# Patient Record
Sex: Female | Born: 1940 | Race: White | Hispanic: No | Smoking: Former smoker
Health system: Southern US, Community
[De-identification: ages and names within clinical notes are randomized; demographics above are authoritative.]

## PROBLEM LIST (undated history)

## (undated) DIAGNOSIS — G4700 Insomnia, unspecified: Secondary | ICD-10-CM

## (undated) DIAGNOSIS — K5792 Diverticulitis of intestine, part unspecified, without perforation or abscess without bleeding: Secondary | ICD-10-CM

## (undated) DIAGNOSIS — K219 Gastro-esophageal reflux disease without esophagitis: Secondary | ICD-10-CM

## (undated) DIAGNOSIS — I214 Non-ST elevation (NSTEMI) myocardial infarction: Secondary | ICD-10-CM

## (undated) DIAGNOSIS — E119 Type 2 diabetes mellitus without complications: Secondary | ICD-10-CM

## (undated) DIAGNOSIS — I878 Other specified disorders of veins: Secondary | ICD-10-CM

## (undated) DIAGNOSIS — K589 Irritable bowel syndrome without diarrhea: Secondary | ICD-10-CM

## (undated) DIAGNOSIS — I2510 Atherosclerotic heart disease of native coronary artery without angina pectoris: Secondary | ICD-10-CM

## (undated) DIAGNOSIS — Z789 Other specified health status: Secondary | ICD-10-CM

## (undated) DIAGNOSIS — Z720 Tobacco use: Secondary | ICD-10-CM

## (undated) DIAGNOSIS — R112 Nausea with vomiting, unspecified: Secondary | ICD-10-CM

## (undated) DIAGNOSIS — F32A Depression, unspecified: Secondary | ICD-10-CM

## (undated) DIAGNOSIS — I4891 Unspecified atrial fibrillation: Secondary | ICD-10-CM

## (undated) DIAGNOSIS — I10 Essential (primary) hypertension: Secondary | ICD-10-CM

## (undated) DIAGNOSIS — R197 Diarrhea, unspecified: Secondary | ICD-10-CM

## (undated) DIAGNOSIS — G4730 Sleep apnea, unspecified: Secondary | ICD-10-CM

## (undated) DIAGNOSIS — E039 Hypothyroidism, unspecified: Secondary | ICD-10-CM

## (undated) DIAGNOSIS — M25562 Pain in left knee: Secondary | ICD-10-CM

## (undated) DIAGNOSIS — R600 Localized edema: Secondary | ICD-10-CM

## (undated) DIAGNOSIS — Z87442 Personal history of urinary calculi: Secondary | ICD-10-CM

## (undated) DIAGNOSIS — R609 Edema, unspecified: Secondary | ICD-10-CM

## (undated) DIAGNOSIS — I2699 Other pulmonary embolism without acute cor pulmonale: Secondary | ICD-10-CM

## (undated) DIAGNOSIS — M1990 Unspecified osteoarthritis, unspecified site: Secondary | ICD-10-CM

## (undated) DIAGNOSIS — Z7901 Long term (current) use of anticoagulants: Secondary | ICD-10-CM

## (undated) DIAGNOSIS — T7840XA Allergy, unspecified, initial encounter: Secondary | ICD-10-CM

## (undated) DIAGNOSIS — Z9889 Other specified postprocedural states: Secondary | ICD-10-CM

## (undated) DIAGNOSIS — E079 Disorder of thyroid, unspecified: Secondary | ICD-10-CM

## (undated) DIAGNOSIS — E785 Hyperlipidemia, unspecified: Secondary | ICD-10-CM

## (undated) DIAGNOSIS — R110 Nausea: Secondary | ICD-10-CM

## (undated) DIAGNOSIS — Z7952 Long term (current) use of systemic steroids: Secondary | ICD-10-CM

## (undated) DIAGNOSIS — K625 Hemorrhage of anus and rectum: Secondary | ICD-10-CM

## (undated) DIAGNOSIS — I219 Acute myocardial infarction, unspecified: Secondary | ICD-10-CM

## (undated) DIAGNOSIS — I480 Paroxysmal atrial fibrillation: Secondary | ICD-10-CM

## (undated) DIAGNOSIS — Z8371 Family history of colonic polyps: Secondary | ICD-10-CM

## (undated) DIAGNOSIS — F329 Major depressive disorder, single episode, unspecified: Secondary | ICD-10-CM

## (undated) DIAGNOSIS — G8929 Other chronic pain: Secondary | ICD-10-CM

## (undated) DIAGNOSIS — Z83719 Family history of colon polyps, unspecified: Secondary | ICD-10-CM

## (undated) HISTORY — DX: Long term (current) use of anticoagulants: Z79.01

## (undated) HISTORY — PX: CHOLECYSTECTOMY: SHX55

## (undated) HISTORY — DX: Localized edema: R60.0

## (undated) HISTORY — DX: Other pulmonary embolism without acute cor pulmonale: I26.99

## (undated) HISTORY — DX: Allergy, unspecified, initial encounter: T78.40XA

## (undated) HISTORY — PX: BREAST SURGERY: SHX581

## (undated) HISTORY — DX: Paroxysmal atrial fibrillation: I48.0

## (undated) HISTORY — PX: ABDOMINAL HYSTERECTOMY: SHX81

## (undated) HISTORY — DX: Family history of colonic polyps: Z83.71

## (undated) HISTORY — DX: Other specified health status: Z78.9

## (undated) HISTORY — PX: BREAST REDUCTION SURGERY: SHX8

## (undated) HISTORY — DX: Hypothyroidism, unspecified: E03.9

## (undated) HISTORY — PX: KNEE SURGERY: SHX244

## (undated) HISTORY — DX: Essential (primary) hypertension: I10

## (undated) HISTORY — DX: Unspecified atrial fibrillation: I48.91

## (undated) HISTORY — PX: APPENDECTOMY: SHX54

## (undated) HISTORY — DX: Diarrhea, unspecified: R19.7

## (undated) HISTORY — DX: Non-ST elevation (NSTEMI) myocardial infarction: I21.4

## (undated) HISTORY — DX: Gastro-esophageal reflux disease without esophagitis: K21.9

## (undated) HISTORY — DX: Irritable bowel syndrome, unspecified: K58.9

## (undated) HISTORY — DX: Other specified disorders of veins: I87.8

## (undated) HISTORY — DX: Tobacco use: Z72.0

## (undated) HISTORY — PX: OOPHORECTOMY: SHX86

## (undated) HISTORY — DX: Edema, unspecified: R60.9

## (undated) HISTORY — DX: Family history of colon polyps, unspecified: Z83.719

## (undated) HISTORY — DX: Hemorrhage of anus and rectum: K62.5

## (undated) HISTORY — DX: Disorder of thyroid, unspecified: E07.9

## (undated) HISTORY — DX: Hyperlipidemia, unspecified: E78.5

## (undated) HISTORY — DX: Atherosclerotic heart disease of native coronary artery without angina pectoris: I25.10

## (undated) HISTORY — DX: Insomnia, unspecified: G47.00

## (undated) HISTORY — DX: Type 2 diabetes mellitus without complications: E11.9

## (undated) HISTORY — PX: TONSILLECTOMY: SUR1361

## (undated) HISTORY — PX: HEEL SPUR SURGERY: SHX665

## (undated) HISTORY — PX: CLEFT PALATE REPAIR: SUR1165

---

## 1998-03-24 ENCOUNTER — Ambulatory Visit: Admission: RE | Admit: 1998-03-24 | Discharge: 1998-03-24 | Payer: Self-pay | Admitting: Family Medicine

## 1998-07-17 ENCOUNTER — Ambulatory Visit: Admission: RE | Admit: 1998-07-17 | Discharge: 1998-07-17 | Payer: Self-pay | Admitting: Family Medicine

## 1998-08-21 ENCOUNTER — Ambulatory Visit (HOSPITAL_BASED_OUTPATIENT_CLINIC_OR_DEPARTMENT_OTHER): Admission: RE | Admit: 1998-08-21 | Discharge: 1998-08-21 | Payer: Self-pay | Admitting: Specialist

## 2000-03-20 ENCOUNTER — Other Ambulatory Visit: Admission: RE | Admit: 2000-03-20 | Discharge: 2000-03-20 | Payer: Self-pay

## 2000-12-25 ENCOUNTER — Encounter: Payer: Self-pay | Admitting: *Deleted

## 2000-12-25 ENCOUNTER — Emergency Department (HOSPITAL_COMMUNITY): Admission: EM | Admit: 2000-12-25 | Discharge: 2000-12-25 | Payer: Self-pay | Admitting: *Deleted

## 2000-12-26 ENCOUNTER — Inpatient Hospital Stay (HOSPITAL_COMMUNITY): Admission: AD | Admit: 2000-12-26 | Discharge: 2000-12-30 | Payer: Self-pay | Admitting: Family Medicine

## 2001-07-08 ENCOUNTER — Encounter: Payer: Self-pay | Admitting: Family Medicine

## 2001-07-08 ENCOUNTER — Ambulatory Visit (HOSPITAL_COMMUNITY): Admission: RE | Admit: 2001-07-08 | Discharge: 2001-07-08 | Payer: Self-pay | Admitting: Family Medicine

## 2001-09-11 ENCOUNTER — Ambulatory Visit (HOSPITAL_COMMUNITY): Admission: RE | Admit: 2001-09-11 | Discharge: 2001-09-11 | Payer: Self-pay | Admitting: Orthopedic Surgery

## 2001-11-02 ENCOUNTER — Encounter (HOSPITAL_COMMUNITY): Admission: RE | Admit: 2001-11-02 | Discharge: 2001-12-02 | Payer: Self-pay | Admitting: Orthopedic Surgery

## 2002-02-26 ENCOUNTER — Encounter: Admission: RE | Admit: 2002-02-26 | Discharge: 2002-02-26 | Payer: Self-pay

## 2002-07-02 ENCOUNTER — Encounter (HOSPITAL_COMMUNITY): Admission: RE | Admit: 2002-07-02 | Discharge: 2002-08-01 | Payer: Self-pay | Admitting: Family Medicine

## 2002-07-05 ENCOUNTER — Encounter: Payer: Self-pay | Admitting: Family Medicine

## 2002-07-09 ENCOUNTER — Inpatient Hospital Stay (HOSPITAL_COMMUNITY): Admission: EM | Admit: 2002-07-09 | Discharge: 2002-07-17 | Payer: Self-pay | Admitting: Emergency Medicine

## 2002-07-09 ENCOUNTER — Encounter: Payer: Self-pay | Admitting: Emergency Medicine

## 2002-09-16 ENCOUNTER — Ambulatory Visit (HOSPITAL_COMMUNITY): Admission: RE | Admit: 2002-09-16 | Discharge: 2002-09-16 | Payer: Self-pay | Admitting: Internal Medicine

## 2002-10-26 ENCOUNTER — Encounter (HOSPITAL_COMMUNITY): Admission: RE | Admit: 2002-10-26 | Discharge: 2002-11-25 | Payer: Self-pay | Admitting: Cardiology

## 2002-11-22 ENCOUNTER — Emergency Department (HOSPITAL_COMMUNITY): Admission: EM | Admit: 2002-11-22 | Discharge: 2002-11-22 | Payer: Self-pay | Admitting: Emergency Medicine

## 2002-11-29 ENCOUNTER — Inpatient Hospital Stay (HOSPITAL_COMMUNITY): Admission: AD | Admit: 2002-11-29 | Discharge: 2002-12-03 | Payer: Self-pay | Admitting: Family Medicine

## 2002-11-29 ENCOUNTER — Encounter: Payer: Self-pay | Admitting: Family Medicine

## 2003-03-04 ENCOUNTER — Encounter: Admission: RE | Admit: 2003-03-04 | Discharge: 2003-03-04 | Payer: Self-pay

## 2003-09-05 ENCOUNTER — Inpatient Hospital Stay (HOSPITAL_COMMUNITY): Admission: EM | Admit: 2003-09-05 | Discharge: 2003-09-09 | Payer: Self-pay | Admitting: Emergency Medicine

## 2003-09-09 ENCOUNTER — Encounter: Payer: Self-pay | Admitting: *Deleted

## 2003-10-23 ENCOUNTER — Inpatient Hospital Stay (HOSPITAL_COMMUNITY): Admission: EM | Admit: 2003-10-23 | Discharge: 2003-10-24 | Payer: Self-pay

## 2003-10-26 ENCOUNTER — Ambulatory Visit (HOSPITAL_COMMUNITY): Admission: RE | Admit: 2003-10-26 | Discharge: 2003-10-26 | Payer: Self-pay | Admitting: *Deleted

## 2003-11-12 ENCOUNTER — Emergency Department (HOSPITAL_COMMUNITY): Admission: EM | Admit: 2003-11-12 | Discharge: 2003-11-12 | Payer: Self-pay | Admitting: Emergency Medicine

## 2003-11-21 ENCOUNTER — Encounter (HOSPITAL_COMMUNITY): Admission: RE | Admit: 2003-11-21 | Discharge: 2003-12-21 | Payer: Self-pay | Admitting: *Deleted

## 2003-12-23 ENCOUNTER — Encounter (HOSPITAL_COMMUNITY): Admission: RE | Admit: 2003-12-23 | Discharge: 2004-01-22 | Payer: Self-pay | Admitting: *Deleted

## 2004-01-23 ENCOUNTER — Encounter (HOSPITAL_COMMUNITY): Admission: RE | Admit: 2004-01-23 | Discharge: 2004-02-22 | Payer: Self-pay | Admitting: *Deleted

## 2004-03-09 ENCOUNTER — Ambulatory Visit (HOSPITAL_COMMUNITY): Admission: RE | Admit: 2004-03-09 | Discharge: 2004-03-09 | Payer: Self-pay | Admitting: Internal Medicine

## 2004-05-03 ENCOUNTER — Emergency Department (HOSPITAL_COMMUNITY): Admission: EM | Admit: 2004-05-03 | Discharge: 2004-05-03 | Payer: Self-pay | Admitting: Emergency Medicine

## 2004-05-07 ENCOUNTER — Ambulatory Visit: Payer: Self-pay | Admitting: *Deleted

## 2004-05-15 ENCOUNTER — Ambulatory Visit: Payer: Self-pay | Admitting: *Deleted

## 2004-05-21 ENCOUNTER — Ambulatory Visit: Payer: Self-pay | Admitting: Internal Medicine

## 2004-05-21 ENCOUNTER — Ambulatory Visit (HOSPITAL_COMMUNITY): Admission: RE | Admit: 2004-05-21 | Discharge: 2004-05-21 | Payer: Self-pay | Admitting: Internal Medicine

## 2004-05-31 ENCOUNTER — Ambulatory Visit: Payer: Self-pay | Admitting: *Deleted

## 2004-06-05 ENCOUNTER — Ambulatory Visit: Payer: Self-pay | Admitting: Cardiology

## 2004-06-08 ENCOUNTER — Ambulatory Visit: Payer: Self-pay | Admitting: Cardiology

## 2004-06-14 ENCOUNTER — Ambulatory Visit: Payer: Self-pay | Admitting: *Deleted

## 2004-06-15 ENCOUNTER — Ambulatory Visit (HOSPITAL_COMMUNITY): Admission: RE | Admit: 2004-06-15 | Discharge: 2004-06-15 | Payer: Self-pay | Admitting: Family Medicine

## 2004-06-28 ENCOUNTER — Ambulatory Visit: Payer: Self-pay | Admitting: *Deleted

## 2004-07-09 ENCOUNTER — Ambulatory Visit: Payer: Self-pay | Admitting: Internal Medicine

## 2004-07-23 ENCOUNTER — Ambulatory Visit: Payer: Self-pay | Admitting: *Deleted

## 2004-08-27 ENCOUNTER — Ambulatory Visit: Payer: Self-pay | Admitting: *Deleted

## 2004-10-01 ENCOUNTER — Ambulatory Visit: Payer: Self-pay | Admitting: *Deleted

## 2004-10-01 ENCOUNTER — Ambulatory Visit (HOSPITAL_COMMUNITY): Admission: RE | Admit: 2004-10-01 | Discharge: 2004-10-01 | Payer: Self-pay | Admitting: Family Medicine

## 2004-10-10 ENCOUNTER — Ambulatory Visit (HOSPITAL_COMMUNITY): Admission: RE | Admit: 2004-10-10 | Discharge: 2004-10-10 | Payer: Self-pay | Admitting: Family Medicine

## 2004-10-15 ENCOUNTER — Ambulatory Visit: Payer: Self-pay | Admitting: Cardiology

## 2004-10-23 ENCOUNTER — Ambulatory Visit: Payer: Self-pay | Admitting: *Deleted

## 2004-10-23 ENCOUNTER — Ambulatory Visit (HOSPITAL_COMMUNITY): Admission: RE | Admit: 2004-10-23 | Discharge: 2004-10-23 | Payer: Self-pay | Admitting: Family Medicine

## 2004-10-29 ENCOUNTER — Ambulatory Visit: Payer: Self-pay | Admitting: *Deleted

## 2004-11-09 ENCOUNTER — Ambulatory Visit: Payer: Self-pay | Admitting: *Deleted

## 2004-11-23 ENCOUNTER — Ambulatory Visit: Payer: Self-pay | Admitting: *Deleted

## 2004-12-21 ENCOUNTER — Ambulatory Visit: Payer: Self-pay | Admitting: Cardiovascular Disease

## 2004-12-27 ENCOUNTER — Ambulatory Visit: Payer: Self-pay | Admitting: *Deleted

## 2005-01-03 ENCOUNTER — Ambulatory Visit: Payer: Self-pay | Admitting: *Deleted

## 2005-01-21 ENCOUNTER — Ambulatory Visit: Payer: Self-pay | Admitting: Cardiology

## 2005-02-12 ENCOUNTER — Ambulatory Visit: Payer: Self-pay | Admitting: Cardiology

## 2005-03-04 ENCOUNTER — Ambulatory Visit: Payer: Self-pay | Admitting: *Deleted

## 2005-03-27 ENCOUNTER — Ambulatory Visit: Payer: Self-pay | Admitting: Cardiology

## 2005-04-24 ENCOUNTER — Ambulatory Visit: Payer: Self-pay | Admitting: *Deleted

## 2005-05-29 ENCOUNTER — Ambulatory Visit: Payer: Self-pay | Admitting: Cardiovascular Disease

## 2005-07-01 ENCOUNTER — Ambulatory Visit: Payer: Self-pay | Admitting: *Deleted

## 2005-07-16 ENCOUNTER — Ambulatory Visit: Payer: Self-pay | Admitting: *Deleted

## 2005-08-05 ENCOUNTER — Ambulatory Visit: Payer: Self-pay | Admitting: *Deleted

## 2005-09-02 ENCOUNTER — Ambulatory Visit: Payer: Self-pay | Admitting: Cardiology

## 2005-09-17 ENCOUNTER — Ambulatory Visit: Payer: Self-pay | Admitting: *Deleted

## 2005-10-21 ENCOUNTER — Ambulatory Visit: Payer: Self-pay | Admitting: Cardiology

## 2005-11-12 ENCOUNTER — Encounter: Admission: RE | Admit: 2005-11-12 | Discharge: 2005-11-12 | Payer: Self-pay | Admitting: Family Medicine

## 2005-11-22 ENCOUNTER — Ambulatory Visit: Payer: Self-pay | Admitting: *Deleted

## 2005-12-23 ENCOUNTER — Ambulatory Visit: Payer: Self-pay | Admitting: *Deleted

## 2006-01-09 ENCOUNTER — Ambulatory Visit: Payer: Self-pay | Admitting: Internal Medicine

## 2006-01-23 ENCOUNTER — Ambulatory Visit: Payer: Self-pay | Admitting: *Deleted

## 2006-02-03 ENCOUNTER — Ambulatory Visit: Payer: Self-pay | Admitting: Internal Medicine

## 2006-02-03 ENCOUNTER — Ambulatory Visit (HOSPITAL_COMMUNITY): Admission: RE | Admit: 2006-02-03 | Discharge: 2006-02-03 | Payer: Self-pay | Admitting: Internal Medicine

## 2006-02-27 ENCOUNTER — Ambulatory Visit: Payer: Self-pay | Admitting: Cardiology

## 2006-03-24 ENCOUNTER — Ambulatory Visit: Payer: Self-pay | Admitting: Cardiology

## 2006-10-21 ENCOUNTER — Emergency Department (HOSPITAL_COMMUNITY): Admission: EM | Admit: 2006-10-21 | Discharge: 2006-10-21 | Payer: Self-pay | Admitting: Emergency Medicine

## 2007-01-16 ENCOUNTER — Emergency Department (HOSPITAL_COMMUNITY): Admission: EM | Admit: 2007-01-16 | Discharge: 2007-01-16 | Payer: Self-pay | Admitting: Emergency Medicine

## 2007-01-23 ENCOUNTER — Ambulatory Visit (HOSPITAL_COMMUNITY): Admission: RE | Admit: 2007-01-23 | Discharge: 2007-01-23 | Payer: Self-pay | Admitting: Family Medicine

## 2007-10-07 ENCOUNTER — Emergency Department (HOSPITAL_COMMUNITY): Admission: EM | Admit: 2007-10-07 | Discharge: 2007-10-07 | Payer: Self-pay | Admitting: Emergency Medicine

## 2007-10-12 ENCOUNTER — Ambulatory Visit: Payer: Self-pay | Admitting: Cardiology

## 2007-10-13 ENCOUNTER — Ambulatory Visit: Admission: RE | Admit: 2007-10-13 | Discharge: 2007-10-13 | Payer: Self-pay | Admitting: Family Medicine

## 2008-01-04 ENCOUNTER — Inpatient Hospital Stay (HOSPITAL_COMMUNITY): Admission: AD | Admit: 2008-01-04 | Discharge: 2008-01-09 | Payer: Self-pay | Admitting: Family Medicine

## 2008-03-12 ENCOUNTER — Inpatient Hospital Stay (HOSPITAL_COMMUNITY): Admission: RE | Admit: 2008-03-12 | Discharge: 2008-03-15 | Payer: Self-pay | Admitting: Family Medicine

## 2008-03-16 ENCOUNTER — Inpatient Hospital Stay (HOSPITAL_COMMUNITY): Admission: EM | Admit: 2008-03-16 | Discharge: 2008-03-17 | Payer: Self-pay | Admitting: Emergency Medicine

## 2008-03-16 ENCOUNTER — Ambulatory Visit: Payer: Self-pay | Admitting: Cardiology

## 2008-03-16 ENCOUNTER — Encounter: Payer: Self-pay | Admitting: Family Medicine

## 2008-03-29 ENCOUNTER — Ambulatory Visit: Payer: Self-pay | Admitting: Cardiology

## 2008-04-25 ENCOUNTER — Ambulatory Visit: Payer: Self-pay | Admitting: Cardiology

## 2008-06-13 ENCOUNTER — Ambulatory Visit: Payer: Self-pay | Admitting: Cardiology

## 2008-06-20 ENCOUNTER — Ambulatory Visit: Payer: Self-pay | Admitting: Cardiology

## 2008-06-21 ENCOUNTER — Ambulatory Visit (HOSPITAL_COMMUNITY): Admission: RE | Admit: 2008-06-21 | Discharge: 2008-06-21 | Payer: Self-pay | Admitting: Cardiology

## 2008-07-18 ENCOUNTER — Ambulatory Visit: Payer: Self-pay | Admitting: Cardiology

## 2008-09-09 ENCOUNTER — Ambulatory Visit (HOSPITAL_COMMUNITY): Admission: RE | Admit: 2008-09-09 | Discharge: 2008-09-09 | Payer: Self-pay | Admitting: Rheumatology

## 2008-09-14 ENCOUNTER — Inpatient Hospital Stay (HOSPITAL_COMMUNITY): Admission: EM | Admit: 2008-09-14 | Discharge: 2008-09-16 | Payer: Self-pay | Admitting: Emergency Medicine

## 2008-09-14 ENCOUNTER — Ambulatory Visit: Payer: Self-pay | Admitting: Cardiology

## 2008-10-03 ENCOUNTER — Ambulatory Visit (HOSPITAL_COMMUNITY): Admission: RE | Admit: 2008-10-03 | Discharge: 2008-10-03 | Payer: Self-pay | Admitting: Rheumatology

## 2008-10-10 ENCOUNTER — Ambulatory Visit: Payer: Self-pay | Admitting: Cardiology

## 2008-10-12 ENCOUNTER — Ambulatory Visit (HOSPITAL_BASED_OUTPATIENT_CLINIC_OR_DEPARTMENT_OTHER): Admission: RE | Admit: 2008-10-12 | Discharge: 2008-10-12 | Payer: Self-pay | Admitting: General Surgery

## 2008-10-12 ENCOUNTER — Encounter (INDEPENDENT_AMBULATORY_CARE_PROVIDER_SITE_OTHER): Payer: Self-pay | Admitting: General Surgery

## 2008-10-17 ENCOUNTER — Inpatient Hospital Stay (HOSPITAL_COMMUNITY): Admission: AD | Admit: 2008-10-17 | Discharge: 2008-10-23 | Payer: Self-pay | Admitting: Family Medicine

## 2008-10-17 ENCOUNTER — Ambulatory Visit: Payer: Self-pay | Admitting: Cardiology

## 2008-10-18 ENCOUNTER — Encounter: Payer: Self-pay | Admitting: Cardiology

## 2008-10-19 ENCOUNTER — Ambulatory Visit: Payer: Self-pay | Admitting: Gastroenterology

## 2008-10-20 ENCOUNTER — Ambulatory Visit: Payer: Self-pay | Admitting: Internal Medicine

## 2008-10-22 ENCOUNTER — Ambulatory Visit: Payer: Self-pay | Admitting: Gastroenterology

## 2008-11-22 ENCOUNTER — Encounter: Payer: Self-pay | Admitting: Gastroenterology

## 2008-12-02 ENCOUNTER — Encounter: Payer: Self-pay | Admitting: Physician Assistant

## 2008-12-02 ENCOUNTER — Ambulatory Visit: Payer: Self-pay | Admitting: Cardiology

## 2008-12-02 DIAGNOSIS — I482 Chronic atrial fibrillation, unspecified: Secondary | ICD-10-CM | POA: Insufficient documentation

## 2008-12-02 DIAGNOSIS — E119 Type 2 diabetes mellitus without complications: Secondary | ICD-10-CM | POA: Insufficient documentation

## 2008-12-02 DIAGNOSIS — I2510 Atherosclerotic heart disease of native coronary artery without angina pectoris: Secondary | ICD-10-CM | POA: Insufficient documentation

## 2008-12-02 DIAGNOSIS — I10 Essential (primary) hypertension: Secondary | ICD-10-CM | POA: Insufficient documentation

## 2008-12-02 DIAGNOSIS — T7840XA Allergy, unspecified, initial encounter: Secondary | ICD-10-CM | POA: Insufficient documentation

## 2008-12-02 DIAGNOSIS — E785 Hyperlipidemia, unspecified: Secondary | ICD-10-CM | POA: Insufficient documentation

## 2008-12-02 DIAGNOSIS — K219 Gastro-esophageal reflux disease without esophagitis: Secondary | ICD-10-CM | POA: Insufficient documentation

## 2008-12-02 DIAGNOSIS — E039 Hypothyroidism, unspecified: Secondary | ICD-10-CM | POA: Insufficient documentation

## 2008-12-02 DIAGNOSIS — Z87891 Personal history of nicotine dependence: Secondary | ICD-10-CM | POA: Insufficient documentation

## 2008-12-02 DIAGNOSIS — J45909 Unspecified asthma, uncomplicated: Secondary | ICD-10-CM | POA: Insufficient documentation

## 2008-12-05 ENCOUNTER — Encounter: Payer: Self-pay | Admitting: Gastroenterology

## 2008-12-05 ENCOUNTER — Ambulatory Visit: Payer: Self-pay | Admitting: Gastroenterology

## 2008-12-05 DIAGNOSIS — R197 Diarrhea, unspecified: Secondary | ICD-10-CM | POA: Insufficient documentation

## 2008-12-05 DIAGNOSIS — D126 Benign neoplasm of colon, unspecified: Secondary | ICD-10-CM | POA: Insufficient documentation

## 2008-12-05 DIAGNOSIS — R109 Unspecified abdominal pain: Secondary | ICD-10-CM | POA: Insufficient documentation

## 2008-12-05 DIAGNOSIS — K222 Esophageal obstruction: Secondary | ICD-10-CM | POA: Insufficient documentation

## 2008-12-05 DIAGNOSIS — K625 Hemorrhage of anus and rectum: Secondary | ICD-10-CM | POA: Insufficient documentation

## 2008-12-07 ENCOUNTER — Ambulatory Visit: Payer: Self-pay | Admitting: Cardiology

## 2008-12-08 ENCOUNTER — Encounter: Payer: Self-pay | Admitting: Gastroenterology

## 2008-12-09 LAB — CONVERTED CEMR LAB
Hemoglobin: 11.6 g/dL — ABNORMAL LOW (ref 12.0–15.0)
Lymphocytes Relative: 16 % (ref 12–46)
Lymphs Abs: 2.1 10*3/uL (ref 0.7–4.0)
Monocytes Absolute: 0.9 10*3/uL (ref 0.1–1.0)
Monocytes Relative: 7 % (ref 3–12)
Neutro Abs: 10.2 10*3/uL — ABNORMAL HIGH (ref 1.7–7.7)
RBC: 4.14 M/uL (ref 3.87–5.11)

## 2008-12-16 ENCOUNTER — Encounter: Payer: Self-pay | Admitting: Gastroenterology

## 2009-03-15 ENCOUNTER — Encounter (INDEPENDENT_AMBULATORY_CARE_PROVIDER_SITE_OTHER): Payer: Self-pay | Admitting: *Deleted

## 2009-03-15 LAB — CONVERTED CEMR LAB
HCT: 39 %
Hemoglobin: 11.4 g/dL
MCV: 92 fL
TSH: 0.866 microintl units/mL
WBC: 13.5 10*3/uL

## 2009-04-07 ENCOUNTER — Ambulatory Visit: Payer: Self-pay | Admitting: Cardiology

## 2009-04-07 ENCOUNTER — Encounter (INDEPENDENT_AMBULATORY_CARE_PROVIDER_SITE_OTHER): Payer: Self-pay | Admitting: *Deleted

## 2009-04-07 DIAGNOSIS — I2699 Other pulmonary embolism without acute cor pulmonale: Secondary | ICD-10-CM | POA: Insufficient documentation

## 2009-04-11 ENCOUNTER — Ambulatory Visit (HOSPITAL_COMMUNITY): Admission: RE | Admit: 2009-04-11 | Discharge: 2009-04-11 | Payer: Self-pay | Admitting: Cardiology

## 2009-04-14 ENCOUNTER — Encounter (INDEPENDENT_AMBULATORY_CARE_PROVIDER_SITE_OTHER): Payer: Self-pay | Admitting: *Deleted

## 2009-04-24 ENCOUNTER — Telehealth: Payer: Self-pay | Admitting: Cardiology

## 2009-04-26 ENCOUNTER — Encounter (HOSPITAL_COMMUNITY): Admission: RE | Admit: 2009-04-26 | Discharge: 2009-05-26 | Payer: Self-pay | Admitting: Oncology

## 2009-04-26 ENCOUNTER — Ambulatory Visit (HOSPITAL_COMMUNITY): Payer: Self-pay | Admitting: Oncology

## 2009-05-03 ENCOUNTER — Encounter: Admission: RE | Admit: 2009-05-03 | Discharge: 2009-05-03 | Payer: Self-pay | Admitting: Oncology

## 2009-06-24 HISTORY — PX: COLONOSCOPY: SHX174

## 2009-06-27 ENCOUNTER — Ambulatory Visit (HOSPITAL_COMMUNITY): Admission: RE | Admit: 2009-06-27 | Discharge: 2009-06-27 | Payer: Self-pay | Admitting: Family Medicine

## 2009-07-05 ENCOUNTER — Encounter (HOSPITAL_COMMUNITY): Admission: RE | Admit: 2009-07-05 | Discharge: 2009-08-04 | Payer: Self-pay | Admitting: Oncology

## 2009-07-06 ENCOUNTER — Ambulatory Visit (HOSPITAL_COMMUNITY): Payer: Self-pay | Admitting: Oncology

## 2009-10-03 ENCOUNTER — Emergency Department (HOSPITAL_COMMUNITY): Admission: EM | Admit: 2009-10-03 | Discharge: 2009-10-03 | Payer: Self-pay | Admitting: Emergency Medicine

## 2009-11-13 ENCOUNTER — Encounter (INDEPENDENT_AMBULATORY_CARE_PROVIDER_SITE_OTHER): Payer: Self-pay | Admitting: *Deleted

## 2009-11-13 LAB — CONVERTED CEMR LAB
AST: 16 units/L
BUN: 18 mg/dL
Calcium: 9.5 mg/dL
Chloride: 103 meq/L
Creatinine, Ser: 0.77 mg/dL
HDL: 61 mg/dL
MCV: 89.1 fL
Platelets: 332 10*3/uL
Triglycerides: 112 mg/dL
WBC: 13.3 10*3/uL

## 2009-12-04 ENCOUNTER — Encounter (INDEPENDENT_AMBULATORY_CARE_PROVIDER_SITE_OTHER): Payer: Self-pay | Admitting: *Deleted

## 2009-12-13 ENCOUNTER — Encounter (INDEPENDENT_AMBULATORY_CARE_PROVIDER_SITE_OTHER): Payer: Self-pay | Admitting: *Deleted

## 2009-12-13 ENCOUNTER — Ambulatory Visit: Payer: Self-pay | Admitting: Cardiology

## 2009-12-14 ENCOUNTER — Encounter: Payer: Self-pay | Admitting: Cardiology

## 2010-01-15 ENCOUNTER — Telehealth (INDEPENDENT_AMBULATORY_CARE_PROVIDER_SITE_OTHER): Payer: Self-pay | Admitting: *Deleted

## 2010-02-13 ENCOUNTER — Encounter (INDEPENDENT_AMBULATORY_CARE_PROVIDER_SITE_OTHER): Payer: Self-pay | Admitting: *Deleted

## 2010-02-13 LAB — CONVERTED CEMR LAB
Eosinophils Relative: 2 %
LDL Cholesterol: 88 mg/dL
Lymphocytes Relative: 22 %
Lymphs Abs: 2.6 10*3/uL
Monocytes Absolute: 0.8 10*3/uL
RBC: 4.55 M/uL
WBC: 11.8 10*3/uL

## 2010-02-14 ENCOUNTER — Encounter (INDEPENDENT_AMBULATORY_CARE_PROVIDER_SITE_OTHER): Payer: Self-pay | Admitting: *Deleted

## 2010-02-23 LAB — CONVERTED CEMR LAB
Basophils Absolute: 0 K/uL
Basophils Relative: 0 %
Cholesterol: 159 mg/dL
Eosinophils Absolute: 0.2 K/uL
Eosinophils Relative: 2 %
HCT: 38.1 %
HDL: 40 mg/dL
Hemoglobin: 11.3 g/dL — ABNORMAL LOW
LDL Cholesterol: 88 mg/dL
Lymphocytes Relative: 22 %
Lymphs Abs: 2.6 K/uL
MCHC: 29.7 g/dL — ABNORMAL LOW
MCV: 83.7 fL
Monocytes Absolute: 0.8 K/uL
Monocytes Relative: 6 %
Neutro Abs: 8.3 K/uL — ABNORMAL HIGH
Neutrophils Relative %: 70 %
Platelets: 470 K/uL — ABNORMAL HIGH
RBC: 4.55 M/uL
RDW: 17.4 % — ABNORMAL HIGH
Total CHOL/HDL Ratio: 4
Triglycerides: 155 mg/dL — ABNORMAL HIGH
VLDL: 31 mg/dL
WBC: 11.8 10*3/microliter — ABNORMAL HIGH

## 2010-06-26 ENCOUNTER — Ambulatory Visit
Admission: RE | Admit: 2010-06-26 | Discharge: 2010-06-26 | Payer: Self-pay | Source: Home / Self Care | Attending: Internal Medicine | Admitting: Internal Medicine

## 2010-07-14 ENCOUNTER — Encounter: Payer: Self-pay | Admitting: *Deleted

## 2010-07-15 ENCOUNTER — Encounter: Payer: Self-pay | Admitting: Family Medicine

## 2010-07-15 ENCOUNTER — Encounter: Payer: Self-pay | Admitting: *Deleted

## 2010-07-24 NOTE — Letter (Signed)
Summary: [facility], general practice at [place]. [address], [place] [address]   Phone: [phone]  Fax: [phone]     Eeva 22, 2013 MRN: [id number]   [facility] [place],   [id number]      YOUR LAB WORK IS DUE   February 12, 2010  Please go to [facility], located across the street from [facility] on the second floor.  Hours are Monday - Friday 7am until 7:30pm         Saturday 8am until 12noon    _X_  DO NOT EAT OR DRINK AFTER MIDNIGHT EVENING PRIOR TO LABWORK  __ YOUR LABWORK IS NOT FASTING --YOU MAY EAT PRIOR TO LABWORK

## 2010-07-24 NOTE — Miscellaneous (Signed)
Summary: labs cbcd,lipids,02/14/2010  Clinical Lists Changes  Observations: Added new observation of LDL: 88 mg/dL (02/13/2010 11:50) Added new observation of HDL: 4.0 mg/dL (02/13/2010 11:50) Added new observation of TRIGLYC TOT: 40 mg/dL (02/13/2010 11:50) Added new observation of CHOLESTEROL: 159 mg/dL (02/13/2010 11:50) Added new observation of ABSOLUTE BAS: 0.0 K/uL (02/13/2010 11:50) Added new observation of BASOPHIL %: 0 % (02/13/2010 11:50) Added new observation of EOS ABSLT: 0.2 K/uL (02/13/2010 11:50) Added new observation of % EOS AUTO: 2 % (02/13/2010 11:50) Added new observation of ABSOLUTE MON: 0.8 K/uL (02/13/2010 11:50) Added new observation of MONOCYTE %: 6 % (02/13/2010 11:50) Added new observation of ABS LYMPHOCY: 2.6 K/uL (02/13/2010 11:50) Added new observation of LYMPHS %: 22 % (02/13/2010 11:50) Added new observation of PLATELETK/UL: 470 K/uL (02/13/2010 11:50) Added new observation of RDW: 17.4 % (02/13/2010 11:50) Added new observation of MCHC RBC: 24.8 g/dL (02/13/2010 11:50) Added new observation of MCV: 83.7 fL (02/13/2010 11:50) Added new observation of HCT: 38.1 % (02/13/2010 11:50) Added new observation of HGB: 11.3 g/dL (02/13/2010 11:50) Added new observation of RBC M/UL: 4.55 M/uL (02/13/2010 11:50) Added new observation of WBC COUNT: 11.8 10*3/microliter (02/13/2010 11:50)

## 2010-07-24 NOTE — Progress Notes (Signed)
Summary: Slow Heart Rate   Phone Note Call from Patient   Caller: Patient Reason for Call: Talk to Nurse Summary of Call: pt states that last night her HR was 55 / pt has been feeling "bad" and very tired wants to sleep all the time / pls return phone call/tg Initial call taken by: [facility],  January 15, 2010 3:50 PM  Follow-up for Phone Call        S: pt hr has been 55,57,59 B: pt has polymyalgia, last seen 12/13/09, stopped vytorin and started pravastatin 40mg  daily A: pt has been feeling listless,tired and wants to sleep all the time     Appended Document: Slow Heart Rate Heart rate of 55 beats per minute is normal and should not cause any symptoms Patient's other symptoms and not clearly related to cardiology issues and would best be managed by her primary care physician.  Jacqulyn Ducking, M.D.  Appended Document: Slow Heart Rate I discussed Dr. Izell Hugo recommendations with pt, verbalized understanding

## 2010-07-24 NOTE — Miscellaneous (Signed)
Summary: carotid 04/11/2009  Clinical Lists Changes  Observations: Added new observation of DOPPLER LEG:  Clinical Data: History of left leg pain.  History of previous deep   venous thrombosis.    LEFT LOWER EXTREMITY VENOUS DUPLEX ULTRASOUND    Technique:  Gray-scale sonography with graded compression, as well   as color Doppler and duplex ultrasound were performed to evaluate   the deep venous system of the lower extremity from the level of the   common femoral vein through the popliteal and proximal calf veins.   Spectral Doppler was utilized to evaluate flow at rest and with   distal augmentation maneuvers.    Comparison:  10/19/2008.    Findings:  Normal compressibility of the common femoral,   superficial femoral, and popliteal veins is demonstrated, as well   as the visualized proximal calf veins.  No filling defects to   suggest DVT on grayscale or color Doppler imaging.  Doppler   waveforms show normal direction of venous flow, normal respiratory   phasicity and response to augmentation. No popliteal Baker's cyst   could be seen posterior to the knee.    IMPRESSION:   No evidence of left lower extremity deep vein thrombosis.  (10/03/2009 8:59) Added new observation of US CAROTID:   Clinical Data: Carotid bruit.  Diabetes, hypertension, coronary   stents.    BILATERAL CAROTID DUPLEX ULTRASOUND    Technique: Pearline Cables scale imaging, color Doppler and duplex ultrasound   was performed of bilateral carotid and vertebral arteries in the   neck.    Comparison: None    Criteria:  Quantification of carotid stenosis is based on velocity   parameters that correlate the residual internal carotid diameter   with NASCET-based stenosis levels, using the diameter of the distal   internal carotid lumen as the denominator for stenosis measurement.    The following velocity measurements were obtained:                     PEAK SYSTOLIC/END DIASTOLIC   RIGHT   ICA:                         68/21cm/sec   CCA:                        [id number]   SYSTOLIC ICA/CCA RATIO:     [id number]   DIASTOLIC ICA/CCA RATIO:    1.39   ECA:                        50/5cm/sec    LEFT   ICA:                        83/25cm/sec   CCA:                        [id number]   SYSTOLIC ICA/CCA RATIO:     [id number]   DIASTOLIC ICA/CCA RATIO:    1.65   ECA:                        56/10cm/sec    Findings:    RIGHT CAROTID ARTERY: There is partially calcified mild plaque at   the origin of the ICA without high-grade stenosis.  Normal wave   forms with no focal aliasing on color Doppler interrogation.  RIGHT VERTEBRAL ARTERY:  Normal flow direction and wave form.    LEFT CAROTID ARTERY: Minimal plaque in the carotid bulb without   stenosis.  Normal wave forms with no focal aliasing on color   Doppler interrogation.    LEFT VERTEBRAL ARTERY:  Normal flow direction and wave form.    IMPRESSION:    1.  Mild bilateral carotid bifurcation plaque resulting in less   than 50% diameter stenosis. The exam does not exclude plaque   ulceration or embolization.  Continued surveillance recommended.  (04/11/2009 8:58)      Carotid Doppler  Procedure date:  04/11/2009  Findings:        Clinical Data: Carotid bruit.  Diabetes, hypertension, coronary   stents.    BILATERAL CAROTID DUPLEX ULTRASOUND    Technique: Pearline Cables scale imaging, color Doppler and duplex ultrasound   was performed of bilateral carotid and vertebral arteries in the   neck.    Comparison: None    Criteria:  Quantification of carotid stenosis is based on velocity   parameters that correlate the residual internal carotid diameter   with NASCET-based stenosis levels, using the diameter of the distal   internal carotid lumen as the denominator for stenosis measurement.    The following velocity measurements were obtained:                     PEAK SYSTOLIC/END DIASTOLIC   RIGHT   ICA:                        68/21cm/sec   CCA:                         [id number]   SYSTOLIC ICA/CCA RATIO:     [id number]   DIASTOLIC ICA/CCA RATIO:    1.39   ECA:                        50/5cm/sec    LEFT   ICA:                        83/25cm/sec   CCA:                        [id number]   SYSTOLIC ICA/CCA RATIO:     [id number]   DIASTOLIC ICA/CCA RATIO:    1.65   ECA:                        56/10cm/sec    Findings:    RIGHT CAROTID ARTERY: There is partially calcified mild plaque at   the origin of the ICA without high-grade stenosis.  Normal wave   forms with no focal aliasing on color Doppler interrogation.    RIGHT VERTEBRAL ARTERY:  Normal flow direction and wave form.    LEFT CAROTID ARTERY: Minimal plaque in the carotid bulb without   stenosis.  Normal wave forms with no focal aliasing on color   Doppler interrogation.    LEFT VERTEBRAL ARTERY:  Normal flow direction and wave form.    IMPRESSION:    1.  Mild bilateral carotid bifurcation plaque resulting in less   than 50% diameter stenosis. The exam does not exclude plaque   ulceration or embolization.  Continued surveillance recommended.   Venous Doppler  Procedure date:  10/03/2009  Findings:  Clinical Data: History of left leg pain.  History of previous deep   venous thrombosis.    LEFT LOWER EXTREMITY VENOUS DUPLEX ULTRASOUND    Technique:  Gray-scale sonography with graded compression, as well   as color Doppler and duplex ultrasound were performed to evaluate   the deep venous system of the lower extremity from the level of the   common femoral vein through the popliteal and proximal calf veins.   Spectral Doppler was utilized to evaluate flow at rest and with   distal augmentation maneuvers.    Comparison:  10/19/2008.    Findings:  Normal compressibility of the common femoral,   superficial femoral, and popliteal veins is demonstrated, as well   as the visualized proximal calf veins.  No filling defects to   suggest DVT on grayscale or color Doppler  imaging.  Doppler   waveforms show normal direction of venous flow, normal respiratory   phasicity and response to augmentation. No popliteal Baker's cyst   could be seen posterior to the knee.    IMPRESSION:   No evidence of left lower extremity deep vein thrombosis.

## 2010-07-24 NOTE — Assessment & Plan Note (Signed)
Summary: 8 mth f/u per checkout on 04/07/09/tg  Medications Added FUROSEMIDE 40 MG TABS (FUROSEMIDE) take 1 tab daily PREDNISONE 10 MG TABS (PREDNISONE) take 1 tab daily PRAVASTATIN SODIUM 40 MG TABS (PRAVASTATIN SODIUM) Take one tablet by mouth daily at bedtime      Allergies Added: NKDA  Visit Type:  Follow-up Primary Provider:  Mickie Hillier   History of Present Illness: [name] returns to the office as scheduled for continued assessment and treatment of paroxysmal atrial fibrillation, hypertension and hyperlipidemia and known coronary artery disease, having required percutaneous intervention for single vessel disease following myocardial infarction some years ago.  She has done well symptomatically, experiencing no chest discomfort, no dyspnea on exertion, no orthopnea and no PND.  She has occasional minor palpitations, particularly at night after retiring to bed.  She complains of chronic fatigue and sleep disturbance.  EKG  Procedure date:  12/13/2009  Findings:      Rhythm Strip      Probable sinus rhythm with sinus arrhythmia and perhaps PACs.  EKG: normal sinus rhythm; right ventricular conduction delay; delayed R wave progression-cannot exclude previous septal myocardial infarction; otherwise unremarkable.  Comparison with prior tracing of 12/02/08, no significant interval change.  Colonoscopy  Procedure date:  2011  Findings:      Patient reports no pathologic findings.  She underwent upper endoscopy at the same time and required esophageal dilatation.   Current Medications (verified): 1)  Levothroid 50 Mcg Tabs (Levothyroxine Sodium) .... Once Daily 2)  Singulair 10 Mg Tabs (Montelukast Sodium) .... Once Daily 3)  Diltiazem Hcl Er Beads 300 Mg Xr24h-Cap (Diltiazem Hcl Er Beads) .Marland Kitchen.. 1 Tab Daily 4)  Digoxin 0.25 Mg Tabs (Digoxin) .Marland Kitchen.. 1 Tab Daily 5)  Furosemide 40 Mg Tabs (Furosemide) .... Take 1 Tab Daily 6)  Coumadin 5 Mg Tabs (Warfarin Sodium) .... 5mg   Sat/mon, 2.5mg  Other Days 7)  Diovan 320 Mg Tabs (Valsartan) .Marland Kitchen.. 1 Daily 8)  Vitamin D 400 Unit Tabs (Cholecalciferol) .... Weekly 9)  Prednisone 10 Mg Tabs (Prednisone) .... Take 1 Tab Daily 10)  Flovent Hfa 44 Mcg/act Aero (Fluticasone Propionate  Hfa) .... 2 Puffs Two Times A Day 11)  Potassium Chloride 20 Meq/68ml (10%) Liqd (Potassium Chloride) .Marland Kitchen.. 1 Tab Daily 12)  Pravastatin Sodium 40 Mg Tabs (Pravastatin Sodium) .... Take One Tablet By Mouth Daily At Bedtime  Allergies (verified): No Known Drug Allergies  Past History:  PMH, FH, and Social History reviewed and updated.  Past Medical History: Paroxysmal FIBRILLATION, ATRIAL (ICD-427.31): Normal LV function; episodes occurred in 2005 and 09/2007 ASCVD: Stent to the mid and proximal left anterior descending in 06/2002; Drug-eluting stent placed         in the second diagonal in 08/2003 after a non-ST elevation myocardial infarction TOBACCO USE, QUIT (ICD-V15.82) HYPERLIPIDEMIA (ICD-272.4) Pulmonary embolism-2000 and 09/2008 Gastroesophageal reflux disease; h/o Schatzki's ring and dysphagia; + esophageal dilatation,most recently in '11 AODM-no insulin HYPERTENSION (ICD-401.9) Polymyalgia rheumatica-2010 HYPOTHYROIDISM (ICD-244.9) ALLERGY (ICD-995.3) ASTHMA (ICD-493.90) COLONIC POLYPS, ADENOMATOUS (ICD-211.3) COUMADIN THERAPY (ICD-V58.61) DIARRHEA, ACUTE (ICD-787.91) ABDOMINAL PAIN, LOWER (ICD-789.09) RECTAL BLEEDING (ICD-569.3) Pedal edema-minor  Review of Systems       See HPI.  Vital Signs:  Patient profile:   70 year old female Weight:      253 pounds BMI:     44.98 Pulse rate:   70 / minute BP sitting:   129 / 65  (right arm)  Vitals Entered By: Doretha Sou, CNA (Rayleen 22, 2011 1:07 PM)  Physical Exam  General:   General-Well developed; no acute distress; obese   Neck-No JVD; no carotid bruits: Lungs-No tachypnea, no rales; no rhonchi; no wheezes; decreased breath sounds at the bases Cardiovascular-normal  PMI; distant S1 and S2; minimal systolic ejection murmur Abdomen-BS normal; soft and non-tender without masses or organomegaly:  Neurologic-Normal cranial nerves; symmetric strength and tone:  Skin-Warm, no significant lesions: Extremities-1-2+ edema restricted to his feet and  ankles; normal distal pulses     Impression & Recommendations:  Problem # 1:  CAROTID BRUIT-BILATERAL (ICD-785.9) Carotid ultrasound shows mild plaque without significant focal disease.  Management of vascular risk factors will be pursued.  Continuing full anticoagulation may reduce the likelihood of a TIA or CVA.  Problem # 2:  HYPERTENSION (ICD-401.9) Blood pressure control is excellent; current medications will be continued.  Problem # 3:  FIBRILLATION, ATRIAL (ICD-427.31) No definite clinical evidence for recurrent atrial fibrillation.  Rate control medication and anticoagulation will be continued.  CBC and stool for Hemoccult testing will be obtained to monitor chronic anticoagulation.  Other Orders: EKG w/ Interpretation (93000) Hemoccult Cards (Take Home) (Hemoccult Cards) Future Orders: T-CBC w/Diff [id number]) ... 02/12/2010 T-Lipid Profile [id number]) ... 02/12/2010  Patient Instructions: 1)  Your physician recommends that you schedule a follow-up appointment in: [place] 2)  Your physician recommends that you return for lab work in: 2 MONTHS 3)  Your physician has recommended you make the following change in your medication: STOP VYTORIN, START PRAVASTATIN 40MG  DAILY 4)  Your physician has asked that you test your stool for blood. It is necessary to test 3 different stool specimens for accuracy. You will be given 3 hemoccult cards for specimen collection. For each stool specimen, place a small portion of stool sample (from 2 different areas of the stool) into the 2 squares on the card. Close card. Repeat with 2 more stool specimens. Bring the cards back to the office for  testing. Prescriptions: PRAVASTATIN SODIUM 40 MG TABS (PRAVASTATIN SODIUM) Take one tablet by mouth daily at bedtime  #30 x 3   Entered by:   Tye Savoy RN   Authorized by:   Yehuda Savannah, MD, [facility]   Signed by:   Tye Savoy RN on 12/13/2009   Method used:   Electronically to        [facility]* (retail)       [address]       [place], [place]  [address]       Ph: [phone]       Fax: [phone]   RxID:   [id number]

## 2010-07-24 NOTE — Miscellaneous (Signed)
Summary: LABS CBCD,BMP,LIPIDS,LIVER,A1C,11/13/2009  Clinical Lists Changes  Observations: Added new observation of CALCIUM: 9.5 mg/dL (11/13/2009 11:51) Added new observation of ALBUMIN: 3.7 g/dL (11/13/2009 11:51) Added new observation of PROTEIN, TOT: 6.4 g/dL (11/13/2009 11:51) Added new observation of SGPT (ALT): 22 units/L (11/13/2009 11:51) Added new observation of SGOT (AST): 16 units/L (11/13/2009 11:51) Added new observation of ALK PHOS: 61 units/L (11/13/2009 11:51) Added new observation of BILI DIRECT: 0.1 mg/dL (11/13/2009 11:51) Added new observation of CREATININE: 0.77 mg/dL (11/13/2009 11:51) Added new observation of BUN: 18 mg/dL (11/13/2009 11:51) Added new observation of BG RANDOM: 106 mg/dL (11/13/2009 11:51) Added new observation of CO2 PLSM/SER: 27 meq/L (11/13/2009 11:51) Added new observation of CL SERUM: 103 meq/L (11/13/2009 11:51) Added new observation of K SERUM: 4.0 meq/L (11/13/2009 11:51) Added new observation of NA: 143 meq/L (11/13/2009 11:51) Added new observation of LDL: 62 mg/dL (11/13/2009 11:51) Added new observation of HDL: 61 mg/dL (11/13/2009 11:51) Added new observation of TRIGLYC TOT: 112 mg/dL (11/13/2009 11:51) Added new observation of CHOLESTEROL: 145 mg/dL (11/13/2009 11:51) Added new observation of PLATELETK/UL: 332 K/uL (11/13/2009 11:51) Added new observation of MCV: 89.1 fL (11/13/2009 11:51) Added new observation of HCT: 39.4 % (11/13/2009 11:51) Added new observation of HGB: 11.5 g/dL (11/13/2009 11:51) Added new observation of WBC COUNT: 13.3 10*3/microliter (11/13/2009 11:51) Added new observation of HGBA1C: 7.6 % (11/13/2009 11:51)

## 2010-08-20 ENCOUNTER — Ambulatory Visit (INDEPENDENT_AMBULATORY_CARE_PROVIDER_SITE_OTHER): Payer: Medicare Other | Admitting: Internal Medicine

## 2010-08-20 DIAGNOSIS — K219 Gastro-esophageal reflux disease without esophagitis: Secondary | ICD-10-CM

## 2010-08-20 DIAGNOSIS — K589 Irritable bowel syndrome without diarrhea: Secondary | ICD-10-CM

## 2010-09-08 LAB — DIFFERENTIAL
Basophils Absolute: 0 10*3/uL (ref 0.0–0.1)
Basophils Relative: 0 % (ref 0–1)
Lymphocytes Relative: 16 % (ref 12–46)
Monocytes Relative: 7 % (ref 3–12)
Neutro Abs: 10.9 10*3/uL — ABNORMAL HIGH (ref 1.7–7.7)
Neutrophils Relative %: 76 % (ref 43–77)

## 2010-09-08 LAB — CBC
HCT: 38 % (ref 36.0–46.0)
Hemoglobin: 11.9 g/dL — ABNORMAL LOW (ref 12.0–15.0)
Platelets: 341 10*3/uL (ref 150–400)
WBC: 14.3 10*3/uL — ABNORMAL HIGH (ref 4.0–10.5)

## 2010-09-12 LAB — CBC
HCT: 34.7 % — ABNORMAL LOW (ref 36.0–46.0)
Hemoglobin: 11.4 g/dL — ABNORMAL LOW (ref 12.0–15.0)
MCV: 85.6 fL (ref 78.0–100.0)
Platelets: 294 10*3/uL (ref 150–400)
WBC: 12.4 10*3/uL — ABNORMAL HIGH (ref 4.0–10.5)

## 2010-09-12 LAB — PROTIME-INR
INR: 2.29 — ABNORMAL HIGH (ref 0.00–1.49)
Prothrombin Time: 25 seconds — ABNORMAL HIGH (ref 11.6–15.2)

## 2010-09-12 LAB — DIFFERENTIAL
Basophils Relative: 0 % (ref 0–1)
Eosinophils Relative: 0 % (ref 0–5)
Lymphocytes Relative: 9 % — ABNORMAL LOW (ref 12–46)
Lymphs Abs: 1.1 10*3/uL (ref 0.7–4.0)
Monocytes Relative: 3 % (ref 3–12)
Neutro Abs: 10.9 10*3/uL — ABNORMAL HIGH (ref 1.7–7.7)

## 2010-09-12 LAB — BASIC METABOLIC PANEL
BUN: 14 mg/dL (ref 6–23)
Chloride: 104 mEq/L (ref 96–112)
Potassium: 3.6 mEq/L (ref 3.5–5.1)
Sodium: 142 mEq/L (ref 135–145)

## 2010-09-26 LAB — DIFFERENTIAL
Basophils Absolute: 0.1 10*3/uL (ref 0.0–0.1)
Eosinophils Relative: 1 % (ref 0–5)
Lymphocytes Relative: 16 % (ref 12–46)
Lymphs Abs: 2.1 10*3/uL (ref 0.7–4.0)
Monocytes Absolute: 0.5 10*3/uL (ref 0.1–1.0)
Neutro Abs: 10.4 10*3/uL — ABNORMAL HIGH (ref 1.7–7.7)

## 2010-09-26 LAB — CBC
HCT: 36.1 % (ref 36.0–46.0)
Hemoglobin: 11.7 g/dL — ABNORMAL LOW (ref 12.0–15.0)
RBC: 4.27 MIL/uL (ref 3.87–5.11)
WBC: 13.1 10*3/uL — ABNORMAL HIGH (ref 4.0–10.5)

## 2010-10-02 ENCOUNTER — Encounter: Payer: Self-pay | Admitting: Cardiology

## 2010-10-02 ENCOUNTER — Other Ambulatory Visit: Payer: Self-pay | Admitting: *Deleted

## 2010-10-02 ENCOUNTER — Ambulatory Visit (INDEPENDENT_AMBULATORY_CARE_PROVIDER_SITE_OTHER): Payer: Medicare Other | Admitting: Cardiology

## 2010-10-02 DIAGNOSIS — R259 Unspecified abnormal involuntary movements: Secondary | ICD-10-CM

## 2010-10-02 DIAGNOSIS — G609 Hereditary and idiopathic neuropathy, unspecified: Secondary | ICD-10-CM

## 2010-10-02 DIAGNOSIS — E785 Hyperlipidemia, unspecified: Secondary | ICD-10-CM

## 2010-10-02 DIAGNOSIS — I2699 Other pulmonary embolism without acute cor pulmonale: Secondary | ICD-10-CM

## 2010-10-02 DIAGNOSIS — G629 Polyneuropathy, unspecified: Secondary | ICD-10-CM

## 2010-10-02 DIAGNOSIS — I2510 Atherosclerotic heart disease of native coronary artery without angina pectoris: Secondary | ICD-10-CM

## 2010-10-02 DIAGNOSIS — R251 Tremor, unspecified: Secondary | ICD-10-CM

## 2010-10-02 DIAGNOSIS — Z7901 Long term (current) use of anticoagulants: Secondary | ICD-10-CM

## 2010-10-02 DIAGNOSIS — I4891 Unspecified atrial fibrillation: Secondary | ICD-10-CM

## 2010-10-02 DIAGNOSIS — I10 Essential (primary) hypertension: Secondary | ICD-10-CM

## 2010-10-02 LAB — BLOOD GAS, ARTERIAL
Acid-Base Excess: 6.5 mmol/L — ABNORMAL HIGH (ref 0.0–2.0)
FIO2: 0.21 %
O2 Saturation: 95.1 %
Patient temperature: 37
pCO2 arterial: 50.7 mmHg — ABNORMAL HIGH (ref 35.0–45.0)
pO2, Arterial: 79.1 mmHg — ABNORMAL LOW (ref 80.0–100.0)

## 2010-10-02 LAB — GLUCOSE, CAPILLARY
Glucose-Capillary: 113 mg/dL — ABNORMAL HIGH (ref 70–99)
Glucose-Capillary: 115 mg/dL — ABNORMAL HIGH (ref 70–99)

## 2010-10-02 LAB — PROTIME-INR: Prothrombin Time: 36.7 seconds — ABNORMAL HIGH (ref 11.6–15.2)

## 2010-10-02 MED ORDER — WARFARIN SODIUM 5 MG PO TABS: ORAL_TABLET | ORAL | Status: DC

## 2010-10-02 NOTE — Progress Notes (Signed)
HPI :  [name] returns to the office as scheduled for continued assessment and treatment of rare episodes of atrial fibrillation, coronary artery disease, cardiovascular risk factors and a history of pulmonary embolism requiring anticoagulation.  Since her last visit, she is doing extremely well from a cardiac standpoint.  Exercise tolerance is good with no orthopnea, PND, pedal edema, syncope, chest discomfort or dyspnea.  She has experienced increased myalgias prompting initiation of treatment with prednisone.  Current dosage is 20 mg with continuing tapering planned.  Patient has noted a gradual increase in tremor during the past 2 years.  She also has developed numbness in the left lower extremity and a sensation that she cannot keep her balance, although she has not fallen.  Current Outpatient Prescriptions on File Prior to Visit  Medication Sig Dispense Refill  . Cholecalciferol (VITAMIN D) 400 UNITS capsule Take 400 Units by mouth once a week.        . diltiazem (CARDIZEM CD) 300 MG 24 hr capsule Take 300 mg by mouth daily.        . fluticasone (FLOVENT HFA) 44 MCG/ACT inhaler Inhale 1 puff into the lungs 2 (two) times daily.        . Levothyroxine Sodium 50 MCG CAPS Take 1 capsule by mouth daily.        . montelukast (SINGULAIR) 10 MG tablet Take 10 mg by mouth at bedtime.        . potassium chloride SA (K-DUR,KLOR-CON) 20 MEQ tablet Take 20 mEq by mouth daily.       . valsartan (DIOVAN) 320 MG tablet Take 320 mg by mouth daily.        Marland Kitchen DISCONTD: digoxin (LANOXIN) 0.25 MG tablet Take 250 mcg by mouth daily.        Marland Kitchen DISCONTD: furosemide (LASIX) 40 MG tablet Take 80 mg by mouth daily.       Marland Kitchen DISCONTD: pravastatin (PRAVACHOL) 40 MG tablet Take 40 mg by mouth at bedtime.        Marland Kitchen DISCONTD: predniSONE (DELTASONE) 10 MG tablet Take 20 mg by mouth daily.        Marland Kitchen DISCONTD: warfarin (COUMADIN) 5 MG tablet Take 5 mg by mouth daily. As directed per coumadin clinic          No Known Allergies     Past medical history, social history, and family history reviewed and updated.  ROS: General: no anorexia, weight gain or weight loss Cardiac: no chest pain, dyspnea, orthopnea, PND,  or syncope Respiratory: no cough, sputum production or hemoptysis GI: no nausea, abdominal pain, emesis, diarrhea or constipation Integument: no significant lesions Neurologic: No muscle weakness or paralysis; no speech disturbance; no headache   PHYSICAL EXAM: BP 138/55  Pulse 74  Ht 5\' 3"  (1.6 m)  Wt 246 lb (111.585 kg)  BMI 43.58 kg/m2  SpO2 93% Weight decreased 7 pounds since previous visit General-Well developed; no acute distress Body habitus-obese Neck-No JVD; no carotid bruits Lungs-clear lung fields; resonant to percussion Cardiovascular-normal PMI; normal S1 and S2; modest systolic murmur Abdomen-normal bowel sounds; soft and non-tender without masses or organomegaly Musculoskeletal-No deformities, no cyanosis or clubbing Neurologic-Normal cranial nerves; symmetric strength and tone; sensation to pin and light touch intact in both lower extremities, but vibration sense is markedly decreased on the left Skin-Warm, no significant lesions Extremities-distal pulses intact; trace edema  ASSESSMENT AND PLAN:

## 2010-10-02 NOTE — Assessment & Plan Note (Signed)
Patient has had no symptoms to suggest myocardial ischemia for the past 6 years.

## 2010-10-02 NOTE — Assessment & Plan Note (Signed)
Episodes of atrial fibrillation have been widely separated and well tolerated.  Patient experiences no symptoms suggesting arrhythmia in between clearly documented spells.  Digoxin will be discontinued, as there is likely little benefit and medical regime is complex.  Full anticoagulation is required for her history of pulmonary embolism, minimizing risk of thromboembolism from a cardiac source.

## 2010-10-02 NOTE — Assessment & Plan Note (Signed)
Patient is concerned about both her tremor and the sensory defect in her left foot.  Situation discussed with Dr. Wolfgang Phoenix.  We agree that neurologic consultation would be helpful.

## 2010-10-02 NOTE — Assessment & Plan Note (Addendum)
Control of hypertension is excellent with current medications, which will be continued.

## 2010-10-02 NOTE — Assessment & Plan Note (Addendum)
With known coronary disease, efforts to control hyperlipidemia should be vigorous. Lipid profile from 8 months ago showed very good control.

## 2010-10-02 NOTE — Assessment & Plan Note (Signed)
With a history of unprovoked pulmonary embolism, full anticoagulation will be continued unless risk should exceed benefit in the future.

## 2010-10-02 NOTE — Patient Instructions (Addendum)
Your physician recommends that you schedule a follow-up appointment in:10 months Your physician has recommended you make the following change in your medication: discontinue digoxin, change warfarin to 2.5mg  on Tuesdays and Fridays and 1 tablet all other days Your physician recommends that you return for lab work in: today along with stool cards instructions inside of stool card packet

## 2010-10-03 LAB — DIFFERENTIAL
Basophils Absolute: 0 10*3/uL (ref 0.0–0.1)
Basophils Relative: 0 % (ref 0–1)
Eosinophils Absolute: 0 10*3/uL (ref 0.0–0.7)
Eosinophils Absolute: 0.1 10*3/uL (ref 0.0–0.7)
Eosinophils Relative: 0 % (ref 0–5)
Lymphocytes Relative: 26 % (ref 12–46)
Lymphs Abs: 3 10*3/uL (ref 0.7–4.0)
Lymphs Abs: 3.5 10*3/uL (ref 0.7–4.0)
Monocytes Relative: 5 % (ref 3–12)
Neutro Abs: 8.4 10*3/uL — ABNORMAL HIGH (ref 1.7–7.7)
Neutrophils Relative %: 69 % (ref 43–77)
Neutrophils Relative %: 69 % (ref 43–77)

## 2010-10-03 LAB — CARDIAC PANEL(CRET KIN+CKTOT+MB+TROPI)
CK, MB: 2.3 ng/mL (ref 0.3–4.0)
Relative Index: INVALID (ref 0.0–2.5)
Relative Index: INVALID (ref 0.0–2.5)
Total CK: 35 U/L (ref 7–177)
Troponin I: 0.03 ng/mL (ref 0.00–0.06)

## 2010-10-03 LAB — HEPATIC FUNCTION PANEL
ALT: 30 U/L (ref 0–35)
Albumin: 3.1 g/dL — ABNORMAL LOW (ref 3.5–5.2)
Indirect Bilirubin: 0.5 mg/dL (ref 0.3–0.9)
Total Protein: 5.7 g/dL — ABNORMAL LOW (ref 6.0–8.3)

## 2010-10-03 LAB — CBC
HCT: 38.2 % (ref 36.0–46.0)
MCV: 84.4 fL (ref 78.0–100.0)
Platelets: 170 10*3/uL (ref 150–400)
Platelets: 227 10*3/uL (ref 150–400)
RBC: 4.13 MIL/uL (ref 3.87–5.11)
RDW: 19.1 % — ABNORMAL HIGH (ref 11.5–15.5)
WBC: 12.1 10*3/uL — ABNORMAL HIGH (ref 4.0–10.5)
WBC: 13.3 10*3/uL — ABNORMAL HIGH (ref 4.0–10.5)

## 2010-10-03 LAB — BASIC METABOLIC PANEL
BUN: 27 mg/dL — ABNORMAL HIGH (ref 6–23)
CO2: 31 mEq/L (ref 19–32)
Calcium: 9.4 mg/dL (ref 8.4–10.5)
Chloride: 106 mEq/L (ref 96–112)
Creatinine, Ser: 0.9 mg/dL (ref 0.4–1.2)
Creatinine, Ser: 1.06 mg/dL (ref 0.4–1.2)
GFR calc Af Amer: >60 mL/min (ref >60)
GFR calc non Af Amer: 51 mL/min — ABNORMAL LOW (ref >60)
Glucose, Bld: 134 mg/dL — ABNORMAL HIGH (ref 70–99)
Potassium: 3.3 mEq/L — ABNORMAL LOW (ref 3.5–5.1)
Sodium: 143 mEq/L (ref 135–145)

## 2010-10-03 LAB — GLUCOSE, CAPILLARY
Glucose-Capillary: 165 mg/dL — ABNORMAL HIGH (ref 70–99)
Glucose-Capillary: 168 mg/dL — ABNORMAL HIGH (ref 70–99)
Glucose-Capillary: 37 mg/dL — CL (ref 70–99)

## 2010-10-03 LAB — PROTIME-INR
INR: 1.1 (ref 0.00–1.49)
INR: 1.8 — ABNORMAL HIGH (ref 0.00–1.49)
Prothrombin Time: 14.3 seconds (ref 11.6–15.2)
Prothrombin Time: 22.3 seconds — ABNORMAL HIGH (ref 11.6–15.2)

## 2010-10-03 LAB — D-DIMER, QUANTITATIVE: D-Dimer, Quant: 3.56 ug/mL-FEU — ABNORMAL HIGH (ref 0.00–0.48)

## 2010-10-03 LAB — APTT: aPTT: 35 seconds (ref 24–37)

## 2010-10-03 LAB — DIGOXIN LEVEL: Digoxin Level: 1.3 ng/mL (ref 0.8–2.0)

## 2010-10-03 LAB — BRAIN NATRIURETIC PEPTIDE: Pro B Natriuretic peptide (BNP): 50.6 pg/mL (ref 0.0–100.0)

## 2010-10-04 LAB — DIGOXIN LEVEL: Digoxin Level: 0.4 ng/mL — ABNORMAL LOW (ref 0.8–2.0)

## 2010-10-04 LAB — DIFFERENTIAL
Basophils Absolute: 0 10*3/uL (ref 0.0–0.1)
Lymphocytes Relative: 10 % — ABNORMAL LOW (ref 12–46)
Lymphs Abs: 1.4 10*3/uL (ref 0.7–4.0)
Neutro Abs: 12.3 10*3/uL — ABNORMAL HIGH (ref 1.7–7.7)

## 2010-10-04 LAB — PROTIME-INR
INR: 1 (ref 0.00–1.49)
Prothrombin Time: 13.7 seconds (ref 11.6–15.2)

## 2010-10-04 LAB — BASIC METABOLIC PANEL
BUN: 20 mg/dL (ref 6–23)
Calcium: 8.8 mg/dL (ref 8.4–10.5)
GFR calc non Af Amer: >60 mL/min (ref >60)
Glucose, Bld: 126 mg/dL — ABNORMAL HIGH (ref 70–99)
Potassium: 4 mEq/L (ref 3.5–5.1)
Sodium: 142 mEq/L (ref 135–145)

## 2010-10-04 LAB — GLUCOSE, CAPILLARY
Glucose-Capillary: 111 mg/dL — ABNORMAL HIGH (ref 70–99)
Glucose-Capillary: 145 mg/dL — ABNORMAL HIGH (ref 70–99)
Glucose-Capillary: <10 mg/dL — CL (ref 70–99)

## 2010-10-04 LAB — LIPID PANEL
HDL: 58 mg/dL (ref >39)
LDL Cholesterol: 61 mg/dL (ref 0–99)
Triglycerides: 132 mg/dL (ref <150)
VLDL: 26 mg/dL (ref 0–40)

## 2010-10-04 LAB — POCT CARDIAC MARKERS: Myoglobin, poc: 66.4 ng/mL (ref 12–200)

## 2010-10-04 LAB — CARDIAC PANEL(CRET KIN+CKTOT+MB+TROPI)
CK, MB: 2 ng/mL (ref 0.3–4.0)
CK, MB: 2.2 ng/mL (ref 0.3–4.0)
Total CK: 26 U/L (ref 7–177)
Troponin I: 0.01 ng/mL (ref 0.00–0.06)
Troponin I: 0.01 ng/mL (ref 0.00–0.06)

## 2010-10-04 LAB — CBC
Platelets: 309 10*3/uL (ref 150–400)
RDW: 18.6 % — ABNORMAL HIGH (ref 11.5–15.5)
WBC: 14.1 10*3/uL — ABNORMAL HIGH (ref 4.0–10.5)

## 2010-10-04 LAB — TSH: TSH: 0.747 u[IU]/mL (ref 0.350–4.500)

## 2010-10-04 LAB — T4, FREE: Free T4: 1.07 ng/dL (ref 0.89–1.80)

## 2010-10-04 LAB — BRAIN NATRIURETIC PEPTIDE: Pro B Natriuretic peptide (BNP): 110 pg/mL — ABNORMAL HIGH (ref 0.0–100.0)

## 2010-10-06 LAB — CBC
HCT: 36.8 % (ref 36.0–46.0)
Hemoglobin: 10.9 g/dL — ABNORMAL LOW (ref 12.0–15.0)
MCH: 24.7 pg — ABNORMAL LOW (ref 26.0–34.0)
MCHC: 29.6 g/dL — ABNORMAL LOW (ref 30.0–36.0)
MCV: 83.4 fL (ref 78.0–100.0)
RDW: 19.5 % — ABNORMAL HIGH (ref 11.5–15.5)

## 2010-10-06 LAB — BASIC METABOLIC PANEL
CO2: 30 mEq/L (ref 19–32)
Calcium: 9.1 mg/dL (ref 8.4–10.5)
Chloride: 104 mEq/L (ref 96–112)
Glucose, Bld: 92 mg/dL (ref 70–99)
Sodium: 145 mEq/L (ref 135–145)

## 2010-10-08 ENCOUNTER — Other Ambulatory Visit: Payer: Self-pay | Admitting: Cardiology

## 2010-10-17 ENCOUNTER — Telehealth: Payer: Self-pay | Admitting: Cardiology

## 2010-10-17 NOTE — Telephone Encounter (Signed)
Patient made aware of appointment with Dr.Love on 11/12/10 / tg

## 2010-10-23 ENCOUNTER — Other Ambulatory Visit: Payer: Self-pay | Admitting: Family Medicine

## 2010-10-23 DIAGNOSIS — Z139 Encounter for screening, unspecified: Secondary | ICD-10-CM

## 2010-10-23 DIAGNOSIS — IMO0002 Reserved for concepts with insufficient information to code with codable children: Secondary | ICD-10-CM

## 2010-10-30 ENCOUNTER — Ambulatory Visit (HOSPITAL_COMMUNITY)
Admission: RE | Admit: 2010-10-30 | Discharge: 2010-10-30 | Disposition: A | Discharge status: Home / Self Care | Payer: Medicare Other | Source: Ambulatory Visit | Attending: Physical Therapy | Admitting: Physical Therapy

## 2010-10-30 DIAGNOSIS — IMO0001 Reserved for inherently not codable concepts without codable children: Secondary | ICD-10-CM | POA: Insufficient documentation

## 2010-10-30 DIAGNOSIS — M25569 Pain in unspecified knee: Secondary | ICD-10-CM | POA: Insufficient documentation

## 2010-10-30 DIAGNOSIS — R262 Difficulty in walking, not elsewhere classified: Secondary | ICD-10-CM | POA: Insufficient documentation

## 2010-10-30 DIAGNOSIS — M25669 Stiffness of unspecified knee, not elsewhere classified: Secondary | ICD-10-CM | POA: Insufficient documentation

## 2010-10-30 DIAGNOSIS — I10 Essential (primary) hypertension: Secondary | ICD-10-CM | POA: Insufficient documentation

## 2010-10-30 DIAGNOSIS — M6281 Muscle weakness (generalized): Secondary | ICD-10-CM | POA: Insufficient documentation

## 2010-10-30 DIAGNOSIS — E119 Type 2 diabetes mellitus without complications: Secondary | ICD-10-CM | POA: Insufficient documentation

## 2010-11-02 ENCOUNTER — Ambulatory Visit (HOSPITAL_COMMUNITY)
Admission: RE | Admit: 2010-11-02 | Discharge: 2010-11-02 | Disposition: A | Discharge status: Home / Self Care | Payer: Medicare Other | Source: Ambulatory Visit | Attending: Family Medicine | Admitting: Family Medicine

## 2010-11-02 DIAGNOSIS — Z139 Encounter for screening, unspecified: Secondary | ICD-10-CM

## 2010-11-06 NOTE — Group Therapy Note (Signed)
NAME:  [name], [name]                ACCOUNT NO.:  [id number]   MEDICAL RECORD NO.:  [id number]          PATIENT TYPE:  INP   LOCATION:  A334                          FACILITY:  [facility]   PHYSICIAN:  Scott A. Wolfgang Phoenix, MD    DATE OF BIRTH:  [birth year]   DATE OF PROCEDURE:  10/18/2008  DATE OF DISCHARGE:                                 PROGRESS NOTE   Patient was seen this evening actually feeling okay.  She gets at little  short of breath when she moves around.  Her CAT scan did show probable  multiple pulmonary emboli.  The patient denies any swelling in her legs,  but she is tender behind her calves.  In addition to this, she also has  history of atrial fib.  Echo was done, but we are awaiting the results  on that.  The patient has had arterial Dopplers of the legs but no  recent venous imaging of the legs.  So, therefore, we will do venous  ultrasound of legs.  It really does not change the management, but it  might help understand where these clots came from.  In addition to this,  patient complains today of epigastric pain.  She is already on Protonix,  but we will consult GI to see if they feel there is any contraindication  to ongoing Coumadin.  I am not really sure if they would want to do an  EGD while she is in the hospital or not.  The patient had requested Dr.  Melony Overly, but I informed the patient that as an inpatient, she needs to go  ahead with GI here, and she would like to see Dr. Caro Hight.      Scott A. Wolfgang Phoenix, MD  Electronically Signed     SAL/MEDQ  D:  10/18/2008  T:  10/18/2008  Job:  [id number]

## 2010-11-06 NOTE — H&P (Signed)
[name], [name]                ACCOUNT NO.:  [id number]   MEDICAL RECORD NO.:  [id number]          PATIENT TYPE:  INP   LOCATION:  A313                          FACILITY:  [facility]   PHYSICIAN:  Margaretmary Eddy, M.D.DATE OF BIRTH:  April 20, 1941   DATE OF ADMISSION:  09/14/2008  DATE OF DISCHARGE:  LH                              HISTORY & PHYSICAL   CHIEF COMPLAINT:  Shortness of breath, rapid heart rate.   SUBJECTIVE:  This patient is a 70 year old white female with history of  type 2 diabetes, hypertension, coronary artery disease, hyperlipidemia,  atrial fibrillation who presents to the emergency room on day of  admission with acute complaints.  The patient had noted in the prior day  she developed the sense of very rapid heart rate, this was accompanied  by weakness.  The patient had no chest pain.  She stated she woke up  early in the morning that she felt the onset was relatively rapid.  Currently, she is on prednisone for polymyalgia rheumatica.  The patient  feels that this may be related as in the past when she has chronic  atrial fibrillation.  She has often been on prednisone when it is  occurred in the past.  The patient has no true chest pain.  No abdominal  pain, muscle weakness is improving slowly on polymyalgia rheumatica.  She states her sugars overall are in decent control most running in the  low 100s.   PAST SURGERIES:  Many including remote palate operation, bilateral  oophorectomy with hysterectomy, remote appendectomy, remote  cholecystectomy, remote breast reduction.   FAMILY HISTORY:  Positive for breast cancer, colon cancer, hypertension,  type 2 diabetes, coronary artery disease, hyperlipidemia.   ALLERGIES:  Many sensitivities including AMOXICILLIN, ZOLOFT, PAXIL,  METAXALONE, HYDROCODONE, ZOCOR.   CURRENT MEDICATIONS:  The patient claims compliance with her current  medications which include,  1. Long-acting diltiazem 180 mg daily.  2. Levothroid  50 mcg daily.  3. Lasix 80 mg p.o. q.a.m.  4. Potassium chloride 20 mEq p.o. q.a.m.  5. Singulair 10 mg daily.  6. Vytorin 10/20 one daily.  7. Aspirin 81 mg daily.  8. Lanoxin 0.25 alternating with 0.125.  9. Tylenol p.r.n.  10.Diovan 320 mg daily.   SOCIAL HISTORY:  The patient is widowed.  No tobacco or alcohol use.  No  children.   REVIEW OF SYSTEMS:  Positive for recent diagnosis of polymyalgia  rheumatica.  Mental status, the patient states she has been feeling bit  more down of late.  We had recently proposed that she start Wellbutrin,  but she did not get around to that.   PHYSICAL EXAMINATION:  VITAL SIGNS:  Blood pressure 143/73, afebrile,  pulse initially 125, respiratory rate 20 breaths per minute, O2 sat 95%.  GENERAL:  Alert, some anxiety noted.  HEENT:  Normal.  NECK:  Supple.  LUNGS:  Clear rate and rhythm.  ABDOMEN:  Soft.  HEART:  Heart rate was initially up.  On auscultation later in the day  heart rate had normalized.  EXTREMITIES: Of note, ankles trace edema.  Some chronic gentle changes  noted in the hand.   EKG atrial fibrillation initially rapid response improved on repeat  Lanoxin level low at 0.4.  CBC, white blood count up a bit 14,000  hemoglobin good.  INR 1.0.  Initial cardiac enzymes negative.  Portable  chest x-ray, no acute disease.   IMPRESSION:  1. Acute onset of atrial fibrillation with rapid heart rate and      weakness.  At one point, the heart rate was over 130.  The patient      was quite symptomatic with this.  Currently stable, but due to      underlying history of coronary artery disease, feel admission is      warranted.  2. Coronary artery disease.  3. Type 2 diabetes.  4. Reflux.  5. Polymyalgia rheumatica.  6. Chronic depression.  7. Chronic anxiety.   PLAN:  Admitted for IV Cardizem drip.  Serial cardiac enzymes.  Cardiology consult.  Adjust Lanoxin dosage.      Margaretmary Eddy, M.D.  Electronically  Signed     WSL/MEDQ  D:  09/15/2008  T:  09/16/2008  Job:  [id number]

## 2010-11-06 NOTE — Assessment & Plan Note (Signed)
[facility] OFFICE NOTE   NAME:[name] [name] BAUSERMAN                       MRN:          [id number]  DATE:06/13/2008                            DOB:          1940/12/28    CARDIOLOGIST:  [name] [name]. [name] Haw, MD, [facility]   PRIMARY CARE PHYSICIAN:  [name] Eddy, MD   REASON FOR VISIT:  One-month followup.   HISTORY OF PRESENT ILLNESS:  [name] [name] is a 70 year old female patient  with a history of coronary artery disease status post prior stenting to  the proximal LAD with a drug-eluting stent and mid LAD with a bare metal  stent in 2004, and subsequent drug-eluting stent placement to the second  diagonal in the setting of a non-ST-elevation myocardial infarction who  presents to the office today for followup.  She had previously seen Dr.  Lattie [name] for lower extremity edema.  She had some medication changes  made and followup blood work.  Of note, she had an echocardiogram done  in September 2009, that demonstrated LV function to be at the lower  limits of normal.  Her Lasix was increased when she last saw Dr.  Lattie [name] and followup blood work demonstrated a creatinine of 0.67 and  potassium of 4.2.  In the office today, the patient notes that her  swelling has improved somewhat.  She did have 1 day where she was on her  feet for several hours and she had worsening edema, but when she lied  down, the edema resolved.  She has had some knee pain and swelling as  well.  She has had what sounds like arthroscopy in the past secondary to  meniscal problems.  She does have some stiffness.  She has had a  significant cough since starting on lisinopril.  She denies any chest  pain or significant change in her shortness of breath.  She sleeps on an  incline.  This is chronic without change.  She denies paroxysmal  nocturnal dyspnea.  She also notes some leg pain from time to time with  exertion.  She is concerned about  poor circulation.   CURRENT MEDICATIONS:  Singulair 10 mg daily, Levothroid 50 mcg daily,  Vytorin 10/20 mg daily, Diltiazem 180 mg daily, Potassium 40 mEq daily,  Aspirin 81 mg daily, Lasix 80 mg in the morning, 40 mg in the evening,  B12,  Digoxin 0.25 mg daily alternated with 2 tablets daily, Flovent b.i.d.,  Lisinopril 20 mg daily.   PHYSICAL EXAMINATION:  GENERAL:  She is a well-nourished, well-developed  obese female in no acute distress.  VITAL SIGNS:  Blood pressure is 161/74, pulse 77, weight 264 pounds,  which is down from 269 pounds at her last visit.  HEENT:  Normal.  NECK:  Without JVD at 90 degrees.  CARDIAC:  S1 and S2.  Regular rate and rhythm.  LUNGS:  Clear to auscultation bilaterally.  No wheezing, rhonchi, and no  rales.  ABDOMEN:  Soft, nontender.  EXTREMITIES:  With 1+ ankle edema bilaterally.  Calves are soft,  nontender.  SKIN:  Warm and dry.  NEUROLOGIC:  She is alert and oriented x3.  Cranial nerves II through  XII grossly intact.  VASCULAR:  Dorsalis pedis and posterior tibial pulses are 1 to 2+  bilaterally.   ASSESSMENT AND PLAN:  1. Peripheral edema.  The patient has had some improvement since she      was last seen.  She has evidence of varicosities on exam and I      suspect some of her edema is related to venous insufficiency.  She      states that she has been maintaining a low-salt diet.  She will      remain on her current dose of Lasix.  I have asked that she start      on lower extremity compression stockings at the lower strength (10-      20 mmHg).  I considered switching her from diltiazem to metoprolol.      However, the patient has a significant history of asthma and has      been on prednisone several times in the past and has also been      admitted to the hospital in the past for asthma exacerbations.      Therefore, I am quite hesitant about placing her on a beta-blocker.      We will try to leave her on her calcium channel blocker  at this      point in time.  I suspect that this is contributing somewhat to her      swelling.  She questioned whether or not it could be related to the      digoxin, but I explained to her that this is probably not the case.  2. Lower extremity pain with exertion.  I cannot rule out claudication      at this time.  The patient is certainly at risk for peripheral      arterial disease given her coronary artery disease.  She will be      set up for lower extremity ankle-brachial indices and arterial      Dopplers to further evaluate.  3. Hypertension.  This is uncontrolled.  She has had a significant      cough with the lisinopril.  I have discontinued the lisinopril and      placed her on Cozaar 50 mg a day.  We will see her back in 1 week      for BMET and followup blood pressure.  If her pressure is still      uncontrolled, we will increase her to 100 mg a day.  4. Coronary artery disease status post prior stenting in 2004 and 2005      as outlined above.  She has had low to normal left ventricular      function in the past as well.  She does not have any symptoms of      angina at this time.  We will continue her current medications as      outlined above.  5. Paroxysmal atrial ablation.  She is in a regular rhythm today and      seems to be maintaining normal sinus rhythm.  She is on aspirin      therapy only at this point in time.  She has risk factors of      hypertension and diabetes mellitus.  I will leave any changes in      her anticoagulation up to Dr. Lattie [name], her primary cardiologist.  6. Dyslipidemia.  She continues on Vytorin.  7. Asthma.  She will continue follow up with Dr. Wolfgang [name].   DISPOSITION:  I will bring the patient back in followup with Dr.  Lattie [name] in the next 4-6 weeks to review the above testing and make  further recommendations, if any.      [name] Dopp, PA-C  Electronically Signed      [name] [name]. [name] Blalock, MD, [facility]  Electronically Signed    SW/MedQ  DD: 06/13/2008  DT: 06/14/2008  Job #: [id number]   cc:   Viona [name]. [name] [name], JodyD.

## 2010-11-06 NOTE — Consult Note (Signed)
NAMEQUINTASHA, [name]                ACCOUNT NO.:  [id number]   MEDICAL RECORD NO.:  [id number]          PATIENT TYPE:  INP   LOCATION:  A334                          FACILITY:  [facility]   PHYSICIAN:  Cristopher Estimable. Lattie Haw, MD, FACCDATE OF BIRTH:  [birth year]   DATE OF CONSULTATION:  10/17/2008  DATE OF DISCHARGE:                                 CONSULTATION   REFERRING PHYSICIAN:  Dr. Rosemary Holms   REASON FOR CONSULTATION:  Shortness of breath.   HISTORY OF PRESENT ILLNESS:  [name] is a 70 year old female patient  with a history of coronary disease, status post drug-eluting stent to  the proximal LAD in 2004 followed by bare-metal stenting to the mid-LAD  in 2004 and subsequent drug-eluting stent placement to the second  diagonal in the setting of non-ST-elevation myocardial infarction in  2005 who was admitted from her primary care physician's office secondary  to progressively worsening dyspnea on exertion and exertional chest pain  for the last 2 weeks.  She was concerned it was her asthma but denies  any recent wheezing or cough.  She describes chronic NYHA Class IIB to  III symptoms.  She recently has had an increase in her shortness of  breath and describes NYHA Class III to IIIB symptoms.  She notes her  symptoms are similar to her previous complaints prior to her initial PCI  in 2004.  She denies any associated nausea, diaphoresis or radiating  symptoms.  She also notes some symptoms of chest pain with changes in  positioning in bed for the past several years that she attributes to her  gastroesophageal reflux disease.  Her current symptoms are very  different, however.  We have been asked to further evaluate.   PAST MEDICAL HISTORY:  1. Coronary artery disease.      a.     Status post drug-eluting stent to the proximal LAD in 2004.      b.     Status post bare-metal stent to the mid-LAD 2004.      c.     Status post drug-eluting stent to the second diagonal  secondary to NSTEMI in 2005.      d.     Residual disease at cardiac catheterization March 2005:  LAD       stent okay, mid stent 25%; small D1 95% - medical therapy;       circumflex okay; RCA okay.  2. Echocardiogram September 2009 with the ejection fraction being at      the lower limits of normal.  3. Paroxysmal atrial fibrillation with infrequent episodes - aspirin      therapy only.  4. Hypertension.  5. Hyperlipidemia.  6. Diabetes mellitus.  7. Hypothyroidism.  8. Asthma.  9. Polymyalgia rheumatica.  10.GERD.  11.History of esophageal stricture with dilatation.  12.Status post appendectomy.  13.Status post total abdominal hysterectomy with bilateral salpingo-      oophorectomy.  14.Status post cholecystectomy.  15.Status post breast reduction surgery.  16.Status post knee surgery.  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  18.Sleep apnea.   MEDICATIONS AT HOME:  1.  Diltiazem ER 240 mg daily.  2. Diovan 320 mg daily.  3. Digoxin 0.25 mg daily.  4. Furosemide 80 mg daily.  5. Potassium 20 mEq daily.  6. Levothyroxine 0.05 mg daily.  7. Vytorin 10/20 mg daily.  8. Singulair 10 mg daily.  9. Aspirin 81 mg daily.  10.Folic acid 1 mg 2 tablets daily.  11.Vitamin D 50,000 units every week.  12.Prednisone 35 mg daily.  13.Methotrexate 2.5 mg 4 tablets every week.  14.Omeprazole 20 mg daily.  15.Flovent 2 puffs b.i.d.  16.Tylenol p.r.n.   ALLERGIES:  ZYFLO, AMOXICILLIN, AND SERZONE.   SOCIAL HISTORY:  The patient lives in [place] with her husband.  She  quit smoking many years ago after remote tobacco use.  She denies  alcohol abuse.   FAMILY HISTORY:  Significant for CAD.  Her mother died in her 58s of a  myocardial infarction.  Her father died in his 15s of a myocardial  infarction.   REVIEW OF SYSTEMS:  Please see HPI.  She denies fevers, chills,  headache, sore throat, rash, syncope, near-syncope, cough, wheezing,  dysuria, hematuria, bright red blood per  rectum or melena, dysphagia.  She has had some questionable discomfort with swallowing.  She sleeps on  2 pillows chronically without significant change.  She has awoken  recently with symptoms of paroxysmal nocturnal dyspnea.  She denies any  lower extremity edema.  All other systems reviewed and negative.   PHYSICAL EXAMINATION:  GENERAL:  She is a well-nourished, well-developed  female in no acute stress.  VITAL SIGNS:  Blood pressure is 108/50, pulse 69, respirations 14,  temperature 96.7, oxygen saturation 90% on room air.  HEENT:  Normal.  NECK:  Without JVD.  LYMPH:  Without lymphadenopathy.  ENDOCRINE:  Without thyromegaly.  CARDIAC:  Normal S1-S2, regular rate and rhythm without murmur.  LUNGS:  Clear to auscultation bilaterally without wheezing, rhonchi or  rales.  SKIN:  Without rash.  ABDOMEN:  Soft, nontender with normoactive bowel sounds.  No  organomegaly.  EXTREMITIES:  Without clubbing, cyanosis or edema.  MUSCULOSKELETAL:  Without joint deformity.  NEUROLOGIC:  She is alert and oriented x3.  Cranial nerves II-XII are  grossly intact.   Chest x-ray is pending.  EKG:  Sinus rhythm with a heart rate of 75,  normal axis, low voltage, nonspecific ST-T wave abnormalities, PVCs new  since normal EKG in September 2009.  Labs are pending.   ASSESSMENT:  1. Dyspnea with exertion with associated chest discomfort in a 87-year-      old female with a history of coronary artery disease, status post      prior percutaneous coronary intervention to the left anterior      descending x2 and diagonal.  Her ejection fraction has been low      normal in the past.  2. Paroxysmal atrial fibrillation treated with aspirin secondary to      infrequent episodes.  3. Hypertension.  4. Hyperlipidemia.  5. Diabetes mellitus.  6. Hypothyroidism.  7. Asthma.  8. Polymyalgia rheumatica.   RECOMMENDATIONS:  The patient was also interviewed and examined by Dr.  Lattie Haw.  Prior records  have been reviewed.  She has had a recent  increase in exertional dyspnea.  She has had no recent or apparent  exacerbation of her chronic asthma.  Basic evaluation is currently  pending including a chest x-ray, D-dimer, CBC, BMET, TSH, cardiac  markers.  An echocardiogram will be obtained.  Her Lovenox was initially  at 1 mg/kg subcutaneously q.12 h.  This will be reduced to deep venous  thrombosis dose Lovenox.  Further recommendations to follow after the  results of the above testing are obtained.  She will likely require  stress nuclear study versus stress echocardiogram study at some point.  At this point, we doubt acute coronary syndrome.   Thank you very much for the consultation.  We will be glad to follow the  patient throughout the remainder of this admission.      Richardson Dopp, PA-C      Cristopher Estimable. Lattie Haw, MD, [facility]  Electronically Signed    SW/MEDQ  D:  10/17/2008  T:  10/17/2008  Job:  [id number]   cc:   Margaretmary Eddy, M.D.  Fax: [phone]

## 2010-11-06 NOTE — Consult Note (Signed)
[name], [name]                ACCOUNT NO.:  [id number]   MEDICAL RECORD NO.:  [id number]          PATIENT TYPE:  INP   LOCATION:  A313                          FACILITY:  [facility]   PHYSICIAN:  Cristopher Estimable. Lattie Haw, MD, FACCDATE OF BIRTH:  [birth year]   DATE OF CONSULTATION:  09/15/2008  DATE OF DISCHARGE:                                 CONSULTATION   REFERRING PHYSICIAN:  Dr. Mickie Hillier.   CARDIOLOGIST:  [name].   REASON FOR CONSULTATION:  Atrial fibrillation.   HISTORY OF PRESENT ILLNESS:  [name] is a 70 year old female patient  with a history of coronary artery disease and paroxysmal atrial  fibrillation who was recently diagnosed with polymyalgia rheumatica and  placed on prednisone.  She tells me that her prednisone dose was  increased about a week ago from 15 - 50 mg daily.  Yesterday while  washing dishes in the kitchen in the morning she developed rapid  palpitations.  She denies any associated chest pain, syncope or near  syncope.  She did feel slightly short of breath.  She decided to come to  the emergency room where she was noted be in atrial fibrillation with  rapid ventricular rate.  She was placed on diltiazem drip.  She  converted to sinus rhythm last evening.  She was able to tell when she  went back into normal sinus rhythm.  She denies aggravating or  alleviating factors.  She denies any recent history of exertional chest  pain.  She notes chronic dyspnea on exertion without change.  She has  chronic dependent lower extremity edema without change.   PAST MEDICAL HISTORY:  1. Coronary disease.      a.     Status post PCI with a drug-eluting stent the proximal LAD       in 2004.      b.     Status post PCI with a bare metal stent in the mid LAD in       2004.      c.     Status post PCI with a drug-eluting stent to the second       diagonal, secondary to NSTEMI in 2005.      d.     Cardiac catheterization March 2005 in the setting of NSTEMI:  LAD proximal stent okay, mid stent to 25%; D2 ostium 95% treated       with PCI, small D1 95% - medical therapy; circumflex and RCA okay.  2. Paroxysmal atrial ablation with infrequent episodes - aspirin      therapy only.  3. Echocardiogram September 2009:  EF lower limits of normal.  4. Hypertension.  5. Hyperlipidemia.  6. Diabetes mellitus.  7. Hypothyroidism.  8. Asthma.  9. Polymyalgia rheumatica.  10.GERD with a history of esophageal stricture and dilatation.  11.Status post appendectomy.  12.Status post TAH/BSO.  13.Status post cholecystectomy.  14.Status post breast reduction surgery.  15.Status post knee surgery.  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   HISTORY:  Sleep apnea.   MEDICATIONS AT HOME:  1. Prednisone 50 mg daily.  2.  Diovan 320 mg daily.  3. Diltiazem CD 180 mg daily.  4. Levothyroxine 0.05 mg daily.  5. Singular 10 mg daily.  6. Furosemide 80 mg daily.  7. K-Dur 20 mEq daily.  8. Vytorin 10/20 mg daily.  9. Aspirin mg daily.  10.Digoxin 0.25 mg alternating with 0.125 mg every other day.  11.Flovent 2 puffs b.i.d.  12.B12 daily.  13.Tylenol p.r.n.,  14.Vitamin D 50,000 units q. week.   ALLERGIES:  ZYFLO, AMOXICILLIN AND SERZONE.   SOCIAL HISTORY:  The patient lives in [place], she is an ex-smoker,  she denies alcohol abuse.   FAMILY HISTORY:  Significant for CAD.  Her mother died her 86s of a  myocardial infarction and her father died in his 35s of a myocardial  infarction.   REVIEW OF SYSTEMS:  She denies fevers, has had chills.  Denies headache,  rash, chest pain, shortness of breath, orthopnea, PND, syncope, near  syncope or cough.  She has had dyspnea on exertion as noted above in  HPI.  She denies dysuria, hematuria, bright red blood per rectum or  melena, nausea, vomiting, diarrhea.  She has had some abdominal pain  that is worse with movement.  She notes diffuse arthralgias in her lower  extremities.  All other systems reviewed and  negative.   PHYSICAL EXAM:  She is well-nourished, well-developed female in no  distress.  Blood pressure is 102/53, pulse 55, respirations 21,  temperature 98.1, saturation 93% on room air, weight 115.38 kg.  HEENT:  Normal.  NECK:  Without JVD.  LYMPH:  Without lymphadenopathy.  ENDOCRINE:  Without thyromegaly.  CARDIAC:  Normal S1 - S2, regular rate and rhythm with a 1-2 over 6  systolic ejection murmur heard best at the right external border.  Dorsalis pedis and posterior tibialis pulses are 2+ bilaterally.  Carotids without bruits bilaterally.  LUNGS:  Decreased breath sounds bilaterally.  No wheezing or rales.  SKIN:  Without rash, warm and dry.  ABDOMEN:  Soft, nontender with no active bowel sounds, no organomegaly.  EXTREMITIES:  Without edema bilaterally.  MUSCULOSKELETAL:  Without joint deformity.  NEUROLOGIC:  She is alert and oriented x3.  Cranial nerves II-XII are  grossly intact.   Chest x-ray:  No acute disease.  EKG:  Atrial fibrillation with a heart  rate of 107, normal axis, nonspecific ST-T wave changes.  Telemetry from  this morning demonstrates normal sinus rhythm, heart rate in the 50s.   LABS:  White count 14,001, hemoglobin 12.9, hematocrit 39.1, platelet  count 309,000, potassium 4, creatinine 0.9, glucose 126, TSH 0.747,  cardiac markers negative x3, INR 1, BNP 110, digoxin level 0.3.   ASSESSMENT:  1. Paroxysmal atrial fibrillation converted to normal sinus rhythm in      a 69 year old female with a history of hypertension and diabetes      mellitus.  She essentially has a CHADS2 score of two.  2. Coronary artery disease status post prior percutaneous coronary      intervention the left anterior descending x2 in 2004 and to the      second diagonal in 2005 in the setting of non-ST-elevation      myocardial infarction.  3. Low normal ejection fraction by echocardiogram 2009.  4. Hypothyroidism with recent TSH within normal limits.  5. Polymyalgia  rheumatica on prednisone with recent increase in      therapy.   RECOMMENDATIONS:  The patient was also seen and examined by Dr.  Lattie Haw.  Prior records have  been reviewed.  She presents with first  symptomatic recurrent atrial fibrillation episode since September of  2009.  As noted she has converted spontaneously in less than 12 hours.  Digoxin level is low but dig was supposed to be taken at 0.25 mg  alternated with 0.1875 mg every other day.  After further review of Dr.  Lattie Haw it is felt that the patient is stable for discharge to home on  0.25 mg of digoxin daily and an increased dose of diltiazem at 240 mg  daily.  At this point we are not inclined to start Coumadin.  She has  had rare brief episodes with some symptomatic episodes.  She will be set  up for an event recorder as an outpatient to assess for asymptomatic  spells.  Thank you very much for the consultation.      Richardson Dopp, PA-C      Cristopher Estimable. Lattie Haw, MD, [facility]  Electronically Signed    SW/MEDQ  D:  09/16/2008  T:  09/16/2008  Job:  [id number]

## 2010-11-06 NOTE — Discharge Summary (Signed)
NAMEMANILLA, LUERAS                ACCOUNT NO.:  [id number]   MEDICAL RECORD NO.:  [id number]          PATIENT TYPE:  INP   LOCATION:  A308                          FACILITY:  [facility]   PHYSICIAN:  Margaretmary Eddy, M.D.DATE OF BIRTH:  [birth year]   DATE OF ADMISSION:  03/12/2008  DATE OF DISCHARGE:  09/22/2009LH                               DISCHARGE SUMMARY   FINAL DIAGNOSES:  1. Exacerbation of asthma.  2. Hypothyroidism.  3. Hyperlipidemia.  4. Type 2 diabetes.   FINAL DISPOSITION:  Patient discharged to home.   DISCHARGE MEDICATIONS:  1. Prednisone taper as directed.  2. Cipro 500 mg b.i.d. for 5 days.  3. Dukes Magic mouthwash as directed.  4. Ventolin nebulizer treatments every 4 hours.   Follow-up in the office in 1 week.   INITIAL HISTORY AND PHYSICAL:  Please see H&P as dictated.   HOSPITAL COURSE:  This patient is a 70 year old white female with a  history of coronary artery disease, hypertension, hypothyroidism,  asthma, reflux, hyperlipidemia, type 2 diabetes, who arrived to the  office on the day of admission with inability to breathe.  She had been  for the entire night taking breathing treatments every 2 hours.  The  patient had very significant amounts of wheezing.  Chest x-ray showed no  true infiltrate.  The patient was noted to be quite tachypneic.  She was  hospitalized, placed on IV antibiotics, steroids, and frequent neb  treatments.  For the next several days, she gradually improved.  Sugars  were maintained with sliding scale insulin.  On day of discharge, the  patient was feeling better.  Her chest was clear.  She was discharged  home on diagnosis and disposition as noted above.      Margaretmary Eddy, M.D.  Electronically Signed     WSL/MEDQ  D:  04/17/2008  T:  04/17/2008  Job:  [id number]

## 2010-11-06 NOTE — H&P (Signed)
NAMESIENA, [name]                ACCOUNT NO.:  [id number]   MEDICAL RECORD NO.:  [id number]          PATIENT TYPE:  INP   LOCATION:  A316                          FACILITY:  [facility]   PHYSICIAN:  Scott A. Wolfgang Phoenix, MD    DATE OF BIRTH:  1941/04/05   DATE OF ADMISSION:  03/16/2008  DATE OF DISCHARGE:  LH                              HISTORY & PHYSICAL   CHIEF COMPLAINT:  Tachycardia.   HISTORY OF PRESENT ILLNESS:  This older female was recently released  from the hospital due to significant reactive airway disease.  She was  actually doing better.  Her lungs were found clear.  She states she went  home and felt pretty good, but then in the evening of the 22, she  noticed that her heart rate just started feeling like it was going fast  and then later that night it started going very fast and it caused her  to feel little bit short of breath.  She denied any chest pressure,  tightness, or pain with it.  No radiation up into the jaw or into the  arms.  No sweats with it.  She states that she had progressive symptoms  to the point where she felt scared and she called EMS and they brought  her to the hospital.   PAST MEDICAL HISTORY:  1. HTN.  2. COPD.  3. Asthma.   MEDICINES:  See med list.   FAMILY HISTORY:  Noncontributory.   REVIEW OF SYSTEMS:  See per above.   PHYSICAL EXAMINATION:  GENERAL:  Currently, she looks pretty good.  HEENT:  Benign.  TMs NLT-NL.  CHEST:  CTA.  No crackles.  HEART:  Regular.  ABDOMEN:  Soft.  EXTREMITIES:  No edema.  SKIN:  Warm and dry.   ASSESSMENT AND PLAN:  1. She is currently on a Cardizem drip.  She was given a Cardizem      bolus in the ED and that slowed the heart rate down and her current      EKG shows that she is back into normal sinus rhythm, but she is on      the Cardizem drip currently.  2. Reactive airway - stable.  Does not appear to be causing any      problems currently.  3. Atrial fibrillation with rapid ventricular response  - this seems to      be resolving.  We will order an echo as well as go ahead and have      Cardiology consult on her and then go from there.  I expect the      patient to be in the hospital if she stays in normal sinus rhythm      in the following day, hopefully be able to go home on Thursday.       Scott A. Wolfgang Phoenix, MD  Electronically Signed     SAL/MEDQ  D:  03/16/2008  T:  03/17/2008  Job:  [id number]

## 2010-11-06 NOTE — Consult Note (Signed)
[name], BENSTON                ACCOUNT NO.:  [id number]   MEDICAL RECORD NO.:  [id number]          PATIENT TYPE:  INP   LOCATION:  A316                          FACILITY:  [facility]   PHYSICIAN:  Cristopher Estimable. Lattie Haw, MD, FACCDATE OF BIRTH:  09-20-1940   DATE OF CONSULTATION:  DATE OF DISCHARGE:                                 CONSULTATION   REFERRING PHYSICIAN:  Margaretmary Eddy, MD   PRIMARY CARDIOLOGIST:  Cristopher Estimable. Lattie Haw, MD, [facility]   HISTORY OF PRESENT ILLNESS:  A 70 year old woman with previously rare  episodes of paroxysmal atrial fibrillation once again admitted with her  arrhythmia.  [name] was last seen in April 2009 after presenting to  the emergency department with palpitations.  Atrial fibrillation with a  rapid ventricular response was noted.  She was treated with intravenous  diltiazem and rapidly converted to sinus rhythm.  She did not require  admission to the hospital.  She subsequently did well, but was recently  hospitalized for an exacerbation of asthma.  On the day of discharge,  she felt fine, returned home, but then noted tachy palpitations soon  after retiring for bed.  She had no lightheadedness, syncope, increased  dyspnea nor chest discomfort.  After 45 minutes, she sought treatment in  the emergency department and was admitted.  She converted to sinus  rhythm a few hours later and has felt fine since.   She does have a history of coronary artery disease, having required  percutaneous intervention in 2004 for LAD disease and then in 2005 for  diagonal disease after presenting with a non-ST-elevation MI.  Cardiovascular risk factors include diet-controlled diabetes,  hypertension and hyperlipidemia.   PAST MEDICAL HISTORY:  Otherwise notable for severe asthma, sleep apnea,  hypothyroidism and GERD with prior dilatation for esophageal stricture.   SURGICAL PROCEDURES:  Breast reduction surgery, cholecystectomy, knee  surgery, appendectomy, cleft  palate repair and TAH/BSO.   RECENT MEDICATIONS:  1. Diltiazem 360 mg daily.  2. Flovent.  3. Vytorin 10/20 mg daily.  4. Ventolin.  5. KCl 20 mEq b.i.d.  6. Levothyroxine 0.05 mg daily.  7. Furosemide 80 mg daily.  8. Singulair 10 mg daily.  9. Omeprazole 20 mg b.i.d.  10.Prednisone.  11.Levaquin.   ALLERGIES OR ADVERSE REACTIONS:  Have occurred to ZYFLO, AMOXICILLIN,  and SERZONE.   SOCIAL HISTORY:  Widowed; lives locally; two adult children.  Remote use  of cigarettes; no excessive alcohol.   FAMILY HISTORY:  Positive for myocardial infarction in both her mother  and father.   REVIEW OF SYSTEMS:  Positive for class 2-3 exertional dyspnea, arthritic  discomfort - principally in her knees, a diabetic diet at home.  She  requires corrective lenses for near and far vision.  She has a history  of glaucoma.  All other systems reviewed and are negative.   PHYSICAL EXAMINATION:  GENERAL:  Overweight pleasant woman in no acute  distress.  VITAL SIGNS:  The temperature is 97.2, heart rate 70 and regular,  respiration 16, blood pressure 130/75, weight 123.5 kg, O2 saturation  97% on 1  liter.  HEENT:  Anicteric sclerae; EOMs full; normal oral mucosa.  NECK:  No jugular venous distention; no carotid bruits.  ENDOCRINE:  No thyromegaly.  HEMATOPOIETIC:  No adenopathy.  SKIN:  No significant lesions.  LUNGS:  Clear with prolonged expiratory phase and minimal expiratory  rhonchi.  CARDIAC:  Normal first and second heart sounds; fourth heart sound  present.  ABDOMEN:  Soft and nontender; no masses; no organomegaly.  EXTREMITIES:  Normal distal pulses; no edema.  NEUROLOGIC:  Symmetric strength and tone; normal cranial nerves.   EKG:  Normal sinus rhythm; within normal limits except for low voltage.  Previous EKG showed atrial fibrillation with rapid ventricular response  and minor nonspecific ST - T-wave abnormalities.   LABORATORY DATA:  Otherwise notable for negative cardiac  markers, a  normal BNP level, a normal chemistry profile except for glucose of 177  and a normal CBC except for white count of 13,500.   Chest x-ray shows cardiomegaly; minimal vascular redistribution;  calcifications of the aortic arch; mild emphysematous and bronchitic  changes and a small left pleural effusion.   IMPRESSION:  [name] presents with recurrent atrial fibrillation  immediately after a recent hospital admission for an asthma  exacerbation.  The arrhythmia may be related to her physiologic stress  or changes in her medication, or its occurrence may simply be  coincidental.  She continues to have relatively infrequent episodes of  atrial fibrillation with rapid ventricular response, fairly modest  symptoms and rapid spontaneous conversion to sinus rhythm.  In the  setting, I do not think antiarrhythmic agents are yet warranted.  We  will add digoxin for better control of her heart rate.  Although, the  admission paperwork suggests that she is taking diltiazem 240 mg daily,  she was taking 360 mg when I last saw her and believes that she still  is.  She can be discharged from the hospital from a cardiologic  standpoint.  She already has an appointment to see me in approximately 2  weeks.      Cristopher Estimable. Lattie Haw, MD, [facility]  Electronically Signed     RMR/MEDQ  D:  03/16/2008  T:  03/17/2008  Job:  [id number]

## 2010-11-06 NOTE — Op Note (Signed)
NAMEJERMICA, [name]                ACCOUNT NO.:  [id number]   MEDICAL RECORD NO.:  [id number]          PATIENT TYPE:  AMB   LOCATION:  [place]                          FACILITY:  [place]   PHYSICIAN:  Orson Ape. Weatherly, M.D.DATE OF BIRTH:  May 16, 1941   DATE OF PROCEDURE:  10/12/2008  DATE OF DISCHARGE:                               OPERATIVE REPORT   PREOPERATIVE DIAGNOSES:  Right-sided headache, rule out temporal  arteritis.   OPERATION:  Right temporal artery biopsy.   ANESTHESIA:  Local.   SURGEON:  Orson Ape. Rise Patience, MD   HISTORY:  [name] is a 70 year old Caucasian female who was brought  to me by Dr. Sallee Lange, to rule out temporal arteritis.  She was  supposed to see me about 3-4 weeks ago when she was noted to have right-  sided headaches and mild elevated sed rate.  She has polymyalgia  rheumatica and then she developed an atrial fibrillation and this was  under management and they did not pursue the temporal artery biopsy.  Dr. Wolfgang Phoenix put her on steroids and I first saw her yesterday and she  desired to proceed on with the temporal artery biopsy.  She says that  she has not had headaches before.  She said her sed rate of course has  been elevated with a polymyalgia rheumatica.  I recommended that we go  ahead and proceed promptly and she is here today per the plan.   PROCEDURE:  We could feel the artery and the preauricular area on the  right and I prepped the area with Betadine solution after shaving a  small area of hair in the temporal area.  We then used 1% plain  Xylocaine to infiltrate the area over the pulse and made a small  incision, we could see her blood vessel and where one was the vein and  the other one was a artery.  I could not be sure and I elected to go  ahead and take a segment of both the artery.  One looked more like an  artery.  It definitely looked normal.  It did not have any little  nodules etc., like giant cell temporal arteritis.  I  sutured the  proximal and distal end with 4-0 Vicryl and the center segment.  Each of  the segments were about 1 inch in length.  The patient tolerated the  procedure nicely.  I closed the little incision with 5-0 nylon and the  patient tolerated the procedure nicely.  She was given Vicodin and will  see me for suture removal in approximately 7 or 8 days.  We will give  her a call when we receive the biopsy report.  I expect this is going to  be normal from looking at the artery.      Orson Ape. Rise Patience, M.D.  Electronically Signed     WJW/MEDQ  D:  10/12/2008  T:  10/13/2008  Job:  [id number]   cc:   Nicki Reaper A. Wolfgang Phoenix, MD

## 2010-11-06 NOTE — Assessment & Plan Note (Signed)
[facility] OFFICE NOTE   NAME:[name], [name]                       MRN:          [id number]  DATE:12/07/2008                            DOB:          [birth year]    CARDIOLOGIST:  [name]. [name] Haw, MD, [facility]   PRIMARY CARE PHYSICIAN:  [name] Eddy, MD   REASON FOR VISIT:  A 1-week followup.   HISTORY OF PRESENT ILLNESS:  [name] is a 70 year old female patient  who was just seen on Oletha 11, 2010.  At that time, she was complaining  of some palpitations and chest pain.  Her chest pain was more related to  her GERD and Schatzki ring, as she was having significant amounts of  dysphagia and odynophagia.  She has since seen Gastroenterology and they  plan to see her back in October to hopefully perform EGD and  colonoscopy.  The patient was asked to increase her diltiazem from 240  to 300 mg a day.  This has lessened her palpitations quite  significantly.  She was responding to increased doses of Lasix, but she  has skipped that last couple of days.  Her lower extremity edema has  increased.  She denies any salt indiscretion.  She denies any chest  discomfort reminiscence for previous angina.  Her dyspnea with exertion  is unchanged.  She sleeps on 2-3 pillows.  She denies any paroxysmal  nocturnal dyspnea.   MEDICATIONS:  Unchanged from her last visit except her diltiazem is now  300 mg a day.   PHYSICAL EXAMINATION:  GENERAL:  She is a well-nourished, well-developed  female.  VITAL SIGNS:  Blood pressure is 149/81, pulse 76, weight 263 pounds.  HEENT:  Normal.  NECK:  Without JVD 90 degrees.  HEART:  S1 and S2.  Regular rate and rhythm.  LUNGS:  Clear to auscultation bilaterally.  ABDOMEN:  Soft, nontender.  EXTREMITIES:  2+ ankle edema bilaterally.  SKIN:  Warm and dry.  NEUROLOGIC:  She is alert and oriented x3.  Cranial nerves II through  XII grossly intact.   ASSESSMENT AND PLAN:  1.  Palpitations.  As noted, the patient has had paroxysmal atrial      fibrillation in the past.  Her palpitations are much better on      higher doses of diltiazem.  Her blood pressure is still somewhat      elevated.  Please see the discussion below.  2. Lower extremity edema.  This continues to be problematic.  However,      she skipped her Lasix for last couple of days.  She has been asked      to go back on this.  I have suggested possibly splitting her Lasix      to 40 mg twice a day to see if this helps.  She has had lots of      problems with frequent urination impacting her day when she takes      Lasix.  3. Hypertension.  Some of this may be attributed to skipping Lasix for      the last few  days.  She has been asked to get back on this.  She      has also been asked to get a blood pressure cuff.  She sees Dr.      Wolfgang [name] a couple of weeks.  Further medication adjustments can be      made after frequent monitoring of her blood pressure.  4. Pulmonary emboli.  She continues on Coumadin therapy.  She has her      Coumadin followed by Dr. Wolfgang [name].   DISPOSITION:  She can follow up with Dr. Lattie [name] in 4 months or sooner  p.r.n.      [name] Dopp, PA-C  Electronically Signed      [name]. [name] Haw, MD, [facility]  Electronically Signed   SW/MedQ  DD: 12/07/2008  DT: 12/08/2008  Job #: [id number]   cc:   [name], M.D.

## 2010-11-06 NOTE — Assessment & Plan Note (Signed)
[facility] OFFICE NOTE   NAME:[name], [name]                       MRN:          [id number]  DATE:12/02/2008                            DOB:          [birth year]    CARDIOLOGIST:  Dr. Jacqulyn Ducking.   PRIMARY CARE PHYSICIAN:  Dr. Rosemary Holms.   REASON FOR VISIT:  Palpitations, chest pain.   HISTORY OF PRESENT ILLNESS:  [name] is a 70 year old female patient  with a history of coronary disease and paroxysmal atrial fibrillation  who was last seen at [facility] in April 2010 when she  presented with worsening dyspnea.  She was found to have extensive  bilateral pulmonary emboli.  She was placed on Coumadin.  She was  discharged in early May 2010.  She called the office this morning with  complaints of palpitations and chest discomfort over the last week.  She  was added onto my schedule.  She notes that her rhythm is irregular and  somewhat fast.  She only notes it at rest.  She denies any pain with  exertion.  She continues to have chest pain with lying down as well as  with eating certain types of meals.  She describes odynophagia.  She is  due for follow-up with Gastroenterology on Monday of next week.  She saw  them when she was in the hospital with her pulmonary emboli.  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  She denies any exertional chest discomfort.  She denies any  anginal type symptoms.  She denies any syncope or near-syncope.  She  sometimes feels lightheaded when she stands abruptly.  She continues to  note significant dyspnea on exertion.  This has been ongoing since she  was diagnosed with pulmonary emboli.  There has been no worsening in  this symptom.  She describes NYHA Class 2B symptoms.  She sleeps on two  to three pillows.  This is chronic.  She denies  paroxysmal nocturnal  dyspnea.  She has noted some increasing pedal edema.  Her right foot is  more swollen than normal.  She actually increased her Lasix on her own  this morning.   CURRENT MEDICATIONS:  Diltiazem 240 mg daily, Digoxin 0.25 mg daily,  Diovan 320 mg a day, Potassium unknown dosage daily,  Furosemide 80 mg daily, Folic acid 1 mg daily, Singulair 10 mg daily,  Levothyroxine 50 mcg daily, Vytorin 10/20 mg daily,  Vitamin D 50,000 units q. week, Warfarin 2.5 mg daily except for 5 mg on  Saturday and Monday, Prednisone 10 mg daily,  Flovent two puffs b.i.d., Multivitamin daily, Omeprazole 20 mg daily.   ALLERGIES:  NO KNOWN DRUG ALLERGIES.   SOCIAL HISTORY:  She is a nonsmoker.   REVIEW OF SYSTEMS:  Please see HPI.  She notes significant nausea,  especially today.  She has been placed on methotrexate for her  polymyalgia rheumatica.  She states that this often causes her  significant nausea  and she has Phenergan home to take.  She denies any  hematemesis, hemoptysis, melena, hematochezia.   PHYSICAL EXAMINATION:  GENERAL:  She is well-nourished, well-developed  female in no acute distress.  VITAL SIGNS:  Blood pressure is 147/85, pulse 92, weight 261 pounds.  This is up from 258 pounds when she was discharged from hospital.  Oxygen is 93% on room air.  HEENT:  Normal.  NECK:  Without JVD at 45 degrees.  CARDIAC:  Normal S1, S2.  Regular rate and rhythm.  LUNGS:  Clear to auscultation bilaterally.  No wheezing or rales.  ABDOMEN:  Soft, nontender.  No organomegaly.  EXTREMITIES:  With 1-2+ ankle edema bilaterally.  NEUROLOGIC:  She is alert and oriented x3.  Cranial nerves II XII  grossly intact.  SKIN:  Warm and dry.   DATABASE:  Recent blood work drawn by Dr. Wolfgang Phoenix demonstrates hemoglobin  of 11.7, creatinine 0.9, we contacted Dr. Lance Sell office and her recent  INRs were as follows:  May 6, 2.9; May 25, 1.7; [place] 8, 3.2.   STUDIES:  Electrocardiogram  demonstrates sinus rhythm with a heart rate  86, leftward axis inferior Q-waves, poor R-wave progression, no ischemic  changes.  No significant change when compared to previous tracings.   ASSESSMENT/PLAN:  1. Palpitations.  The patient does have a prior history of paroxysmal      atrial fibrillation.  She notes her symptoms are somewhat similar      to her prior episodes of atrial fibrillation.  Her episodes seem to      only occur at rest.  I have discussed her case further today with      [name].  He recommends continued reassurance regarding her      symptoms.  I have decided to go ahead and increase her diltiazem      from 240 to 300 mg a day.  Her blood pressure could certainly use      better control.  2. Lower extremity edema.  She has had problems in the past with lower      extremity edema.  Her last echocardiogram in 2009 demonstrated an      ejection fraction at the lower limits of normal.  She can continue      on her higher dose of Lasix and we will recheck a B-met in a week      to follow up on her renal function and potassium.  3. Chest pain.  She does have coronary artery disease as outlined in      prior notes.  Her last cardiac catheterization was in 2005 when she      presented with non-ST-elevation myocardial infarction.  At that      time, she had a drug-eluting stent placed in the second diagonal.      She denies any symptoms reminiscent of her previous angina.  She      seems to be describing symptoms that are more consistent with her      esophageal problems.  Luckily, she has a follow-up with      Gastroenterology in several days.  In the meanwhile, I have asked      her to increase her omeprazole from 20-40 mg a day to see if this      helps.  4. Nausea, vomiting.  She seems to have this symptom associated with      methotrexate.  Her rheumatologist has given her Phenergan to take.  I offered switching her to Zofran today, but she prefers to stick       with the Phenergan.   DISPOSITION:  The patient will be brought back in follow-up with Dr.  Herbie Baltimore in the next 2 weeks or sooner p.r.n.      Richardson Dopp, PA-C  Electronically Signed      Cristopher Estimable. Lattie Haw, MD, [facility]  Electronically Signed   SW/MedQ  DD: 12/02/2008  DT: 12/02/2008  Job #: [id number]   cc:   Margaretmary Eddy, M.D.

## 2010-11-06 NOTE — Letter (Signed)
March 29, 2008    W. Rosemary Holms, MD  [address]. [place], [place] [address]   RE:  [name], [name]  MRN:  [id number]  /  DOB:  26-Apr-1941   Dear Richardson Landry,   [name] returns to the office for continued assessment and treatment  of paroxysmal atrial fibrillation.  Since recent hospital admission, she  has done well with respect to arrhythmia.  She notes some brief  palpitations, but these last a matter of seconds.  She has had no  lightheadedness and certainly no syncope.  She has been troubled by  increased fatigue and wonders whether this is due to her medication.  She has sleep apnea, but derived great benefit from positive pressure  ventilation at night.  Although she has daytime somnolence, she does not  feel that her current fatigue is related to sleep apnea.  A recent TSH  level was normal.   Current medications include Singulair 10 mg daily, levothyroxine 0.05 mg  daily, Vytorin 10/20 mg daily, diltiazem 360 mg daily, KCl 20 mEq daily,  aspirin 81 mg daily, furosemide 80 mg daily, vitamin B12 and digoxin  0.125 mg daily alternating with 0.25 mg daily.  A recent digoxin level  was 0.7.   PHYSICAL EXAMINATION:  GENERAL:  On exam, overweight pleasant woman in  no distress.  VITAL SIGNS:  The weight is 266, 4 pounds more than in April.  Blood  pressure 140/80, heart rate 75 and regular, respirations 14.  NECK:  No jugular venous distention; normal carotid upstrokes without  bruits.  LUNGS:  No wheezing; no prolongation of the expiratory phase.  CARDIAC:  Normal first and second heart sounds; fourth heart sound  present.  ABDOMEN:  Soft and nontender; no organomegaly.  EXTREMITIES:  Pitting pretibial edema 1+.   IMPRESSION:  [name] is doing rather well overall.  I am hoping her  fatigue is nonspecific and transient.  Due to a borderline low potassium  level in hospital, her potassium will be increased to 20 mEq b.i.d.  We  will check a chemistry profile in  3 weeks.  I will see her again in 1  month.    Sincerely,      Cristopher Estimable. Lattie Haw, MD, [facility]  Electronically Signed    RMR/MedQ  DD: 03/29/2008  DT: 03/30/2008  Job #: [id number]

## 2010-11-06 NOTE — Consult Note (Signed)
NAME:  [name], [name]                ACCOUNT NO.:  [id number]   MEDICAL RECORD NO.:  [id number]          PATIENT TYPE:  INP   LOCATION:  A334                          FACILITY:  [facility]   PHYSICIAN:  Caro Hight, M.D.      DATE OF BIRTH:  1941/04/11   DATE OF CONSULTATION:  10/19/2008  DATE OF DISCHARGE:                                 CONSULTATION   REASON FOR CONSULTATION:  Epigastric pain, need for chronic Coumadin  therapy.   REQUESTING PHYSICIAN:  Margaretmary Eddy, M.D.   HISTORY OF PRESENT ILLNESS:  The patient is a pleasant 70 year old  Caucasian female with complicated past medical history including  coronary artery disease status post multiple stenting, paroxysmal atrial  fibrillation (with recent admission in March of 2010), recent diagnosis  of polymyalgia rheumatica, diabetes mellitus, morbid obesity who  presented to the hospital with a 2 week history of progressive shortness  of breath.  She was diagnosed with multiple extensive bilateral  pulmonary emboli.  Lower extremity Dopplers are pending.  She has been  placed on Lovenox and Coumadin.  She will require chronic Coumadin  therapy.  We were consulted regarding chronic epigastric pain and now  the need for chronic anticoagulation.  The patient states she has had  epigastric pain off and on for about a year.  She describes the pain as  just a hurt.  It is intermittent in nature.  Sometimes the pain is worse  with fasting.  Sometimes the pain is worse with meals.  She notes when  she lies down at night it seems to be worse but she is not sure if that  is just because she is fixated on it.  Whenever she turns in the bed she  has more pain but then it subsides.  She does have some intermittent  breakthrough heartburn symptoms on omeprazole 20 mg daily.  She  complains of recurrent progressive dysphagia.  She has required multiple  esophageal dilations in the past which has improved her dysphagia  symptoms but there has  never been evidence of a stricture.  Her last  dilation was in 2007.  She does have a history of gastric ulcer in 2005  with documented healing.  Helicobacter pylori serologies were negative  in 2005 and 2007.  She has chronic postprandial stools 4-5 times a day.  Most of the time the stool is solid but many times will progress to  loose to watery.  Denies any blood in the stool or melena.  She states  that she has been on prednisone off and on all of her life for her  asthma.  She feels like she has been on it more than not.  The last 5  weeks she has been on prednisone for a new diagnosis of polymyalgia  rheumatica.  She is currently on 30 mg daily.  A couple weeks ago she  was started on methotrexate.   MEDICATIONS AT HOME:  1. Diltiazem ER 240 mg daily.  2. Diovan 320 mg daily.  3. Digoxin 250 mg daily.  4. Lasix 80 mg daily.  5. Potassium chloride 20 mEq daily.  6. Synthroid 50 mcg daily.  7. Vytorin 10/20 mg daily.  8. Singulair 10 mg daily.  9. Aspirin 81 mg daily.  10.Omeprazole 20 mg daily.  11.Prednisone 30 mg daily.  12.Methotrexate 10 mg q.week.  13.Vitamin D dose unknown.   ALLERGIES:  ZYFLO, AMOXICILLIN, SERZONE all with intolerances.   PAST MEDICAL HISTORY:  CAD, with history of MI and multiple stentings,  history of paroxysmal atrial fibrillation on Coumadin previously but  most recently on aspirin.  Recent admission in March of 2010.  Hypertension, hypercholesterolemia, diabetes mellitus, hypothyroidism,  asthma, polymyalgia rheumatica, history of esophageal dilatations in the  past and last one in 2007 by Dr. Laural Golden with [address] Isabell Jarvis dilator.  She had antral gastritis at that time.  She has a small hiatal hernia.  History of gastric ulcer in 2005 with documented healing.  Helicobacter  pylori serologies have been negative.  She has had appendectomy, total  abdominal hysterectomy with bilateral salpingo-oophorectomy,  cholecystectomy, breast reduction,  knee surgery, cleft palate repair,  benign nodule removed from vocal cord, recent right temporal artery  biopsy, tubular adenoma removed from colon in 2005, history of ischemic  colitis in 1999.   FAMILY HISTORY:  Mother and father both succumbed to MI in 68s and 55s.  Sister died with metastatic lung cancer.  Mother died with lymphoma.  Paternal aunt, maternal uncle both with colon cancer.  Paternal aunt  with leukemia and breast cancer.   SOCIAL HISTORY:  She is widowed, no children, remote tobacco use.  No  alcohol use or drug use.  Retired.   PHYSICAL EXAMINATION:  VITAL SIGNS:  Temperature 97.9, pulse 79,  respirations 20, blood pressure 116/63, O2 sats 97% on 2 liters.  She  had 8 BMs yesterday.  Height 63 inches.  Weight 117.2 kilograms.  SKIN:  Warm and dry.  No jaundice.  HEENT:  Sclerae nonicteric.  Oropharyngeal mucosa moist and pink.  Dentition in poor repair.  CHEST:  Distant breath sounds.  No wheezing.  CARDIAC:  Regular rate and rhythm.  No murmurs appreciated.  ABDOMEN:  Obese.  Mild epigastric tenderness to deep palpation.  No  rebound or guarding.  Did not appreciate hepatosplenomegaly or masses  due to body habitus.  No abdominal hernias or bruits.  LOWER EXTREMITIES:  No edema.   LABS:  White count 12,100, hemoglobin 11.7, platelets 170,000, sodium  143, potassium 3.3, BUN 19, creatinine 0.9, glucose 100, D-dimer 2.56,  cardiac enzymes negative x3, LFTs normal except albumin of 3.1, TSH  1.365.  CTA of the chest extensive pulmonary emboli throughout.   IMPRESSION:  The patient is a 70 year old female with acute pulmonary  emboli who has complaints of chronic epigastric pain and progressive  dysphasia.  She will need chronic Coumadin therapy.  She has a history  of remote gastric ulcer in 2005 with documented healing, prior  Helicobacter pylori serologies negative.  She has a history of requiring  multiple esophageal dilations but no documented strictures  previously.  Dilation has seemed to help.  She has chronic gastroesophageal reflux  disease with some breakthrough symptoms on omeprazole 20 mg daily.  Differential diagnosis included possible peptic ulcer disease,  refractory gastroesophageal reflux disease, pill induced esophagitis,  dyspepsia or secondary to steroids.  Given her acute pulmonary emboli I  suspect the plan will be for barium studies only.  She also has history  of tubular adenoma in 2005.  Second degree relatives with colon  cancer.   RECOMMENDATIONS:  1. Continue proton pump inhibitor b.i.d. for now and at home.  2. Will discuss further with Dr. Stann Mainland regarding barium study such as      barium pill esophagogram and upper GI series versus EGD.  Would      recommend a colonoscopy within the next year given personal history      and family history.   ADDENDUM 91478:  RPV on 61410 w/ LL. Need to discuss the risks of coming  off Coumadin sooner. Soft mechanical diet for 6 months: meats need to be  ground or chopped. Schedule OPV w/ [facility] in Nov 2010. Plan TCS/EGD-  dilation November 2010-off anticoagulation. Will need to hold Coumadin  for 5 days. Consider Lovenox sq as an outpt if anticoagulation planned  for > 6 mos.      Neil Crouch, P.A.      Caro Hight, M.D.  Electronically Signed    LL/MEDQ  D:  10/19/2008  T:  10/19/2008  Job:  [id number]   cc:   Nicki Reaper A. Wolfgang Phoenix, MD  Fax: [phone]

## 2010-11-06 NOTE — H&P (Signed)
[name], [name]                ACCOUNT NO.:  [id number]   MEDICAL RECORD NO.:  [id number]          PATIENT TYPE:  INP   LOCATION:  A334                          FACILITY:  [facility]   PHYSICIAN:  Margaretmary Eddy, M.D.DATE OF BIRTH:  07-Apr-1941   DATE OF ADMISSION:  10/17/2008  DATE OF DISCHARGE:  LH                              HISTORY & PHYSICAL   CHIEF COMPLAINT:  Dyspnea.   SUBJECTIVE:  This patient is a 70 year old Caucasian female with a  history of asthma, hypothyroidism, hypertension, coronary artery  disease, hyperlipidemia, type 2, diet, atrial fibrillation, venous  stasis who presents to the office the day of admission with complaints  of shortness of breath.  The patient notes with simple exertion she  becomes exquisitely short of breath.  She has no accompanying nausea or  diaphoresis.  No true chest pain.  She notes that she had similar  shortness of breath with minimal exertion the last time she developed a  flare of her coronary artery disease.  The patient's compliant with her  many chronic medications which include Singulair 10 q.h.s. levothyroxine  50 mcg daily, __________, Diltiazem 240 mg q.a.m., Lasix 80 mg daily,  __________ 10/21 daily, K-Tab 20 mEq daily, Ventolin 2.5 mg nebulizer  q.4 p.r.n., Flovent 2 sprays 110 mcg b.i.d., Diovan 320 mg q.a.m.,  prednisone currently high-dose 30 mg due to polymyalgia rheumatica,  Xanax 0.5 mg p.r.n. anxiety, Lanoxin 250 mcg p.o. daily, aspirin 81 mg  daily.   PAST SURGERIES:  Remote hysterectomy and bilateral oophorectomy,  appendectomy and bilateral tubes, focal cord polyps, remote  cholecystectomy and breast reduction.   FAMILY HISTORY:  Family history significant for breast cancer, colon  cancer, hypertension, type 2 diabetes, coronary artery disease, lung  cancer.   ALLERGIES:  SENSITIVE TO A TREMENDOUS NUMBER OF MEDICATIONS.   SOCIAL HISTORY:  The patient is widowed.  No children.  No tobacco or  alcohol  use.   REVIEW OF SYSTEMS:  Otherwise negative.   PHYSICAL EXAMINATION:  VITAL SIGNS:  Blood pressure repeat 152/92.  GENERAL:  Alert in no acute distress.  HEENT: Normal.  NECK:  Supple.  No JVD.  LUNGS:  No wheezes.  No crackles.  No tachypnea.  HEART:  Rhythm currently regular.  No significant murmurs.  ABDOMEN: Nontender.  No rebound, __________.  EXTREMITIES: Trace edema at most.  NEURO:  Focal neurological deficits.   LABORATORY DATA:  EKG normal sinus rhythm with borderline low voltage in  the limb leads. Blood work pending.   IMPRESSION:  1. Dyspnea very similar to the patient's coronary artery disease      presentation in the past.  2. Coronary artery disease.  3. Hypertension.  4. Type 2 diabetes.  5. History of A fib currently in sinus rhythm.  6. Hypothyroidism.  7. Hyperlipidemia.  8. Insomnia.   PLAN:  Cardiology consult, rule out protocol. Nasal cannula O2. KI p.o.  CC&T.  Further orders as noted in chart thank you      W. Rosemary Holms, M.D.  Electronically Signed     WSL/MEDQ  D:  10/18/2008  T:  10/18/2008  Job:  [id number]

## 2010-11-06 NOTE — H&P (Signed)
NAMEAVIV, SALZ                ACCOUNT NO.:  [id number]   MEDICAL RECORD NO.:  [id number]          PATIENT TYPE:  INP   LOCATION:  A308                          FACILITY:  [facility]   PHYSICIAN:  Margaretmary Eddy, M.D.DATE OF BIRTH:  [birth year]   DATE OF ADMISSION:  03/12/2008  DATE OF DISCHARGE:  LH                              HISTORY & PHYSICAL   CHIEF COMPLAINT:  Cannot breathe.   SUBJECTIVE:  This patient is a 69 year old white female with history of  quite a few complicated chronic medical problems including coronary  artery disease, hypertension, hypothyroidism, severe asthma, reflux,  hyperlipidemia, type 2 diabetes, atrial fibrillation in the past, and  intermittent hypokalemia.  The patient derived to the office the day of  admission with complaints of very severe difficulties in breathing.  She  states she was up all night taking her breathing treatments every 2-3  hours.  The patient had a cough that was productive of phlegm at times  over the last couple of days.  The patient noted no fever.  The patient  states she got very low sleep the night before.  No GI symptoms.  No  headache.  No myalgias   FAMILY HISTORY:  Positive for breast cancer, colon cancer, hypertension,  coronary artery disease, type 2 diabetes, and lung cancer.   PAST SURGERIES:  1. Remote cleft lip and palate surgery.  2. Remote hysterectomy and bilateral oophorectomy.  3. Remote appendectomy.  4. Tubes in the ears.  5. Remote cholecystectomy.  6. Remote breast reduction.  7. The patient had stents placed in 2004.   SOCIAL HISTORY:  The patient is widowed.  No children.  No alcohol or  tobacco use.   MEDICATIONS:  The patient claims compliance with her current  medications, which include;  1. Singulair 10 mg nightly.  2. Levothyroxine 50 mcg daily.  3. Diltiazem 360 mg daily.  4. Lasix 80 mg q.a.m.  5. Vytorin 10/20 1 nightly.  6. Ventolin 2.5 per nebulizer p.r.n.  7. Nexium 40 mg each  day.  8. K-Tab 20 mEq q.a.m.  9. Flovent 110 mics per spray, 2 sprays b.i.d.   REVIEW OF SYSTEMS:  Otherwise, negative.   PHYSICAL EXAMINATION:  GENERAL:  Alert, acute tachypnea, and air hunger  noted.  VITAL SIGNS:  BP 104/88, afebrile and weight 269.  HEENT:  Mild nasal congestion.  Changes of old lip surgery noted.  NECK:  Supple.  No JVD.  LUNGS:  Bilateral impressive wheezes.  Significant tachypnea.  No  improvement after breathing treatment in the office.  HEART:  Mild tachycardia.  Positive flow murmur.  ABDOMEN:  Enlarged.  No distinct tenderness.  NEURO:  Intact.  SKIN:  Intact.   SIGNIFICANT LABS:  Chest x-ray; no true infiltrate, some bronchitic  changes.   IMPRESSION:  1. Exacerbation of asthma with failure on outpatient therapy.  2. Coronary artery disease.  3. Type 2 diabetes.  4. Hypertension.  5. Hyperlipidemia.  6. Hypothyroidism   PLAN:  Admit for IV steroids, insulin sliding scale to cover her sugars,  oral Levaquin, frequent neb  treatments with Atrovent and Xopenex.  Further orders as noted in the chart.      Margaretmary Eddy, M.D.  Electronically Signed     WSL/MEDQ  D:  03/13/2008  T:  03/14/2008  Job:  [id number]

## 2010-11-06 NOTE — Discharge Summary (Signed)
NAMENANDA, CZAP                ACCOUNT NO.:  [id number]   MEDICAL RECORD NO.:  [id number]          PATIENT TYPE:  INP   LOCATION:  A313                          FACILITY:  [facility]   PHYSICIAN:  Scott A. Wolfgang Phoenix, MD    DATE OF BIRTH:  31-Jan-1941   DATE OF ADMISSION:  09/14/2008  DATE OF DISCHARGE:  03/26/2010LH                               DISCHARGE SUMMARY   DISCHARGE DIAGNOSES:  1. Atrial fibrillation with rapid ventricular response.  2. Prednisone tendency secondary to polymyalgia rheumatica.  3. History of hyperthyroidism.  4. Hyperlipidemia.  5. Hypertension.  6. Asthma.   HOSPITAL COURSE:  The patient was admitted with shortness of breath and  rapid heart rate irregular, was treated in the ER with Cardizem and they  are able to get a heart rate slowed down.  Cardiac enzymes were  negative.  BNP was minimally elevated, was not felt to be in CHF.  The  patient has spontaneously converted back to sinus rhythm.  Cardiology  saw her, did not feel that the patient need to be on Coumadin and have  been felt that the patient would be best served on diltiazem 240 mg  daily and also increase the digoxin to 0.25 daily and they want to  follow her up with somewhere in the next few weeks and do an event  monitor as well, then they will also follow her dig levels and follow up  with Dr. Annie Main in 7-14 days.   DISCHARGE MEDICATIONS:  1. Prednisone 50 mg daily.  2. Diovan 320 mg daily.  3. Diltiazem 240 mg ER 1 daily.  4. Levothyroxine 50 mcg daily.  5. Singulair 10 mg daily.  6. Furosemide 80 mg daily.  7. K-Dur 20 mEq daily.  8. Vytorin 10/20 daily.  9. Aspirin 81 mg daily.  10.Digoxin 0.25 daily.  11.Flovent 2 puffs twice a day.  12.B12 sublingual daily.  13.Tylenol p.r.n.  14.Vitamin D 50,000 units weekly.   Outpatient procedures to be set up by [place] includes AMFIT monitoring  and following up with Cardiology after that.  Also follow up with Dr.  Rosemary Holms somewhere  in the next 7-14 days.  Also [facility]  to follow up dig levels as well.  The patient is stable for discharge  and instructed to report to the ER.  If emergency problems, call 911, if  not emergency notify [place] or notify us.      Scott A. Wolfgang Phoenix, MD  Electronically Signed     SAL/MEDQ  D:  09/16/2008  T:  09/16/2008  Job:  [id number]

## 2010-11-06 NOTE — Letter (Signed)
April 25, 2008    W. Rosemary Holms, MD  [address], [place], [place]   RE:  [name], [name]  MRN:  [id number]  /  DOB:  [birth year]   Dear [name],   [name] returns to the office at her request due to the fact that  she has developed substantial pedal edema.  She has apparently had this  problem before, which has been controlled with furosemide.  There have  been no changes in her medication.  She reports some increase in  activity - she has been walking more.  She does not follow a  particularly low-salt diet, but has not changed her diet.   CURRENT MEDICATIONS:  1. Singulair 10 mg daily.  2. Levothyroxine 0.05 mg daily.  3. Vytorin 10/20 mg daily.  4. Diltiazem 360 mg daily.  5. KCl 40 mEq daily.  6. Aspirin 81 mg daily.  7. Furosemide 80 mg daily.  8. Digoxin 0.125 mg alternating with 0.25 mg daily.  9. Flovent 2 puffs b.i.d.   PHYSICAL EXAMINATION:  GENERAL:  A pleasant overweight woman, in no  acute distress.  VITAL SIGNS:  The weight is 269, 3 pounds more than in October.  Blood  pressure 150/85, heart rate 85 and regular, and respirations 14.  NECK:  No jugular venous distention.  LUNGS:  Clear with decreased breath sounds at the bases.  CARDIAC:  Regular rhythm; normal first and second heart sounds;  questionable fourth heart sound.  ABDOMEN:  Soft and nontender; no organomegaly.  EXTREMITIES:  A 2+ pitting ankle and pretibial edema with some  tenderness.   IMPRESSION:  [name] has pedal edema without other evidence for  congestive heart failure.  A BNP level was 180 in May, which is not  particularly worrisome.  A recent digoxin level was good at 0.7.  A  recent lipid profile was excellent.   I have asked [name] to be more diligent about following a low-salt  diet.  She will try some leg elevation.  We will decrease diltiazem to  180 mg daily, add lisinopril 20 mg daily for better control of blood  pressure and  because this sometimes has a salutary effect on pedal  edema, I had increased furosemide to 80 mg q.a.m. and 40 mg q.p.m.  A  chemistry profile will be obtained in 2 weeks with a return office visit  in 1 month.    Sincerely,      Cristopher Estimable. Lattie Haw, MD, [facility]  Electronically Signed    RMR/MedQ  DD: 04/25/2008  DT: 04/26/2008  Job #: [id number]

## 2010-11-06 NOTE — Discharge Summary (Signed)
[name], [name]                ACCOUNT NO.:  [id number]   MEDICAL RECORD NO.:  [id number]          PATIENT TYPE:  INP   LOCATION:  A336                          FACILITY:  [facility]   PHYSICIAN:  Bonnielee Haff, MD     DATE OF BIRTH:  1940-12-02   DATE OF ADMISSION:  01/04/2008  DATE OF DISCHARGE:  07/18/2009LH                               DISCHARGE SUMMARY   The patient's PMD is Dr. Mickie Hillier.   DISCHARGE DIAGNOSES:  1. Acute asthmatic exacerbation, improved.  2. History of hypertension, dyslipidemia, type 2 diabetes. All stable.   BRIEF HOSPITAL COURSE:  Briefly, this is a 70 year old Caucasian female  who is morbidly obese who has a history of asthma who presented with  worsening shortness of breath. The patient was admitted to the hospital  by Dr. Wolfgang Phoenix, started on nebulizer treatments, antibiotics, steroids.  The patient improved slowly. Over the last couple of days, she has been  feeling much better. Yesterday, she was still wheezing quite a bit  according to Dr. Wolfgang Phoenix, and so he kept her 1 more day. This morning,  the patient is feeling much better. She states she is still wheezing but  not as bad as she was when she came in. She has been able to ambulate  with difficulty. Lungs do show some wheezing but very scattered. Good  air entry is present bilaterally. Cardiovascular exam was unremarkable.  So, overall, I think the patient is improved. I asked her that if she  feels better to go home, and she says that she is, so I will be going  ahead and discharging her at this time. She lives alone, but her sister  lives across the street from her and provides good social support  system.   Her blood sugars were a little bit elevated because of the steroids for  which she required Lantus in the hospital. I do not believe she takes  any insulin at home.   DISCHARGE MEDICATIONS:  1. Prednisone taper 40 mg daily for 3 days, followed by 30 mg daily      for 3 days, followed by  20 mg daily for 2 days.  2. Levaquin 500 mg p.o. daily for 5 days.  3. Cardizem CD 360 mg daily.  4. Synthroid 50 mcg daily.  5. Singulair 10 mg daily.  6. Lasix 80 mg b.i.d.  7. Klor-Con 20 mEq daily.  8. Vytorin 10 x21 tablets daily.  9. Aspirin 81 mg daily.  10.Albuterol nebulizers 4 times a day.   FOLLOW UP:  I told her to call Dr. Lance Sell office on Monday to set a  scheduled appointment this coming week.   DIET AND PHYSICAL ACTIVITY:  As before.   TOTAL TIME ON DISCHARGE:  35 minutes.      Bonnielee Haff, MD  Electronically Signed     GK/MEDQ  D:  01/09/2008  T:  01/09/2008  Job:  [id number]   cc:   Margaretmary Eddy, M.D.  Fax: [phone]

## 2010-11-06 NOTE — Letter (Signed)
October 12, 2007    W. Rosemary Holms, M.D.  [address] [place], [place] [address]   RE:  JAKISHA, HAMS  MRN:  [id number]  /  DOB:  1940/09/25   Dear Richardson Landry:   Ms. Fairbairn is seen in the office following a recent visit to the  emergency department with recurrent atrial fibrillation.  She has not  had a documented episode since 2005.  She was at rest and comfortable  when she noted the onset of palpitations associated with dyspnea.  She  tolerated the symptoms for 2 hours but then summoned EMS who transported  her to the emergency department where she was found to have atrial  fibrillation with a rapid ventricular response despite faithfully taking  diltiazem 240 mg daily.  She was given in an additional intravenous dose  of 20 mg, and reported weekly with improvement of heart rate.  Her  physician note indicates that she was sent home in atrial fibrillation,  but the patient reports that her symptoms resolved while she was in the  emergency department.  She has done well since without subsequent  difficulties.  She has noted a decrease in chronic edema.  Where she had  previously taking up to 80 mg twice a day of Lasix, she has not been  taking any in recent weeks.  She was told that prednisone could cause  atrial fibrillation and has discontinued that medication.  Albuterol by  nebulizer was switched to Xopenex.   Her other medications include:  1. Singulair 10 mg daily.  2. Levothyroxine 0.05 mg daily.  3. Vytorin 10/20 mg daily.  4. KCl 20 mEq daily.  5. Aspirin 81 mg daily.   On exam, very pleasant overweight woman in no acute distress.  The  weight is 262, 30 pounds more than in October 2007.  Blood pressure  125/65, heart rate 70 and regular.  Respirations 13.  NECK:  No jugular venous distention; no carotid bruits.  LUNGS:  Clear with some prolongation of the expiratory phase.  CARDIAC:  Normal first and second heart sounds.  Minimal basilar  systolic ejection  murmur.  ABDOMEN:  Soft and nontender; no organomegaly.  EXTREMITIES:  1/2+ ankle edema.   RHYTHM STRIP:  Normal sinus rhythm; normal intervals.   IMPRESSION:  Ms. Alexis has had recurrent atrial fibrillation that was  fairly symptomatic.  She improved with better control of heart rate.  She believes that she has similar episodes lasting just a few minutes  every few weeks.  In the past, we have been unable to document that with  an event recorder.  In the absence of significant cardioembolic risk  factors, I do not believe that she needs to resume chronic  anticoagulation with warfarin.  We will increase diltiazem to 360 mg  daily.  She is not a candidate for beta blocker therapy due to severe  asthma.  We could consider an ACE inhibitor, which decreases the risk of  recurrent atrial fibrillation; however, in light of her very rare  episodes, it would be difficult to tell if such a medications were  effective.  She will continue to use furosemide on a p.r.n. basis for  peripheral edema and return to see me in 6 months.  A lipid profile was  obtained in January and was excellent.    Sincerely,      Cristopher Estimable. Lattie Haw, MD, [facility]  Electronically Signed    RMR/MedQ  DD: 10/12/2007  DT: 10/12/2007  Job #: [id number]

## 2010-11-06 NOTE — Discharge Summary (Signed)
[name], [name]                ACCOUNT NO.:  [id number]   MEDICAL RECORD NO.:  [id number]          PATIENT TYPE:  INP   LOCATION:  A334                          FACILITY:  [facility]   PHYSICIAN:  Margaretmary Eddy, M.D.DATE OF BIRTH:  [birth year]   DATE OF ADMISSION:  10/17/2008  DATE OF DISCHARGE:  05/02/2010LH                               DISCHARGE SUMMARY   FINAL DIAGNOSES:  1. Bilateral pulmonary embolism.  2. Solid food dysphagia.  3. Type 2 diabetes.  4. Asthma.  5. Polymyalgia rheumatica.  6. Reflux.  7. Hypothyroidism.  8. Hyperlipidemia.  9. History of atrial fibrillation.  10.Coronary artery disease.   FINAL DISPOSITION:  1. The patient discharged home.  2. The patient to take one-half Coumadin dose today at 2.5 mg, then 5      mg a day starting tomorrow.  3. The patient to follow up with [facility] for anticoagulation test      on Tuesday.  4. The patient to follow up in our office on Thursday.  5. Low salt, low cholesterol diabetic diet along with special diet for      those on Coumadin.  6. The patient will follow up with Dr. Stann Mainland in 1 month.  7. The patient will be following up with myself, Dr. Mickie Hillier, for      long-term management of anticoagulation through INR checks through      the office.   DISCHARGE MEDICATIONS:  Maintain all meds except for the following  decrease;  1. Lasix to 40 mg daily.  2. Coumadin, maintain at 5 mg daily.  3. Maintain other chronic meds as already on upon presentation except      for aspirin.  The patient advised to stop aspirin.   HISTORY AND PHYSICAL:  Initial H&P, please see H&P as dictated.   HOSPITAL COURSE:  This patient is a complicated very nice 70 year old  female with history of multiple medical problems as noted above, who  presented to the office the day of admission with progressive shortness  of breath.  She noted significant shortness of breath with exertion.  This remind her the similar symptoms she  developed when she had her  first presentation with coronary artery disease.  Based on this, we  admitted her to the hospital.  Significant testing was done.  This  revealed pulmonary embolus which was extensive.  Cardiologist felt this  was the likely source of her troubles.  The patient was maintained on  Lovenox throughout the hospitalization at therapeutic dosages due to GI  symptoms.  Dr. Stann Mainland consulted the patient.  She felt that in a month  from now will need further evaluation due to her current symptomatology.  She did not recommend an endoscopy.  She did perform an upper barium  enema, which revealed a symptomatic Schatzki ring.  The patient was  advised to chop up all her meats carefully.  The patient slowly improved  in terms of her dyspnea.  We did a blood gas that showed pO2 79 on room  air.  We followed her glucoses, generally they remained  in the well  100s.  Today, the day of discharge, the patient is feeling better.  She  is clinically stable.  She was discharged home with diagnosis and  disposition as noted above.      Margaretmary Eddy, M.D.  Electronically Signed     WSL/MEDQ  D:  10/23/2008  T:  10/24/2008  Job:  [id number]

## 2010-11-06 NOTE — H&P (Signed)
[name], [name]                ACCOUNT NO.:  [id number]   MEDICAL RECORD NO.:  [id number]          PATIENT TYPE:  INP   LOCATION:  A336                          FACILITY:  [facility]   PHYSICIAN:  Margaretmary Eddy, M.D.DATE OF BIRTH:  18-Nov-1940   DATE OF ADMISSION:  01/04/2008  DATE OF DISCHARGE:  LH                              HISTORY & PHYSICAL   CHIEF COMPLAINT:  Cannot breathe.   SUBJECTIVE:  This patient is a 70 year old white female with a history  of asthma, hypothyroidism, hypertension, coronary artery disease,  reflux, type 2 diabetes mellitus who arrives to the office on the day of  admission with progressive shortness of breath.  The patient was seen a  week prior with congestion and drainage.  At that time, appeared to have  cough and wheeziness and was placed on some antibiotics along with a  prednisone taper.  Overall, she has had a difficult week, continues to  have significant wheezing, was up all last night wheezing and coughing,  notes trouble breathing and shortness of breath and air hunger.  The  slightest activity leaves her with shortness of breath.  The patient has  had no chest pain.  She has been using nebulizer treatments  approximately every 3 hours, Xopenex 1.25 mg.  It should be noted she  states she is compliant all her current meds, which include Singulair 10  mg p.o. q.h.s., Levothroid 50 mcg p.o. daily, Diltiazem 360 mg p.o.  daily, Lasix 80 mg p.o. q.a.m., Vytorin 10/21 p.o. q.h.s., Nexium 40 mg  1 p.o. q.a.m., K tablet 20 mEq 1 daily, Xopenex 1.25 mg p.r.n. for  wheezing.   FAMILY HISTORY:  Positive for breast cancer and colon cancer,  hypertension, type 2 diabetes, heart disease, hyperlipidemia.   ALLERGIES:  Multiple medicine sensitivities.   PAST SURGERIES:  1. Remote hysterectomy with bilateral oophorectomy.  2. Remote appendectomy.  3. Tubes in the ears.  4. Remote cholecystectomy.  5. Breast reduction bilateral and 0-0.   SOCIAL:   The patient is widowed.  No tobacco or alcohol use.  No  children.   REVIEW OF SYSTEMS:  Otherwise negative.   PHYSICAL EXAM:  Blood pressure 120/80, afebrile.  This patient is alert, some malaise.  HEENT:  Slightly dry mucous membranes, mild nasal congestion.  TMs  normal.  Fundi disks sharp.  NECK:  Supple.  LUNGS:  Impressive bilateral wheezes.  No crackles, moderate tachypnea  noted.  HEART:  Mild tachycardia.  Mild flow murmur, 2/6 at the left sternal  border.  ABDOMEN:  Large, no distinct tenderness.  ANKLES:  Trace edema.  No focal neurological deficits.   BLOOD WORK:  Pending.   CHEST X-RAY:  No true infiltrate bronchitis type changes.   IMPRESSION:  1. Exacerbation of asthma with at least bronchitis.  Some of the      symptoms last week may well have been her own particular take on      the flu.  2. Type 2 diabetes.  3. Coronary artery disease.  4. Hypertension.  5. Reflux.  6. History of atrial fibrillation  and chronic anticoagulation,      currently stable.   PLAN:  As per orders.  Further orders noted on the chart.      Margaretmary Eddy, M.D.  Electronically Signed     WSL/MEDQ  D:  01/05/2008  T:  01/05/2008  Job:  [id number]

## 2010-11-06 NOTE — Letter (Signed)
July 18, 2008    W. Rosemary Holms, MD  [address] [place], [place] [address]   RE:  [name], [name]  MRN:  [id number]  /  DOB:  1941/02/03   Dear Richardson Landry,   [name] returns to the office for continued assessment and treatment  of coronary disease, paroxysmal atrial fibrillation, and multiple  cardiovascular risk factors.  Since her last visit, she has been  diagnosed with polymyalgia rheumatica and started on prednisone,  currently at a dose of 15 mg daily.  This has resulted in a vast  improvement in her sense of well being as well as discomfort in her  lower extremities.  She has no orthopnea nor PND.  She denies exertional  dyspnea or chest pain.   Medications are unchanged from her last visit except for the  substitution of losartan 50 mg daily for lisinopril.  This has resulted  in resolution of her cough; however, her insurance company refuses to  pay for this drug and does not indicate what ARB they will cover.   Most recent chemistry profile was in November and was normal.   PHYSICAL EXAMINATION:  GENERAL:  Overweight, pleasant woman in no acute  distress.  VITAL SIGNS:  Her weight is 255, 2 pounds less than at her last visit,  blood pressure 145/80, heart rate 88 and regular, respirations 14.  NECK:  No jugular venous distention; no carotid bruits.  LUNGS:  Clear.  CARDIAC:  Normal first and second heart sounds; fourth heart sound  present.  ABDOMEN:  Soft and nontender; normal bowel sounds.  EXTREMITIES:  1/2+ ankle edema with compression stockings in place;  preserved distal pulses.   IMPRESSION:  [name] is doing extremely well with current therapy.  Blood pressure control is slightly suboptimal.  We are attempting to  contact her insurance company to find mother antihypertensive that will  workup well for her.  Edema is well controlled.  We will follow  electrolytes and renal function.  Her insurer also seeks to deny  coverage for Vytorin.  She  was previously intolerant to a higher dose of  simvastatin as well as other statins.  This drug is controlling her  lipids and should be continued.   Otherwise, we will make no change in her medical regime.  I will plan to  see this nice woman again in 5 months.    Sincerely,      Cristopher Estimable. Lattie Haw, MD, [facility]  Electronically Signed    RMR/MedQ  DD: 07/18/2008  DT: 07/19/2008  Job #: [id number]

## 2010-11-06 NOTE — Discharge Summary (Signed)
[name], BREITENSTEIN                ACCOUNT NO.:  [id number]   MEDICAL RECORD NO.:  [id number]          PATIENT TYPE:  INP   LOCATION:  A316                          FACILITY:  [facility]   PHYSICIAN:  Margaretmary Eddy, M.D.DATE OF BIRTH:  12-15-1940   DATE OF ADMISSION:  03/16/2008  DATE OF DISCHARGE:  09/24/2009LH                               DISCHARGE SUMMARY   FINAL DIAGNOSES:  1. Atrial fibrillation with rapid ventricular response.  2. Recent exacerbation of asthma.  3. Type 2 diabetes.  4. Hypertension.  5. Hyperlipidemia.   FINAL DISPOSITION:  1. Patient discharged to home.  2. Patient will follow up in the office in 1 week.  3. The patient will maintain usual meds plus baby aspirin 81 mg daily      and Lanoxin 0.125 mg daily.  4. Patient to follow up with cardiologist in 2 weeks.   INITIAL HISTORY AND PHYSICAL:  Please see H&P as dictated.   HOSPITAL COURSE:  This patient is a 70 year old female with complicated  medical history who was readmitted to the hospital just 1 day after  discharge for exacerbation of asthma.  She had a very rapid heart rate.  She was started on IV calcium channel blockers.  She converted to normal  sinus rhythm.  [name] saw her, he recommended adding Lanoxin, this  was done.  The patient was discharged home with diagnosis and  disposition as noted above.      Margaretmary Eddy, M.D.  Electronically Signed     WSL/MEDQ  D:  04/17/2008  T:  04/17/2008  Job:  [id number]

## 2010-11-07 ENCOUNTER — Ambulatory Visit (HOSPITAL_COMMUNITY)
Admission: RE | Admit: 2010-11-07 | Discharge: 2010-11-07 | Disposition: A | Discharge status: Home / Self Care | Payer: Medicare Other | Source: Ambulatory Visit | Attending: Family Medicine | Admitting: Family Medicine

## 2010-11-08 ENCOUNTER — Ambulatory Visit (HOSPITAL_COMMUNITY)
Admission: RE | Admit: 2010-11-08 | Discharge: 2010-11-08 | Disposition: A | Discharge status: Home / Self Care | Payer: Medicare Other | Source: Ambulatory Visit | Attending: Family Medicine | Admitting: Family Medicine

## 2010-11-09 NOTE — Procedures (Signed)
   NAME:  [name], [name]                            ACCOUNT NO.:  [id number]   MEDICAL RECORD NO.:  [id number]                  PATIENT TYPE:   LOCATION:                                       FACILITY:   PHYSICIAN:  Margaretmary Eddy, M.D.             DATE OF BIRTH:   DATE OF PROCEDURE:  07/02/2002  DATE OF DISCHARGE:                                    STRESS TEST   PROCEDURE:  Dobutamine Cardiolite stress test.   INDICATIONS FOR TEST:  This patient is a 70 year old white female with  multiple risk factors and chest pain at times with exertion.   STRESS TEST:  Stress test was performed with a dobutamine Cardiolite  approach due to the patient's history of both asthma and musculoskeletal  considerations which preclude exercise on the treadmill.  The patient's  resting EKG revealed normal sinus rhythm with no significant ST-T changes.  Dobutamine was utilized.  Unfortunately, the patient did have some side  effects with the dobutamine.  Her maximum heart rate achieved was 127 with  her submax predicated heart rate at 133.  The EKG at this point revealed  nonspecific and nondiagnostic ST-T changes.  Subsequent EKG showed  occasional PVC's with no significant ST-T changes.  The patient was sent on  to nuclear medicine for further delineation.   IMPRESSION:  Dobutamine Cardiolite stress test with no significant  electrocardiographic changes.   PLAN:  Await nuclear medicine portion of the test.                                               W. Rosemary Holms, M.D.    WSL/MEDQ  D:  08/31/2002  T:  08/31/2002  Job:  [id number]

## 2010-11-09 NOTE — Cardiovascular Report (Signed)
[name], [name]                          ACCOUNT NO.:  [id number]   MEDICAL RECORD NO.:  [id number]                   PATIENT TYPE:  INP   LOCATION:  2016                                 FACILITY:  [facility]   PHYSICIAN:  Junious Silk, M.D. [facility]         DATE OF BIRTH:  August 18, 1940   DATE OF PROCEDURE:  09/07/2003  DATE OF DISCHARGE:                              CARDIAC CATHETERIZATION   PROCEDURE PERFORMED:  1. Left heart catheterization with coronary angiography and left     ventriculography.  2. Percutaneous transluminal coronary angioplasty with cutting balloon     angioplasty followed by placement of a drug-eluting stent in the ostium     of the second diagonal branch.   INDICATION:  [name] is a 70 year old woman with history of previous  stents to the left anterior descending artery performed last year.  She had  a drug-eluting stent in the proximal LAD and a bare metal stent in the mid  LAD.  The bare metal stent was placed across the origin of a large second  diagonal branch.  She presented to [facility] two days ago with  symptoms of chest pain and ruled in for a non-Q-wave myocardial infarction.  She was therefore transferred to [facility] for cardiac  catheterization.   CATHETERIZATION PROCEDURAL NOTE:  A 6 French sheath was placed in the right  femoral artery.  Coronary angiography was performed with 6 [place] JL-4 and  JR-4 catheters.  Left ventriculography was performed with an angled pigtail  catheter.  Contrast was Omnipaque.  There were no complications.   CATHETERIZATION RESULTS:   HEMODYNAMICS:  1. Left ventricular pressure 140/22.  2. Aortic pressure 156/86.  3. There is no aortic valve gradient.   LEFT VENTRICULOGRAM:  Wall motion is normal.  Ejection fraction calculated  at 64%.  There is 1+ mild mitral regurgitation.   CORONARY ARTERIOGRAPHY (RIGHT DOMINANT):  Left main is normal.   Left anterior descending artery has a  stent in the proximal vessel which was  a Cypher drug-eluting stent.  This stent is widely patent with 0% stenosis  within the stent.  There is a stent in the mid vessel which is a bare metal  stent.  There is a diffuse 25% stenosis within this stent.  This stent  extends across the origin of a large second diagonal branch.  The LAD gives  rise to a very small first diagonal branch which is jailed by the first  stent.  There is a 95% stenosis in the ostium of the small first diagonal  branch.  There is a large second diagonal branch which is jailed by the  stent in the mid LAD.  There is a 95% stenosis at the ostium of the second  diagonal branch.   Left circumflex gives rise to a large first obtuse marginal and small second  obtuse marginal branch.  The left circumflex is  normal.   Right coronary artery gives rise to a normal size posterior descending  artery and normal size posterior lateral branch and normal size first  posterior lateral branch and a small second posterior lateral branch.  The  right coronary artery is normal.   IMPRESSION:  1. Normal left ventricular systolic function.  2. One-vessel coronary artery disease characterized by 95% stenosis in the     ostium of the second diagonal branch.  There are patent stents in the     left anterior descending.  However, the stent in the mid left anterior     descending is placed across the ostium of the second diagonal branch thus     jailing this diagonal branch.   PLAN:  Percutaneous intervention of the second diagonal.  See below.   PERCUTANEOUS TRANSLUMINAL CORONARY ANGIOPLASTY PROCEDURAL NOTE:  Following  completion of the diagnostic catheterization, we proceeded with percutaneous  coronary intervention.  We utilized the pre-existing 6 French sheath in the  right femoral artery.  Heparin and integrilin were administered per  protocol.  We used a 6 [place] JL-3.5 guiding catheter.  We advanced a PT2  light support wire  through the side struts of the stent in the mid LAD into  the second diagonal branch.  We then advanced a Hi-Torque Floppy into the  distal LAD.  We attempted to cross through the side struts of the stent in  the diagonal branch with a 2.5 x 15-mm Maverick balloon.  However, this  would not cross.  We then went in with a 1.5 x 15-mm Maverick balloon and  with some difficulty were able to cross into the second diagonal branch.  Percutaneous transluminal coronary angioplasty balloon was performed to 14  atmospheres.  We then went in with a 2.5 x 15-mm Maverick balloon and  crossed the ostium of the diagonal branch and performed two inflations to 6  and then 8 atmospheres.  Following this, we went in with a 2.5 x 6-mm  cutting balloon in the ostium of the second diagonal branch and performed  three inflations to 6, 8 and 8 atmospheres respectively.  We then performed  inflations in the stent in the mid LAD with this cutting balloon for three  inflations to 8 atmospheres and then one inflation in the distal stent to 6  atmospheres.  Following this, we performed kissing balloon angioplasty with  a 2.5 x 15-mm Maverick balloon and extended through the side struts of the  stent into the second diagonal branch and a 2.5 x 12-mm Quantum balloon in  the left anterior descending artery.  The Maverick balloon in the diagonal  was inflated to approximately 6 atmospheres and the Quantum and the LAD was  inflated to 6 atmospheres.  Angiographic images at that point revealed  significant improvement.  However, in the LAO cranial view there was  evidence of a residual dissection in the ostium of the diagonal with  significant residual stenosis.  We therefore advanced a 2.5 x 8-mm Taxus  drug-eluting stent into the second diagonal branch.  We carefully positioned  this stent across the ostium of the second diagonal branch and deployed the stent at 10 atmospheres.  Because of the angulation of the branch off  the  LAD, we were unable to cover the entire circumference of the diagonal branch  with the stent.  However, we did cover the more inferior aspect of the  ostium of the diagonal up to the carina of the vessel  at its bifurcation of  the LAD.  We then pulled out stent delivery balloon back slightly proximal  and inflated this to 10 atmospheres.  We also at that same time had a 2.5 x  12-mm Quantum balloon in the LAD across the origin of the diagonal branch  and inflated this to 10 atmospheres. Finally, we went in with a 2.5 x 8-mm  Quantum balloon in the stent in the diagonal branch and with our 2.5 x 12-mm  Quantum balloon in the stent in the LAD performed kissing balloon  angioplasty.  We initially performed percutaneous transluminal coronary  angioplasty of the stent in the diagonal branch to 15 atmospheres.  We then  did kissing balloon angioplasty with the balloon in the diagonal branch  inflated to 10 atmospheres in the balloon and LAD inflated to 12  atmospheres.  Final angiographic images were obtained revealing patency of  the diagonal branch and the LAD with less than 10% residual stenosis in the  second diagonal branch and TIMI-3 flow.  Patency of the LAD stent was  maintained.   At the conclusion of the procedure, an Angio-Seal vascular closure device  was placed in the right femoral artery with good hemostasis.   COMPLICATIONS:  None.   RESULTS:  Complex, but successful cutting balloon angioplasty followed by  placement of a drug-eluting stent in the ostium of the second diagonal  branch.  Kissing balloon angioplasty technique was utilized to preserve  patency of the left anterior descending.  The diagonal branch was reduced  from 95% stenosis to less than 10% residual with TIMI-3 flow.   PLAN:  Integrilin will be continued for 18 hours.  It is recommended that  the patient be treated with Plavix for 9 to 12 months.                                               Junious Silk, M.D. [facility]    MWP/MEDQ  D:  09/07/2003  T:  09/09/2003  Job:  [id number]   cc:   Margaretmary Eddy, M.D.  [address] [place] [address]  Fax: [phone]   Scarlett Presto, M.D.  Fax: [phone]

## 2010-11-09 NOTE — Procedures (Signed)
[name], [name]                ACCOUNT NO.:  [id number]   MEDICAL RECORD NO.:  [id number]          PATIENT TYPE:  OUT   LOCATION:  RAD                           FACILITY:  [facility]   PHYSICIAN:  Scarlett Presto, M.D.   DATE OF BIRTH:  12-01-1940   DATE OF PROCEDURE:  DATE OF DISCHARGE:                                  ECHOCARDIOGRAM   TAPE NUMBER:  [id number].   TAPE COUNT:  Is [id number] through [id number].   This is a woman with hypertension, diabetes, coronary artery disease, and  lower extremity edema.  Echo from last year shows normal LV systolic  function, mild RVH, no significant valvular heart disease.   Today's study is technically difficult.   M-MODE TRACING:  1.  The aorta is 29-mm.  2.  The left atrium is 43-mm.  3.  The septum is 14-mm.  4.  The posterior wall is 12-mm.  5.  Left ventricular diastolic dimension is 123XX123.  6.  Left ventricular systolic dimension [id number].   IMAGING TWO-DIMENSIONAL AND DOPPLER:  1.  The left ventricle is normal size with mild concentric left ventricular      hypertrophy.  The estimated ejection fraction is 60-65%.  There were no      obvious wall motion abnormalities.  2.  The right ventricle is normal size with normal systolic function.  There      appears to be free wall thickening consistent with RVH.  3.  Both atria appear to be enlarged.  4.  The aortic valve is mildly sclerotic with no stenosis or regurgitation.  5.  The mitral valve is mildly thickened with no stenosis or regurgiation.  6.  The Tricuspid valve not well seen.  7.  Pulmonic valve not well seen.  8.  No pericardial effusion.  9.  Inferior vena cava not well seen.  10. Ascending aorta not well seen.      JH/MEDQ  D:  10/23/2004  T:  10/23/2004  Job:  [id number]

## 2010-11-09 NOTE — Procedures (Signed)
NAME:  [name], [name]                ACCOUNT NO.:  [id number]   MEDICAL RECORD NO.:  [id number]          PATIENT TYPE:  OUT   LOCATION:  SLEE                          FACILITY:  [facility]   PHYSICIAN:  Kofi A. Merlene Laughter, M.D. DATE OF BIRTH:  [birth year]   DATE OF PROCEDURE:  DATE OF DISCHARGE:  10/13/2007                             SLEEP DISORDER REPORT   POLYSOMNOGRAPHY REPORT   REFERRING PHYSICIAN:  Scott A. Luking, MD   INDICATION:  This is a 70 year old lady who presents with daytime  sleepiness and snoring and has been evaluated for obstructive sleep  apnea syndrome.  BMI 41.  Epworth sleepiness scale 1.   MEDICATIONS:  Diltiazem, levothyroxine, furosemide, potassium,  Singulair, Vytorin, omeprazole, aspirin, temazepam.   SLEEP STAGE SUMMARY:  This was a split night study.  The total recording  time in the diagnostic portion is 201 minutes and the titration 171.  Sleep efficiency during the initial part is 71 and is 51 in the  titration portion.  Sleep latency is 29 minutes in the diagnostic and 80  in the titration study.  REM latency is 141 minutes in the diagnostic  portion.   RESPIRATORY SUMMARY:  This was a split study with the initial portion  being diagnostic and its second half was a titration study.  Baseline  oxygen saturation 96%.  Lowest saturation 81%.  During the initial part  of the evaluation, the patient has an AHI of 23 with all events being  hypopneas.  The patient was subsequently titrated between pressures of 5  and 10.  She is well on both 9 and 10, but appears to respond better to  pressure of 9 with the AHI being 49 and saturation 88.   LIMB MOVEMENT SUMMARY:  There was no significant limb movement observed  with the PLM index of 0.   ELECTROCARDIOGRAM SUMMARY:  Average heart rate is 56 with no  dysrhythmias observed.   IMPRESSION:  Moderate obstructive sleep apnea syndrome that responded  well to a CPAP pressure of 9.  Thanks for this  referral.      Kofi A. Merlene Laughter, M.D.  Electronically Signed     KAD/MEDQ  D:  10/24/2007  T:  10/25/2007  Job:  [id number]   cc:   Nicki Reaper A. Wolfgang Phoenix, MD  Fax: [phone]

## 2010-11-13 ENCOUNTER — Ambulatory Visit (HOSPITAL_COMMUNITY): Payer: Medicare Other | Admitting: *Deleted

## 2010-11-14 ENCOUNTER — Ambulatory Visit (HOSPITAL_COMMUNITY)
Admission: RE | Admit: 2010-11-14 | Discharge: 2010-11-14 | Disposition: A | Discharge status: Home / Self Care | Payer: Medicare Other | Source: Ambulatory Visit | Attending: Family Medicine | Admitting: Family Medicine

## 2010-11-14 DIAGNOSIS — IMO0002 Reserved for concepts with insufficient information to code with codable children: Secondary | ICD-10-CM

## 2010-11-14 DIAGNOSIS — N641 Fat necrosis of breast: Secondary | ICD-10-CM | POA: Insufficient documentation

## 2010-11-14 DIAGNOSIS — Z9889 Other specified postprocedural states: Secondary | ICD-10-CM | POA: Insufficient documentation

## 2010-11-15 ENCOUNTER — Ambulatory Visit (HOSPITAL_COMMUNITY)
Admission: RE | Admit: 2010-11-15 | Discharge: 2010-11-15 | Disposition: A | Discharge status: Home / Self Care | Payer: Medicare Other | Source: Ambulatory Visit | Attending: Family Medicine | Admitting: Family Medicine

## 2010-11-16 ENCOUNTER — Telehealth: Payer: Self-pay | Admitting: *Deleted

## 2010-11-16 NOTE — Telephone Encounter (Signed)
Left phone message for [name], clinical assistant for Dr.Love on 11/16/2010, at 2:55 pm. [phone]. [name] is okay to proceed with MRI concerning gait disturbance.  No contraindications with regards to her coronary stents.  Jory Sims NP

## 2010-11-16 NOTE — Telephone Encounter (Signed)
Referral to Dr. Erling Cruz by [name] for tremor and peripheral neuropathy Dr. Erling Cruz called yesterday and wanted to speak to you or [name] re: [name]'s stents He wishes to do a MRI for a gait disorder and [name] stated that she is not supposed to have a MRI because of her stents and he wants a return call from a provider on his cell.

## 2010-11-20 ENCOUNTER — Ambulatory Visit (HOSPITAL_COMMUNITY): Payer: Medicare Other | Admitting: *Deleted

## 2010-11-22 ENCOUNTER — Ambulatory Visit (HOSPITAL_COMMUNITY)
Admission: RE | Admit: 2010-11-22 | Discharge: 2010-11-22 | Disposition: A | Discharge status: Home / Self Care | Payer: Medicare Other | Source: Ambulatory Visit | Attending: Family Medicine | Admitting: Family Medicine

## 2010-11-26 ENCOUNTER — Other Ambulatory Visit: Payer: Self-pay | Admitting: Cardiology

## 2010-11-27 ENCOUNTER — Ambulatory Visit (HOSPITAL_COMMUNITY): Payer: Medicare Other | Admitting: Physical Therapy

## 2010-11-29 ENCOUNTER — Ambulatory Visit (HOSPITAL_COMMUNITY): Payer: Medicare Other | Admitting: Physical Therapy

## 2010-11-30 ENCOUNTER — Encounter: Payer: Self-pay | Admitting: Cardiology

## 2010-12-04 ENCOUNTER — Ambulatory Visit (HOSPITAL_COMMUNITY): Payer: Medicare Other | Admitting: *Deleted

## 2010-12-06 ENCOUNTER — Ambulatory Visit (HOSPITAL_COMMUNITY): Payer: Medicare Other | Admitting: *Deleted

## 2011-01-15 ENCOUNTER — Other Ambulatory Visit: Payer: Self-pay | Admitting: Cardiology

## 2011-01-18 ENCOUNTER — Other Ambulatory Visit: Payer: Self-pay | Admitting: Cardiology

## 2011-01-21 ENCOUNTER — Other Ambulatory Visit: Payer: Self-pay | Admitting: Cardiology

## 2011-01-28 ENCOUNTER — Encounter (INDEPENDENT_AMBULATORY_CARE_PROVIDER_SITE_OTHER): Payer: Self-pay | Admitting: *Deleted

## 2011-03-04 ENCOUNTER — Emergency Department (HOSPITAL_COMMUNITY): Payer: Medicare Other

## 2011-03-04 ENCOUNTER — Inpatient Hospital Stay (HOSPITAL_COMMUNITY)
Admission: EM | Admit: 2011-03-04 | Discharge: 2011-03-07 | DRG: 313 | Disposition: A | Discharge status: Home / Self Care | Payer: Medicare Other | Attending: Internal Medicine | Admitting: Internal Medicine

## 2011-03-04 ENCOUNTER — Other Ambulatory Visit: Payer: Self-pay

## 2011-03-04 ENCOUNTER — Encounter (HOSPITAL_COMMUNITY): Payer: Self-pay | Admitting: *Deleted

## 2011-03-04 DIAGNOSIS — E119 Type 2 diabetes mellitus without complications: Secondary | ICD-10-CM | POA: Diagnosis present

## 2011-03-04 DIAGNOSIS — I2510 Atherosclerotic heart disease of native coronary artery without angina pectoris: Secondary | ICD-10-CM | POA: Diagnosis present

## 2011-03-04 DIAGNOSIS — I2699 Other pulmonary embolism without acute cor pulmonale: Secondary | ICD-10-CM | POA: Diagnosis present

## 2011-03-04 DIAGNOSIS — R079 Chest pain, unspecified: Secondary | ICD-10-CM

## 2011-03-04 DIAGNOSIS — J45909 Unspecified asthma, uncomplicated: Secondary | ICD-10-CM

## 2011-03-04 DIAGNOSIS — K219 Gastro-esophageal reflux disease without esophagitis: Secondary | ICD-10-CM | POA: Diagnosis present

## 2011-03-04 DIAGNOSIS — R0602 Shortness of breath: Secondary | ICD-10-CM

## 2011-03-04 DIAGNOSIS — Z87891 Personal history of nicotine dependence: Secondary | ICD-10-CM

## 2011-03-04 DIAGNOSIS — G629 Polyneuropathy, unspecified: Secondary | ICD-10-CM

## 2011-03-04 DIAGNOSIS — D126 Benign neoplasm of colon, unspecified: Secondary | ICD-10-CM

## 2011-03-04 DIAGNOSIS — M353 Polymyalgia rheumatica: Secondary | ICD-10-CM | POA: Diagnosis present

## 2011-03-04 DIAGNOSIS — I10 Essential (primary) hypertension: Secondary | ICD-10-CM | POA: Diagnosis present

## 2011-03-04 DIAGNOSIS — I482 Chronic atrial fibrillation, unspecified: Secondary | ICD-10-CM | POA: Diagnosis present

## 2011-03-04 DIAGNOSIS — I4891 Unspecified atrial fibrillation: Secondary | ICD-10-CM

## 2011-03-04 DIAGNOSIS — R0789 Other chest pain: Principal | ICD-10-CM | POA: Diagnosis present

## 2011-03-04 DIAGNOSIS — Z6841 Body Mass Index (BMI) 40.0 and over, adult: Secondary | ICD-10-CM

## 2011-03-04 DIAGNOSIS — E039 Hypothyroidism, unspecified: Secondary | ICD-10-CM | POA: Diagnosis present

## 2011-03-04 DIAGNOSIS — Z86718 Personal history of other venous thrombosis and embolism: Secondary | ICD-10-CM

## 2011-03-04 DIAGNOSIS — Z7901 Long term (current) use of anticoagulants: Secondary | ICD-10-CM

## 2011-03-04 DIAGNOSIS — R0600 Dyspnea, unspecified: Secondary | ICD-10-CM

## 2011-03-04 DIAGNOSIS — R0609 Other forms of dyspnea: Secondary | ICD-10-CM | POA: Diagnosis present

## 2011-03-04 DIAGNOSIS — E785 Hyperlipidemia, unspecified: Secondary | ICD-10-CM | POA: Diagnosis present

## 2011-03-04 DIAGNOSIS — D649 Anemia, unspecified: Secondary | ICD-10-CM

## 2011-03-04 LAB — BASIC METABOLIC PANEL
BUN: 18 mg/dL (ref 6–23)
CO2: 27 mEq/L (ref 19–32)
Calcium: 9.9 mg/dL (ref 8.4–10.5)
Chloride: 101 mEq/L (ref 96–112)
Creatinine, Ser: 0.86 mg/dL (ref 0.50–1.10)

## 2011-03-04 LAB — CBC
HCT: 35.1 % — ABNORMAL LOW (ref 36.0–46.0)
MCH: 24.6 pg — ABNORMAL LOW (ref 26.0–34.0)
MCV: 80 fL (ref 78.0–100.0)
Platelets: 326 10*3/uL (ref 150–400)
RBC: 4.39 MIL/uL (ref 3.87–5.11)
WBC: 13.7 10*3/uL — ABNORMAL HIGH (ref 4.0–10.5)

## 2011-03-04 LAB — POCT I-STAT TROPONIN I: Troponin i, poc: 0 ng/mL (ref 0.00–0.08)

## 2011-03-04 LAB — D-DIMER, QUANTITATIVE: D-Dimer, Quant: 0.22 ug/mL-FEU (ref 0.00–0.48)

## 2011-03-04 MED ORDER — IPRATROPIUM BROMIDE 0.02 % IN SOLN
0.5000 mg | Freq: Once | RESPIRATORY_TRACT | Status: AC
  Administered 2011-03-04: 0.5 mg via RESPIRATORY_TRACT
  Filled 2011-03-04: qty 2.5

## 2011-03-04 MED ORDER — ALBUTEROL SULFATE (5 MG/ML) 0.5% IN NEBU
5.0000 mg | INHALATION_SOLUTION | Freq: Once | RESPIRATORY_TRACT | Status: AC
  Administered 2011-03-04: 5 mg via RESPIRATORY_TRACT
  Filled 2011-03-04: qty 1

## 2011-03-04 MED ORDER — NITROGLYCERIN 2 % TD OINT
1.0000 [in_us] | TOPICAL_OINTMENT | Freq: Once | TRANSDERMAL | Status: AC
  Administered 2011-03-04: 1 [in_us] via TOPICAL
  Filled 2011-03-04: qty 1

## 2011-03-04 NOTE — ED Provider Notes (Cosign Needed)
Scribed for Janice Norrie, MD, the patient was seen in room APA19/APA19 . This chart was scribed by Glory Buff. This patient's care was started at 6:02 PM.   CSN: [id number] Arrival date & time: 03/04/2011  4:12 PM  Chief Complaint  Patient presents with  . Chest Pain   HPI [name] is a 70 y.o. female who presents to the Emergency Department complaining of chest pain. Pt reports a tightness with heaviness in her central chest starting 3 days ago. Chest pain does not radiate to neck or shoulders. Pt complains on associated SOB,DOE, wheezing, coughing and generalized weakness. Pt reports she slept all day and night Sunday starting 3 days ago. Denies black or bloody stool, vomiting, diaphoresis fever, runny nose, sore throat. Pt also c/o diarrhea (chronic from IBS) and pedal edema which is also chronic and she takes fluid pills for that. She also has some nausea.  Both are sx she experiences at baseline. Pt is taking fluid pills for leg swelling with improvement. Pt also states "I feel bad all the time"  Pt reports a history of similar sx for chest pain.     Has history of 2 blockages. MI, Multiple lung clots 2009 Multiple stents (3) put in 2004, 2005, 2009 Takes Coumadin 5 mg a day INR checked last week. Reports 2.1 INR. Cardiologist Dr. Rothbart. PCP Dr. Luking. Past Medical History  Diagnosis Date  . Paroxysmal atrial fibrillation     normal LV function; episodes occurred in 205 and 09/2007  . ASCVD (arteriosclerotic cardiovascular disease)     stent to mid and proximal left anterior descending in 06/2002;drug eluting stent placed in the second diagnol in 08/2003 after  A  non-st elevation myocardial infarction   . Tobacco user     stopped   . Hyperlipidemia     pulmonary embolism 2000 and 09/2008  . Hypertension   . Pulmonary embolism     20 00/09/2008  . Thyroid disease     hypothyroidism  . Allergy history unknown   . GERD (gastroesophageal reflux disease)   . Diabetes mellitus      no insulin  . Asthma   . FH: colonic polyps     adenomataous  . Anticoagulated   . Diarrhea     acute  . Rectal bleeding   . Pedal edema   mild anemia. No hist of transfusion.  Polymyalgia rheumatica Irritable bowel syndrome   Past Surgical History  Procedure Date  . Cleft palate repair     4 surgeries  . Abdominal hysterectomy   . Appendectomy   . Cholecystectomy   . Heel spur surgery   . Knee surgery   . Breast reduction surgery   . Colonoscopy 2011    negative  Cardia stent x 3   MEDICATIONS:  Previous Medications   ALBUTEROL (PROAIR HFA) 108 (90 BASE) MCG/ACT INHALER    Inhale 2 puffs into the lungs every 4 (four) hours as needed. For shortness of breath    CHOLECALCIFEROL (VITAMIN D) 400 UNITS CAPSULE    Take 400 Units by mouth once a week.     CHOLECALCIFEROL (VITAMIN D3) 2000 UNITS TABS    Take 1 tablet by mouth daily.     CITALOPRAM (CELEXA) 20 MG TABLET    Take 40 mg by mouth at bedtime.     CITALOPRAM (CELEXA) 40 MG TABLET    Take 40 mg by mouth daily.     DICYCLOMINE (BENTYL) 10 MG CAPSULE  Take 10 mg by mouth 2 (two) times daily. Take one capsules 30 minutes prior to breakfast, then take two capsules 30 minutes prior to lunch.   FLUTICASONE (FLOVENT HFA) 44 MCG/ACT INHALER    Inhale 1 puff into the lungs at bedtime.    FUROSEMIDE (LASIX) 80 MG TABLET    Take 80 mg by mouth daily.    LEVOTHYROXINE SODIUM 50 MCG CAPS    Take 1 capsule by mouth at bedtime.    METFORMIN (GLUCOPHAGE) 500 MG TABLET    Take 500 mg by mouth 2 (two) times daily with a meal.     MONTELUKAST (SINGULAIR) 10 MG TABLET    Take 10 mg by mouth at bedtime.     OMEPRAZOLE (PRILOSEC) 20 MG CAPSULE    Take 20 mg by mouth 2 (two) times daily.     POTASSIUM CHLORIDE SA (K-DUR,KLOR-CON) 20 MEQ TABLET    Take 20 mEq by mouth daily.    PRAVASTATIN (PRAVACHOL) 40 MG TABLET    Take 40 mg by mouth at bedtime.    PREDNISONE (DELTASONE) 20 MG TABLET    Take 20 mg by mouth every morning. 40 mg daily     WARFARIN (COUMADIN) 5 MG TABLET    Take 5 mg by mouth every evening.       ALLERGIES:  Allergies as of 03/04/2011 - Review Complete 03/04/2011  Allergen Reaction Noted  . Demerol Nausea And Vomiting 03/04/2011     No family history on file.  History  Substance Use Topics  . Smoking status: Former Research scientist (life sciences)  . Smokeless tobacco: Never Used  . Alcohol Use: No  Quit smoking 35 years ago. Lives alone.   Review of Systems  Constitutional: Negative for fever and diaphoresis.  HENT: Negative for sore throat and rhinorrhea.   Respiratory: Positive for cough, chest tightness, shortness of breath and wheezing.   Cardiovascular: Positive for chest pain and leg swelling.  Gastrointestinal: Positive for diarrhea. Negative for nausea, vomiting and blood in stool.  Neurological: Positive for weakness.  All other systems reviewed and are negative.    Physical Exam  BP 147/107  Pulse 80  Temp(Src) 98.2 F (36.8 C) (Oral)  Resp 13  Ht 5\' 3"  (1.6 m)  Wt 260 lb (117.935 kg)  BMI 46.06 kg/m2  SpO2 97%  Physical Exam  Vitals reviewed. Constitutional: She is oriented to person, place, and time. She appears well-developed and well-nourished.  HENT:  Head: Normocephalic and atraumatic.       Pt has a lisp when she talks  Eyes: Conjunctivae and EOM are normal. Pupils are equal, round, and reactive to light.  Neck: Normal range of motion. Neck supple.  Cardiovascular: Normal rate, regular rhythm and normal heart sounds.   Pulmonary/Chest: Effort normal.       Pt has some decreased air movement and has audible wheeze on expir without wheezing in her chest  Abdominal: Soft. Bowel sounds are normal.  Musculoskeletal: Normal range of motion. She exhibits no edema and no tenderness.  Neurological: She is alert and oriented to person, place, and time. She has normal reflexes.  Skin: Skin is warm and dry.  Psychiatric: She has a normal mood and affect. Her behavior is normal. Thought content  normal.   Procedures  OTHER DATA REVIEWED: Nursing notes, vital signs, and past medical records reviewed.   DIAGNOSTIC STUDIES: Oxygen Saturation is 97% on room air, nomal by my interpretation.     Date: 03/04/2011  Rate: 77  Rhythm:  normal sinus rhythm and sinus arrhythmia  QRS Axis: normal  Intervals: normal  ST/T Wave abnormalities: normal  Conduction Disutrbances:none  Narrative Interpretation:   Old EKG Reviewed: none available   LABS / RADIOLOGY: Results for orders placed during the hospital encounter of 03/04/11  CBC      Component Value Range   WBC 13.7 (*) 4.0 - 10.5 (K/uL)   RBC 4.39  3.87 - 5.11 (MIL/uL)   Hemoglobin 10.8 (*) 12.0 - 15.0 (g/dL)   HCT 35.1 (*) 36.0 - 46.0 (%)   MCV 80.0  78.0 - 100.0 (fL)   MCH 24.6 (*) 26.0 - 34.0 (pg)   MCHC 30.8  30.0 - 36.0 (g/dL)   RDW 17.7 (*) 11.5 - 15.5 (%)   Platelets 326  150 - 400 (K/uL)  BASIC METABOLIC PANEL      Component Value Range   Sodium 142  135 - 145 (mEq/L)   Potassium 3.8  3.5 - 5.1 (mEq/L)   Chloride 101  96 - 112 (mEq/L)   CO2 27  19 - 32 (mEq/L)   Glucose, Bld 179 (*) 70 - 99 (mg/dL)   BUN 18  6 - 23 (mg/dL)   Creatinine, Ser 0.86  0.50 - 1.10 (mg/dL)   Calcium 9.9  8.4 - 10.5 (mg/dL)   GFR calc non Af Amer >60  >60 (mL/min)   GFR calc Af Amer >60  >60 (mL/min)  PROTIME-INR      Component Value Range   Prothrombin Time 28.9 (*) 11.6 - 15.2 (seconds)   INR 2.67 (*) 0.00 - 1.49   APTT      Component Value Range   aPTT 46 (*) 24 - 37 (seconds)  D-DIMER, QUANTITATIVE      Component Value Range   D-Dimer, Quant 0.22  0.00 - 0.48 (ug/mL-FEU)  POCT I-STAT TROPONIN I      Component Value Range   Troponin i, poc 0.00  0.00 - 0.08 (ng/mL)   Comment 3            Leukocytosis, mild anemia, therapeutic INR on coumadin otherwise normal   Dg Chest Portable 1 View  03/04/2011  *RADIOLOGY REPORT*  Clinical Data: Chest pain  PORTABLE CHEST - 1 VIEW  Comparison: Portable exam 1635 hours compared to  10/18/2010  Findings: Enlargement of cardiac silhouette. Mediastinal contours and pulmonary vascularity normal. Atherosclerotic calcification aortic arch. Lordotic positioning. No gross infiltrate or effusion. No pneumothorax.  IMPRESSION: Enlargement of cardiac silhouette. No acute abnormalities.  Original Report Authenticated By: Burnetta Sabin, M.D.   ED COURSE / [place]: 18:09 EDP at PT bedside. Reviewed past hist and performed PE. Discussed labs and radiology orders. Ordered breathing treatment.  23:32- EDP recheck. Chest pain Improved after breathing treatment and Nitroglycerin. Pt reports no current pain. EDP re-examined PT.  Discussed admission. 12:25  Dr Megan Salon will admit to observation/telemetry MDM:  PT has hx of CAD and 3 stents, her pain is better but needs more cardiology evalution. Risks of PE is low with normal D dimer and therapeutic INR.  MEDICATIONS GIVEN IN THE E.D.  Medications  nitroGLYCERIN (NITROGLYN) 2 % ointment 1 inch (not administered)  albuterol (PROVENTIL) (5 MG/ML) 0.5% nebulizer solution 5 mg (not administered)  ipratropium (ATROVENT) 0.02 % nebulizer solution 0.5 mg (not administered)    I personally performed the services described in this documentation, which was scribed in my presence. The recorded information has been reviewed and considered. Rolland Porter, MD, Abram Sander  Janice Norrie, MD 03/05/11 [phone]

## 2011-03-04 NOTE — ED Notes (Signed)
Increased chest pain x 2 days with increased sob and weakness.  Nausea yesterday, none today.

## 2011-03-04 NOTE — ED Notes (Signed)
Pt medicated. RT notified of breathing treatment order by Ellouise Newer. I-Stat Troponin being ran by [employer]. NAD at this time. Comfort measures provided to pt.

## 2011-03-05 ENCOUNTER — Encounter (HOSPITAL_COMMUNITY): Payer: Self-pay | Admitting: *Deleted

## 2011-03-05 DIAGNOSIS — R079 Chest pain, unspecified: Secondary | ICD-10-CM

## 2011-03-05 DIAGNOSIS — R0600 Dyspnea, unspecified: Secondary | ICD-10-CM | POA: Diagnosis present

## 2011-03-05 DIAGNOSIS — R0789 Other chest pain: Secondary | ICD-10-CM | POA: Diagnosis present

## 2011-03-05 DIAGNOSIS — I517 Cardiomegaly: Secondary | ICD-10-CM

## 2011-03-05 DIAGNOSIS — I2510 Atherosclerotic heart disease of native coronary artery without angina pectoris: Secondary | ICD-10-CM

## 2011-03-05 DIAGNOSIS — M353 Polymyalgia rheumatica: Secondary | ICD-10-CM | POA: Diagnosis present

## 2011-03-05 DIAGNOSIS — D649 Anemia, unspecified: Secondary | ICD-10-CM

## 2011-03-05 LAB — IRON AND TIBC
Saturation Ratios: 8 % — ABNORMAL LOW (ref 20–55)
UIBC: 289 ug/dL (ref 125–400)

## 2011-03-05 LAB — CARDIAC PANEL(CRET KIN+CKTOT+MB+TROPI)
CK, MB: 2.4 ng/mL (ref 0.3–4.0)
Relative Index: INVALID (ref 0.0–2.5)
Total CK: 53 U/L (ref 7–177)
Total CK: 65 U/L (ref 7–177)
Troponin I: <0.3 ng/mL (ref <0.30)

## 2011-03-05 LAB — BASIC METABOLIC PANEL
Chloride: 102 mEq/L (ref 96–112)
Creatinine, Ser: 0.8 mg/dL (ref 0.50–1.10)
GFR calc Af Amer: >60 mL/min (ref >60)
GFR calc non Af Amer: >60 mL/min (ref >60)

## 2011-03-05 LAB — RETICULOCYTES
RBC.: 4.04 MIL/uL (ref 3.87–5.11)
Retic Count, Absolute: 72.7 10*3/uL (ref 19.0–186.0)
Retic Ct Pct: 1.8 % (ref 0.4–3.1)

## 2011-03-05 LAB — GLUCOSE, CAPILLARY
Glucose-Capillary: 118 mg/dL — ABNORMAL HIGH (ref 70–99)
Glucose-Capillary: 88 mg/dL (ref 70–99)

## 2011-03-05 LAB — CBC
HCT: 32.6 % — ABNORMAL LOW (ref 36.0–46.0)
MCHC: 30.7 g/dL (ref 30.0–36.0)
Platelets: 318 10*3/uL (ref 150–400)
RDW: 17.8 % — ABNORMAL HIGH (ref 11.5–15.5)
WBC: 12.6 10*3/uL — ABNORMAL HIGH (ref 4.0–10.5)

## 2011-03-05 LAB — T4, FREE: Free T4: 1.23 ng/dL (ref 0.80–1.80)

## 2011-03-05 LAB — TSH: TSH: 0.926 u[IU]/mL (ref 0.350–4.500)

## 2011-03-05 LAB — FERRITIN: Ferritin: 15 ng/mL (ref 10–291)

## 2011-03-05 LAB — FOLATE: Folate: 12.1 ng/mL

## 2011-03-05 LAB — MRSA PCR SCREENING: MRSA by PCR: NEGATIVE

## 2011-03-05 LAB — HEMOGLOBIN A1C
Hgb A1c MFr Bld: 7 % — ABNORMAL HIGH (ref <5.7)
Mean Plasma Glucose: 154 mg/dL — ABNORMAL HIGH (ref <117)

## 2011-03-05 MED ORDER — PANTOPRAZOLE SODIUM 40 MG PO TBEC
40.0000 mg | DELAYED_RELEASE_TABLET | Freq: Two times a day (BID) | ORAL | Status: DC
  Administered 2011-03-05 – 2011-03-07 (×7): 40 mg via ORAL
  Filled 2011-03-05 (×7): qty 1

## 2011-03-05 MED ORDER — CITALOPRAM HYDROBROMIDE 20 MG PO TABS
40.0000 mg | ORAL_TABLET | Freq: Every day | ORAL | Status: DC
  Administered 2011-03-05 – 2011-03-06 (×2): 40 mg via ORAL
  Filled 2011-03-05: qty 2
  Filled 2011-03-05: qty 1

## 2011-03-05 MED ORDER — ALBUTEROL SULFATE HFA 108 (90 BASE) MCG/ACT IN AERS
2.0000 | INHALATION_SPRAY | RESPIRATORY_TRACT | Status: DC | PRN
  Filled 2011-03-05: qty 6.7

## 2011-03-05 MED ORDER — INSULIN ASPART 100 UNIT/ML ~~LOC~~ SOLN
0.0000 [IU] | Freq: Every day | SUBCUTANEOUS | Status: DC
  Administered 2011-03-05: 0 [IU] via SUBCUTANEOUS
  Filled 2011-03-05: qty 3

## 2011-03-05 MED ORDER — FUROSEMIDE 80 MG PO TABS
80.0000 mg | ORAL_TABLET | Freq: Every day | ORAL | Status: DC
  Administered 2011-03-05 – 2011-03-07 (×3): 80 mg via ORAL
  Filled 2011-03-05 (×2): qty 1

## 2011-03-05 MED ORDER — SIMVASTATIN 20 MG PO TABS: 20.0000 mg | ORAL_TABLET | Freq: Every day | ORAL | Status: DC

## 2011-03-05 MED ORDER — DICYCLOMINE HCL 10 MG PO CAPS
10.0000 mg | ORAL_CAPSULE | Freq: Two times a day (BID) | ORAL | Status: DC
  Administered 2011-03-05 – 2011-03-07 (×5): 10 mg via ORAL
  Filled 2011-03-05 (×12): qty 1

## 2011-03-05 MED ORDER — POTASSIUM CHLORIDE CRYS ER 20 MEQ PO TBCR
20.0000 meq | EXTENDED_RELEASE_TABLET | Freq: Every day | ORAL | Status: DC
  Administered 2011-03-05 – 2011-03-07 (×3): 20 meq via ORAL
  Filled 2011-03-05 (×3): qty 1

## 2011-03-05 MED ORDER — MONTELUKAST SODIUM 10 MG PO TABS
10.0000 mg | ORAL_TABLET | Freq: Every day | ORAL | Status: DC
  Administered 2011-03-05 – 2011-03-06 (×2): 10 mg via ORAL
  Filled 2011-03-05 (×2): qty 1

## 2011-03-05 MED ORDER — LEVOTHYROXINE SODIUM 50 MCG PO TABS
50.0000 ug | ORAL_TABLET | Freq: Every day | ORAL | Status: DC
  Administered 2011-03-05 – 2011-03-06 (×2): 50 ug via ORAL
  Filled 2011-03-05: qty 1
  Filled 2011-03-05: qty 2
  Filled 2011-03-05 (×3): qty 1

## 2011-03-05 MED ORDER — ONDANSETRON HCL 4 MG PO TABS: 4.0000 mg | ORAL_TABLET | Freq: Four times a day (QID) | ORAL | Status: DC | PRN

## 2011-03-05 MED ORDER — NITROGLYCERIN 0.4 MG SL SUBL: 0.4000 mg | SUBLINGUAL_TABLET | SUBLINGUAL | Status: DC | PRN

## 2011-03-05 MED ORDER — ACETAMINOPHEN 325 MG PO TABS
650.0000 mg | ORAL_TABLET | Freq: Four times a day (QID) | ORAL | Status: DC | PRN
  Administered 2011-03-05 – 2011-03-07 (×2): 650 mg via ORAL
  Filled 2011-03-05 (×2): qty 2

## 2011-03-05 MED ORDER — BISACODYL 10 MG RE SUPP: 10.0000 mg | RECTAL | Status: DC | PRN

## 2011-03-05 MED ORDER — OLMESARTAN MEDOXOMIL 20 MG PO TABS
40.0000 mg | ORAL_TABLET | Freq: Every day | ORAL | Status: DC
  Administered 2011-03-05 – 2011-03-07 (×3): 40 mg via ORAL
  Filled 2011-03-05 (×3): qty 2

## 2011-03-05 MED ORDER — FLUTICASONE PROPIONATE HFA 44 MCG/ACT IN AERO
1.0000 | INHALATION_SPRAY | Freq: Every day | RESPIRATORY_TRACT | Status: DC
  Administered 2011-03-05 – 2011-03-06 (×2): 1 via RESPIRATORY_TRACT
  Filled 2011-03-05: qty 10.6

## 2011-03-05 MED ORDER — PRAVASTATIN SODIUM 40 MG PO TABS
40.0000 mg | ORAL_TABLET | Freq: Every day | ORAL | Status: DC
  Administered 2011-03-06 – 2011-03-07 (×2): 40 mg via ORAL
  Filled 2011-03-05: qty 1
  Filled 2011-03-05: qty 2
  Filled 2011-03-05: qty 1

## 2011-03-05 MED ORDER — INSULIN ASPART 100 UNIT/ML ~~LOC~~ SOLN
0.0000 [IU] | Freq: Three times a day (TID) | SUBCUTANEOUS | Status: DC
Start: 2011-03-05 — End: 2011-03-07
  Administered 2011-03-05: 2 [IU] via SUBCUTANEOUS
  Administered 2011-03-07: 5 [IU] via SUBCUTANEOUS

## 2011-03-05 MED ORDER — SODIUM CHLORIDE 0.9 % IJ SOLN: 3.0000 mL | INTRAMUSCULAR | Status: DC | PRN

## 2011-03-05 MED ORDER — SODIUM CHLORIDE 0.9 % IV SOLN
INTRAVENOUS | Status: DC
  Administered 2011-03-05: 05:00:00 via INTRAVENOUS

## 2011-03-05 MED ORDER — ONDANSETRON HCL 4 MG/2ML IJ SOLN
4.0000 mg | Freq: Four times a day (QID) | INTRAMUSCULAR | Status: DC | PRN
  Administered 2011-03-05: 4 mg via INTRAVENOUS
  Filled 2011-03-05: qty 2

## 2011-03-05 MED ORDER — PREDNISONE 20 MG PO TABS
20.0000 mg | ORAL_TABLET | ORAL | Status: DC
  Administered 2011-03-05 – 2011-03-07 (×3): 20 mg via ORAL
  Filled 2011-03-05 (×3): qty 1

## 2011-03-05 MED ORDER — WARFARIN SODIUM 5 MG PO TABS
5.0000 mg | ORAL_TABLET | Freq: Every evening | ORAL | Status: DC
  Administered 2011-03-05 – 2011-03-07 (×3): 5 mg via ORAL
  Filled 2011-03-05 (×3): qty 1

## 2011-03-05 MED ORDER — ACETAMINOPHEN 650 MG RE SUPP: 650.0000 mg | Freq: Four times a day (QID) | RECTAL | Status: DC | PRN

## 2011-03-05 MED ORDER — METFORMIN HCL 500 MG PO TABS
500.0000 mg | ORAL_TABLET | Freq: Two times a day (BID) | ORAL | Status: DC
  Administered 2011-03-05 – 2011-03-07 (×4): 500 mg via ORAL
  Filled 2011-03-05 (×4): qty 1

## 2011-03-05 MED ORDER — DILTIAZEM HCL ER COATED BEADS 180 MG PO CP24
300.0000 mg | ORAL_CAPSULE | Freq: Every day | ORAL | Status: DC
  Administered 2011-03-05 – 2011-03-07 (×3): 300 mg via ORAL
  Filled 2011-03-05 (×3): qty 1

## 2011-03-05 MED ORDER — FLEET ENEMA 7-19 GM/118ML RE ENEM: 1.0000 | ENEMA | RECTAL | Status: DC | PRN

## 2011-03-05 MED ORDER — TRAZODONE HCL 50 MG PO TABS: 25.0000 mg | ORAL_TABLET | Freq: Every evening | ORAL | Status: DC | PRN

## 2011-03-05 MED ORDER — POLYETHYLENE GLYCOL 3350 17 G PO PACK: 17.0000 g | PACK | Freq: Every day | ORAL | Status: DC | PRN

## 2011-03-05 NOTE — ED Notes (Signed)
Up to BR with assistance, alert, no distress.  Report called to [facility]

## 2011-03-05 NOTE — ED Notes (Signed)
Sinus rhythm, alert, resting quietly.

## 2011-03-05 NOTE — Progress Notes (Signed)
UR Chart Review Completed  

## 2011-03-05 NOTE — H&P (Signed)
PCP:   Rubbie Battiest, MD, MD   Cardiologist: Jacqulyn Ducking, MD  Rheumatologist: Dr. Amil Amen in [facility]   Chief Complaint:  Chest pressure x 3 days  HPI: 70 year old morbidly obese Caucasian lady, history of multiple medical problems, including coronary artery disease status post stenting, pulmonary embolism and atrial fibrillation on chronic Coumadin therapy, chronic steroid therapy and for polymyalgia rheumatica, presents with a chest pressure and dyspnea on exertion for 3 days, she says is very similar to her previous experiences when she was found to have chronic obstruction. She denies any radiation of the pain denies dizziness or diaphoresis denies lower extremity edema. Next fever cough or cold nausea vomiting or diarrhea; he does have epigastric pains especially with swallowing pills.  Review of Systems:  The patient denies anorexia, fever, weight loss,, vision loss, decreased hearing, hoarseness, chest pain, syncope,, peripheral edema, balance deficits, hemoptysis, abdominal pain, melena, hematochezia, severe indigestion/heartburn, hematuria, incontinence, genital sores, muscle weakness, suspicious skin lesions, transient blindness, difficulty walking, depression, unusual weight change, abnormal bleeding, enlarged lymph nodes, angioedema, and breast masses.  Past Medical History: Past Medical History  Diagnosis Date  . Paroxysmal atrial fibrillation     normal LV function; episodes occurred in 205 and 09/2007  . ASCVD (arteriosclerotic cardiovascular disease)     stent to mid and proximal left anterior descending in 06/2002;drug eluting stent placed in the second diagnol in 08/2003 after  A  non-st elevation myocardial infarction   . Tobacco user     stopped   . Hyperlipidemia     pulmonary embolism 2000 and 09/2008  . Hypertension   . Pulmonary embolism     2000/09/2008  . Thyroid disease     hypothyroidism  . Allergy history unknown   . GERD (gastroesophageal reflux disease)   .  Diabetes mellitus     no insulin  . Asthma   . FH: colonic polyps     adenomataous  . Anticoagulated   . Diarrhea     acute  . Rectal bleeding   . Pedal edema    Past Surgical History  Procedure Date  . Cleft palate repair     4 surgeries  . Abdominal hysterectomy   . Appendectomy   . Cholecystectomy   . Heel spur surgery   . Knee surgery   . Breast reduction surgery   . Colonoscopy 2011    negative    Medications: Prior to Admission medications   Medication Sig Start Date End Date Taking? Authorizing Provider  albuterol (PROAIR HFA) 108 (90 BASE) MCG/ACT inhaler Inhale 2 puffs into the lungs every 4 (four) hours as needed. For shortness of breath    Yes Historical Provider, MD  Cholecalciferol (VITAMIN D3) 2000 UNITS TABS Take 1 tablet by mouth daily.     Yes Historical Provider, MD  citalopram (CELEXA) 20 MG tablet Take 40 mg by mouth at bedtime.     Yes Historical Provider, MD  dicyclomine (BENTYL) 10 MG capsule Take 10 mg by mouth 2 (two) times daily. Take one capsules 30 minutes prior to breakfast, then take two capsules 30 minutes prior to lunch.   Yes Historical Provider, MD  diltiazem (CARDIZEM CD) 300 MG 24 hr capsule   10/08/10  Yes Cristopher Estimable. Rothbart, MD  fluticasone (FLOVENT HFA) 44 MCG/ACT inhaler Inhale 1 puff into the lungs at bedtime.    Yes Historical Provider, MD  furosemide (LASIX) 80 MG tablet Take 80 mg by mouth daily.    Yes Historical Provider, MD  Levothyroxine Sodium 50 MCG CAPS Take 1 capsule by mouth at bedtime.    Yes Historical Provider, MD  metFORMIN (GLUCOPHAGE) 500 MG tablet Take 500 mg by mouth 2 (two) times daily with a meal.     Yes Historical Provider, MD  montelukast (SINGULAIR) 10 MG tablet Take 10 mg by mouth at bedtime.     Yes Historical Provider, MD  omeprazole (PRILOSEC) 20 MG capsule Take 20 mg by mouth 2 (two) times daily.     Yes Historical Provider, MD  potassium chloride SA (K-DUR,KLOR-CON) 20 MEQ tablet Take 20 mEq by mouth daily.     Yes Historical Provider, MD  pravastatin (PRAVACHOL) 40 MG tablet Take 40 mg by mouth at bedtime.    Yes Historical Provider, MD  predniSONE (DELTASONE) 20 MG tablet Take 20 mg by mouth every morning. 40 mg daily   Yes Historical Provider, MD  valsartan (DIOVAN) 320 MG tablet   11/26/10  Yes Cristopher Estimable. Rothbart, MD  warfarin (COUMADIN) 5 MG tablet Take 5 mg by mouth every evening.     Yes Historical Provider, MD  Cholecalciferol (VITAMIN D) 400 UNITS capsule Take 400 Units by mouth once a week.      Historical Provider, MD  citalopram (CELEXA) 40 MG tablet Take 40 mg by mouth daily.      Historical Provider, MD  warfarin (COUMADIN) 5 MG tablet   01/21/11   Cristopher Estimable. Lattie Haw, MD    Allergies:   Allergies  Allergen Reactions  . Demerol Nausea And Vomiting    Social History:  reports that she has quit smoking. She has never used smokeless tobacco. She reports that she does not drink alcohol or use illicit drugs.  Family History: No family history on file.  Physical Exam: Filed Vitals:   03/04/11 2215 03/04/11 2230 03/04/11 2339 03/05/11 0049  BP: 117/48  127/62 114/57  Pulse: 66 63 76 79  Temp:      TempSrc:      Resp: 15 16 20 22   Height:      Weight:      SpO2: 96% 96% 98% 96%   Morbidly obese, alert and oriented, comfortable, does not appear to be in any distress, eating a snack. Pupils round equal and reactive mucous membranes pink anicteric Neck no obvious thyromegaly no carotid bruit Chest his massive; no tenderness; clear to auscultation bilaterally CVS: Distant heart sounds regular rhythm no murmurs Abdomen: Massively obese soft nontender unable to appreciate masses Extremities: No edema Skin: No rash no ulcers Pulses: 2+ and symmetrical CNS: Cranial nerves 2-12 grossly intact no focal neurologic deficit    Labs on Admission:   [facility] 03/04/11 1627  NA 142  K 3.8  CL 101  CO2 27  GLUCOSE 179*  BUN 18  CREATININE 0.86  CALCIUM 9.9  MG --  PHOS --     Basename 03/04/11 1627  WBC 13.7*  NEUTROABS --  HGB 10.8*  HCT 35.1*  MCV 80.0  PLT 326   No results found for this basename: CKTOTAL:3,CKMB:3,CKMBINDEX:3,TROPONINI:3 in the last 72 hours No results found for this basename: TSH,T4TOTAL,FREET3,T3FREE,THYROIDAB in the last 72 hours No results found for this basename: VITAMINB12:2,FOLATE:2,FERRITIN:2,TIBC:2,IRON:2,RETICCTPCT:2 in the last 72 hours  Radiological Exams on Admission: Dg Chest Portable 1 View  03/04/2011  *RADIOLOGY REPORT*  Clinical Data: Chest pain  PORTABLE CHEST - 1 VIEW  Comparison: Portable exam 1635 hours compared to 10/18/2010  Findings: Enlargement of cardiac silhouette. Mediastinal contours and pulmonary vascularity normal. Atherosclerotic calcification  aortic arch. Lordotic positioning. No gross infiltrate or effusion. No pneumothorax.  IMPRESSION: Enlargement of cardiac silhouette. No acute abnormalities.  Original Report Authenticated By: Burnetta Sabin, M.D.    Assessment/Plan Present on Admission:  .PULMONARY EMBOLISM .FIBRILLATION, ATRIAL .CORONARY ARTERY DISEASE .HYPERTENSION .HYPERLIPIDEMIA .DM .HYPOTHYROIDISM .Chest pressure .DOE (dyspnea on exertion) .Polymyalgia rheumatica .GERD  Will bring this lady in on observation to rule out acute cardiac event, and will consider consulting cardiology depending on the outcomes over the next few hours. She does seem very close to her baseline at present.  Will continue her home meds.  Other plans as per orders  Amna Welker 03/05/2011, 2:48 AM

## 2011-03-05 NOTE — Progress Notes (Signed)
*  PRELIMINARY RESULTS* Echocardiogram 2D Echocardiogram has been performed.  [name] 03/05/2011, 1:49 PM

## 2011-03-05 NOTE — ED Notes (Signed)
Resting quietly , awaiting bed

## 2011-03-05 NOTE — Consult Note (Signed)
CARDIOLOGY CONSULT NOTE  Patient ID: [name] MRN: [id number] DOB/AGE: 72-Jan-1942 72 y.o.  Admit date: 03/04/2011 Referring Physician: Anastasio [name] [facility]) Primary Physician: [name] Primary Cardiologist: [name] Reason for Consultation: Chest Pain with known CAD  HPI: 70 y/o patient of Dr. Lattie [name] who has been lost to follow-up for 2 years.  She has a history of CAD with stents to the LAD (proximal and mid) in 2005 after NSTEMI, Pulmonary emboli, Hypertension, GERD s/p esophageal stricture dilatation, and Diabetes. She was in her usual state of health until 3 days ago when she began to have epigastric pain and pressure that was constant, worse with minimal exertion i.e.walking to the bathroom. She had some dyspnea with this (hard to take deep breaths).  She denies radiation or diaphoresis. She states this reminded her of her previous chest pain prior to stent placements and /or when she had PE.  She states she has also had generalized fatigue for about 3 months.   Review of systems complete and found to be negative unless listed above  Past Medical History  Diagnosis Date  . Paroxysmal atrial fibrillation     normal LV function; episodes occurred in 205 and 09/2007  . ASCVD (arteriosclerotic cardiovascular disease)     stent to mid and proximal left anterior descending in 06/2002;drug eluting stent placed in the second diagnol in 08/2003 after  A  non-st elevation myocardial infarction   . Tobacco user     stopped   . Hyperlipidemia     pulmonary embolism 2000 and 09/2008  . Hypertension   . Pulmonary embolism     2000/09/2008  . Thyroid disease     hypothyroidism  . Allergy history unknown   . GERD (gastroesophageal reflux disease)   . Diabetes mellitus     no insulin  . Asthma   . FH: colonic polyps     adenomataous  . Anticoagulated   . Diarrhea     acute  . Rectal bleeding   . Pedal edema     Family History  Problem Relation Age of Onset  . Heart attack Mother   . Heart  attack Father     History   Social History  . Marital Status: Widowed    Spouse Name: N/A    Number of Children: N/A  . Years of Education: N/A   Occupational History  . retired    Social History Main Topics  . Smoking status: Former Research scientist (life sciences)  . Smokeless tobacco: Never Used  . Alcohol Use: No  . Drug Use: No  . Sexually Active:    Other Topics Concern  . Not on file   Social History Narrative  . No narrative on file    Past Surgical History  Procedure Date  . Cleft palate repair     4 surgeries  . Abdominal hysterectomy   . Appendectomy   . Cholecystectomy   . Heel spur surgery   . Knee surgery   . Breast reduction surgery   . Colonoscopy 2011    negative     Prescriptions prior to admission  Medication Sig Dispense Refill  . albuterol (PROAIR HFA) 108 (90 BASE) MCG/ACT inhaler Inhale 2 puffs into the lungs every 4 (four) hours as needed. For shortness of breath       . Cholecalciferol (VITAMIN D3) 2000 UNITS TABS Take 1 tablet by mouth daily.        . citalopram (CELEXA) 20 MG tablet Take 40 mg by mouth at bedtime.        Marland Kitchen  dicyclomine (BENTYL) 10 MG capsule Take 10 mg by mouth 2 (two) times daily. Take one capsules 30 minutes prior to breakfast, then take two capsules 30 minutes prior to lunch.      . diltiazem (CARDIZEM CD) 300 MG 24 hr capsule        . fluticasone (FLOVENT HFA) 44 MCG/ACT inhaler Inhale 1 puff into the lungs at bedtime.       . furosemide (LASIX) 80 MG tablet Take 80 mg by mouth daily.       . Levothyroxine Sodium 50 MCG CAPS Take 1 capsule by mouth at bedtime.       . metFORMIN (GLUCOPHAGE) 500 MG tablet Take 500 mg by mouth 2 (two) times daily with a meal.        . montelukast (SINGULAIR) 10 MG tablet Take 10 mg by mouth at bedtime.        Marland Kitchen omeprazole (PRILOSEC) 20 MG capsule Take 20 mg by mouth 2 (two) times daily.        . potassium chloride SA (K-DUR,KLOR-CON) 20 MEQ tablet Take 20 mEq by mouth daily.       . pravastatin (PRAVACHOL) 40  MG tablet Take 40 mg by mouth at bedtime.       . predniSONE (DELTASONE) 20 MG tablet Take 20 mg by mouth every morning. 40 mg daily      . valsartan (DIOVAN) 320 MG tablet        . warfarin (COUMADIN) 5 MG tablet Take 5 mg by mouth every evening.        . Cholecalciferol (VITAMIN D) 400 UNITS capsule Take 400 Units by mouth once a week.        . citalopram (CELEXA) 40 MG tablet Take 40 mg by mouth daily.        Marland Kitchen warfarin (COUMADIN) 5 MG tablet          Physical Exam: Blood pressure 125/94, pulse 56, temperature 98.6 F (37 C), temperature source Oral, resp. rate 20, height 5\' 3"  (1.6 m), weight 264 lb 8.8 oz (120 kg), SpO2 95.00%.  General: Well developed, well nourished, in no acute distress Head: Eyes PERRLA, No xanthomas.   Normal cephalic and atramatic  Lungs: Clear bilaterally to auscultation and percussion. Heart: HRRR S1 S2, without MRG.  Pulses are 2+ & equal.            No carotid bruit.Neck obese, unable to see this.  No abdominal bruits. No femoral bruits. Abdomen: Bowel sounds are positive, abdomen soft and non-tender without masses or                  Hernia's noted.Obese Msk:  Back normal, normal gait. Normal strength and tone for age. Extremities: No clubbing, cyanosis or edema.  DP +1 Neuro: Alert and oriented X 3. Psych:  Good affect, responds appropriately Labs:   Lab Results  Component Value Date   WBC 12.6* 03/05/2011   HGB 10.0* 03/05/2011   HCT 32.6* 03/05/2011   MCV 80.7 03/05/2011   PLT 318 03/05/2011     Lab 03/05/11 0430  NA 143  K 3.8  CL 102  CO2 34*  BUN 22  CREATININE 0.80  CALCIUM 9.6  PROT --  BILITOT --  ALKPHOS --  ALT --  AST --  GLUCOSE 119*   Lab Results  Component Value Date   CKTOTAL 53 03/05/2011   CKMB 2.4 03/05/2011   TROPONINI <0.30 03/05/2011    Lab Results  Component  Value Date   CHOL 159 02/13/2010   CHOL 159 02/13/2010   CHOL 145 11/13/2009   Lab Results  Component Value Date   HDL 40 02/13/2010   HDL 4.0 02/13/2010     HDL 61 11/13/2009   Lab Results  Component Value Date   LDLCALC 88 02/13/2010   LDLCALC 88 02/13/2010   LDLCALC 62 11/13/2009   Lab Results  Component Value Date   TRIG 155* 02/13/2010   TRIG 40 02/13/2010   TRIG 112 11/13/2009   Lab Results  Component Value Date   CHOLHDL 4.0 Ratio 02/13/2010   CHOLHDL 2.5 09/15/2008   Radiology:CXR 03/04/11 IMPRESSION: Enlargement of cardiac silhouette. No acute abnormalities.  Cardiac Cath (06/2002)) CONCLUSION:  1. Preserved left ventricular function.  2. Tandem stenosis of the left anterior descending artery involving the     origins of the first and second diagonal takeoffs as well as the first     septal takeoff.  3. Mild irregularity of the proximal circumflex. Cardiac Cath Intervention:(06/2002)  PROCEDURE:  1. Percutaneous stenting of the proximal left anterior descending artery.  2. Percutaneous stenting of the mid left anterior descending artery.  ECHO: (10/2003)  FINDINGS:  1. A technically-difficult and somewhat suboptimal echocardiographic study.  2. Normal left and right atrial size; normal right ventricular size and     function; RVH present.  3. Normal mitral; tricuspid and pulmonic valves.  The aortic valve is not     well seen, but probably normal.  4. Normal proximal pulmonary artery.  5. Normal internal dimension of the left ventricle; borderline LVH; normal     regional and global LV systolic function.  6. Normal IVC.  7. Prominent epicardial fat.  EKG: NSR, borderline low voltage, delayed R wave progression, no acute abnormality.  No prior tracing for comparison.  ASSESSMENT AND PLAN:   1. Chest Pain: She has known history of CAD with stents to the LAD (Prox and Mid) in 2005. She has had recurrent pain and fatigue with associated shortness of breath for 3 days. Pain is typical and atypical, constant pressure and tightness which reminds her of pain with CAD and PE.  Cardiac enzymes are negative so far. D-dimer is  negative.  Review EKG, and continue serial enzymes. I will have echo completed for LV fx and consider 2 -day stress test (secondary to obesity) for re-evaluation of CAD and probable progression of this with continued risk factors.    2. Dyspnea: Consider Pickwickian syndrome secondary to morbid obesity. Is a sleep study warranted for evaluation of hypoventlation?   3. GERD: History of esophageal stricture with dilatation in the past by Dr. Elta Guadeloupe. May need to consider EGD if cardiac etiology is ruled out. She is continued on pantoprazole 40 mg BID AC.  4. Hypothyroidism: Will check TSH level.  5. Anemia: Iron studies consistent with iron deficiency.  Signed: Phill Myron. Lawrence NP [facility] 03/05/2011, 10:00 AM  Cardiology Attending  [name] presents with lower chest/epigastric pressure with associated dyspnea, weakness and malaise. She has anemia with initial studies that suggest iron deficiency as the etiology. Patient denies melena or hematochezia. Symptoms have improved without any specific therapy. We will proceed with a pharmacologic stress nuclear study, but GI investigations might be more productive.   D-dimer is pending, but I would not proceed with CT scanning unless there is more than a trivial elevation.   Jacqulyn Ducking, MD

## 2011-03-05 NOTE — Progress Notes (Signed)
ANTICOAGULATION CONSULT NOTE - Initial Consult  Pharmacy Consult for Warfarin Indication: Hx of PE--continuation of home dose.  Allergies  Allergen Reactions  . Demerol Nausea And Vomiting    Patient Measurements: Height: 5\' 3"  (160 cm) Weight: 264 lb 8.8 oz (120 kg) IBW/kg (Calculated) : 52.4  Adjusted Body Weight:  Vital Signs: Temp: 98.6 F (37 C) (09/11 0800) Temp src: Oral (09/11 0800) BP: 125/94 mmHg (09/11 0800) Pulse Rate: 56  (09/11 0427)  Labs:  Basename 03/05/11 0749 03/05/11 0445 03/05/11 0430 03/04/11 1627  HGB -- -- 10.0* 10.8*  HCT -- -- 32.6* 35.1*  PLT -- -- 318 326  APTT -- -- -- 46*  LABPROT 27.4* -- -- 28.9*  INR 2.50* -- -- 2.67*  HEPARINUNFRC -- -- -- --  CREATININE -- -- 0.80 0.86  CRCLEARANCE -- -- -- --  CKTOTAL -- 53 -- --  CKMB -- 2.4 -- --  TROPONINI -- <0.30 -- --    Medical History: Past Medical History  Diagnosis Date  . Paroxysmal atrial fibrillation     normal LV function; episodes occurred in 205 and 09/2007  . ASCVD (arteriosclerotic cardiovascular disease)     stent to mid and proximal left anterior descending in 06/2002;drug eluting stent placed in the second diagnol in 08/2003 after  A  non-st elevation myocardial infarction   . Tobacco user     stopped   . Hyperlipidemia     pulmonary embolism 2000 and 09/2008  . Hypertension   . Pulmonary embolism     2000/09/2008  . Thyroid disease     hypothyroidism  . Allergy history unknown   . GERD (gastroesophageal reflux disease)   . Diabetes mellitus     no insulin  . Asthma   . FH: colonic polyps     adenomataous  . Anticoagulated   . Diarrhea     acute  . Rectal bleeding   . Pedal edema     Medications:  Home dose is Warfarin 5mg  daily  Assessment:  INR therapeutic  Goal of Therapy:  INR 2-3   Plan:  Continue Warfarin 5mg  daily.   Briscoe Burns, [place] J 03/05/2011,9:27 AM

## 2011-03-05 NOTE — Progress Notes (Signed)
This lady was admitted with a 3 day history and of chest pressure and dyspnea. On one occasion at least it made her vomit. She says that this chest pressure and dyspnea feels similar to the times when she has had ischemic cardiac events and/or pulmonary embolism. However, an echocardiogram does not show any ST-T wave changes and d-dimer is in the normal range. She also describes difficulty with swallowing and admits that she has had to have esophageal dilation several times.  Plan: 1. Cardiology consultation. 2. Consider gastroenterology consultation with Dr. Laural Golden, who knows this patient.

## 2011-03-06 ENCOUNTER — Encounter (HOSPITAL_COMMUNITY): Payer: Self-pay | Admitting: Cardiology

## 2011-03-06 ENCOUNTER — Inpatient Hospital Stay (HOSPITAL_COMMUNITY): Payer: Medicare Other

## 2011-03-06 LAB — PROTIME-INR: INR: 2.15 — ABNORMAL HIGH (ref 0.00–1.49)

## 2011-03-06 LAB — GLUCOSE, CAPILLARY
Glucose-Capillary: 89 mg/dL (ref 70–99)
Glucose-Capillary: 108 mg/dL — ABNORMAL HIGH (ref 70–99)

## 2011-03-06 MED ORDER — REGADENOSON 0.4 MG/5ML IV SOLN
INTRAVENOUS | Status: AC
  Administered 2011-03-06: 0.4 mg via INTRAVENOUS
  Filled 2011-03-06: qty 5

## 2011-03-06 MED ORDER — TECHNETIUM TC 99M TETROFOSMIN IV KIT
30.0000 | PACK | Freq: Once | INTRAVENOUS | Status: AC | PRN
  Administered 2011-03-06: 30 via INTRAVENOUS

## 2011-03-06 MED ORDER — SODIUM CHLORIDE 0.9 % IJ SOLN
INTRAMUSCULAR | Status: AC
  Administered 2011-03-06: 10 mL via INTRAVENOUS
  Filled 2011-03-06: qty 10

## 2011-03-06 NOTE — Progress Notes (Signed)
Subjective: Patient has done well overnight with no chest discomfort nor dyspnea. She continues to note some malaise, but feels close to being back to baseline. We will proceed with a pharmacologic stress nuclear study today.  Objective: Vital signs in last 24 hours: Temp:  [97.9 F (36.6 C)-98.5 F (36.9 C)] 98.5 F (36.9 C) (09/12 1428) Pulse Rate:  [54-79] 69  (09/12 1428) Resp:  [11-25] 18  (09/12 1428) BP: (109-143)/(54-73) 114/68 mmHg (09/12 1428) SpO2:  [90 %-98 %] 93 % (09/12 1428) Weight:  [118 kg (260 lb 2.3 oz)-118.026 kg (260 lb 3.2 oz)] 260 lb 3.2 oz (118.026 kg) (09/12 1228) Weight change: 0.065 kg (2.3 oz) Last BM Date: 03/05/11  Current Facility-Administered Medications  Medication Dose Route Frequency Provider Last Rate Last Dose  . acetaminophen (TYLENOL) tablet 650 mg  650 mg Oral Q6H PRN Karlyn Agee   650 mg at 03/05/11 0759   Or  . acetaminophen (TYLENOL) suppository 650 mg  650 mg Rectal Q6H PRN Karlyn Agee      . albuterol (PROVENTIL HFA;VENTOLIN HFA) inhaler 2 puff  2 puff Inhalation Q4H PRN Karlyn Agee      . bisacodyl (DULCOLAX) suppository 10 mg  10 mg Rectal Q48H PRN Karlyn Agee      . citalopram (CELEXA) tablet 40 mg  40 mg Oral QHS Leopold Campbell   40 mg at 03/05/11 2145  . dicyclomine (BENTYL) capsule 10 mg  10 mg Oral BID Karlyn Agee   10 mg at 03/06/11 1314  . diltiazem (CARDIZEM CD) 24 hr capsule 300 mg  300 mg Oral Daily Leopold Campbell   300 mg at 03/06/11 1300  . fluticasone (FLOVENT HFA) 44 MCG/ACT inhaler 1 puff  1 puff Inhalation QHS Karlyn Agee   1 puff at 03/05/11 1950  . furosemide (LASIX) tablet 80 mg  80 mg Oral Daily Leopold Campbell   80 mg at 03/06/11 1000  . insulin aspart (novoLOG) injection 0-15 Units  0-15 Units Subcutaneous TID WC Karlyn Agee   2 Units at 03/05/11 1700  . insulin aspart (novoLOG) injection 0-5 Units  0-5 Units Subcutaneous QHS Karlyn Agee   0 Units at 03/05/11 2146  .  levothyroxine (SYNTHROID, LEVOTHROID) tablet 50 mcg  50 mcg Oral QHS Leopold Campbell   50 mcg at 03/05/11 2145  . metFORMIN (GLUCOPHAGE) tablet 500 mg  500 mg Oral BID WC Nimish C Gosrani   500 mg at 03/05/11 1934  . montelukast (SINGULAIR) tablet 10 mg  10 mg Oral QHS Karlyn Agee   10 mg at 03/05/11 2144  . nitroGLYCERIN (NITROSTAT) SL tablet 0.4 mg  0.4 mg Sublingual Q5 min PRN Karlyn Agee      . olmesartan (BENICAR) tablet 40 mg  40 mg Oral Daily Leopold Campbell   40 mg at 03/06/11 1300  . ondansetron (ZOFRAN) tablet 4 mg  4 mg Oral Q6H PRN Karlyn Agee       Or  . ondansetron [facility]) injection 4 mg  4 mg Intravenous Q6H PRN Karlyn Agee   4 mg at 03/05/11 1311  . pantoprazole (PROTONIX) EC tablet 40 mg  40 mg Oral BID AC Leopold Campbell   40 mg at 03/06/11 1717  . polyethylene glycol (MIRALAX / GLYCOLAX) packet 17 g  17 g Oral Daily PRN Karlyn Agee      . potassium chloride SA (K-DUR,KLOR-CON) CR tablet 20 mEq  20 mEq Oral Daily Karlyn Agee   20 mEq at 03/06/11 1301  . pravastatin (PRAVACHOL)  tablet 40 mg  40 mg Oral Daily Francisco J [place], PHARMD   40 mg at 03/06/11 1000  . predniSONE (DELTASONE) tablet 20 mg  20 mg Oral QAM Leopold Campbell   20 mg at 03/06/11 [id number]  . regadenoson (LEXISCAN) 0.4 MG/5ML injection SOLN        0.4 mg at 03/06/11 1104  . sodium chloride 0.9 % injection 3 mL  3 mL Intravenous PRN Karlyn Agee      . sodium chloride 0.9 % injection        10 mL at 03/06/11 1106  . sodium phosphate (FLEET) 7-19 GM/118ML enema 1 enema  1 enema Rectal Q48H PRN Karlyn Agee      . technetium tetrofosmin (TC-MYOVIEW) injection 30 milli Curie  30 milli Curie Intravenous Once PRN Medication Radiologist   [place] at 03/06/11 1100  . traZODone (DESYREL) tablet 25 mg  25 mg Oral QHS PRN Karlyn Agee      . warfarin (COUMADIN) tablet 5 mg  5 mg Oral QPM Karlyn Agee   5 mg at 03/06/11 1717  . DISCONTD: 0.9 %  sodium chloride infusion    Intravenous Continuous Janice Norrie, MD 50 mL/hr at 03/05/11 1800       General-Well developed; no acute distress Body habitus-obese Neck-No JVD, no carotid bruits Lungs: clear lung fields; normal I:E ratio; decreased breath sounds at the bases Cardiovascular-normal PMI; normal S1 and S2; modest systolic ejection murmur Abdomen-normal bowel sounds; soft and non-tender without masses or organomegaly Skin-Warm, no significant lesions Extremities-Nl distal pulses; no edema  Patient Active Problem List  Diagnoses  . COLONIC POLYPS, ADENOMATOUS  . HYPOTHYROIDISM  . DM  . HYPERLIPIDEMIA  . HYPERTENSION  . CORONARY ARTERY DISEASE  . PULMONARY EMBOLISM  . FIBRILLATION, ATRIAL  . ASTHMA  . GERD  . TOBACCO USE, QUIT  . Peripheral neuropathy  . Chest pressure  . DOE (dyspnea on exertion)  . Polymyalgia rheumatica  . Anemia     Assessment/Plan:  Hypertension-Blood pressure is well-controlled with current therapy, which will be continued. H/O pulmonary embolism-d-dimer is normal essentially excluding a recurrent thromboembolic event H/O atrial fibrillation-  No recurrence this admission Chest pain-Scheduled for today stress test; if the stress portion of the study has low risk findings, she can be discharged and the second day imaging omitted or performed as an outpatient.  LOS: 2 days   Jacqulyn Ducking 03/06/2011, 6:13 PM

## 2011-03-06 NOTE — Progress Notes (Signed)
Stress Lab Nurses Notes - [name]  [name] 03/06/2011  Reason for doing test: CAD and Chest Pain  Type of test: [name]  Nurse performing test: Gerrit Halls, RN  Nuclear Medicine Tech: Melburn Hake  Echo Tech: Not Applicable  MD performing test: R. Lattie Haw  Family MD: Mickie Hillier  Test explained and consent signed: yes  IV started: 20g jelco, Saline lock flushed, No redness or edema and Saline lock from floor  Symptoms: Nausea and headache  Treatment/Intervention: None  Reason test stopped: protocol completed  After recovery IV was: No redness or edema and Saline Lock flushed  Patient to return to [place]. Med at :11:45  Patient discharged: Transported back to room 340 via W/C  Patient's Condition upon discharge was: stable  Comments: BP during test 118/70 HR 106, during recovery BP 110/60 & HR 89.  Symptoms resolved in recovery.  Geanie Cooley T

## 2011-03-06 NOTE — Progress Notes (Signed)
Subjective: Still feels short of breath with movement. While she is lying in bed feels fine. Is going for nuclear stress test this morning.  Objective: Vital signs in last 24 hours: Temp:  [97.9 F (36.6 C)-98.5 F (36.9 C)] 98 F (36.7 C) (09/12 0800) Pulse Rate:  [54-73] 55  (09/12 0800) Resp:  [11-25] 16  (09/12 0838) BP: (109-143)/(54-73) 109/62 mmHg (09/12 0800) SpO2:  [90 %-98 %] 95 % (09/12 0800) Weight:  [118 kg (260 lb 2.3 oz)] 260 lb 2.3 oz (118 kg) (09/12 0400) Weight change: 0.065 kg (2.3 oz) Last BM Date: 03/05/11  Intake/Output from previous day: 09/11 0701 - 09/12 0700 In: 1142 [P.O.:490; I.V.:650; IV Piggyback:2] Out: 2800 [Urine:2800]     Physical Exam: General: Alert, awake, oriented x3, in no acute distress, obese. HEENT: No bruits, no goiter. Heart: Regular rate and rhythm, without murmurs, rubs, gallops. Lungs: Clear to auscultation bilaterally. Abdomen: Soft, nontender, nondistended, positive bowel sounds. Extremities: No clubbing cyanosis or edema with positive pedal pulses. Neuro: Grossly intact, nonfocal.    Lab Results:  Basename 03/05/11 0430 03/04/11 1627  WBC 12.6* 13.7*  HGB 10.0* 10.8*  HCT 32.6* 35.1*  PLT 318 326    Basename 03/05/11 0430 03/04/11 1627  NA 143 142  K 3.8 3.8  CL 102 101  CO2 34* 27  GLUCOSE 119* 179*  BUN 22 18  CREATININE 0.80 0.86  CALCIUM 9.6 9.9   Recent Results (from the past 240 hour(s))  MRSA PCR SCREENING     Status: Normal   Collection Time   03/05/11  6:24 PM      Component Value Range Status Comment   MRSA by PCR NEGATIVE  NEGATIVE  Final      Studies/Results: Dg Chest Portable 1 View  03/04/2011  *RADIOLOGY REPORT*  Clinical Data: Chest pain  PORTABLE CHEST - 1 VIEW  Comparison: Portable exam 1635 hours compared to 10/18/2010  Findings: Enlargement of cardiac silhouette. Mediastinal contours and pulmonary vascularity normal. Atherosclerotic calcification aortic arch. Lordotic positioning. No  gross infiltrate or effusion. No pneumothorax.  IMPRESSION: Enlargement of cardiac silhouette. No acute abnormalities.  Original Report Authenticated By: Burnetta Sabin, M.D.    Medications: Scheduled Meds:   . citalopram  40 mg Oral QHS  . dicyclomine  10 mg Oral BID  . diltiazem  300 mg Oral Daily  . fluticasone  1 puff Inhalation QHS  . furosemide  80 mg Oral Daily  . insulin aspart  0-15 Units Subcutaneous TID WC  . insulin aspart  0-5 Units Subcutaneous QHS  . levothyroxine  50 mcg Oral QHS  . metFORMIN  500 mg Oral BID WC  . montelukast  10 mg Oral QHS  . olmesartan  40 mg Oral Daily  . pantoprazole  40 mg Oral BID AC  . potassium chloride SA  20 mEq Oral Daily  . pravastatin  40 mg Oral Daily  . predniSONE  20 mg Oral QAM  . warfarin  5 mg Oral QPM   Continuous Infusions:   . DISCONTD: sodium chloride 50 mL/hr at 03/05/11 1800   PRN Meds:.acetaminophen, acetaminophen, albuterol, bisacodyl, nitroGLYCERIN, ondansetron (ZOFRAN) IV, ondansetron, polyethylene glycol, sodium chloride, sodium phosphate, traZODone  Assessment/Plan:  Principal Problem:  *Chest pressure Active Problems:  HYPOTHYROIDISM  DM  HYPERLIPIDEMIA  HYPERTENSION  CORONARY ARTERY DISEASE  PULMONARY EMBOLISM  FIBRILLATION, ATRIAL  GERD  DOE (dyspnea on exertion)  Polymyalgia rheumatica  Anemia  #1 chest pain: Is going for nuclear stress test  today given history of coronary artery disease and somewhat typical symptoms. 2-D echocardiogram shows normal ejection fraction with no wall motion abnormalities. D-dimer is surprisingly within normal limits. She is already chronically anticoagulated given history of pulmonary embolism and atrial fibrillation. INR is therapeutic.  #2 hypothyroidism: TSH is within normal limits. Continue current Synthroid dose.  #3 diabetes: Well controlled.  #4 hypertension: Well controlled.   LOS: 2 days   HERNANDEZ [name],[name] 03/06/2011, 9:39 AM

## 2011-03-06 NOTE — Progress Notes (Signed)
[place] for Warfarin Indication: Hx of PE--continuation of home dose.  Allergies  Allergen Reactions  . Demerol Nausea And Vomiting    Patient Measurements: Height: 5\' 3"  (160 cm) Weight: 260 lb 2.3 oz (118 kg) IBW/kg (Calculated) : 52.4    Vital Signs: Temp: 98 F (36.7 C) (09/12 0800) Temp src: Oral (09/12 0800) BP: 109/62 mmHg (09/12 0800) Pulse Rate: 55  (09/12 0800)  Labs:  Flo Shanks 03/06/11 0714 03/05/11 1557 03/05/11 0749 03/05/11 0445 03/05/11 0430 03/04/11 1627  HGB -- -- -- -- 10.0* 10.8*  HCT -- -- -- -- 32.6* 35.1*  PLT -- -- -- -- 318 326  APTT -- -- -- -- -- 46*  LABPROT 24.4* -- 27.4* -- -- 28.9*  INR 2.15* -- 2.50* -- -- 2.67*  HEPARINUNFRC -- -- -- -- -- --  CREATININE -- -- -- -- 0.80 0.86  CRCLEARANCE -- -- -- -- -- --  CKTOTAL -- 65 -- 53 -- --  CKMB -- 2.5 -- 2.4 -- --  TROPONINI -- <0.30 -- <0.30 -- --    Medical History: Past Medical History  Diagnosis Date  . Paroxysmal atrial fibrillation     normal LV function; episodes occurred in 205 and 09/2007  . ASCVD (arteriosclerotic cardiovascular disease)     stent to mid and proximal left anterior descending in 06/2002;drug eluting stent placed in the second diagnol in 08/2003 after  A  non-st elevation myocardial infarction   . Tobacco user     stopped   . Hyperlipidemia     pulmonary embolism 2000 and 09/2008  . Hypertension   . Pulmonary embolism     2000/09/2008  . Thyroid disease     hypothyroidism  . Allergy history unknown   . GERD (gastroesophageal reflux disease)   . Diabetes mellitus     no insulin  . Asthma   . FH: colonic polyps     adenomataous  . Anticoagulated   . Diarrhea     acute  . Rectal bleeding   . Pedal edema     Medications:  Home dose is Warfarin 5mg  daily  Assessment: INR therapeutic  Goal of Therapy:  INR 2-3   Plan:  Continue Warfarin 5mg  daily.  Pricilla Larsson 03/06/2011,8:26 AM

## 2011-03-06 NOTE — Progress Notes (Signed)
Pt to be transferred to room 340. Report called to RN.

## 2011-03-07 ENCOUNTER — Encounter (HOSPITAL_COMMUNITY): Payer: Medicare Other | Attending: Cardiology

## 2011-03-07 ENCOUNTER — Encounter (HOSPITAL_COMMUNITY): Payer: Medicare Other

## 2011-03-07 LAB — IRON AND TIBC
Saturation Ratios: 7 % — ABNORMAL LOW (ref 20–55)
TIBC: 319 ug/dL (ref 250–470)

## 2011-03-07 LAB — GLUCOSE, CAPILLARY

## 2011-03-07 LAB — BASIC METABOLIC PANEL
BUN: 20 mg/dL (ref 6–23)
CO2: 35 mEq/L — ABNORMAL HIGH (ref 19–32)
Calcium: 9.4 mg/dL (ref 8.4–10.5)
Creatinine, Ser: 0.86 mg/dL (ref 0.50–1.10)
GFR calc non Af Amer: >60 mL/min (ref >60)
Glucose, Bld: 104 mg/dL — ABNORMAL HIGH (ref 70–99)

## 2011-03-07 LAB — RETICULOCYTES: Retic Ct Pct: 2.1 % (ref 0.4–3.1)

## 2011-03-07 LAB — PROTIME-INR: Prothrombin Time: 23.3 seconds — ABNORMAL HIGH (ref 11.6–15.2)

## 2011-03-07 LAB — CBC
HCT: 34.7 % — ABNORMAL LOW (ref 36.0–46.0)
Hemoglobin: 10.6 g/dL — ABNORMAL LOW (ref 12.0–15.0)
MCH: 24.7 pg — ABNORMAL LOW (ref 26.0–34.0)
MCHC: 30.5 g/dL (ref 30.0–36.0)
MCV: 80.9 fL (ref 78.0–100.0)
RBC: 4.29 MIL/uL (ref 3.87–5.11)

## 2011-03-07 LAB — FERRITIN: Ferritin: 21 ng/mL (ref 10–291)

## 2011-03-07 MED ORDER — NITROGLYCERIN 0.4 MG SL SUBL: 0.4000 mg | SUBLINGUAL_TABLET | SUBLINGUAL | Status: DC | PRN

## 2011-03-07 MED ORDER — TECHNETIUM TC 99M TETROFOSMIN IV KIT
25.0000 | PACK | Freq: Once | INTRAVENOUS | Status: AC | PRN
  Administered 2011-03-07: 25 via INTRAVENOUS

## 2011-03-07 NOTE — Discharge Summary (Signed)
Physician Discharge Summary  Patient ID: [name] MRN: [id number] DOB/AGE: 1940-12-21 70 y.o.  Admit date: 03/04/2011 Discharge date: 03/07/2011  Primary Care Physician:  [name] Battiest, MD, MD   Discharge Diagnoses:     .PULMONARY EMBOLISM .FIBRILLATION, ATRIAL .CORONARY ARTERY DISEASE .HYPERTENSION .HYPERLIPIDEMIA .DM .HYPOTHYROIDISM .Chest pressure .DOE (dyspnea on exertion) .Polymyalgia rheumatica .GERD  Current Discharge Medication List    START taking these medications   Details  nitroGLYCERIN (NITROSTAT) 0.4 MG SL tablet Place 1 tablet (0.4 mg total) under the tongue every 5 (five) minutes as needed for chest pain. Qty: 90 tablet, Refills: 0      CONTINUE these medications which have NOT CHANGED   Details  albuterol (PROAIR HFA) 108 (90 BASE) MCG/ACT inhaler Inhale 2 puffs into the lungs every 4 (four) hours as needed. For shortness of breath     Cholecalciferol (VITAMIN D3) 2000 UNITS TABS Take 1 tablet by mouth daily.      citalopram (CELEXA) 20 MG tablet Take 40 mg by mouth at bedtime.      diltiazem (CARDIZEM CD) 300 MG 24 hr capsule     fluticasone (FLOVENT HFA) 44 MCG/ACT inhaler Inhale 1 puff into the lungs at bedtime.     furosemide (LASIX) 80 MG tablet Take 80 mg by mouth daily.     Levothyroxine Sodium 50 MCG CAPS Take 1 capsule by mouth at bedtime.     metFORMIN (GLUCOPHAGE) 500 MG tablet Take 500 mg by mouth 2 (two) times daily with a meal.      montelukast (SINGULAIR) 10 MG tablet Take 10 mg by mouth at bedtime.      omeprazole (PRILOSEC) 20 MG capsule Take 20 mg by mouth 2 (two) times daily.      potassium chloride SA (K-DUR,KLOR-CON) 20 MEQ tablet Take 20 mEq by mouth daily.     pravastatin (PRAVACHOL) 40 MG tablet Take 40 mg by mouth at bedtime.     predniSONE (DELTASONE) 20 MG tablet Take 20 mg by mouth every morning. 40 mg daily    valsartan (DIOVAN) 320 MG tablet     warfarin (COUMADIN) 5 MG tablet Take 5 mg by mouth every  evening.        STOP taking these medications     dicyclomine (BENTYL) 10 MG capsule      dicyclomine (BENTYL) 10 MG capsule      Cholecalciferol (VITAMIN D) 400 UNITS capsule          Disposition and Follow-up: Patient will be discharged home today in stable and improved condition. Dr. Izell [place] office will call her with an outpatient followup appointment.  Consults:  cardiology [name].   Significant Diagnostic Studies:  Dg Chest Portable 1 View  03/04/2011  *RADIOLOGY REPORT*  Clinical Data: Chest pain  PORTABLE CHEST - 1 VIEW  Comparison: Portable exam 1635 hours compared to 10/18/2010  Findings: Enlargement of cardiac silhouette. Mediastinal contours and pulmonary vascularity normal. Atherosclerotic calcification aortic arch. Lordotic positioning. No gross infiltrate or effusion. No pneumothorax.  IMPRESSION: Enlargement of cardiac silhouette. No acute abnormalities.  Original Report Authenticated By: Burnetta Sabin, M.D.    Brief H and P: For complete details please refer to admission H and P, but in brief  [name] is a 70 year old obese white woman who was initially admitted to the hospital on March 04 2011 with complaints of chest pain. She has a history of coronary artery disease and had been lost to followup with the cardiologist for 2 years. She  also has a history of pulmonary embolism and atrial fibrillation and had been maintained on chronic anticoagulation with Coumadin. Because of this we were asked to admit her for further evaluation and management.    Hospital Course:  Principal Problem:  *Chest pressure Active Problems:  HYPOTHYROIDISM  DM  HYPERLIPIDEMIA  HYPERTENSION  CORONARY ARTERY DISEASE  PULMONARY EMBOLISM  FIBRILLATION, ATRIAL  GERD  DOE (dyspnea on exertion)  Polymyalgia rheumatica  Anemia   #1 chest pain: D-dimer was negative. Her INR has been therapeutic. Because of her history of coronary artery disease and her lack of followup  with cardiology, an inpatient nuclear medicine stress test was performed. I received a call by [name] to inform me that her study is low risk and that it is okay to discharge her home today and that they will arrange outpatient followup. She is no longer complaining of chest pain and has ambulated down the hallways today with physical therapy without complaints of shortness of breath or chest pain. We will discharge her home today.  Rest of chronic conditions are stable. Home medications have not been altered.  Time spent on Discharge:  greater than 30 minutes.  SignedLelon Frohlich 03/07/2011, 5:41 PM

## 2011-03-07 NOTE — Progress Notes (Signed)
[place] for Warfarin Indication: Hx of PE--continuation of home dose.  Allergies  Allergen Reactions  . Demerol Nausea And Vomiting   Patient Measurements: Height: 5\' 3"  (160 cm) Weight: 261 lb 4.8 oz (118.525 kg) IBW/kg (Calculated) : 52.4   Vital Signs: Temp: 97.5 F (36.4 C) (09/13 0400) Temp src: Oral (09/13 0400) BP: 149/72 mmHg (09/13 0400) Pulse Rate: 63  (09/13 0400)  Labs:  Basename 03/07/11 0526 03/06/11 0714 03/05/11 1557 03/05/11 0749 03/05/11 0445 03/05/11 0430 03/04/11 1627  HGB 10.6* -- -- -- -- 10.0* --  HCT 34.7* -- -- -- -- 32.6* 35.1*  PLT 316 -- -- -- -- 318 326  APTT -- -- -- -- -- -- 46*  LABPROT 23.3* 24.4* -- 27.4* -- -- --  INR 2.03* 2.15* -- 2.50* -- -- --  HEPARINUNFRC -- -- -- -- -- -- --  CREATININE 0.86 -- -- -- -- 0.80 0.86  CRCLEARANCE -- -- -- -- -- -- --  CKTOTAL -- -- 65 -- 53 -- --  CKMB -- -- 2.5 -- 2.4 -- --  TROPONINI -- -- <0.30 -- <0.30 -- --   Medical History: Past Medical History  Diagnosis Date  . Paroxysmal atrial fibrillation     normal LV function; episodes occurred in 205 and 09/2007  . ASCVD (arteriosclerotic cardiovascular disease)     stent to mid and proximal left anterior descending in 06/2002;drug eluting stent placed in the second diagnol in 08/2003 after  A  non-st elevation myocardial infarction   . Tobacco user     stopped   . Hyperlipidemia     pulmonary embolism 2000 and 09/2008  . Hypertension   . Pulmonary embolism     2000/09/2008  . Thyroid disease     hypothyroidism  . Allergy history unknown   . GERD (gastroesophageal reflux disease)   . Diabetes mellitus     no insulin  . Asthma   . FH: colonic polyps     adenomataous  . Anticoagulated   . Diarrhea     acute  . Rectal bleeding   . Pedal edema    Medications:  Home dose is Warfarin 5mg  daily  Assessment: INR therapeutic  Goal of Therapy:  INR 2-3   Plan:  Continue Warfarin 5mg  daily.  Hart Robinsons A 03/07/2011,8:06 AM

## 2011-03-07 NOTE — Progress Notes (Signed)
Physical Therapy Evaluation Patient Name: [name] [id number] Date: 03/07/2011 Problem List:  Patient Active Problem List  Diagnoses  . COLONIC POLYPS, ADENOMATOUS  . HYPOTHYROIDISM  . DM  . HYPERLIPIDEMIA  . HYPERTENSION  . CORONARY ARTERY DISEASE  . PULMONARY EMBOLISM  . FIBRILLATION, ATRIAL  . ASTHMA  . GERD  . TOBACCO USE, QUIT  . Peripheral neuropathy  . Chest pressure  . DOE (dyspnea on exertion)  . Polymyalgia rheumatica  . Anemia   Past Medical History:  Past Medical History  Diagnosis Date  . Paroxysmal atrial fibrillation     normal LV function; episodes occurred in 205 and 09/2007  . ASCVD (arteriosclerotic cardiovascular disease)     stent to mid and proximal left anterior descending in 06/2002;drug eluting stent placed in the second diagnol in 08/2003 after  A  non-st elevation myocardial infarction   . Tobacco user     stopped   . Hyperlipidemia     pulmonary embolism 2000 and 09/2008  . Hypertension   . Pulmonary embolism     2000/09/2008  . Thyroid disease     hypothyroidism  . Allergy history unknown   . GERD (gastroesophageal reflux disease)   . Diabetes mellitus     no insulin  . Asthma   . FH: colonic polyps     adenomataous  . Anticoagulated   . Diarrhea     acute  . Rectal bleeding   . Pedal edema    Past Surgical History:  Past Surgical History  Procedure Date  . Cleft palate repair     4 surgeries  . Abdominal hysterectomy   . Appendectomy   . Cholecystectomy   . Heel spur surgery   . Knee surgery   . Breast reduction surgery   . Colonoscopy 2011    negative    Precautions/Restrictions  Restrictions Weight Bearing Restrictions: No Prior Functioning  Home Living Type of Home: Apartment Lives With: Alone Receives Help From: Friend(s) Home Layout: One level Home Access: Level entry Bathroom Shower/Tub: Tub/shower unit Home Adaptive Equipment: Straight cane;Walker - rolling Prior Function Level of Independence:  Independent with basic ADLs Driving: Yes Vocation: Retired Artist: Awake/alert Overall Cognitive Status: Appears within functional limits for tasks assessed Sensation/Coordination Coordination Gross Motor Movements are Fluid and Coordinated: Yes Fine Motor Movements are Fluid and Coordinated: Not tested Extremity Assessment   Mobility (including Balance) Bed Mobility Bed Mobility: Yes Supine to Sit: 7: Independent Transfers Transfers: Yes Stand Pivot Transfers: 7: Independent Ambulation/Gait Ambulation/Gait: Yes Ambulation/Gait Assistance: 7: Independent Ambulation Distance (Feet): 300 Feet Assistive device: None Gait Pattern: Within Functional Limits Stairs: No    Exercise     End of Session PT - End of Session Equipment Utilized During Treatment: Gait belt Activity Tolerance: Patient tolerated treatment well Patient left: in chair;with call bell in reach General Behavior During Session: [facility] for tasks performed Cognition: [facility] for tasks performed PT Assessment/Plan/Recommendation PT Assessment Clinical Impression Statement: Pt slightly SOB after ambulating spoke to patient about a walking program. PT Recommendation/Assessment: Patent does not need any further PT services No Skilled PT: Patient is modified independent with all activity/mobility PT Goals    [name],CINDY 03/07/2011, 3:08 PM

## 2011-03-07 NOTE — Consult Note (Signed)
SUBJECTIVE:C/O generalized fatigue and muscle aches.  No chest pain unless "I get up and do something."   Filed Vitals:   03/06/11 1428 03/06/11 2017 03/06/11 2223 03/07/11 0400  BP: 114/68  134/90 149/72  Pulse: 69  62 63  Temp: 98.5 F (36.9 C)  98.1 F (36.7 C) 97.5 F (36.4 C)  TempSrc:   Oral Oral  Resp: 18  19 19   Height:      Weight:    261 lb 4.8 oz (118.525 kg)  SpO2: 93% 95% 91% 96%    Intake/Output Summary (Last 24 hours) at 03/07/11 1102 Last data filed at 03/06/11 2224  Gross per 24 hour  Intake    460 ml  Output      0 ml  Net    460 ml    LABS: Basic Metabolic Panel:  Basename 03/07/11 0526 03/05/11 0430  NA 141 143  K 4.2 3.8  CL 102 102  CO2 35* 34*  GLUCOSE 104* 119*  BUN 20 22  CREATININE 0.86 0.80  CALCIUM 9.4 9.6  MG -- 1.9  PHOS -- --    CBC:  Basename 03/07/11 0526 03/05/11 0430  WBC 12.5* 12.6*  NEUTROABS -- --  HGB 10.6* 10.0*  HCT 34.7* 32.6*  MCV 80.9 80.7  PLT 316 318   Cardiac Enzymes:  Basename 03/05/11 1557 03/05/11 0445  CKTOTAL 65 53  CKMB 2.5 2.4  CKMBINDEX -- --  TROPONINI <0.30 <0.30   D-Dimer:  Basename 03/05/11 0749 03/04/11 1627  DDIMER 0.22 0.22   Hemoglobin A1C:  Basename 03/05/11 0430  HGBA1C 7.0*   Thyroid Function Tests:  Basename 03/05/11 0430  TSH 0.926  T4TOTAL --  T3FREE --  THYROIDAB --   Anemia Panel:  Basename 03/07/11 0842 03/05/11 0430  VITAMINB12 -- 436  FOLATE -- 12.1  FERRITIN -- 15  TIBC -- 314  IRON -- 25*  RETICCTPCT 2.1 --    RADIOLOGY: Dg Chest Portable 1 View  03/04/2011  *RADIOLOGY REPORT*  Clinical Data: Chest pain  PORTABLE CHEST - 1 VIEW  Comparison: Portable exam 1635 hours compared to 10/18/2010  Findings: Enlargement of cardiac silhouette. Mediastinal contours and pulmonary vascularity normal. Atherosclerotic calcification aortic arch. Lordotic positioning. No gross infiltrate or effusion. No pneumothorax.  IMPRESSION: Enlargement of cardiac silhouette. No  acute abnormalities.  Original Report Authenticated By: Burnetta Sabin, M.D.    PHYSICAL EXAM General: Well developed, well nourished,obese in no acute distress Head: Eyes PERRLA, No xanthomas.   Normal cephalic and atramatic  Lungs: Clear bilaterally to auscultation and percussion. Heart: HRRR S1 S2. Pulses are 2+ & equal.            No carotid bruit. No JVD.  No abdominal bruits. No femoral bruits. Abdomen: Bowel sounds are positive, abdomen soft and non-tender without masses or   Hernia's noted. Msk:  Back normal, normal gait. Normal strength and tone for age. Extremities: No clubbing, cyanosis or edema.  DP +1 Neuro: Alert and oriented X 3. Psych:  Good affect, responds appropriately  TELEMETRY: Reviewed telemetry pt in NSR  ASSESSMENT AND PLAN:  Principal Problem:  *Chest pressure Active Problems:  HYPOTHYROIDISM  DM  HYPERLIPIDEMIA  HYPERTENSION  CORONARY ARTERY DISEASE  PULMONARY EMBOLISM  FIBRILLATION, ATRIAL  GERD  DOE (dyspnea on exertion)  Polymyalgia rheumatica  Anemia   Hypertension-Blood pressure is well-controlled with current therapy, No changes to medications at this time. Chest pain- Only with minimal exertion. She has had both days of nuclear  testing completed. If negative, she can be discharged.  Will have follow-up appointment with [place] in a few weeks. 3. Deconditioning: She states she has polymyalgia rheumatica with makes ambulation very difficult. PT is now on board for assistance.  Phill Myron. [place] NP [facility]  Cardiology Attending  Patient is doing well with minimal symptoms.  Stress nuclear study shows a predominantly fixed inferior defect that likely represents breast attenuation artifact, but could reflect myocardial scar with minimal superimposed ischemia.  In either case, this is a low-risk study, and patient can be discharged with continuing medical management.  We will arrange followup in the office.  Jacqulyn Ducking, MD

## 2011-03-07 NOTE — Progress Notes (Signed)
Subjective: Feels well today. No chest pain or shortness of breath, although she feels very weak especially with ambulation.  Objective: Vital signs in last 24 hours: Temp:  [97.5 F (36.4 C)-98.5 F (36.9 C)] 97.5 F (36.4 C) (09/13 0400) Pulse Rate:  [62-79] 63  (09/13 0400) Resp:  [18-19] 19  (09/13 0400) BP: (114-149)/(68-90) 149/72 mmHg (09/13 0400) SpO2:  [91 %-96 %] 96 % (09/13 0400) Weight:  [118.026 kg (260 lb 3.2 oz)-118.525 kg (261 lb 4.8 oz)] 261 lb 4.8 oz (118.525 kg) (09/13 0400) Weight change: 0.026 kg (0.9 oz) Last BM Date: 03/05/11  Intake/Output from previous day: 09/12 0701 - 09/13 0700 In: 460 [P.O.:460] Out: -      Physical Exam: General: Alert, awake, oriented x3, in no acute distress. HEENT: No bruits, no goiter. Heart: Regular rate and rhythm, without murmurs, rubs, gallops. Lungs: Clear to auscultation bilaterally. Abdomen: Soft, nontender, nondistended, positive bowel sounds. Extremities: No clubbing cyanosis or edema with positive pedal pulses. Neuro: Grossly intact, nonfocal.    Lab Results:  Basename 03/07/11 0526 03/05/11 0430  WBC 12.5* 12.6*  HGB 10.6* 10.0*  HCT 34.7* 32.6*  PLT 316 318    Basename 03/07/11 0526 03/05/11 0430  NA 141 143  K 4.2 3.8  CL 102 102  CO2 35* 34*  GLUCOSE 104* 119*  BUN 20 22  CREATININE 0.86 0.80  CALCIUM 9.4 9.6   Recent Results (from the past 240 hour(s))  MRSA PCR SCREENING     Status: Normal   Collection Time   03/05/11  6:24 PM      Component Value Range Status Comment   MRSA by PCR NEGATIVE  NEGATIVE  Final      Studies/Results: No results found.  Medications: Scheduled Meds:   . citalopram  40 mg Oral QHS  . dicyclomine  10 mg Oral BID  . diltiazem  300 mg Oral Daily  . fluticasone  1 puff Inhalation QHS  . furosemide  80 mg Oral Daily  . insulin aspart  0-15 Units Subcutaneous TID WC  . insulin aspart  0-5 Units Subcutaneous QHS  . levothyroxine  50 mcg Oral QHS  . metFORMIN   500 mg Oral BID WC  . montelukast  10 mg Oral QHS  . olmesartan  40 mg Oral Daily  . pantoprazole  40 mg Oral BID AC  . potassium chloride SA  20 mEq Oral Daily  . pravastatin  40 mg Oral Daily  . predniSONE  20 mg Oral QAM  . regadenoson      . sodium chloride      . warfarin  5 mg Oral QPM   Continuous Infusions:  PRN Meds:.acetaminophen, acetaminophen, albuterol, bisacodyl, nitroGLYCERIN, ondansetron (ZOFRAN) IV, ondansetron, polyethylene glycol, sodium chloride, sodium phosphate, technetium tetrofosmin, traZODone  Assessment/Plan:  Principal Problem:  *Chest pressure Active Problems:  HYPOTHYROIDISM  DM  HYPERLIPIDEMIA  HYPERTENSION  CORONARY ARTERY DISEASE  PULMONARY EMBOLISM  FIBRILLATION, ATRIAL  GERD  DOE (dyspnea on exertion)  Polymyalgia rheumatica  Anemia  #1 chest pain in patient with known history of coronary artery disease: Nuclear stress test performed yesterday. I do not see results in the system yet. As per Dr. Izell [place] plan we will proceed with discharge home if stress test is negative. D-dimer is surprisingly within normal limits, this rules out an acute thromboembolic event. She is already anticoagulated on Coumadin for her history of A. fib and prior PE.  #2 hypothyroidism: TSH is within normal limits. Continue current  dose of Synthroid.  #3 diabetes: CBGs have been well controlled.  #4 hypertension: Well controlled. Continue current medications.  #5 atrial fibrillation: She has been in normal sinus rhythm throughout this hospitalization. She is anticoagulated on Coumadin. INR is therapeutic.  #6 disposition: Plan for discharge home this afternoon if stress test is negative.   LOS: 3 days   HERNANDEZ [name],[name] 03/07/2011, 8:40 AM

## 2011-03-12 ENCOUNTER — Encounter (INDEPENDENT_AMBULATORY_CARE_PROVIDER_SITE_OTHER): Payer: Self-pay | Admitting: Internal Medicine

## 2011-03-12 ENCOUNTER — Ambulatory Visit (INDEPENDENT_AMBULATORY_CARE_PROVIDER_SITE_OTHER): Payer: Medicare Other | Admitting: Internal Medicine

## 2011-03-12 VITALS — BP 128/76 | HR 74 | Temp 98.3°F | Resp 20 | Ht 63.0 in | Wt 255.0 lb

## 2011-03-12 DIAGNOSIS — R197 Diarrhea, unspecified: Secondary | ICD-10-CM

## 2011-03-12 DIAGNOSIS — K589 Irritable bowel syndrome without diarrhea: Secondary | ICD-10-CM

## 2011-03-12 MED ORDER — DIPHENOXYLATE-ATROPINE 2.5-0.025 MG PO TABS: 1.0000 | ORAL_TABLET | Freq: Two times a day (BID) | ORAL | Status: DC

## 2011-03-12 NOTE — Patient Instructions (Addendum)
Continue dicyclomine at current dose. Lomotil 1 tablet by mouth before breakfast and lunch daily. Fiber supplement 2 to 4 gm daily. Align 1 capsule daily by mouth. Call us with progress report in 2 weeks.

## 2011-03-12 NOTE — Progress Notes (Signed)
Presenting complaint; diarrhea has returned. Subjective; patient is a 70 year old Caucasian female patient of Dr. Richardson Landry looking his who was last seen in January and February this year for diarrhea felt to be due to irritable bowel syndrome. When I last visit she indicated she was having anywhere from one to 3 formed stools per day. Now she presents  with diarrhea. It started a few weeks ago. Lately she's been having a bowel movement every half an hour while she is awake. Usually it is small volume and liquid. She's had multiple accidents. She denies nocturnal bowel movements even though she gets up twice to empty her bladder. He denies melena or rectal bleeding or recent antibiotic use or travel outside the country. She denies fever but she occasionally does experience sweating and chills. She also complains of dysphagia but with pills only. She has PMR and she cannot function unless she takes prednisone 20 mg daily. She has gained 11 pounds since her last visit. Patient states she has taken Lomotil in the past and it has worked; has not experienced any side effects. Current medications; Current Outpatient Prescriptions on File Prior to Visit  Medication Sig Dispense Refill  . albuterol (PROAIR HFA) 108 (90 BASE) MCG/ACT inhaler Inhale 2 puffs into the lungs every 4 (four) hours as needed. For shortness of breath       . Cholecalciferol (VITAMIN D3) 2000 UNITS TABS Take 1 tablet by mouth daily.        . citalopram (CELEXA) 20 MG tablet Take 40 mg by mouth at bedtime.        Marland Kitchen diltiazem (CARDIZEM CD) 300 MG 24 hr capsule       . fluticasone (FLOVENT HFA) 44 MCG/ACT inhaler Inhale 1 puff into the lungs at bedtime.       . furosemide (LASIX) 80 MG tablet Take 80 mg by mouth daily.       . Levothyroxine Sodium 50 MCG CAPS Take 1 capsule by mouth at bedtime.       . metFORMIN (GLUCOPHAGE) 500 MG tablet Take 500 mg by mouth 2 (two) times daily with a meal.        . montelukast (SINGULAIR) 10 MG tablet  Take 10 mg by mouth at bedtime.        . nitroGLYCERIN (NITROSTAT) 0.4 MG SL tablet Place 1 tablet (0.4 mg total) under the tongue every 5 (five) minutes as needed for chest pain.  90 tablet  0  . omeprazole (PRILOSEC) 20 MG capsule Take 20 mg by mouth 2 (two) times daily.        . potassium chloride SA (K-DUR,KLOR-CON) 20 MEQ tablet Take 20 mEq by mouth daily.       . pravastatin (PRAVACHOL) 40 MG tablet Take 40 mg by mouth at bedtime.       . predniSONE (DELTASONE) 20 MG tablet Take 20 mg by mouth daily. Daily      . valsartan (DIOVAN) 320 MG tablet 320 mg daily.       Marland Kitchen warfarin (COUMADIN) 5 MG tablet Take 5 mg by mouth every evening.         past medical history Chronic GERD with multiple esophageal dilations in the past most recently in December 2010. history of IBS. She was treated for ischemic colitis back in 1999 A small tubular adenoma removed from her colon in September 2005  Patient's last colonoscopy was in December 2010. He had incidental finding of sigmoid diverticulitis and was treated with antibiotic therapy. She  has coronary artery disease with stenting in the past. Hypothyroidism Hypertension. Of diabetes mellitus. Hyperlipidemia. PMR Bronchial asthma and allergies. 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 she also has had polyp removal from vocal cord. Allergies  Allergen Reactions  . Demerol Nausea And Vomiting   objective; Conjunctiva is pink. Sclerae nonicteric Oropharyngeal mucosa is normal the scarring to her palate. No neck masses or thyromegaly noted.  Abdomen is obese. Bowel sounds are hyperactive. On palpation soft abdomen without tenderness again a megaly or masses. She has 1+ LE edema.. Assessment; Suspect patient's diarrhea secondary to irritable bowel syndrome. She is having multiple bowel movements she does not appear to be dehydrated and she had she has gained 11 pounds since her last  visit. I wonder if metformin is also  contributing to her diarrhea. If she does not respond to the following will aske Dr. Richardson Landry looking  if she could get off metformin. Plan; Continue dicyclomine at current dose. Lomotil  1 tablet before breakfast and 1 before lunch. Fiber supplement 2-4 g daily. Align 1 capsule daily;  samples given. Progress report in 2 weeks

## 2011-03-19 LAB — DIFFERENTIAL
Basophils Absolute: 0.1
Basophils Relative: 1
Eosinophils Relative: 0
Lymphocytes Relative: 24
Monocytes Absolute: 1.2 — ABNORMAL HIGH

## 2011-03-19 LAB — BASIC METABOLIC PANEL
CO2: 32
Chloride: 105
Glucose, Bld: 79
Potassium: 4
Sodium: 145

## 2011-03-19 LAB — CBC
HCT: 39.3
Hemoglobin: 13.1
MCHC: 33.2
MCV: 80.8
RDW: 18 — ABNORMAL HIGH

## 2011-03-19 LAB — POCT CARDIAC MARKERS: CKMB, poc: 1.8

## 2011-03-22 LAB — CBC
HCT: 37.2
Hemoglobin: 11.9 — ABNORMAL LOW
MCHC: 32
MCV: 83.1
Platelets: 337
RBC: 4.48
RDW: 18.4 — ABNORMAL HIGH
WBC: 17.7 — ABNORMAL HIGH

## 2011-03-22 LAB — BASIC METABOLIC PANEL
GFR calc non Af Amer: 54 — ABNORMAL LOW
Glucose, Bld: 173 — ABNORMAL HIGH
Potassium: 4.2
Sodium: 143

## 2011-03-22 LAB — DIFFERENTIAL
Basophils Absolute: 0
Eosinophils Relative: 0
Lymphocytes Relative: 6 — ABNORMAL LOW
Lymphs Abs: 1
Monocytes Absolute: 0.4
Monocytes Relative: 3

## 2011-03-25 LAB — BASIC METABOLIC PANEL
CO2: 29
Calcium: 9.6
Glucose, Bld: 177 — ABNORMAL HIGH
Sodium: 145

## 2011-03-25 LAB — DIFFERENTIAL
Basophils Absolute: 0
Basophils Relative: 0
Eosinophils Absolute: 0
Eosinophils Relative: 0
Monocytes Absolute: 0.1
Neutro Abs: 12.1 — ABNORMAL HIGH

## 2011-03-25 LAB — CARDIAC PANEL(CRET KIN+CKTOT+MB+TROPI)
CK, MB: 1.9
CK, MB: 2.7
Relative Index: INVALID
Total CK: 33
Troponin I: 0.01

## 2011-03-25 LAB — CBC
HCT: 40.2
Hemoglobin: 13.2
MCHC: 32.9
MCV: 83
RDW: 18.1 — ABNORMAL HIGH

## 2011-03-25 LAB — GLUCOSE, CAPILLARY
Glucose-Capillary: 128 — ABNORMAL HIGH
Glucose-Capillary: 133 — ABNORMAL HIGH
Glucose-Capillary: 187 — ABNORMAL HIGH
Glucose-Capillary: 224 — ABNORMAL HIGH
Glucose-Capillary: 225 — ABNORMAL HIGH
Glucose-Capillary: 240 — ABNORMAL HIGH
Glucose-Capillary: 250 — ABNORMAL HIGH
Glucose-Capillary: 273 — ABNORMAL HIGH

## 2011-04-08 ENCOUNTER — Ambulatory Visit (INDEPENDENT_AMBULATORY_CARE_PROVIDER_SITE_OTHER): Payer: Medicare Other | Admitting: Internal Medicine

## 2011-04-15 ENCOUNTER — Other Ambulatory Visit: Payer: Self-pay | Admitting: Cardiology

## 2011-05-07 ENCOUNTER — Ambulatory Visit (HOSPITAL_COMMUNITY)
Admission: RE | Admit: 2011-05-07 | Discharge: 2011-05-07 | Disposition: A | Discharge status: Home / Self Care | Payer: Medicare Other | Source: Ambulatory Visit | Attending: Family Medicine | Admitting: Family Medicine

## 2011-05-07 ENCOUNTER — Other Ambulatory Visit: Payer: Self-pay | Admitting: Family Medicine

## 2011-05-07 DIAGNOSIS — R079 Chest pain, unspecified: Secondary | ICD-10-CM | POA: Insufficient documentation

## 2011-05-21 ENCOUNTER — Other Ambulatory Visit: Payer: Self-pay | Admitting: Family Medicine

## 2011-05-21 DIAGNOSIS — Z79899 Other long term (current) drug therapy: Secondary | ICD-10-CM

## 2011-05-24 ENCOUNTER — Other Ambulatory Visit (HOSPITAL_COMMUNITY): Payer: Medicare Other

## 2011-05-28 ENCOUNTER — Ambulatory Visit (HOSPITAL_COMMUNITY)
Admission: RE | Admit: 2011-05-28 | Discharge: 2011-05-28 | Disposition: A | Discharge status: Home / Self Care | Payer: Medicare Other | Source: Ambulatory Visit | Attending: Family Medicine | Admitting: Family Medicine

## 2011-05-28 DIAGNOSIS — M899 Disorder of bone, unspecified: Secondary | ICD-10-CM | POA: Insufficient documentation

## 2011-05-28 DIAGNOSIS — Z79899 Other long term (current) drug therapy: Secondary | ICD-10-CM

## 2011-05-28 DIAGNOSIS — Z780 Asymptomatic menopausal state: Secondary | ICD-10-CM | POA: Insufficient documentation

## 2011-05-31 ENCOUNTER — Emergency Department (HOSPITAL_COMMUNITY)
Admission: EM | Admit: 2011-05-31 | Discharge: 2011-05-31 | Disposition: A | Discharge status: Home / Self Care | Payer: Medicare Other | Attending: Emergency Medicine | Admitting: Emergency Medicine

## 2011-05-31 ENCOUNTER — Emergency Department (HOSPITAL_COMMUNITY): Payer: Medicare Other

## 2011-05-31 ENCOUNTER — Encounter (HOSPITAL_COMMUNITY): Payer: Self-pay | Admitting: *Deleted

## 2011-05-31 DIAGNOSIS — K219 Gastro-esophageal reflux disease without esophagitis: Secondary | ICD-10-CM | POA: Insufficient documentation

## 2011-05-31 DIAGNOSIS — I4891 Unspecified atrial fibrillation: Secondary | ICD-10-CM | POA: Insufficient documentation

## 2011-05-31 DIAGNOSIS — M545 Low back pain, unspecified: Secondary | ICD-10-CM | POA: Insufficient documentation

## 2011-05-31 DIAGNOSIS — I2510 Atherosclerotic heart disease of native coronary artery without angina pectoris: Secondary | ICD-10-CM | POA: Insufficient documentation

## 2011-05-31 DIAGNOSIS — Z79899 Other long term (current) drug therapy: Secondary | ICD-10-CM | POA: Insufficient documentation

## 2011-05-31 DIAGNOSIS — I10 Essential (primary) hypertension: Secondary | ICD-10-CM | POA: Insufficient documentation

## 2011-05-31 DIAGNOSIS — E039 Hypothyroidism, unspecified: Secondary | ICD-10-CM | POA: Insufficient documentation

## 2011-05-31 DIAGNOSIS — E785 Hyperlipidemia, unspecified: Secondary | ICD-10-CM | POA: Insufficient documentation

## 2011-05-31 DIAGNOSIS — Z7901 Long term (current) use of anticoagulants: Secondary | ICD-10-CM | POA: Insufficient documentation

## 2011-05-31 DIAGNOSIS — E669 Obesity, unspecified: Secondary | ICD-10-CM | POA: Insufficient documentation

## 2011-05-31 DIAGNOSIS — K589 Irritable bowel syndrome without diarrhea: Secondary | ICD-10-CM | POA: Insufficient documentation

## 2011-05-31 DIAGNOSIS — Z860100 Personal history of colon polyps, unspecified: Secondary | ICD-10-CM | POA: Insufficient documentation

## 2011-05-31 DIAGNOSIS — Z86718 Personal history of other venous thrombosis and embolism: Secondary | ICD-10-CM | POA: Insufficient documentation

## 2011-05-31 DIAGNOSIS — J45909 Unspecified asthma, uncomplicated: Secondary | ICD-10-CM | POA: Insufficient documentation

## 2011-05-31 DIAGNOSIS — Z87891 Personal history of nicotine dependence: Secondary | ICD-10-CM | POA: Insufficient documentation

## 2011-05-31 MED ORDER — IBUPROFEN 800 MG PO TABS
800.0000 mg | ORAL_TABLET | Freq: Once | ORAL | Status: AC
  Administered 2011-05-31: 800 mg via ORAL
  Filled 2011-05-31: qty 1

## 2011-05-31 MED ORDER — CYCLOBENZAPRINE HCL 10 MG PO TABS: 10.0000 mg | ORAL_TABLET | Freq: Two times a day (BID) | ORAL | Status: AC | PRN

## 2011-05-31 MED ORDER — NAPROXEN 500 MG PO TABS: 500.0000 mg | ORAL_TABLET | Freq: Two times a day (BID) | ORAL | Status: DC

## 2011-05-31 MED ORDER — ACETAMINOPHEN 500 MG PO TABS
1000.0000 mg | ORAL_TABLET | Freq: Once | ORAL | Status: AC
  Administered 2011-05-31: 1000 mg via ORAL
  Filled 2011-05-31: qty 1

## 2011-05-31 NOTE — ED Provider Notes (Signed)
History    Scribed for Nat Christen, MD, the patient was seen in room APA08/APA08. This chart was scribed by Lyndee Hensen.   CSN: [id number] Arrival date & time: 05/31/2011 11:13 AM   First MD Initiated Contact with Patient 05/31/11 1208      Chief Complaint  Patient presents with  . Back Pain     (Consider location/radiation/quality/duration/timing/severity/associated sxs/prior treatment) Patient is a 70 y.o. female presenting with back pain. The history is provided by the patient. No language interpreter was used.  Back Pain  This is a chronic problem. Episode onset: about 3 months ago. The problem has been gradually worsening. The pain is associated with no known injury. The pain is present in the lumbar spine. The pain does not radiate. The pain is moderate. She has tried muscle relaxants (oxycodine 5/325, Parafon Forte) for the symptoms. The treatment provided mild relief. Risk factors include obesity.   Patient lives alone.  Arrives via EMS.  Patient denies injury or fall.  Patient with history of fibromyalgia.    PCP Rubbie Battiest, MD, MD   Past Medical History  Diagnosis Date  . Paroxysmal atrial fibrillation     normal LV function; episodes occurred in 205 and 09/2007  . ASCVD (arteriosclerotic cardiovascular disease)     stent to mid and proximal left anterior descending in 06/2002;drug eluting stent placed in the second diagnol in 08/2003 after  A  non-st elevation myocardial infarction   . Tobacco user     stopped   . Hyperlipidemia     pulmonary embolism 2000 and 09/2008  . Hypertension   . Pulmonary embolism     2000/09/2008  . Thyroid disease     hypothyroidism  . Allergy history unknown   . GERD (gastroesophageal reflux disease)   . Diabetes mellitus     no insulin  . Asthma   . FH: colonic polyps     adenomataous  . Anticoagulated   . Diarrhea     acute  . Rectal bleeding   . Pedal edema   . Irritable bowel syndrome     Past Surgical History  Procedure  Date  . Cleft palate repair     4 surgeries  . Abdominal hysterectomy   . Appendectomy   . Cholecystectomy   . Heel spur surgery   . Knee surgery   . Breast reduction surgery   . Colonoscopy 2011    negative    Family History  Problem Relation Age of Onset  . Heart attack Mother   . Heart attack Father     History  Substance Use Topics  . Smoking status: Former Research scientist (life sciences)  . Smokeless tobacco: Never Used  . Alcohol Use: No    OB History    Grav Para Term Preterm Abortions TAB SAB Ect Mult Living                  Review of Systems  Musculoskeletal: Positive for back pain.  All other systems reviewed and are negative.    Allergies  Demerol  Home Medications   Current Outpatient Rx  Name Route Sig Dispense Refill  . ALBUTEROL SULFATE HFA 108 (90 BASE) MCG/ACT IN AERS Inhalation Inhale 2 puffs into the lungs every 4 (four) hours as needed. For shortness of breath     . CHLORZOXAZONE 500 MG PO TABS Oral Take 500 mg by mouth 3 (three) times daily as needed. Muscle spasms     . VITAMIN D3 2000 UNITS PO TABS  Oral Take 1 tablet by mouth daily.      Marland Kitchen CITALOPRAM HYDROBROMIDE 20 MG PO TABS Oral Take 40 mg by mouth at bedtime.      Marland Kitchen DILTIAZEM HCL ER COATED BEADS 300 MG PO CP24      . FLUTICASONE PROPIONATE  HFA 44 MCG/ACT IN AERO Inhalation Inhale 2 puffs into the lungs at bedtime.     . FUROSEMIDE 40 MG PO TABS Oral Take 40 mg by mouth daily. Sometimes patient takes 2 tabs a day     . GLYBURIDE 5 MG PO TABS Oral Take 5 mg by mouth daily with breakfast.      . K-DUR 20 MEQ PO TBCR  TAKE (1) TABLET TWICE DAILY. 60 each 12  . LEVOTHYROXINE SODIUM 50 MCG PO CAPS Oral Take 1 capsule by mouth at bedtime.     Marland Kitchen MONTELUKAST SODIUM 10 MG PO TABS Oral Take 10 mg by mouth at bedtime.      . OMEPRAZOLE 20 MG PO CPDR Oral Take 20 mg by mouth 2 (two) times daily.      . OXYCODONE-ACETAMINOPHEN 5-325 MG PO TABS Oral Take 0.5-1 tablets by mouth every 6 (six) hours as needed. pain     .  PRAVASTATIN SODIUM 80 MG PO TABS Oral Take 80 mg by mouth daily.      Marland Kitchen PREDNISONE 5 MG PO TABS Oral Take 15 mg by mouth daily.      Marland Kitchen VALSARTAN 320 MG PO TABS  320 mg every morning.     . WARFARIN SODIUM 5 MG PO TABS Oral Take 5 mg by mouth every evening. Only takes 0.5 tablet on Tuesday to equal dose of 2.5mg , all other days dose is 5mg     . CYCLOBENZAPRINE HCL 10 MG PO TABS Oral Take 1 tablet (10 mg total) by mouth 2 (two) times daily as needed for muscle spasms. 20 tablet 0  . NAPROXEN 500 MG PO TABS Oral Take 1 tablet (500 mg total) by mouth 2 (two) times daily. 30 tablet 0  . NITROGLYCERIN 0.4 MG SL SUBL Sublingual Place 1 tablet (0.4 mg total) under the tongue every 5 (five) minutes as needed for chest pain. 90 tablet 0    BP 136/50  Pulse 53  Temp(Src) 98.2 F (36.8 C) (Oral)  Resp 22  Ht 5\' 3"  (1.6 m)  Wt 260 lb (117.935 kg)  BMI 46.06 kg/m2  SpO2 98%  Physical Exam  Constitutional: She is oriented to person, place, and time. She appears well-developed and well-nourished.  Non-toxic appearance. She does not have a sickly appearance.       obese  HENT:  Head: Normocephalic and atraumatic.  Eyes: Conjunctivae, EOM and lids are normal. Pupils are equal, round, and reactive to light. No scleral icterus.  Neck: Trachea normal and normal range of motion. Neck supple.  Cardiovascular: Regular rhythm and normal heart sounds.   Pulmonary/Chest: Effort normal and breath sounds normal.  Abdominal: Soft. Normal appearance. There is no tenderness. There is no rebound, no guarding and no CVA tenderness.  Musculoskeletal: Normal range of motion.       Left Lower lumbar spine tenderness   Neurological: She is alert and oriented to person, place, and time. She has normal strength.       Negative straight leg test   Skin: Skin is warm, dry and intact. No rash noted.    ED Course  Procedures (including critical care time)   DIAGNOSTIC STUDIES: Oxygen Saturation is 98%  on room air,  normal by my interpretation.     COORDINATION OF CARE:  12:37 PM  Physical exam complete.  Will xray lumbar spine.  2:42 PM  Plan to discharge patient.  Patient agrees with plan.      MEDICATIONS GIVEN IN ED:  Scheduled Meds:    . acetaminophen  1,000 mg Oral Once  . ibuprofen  800 mg Oral Once   Continuous Infusions:    LABS / RADIOLOGY:   Labs Reviewed - No data to display Dg Lumbar Spine Complete  05/31/2011  *RADIOLOGY REPORT*  Clinical Data: Worsening of lumbar back pain.  LUMBAR SPINE - COMPLETE 4+ VIEW  Comparison: None.  Findings: No evidence of acute fracture, spondylolysis, or spondylolisthesis.  Generalized osteopenia noted.  Severe degenerative disc disease is seen at L5-S1.  Moderate to severe facet DJD is seen bilaterally at L4-5 and L5-S1.  IMPRESSION:  1.  No acute findings. 2.  Severe lower lumbar degenerative spondylosis. 3.  Osteopenia.  Original Report Authenticated By: Marlaine Hind, M.D.        IMPRESSION: 1. Lumbar pain       MDM  Low back pain with no radicular component. X-ray normal. Discharge home with Naprosyn and Flexeril   I personally performed the services described in this documentation, which was scribed in my presence. The recorded information has been reviewed and considered.       Nat Christen, MD 05/31/11 [phone]

## 2011-05-31 NOTE — ED Notes (Signed)
Pain for 3 months, states her pain increased today, history of fibromyalgia

## 2011-05-31 NOTE — ED Notes (Signed)
C/o generalized pain over body, states it started 3 months ago and today is primarily in her back

## 2011-05-31 NOTE — ED Notes (Signed)
Patient was served lunch.  Patient sitting up comfortably in bed eating.

## 2011-07-02 DIAGNOSIS — I2699 Other pulmonary embolism without acute cor pulmonale: Secondary | ICD-10-CM | POA: Diagnosis not present

## 2011-07-02 DIAGNOSIS — T81718A Complication of other artery following a procedure, not elsewhere classified, initial encounter: Secondary | ICD-10-CM | POA: Diagnosis not present

## 2011-07-17 DIAGNOSIS — I2699 Other pulmonary embolism without acute cor pulmonale: Secondary | ICD-10-CM | POA: Diagnosis not present

## 2011-07-26 DIAGNOSIS — J988 Other specified respiratory disorders: Secondary | ICD-10-CM | POA: Diagnosis not present

## 2011-07-26 DIAGNOSIS — J40 Bronchitis, not specified as acute or chronic: Secondary | ICD-10-CM | POA: Diagnosis not present

## 2011-07-30 ENCOUNTER — Other Ambulatory Visit: Payer: Self-pay | Admitting: Cardiology

## 2011-08-05 DIAGNOSIS — J45901 Unspecified asthma with (acute) exacerbation: Secondary | ICD-10-CM | POA: Diagnosis not present

## 2011-08-05 DIAGNOSIS — J209 Acute bronchitis, unspecified: Secondary | ICD-10-CM | POA: Diagnosis not present

## 2011-08-09 ENCOUNTER — Encounter (HOSPITAL_COMMUNITY): Payer: Self-pay

## 2011-08-09 ENCOUNTER — Other Ambulatory Visit: Payer: Self-pay

## 2011-08-09 ENCOUNTER — Inpatient Hospital Stay (HOSPITAL_COMMUNITY)
Admission: EM | Admit: 2011-08-09 | Discharge: 2011-08-12 | DRG: 202 | Disposition: A | Discharge status: Home / Self Care | Payer: Medicare Other | Attending: Internal Medicine | Admitting: Internal Medicine

## 2011-08-09 ENCOUNTER — Emergency Department (HOSPITAL_COMMUNITY): Payer: Medicare Other

## 2011-08-09 DIAGNOSIS — K589 Irritable bowel syndrome without diarrhea: Secondary | ICD-10-CM | POA: Diagnosis present

## 2011-08-09 DIAGNOSIS — T380X5A Adverse effect of glucocorticoids and synthetic analogues, initial encounter: Secondary | ICD-10-CM | POA: Diagnosis present

## 2011-08-09 DIAGNOSIS — J45909 Unspecified asthma, uncomplicated: Secondary | ICD-10-CM | POA: Diagnosis present

## 2011-08-09 DIAGNOSIS — E039 Hypothyroidism, unspecified: Secondary | ICD-10-CM | POA: Diagnosis present

## 2011-08-09 DIAGNOSIS — M353 Polymyalgia rheumatica: Secondary | ICD-10-CM | POA: Diagnosis present

## 2011-08-09 DIAGNOSIS — IMO0002 Reserved for concepts with insufficient information to code with codable children: Secondary | ICD-10-CM | POA: Diagnosis not present

## 2011-08-09 DIAGNOSIS — N179 Acute kidney failure, unspecified: Secondary | ICD-10-CM | POA: Diagnosis present

## 2011-08-09 DIAGNOSIS — K219 Gastro-esophageal reflux disease without esophagitis: Secondary | ICD-10-CM | POA: Diagnosis present

## 2011-08-09 DIAGNOSIS — R059 Cough, unspecified: Secondary | ICD-10-CM | POA: Diagnosis not present

## 2011-08-09 DIAGNOSIS — Z79899 Other long term (current) drug therapy: Secondary | ICD-10-CM | POA: Diagnosis not present

## 2011-08-09 DIAGNOSIS — I2510 Atherosclerotic heart disease of native coronary artery without angina pectoris: Secondary | ICD-10-CM | POA: Diagnosis present

## 2011-08-09 DIAGNOSIS — Z860100 Personal history of colon polyps, unspecified: Secondary | ICD-10-CM

## 2011-08-09 DIAGNOSIS — D72829 Elevated white blood cell count, unspecified: Secondary | ICD-10-CM | POA: Diagnosis present

## 2011-08-09 DIAGNOSIS — F3289 Other specified depressive episodes: Secondary | ICD-10-CM | POA: Diagnosis present

## 2011-08-09 DIAGNOSIS — F329 Major depressive disorder, single episode, unspecified: Secondary | ICD-10-CM | POA: Diagnosis present

## 2011-08-09 DIAGNOSIS — I10 Essential (primary) hypertension: Secondary | ICD-10-CM | POA: Diagnosis not present

## 2011-08-09 DIAGNOSIS — E119 Type 2 diabetes mellitus without complications: Secondary | ICD-10-CM | POA: Diagnosis not present

## 2011-08-09 DIAGNOSIS — J209 Acute bronchitis, unspecified: Secondary | ICD-10-CM | POA: Diagnosis present

## 2011-08-09 DIAGNOSIS — Z86711 Personal history of pulmonary embolism: Secondary | ICD-10-CM

## 2011-08-09 DIAGNOSIS — I482 Chronic atrial fibrillation, unspecified: Secondary | ICD-10-CM | POA: Diagnosis present

## 2011-08-09 DIAGNOSIS — Z888 Allergy status to other drugs, medicaments and biological substances status: Secondary | ICD-10-CM

## 2011-08-09 DIAGNOSIS — Z9861 Coronary angioplasty status: Secondary | ICD-10-CM | POA: Diagnosis not present

## 2011-08-09 DIAGNOSIS — E785 Hyperlipidemia, unspecified: Secondary | ICD-10-CM | POA: Diagnosis present

## 2011-08-09 DIAGNOSIS — Z7901 Long term (current) use of anticoagulants: Secondary | ICD-10-CM

## 2011-08-09 DIAGNOSIS — R05 Cough: Secondary | ICD-10-CM | POA: Diagnosis not present

## 2011-08-09 DIAGNOSIS — T81718A Complication of other artery following a procedure, not elsewhere classified, initial encounter: Secondary | ICD-10-CM | POA: Diagnosis not present

## 2011-08-09 DIAGNOSIS — Z87891 Personal history of nicotine dependence: Secondary | ICD-10-CM

## 2011-08-09 DIAGNOSIS — R0602 Shortness of breath: Secondary | ICD-10-CM | POA: Diagnosis not present

## 2011-08-09 DIAGNOSIS — R079 Chest pain, unspecified: Secondary | ICD-10-CM | POA: Diagnosis not present

## 2011-08-09 DIAGNOSIS — J45901 Unspecified asthma with (acute) exacerbation: Principal | ICD-10-CM | POA: Diagnosis present

## 2011-08-09 DIAGNOSIS — J9600 Acute respiratory failure, unspecified whether with hypoxia or hypercapnia: Secondary | ICD-10-CM | POA: Diagnosis present

## 2011-08-09 DIAGNOSIS — I4891 Unspecified atrial fibrillation: Secondary | ICD-10-CM | POA: Diagnosis present

## 2011-08-09 DIAGNOSIS — D509 Iron deficiency anemia, unspecified: Secondary | ICD-10-CM | POA: Diagnosis present

## 2011-08-09 DIAGNOSIS — I2699 Other pulmonary embolism without acute cor pulmonale: Secondary | ICD-10-CM | POA: Diagnosis present

## 2011-08-09 DIAGNOSIS — R7989 Other specified abnormal findings of blood chemistry: Secondary | ICD-10-CM | POA: Diagnosis present

## 2011-08-09 DIAGNOSIS — Z86718 Personal history of other venous thrombosis and embolism: Secondary | ICD-10-CM | POA: Diagnosis not present

## 2011-08-09 DIAGNOSIS — D649 Anemia, unspecified: Secondary | ICD-10-CM | POA: Diagnosis not present

## 2011-08-09 HISTORY — DX: Atherosclerotic heart disease of native coronary artery without angina pectoris: I25.10

## 2011-08-09 LAB — DIFFERENTIAL
Basophils Absolute: 0 10*3/uL (ref 0.0–0.1)
Basophils Relative: 0 % (ref 0–1)
Lymphocytes Relative: 17 % (ref 12–46)
Neutro Abs: 11.4 10*3/uL — ABNORMAL HIGH (ref 1.7–7.7)
Neutrophils Relative %: 75 % (ref 43–77)

## 2011-08-09 LAB — COMPREHENSIVE METABOLIC PANEL
ALT: 21 U/L (ref 0–35)
AST: 15 U/L (ref 0–37)
Albumin: 3 g/dL — ABNORMAL LOW (ref 3.5–5.2)
Alkaline Phosphatase: 66 U/L (ref 39–117)
CO2: 31 mEq/L (ref 19–32)
Chloride: 103 mEq/L (ref 96–112)
Potassium: 3.6 mEq/L (ref 3.5–5.1)
Sodium: 143 mEq/L (ref 135–145)
Total Bilirubin: 0.2 mg/dL — ABNORMAL LOW (ref 0.3–1.2)

## 2011-08-09 LAB — CBC
Hemoglobin: 10.3 g/dL — ABNORMAL LOW (ref 12.0–15.0)
MCHC: 30.7 g/dL (ref 30.0–36.0)
RDW: 19 % — ABNORMAL HIGH (ref 11.5–15.5)
WBC: 15.4 10*3/uL — ABNORMAL HIGH (ref 4.0–10.5)

## 2011-08-09 LAB — APTT: aPTT: 46 seconds — ABNORMAL HIGH (ref 24–37)

## 2011-08-09 LAB — POCT I-STAT TROPONIN I: Troponin i, poc: 0.01 ng/mL (ref 0.00–0.08)

## 2011-08-09 MED ORDER — ACETAMINOPHEN 500 MG PO TABS: 500.0000 mg | ORAL_TABLET | ORAL | Status: DC | PRN

## 2011-08-09 MED ORDER — METHYLPREDNISOLONE SODIUM SUCC 125 MG IJ SOLR
80.0000 mg | Freq: Four times a day (QID) | INTRAMUSCULAR | Status: DC
  Administered 2011-08-09 – 2011-08-11 (×7): 80 mg via INTRAVENOUS
  Filled 2011-08-09 (×7): qty 2

## 2011-08-09 MED ORDER — MOXIFLOXACIN HCL IN NACL 400 MG/250ML IV SOLN
400.0000 mg | INTRAVENOUS | Status: DC
  Administered 2011-08-09 – 2011-08-12 (×3): 400 mg via INTRAVENOUS
  Filled 2011-08-09 (×4): qty 250

## 2011-08-09 MED ORDER — OXYCODONE HCL 5 MG PO TABS
5.0000 mg | ORAL_TABLET | ORAL | Status: DC | PRN
  Administered 2011-08-10 (×2): 5 mg via ORAL
  Filled 2011-08-09 (×2): qty 1

## 2011-08-09 MED ORDER — ALBUTEROL SULFATE (5 MG/ML) 0.5% IN NEBU
5.0000 mg | INHALATION_SOLUTION | Freq: Once | RESPIRATORY_TRACT | Status: AC
  Administered 2011-08-09: 5 mg via RESPIRATORY_TRACT
  Filled 2011-08-09: qty 1

## 2011-08-09 MED ORDER — OLMESARTAN MEDOXOMIL 20 MG PO TABS
40.0000 mg | ORAL_TABLET | Freq: Every day | ORAL | Status: DC
  Administered 2011-08-10 – 2011-08-12 (×3): 40 mg via ORAL
  Filled 2011-08-09 (×3): qty 2

## 2011-08-09 MED ORDER — GLYBURIDE 5 MG PO TABS
2.5000 mg | ORAL_TABLET | Freq: Every day | ORAL | Status: DC
  Administered 2011-08-10 – 2011-08-12 (×3): 2.5 mg via ORAL
  Filled 2011-08-09 (×3): qty 1

## 2011-08-09 MED ORDER — IPRATROPIUM BROMIDE 0.02 % IN SOLN
0.5000 mg | Freq: Once | RESPIRATORY_TRACT | Status: AC
  Administered 2011-08-09: 0.5 mg via RESPIRATORY_TRACT
  Filled 2011-08-09: qty 2.5

## 2011-08-09 MED ORDER — ONDANSETRON HCL 4 MG PO TABS: 4.0000 mg | ORAL_TABLET | Freq: Four times a day (QID) | ORAL | Status: DC | PRN

## 2011-08-09 MED ORDER — SODIUM CHLORIDE 0.9 % IV SOLN: INTRAVENOUS | Status: DC

## 2011-08-09 MED ORDER — DOCUSATE SODIUM 100 MG PO CAPS
100.0000 mg | ORAL_CAPSULE | Freq: Two times a day (BID) | ORAL | Status: DC
  Filled 2011-08-09 (×5): qty 1

## 2011-08-09 MED ORDER — LEVOTHYROXINE SODIUM 50 MCG PO TABS
50.0000 ug | ORAL_TABLET | Freq: Every evening | ORAL | Status: DC
  Administered 2011-08-10 – 2011-08-11 (×2): 50 ug via ORAL
  Filled 2011-08-09 (×2): qty 1

## 2011-08-09 MED ORDER — PANTOPRAZOLE SODIUM 40 MG PO TBEC
40.0000 mg | DELAYED_RELEASE_TABLET | Freq: Every day | ORAL | Status: DC
  Administered 2011-08-10 – 2011-08-12 (×3): 40 mg via ORAL
  Filled 2011-08-09 (×3): qty 1

## 2011-08-09 MED ORDER — CHLORZOXAZONE 500 MG PO TABS
500.0000 mg | ORAL_TABLET | Freq: Three times a day (TID) | ORAL | Status: DC | PRN
  Filled 2011-08-09: qty 1

## 2011-08-09 MED ORDER — METHYLPREDNISOLONE SODIUM SUCC 125 MG IJ SOLR: 80.0000 mg | Freq: Four times a day (QID) | INTRAMUSCULAR | Status: DC

## 2011-08-09 MED ORDER — LEVALBUTEROL HCL 0.63 MG/3ML IN NEBU
0.6300 mg | INHALATION_SOLUTION | RESPIRATORY_TRACT | Status: DC | PRN
  Filled 2011-08-09 (×3): qty 3

## 2011-08-09 MED ORDER — FLUTICASONE PROPIONATE HFA 44 MCG/ACT IN AERO
2.0000 | INHALATION_SPRAY | Freq: Every day | RESPIRATORY_TRACT | Status: DC
Start: 2011-08-09 — End: 2011-08-10
  Administered 2011-08-10: 2 via RESPIRATORY_TRACT
  Filled 2011-08-09: qty 10.6

## 2011-08-09 MED ORDER — INSULIN ASPART 100 UNIT/ML ~~LOC~~ SOLN
0.0000 [IU] | Freq: Every day | SUBCUTANEOUS | Status: DC
  Filled 2011-08-09: qty 3

## 2011-08-09 MED ORDER — SODIUM CHLORIDE 0.9 % IV SOLN
INTRAVENOUS | Status: DC
  Administered 2011-08-09: 17:00:00 via INTRAVENOUS

## 2011-08-09 MED ORDER — SIMVASTATIN 20 MG PO TABS: 40.0000 mg | ORAL_TABLET | Freq: Every day | ORAL | Status: DC

## 2011-08-09 MED ORDER — SODIUM CHLORIDE 0.9 % IJ SOLN
3.0000 mL | Freq: Two times a day (BID) | INTRAMUSCULAR | Status: DC
  Administered 2011-08-09 – 2011-08-12 (×7): 3 mL via INTRAVENOUS
  Filled 2011-08-09 (×7): qty 3

## 2011-08-09 MED ORDER — INSULIN ASPART 100 UNIT/ML ~~LOC~~ SOLN
0.0000 [IU] | Freq: Three times a day (TID) | SUBCUTANEOUS | Status: DC
  Administered 2011-08-10 (×2): 4 [IU] via SUBCUTANEOUS
  Administered 2011-08-11 (×2): 3 [IU] via SUBCUTANEOUS
  Administered 2011-08-12: 4 [IU] via SUBCUTANEOUS
  Administered 2011-08-12: 3 [IU] via SUBCUTANEOUS

## 2011-08-09 MED ORDER — SODIUM CHLORIDE 0.45 % IV SOLN
INTRAVENOUS | Status: DC
  Administered 2011-08-09: 23:00:00 via INTRAVENOUS

## 2011-08-09 MED ORDER — LEVALBUTEROL HCL 0.63 MG/3ML IN NEBU
0.6300 mg | INHALATION_SOLUTION | RESPIRATORY_TRACT | Status: DC
  Administered 2011-08-10: 0.63 mg via RESPIRATORY_TRACT
  Filled 2011-08-09: qty 3

## 2011-08-09 MED ORDER — ONDANSETRON HCL 4 MG/2ML IJ SOLN: 4.0000 mg | Freq: Four times a day (QID) | INTRAMUSCULAR | Status: DC | PRN

## 2011-08-09 MED ORDER — IPRATROPIUM BROMIDE 0.02 % IN SOLN
0.5000 mg | RESPIRATORY_TRACT | Status: DC
  Administered 2011-08-10 – 2011-08-12 (×13): 0.5 mg via RESPIRATORY_TRACT
  Filled 2011-08-09 (×14): qty 2.5

## 2011-08-09 MED ORDER — MOXIFLOXACIN HCL IN NACL 400 MG/250ML IV SOLN
INTRAVENOUS | Status: AC
  Filled 2011-08-09: qty 250

## 2011-08-09 MED ORDER — CITALOPRAM HYDROBROMIDE 20 MG PO TABS
40.0000 mg | ORAL_TABLET | Freq: Every evening | ORAL | Status: DC
  Administered 2011-08-10 – 2011-08-11 (×2): 40 mg via ORAL
  Filled 2011-08-09 (×2): qty 2

## 2011-08-09 MED ORDER — DILTIAZEM HCL ER COATED BEADS 180 MG PO CP24
300.0000 mg | ORAL_CAPSULE | Freq: Every day | ORAL | Status: DC
  Administered 2011-08-10 – 2011-08-12 (×3): 300 mg via ORAL
  Filled 2011-08-09 (×4): qty 1

## 2011-08-09 MED ORDER — ALBUTEROL SULFATE (5 MG/ML) 0.5% IN NEBU: 5.0000 mg | INHALATION_SOLUTION | RESPIRATORY_TRACT | Status: DC

## 2011-08-09 NOTE — ED Notes (Signed)
Pt presents with SOB and wheezing x 2 1/2 weeks. Pt also c/o chest pain.

## 2011-08-09 NOTE — ED Notes (Signed)
Patient to bathroom via wheelchair.  Patient was removed from oxygen; patient O2 sats decreased to 88% while not being on oxygen.  Patient placed on O2 @ 2L via [place].

## 2011-08-09 NOTE — ED Notes (Signed)
Report given to Judson Roch, RN on 300.  Patient to be admitted to room 337; med-surg bed per Dr. [place] Pink.

## 2011-08-09 NOTE — ED Notes (Signed)
Paged respiratory 

## 2011-08-09 NOTE — ED Provider Notes (Signed)
History     CSN: [id number]  Arrival date & time 08/09/11  1428   First MD Initiated Contact with Patient 08/09/11 1443      Chief Complaint  Patient presents with  . Shortness of Breath  . Asthma    (Consider location/radiation/quality/duration/timing/severity/associated sxs/prior treatment) HPI  Patient relates she's had bronchitis for the past 2-1/2 weeks. She's been seen by her Dr. 3-4 times in the last time was 4 days ago. She states she's been on a Z-Pak, Biaxin, and is currently on Levaquin and has had 4 doses. She also relates she's on 15 mg of prednisone a day. She denies fever but states she's having a cough with some mild sputum production that is frothy and white. She has a central chest pain that hurts when she deep breathes or coughs. She states it is achy in nature. She denies any swelling of her extremities. She relates she has a lot of wheezing that is made worse with exercise as is her shortness of breath. She states when she uses her nebulizer she feels better for short period of time and when she starts walking.  She states this pain is different from when she had her cardiac disease or her pulmonary embolus.  ECP Dr. Wolfgang Phoenix  Past Medical History  Diagnosis Date  . Paroxysmal atrial fibrillation     normal LV function; episodes occurred in 205 and 09/2007  . ASCVD (arteriosclerotic cardiovascular disease)     stent to mid and proximal left anterior descending in 06/2002;drug eluting stent placed in the second diagnol in 08/2003 after  A  non-st elevation myocardial infarction   . Tobacco user     stopped   . Hyperlipidemia     pulmonary embolism 2000 and 09/2008  . Hypertension   . Pulmonary embolism     2000/09/2008  . Thyroid disease     hypothyroidism  . Allergy history unknown   . GERD (gastroesophageal reflux disease)   . Diabetes mellitus     no insulin  . Asthma   . FH: colonic polyps     adenomataous  . Anticoagulated   . Diarrhea     acute  .  Rectal bleeding   . Pedal edema   . Irritable bowel syndrome     Past Surgical History  Procedure Date  . Cleft palate repair     4 surgeries  . Abdominal hysterectomy   . Appendectomy   . Cholecystectomy   . Heel spur surgery   . Knee surgery   . Breast reduction surgery   . Colonoscopy 2011    negative    Family History  Problem Relation Age of Onset  . Heart attack Mother   . Heart attack Father     History  Substance Use Topics  . Smoking status: Former Research scientist (life sciences)  . Smokeless tobacco: Never Used  . Alcohol Use: No   lives alone But smoking over 30 years ago  OB History    Grav Para Term Preterm Abortions TAB SAB Ect Mult Living                  Review of Systems  All other systems reviewed and are negative.    Allergies  Demerol  Home Medications   Current Outpatient Rx  Name Route Sig Dispense Refill  . ALBUTEROL SULFATE HFA 108 (90 BASE) MCG/ACT IN AERS Inhalation Inhale 2 puffs into the lungs every 4 (four) hours as needed. For shortness of breath     .  CARDIZEM CD 300 MG PO CP24  TAKE 1 CAPSULE BY MOUTH ONCE A DAY. 30 each 6  . CHLORZOXAZONE 500 MG PO TABS Oral Take 500 mg by mouth 3 (three) times daily as needed. Muscle spasms     . VITAMIN D3 2000 UNITS PO TABS Oral Take 1 tablet by mouth daily.      Marland Kitchen CITALOPRAM HYDROBROMIDE 20 MG PO TABS Oral Take 40 mg by mouth at bedtime.      Marland Kitchen DILTIAZEM HCL ER COATED BEADS 300 MG PO CP24      . FLUTICASONE PROPIONATE  HFA 44 MCG/ACT IN AERO Inhalation Inhale 2 puffs into the lungs at bedtime.     . FUROSEMIDE 40 MG PO TABS Oral Take 40 mg by mouth daily. Sometimes patient takes 2 tabs a day     . GLYBURIDE 5 MG PO TABS Oral Take 5 mg by mouth daily with breakfast.      . K-DUR 20 MEQ PO TBCR  TAKE (1) TABLET TWICE DAILY. 60 each 12  . LEVOTHYROXINE SODIUM 50 MCG PO CAPS Oral Take 1 capsule by mouth at bedtime.     Marland Kitchen MONTELUKAST SODIUM 10 MG PO TABS Oral Take 10 mg by mouth at bedtime.      Marland Kitchen NAPROXEN 500 MG  PO TABS Oral Take 1 tablet (500 mg total) by mouth 2 (two) times daily. 30 tablet 0  . NITROGLYCERIN 0.4 MG SL SUBL Sublingual Place 1 tablet (0.4 mg total) under the tongue every 5 (five) minutes as needed for chest pain. 90 tablet 0  . OMEPRAZOLE 20 MG PO CPDR Oral Take 20 mg by mouth 2 (two) times daily.      . OXYCODONE-ACETAMINOPHEN 5-325 MG PO TABS Oral Take 0.5-1 tablets by mouth every 6 (six) hours as needed. pain     . PRAVASTATIN SODIUM 80 MG PO TABS Oral Take 80 mg by mouth daily.      Marland Kitchen PREDNISONE 5 MG PO TABS Oral Take 15 mg by mouth daily.      Marland Kitchen VALSARTAN 320 MG PO TABS  320 mg every morning.     . WARFARIN SODIUM 5 MG PO TABS Oral Take 5 mg by mouth every evening. Only takes 0.5 tablet on Tuesday to equal dose of 2.5mg , all other days dose is 5mg       BP 153/80  Pulse 93  Resp 30  Ht 5\' 3"  (1.6 m)  Wt 260 lb (117.935 kg)  BMI 46.06 kg/m2  SpO2 94%  Vital signs normal except tachypnea and mild hypertension   Physical Exam  Nursing note and vitals reviewed. Constitutional: She is oriented to person, place, and time. She appears well-developed and well-nourished.  Non-toxic appearance. She does not appear ill. She appears distressed.       Obese  HENT:  Head: Normocephalic and atraumatic.  Right Ear: External ear normal.  Left Ear: External ear normal.  Nose: Nose normal. No mucosal edema or rhinorrhea.  Mouth/Throat: Oropharynx is clear and moist and mucous membranes are normal. No dental abscesses or uvula swelling.  Eyes: Conjunctivae and EOM are normal. Pupils are equal, round, and reactive to light.  Neck: Normal range of motion and full passive range of motion without pain. Neck supple.  Cardiovascular: Normal rate, regular rhythm and normal heart sounds.  Exam reveals no gallop and no friction rub.   No murmur heard. Pulmonary/Chest: She is in respiratory distress. She has wheezes. She has no rhonchi. She has no rales.  She exhibits no tenderness and no crepitus.        Patient has diffusely diminished breath sounds she has an expiratory wheezing, she appears to get neck when she talks. She has mild retractions.  Abdominal: Soft. Normal appearance and bowel sounds are normal. She exhibits no distension. There is no tenderness. There is no rebound and no guarding.  Musculoskeletal: Normal range of motion. She exhibits no edema and no tenderness.       Moves all extremities well.   Neurological: She is alert and oriented to person, place, and time. She has normal strength. No cranial nerve deficit.  Skin: Skin is warm, dry and intact. No rash noted. No erythema. No pallor.  Psychiatric: She has a normal mood and affect. Her speech is normal and behavior is normal. Her mood appears not anxious.    ED Course  Procedures (including critical care time)  Patient received albuterol/Atrovent nebulizer. She was rechecked at 1630 and she still has some expiratory wheezing however she does have mild improved air movement. I'm going to repeat her nebulizer. Patient is waiting to go to x-ray for her chest x-ray.  17:24 Dr Conley Canal admit to med-surg   Results for orders placed during the hospital encounter of 08/09/11  CBC      Component Value Range   WBC 15.4 (*) 4.0 - 10.5 (K/uL)   RBC 4.45  3.87 - 5.11 (MIL/uL)   Hemoglobin 10.3 (*) 12.0 - 15.0 (g/dL)   HCT 33.6 (*) 36.0 - 46.0 (%)   MCV 75.5 (*) 78.0 - 100.0 (fL)   MCH 23.1 (*) 26.0 - 34.0 (pg)   MCHC 30.7  30.0 - 36.0 (g/dL)   RDW 19.0 (*) 11.5 - 15.5 (%)   Platelets 350  150 - 400 (K/uL)  DIFFERENTIAL      Component Value Range   Neutrophils Relative 75  43 - 77 (%)   Neutro Abs 11.4 (*) 1.7 - 7.7 (K/uL)   Lymphocytes Relative 17  12 - 46 (%)   Lymphs Abs 2.6  0.7 - 4.0 (K/uL)   Monocytes Relative 8  3 - 12 (%)   Monocytes Absolute 1.2 (*) 0.1 - 1.0 (K/uL)   Eosinophils Relative 0  0 - 5 (%)   Eosinophils Absolute 0.1  0.0 - 0.7 (K/uL)   Basophils Relative 0  0 - 1 (%)   Basophils Absolute 0.0   0.0 - 0.1 (K/uL)  COMPREHENSIVE METABOLIC PANEL      Component Value Range   Sodium 143  135 - 145 (mEq/L)   Potassium 3.6  3.5 - 5.1 (mEq/L)   Chloride 103  96 - 112 (mEq/L)   CO2 31  19 - 32 (mEq/L)   Glucose, Bld 132 (*) 70 - 99 (mg/dL)   BUN 21  6 - 23 (mg/dL)   Creatinine, Ser 1.34 (*) 0.50 - 1.10 (mg/dL)   Calcium 10.0  8.4 - 10.5 (mg/dL)   Total Protein 6.4  6.0 - 8.3 (g/dL)   Albumin 3.0 (*) 3.5 - 5.2 (g/dL)   AST 15  0 - 37 (U/L)   ALT 21  0 - 35 (U/L)   Alkaline Phosphatase 66  39 - 117 (U/L)   Total Bilirubin 0.2 (*) 0.3 - 1.2 (mg/dL)   GFR calc non Af Amer 39 (*) >90 (mL/min)   GFR calc Af Amer 45 (*) >90 (mL/min)  PRO B NATRIURETIC PEPTIDE      Component Value Range   Pro B Natriuretic peptide (  BNP) 325.2 (*) 0 - 125 (pg/mL)  APTT      Component Value Range   aPTT 46 (*) 24 - 37 (seconds)  PROTIME-INR      Component Value Range   Prothrombin Time 31.6 (*) 11.6 - 15.2 (seconds)   INR 3.00 (*) 0.00 - 1.49   POCT I-STAT TROPONIN I      Component Value Range   Troponin i, poc 0.01  0.00 - 0.08 (ng/mL)   Comment 3            Laboratory interpretation all normal except elevated INR that is over therapeutic, leukocytosis consistent with being on steroids, anemia, elevated creatinine, minimally elevated BNP  Dg Chest 2 View  08/09/2011  *RADIOLOGY REPORT*  Clinical Data: Shortness of breath, cough, history of asthma  CHEST - 2 VIEW  Comparison: Chest x-ray of 05/07/2011  Findings: No active infiltrate or effusion is seen.  Cardiomegaly is stable.  The bones are osteopenic and has been slightly more compression of a mid thoracic vertebral body.  IMPRESSION: None 1.  Stable cardiomegaly.  No active lung disease. 2.  Slightly more compressed mid thoracic vertebral body.  Original Report Authenticated By: Joretta Bachelor, M.D.     Date: 08/09/2011  Rate: 92  Rhythm: normal sinus rhythm  QRS Axis: normal  Intervals: normal  ST/T Wave abnormalities: normal  Conduction  Disutrbances:none  Narrative Interpretation:   Old EKG Reviewed: unchanged from 03/04/2011   Diagnoses that have been ruled out:  None  Diagnoses that are still under consideration:  None  Final diagnoses:  Bronchitis with bronchospasm   Plan admission   Rolland Porter, MD, FACEP    MDM          Janice Norrie, MD 08/09/11 1728

## 2011-08-09 NOTE — H&P (Signed)
[name] is an 71 y.o. female.    PCP: Rubbie Battiest, MD, MD   She is followed by a rheumatologist, Dr. Amil Amen for her polymyalgia rheumatica.  She is followed by gastroenterologist Dr. Laural Golden for irritable bowel syndrome.  She's followed by cardiologist [name] for her paroxysmal A. fib and her coronary artery disease.    Chief Complaint: Difficulty breathing. For 2 weeks  HPI: This is a 71 year old, Caucasian female, who is obese, who has a history of asthma, polymyalgia rheumatica, irritable bowel syndrome, hypertension, coronary artery disease with stent placement, hypothyroidism, diabetes, history of pulmonary embolism on anticoagulation, who was in her usual state of health after about 2 weeks ago, and she started having difficulty breathing with wheezing. She went to see her primary care physician and was prescribed a Z-Pak and was asked to increase the dose of her prednisone. She did not feel any better and then went back to her doctor, who prescribed Biaxin and, again, asked her to increase the dose of her prednisone. Then last Friday she again went to her Dr. and was given an injection of an antibiotic and was told to take her increasing dose of prednisone. She did not feel any better and, so, she was advised come in to the hospital. She's been taking albuterol nebulizers every 4 hours with no relief. She denies any fever or chills. Has been having wheezing and has experienced some chest discomfort because of the cough and the wheezing. Her cough is some mostly dry. She is mostly short of breath with exertion, and as long as she is lying still she does not feel any difficulty breathing. Denies any nausea, vomiting. Denies any leg swelling.   Home Medications: Prior to Admission medications   Medication Sig Start Date End Date Taking? Authorizing Provider  acetaminophen (TYLENOL) 500 MG tablet Take 500 mg by mouth every 4 (four) hours as needed. For pain. **take with  Oxycodone 5mg **   Yes Historical Provider, MD  albuterol (PROAIR HFA) 108 (90 BASE) MCG/ACT inhaler Inhale 2 puffs into the lungs every 4 (four) hours as needed. For shortness of breath    Yes Historical Provider, MD  albuterol (PROVENTIL) (2.5 MG/3ML) 0.083% nebulizer solution Take 2.5 mg by nebulization every 4 (four) hours as needed. For shortness of breath   Yes Historical Provider, MD  CARDIZEM CD 300 MG 24 hr capsule TAKE 1 CAPSULE BY MOUTH ONCE A DAY. 07/30/11  Yes Cristopher Estimable. Rothbart, MD  chlorzoxazone (PARAFON) 500 MG tablet Take 500 mg by mouth 3 (three) times daily as needed. Muscle spasms    Yes Historical Provider, MD  Cholecalciferol (VITAMIN D3) 2000 UNITS TABS Take 2 tablets by mouth daily.    Yes Historical Provider, MD  citalopram (CELEXA) 20 MG tablet Take 40 mg by mouth every evening.    Yes Historical Provider, MD  fluticasone (FLOVENT HFA) 44 MCG/ACT inhaler Inhale 2 puffs into the lungs at bedtime.    Yes Historical Provider, MD  glyBURIDE (DIABETA) 5 MG tablet Take 2.5 mg by mouth daily with breakfast.    Yes Historical Provider, MD  ipratropium (ATROVENT) 0.02 % nebulizer solution Take 500 mcg by nebulization every 4 (four) hours as needed. For shortness of breath   Yes Historical Provider, MD  Levothyroxine Sodium 50 MCG CAPS Take 1 capsule by mouth every evening.    Yes Historical Provider, MD  montelukast (SINGULAIR) 10 MG tablet Take 10 mg by mouth every evening.    Yes Historical  Provider, MD  omeprazole (PRILOSEC) 20 MG capsule Take 20 mg by mouth every morning.    Yes Historical Provider, MD  oxycodone (OXY-IR) 5 MG capsule Take 5 mg by mouth every 4 (four) hours as needed. For pain. **To take Tylenol OTC with dose**   Yes Historical Provider, MD  pravastatin (PRAVACHOL) 80 MG tablet Take 80 mg by mouth every evening.    Yes Historical Provider, MD  predniSONE (DELTASONE) 5 MG tablet Take 15 mg by mouth every morning.    Yes Historical Provider, MD  valsartan (DIOVAN) 320  MG tablet 320 mg every morning.  11/26/10  Yes Cristopher Estimable. Rothbart, MD  warfarin (COUMADIN) 5 MG tablet Take 5 mg by mouth every evening. Only takes 0.5 tablet on Tuesday to equal dose of 2.5mg , all other days dose is 5mg . **Takes at 6pm every evening**   Yes Historical Provider, MD  nitroGLYCERIN (NITROSTAT) 0.4 MG SL tablet Place 1 tablet (0.4 mg total) under the tongue every 5 (five) minutes as needed for chest pain. 03/07/11 03/06/12  Lelon Frohlich, MD    Allergies:  Allergies  Allergen Reactions  . Demerol Nausea And Vomiting    Past Medical History: Past Medical History  Diagnosis Date  . ASCVD (arteriosclerotic cardiovascular disease)     stent to mid and proximal left anterior descending in 06/2002;drug eluting stent placed in the second diagnol in 08/2003 after  A  non-st elevation myocardial infarction   . Tobacco user     stopped   . Hyperlipidemia     pulmonary embolism 2000 and 09/2008  . Hypertension   . Pulmonary embolism     2000/09/2008  . Thyroid disease     hypothyroidism  . Allergy history unknown   . GERD (gastroesophageal reflux disease)   . Diabetes mellitus     no insulin  . Asthma   . FH: colonic polyps     adenomataous  . Anticoagulated   . Diarrhea     acute  . Rectal bleeding   . Pedal edema   . Irritable bowel syndrome   . Coronary artery disease   . Paroxysmal atrial fibrillation     normal LV function; episodes occurred in 205 and 09/2007    Past Surgical History  Procedure Date  . Cleft palate repair     4 surgeries  . Abdominal hysterectomy   . Appendectomy   . Cholecystectomy   . Heel spur surgery   . Knee surgery   . Breast reduction surgery   . Colonoscopy 2011    negative    Social History:  reports that she has quit smoking. She has never used smokeless tobacco. She reports that she does not drink alcohol or use illicit drugs.  Family History:  Family History  Problem Relation Age of Onset  . Heart attack Mother   .  Heart attack Father   . Lung cancer Sister   . Lymphoma Brother     Review of Systems - History obtained from the patient General ROS: weakness Psychological ROS: negative Ophthalmic ROS: negative ENT ROS: negative Allergy and Immunology ROS: negative Hematological and Lymphatic ROS: negative Endocrine ROS: negative Respiratory ROS: as in hpi Cardiovascular ROS: as in hpi Gastrointestinal ROS: negative Genito-Urinary ROS: negative Musculoskeletal ROS: negative Neurological ROS: negative Dermatological ROS: negative  Physical Examination Blood pressure 133/41, pulse 80, temperature 97.9 F (36.6 C), temperature source Oral, resp. rate 20, height 5\' 3"  (1.6 m), weight 117.935 kg (260 lb), SpO2 97.00%.  General appearance: alert, cooperative, appears stated age, no distress and morbidly obese Head: Normocephalic, without obvious abnormality, atraumatic Eyes: conjunctivae/corneas clear. PERRL, EOM's intact.  Throat: lips, mucosa, and tongue normal; teeth and gums normal Neck: no adenopathy, no carotid bruit, no JVD, supple, symmetrical, trachea midline and thyroid not enlarged, symmetric, no tenderness/mass/nodules Back: symmetric, no curvature. ROM normal. No CVA tenderness. Resp: Bilateral end expiratory wheezing. No crackles appreciated. Cardio: S1-S2 is normal. Regular. No S3, S4. No rubs, or bruit. Systolic murmur appreciated over the second intercostal space on the right side. No pedal edema is present GI: soft, non-tender; bowel sounds normal; no masses,  no organomegaly Extremities: extremities normal, atraumatic, no cyanosis or edema Pulses: 2+ and symmetric Skin: Skin color, texture, turgor normal. No rashes or lesions Lymph nodes: Cervical, supraclavicular, and axillary nodes normal. Neurologic: Grossly normal  Laboratory Data: Results for orders placed during the hospital encounter of 08/09/11 (from the past 48 hour(s))  CBC     Status: Abnormal   Collection Time    08/09/11  3:00 PM      Component Value Range Comment   WBC 15.4 (*) 4.0 - 10.5 (K/uL)    RBC 4.45  3.87 - 5.11 (MIL/uL)    Hemoglobin 10.3 (*) 12.0 - 15.0 (g/dL)    HCT 33.6 (*) 36.0 - 46.0 (%)    MCV 75.5 (*) 78.0 - 100.0 (fL)    MCH 23.1 (*) 26.0 - 34.0 (pg)    MCHC 30.7  30.0 - 36.0 (g/dL)    RDW 19.0 (*) 11.5 - 15.5 (%)    Platelets 350  150 - 400 (K/uL)   DIFFERENTIAL     Status: Abnormal   Collection Time   08/09/11  3:00 PM      Component Value Range Comment   Neutrophils Relative 75  43 - 77 (%)    Neutro Abs 11.4 (*) 1.7 - 7.7 (K/uL)    Lymphocytes Relative 17  12 - 46 (%)    Lymphs Abs 2.6  0.7 - 4.0 (K/uL)    Monocytes Relative 8  3 - 12 (%)    Monocytes Absolute 1.2 (*) 0.1 - 1.0 (K/uL)    Eosinophils Relative 0  0 - 5 (%)    Eosinophils Absolute 0.1  0.0 - 0.7 (K/uL)    Basophils Relative 0  0 - 1 (%)    Basophils Absolute 0.0  0.0 - 0.1 (K/uL)   COMPREHENSIVE METABOLIC PANEL     Status: Abnormal   Collection Time   08/09/11  3:00 PM      Component Value Range Comment   Sodium 143  135 - 145 (mEq/L)    Potassium 3.6  3.5 - 5.1 (mEq/L)    Chloride 103  96 - 112 (mEq/L)    CO2 31  19 - 32 (mEq/L)    Glucose, Bld 132 (*) 70 - 99 (mg/dL)    BUN 21  6 - 23 (mg/dL)    Creatinine, Ser 1.34 (*) 0.50 - 1.10 (mg/dL)    Calcium 10.0  8.4 - 10.5 (mg/dL)    Total Protein 6.4  6.0 - 8.3 (g/dL)    Albumin 3.0 (*) 3.5 - 5.2 (g/dL)    AST 15  0 - 37 (U/L)    ALT 21  0 - 35 (U/L)    Alkaline Phosphatase 66  39 - 117 (U/L)    Total Bilirubin 0.2 (*) 0.3 - 1.2 (mg/dL)    GFR calc non Af Amer 39 (*) >  90 (mL/min)    GFR calc Af Amer 45 (*) >90 (mL/min)   APTT     Status: Abnormal   Collection Time   08/09/11  3:00 PM      Component Value Range Comment   aPTT 46 (*) 24 - 37 (seconds)   PROTIME-INR     Status: Abnormal   Collection Time   08/09/11  3:00 PM      Component Value Range Comment   Prothrombin Time 31.6 (*) 11.6 - 15.2 (seconds)    INR 3.00 (*) 0.00 - 1.49    PRO B  NATRIURETIC PEPTIDE     Status: Abnormal   Collection Time   08/09/11  3:01 PM      Component Value Range Comment   Pro B Natriuretic peptide (BNP) 325.2 (*) 0 - 125 (pg/mL)   POCT I-STAT TROPONIN I     Status: Normal   Collection Time   08/09/11  3:07 PM      Component Value Range Comment   Troponin i, poc 0.01  0.00 - 0.08 (ng/mL)    Comment 3              Radiology Reports: Dg Chest 2 View  08/09/2011  *RADIOLOGY REPORT*  Clinical Data: Shortness of breath, cough, history of asthma  CHEST - 2 VIEW  Comparison: Chest x-ray of 05/07/2011  Findings: No active infiltrate or effusion is seen.  Cardiomegaly is stable.  The bones are osteopenic and has been slightly more compression of a mid thoracic vertebral body.  IMPRESSION: None 1.  Stable cardiomegaly.  No active lung disease. 2.  Slightly more compressed mid thoracic vertebral body.  Original Report Authenticated By: Joretta Bachelor, M.D.    Electrocardiogram: Shows sinus rhythm at 92 beats per minute. Some evidence for left axis deviation. No definite Q waves. No concerning ST or T-wave changes. There is poor R wave progression. No older EKG available for comparison.  Assessment/Plan  Principal Problem:  *Acute bronchitis Active Problems:  HYPOTHYROIDISM  DM  HYPERTENSION  CORONARY ARTERY DISEASE  PULMONARY EMBOLISM  FIBRILLATION, ATRIAL  ASTHMA  GERD  Polymyalgia rheumatica  Acute respiratory failure  Elevated serum creatinine   #1 acute bronchitis with acute respiratory failure: She has failed outpatient treatment. She'll be given high dose intravenous steroids. She will be given nebulizer treatments, antibiotics. Peak flows will be checked. She is on chronic steroids at home. She is somewhat better with nebs in the ED and will be safe for a regular floor.  #2 elevated creatinine: Will give her gentle IV hydration and recheck her labs tomorrow morning.  #3 history of hypertension. Continue with current anti hypertensive  agents.  #4 history of paroxysmal A. fib: She is currently in sinus rhythm. Continue with Cardizem.  #5 history of Pulmonary embolism in 2010. Continue with warfarin for now.  #6 she has leukocytosis, presumably from steroid use. She has mild anemia, which we'll monitor closely  Rest of her medical issues appear to be stable.  DVT, prophylaxis: She is already on warfarin.  She is a full code.  Further management decisions will depend on results of further testing and patient's response to treatment  [facility] Pager [phone]  08/09/2011, 9:09 PM

## 2011-08-09 NOTE — ED Notes (Signed)
Paged RT.

## 2011-08-09 NOTE — Progress Notes (Signed)
ANTICOAGULATION CONSULT NOTE - Initial Consult  Pharmacy Consult for Warfarin Indication: pulmonary embolus History and Afib  Allergies  Allergen Reactions  . Demerol Nausea And Vomiting    Patient Measurements: Height: 5\' 3"  (160 cm) Weight: 260 lb (117.935 kg) IBW/kg (Calculated) : 52.4   Vital Signs: Temp: 97.1 F (36.2 C) (02/15 2129) Temp src: Oral (02/15 2129) BP: 168/78 mmHg (02/15 2129) Pulse Rate: 86  (02/15 2129)  Labs:  Basename 08/09/11 1500  HGB 10.3*  HCT 33.6*  PLT 350  APTT 46*  LABPROT 31.6*  INR 3.00*  HEPARINUNFRC --  CREATININE 1.34*  CKTOTAL --  CKMB --  TROPONINI --   Estimated Creatinine Clearance: 47.1 ml/min (by C-G formula based on Cr of 1.34).  Medical History: Past Medical History  Diagnosis Date  . ASCVD (arteriosclerotic cardiovascular disease)     stent to mid and proximal left anterior descending in 06/2002;drug eluting stent placed in the second diagnol in 08/2003 after  A  non-st elevation myocardial infarction   . Tobacco user     stopped   . Hyperlipidemia     pulmonary embolism 2000 and 09/2008  . Hypertension   . Pulmonary embolism     2000/09/2008  . Thyroid disease     hypothyroidism  . Allergy history unknown   . GERD (gastroesophageal reflux disease)   . Diabetes mellitus     no insulin  . Asthma   . FH: colonic polyps     adenomataous  . Anticoagulated   . Diarrhea     acute  . Rectal bleeding   . Pedal edema   . Irritable bowel syndrome   . Coronary artery disease   . Paroxysmal atrial fibrillation     normal LV function; episodes occurred in 205 and 09/2007    Medications:  Scheduled:    . albuterol  5 mg Nebulization Once  . albuterol  5 mg Nebulization Once  . citalopram  40 mg Oral QPM  . diltiazem  300 mg Oral Daily  . docusate sodium  100 mg Oral BID  . fluticasone  2 puff Inhalation QHS  . glyBURIDE  2.5 mg Oral Q breakfast  . insulin aspart  0-20 Units Subcutaneous TID WC  . insulin aspart   0-5 Units Subcutaneous QHS  . ipratropium  0.5 mg Nebulization Once  . ipratropium  0.5 mg Nebulization Once  . ipratropium  0.5 mg Nebulization Q4H  . levalbuterol  0.63 mg Nebulization Q4H  . levothyroxine  50 mcg Oral QPM  . methylPREDNISolone (SOLU-MEDROL) injection  80 mg Intravenous Q6H  . moxifloxacin  400 mg Intravenous Q24H  . olmesartan  40 mg Oral Daily  . pantoprazole  40 mg Oral Q1200  . simvastatin  40 mg Oral q1800  . sodium chloride  3 mL Intravenous Q12H  . DISCONTD: albuterol  5 mg Nebulization Q4H  . DISCONTD: methylPREDNISolone (SOLU-MEDROL) injection  80 mg Intravenous Q6H    Assessment: Okay for Protocol  Goal of Therapy:  INR 2-3   Plan:  INR 3.0 on admission. No further Warfarin this evening. Daily PT/INR.  Biagio Quint R 08/09/2011,10:19 PM

## 2011-08-10 DIAGNOSIS — E119 Type 2 diabetes mellitus without complications: Secondary | ICD-10-CM | POA: Diagnosis not present

## 2011-08-10 DIAGNOSIS — D649 Anemia, unspecified: Secondary | ICD-10-CM | POA: Diagnosis not present

## 2011-08-10 DIAGNOSIS — Z86718 Personal history of other venous thrombosis and embolism: Secondary | ICD-10-CM | POA: Diagnosis not present

## 2011-08-10 DIAGNOSIS — N179 Acute kidney failure, unspecified: Secondary | ICD-10-CM | POA: Diagnosis present

## 2011-08-10 DIAGNOSIS — D509 Iron deficiency anemia, unspecified: Secondary | ICD-10-CM | POA: Diagnosis present

## 2011-08-10 DIAGNOSIS — J209 Acute bronchitis, unspecified: Secondary | ICD-10-CM | POA: Diagnosis not present

## 2011-08-10 LAB — PROTIME-INR
INR: 2.62 — ABNORMAL HIGH (ref 0.00–1.49)
Prothrombin Time: 28.4 seconds — ABNORMAL HIGH (ref 11.6–15.2)

## 2011-08-10 LAB — GLUCOSE, CAPILLARY
Glucose-Capillary: 176 mg/dL — ABNORMAL HIGH (ref 70–99)
Glucose-Capillary: 116 mg/dL — ABNORMAL HIGH (ref 70–99)
Glucose-Capillary: 135 mg/dL — ABNORMAL HIGH (ref 70–99)

## 2011-08-10 LAB — IRON AND TIBC
Iron: 32 ug/dL — ABNORMAL LOW (ref 42–135)
Saturation Ratios: 10 % — ABNORMAL LOW (ref 20–55)
TIBC: 329 ug/dL (ref 250–470)

## 2011-08-10 LAB — RETICULOCYTES: Retic Ct Pct: 1.3 % (ref 0.4–3.1)

## 2011-08-10 LAB — CBC
HCT: 33.4 % — ABNORMAL LOW (ref 36.0–46.0)
MCH: 22.4 pg — ABNORMAL LOW (ref 26.0–34.0)
MCV: 74.1 fL — ABNORMAL LOW (ref 78.0–100.0)
RBC: 4.51 MIL/uL (ref 3.87–5.11)
WBC: 13.5 10*3/uL — ABNORMAL HIGH (ref 4.0–10.5)

## 2011-08-10 LAB — COMPREHENSIVE METABOLIC PANEL
BUN: 20 mg/dL (ref 6–23)
CO2: 27 mEq/L (ref 19–32)
Calcium: 9.6 mg/dL (ref 8.4–10.5)
Chloride: 100 mEq/L (ref 96–112)
Creatinine, Ser: 0.9 mg/dL (ref 0.50–1.10)
GFR calc Af Amer: 72 mL/min — ABNORMAL LOW (ref >90)
GFR calc non Af Amer: 62 mL/min — ABNORMAL LOW (ref >90)
Total Bilirubin: 0.2 mg/dL — ABNORMAL LOW (ref 0.3–1.2)

## 2011-08-10 LAB — FERRITIN: Ferritin: 20 ng/mL (ref 10–291)

## 2011-08-10 LAB — HEMOGLOBIN A1C: Hgb A1c MFr Bld: 6.6 % — ABNORMAL HIGH (ref <5.7)

## 2011-08-10 LAB — VITAMIN B12: Vitamin B-12: 544 pg/mL (ref 211–911)

## 2011-08-10 LAB — TSH: TSH: 1.211 u[IU]/mL (ref 0.350–4.500)

## 2011-08-10 MED ORDER — WARFARIN SODIUM 5 MG PO TABS
5.0000 mg | ORAL_TABLET | Freq: Once | ORAL | Status: AC
  Administered 2011-08-10: 5 mg via ORAL
  Filled 2011-08-10: qty 1

## 2011-08-10 MED ORDER — LEVALBUTEROL HCL 0.63 MG/3ML IN NEBU
0.6300 mg | INHALATION_SOLUTION | RESPIRATORY_TRACT | Status: DC
  Administered 2011-08-10 – 2011-08-12 (×12): 0.63 mg via RESPIRATORY_TRACT
  Filled 2011-08-10 (×10): qty 3

## 2011-08-10 MED ORDER — ROSUVASTATIN CALCIUM 20 MG PO TABS
10.0000 mg | ORAL_TABLET | Freq: Every day | ORAL | Status: DC
  Administered 2011-08-10: 18:00:00 via ORAL
  Administered 2011-08-11: 10 mg via ORAL
  Filled 2011-08-10 (×2): qty 1

## 2011-08-10 MED ORDER — FLUTICASONE PROPIONATE HFA 44 MCG/ACT IN AERO
2.0000 | INHALATION_SPRAY | Freq: Two times a day (BID) | RESPIRATORY_TRACT | Status: DC
  Administered 2011-08-10 – 2011-08-12 (×5): 2 via RESPIRATORY_TRACT

## 2011-08-10 MED ORDER — INSULIN GLARGINE 100 UNIT/ML ~~LOC~~ SOLN
15.0000 [IU] | Freq: Every day | SUBCUTANEOUS | Status: DC
  Administered 2011-08-10 – 2011-08-11 (×2): 15 [IU] via SUBCUTANEOUS
  Filled 2011-08-10: qty 3

## 2011-08-10 NOTE — Progress Notes (Signed)
Patient home medication of Levofloxacin 500mg  obtained and sent to pharmacy

## 2011-08-10 NOTE — Progress Notes (Signed)
Subjective:  The patient continues to have chest congestion and wheezing. She may feel a little better this morning.  Objective: Vital signs in last 24 hours: Filed Vitals:   08/10/11 0207 08/10/11 0544 08/10/11 0802 08/10/11 1134  BP:  144/73    Pulse:  74    Temp:  97.7 F (36.5 C)    TempSrc:  Oral    Resp:  20    Height:      Weight:      SpO2: 96% 95% 94% 93%    Intake/Output Summary (Last 24 hours) at 08/10/11 1235 Last data filed at 08/10/11 0500  Gross per 24 hour  Intake    612 ml  Output      0 ml  Net    612 ml    Weight change:   Physical exam: Lungs: Bilateral wheezes and and associated few crackles. Breathing is nonlabored. Heart: S1, S2, with no murmurs rubs or gallops. Abdomen: Obese, positive bowel sounds, nontender, nondistended. Extremities: No pedal edema. Neurologic: She is alert and oriented x3. Cranial nerves II through XII are intact.  Lab Results: Basic Metabolic Panel:  Basename 08/10/11 0654 08/09/11 1500  NA 138 143  K 4.2 3.6  CL 100 103  CO2 27 31  GLUCOSE 184* 132*  BUN 20 21  CREATININE 0.90 1.34*  CALCIUM 9.6 10.0  MG -- --  PHOS -- --   Liver Function Tests:  Basename 08/10/11 0654 08/09/11 1500  AST 14 15  ALT 21 21  ALKPHOS 64 66  BILITOT 0.2* 0.2*  PROT 6.6 6.4  ALBUMIN 3.1* 3.0*   No results found for this basename: LIPASE:2,AMYLASE:2 in the last 72 hours No results found for this basename: AMMONIA:2 in the last 72 hours CBC:  Basename 08/10/11 0654 08/09/11 1500  WBC 13.5* 15.4*  NEUTROABS -- 11.4*  HGB 10.1* 10.3*  HCT 33.4* 33.6*  MCV 74.1* 75.5*  PLT 360 350   Cardiac Enzymes: No results found for this basename: CKTOTAL:3,CKMB:3,CKMBINDEX:3,TROPONINI:3 in the last 72 hours BNP:  Basename 08/09/11 1501  PROBNP 325.2*   D-Dimer: No results found for this basename: DDIMER:2 in the last 72 hours CBG:  Basename 08/10/11 1129 08/10/11 0718 08/09/11 2241  GLUCAP 176* 176* 123*   Hemoglobin  A1C: No results found for this basename: HGBA1C in the last 72 hours Fasting Lipid Panel: No results found for this basename: CHOL,HDL,LDLCALC,TRIG,CHOLHDL,LDLDIRECT in the last 72 hours Thyroid Function Tests: No results found for this basename: TSH,T4TOTAL,FREET4,T3FREE,THYROIDAB in the last 72 hours Anemia Panel:  Basename 08/10/11 0654  VITAMINB12 --  FOLATE --  FERRITIN --  TIBC --  IRON --  RETICCTPCT 1.3   Coagulation:  Basename 08/10/11 0654 08/09/11 1500  LABPROT 28.4* 31.6*  INR 2.62* 3.00*   Urine Drug Screen: Drugs of Abuse  No results found for this basename: labopia,  cocainscrnur,  labbenz,  amphetmu,  thcu,  labbarb    Alcohol Level: No results found for this basename: ETH:2 in the last 72 hours Urinalysis: No results found for this basename: COLORURINE:2,APPERANCEUR:2,LABSPEC:2,PHURINE:2,GLUCOSEU:2,HGBUR:2,BILIRUBINUR:2,KETONESUR:2,PROTEINUR:2,UROBILINOGEN:2,NITRITE:2,LEUKOCYTESUR:2 in the last 72 hours Misc. Labs:   Micro: No results found for this or any previous visit (from the past 240 hour(s)).  Studies/Results: Dg Chest 2 View  08/09/2011  *RADIOLOGY REPORT*  Clinical Data: Shortness of breath, cough, history of asthma  CHEST - 2 VIEW  Comparison: Chest x-ray of 05/07/2011  Findings: No active infiltrate or effusion is seen.  Cardiomegaly is stable.  The bones are osteopenic and  has been slightly more compression of a mid thoracic vertebral body.  IMPRESSION: None 1.  Stable cardiomegaly.  No active lung disease. 2.  Slightly more compressed mid thoracic vertebral body.  Original Report Authenticated By: Joretta Bachelor, M.D.    Medications: I have reviewed the patient's current medications.  Assessment: Principal Problem:  *Acute bronchitis Active Problems:  HYPOTHYROIDISM  DM  HYPERTENSION  CORONARY ARTERY DISEASE  PULMONARY EMBOLISM  FIBRILLATION, ATRIAL  ASTHMA  GERD  Polymyalgia rheumatica  Acute respiratory failure  Elevated serum  creatinine  Acute renal failure  Microcytic anemia   1. Acute bronchitis. She is being treated with as bronchodilators, prednisone, and Avelox.  Acute renal insufficiency. Resolved with gentle IV fluids.  History of PE. She is adequately anticoagulated.  Paroxysmal a fibrillation. Her rate is controlled on diltiazem. Her INR is therapeutic. Hypertension. Currently stable on multiple medications.  Type 2 diabetes mellitus, currently mildly uncontrolled secondary to steroids. She is being treated with glyburide and sliding scale NovoLog. Will add Lantus.  Microcytic anemia. She has a history of colon polyps. She may need another followup evaluation. We'll continue proton pump inhibitor therapy.  Plan:  1. Now that she is hydrated, discontinue IV fluids. Continue other therapies as above. Add Lantus.   LOS: 1 day   Kaye Luoma 08/10/2011, 12:35 PM

## 2011-08-10 NOTE — Progress Notes (Signed)
ANTICOAGULATION CONSULT NOTE Pharmacy Consult for Warfarin Indication: pulmonary embolus History and Afib  Allergies  Allergen Reactions  . Demerol Nausea And Vomiting    Patient Measurements: Height: 5\' 3"  (160 cm) Weight: 260 lb (117.935 kg) IBW/kg (Calculated) : 52.4   Vital Signs: Temp: 97.7 F (36.5 C) (02/16 0544) Temp src: Oral (02/16 0544) BP: 144/73 mmHg (02/16 0544) Pulse Rate: 74  (02/16 0544)  Labs:  Basename 08/10/11 0654 08/09/11 1500  HGB 10.1* 10.3*  HCT 33.4* 33.6*  PLT 360 350  APTT -- 46*  LABPROT 28.4* 31.6*  INR 2.62* 3.00*  HEPARINUNFRC -- --  CREATININE 0.90 1.34*  CKTOTAL -- --  CKMB -- --  TROPONINI -- --   Estimated Creatinine Clearance: 70.1 ml/min (by C-G formula based on Cr of 0.9).  Medical History: Past Medical History  Diagnosis Date  . ASCVD (arteriosclerotic cardiovascular disease)     stent to mid and proximal left anterior descending in 06/2002;drug eluting stent placed in the second diagnol in 08/2003 after  A  non-st elevation myocardial infarction   . Tobacco user     stopped   . Hyperlipidemia     pulmonary embolism 2000 and 09/2008  . Hypertension   . Pulmonary embolism     2000/09/2008  . Thyroid disease     hypothyroidism  . Allergy history unknown   . GERD (gastroesophageal reflux disease)   . Diabetes mellitus     no insulin  . Asthma   . FH: colonic polyps     adenomataous  . Anticoagulated   . Diarrhea     acute  . Rectal bleeding   . Pedal edema   . Irritable bowel syndrome   . Coronary artery disease   . Paroxysmal atrial fibrillation     normal LV function; episodes occurred in 205 and 09/2007    Medications:  Scheduled:     . albuterol  5 mg Nebulization Once  . albuterol  5 mg Nebulization Once  . citalopram  40 mg Oral QPM  . diltiazem  300 mg Oral Daily  . docusate sodium  100 mg Oral BID  . fluticasone  2 puff Inhalation BID  . glyBURIDE  2.5 mg Oral Q breakfast  . insulin aspart  0-20  Units Subcutaneous TID WC  . insulin aspart  0-5 Units Subcutaneous QHS  . ipratropium  0.5 mg Nebulization Once  . ipratropium  0.5 mg Nebulization Once  . ipratropium  0.5 mg Nebulization Q4H  . levalbuterol  0.63 mg Nebulization Q4H  . levothyroxine  50 mcg Oral QPM  . methylPREDNISolone (SOLU-MEDROL) injection  80 mg Intravenous Q6H  . moxifloxacin  400 mg Intravenous Q24H  . olmesartan  40 mg Oral Daily  . pantoprazole  40 mg Oral Q1200  . simvastatin  40 mg Oral q1800  . sodium chloride  3 mL Intravenous Q12H  . DISCONTD: albuterol  5 mg Nebulization Q4H  . DISCONTD: fluticasone  2 puff Inhalation QHS  . DISCONTD: levalbuterol  0.63 mg Nebulization Q4H  . DISCONTD: methylPREDNISolone (SOLU-MEDROL) injection  80 mg Intravenous Q6H    Assessment: Okay for Protocol Home dose = 5mg  daily except 2.5mg  on Tuesdays. Goal of Therapy:  INR 2-3   Plan:  Warfarin 5mg  x 1. Daily PT/INR.  Biagio Quint R 08/10/2011,10:10 AM

## 2011-08-11 DIAGNOSIS — D649 Anemia, unspecified: Secondary | ICD-10-CM | POA: Diagnosis not present

## 2011-08-11 DIAGNOSIS — E119 Type 2 diabetes mellitus without complications: Secondary | ICD-10-CM | POA: Diagnosis not present

## 2011-08-11 DIAGNOSIS — J209 Acute bronchitis, unspecified: Secondary | ICD-10-CM | POA: Diagnosis not present

## 2011-08-11 DIAGNOSIS — Z86718 Personal history of other venous thrombosis and embolism: Secondary | ICD-10-CM | POA: Diagnosis not present

## 2011-08-11 LAB — PROTIME-INR: INR: 2.21 — ABNORMAL HIGH (ref 0.00–1.49)

## 2011-08-11 LAB — CBC
MCHC: 30.3 g/dL (ref 30.0–36.0)
RDW: 19.3 % — ABNORMAL HIGH (ref 11.5–15.5)

## 2011-08-11 LAB — BASIC METABOLIC PANEL
GFR calc Af Amer: 76 mL/min — ABNORMAL LOW (ref >90)
GFR calc non Af Amer: 66 mL/min — ABNORMAL LOW (ref >90)
Potassium: 4.1 mEq/L (ref 3.5–5.1)
Sodium: 137 mEq/L (ref 135–145)

## 2011-08-11 LAB — HEMOGLOBIN A1C: Mean Plasma Glucose: 146 mg/dL — ABNORMAL HIGH (ref <117)

## 2011-08-11 MED ORDER — FERROUS SULFATE 325 (65 FE) MG PO TABS
325.0000 mg | ORAL_TABLET | Freq: Every day | ORAL | Status: DC
  Administered 2011-08-12: 325 mg via ORAL
  Filled 2011-08-11: qty 1

## 2011-08-11 MED ORDER — METHYLPREDNISOLONE SODIUM SUCC 125 MG IJ SOLR
60.0000 mg | Freq: Three times a day (TID) | INTRAMUSCULAR | Status: DC
  Administered 2011-08-11 – 2011-08-12 (×3): 60 mg via INTRAVENOUS
  Filled 2011-08-11 (×3): qty 2

## 2011-08-11 MED ORDER — WARFARIN SODIUM 7.5 MG PO TABS
7.5000 mg | ORAL_TABLET | Freq: Once | ORAL | Status: AC
  Administered 2011-08-11: 7.5 mg via ORAL
  Filled 2011-08-11: qty 1

## 2011-08-11 NOTE — Progress Notes (Signed)
Subjective: The patient says that she is breathing a little bit better today.  Objective: Vital signs in last 24 hours: Filed Vitals:   08/11/11 0542 08/11/11 0748 08/11/11 0750 08/11/11 1111  BP: 167/78     Pulse: 70     Temp: 97.7 F (36.5 C)     TempSrc: Oral     Resp: 20     Height:      Weight:      SpO2:  92% 92% 92%    Intake/Output Summary (Last 24 hours) at 08/11/11 1422 Last data filed at 08/10/11 2336  Gross per 24 hour  Intake    250 ml  Output      0 ml  Net    250 ml    Weight change:   Physical exam: Lungs: Bilateral wheezes and and associated few crackles, less than yesterday.. Breathing is nonlabored. Heart: S1, S2, with no murmurs rubs or gallops. Abdomen: Obese, positive bowel sounds, nontender, nondistended. Extremities: No pedal edema. Neurologic: She is alert and oriented x3. Cranial nerves II through XII are intact.  Lab Results: Basic Metabolic Panel:  Basename 08/11/11 0416 08/10/11 0654  NA 137 138  K 4.1 4.2  CL 102 100  CO2 26 27  GLUCOSE 178* 184*  BUN 27* 20  CREATININE 0.86 0.90  CALCIUM 9.9 9.6  MG -- --  PHOS -- --   Liver Function Tests:  Basename 08/10/11 0654 08/09/11 1500  AST 14 15  ALT 21 21  ALKPHOS 64 66  BILITOT 0.2* 0.2*  PROT 6.6 6.4  ALBUMIN 3.1* 3.0*   No results found for this basename: LIPASE:2,AMYLASE:2 in the last 72 hours No results found for this basename: AMMONIA:2 in the last 72 hours CBC:  Basename 08/11/11 0416 08/10/11 0654 08/09/11 1500  WBC 16.1* 13.5* --  NEUTROABS -- -- 11.4*  HGB 10.0* 10.1* --  HCT 33.0* 33.4* --  MCV 74.2* 74.1* --  PLT 345 360 --   Cardiac Enzymes: No results found for this basename: CKTOTAL:3,CKMB:3,CKMBINDEX:3,TROPONINI:3 in the last 72 hours BNP:  Basename 08/09/11 1501  PROBNP 325.2*   D-Dimer: No results found for this basename: DDIMER:2 in the last 72 hours CBG:  Basename 08/11/11 1058 08/11/11 0736 08/10/11 2058 08/10/11 1608 08/10/11 1129  08/10/11 0718  GLUCAP 139* 135* 135* 116* 176* 176*   Hemoglobin A1C:  Basename 08/10/11 0654  HGBA1C 6.6*   Fasting Lipid Panel: No results found for this basename: CHOL,HDL,LDLCALC,TRIG,CHOLHDL,LDLDIRECT in the last 72 hours Thyroid Function Tests:  Basename 08/10/11 0654  TSH 1.211  T4TOTAL --  FREET4 --  T3FREE --  THYROIDAB --   Anemia Panel:  Basename 08/10/11 0654  VITAMINB12 544  FOLATE 7.4  FERRITIN 20  TIBC 329  IRON 32*  RETICCTPCT 1.3   Coagulation:  Basename 08/11/11 0416 08/10/11 0654  LABPROT 24.9* 28.4*  INR 2.21* 2.62*   Urine Drug Screen: Drugs of Abuse  No results found for this basename: labopia,  cocainscrnur,  labbenz,  amphetmu,  thcu,  labbarb    Alcohol Level: No results found for this basename: ETH:2 in the last 72 hours Urinalysis: No results found for this basename: COLORURINE:2,APPERANCEUR:2,LABSPEC:2,PHURINE:2,GLUCOSEU:2,HGBUR:2,BILIRUBINUR:2,KETONESUR:2,PROTEINUR:2,UROBILINOGEN:2,NITRITE:2,LEUKOCYTESUR:2 in the last 72 hours Misc. Labs:   Micro: No results found for this or any previous visit (from the past 240 hour(s)).  Studies/Results: Dg Chest 2 View  08/09/2011  *RADIOLOGY REPORT*  Clinical Data: Shortness of breath, cough, history of asthma  CHEST - 2 VIEW  Comparison: Chest x-ray of  05/07/2011  Findings: No active infiltrate or effusion is seen.  Cardiomegaly is stable.  The bones are osteopenic and has been slightly more compression of a mid thoracic vertebral body.  IMPRESSION: None 1.  Stable cardiomegaly.  No active lung disease. 2.  Slightly more compressed mid thoracic vertebral body.  Original Report Authenticated By: Joretta Bachelor, M.D.    Medications: I have reviewed the patient's current medications.  Assessment: Principal Problem:  *Acute bronchitis Active Problems:  HYPOTHYROIDISM  DM  HYPERTENSION  CORONARY ARTERY DISEASE  PULMONARY EMBOLISM  FIBRILLATION, ATRIAL  ASTHMA  GERD  Polymyalgia  rheumatica  Acute respiratory failure  Elevated serum creatinine  Acute renal failure  Microcytic anemia   1. Acute bronchitis. She is being treated with as bronchodilators, prednisone, and Avelox.  Acute renal insufficiency. Resolved with gentle IV fluids.  History of PE. She is adequately anticoagulated.  Paroxysmal a fibrillation. Her rate is controlled on diltiazem. Her INR is therapeutic. Hypertension. Currently stable on multiple medications.  Type 2 diabetes mellitus, currently mildly uncontrolled secondary to steroids. She is being treated with glyburide and sliding scale NovoLog.  Lantus added. Hemoglobin A1c is 6.6.  Microcytic anemia. She has iron deficiency anemia with a total iron of 10 and a ferritin of 20, per iron studies. She has a history of colon polyps. She may need another followup evaluation. We'll continue proton pump inhibitor therapy and add ferrous sulfate.  Leukocytosis, in part steroid-induced.  Plan:  We'll add ferrous sulfate daily. Decrease the dosing of steroids. Increase activity. PT consult in the morning.    LOS: 2 days   Oseias Horsey 08/11/2011, 2:22 PM

## 2011-08-11 NOTE — Progress Notes (Signed)
Pt diminished before treatment, after treatment pt has upper airway wheezes, throat area, some lower. Most wheezes in the past 3 days are in upper airway. Diminished below.

## 2011-08-11 NOTE — Progress Notes (Signed)
ANTICOAGULATION CONSULT NOTE Pharmacy Consult for Warfarin Indication: pulmonary embolus History and Afib  Allergies  Allergen Reactions  . Demerol Nausea And Vomiting    Patient Measurements: Height: 5\' 3"  (160 cm) Weight: 260 lb (117.935 kg) IBW/kg (Calculated) : 52.4   Vital Signs: Temp: 97.7 F (36.5 C) (02/17 0542) Temp src: Oral (02/17 0542) BP: 167/78 mmHg (02/17 0542) Pulse Rate: 70  (02/17 0542)  Labs:  Basename 08/11/11 0416 08/10/11 0654 08/09/11 1500  HGB 10.0* 10.1* --  HCT 33.0* 33.4* 33.6*  PLT 345 360 350  APTT -- -- 46*  LABPROT 24.9* 28.4* 31.6*  INR 2.21* 2.62* 3.00*  HEPARINUNFRC -- -- --  CREATININE 0.86 0.90 1.34*  CKTOTAL -- -- --  CKMB -- -- --  TROPONINI -- -- --   Estimated Creatinine Clearance: 73.4 ml/min (by C-G formula based on Cr of 0.86).  Medical History: Past Medical History  Diagnosis Date  . ASCVD (arteriosclerotic cardiovascular disease)     stent to mid and proximal left anterior descending in 06/2002;drug eluting stent placed in the second diagnol in 08/2003 after  A  non-st elevation myocardial infarction   . Tobacco user     stopped   . Hyperlipidemia     pulmonary embolism 2000 and 09/2008  . Hypertension   . Pulmonary embolism     2000/09/2008  . Thyroid disease     hypothyroidism  . Allergy history unknown   . GERD (gastroesophageal reflux disease)   . Diabetes mellitus     no insulin  . Asthma   . FH: colonic polyps     adenomataous  . Anticoagulated   . Diarrhea     acute  . Rectal bleeding   . Pedal edema   . Irritable bowel syndrome   . Coronary artery disease   . Paroxysmal atrial fibrillation     normal LV function; episodes occurred in 205 and 09/2007    Medications:  Scheduled:     . citalopram  40 mg Oral QPM  . diltiazem  300 mg Oral Daily  . docusate sodium  100 mg Oral BID  . fluticasone  2 puff Inhalation BID  . glyBURIDE  2.5 mg Oral Q breakfast  . insulin aspart  0-20 Units  Subcutaneous TID WC  . insulin aspart  0-5 Units Subcutaneous QHS  . insulin glargine  15 Units Subcutaneous QHS  . ipratropium  0.5 mg Nebulization Q4H  . levalbuterol  0.63 mg Nebulization Q4H  . levothyroxine  50 mcg Oral QPM  . methylPREDNISolone (SOLU-MEDROL) injection  80 mg Intravenous Q6H  . moxifloxacin  400 mg Intravenous Q24H  . olmesartan  40 mg Oral Daily  . pantoprazole  40 mg Oral Q1200  . rosuvastatin  10 mg Oral q1800  . sodium chloride  3 mL Intravenous Q12H  . warfarin  5 mg Oral ONCE-1800  . DISCONTD: simvastatin  40 mg Oral q1800    Assessment: Okay for Protocol Home dose = 5mg  daily except 2.5mg  on Tuesdays. INR trending down. Dose from 2/16 not charted yet.  Spoke with RN, she states dose was given and will correct charting.  Goal of Therapy:  INR 2-3   Plan:  Warfarin 7.5mg  x 1. Daily PT/INR.  Pricilla Larsson 08/11/2011,8:09 AM

## 2011-08-12 ENCOUNTER — Encounter (HOSPITAL_COMMUNITY): Payer: Self-pay | Admitting: Internal Medicine

## 2011-08-12 DIAGNOSIS — D649 Anemia, unspecified: Secondary | ICD-10-CM | POA: Diagnosis not present

## 2011-08-12 DIAGNOSIS — J209 Acute bronchitis, unspecified: Secondary | ICD-10-CM | POA: Diagnosis not present

## 2011-08-12 DIAGNOSIS — E119 Type 2 diabetes mellitus without complications: Secondary | ICD-10-CM | POA: Diagnosis not present

## 2011-08-12 DIAGNOSIS — Z86718 Personal history of other venous thrombosis and embolism: Secondary | ICD-10-CM | POA: Diagnosis not present

## 2011-08-12 LAB — GLUCOSE, CAPILLARY: Glucose-Capillary: 136 mg/dL — ABNORMAL HIGH (ref 70–99)

## 2011-08-12 MED ORDER — WARFARIN SODIUM 5 MG PO TABS: 5.0000 mg | ORAL_TABLET | Freq: Every day | ORAL | Status: DC

## 2011-08-12 MED ORDER — PREDNISONE 10 MG PO TABS: ORAL_TABLET | ORAL | Status: DC

## 2011-08-12 MED ORDER — FERROUS SULFATE 325 (65 FE) MG PO TABS: 325.0000 mg | ORAL_TABLET | Freq: Every day | ORAL | Status: DC

## 2011-08-12 MED ORDER — MOXIFLOXACIN HCL 400 MG PO TABS: 400.0000 mg | ORAL_TABLET | Freq: Every day | ORAL | Status: AC

## 2011-08-12 MED ORDER — PREDNISONE 5 MG PO TABS: 15.0000 mg | ORAL_TABLET | ORAL | Status: DC

## 2011-08-12 NOTE — Progress Notes (Signed)
ANTICOAGULATION CONSULT NOTE Pharmacy Consult for Warfarin Indication: pulmonary embolus History and Afib  Allergies  Allergen Reactions  . Demerol Nausea And Vomiting    Patient Measurements: Height: 5\' 3"  (160 cm) Weight: 260 lb (117.935 kg) IBW/kg (Calculated) : 52.4   Vital Signs: Temp: 97.9 F (36.6 C) (02/18 0545) Temp src: Oral (02/18 0545) BP: 173/77 mmHg (02/18 0545) Pulse Rate: 63  (02/18 0545)  Labs:  Basename 08/12/11 0454 08/11/11 0416 08/10/11 0654 08/09/11 1500  HGB -- 10.0* 10.1* --  HCT -- 33.0* 33.4* 33.6*  PLT -- 345 360 350  APTT -- -- -- 46*  LABPROT 27.6* 24.9* 28.4* --  INR 2.52* 2.21* 2.62* --  HEPARINUNFRC -- -- -- --  CREATININE -- 0.86 0.90 1.34*  CKTOTAL -- -- -- --  CKMB -- -- -- --  TROPONINI -- -- -- --   Estimated Creatinine Clearance: 73.4 ml/min (by C-G formula based on Cr of 0.86).  Medical History: Past Medical History  Diagnosis Date  . ASCVD (arteriosclerotic cardiovascular disease)     stent to mid and proximal left anterior descending in 06/2002;drug eluting stent placed in the second diagnol in 08/2003 after  A  non-st elevation myocardial infarction   . Tobacco user     stopped   . Hyperlipidemia     pulmonary embolism 2000 and 09/2008  . Hypertension   . Pulmonary embolism     2000/09/2008  . Thyroid disease     hypothyroidism  . Allergy history unknown   . GERD (gastroesophageal reflux disease)   . Diabetes mellitus     no insulin  . Asthma   . FH: colonic polyps     adenomataous  . Anticoagulated   . Diarrhea     acute  . Rectal bleeding   . Pedal edema   . Irritable bowel syndrome   . Coronary artery disease   . Paroxysmal atrial fibrillation     normal LV function; episodes occurred in 205 and 09/2007    Medications:  Scheduled:     . citalopram  40 mg Oral QPM  . diltiazem  300 mg Oral Daily  . docusate sodium  100 mg Oral BID  . ferrous sulfate  325 mg Oral Q breakfast  . fluticasone  2 puff  Inhalation BID  . glyBURIDE  2.5 mg Oral Q breakfast  . insulin aspart  0-20 Units Subcutaneous TID WC  . insulin aspart  0-5 Units Subcutaneous QHS  . insulin glargine  15 Units Subcutaneous QHS  . ipratropium  0.5 mg Nebulization Q4H  . levalbuterol  0.63 mg Nebulization Q4H  . levothyroxine  50 mcg Oral QPM  . methylPREDNISolone (SOLU-MEDROL) injection  60 mg Intravenous Q8H  . moxifloxacin  400 mg Intravenous Q24H  . olmesartan  40 mg Oral Daily  . pantoprazole  40 mg Oral Q1200  . rosuvastatin  10 mg Oral q1800  . sodium chloride  3 mL Intravenous Q12H  . warfarin  7.5 mg Oral ONCE-1800  . DISCONTD: methylPREDNISolone (SOLU-MEDROL) injection  80 mg Intravenous Q6H    Assessment: Okay for Protocol Home dose = 5mg  daily except 2.5mg  on Tuesdays. INR therapeutic  Goal of Therapy:  INR 2-3   Plan: Coumadin 5mg  daily for now Daily PT/INR.  [place] Crane, Micai Apolinar A 08/12/2011,8:18 AM

## 2011-08-12 NOTE — Discharge Summary (Signed)
dermatofibroma      Physician Discharge Summary  [name] MRN: [id number] DOB/AGE: 03-07-1941 71 y.o.  PCP: Rubbie Battiest, MD, MD   Admit date: 08/09/2011 Discharge date: 08/12/2011  Discharge Diagnoses:  1. Acute asthmatic bronchitis. 2. Microcytic anemia with iron deficiency. The patient's total iron was 32, TIBC 329, percent saturation 10, ferritin 20, folate 7.4, and vitamin B12 544. The patient may benefit from a followup GI evaluation for further investigation. 3. History of colon polyps. (Adenomatous). 4. Type 2 diabetes mellitus. Hemoglobin A1c was 6.6. 5. History of PE. Anticoagulated on Coumadin. Her INR was 2.52 at the time of discharge. 6. Paroxysmal atrial fibrillation. Stable. 7. Hypertension. Not optimal, defer further management to her primary care physician. 8. History of coronary artery disease. Stable. 9. Acute renal failure. Resolved with gentle hydration. 10. Hypothyroidism. Stable. 11. Steroid dependent polymyalgia rheumatica.    Medication List  As of 08/12/2011 12:25 PM   TAKE these medications         acetaminophen 500 MG tablet   Commonly known as: TYLENOL   Take 500 mg by mouth every 4 (four) hours as needed. For pain. **take with Oxycodone 5mg **      CARDIZEM CD 300 MG 24 hr capsule   Generic drug: diltiazem   TAKE 1 CAPSULE BY MOUTH ONCE A DAY.      chlorzoxazone 500 MG tablet   Commonly known as: PARAFON   Take 500 mg by mouth 3 (three) times daily as needed. Muscle spasms      citalopram 20 MG tablet   Commonly known as: CELEXA   Take 40 mg by mouth every evening.      DIOVAN 320 MG tablet   Generic drug: valsartan   320 mg every morning.      ferrous sulfate 325 (65 FE) MG tablet   Take 1 tablet (325 mg total) by mouth daily with breakfast. IRON SUPPLEMENT.      fluticasone 44 MCG/ACT inhaler   Commonly known as: FLOVENT HFA   Inhale 2 puffs into the lungs at bedtime.      glyBURIDE 5 MG tablet   Commonly known as: DIABETA   Take 2.5 mg by mouth daily with breakfast.      ipratropium 0.02 % nebulizer solution   Commonly known as: ATROVENT   Take 500 mcg by nebulization every 4 (four) hours as needed. For shortness of breath      Levothyroxine Sodium 50 MCG Caps   Take 1 capsule by mouth every evening.      montelukast 10 MG tablet   Commonly known as: SINGULAIR   Take 10 mg by mouth every evening.      moxifloxacin 400 MG tablet   Commonly known as: AVELOX   Take 1 tablet (400 mg total) by mouth daily.      nitroGLYCERIN 0.4 MG SL tablet   Commonly known as: NITROSTAT   Place 1 tablet (0.4 mg total) under the tongue every 5 (five) minutes as needed for chest pain.      omeprazole 20 MG capsule   Commonly known as: PRILOSEC   Take 20 mg by mouth every morning.      oxycodone 5 MG capsule   Commonly known as: OXY-IR   Take 5 mg by mouth every 4 (four) hours as needed. For pain. **To take Tylenol OTC with dose**      pravastatin 80 MG tablet   Commonly known as: PRAVACHOL   Take 80 mg by  mouth every evening.      predniSONE 10 MG tablet   Commonly known as: DELTASONE   STARTING TOMORROW, TAKE 6 TABS FOR ONE DAY; THEN 5 TABS THE NEXT DAY; THEN 4 TABS THE NEXT DAY; THEN 3 TABS THE NEXT DAY; THEN 2 TABS THE NEXT DAY; THEN RESTART YOUR USUAL PREDNISONE DOSING THEREAFTER.      predniSONE 5 MG tablet   Commonly known as: DELTASONE   Take 3 tablets (15 mg total) by mouth every morning. RESTART THIS  WHEN THE OTHER PREDNISONE TAPER IS COMPLETED.      PROAIR HFA 108 (90 BASE) MCG/ACT inhaler   Generic drug: albuterol   Inhale 2 puffs into the lungs every 4 (four) hours as needed. For shortness of breath      albuterol (2.5 MG/3ML) 0.083% nebulizer solution   Commonly known as: PROVENTIL   Take 2.5 mg by nebulization every 4 (four) hours as needed. For shortness of breath      Vitamin D3 2000 UNITS Tabs   Take 2 tablets by mouth daily.      warfarin 5 MG tablet   Commonly known as: COUMADIN    Take 5 mg by mouth every evening. Only takes 0.5 tablet on Tuesday to equal dose of 2.5mg , all other days dose is 5mg . **Takes at 6pm every evening**            Discharge Condition: Stable and improved.  Disposition: Home or Self Care   Consults: None.   Significant Diagnostic Studies: Dg Chest 2 View  08/09/2011  *RADIOLOGY REPORT*  Clinical Data: Shortness of breath, cough, history of asthma  CHEST - 2 VIEW  Comparison: Chest x-ray of 05/07/2011  Findings: No active infiltrate or effusion is seen.  Cardiomegaly is stable.  The bones are osteopenic and has been slightly more compression of a mid thoracic vertebral body.  IMPRESSION: None 1.  Stable cardiomegaly.  No active lung disease. 2.  Slightly more compressed mid thoracic vertebral body.  Original Report Authenticated By: Joretta Bachelor, M.D.     Microbiology: No results found for this or any previous visit (from the past 240 hour(s)).   Labs: Results for orders placed during the hospital encounter of 08/09/11 (from the past 48 hour(s))  GLUCOSE, CAPILLARY     Status: Abnormal   Collection Time   08/10/11  4:08 PM      Component Value Range Comment   Glucose-Capillary 116 (*) 70 - 99 (mg/dL)    Comment 1 Documented in Chart      Comment 2 Notify RN     GLUCOSE, CAPILLARY     Status: Abnormal   Collection Time   08/10/11  8:58 PM      Component Value Range Comment   Glucose-Capillary 135 (*) 70 - 99 (mg/dL)   PROTIME-INR     Status: Abnormal   Collection Time   08/11/11  4:16 AM      Component Value Range Comment   Prothrombin Time 24.9 (*) 11.6 - 15.2 (seconds)    INR 2.21 (*) 0.00 - 1.49    CBC     Status: Abnormal   Collection Time   08/11/11  4:16 AM      Component Value Range Comment   WBC 16.1 (*) 4.0 - 10.5 (K/uL)    RBC 4.45  3.87 - 5.11 (MIL/uL)    Hemoglobin 10.0 (*) 12.0 - 15.0 (g/dL)    HCT 33.0 (*) 36.0 - 46.0 (%)  MCV 74.2 (*) 78.0 - 100.0 (fL)    MCH 22.5 (*) 26.0 - 34.0 (pg)    MCHC 30.3  30.0  - 36.0 (g/dL)    RDW 19.3 (*) 11.5 - 15.5 (%)    Platelets 345  150 - 400 (K/uL)   BASIC METABOLIC PANEL     Status: Abnormal   Collection Time   08/11/11  4:16 AM      Component Value Range Comment   Sodium 137  135 - 145 (mEq/L)    Potassium 4.1  3.5 - 5.1 (mEq/L)    Chloride 102  96 - 112 (mEq/L)    CO2 26  19 - 32 (mEq/L)    Glucose, Bld 178 (*) 70 - 99 (mg/dL)    BUN 27 (*) 6 - 23 (mg/dL)    Creatinine, Ser 0.86  0.50 - 1.10 (mg/dL)    Calcium 9.9  8.4 - 10.5 (mg/dL)    GFR calc non Af Amer 66 (*) >90 (mL/min)    GFR calc Af Amer 76 (*) >90 (mL/min)   HEMOGLOBIN A1C     Status: Abnormal   Collection Time   08/11/11  4:16 AM      Component Value Range Comment   Hemoglobin A1C 6.7 (*) <5.7 (%)    Mean Plasma Glucose 146 (*) <117 (mg/dL)   GLUCOSE, CAPILLARY     Status: Abnormal   Collection Time   08/11/11  7:36 AM      Component Value Range Comment   Glucose-Capillary 135 (*) 70 - 99 (mg/dL)   GLUCOSE, CAPILLARY     Status: Abnormal   Collection Time   08/11/11 10:58 AM      Component Value Range Comment   Glucose-Capillary 139 (*) 70 - 99 (mg/dL)   GLUCOSE, CAPILLARY     Status: Normal   Collection Time   08/11/11  4:06 PM      Component Value Range Comment   Glucose-Capillary 98  70 - 99 (mg/dL)   GLUCOSE, CAPILLARY     Status: Abnormal   Collection Time   08/11/11  8:45 PM      Component Value Range Comment   Glucose-Capillary 197 (*) 70 - 99 (mg/dL)   PROTIME-INR     Status: Abnormal   Collection Time   08/12/11  4:54 AM      Component Value Range Comment   Prothrombin Time 27.6 (*) 11.6 - 15.2 (seconds)    INR 2.52 (*) 0.00 - 1.49    GLUCOSE, CAPILLARY     Status: Abnormal   Collection Time   08/12/11  7:46 AM      Component Value Range Comment   Glucose-Capillary 136 (*) 70 - 99 (mg/dL)    Comment 1 Notify RN     GLUCOSE, CAPILLARY     Status: Abnormal   Collection Time   08/12/11 11:49 AM      Component Value Range Comment   Glucose-Capillary 165 (*) 70 -  99 (mg/dL)      HPI : The patient is a 71 year old woman with a past medical history significant for asthma, polymyalgia rheumatica, hypertension, coronary artery disease, and pulmonary embolism. She presented to the emergency department on 08/09/2011 with a chief complaint of shortness of breath for 2 weeks. She was treated with a Z-Pak and then Biaxin with a prednisone taper by her primary care physician prior to this hospitalization. In the emergency department, she was noted to be afebrile and hemodynamically stable. Her lab data  remarkable for a WBC of 15.4, MCV of 75.5, hemoglobin of 10.3, glucose of 132, creatinine of 1.34, INR 3.0, pro BNP of 325, normal troponin I of 0.01, and a chest x-ray that revealed no acute active lung disease. She was admitted for further evaluation and management.  HOSPITAL COURSE: The patient was started on treatment with high-dose intravenous steroids, IV Avelox, oxygen, and bronchodilators. Gentle IV fluids were started to treat mild dehydration/hypovolemia. She was maintained on all of her chronic medications for hypertension, paroxysmal a fibrillation, and diabetes mellitus. Lantus and sliding scale NovoLog were added for worsening hyperglycemia in the setting of steroid therapy. Her INR was monitored daily.  The patient was noted to have microcytic anemia. She has a history of colon polyps. She was continued on proton pump inhibitor therapy. An anemia panel was ordered. The results were dictated above. Based on the findings, ferrous sulfate was started. She would benefit from a followup GI evaluation, but this will be deferred to her primary care physician. She was also noted to have leukocytosis on admission. This was thought to be secondary to steroid therapy as well as the superimposed infectious acute bronchitis. Her white blood cell count did increase over the course of the hospitalization which was thought to be secondary to steroid therapy solely. She remained  afebrile throughout the entire hospitalization.  The patient's INR remained therapeutic. Her creatinine improved with gentle hydration.  The patient takes Celexa chronically for treatment of depression. The pharmacist informed the dictating physician that the FDA recommends that for patients over 73 years of age, the maximum dose of Celexa should be 20 mg. I discussed this with the patient. She was hesitant about decreasing the dose as it has been very effective for her. I did inform her of the FDA's recommendations. I advised her to talk to her primary care physician about this issue. She appeared to have no sequelae associated with the higher dose of Celexa.  The patient became less symptomatic. Her wheezing almost completely resolved. She remained hemodynamically stable although her blood pressure did increase to the [id number] to [id number] systolically. She was maintained on all of her hypertensive medications. Increase in her blood pressure was likely the consequence of steroid therapy and gentle IV fluids. The IV Solu-Medrol was tapered off in favor of prednisone at the time of discharge. She was discharged to home on her usual bronchodilator therapy, several more days of oral Avelox, and a prednisone taper. Of note, the patient had been on prednisone for treatment of polymyalgia rheumatica. She was advised to hold the prednisone dosing for polymyalgia rheumatica until she completed the prednisone taper for treatment of acute bronchitis... and then restart it.    Discharge Exam: Blood pressure 173/77, pulse 63, temperature 97.9 F (36.6 C), temperature source Oral, resp. rate 18, height 5\' 3"  (1.6 m), weight 117.935 kg (260 lb), SpO2 94.00%. Lungs: Rare/occasional wheeze. Heart: S1, S2, with a soft systolic murmur. Abdomen: Obese, positive bowel sounds, soft, nontender, nondistended. Extremities: No pedal edema.    Discharge Orders    Future Orders Please Complete By Expires   Diet - low sodium  heart healthy      Diet Carb Modified      Increase activity slowly      Discharge instructions      Comments:   TAKE THE PREDNISONE TAPER AS PRESCRIBED, BUT DO NOT RESTART YOUR OTHER PREDNISONE UNTIL AFTER THE TAPER IS COMPLETED.      Follow-up Information    Follow  up with Rubbie Battiest, MD .        Total discharge time: 40 minutes.   Signed: Paulmichael Schreck 08/12/2011, 12:25 PM

## 2011-08-12 NOTE — Progress Notes (Signed)
Pt discharged with instructions, prescriptions and care notes.  Pt verbalized understanding.  Pt left the floor via w/c with staff in stable condition.  All questions were answered.

## 2011-08-12 NOTE — Progress Notes (Signed)
Attempt to do a PT eval was made.  Pt states that she is going home today, has been up walking in the room and does not feel that she has any mobility problems.  She appears to have adequate DME at home.  Because she declines PT, we will d/c service.

## 2011-09-06 DIAGNOSIS — I2699 Other pulmonary embolism without acute cor pulmonale: Secondary | ICD-10-CM | POA: Diagnosis not present

## 2011-09-13 ENCOUNTER — Emergency Department (HOSPITAL_COMMUNITY): Payer: Medicare Other

## 2011-09-13 ENCOUNTER — Other Ambulatory Visit: Payer: Self-pay

## 2011-09-13 ENCOUNTER — Encounter (HOSPITAL_COMMUNITY): Payer: Self-pay | Admitting: Emergency Medicine

## 2011-09-13 ENCOUNTER — Emergency Department (HOSPITAL_COMMUNITY)
Admission: EM | Admit: 2011-09-13 | Discharge: 2011-09-13 | Disposition: A | Discharge status: Home / Self Care | Payer: Medicare Other | Attending: Emergency Medicine | Admitting: Emergency Medicine

## 2011-09-13 DIAGNOSIS — Z79899 Other long term (current) drug therapy: Secondary | ICD-10-CM | POA: Diagnosis not present

## 2011-09-13 DIAGNOSIS — R002 Palpitations: Secondary | ICD-10-CM

## 2011-09-13 DIAGNOSIS — I2510 Atherosclerotic heart disease of native coronary artery without angina pectoris: Secondary | ICD-10-CM | POA: Insufficient documentation

## 2011-09-13 DIAGNOSIS — I4892 Unspecified atrial flutter: Secondary | ICD-10-CM | POA: Diagnosis not present

## 2011-09-13 DIAGNOSIS — R0602 Shortness of breath: Secondary | ICD-10-CM | POA: Diagnosis not present

## 2011-09-13 DIAGNOSIS — I517 Cardiomegaly: Secondary | ICD-10-CM | POA: Diagnosis not present

## 2011-09-13 DIAGNOSIS — R091 Pleurisy: Secondary | ICD-10-CM | POA: Diagnosis not present

## 2011-09-13 DIAGNOSIS — E119 Type 2 diabetes mellitus without complications: Secondary | ICD-10-CM | POA: Insufficient documentation

## 2011-09-13 DIAGNOSIS — E039 Hypothyroidism, unspecified: Secondary | ICD-10-CM | POA: Diagnosis not present

## 2011-09-13 DIAGNOSIS — I10 Essential (primary) hypertension: Secondary | ICD-10-CM | POA: Insufficient documentation

## 2011-09-13 DIAGNOSIS — J45909 Unspecified asthma, uncomplicated: Secondary | ICD-10-CM | POA: Insufficient documentation

## 2011-09-13 LAB — CBC
HCT: 34.9 % — ABNORMAL LOW (ref 36.0–46.0)
Hemoglobin: 10.4 g/dL — ABNORMAL LOW (ref 12.0–15.0)
MCH: 23 pg — ABNORMAL LOW (ref 26.0–34.0)
MCV: 77 fL — ABNORMAL LOW (ref 78.0–100.0)
Platelets: 389 10*3/uL (ref 150–400)
RBC: 4.53 MIL/uL (ref 3.87–5.11)
WBC: 11.4 10*3/uL — ABNORMAL HIGH (ref 4.0–10.5)

## 2011-09-13 LAB — DIFFERENTIAL
Eosinophils Relative: 1 % (ref 0–5)
Lymphocytes Relative: 18 % (ref 12–46)
Lymphs Abs: 2.1 10*3/uL (ref 0.7–4.0)
Monocytes Absolute: 0.9 10*3/uL (ref 0.1–1.0)
Monocytes Relative: 8 % (ref 3–12)

## 2011-09-13 LAB — BASIC METABOLIC PANEL
BUN: 19 mg/dL (ref 6–23)
CO2: 34 mEq/L — ABNORMAL HIGH (ref 19–32)
Calcium: 9.6 mg/dL (ref 8.4–10.5)
Glucose, Bld: 110 mg/dL — ABNORMAL HIGH (ref 70–99)
Sodium: 142 mEq/L (ref 135–145)

## 2011-09-13 NOTE — ED Notes (Signed)
Pt states fluttering lasted 1 hour, denied any chest pain with it. No resp distress/sob noticed. Alert/oriented.

## 2011-09-13 NOTE — ED Provider Notes (Signed)
This chart was scribed for No att. providers found by Zella Ball. The patient was seen in room [facility]01/[facility] at 1:52 PM.  CSN: [id number]  Arrival date & time 09/13/11  1033   First MD Initiated Contact with Patient 09/13/11 1351      Chief Complaint  Patient presents with  . Atrial Flutter  . Shortness of Breath    (Consider location/radiation/quality/duration/timing/severity/associated sxs/prior treatment) Patient is a 71 y.o. female presenting with shortness of breath.  Shortness of Breath  The current episode started today. The problem occurs frequently. The problem has been resolved. The problem is mild. The symptoms are relieved by nothing. The symptoms are aggravated by nothing. Associated symptoms include shortness of breath. There was no intake of a foreign body. She has been behaving normally. Urine output has been normal. There were no sick contacts. Recently, medical care has been given by a specialist.   Nelli P Neill is a 71 y.o. female who presents to the Emergency Department complaining of rapid heartbeat and shortness of breath since this morning. Pt felt weak this morning and her heart was racing for about 30 minutes. Hx of afibb. Sob when walking. Coumadin levels checked last week. Denies any chest pain, cough, sinus infection, nausea, vomiting, or fever. Past Medical History  Diagnosis Date  . ASCVD (arteriosclerotic cardiovascular disease)     stent to mid and proximal left anterior descending in 06/2002;drug eluting stent placed in the second diagnol in 08/2003 after  A  non-st elevation myocardial infarction   . Tobacco user     stopped   . Hyperlipidemia     pulmonary embolism 2000 and 09/2008  . Hypertension   . Pulmonary embolism     2000/09/2008  . Thyroid disease     hypothyroidism  . Allergy history unknown   . GERD (gastroesophageal reflux disease)   . Diabetes mellitus     no insulin  . Asthma   . FH: colonic polyps     adenomataous  .  Anticoagulated   . Diarrhea     acute  . Rectal bleeding   . Pedal edema   . Irritable bowel syndrome   . Coronary artery disease   . Paroxysmal atrial fibrillation     normal LV function; episodes occurred in 205 and 09/2007    Past Surgical History  Procedure Date  . Cleft palate repair     4 surgeries  . Abdominal hysterectomy   . Appendectomy   . Cholecystectomy   . Heel spur surgery   . Knee surgery   . Breast reduction surgery   . Colonoscopy 2011    negative    Family History  Problem Relation Age of Onset  . Heart attack Mother   . Heart attack Father   . Lung cancer Sister   . Lymphoma Brother     History  Substance Use Topics  . Smoking status: Former Research scientist (life sciences)  . Smokeless tobacco: Never Used  . Alcohol Use: No    OB History    Grav Para Term Preterm Abortions TAB SAB Ect Mult Living                  Review of Systems  Respiratory: Positive for shortness of breath.   10 Systems reviewed and are negative for acute change except as noted in the HPI.   Allergies  Demerol  Home Medications   Current Outpatient Rx  Name Route Sig Dispense Refill  . ALBUTEROL SULFATE HFA 108 (90  BASE) MCG/ACT IN AERS Inhalation Inhale 2 puffs into the lungs every 4 (four) hours as needed. For shortness of breath     . CARDIZEM CD 300 MG PO CP24  TAKE 1 CAPSULE BY MOUTH ONCE A DAY. 30 each 6  . VITAMIN D3 2000 UNITS PO TABS Oral Take 2 tablets by mouth daily.     Marland Kitchen CITALOPRAM HYDROBROMIDE 20 MG PO TABS Oral Take 40 mg by mouth every evening.     Marland Kitchen FLUTICASONE PROPIONATE  HFA 44 MCG/ACT IN AERO Inhalation Inhale 2 puffs into the lungs at bedtime.     . FUROSEMIDE 80 MG PO TABS Oral Take 80 mg by mouth daily.    Marland Kitchen LEVOTHYROXINE SODIUM 50 MCG PO TABS Oral Take 50 mcg by mouth daily.    Marland Kitchen MONTELUKAST SODIUM 10 MG PO TABS Oral Take 10 mg by mouth every evening.     Marland Kitchen OMEPRAZOLE 20 MG PO CPDR Oral Take 20 mg by mouth every morning.     Marland Kitchen POTASSIUM CHLORIDE CRYS ER 20 MEQ  PO TBCR Oral Take 20 mEq by mouth daily.    Marland Kitchen PRAVASTATIN SODIUM 40 MG PO TABS Oral Take 40 mg by mouth daily.    Marland Kitchen VALSARTAN 320 MG PO TABS  320 mg every morning.     . WARFARIN SODIUM 5 MG PO TABS Oral Take 5 mg by mouth every evening. Only takes 0.5 tablet on Tuesday to equal dose of 2.5mg , all other days dose is 5mg . **Takes at 6pm every evening**    . NITROGLYCERIN 0.4 MG SL SUBL Sublingual Place 1 tablet (0.4 mg total) under the tongue every 5 (five) minutes as needed for chest pain. 90 tablet 0    BP 142/82  Pulse 75  Temp(Src) 98.2 F (36.8 C) (Oral)  Resp 18  Ht 5\' 3"  (1.6 m)  Wt 250 lb (113.399 kg)  BMI 44.29 kg/m2  SpO2 98%  Physical Exam  Nursing note and vitals reviewed. Constitutional: She is oriented to person, place, and time. She appears well-developed and well-nourished.  HENT:  Head: Normocephalic and atraumatic.  Eyes: Conjunctivae and EOM are normal. Pupils are equal, round, and reactive to light.  Neck: Normal range of motion and phonation normal. Neck supple.  Cardiovascular: Normal rate, regular rhythm and intact distal pulses.   Pulmonary/Chest: Effort normal and breath sounds normal. She exhibits no tenderness.  Abdominal: Soft. She exhibits no distension. There is tenderness (mild diffuse tenderness in abdomen). There is no guarding.  Musculoskeletal: Normal range of motion. She exhibits tenderness (diffuse mild tenderness of lower extremities).  Neurological: She is alert and oriented to person, place, and time. She has normal strength. She exhibits normal muscle tone.  Skin: Skin is warm and dry.  Psychiatric: She has a normal mood and affect. Her behavior is normal. Judgment and thought content normal.    ED Course  Procedures (including critical care time) DIAGNOSTIC STUDIES: Oxygen Saturation is 98% on room air, normal by my interpretation.    COORDINATION OF CARE:  Medications  levothyroxine (SYNTHROID, LEVOTHROID) 50 MCG tablet (not  administered)  pravastatin (PRAVACHOL) 40 MG tablet (not administered)  furosemide (LASIX) 80 MG tablet (not administered)  potassium chloride SA (K-DUR,KLOR-CON) 20 MEQ tablet (not administered)    Date: 09/15/2011  Rate: 80  Rhythm: normal sinus rhythm  QRS Axis: normal  Intervals: normal  ST/T Wave abnormalities: normal  Conduction Disutrbances:none  Narrative Interpretation: poor R wave progression  Old EKG Reviewed: unchanged  Labs Reviewed  CBC - Abnormal; Notable for the following:    WBC 11.4 (*)    Hemoglobin 10.4 (*)    HCT 34.9 (*)    MCV 77.0 (*)    MCH 23.0 (*)    MCHC 29.8 (*)    RDW 21.2 (*)    All other components within normal limits  DIFFERENTIAL - Abnormal; Notable for the following:    Neutro Abs 8.4 (*)    All other components within normal limits  BASIC METABOLIC PANEL - Abnormal; Notable for the following:    CO2 34 (*)    Glucose, Bld 110 (*)    GFR calc non Af Amer 62 (*)    GFR calc Af Amer 71 (*)    All other components within normal limits  TROPONIN I  LAB REPORT - SCANNED   No results found.   1. Palpitations       MDM  Transient palpitation, short-lived without associated symptoms. Screening labs negative      I personally performed the services described in this documentation, which was scribed in my presence. The recorded information has been reviewed and considered.    Plan: Home Medications- usual ; Home Treatments- symptomatic; Recommended follow up- PCP prn        Richarda Blade, MD 09/15/11 1136

## 2011-09-13 NOTE — Discharge Instructions (Signed)
See your Dr. for a checkup for recurrent palpitations or new problems.  Palpitations  A palpitation is the feeling that your heartbeat is irregular or is faster than normal. Although this is frightening, it usually is not serious. Palpitations may be caused by excesses of smoking, caffeine, or alcohol. They are also brought on by stress and anxiety. Sometimes, they are caused by heart disease. Unless otherwise noted, your caregiver did not find any signs of serious illness at this time. HOME CARE INSTRUCTIONS  To help prevent palpitations:  Drink decaffeinated coffee, tea, and soda pop. Avoid chocolate.   If you smoke or drink alcohol, quit or cut down as much as possible.   Reduce your stress or anxiety level. Biofeedback, yoga, or meditation will help you relax. Physical activity such as swimming, jogging, or walking also may be helpful.  SEEK MEDICAL CARE IF:   You continue to have a fast heartbeat.   Your palpitations occur more often.  SEEK IMMEDIATE MEDICAL CARE IF: You develop chest pain, shortness of breath, severe headache, dizziness, or fainting. Document Released: 06/07/2000 Document Revised: 05/30/2011 Document Reviewed: 08/07/2007 [facility] Patient Information 2012 [place].

## 2011-09-13 NOTE — ED Notes (Signed)
Patient is resting comfortably. 

## 2011-09-13 NOTE — ED Notes (Signed)
Discharge instructions reviewed with pt, questions answered. Pt verbalized understanding.  

## 2011-09-13 NOTE — ED Notes (Signed)
Pt states she experienced a "flutter" feeling this morning.  She also has shortness of breath on exertion. Pt denies shortness of breath right now.  Pt states she "always has chest pain" in the mid sternal region.

## 2011-09-13 NOTE — ED Notes (Signed)
Family at bedside. Patient is waiting on discharge

## 2011-09-13 NOTE — Progress Notes (Signed)
Date: 09/13/2011  1046  Rate: 80  Rhythm: normal sinus rhythm  QRS Axis: normal  Intervals: normal  ST/T Wave abnormalities: normal  Conduction Disutrbances:none  Narrative Interpretation: possible anterior infarct cited on or before 03/04/11  Old EKG Reviewed: unchanged c/w 03/04/11

## 2011-09-17 DIAGNOSIS — I10 Essential (primary) hypertension: Secondary | ICD-10-CM | POA: Diagnosis not present

## 2011-09-17 DIAGNOSIS — E119 Type 2 diabetes mellitus without complications: Secondary | ICD-10-CM | POA: Diagnosis not present

## 2011-09-17 DIAGNOSIS — R5383 Other fatigue: Secondary | ICD-10-CM | POA: Diagnosis not present

## 2011-09-17 DIAGNOSIS — R5381 Other malaise: Secondary | ICD-10-CM | POA: Diagnosis not present

## 2011-09-17 DIAGNOSIS — I259 Chronic ischemic heart disease, unspecified: Secondary | ICD-10-CM | POA: Diagnosis not present

## 2011-10-01 DIAGNOSIS — E782 Mixed hyperlipidemia: Secondary | ICD-10-CM | POA: Diagnosis not present

## 2011-10-01 DIAGNOSIS — Z79899 Other long term (current) drug therapy: Secondary | ICD-10-CM | POA: Diagnosis not present

## 2011-10-01 DIAGNOSIS — R5381 Other malaise: Secondary | ICD-10-CM | POA: Diagnosis not present

## 2011-10-02 DIAGNOSIS — E785 Hyperlipidemia, unspecified: Secondary | ICD-10-CM | POA: Diagnosis not present

## 2011-10-02 DIAGNOSIS — I259 Chronic ischemic heart disease, unspecified: Secondary | ICD-10-CM | POA: Diagnosis not present

## 2011-10-02 DIAGNOSIS — N200 Calculus of kidney: Secondary | ICD-10-CM | POA: Diagnosis not present

## 2011-10-02 DIAGNOSIS — Q612 Polycystic kidney, adult type: Secondary | ICD-10-CM | POA: Diagnosis not present

## 2011-10-08 DIAGNOSIS — I4891 Unspecified atrial fibrillation: Secondary | ICD-10-CM | POA: Diagnosis not present

## 2011-10-08 DIAGNOSIS — D689 Coagulation defect, unspecified: Secondary | ICD-10-CM | POA: Diagnosis not present

## 2011-10-15 ENCOUNTER — Other Ambulatory Visit: Payer: Self-pay | Admitting: Cardiology

## 2011-10-15 DIAGNOSIS — H35379 Puckering of macula, unspecified eye: Secondary | ICD-10-CM | POA: Diagnosis not present

## 2011-10-15 DIAGNOSIS — H35039 Hypertensive retinopathy, unspecified eye: Secondary | ICD-10-CM | POA: Diagnosis not present

## 2011-10-15 DIAGNOSIS — H04129 Dry eye syndrome of unspecified lacrimal gland: Secondary | ICD-10-CM | POA: Diagnosis not present

## 2011-10-15 DIAGNOSIS — H43819 Vitreous degeneration, unspecified eye: Secondary | ICD-10-CM | POA: Diagnosis not present

## 2011-10-15 DIAGNOSIS — H35059 Retinal neovascularization, unspecified, unspecified eye: Secondary | ICD-10-CM | POA: Diagnosis not present

## 2011-10-15 DIAGNOSIS — E119 Type 2 diabetes mellitus without complications: Secondary | ICD-10-CM | POA: Diagnosis not present

## 2011-10-15 DIAGNOSIS — H40019 Open angle with borderline findings, low risk, unspecified eye: Secondary | ICD-10-CM | POA: Diagnosis not present

## 2011-11-04 ENCOUNTER — Other Ambulatory Visit: Payer: Self-pay | Admitting: Family Medicine

## 2011-11-04 DIAGNOSIS — Z139 Encounter for screening, unspecified: Secondary | ICD-10-CM

## 2011-11-08 DIAGNOSIS — I4891 Unspecified atrial fibrillation: Secondary | ICD-10-CM | POA: Diagnosis not present

## 2011-11-12 DIAGNOSIS — H2510 Age-related nuclear cataract, unspecified eye: Secondary | ICD-10-CM | POA: Diagnosis not present

## 2011-11-12 DIAGNOSIS — H269 Unspecified cataract: Secondary | ICD-10-CM | POA: Diagnosis not present

## 2011-11-12 DIAGNOSIS — H538 Other visual disturbances: Secondary | ICD-10-CM | POA: Diagnosis not present

## 2011-11-14 ENCOUNTER — Other Ambulatory Visit (INDEPENDENT_AMBULATORY_CARE_PROVIDER_SITE_OTHER): Payer: Self-pay | Admitting: Internal Medicine

## 2011-11-21 ENCOUNTER — Ambulatory Visit (HOSPITAL_COMMUNITY)
Admission: RE | Admit: 2011-11-21 | Discharge: 2011-11-21 | Disposition: A | Discharge status: Home / Self Care | Payer: Medicare Other | Source: Ambulatory Visit | Attending: Family Medicine | Admitting: Family Medicine

## 2011-11-21 DIAGNOSIS — Z1231 Encounter for screening mammogram for malignant neoplasm of breast: Secondary | ICD-10-CM | POA: Diagnosis not present

## 2011-11-21 DIAGNOSIS — Z139 Encounter for screening, unspecified: Secondary | ICD-10-CM

## 2011-12-12 DIAGNOSIS — H2510 Age-related nuclear cataract, unspecified eye: Secondary | ICD-10-CM | POA: Diagnosis not present

## 2011-12-17 DIAGNOSIS — M545 Low back pain, unspecified: Secondary | ICD-10-CM | POA: Diagnosis not present

## 2011-12-17 DIAGNOSIS — J45909 Unspecified asthma, uncomplicated: Secondary | ICD-10-CM | POA: Diagnosis not present

## 2011-12-17 DIAGNOSIS — Z7901 Long term (current) use of anticoagulants: Secondary | ICD-10-CM | POA: Diagnosis not present

## 2011-12-17 DIAGNOSIS — E119 Type 2 diabetes mellitus without complications: Secondary | ICD-10-CM | POA: Diagnosis not present

## 2011-12-24 DIAGNOSIS — H269 Unspecified cataract: Secondary | ICD-10-CM | POA: Diagnosis not present

## 2011-12-24 DIAGNOSIS — H2510 Age-related nuclear cataract, unspecified eye: Secondary | ICD-10-CM | POA: Diagnosis not present

## 2012-01-17 DIAGNOSIS — I4891 Unspecified atrial fibrillation: Secondary | ICD-10-CM | POA: Diagnosis not present

## 2012-02-06 ENCOUNTER — Other Ambulatory Visit: Payer: Self-pay | Admitting: Cardiology

## 2012-02-06 ENCOUNTER — Telehealth: Payer: Self-pay | Admitting: Cardiology

## 2012-02-06 NOTE — Telephone Encounter (Signed)
Called pharmacy and request that they send refill request to primary care doctor because we didn't follow her coumadin.

## 2012-03-12 ENCOUNTER — Other Ambulatory Visit: Payer: Self-pay | Admitting: *Deleted

## 2012-03-12 MED ORDER — DILTIAZEM HCL ER COATED BEADS 300 MG PO CP24: 300.0000 mg | ORAL_CAPSULE | Freq: Every day | ORAL | Status: DC

## 2012-03-19 DIAGNOSIS — I4891 Unspecified atrial fibrillation: Secondary | ICD-10-CM | POA: Diagnosis not present

## 2012-03-25 ENCOUNTER — Other Ambulatory Visit: Payer: Self-pay | Admitting: Cardiology

## 2012-04-21 ENCOUNTER — Other Ambulatory Visit: Payer: Self-pay

## 2012-04-21 DIAGNOSIS — M129 Arthropathy, unspecified: Secondary | ICD-10-CM | POA: Diagnosis not present

## 2012-04-21 DIAGNOSIS — E119 Type 2 diabetes mellitus without complications: Secondary | ICD-10-CM | POA: Diagnosis not present

## 2012-04-21 DIAGNOSIS — R0600 Dyspnea, unspecified: Secondary | ICD-10-CM

## 2012-04-21 DIAGNOSIS — M353 Polymyalgia rheumatica: Secondary | ICD-10-CM | POA: Diagnosis not present

## 2012-04-21 DIAGNOSIS — I259 Chronic ischemic heart disease, unspecified: Secondary | ICD-10-CM | POA: Diagnosis not present

## 2012-04-21 DIAGNOSIS — Z23 Encounter for immunization: Secondary | ICD-10-CM | POA: Diagnosis not present

## 2012-04-23 DIAGNOSIS — Z79899 Other long term (current) drug therapy: Secondary | ICD-10-CM | POA: Diagnosis not present

## 2012-04-23 DIAGNOSIS — IMO0001 Reserved for inherently not codable concepts without codable children: Secondary | ICD-10-CM | POA: Diagnosis not present

## 2012-04-23 DIAGNOSIS — E782 Mixed hyperlipidemia: Secondary | ICD-10-CM | POA: Diagnosis not present

## 2012-04-23 DIAGNOSIS — R5381 Other malaise: Secondary | ICD-10-CM | POA: Diagnosis not present

## 2012-04-30 ENCOUNTER — Other Ambulatory Visit: Payer: Self-pay | Admitting: Family Medicine

## 2012-04-30 ENCOUNTER — Ambulatory Visit (HOSPITAL_COMMUNITY)
Admission: RE | Admit: 2012-04-30 | Discharge: 2012-04-30 | Disposition: A | Discharge status: Home / Self Care | Payer: Medicare Other | Source: Ambulatory Visit | Attending: Family Medicine | Admitting: Family Medicine

## 2012-04-30 DIAGNOSIS — R0602 Shortness of breath: Secondary | ICD-10-CM | POA: Diagnosis not present

## 2012-04-30 DIAGNOSIS — I10 Essential (primary) hypertension: Secondary | ICD-10-CM | POA: Insufficient documentation

## 2012-04-30 DIAGNOSIS — M8448XA Pathological fracture, other site, initial encounter for fracture: Secondary | ICD-10-CM | POA: Diagnosis not present

## 2012-04-30 DIAGNOSIS — M47814 Spondylosis without myelopathy or radiculopathy, thoracic region: Secondary | ICD-10-CM | POA: Insufficient documentation

## 2012-04-30 DIAGNOSIS — J45909 Unspecified asthma, uncomplicated: Secondary | ICD-10-CM | POA: Diagnosis not present

## 2012-04-30 DIAGNOSIS — R0600 Dyspnea, unspecified: Secondary | ICD-10-CM

## 2012-04-30 MED ORDER — ALBUTEROL SULFATE (5 MG/ML) 0.5% IN NEBU
2.5000 mg | INHALATION_SOLUTION | Freq: Once | RESPIRATORY_TRACT | Status: AC
  Administered 2012-04-30: 2.5 mg via RESPIRATORY_TRACT

## 2012-04-30 NOTE — Procedures (Signed)
NAMEFLORABELL, VIOLETT                ACCOUNT NO.:  [id number]  MEDICAL RECORD NO.:  [id number]  LOCATION:  RESP                          FACILITY:  [facility]  PHYSICIAN:  Aldrin Engelhard L. Luan Pulling, M.D.DATE OF BIRTH:  07-Mar-1941  DATE OF PROCEDURE: DATE OF DISCHARGE:                           PULMONARY FUNCTION TEST   Reason for pulmonary function testing is shortness of breath. 1. Spirometry shows a moderate ventilatory defect without definite     airflow obstruction.  There is some change at the level of the     smaller airways. 2. There is significant improvement with inhaled bronchodilator. 3. He may need more extensive pulmonary function testing to rule out     restrictive pulmonary disease.     Markeya Mincy L. Luan Pulling, M.D.     ELH/MEDQ  D:  04/30/2012  T:  04/30/2012  Job:  [id number]

## 2012-05-04 ENCOUNTER — Ambulatory Visit (INDEPENDENT_AMBULATORY_CARE_PROVIDER_SITE_OTHER): Payer: Medicare Other | Admitting: Internal Medicine

## 2012-05-04 ENCOUNTER — Encounter (INDEPENDENT_AMBULATORY_CARE_PROVIDER_SITE_OTHER): Payer: Self-pay | Admitting: Internal Medicine

## 2012-05-04 VITALS — BP 130/90 | HR 78 | Temp 98.4°F | Resp 20 | Ht 63.0 in | Wt 249.0 lb

## 2012-05-04 DIAGNOSIS — R197 Diarrhea, unspecified: Secondary | ICD-10-CM | POA: Diagnosis not present

## 2012-05-04 DIAGNOSIS — R1310 Dysphagia, unspecified: Secondary | ICD-10-CM

## 2012-05-04 MED ORDER — DIPHENOXYLATE-ATROPINE 2.5-0.025 MG PO TABS: 1.0000 | ORAL_TABLET | Freq: Two times a day (BID) | ORAL | Status: DC | PRN

## 2012-05-04 NOTE — Progress Notes (Signed)
Presenting complaint;  Followup for diarrhea. Patient also complains of dysphagia.  Subjective:  Patient is 71 year old Caucasian female who presents for yearly visit. She has intermittent diarrhea. At times she is constipated. On most days she takes 1-2 Lomotil tablets per day. She states her appetite is terrible. She eats 1 meal a day. She is still having difficulty losing weight. Her weight is down by 6 pounds since her last visit of September 2012. She also complains of dysphagia both to solids as well as liquids. She points to her suprasternal area at site of bolus obstruction. She has to symptom partially every day. Her heartburn is well controlled with therapy. She does have intermittent cough and hoarseness. Her last EGD an EGD was in December 2010 and she believes it helped her for several months.  Current Medications: Current Outpatient Prescriptions  Medication Sig Dispense Refill  . albuterol (PROAIR HFA) 108 (90 BASE) MCG/ACT inhaler Inhale 2 puffs into the lungs every 4 (four) hours as needed. For shortness of breath       . Cholecalciferol (VITAMIN D3) 2000 UNITS TABS Take 2 tablets by mouth daily.       . citalopram (CELEXA) 20 MG tablet Take 40 mg by mouth every evening.       . diltiazem (CARDIZEM CD) 300 MG 24 hr capsule Take 1 capsule (300 mg total) by mouth daily.  30 capsule  11  . DIOVAN 320 MG tablet TAKE (1) TABLET BY MOUTH ONCE DAILY.  30 tablet  6  . diphenoxylate-atropine (LOMOTIL) 2.5-0.025 MG per tablet Take 1 tablet by mouth. Patient states that she takes this medication 1-2 by mouth as needed for diarrhea      . fluticasone (FLOVENT HFA) 44 MCG/ACT inhaler Inhale 2 puffs into the lungs at bedtime.       . furosemide (LASIX) 80 MG tablet Take 80 mg by mouth daily.      Marland Kitchen levothyroxine (SYNTHROID, LEVOTHROID) 50 MCG tablet Take 50 mcg by mouth daily.      . montelukast (SINGULAIR) 10 MG tablet Take 10 mg by mouth every evening.       Marland Kitchen omeprazole (PRILOSEC) 20 MG  capsule TAKE 1 CAPSULE BY MOUTH TWICE DAILY.  60 capsule  11  . oxycodone (OXY-IR) 5 MG capsule Take 5 mg by mouth every 4 (four) hours as needed.      . potassium chloride SA (K-DUR,KLOR-CON) 20 MEQ tablet Take 20 mEq by mouth daily.      . pravastatin (PRAVACHOL) 40 MG tablet Take 40 mg by mouth daily.      . predniSONE (DELTASONE) 10 MG tablet Take 10 mg by mouth daily.      Marland Kitchen warfarin (COUMADIN) 5 MG tablet Take 5 mg by mouth every evening. Only takes 0.5 tablet on Tuesday to equal dose of 2.5mg , all other days dose is 5mg . **Takes at 6pm every evening**      . nitroGLYCERIN (NITROSTAT) 0.4 MG SL tablet Place 1 tablet (0.4 mg total) under the tongue every 5 (five) minutes as needed for chest pain.  90 tablet  0  . [DISCONTINUED] ferrous sulfate 325 (65 FE) MG tablet Take 1 tablet (325 mg total) by mouth daily with breakfast. IRON SUPPLEMENT.      . [DISCONTINUED] glyBURIDE (DIABETA) 5 MG tablet Take 2.5 mg by mouth daily with breakfast.       . [DISCONTINUED] ipratropium (ATROVENT) 0.02 % nebulizer solution Take 500 mcg by nebulization every 4 (four) hours as needed.  For shortness of breath         Objective: Blood pressure 130/90, pulse 78, temperature 98.4 F (36.9 C), temperature source Oral, resp. rate 20, height 5\' 3"  (1.6 m), weight 249 lb (112.946 kg). Patient is alert and in no acute distress. Conjunctiva is pink. Sclera is nonicteric Oropharyngeal mucosa is normal. No neck masses or thyromegaly noted. Abdomen is full. It is soft and nontender without organomegaly or masses No LE edema or clubbing noted.   Assessment:.  #1. Dysphagia. Suspect she has esophageal motility disorder. She did respond to esophageal dilation in December 2010. Will reevaluate her with BPE. #2. Diarrhea secondary to irritable bowel syndrome. Last colonoscopy was in December 2010   Plan:  Patient advised to take one diphenoxylate tablet every morning. Barium pill esophagogram to be  scheduled.

## 2012-05-04 NOTE — Patient Instructions (Signed)
Take Lomotil or diphenoxylate one tablet by mouth every morning for few weeks and see if you do better compared to when necessary use. Barium pill study to be scheduled.

## 2012-05-05 LAB — PULMONARY FUNCTION TEST

## 2012-05-07 DIAGNOSIS — M353 Polymyalgia rheumatica: Secondary | ICD-10-CM | POA: Diagnosis not present

## 2012-05-08 ENCOUNTER — Ambulatory Visit (HOSPITAL_COMMUNITY)
Admission: RE | Admit: 2012-05-08 | Discharge: 2012-05-08 | Disposition: A | Discharge status: Home / Self Care | Payer: Medicare Other | Source: Ambulatory Visit | Attending: Internal Medicine | Admitting: Internal Medicine

## 2012-05-08 DIAGNOSIS — K219 Gastro-esophageal reflux disease without esophagitis: Secondary | ICD-10-CM | POA: Insufficient documentation

## 2012-05-08 DIAGNOSIS — K449 Diaphragmatic hernia without obstruction or gangrene: Secondary | ICD-10-CM | POA: Insufficient documentation

## 2012-05-08 DIAGNOSIS — K224 Dyskinesia of esophagus: Secondary | ICD-10-CM | POA: Diagnosis not present

## 2012-05-08 DIAGNOSIS — R1310 Dysphagia, unspecified: Secondary | ICD-10-CM

## 2012-06-23 DIAGNOSIS — I4891 Unspecified atrial fibrillation: Secondary | ICD-10-CM | POA: Diagnosis not present

## 2012-08-14 DIAGNOSIS — I259 Chronic ischemic heart disease, unspecified: Secondary | ICD-10-CM | POA: Diagnosis not present

## 2012-08-14 DIAGNOSIS — E119 Type 2 diabetes mellitus without complications: Secondary | ICD-10-CM | POA: Diagnosis not present

## 2012-08-14 DIAGNOSIS — I4891 Unspecified atrial fibrillation: Secondary | ICD-10-CM | POA: Diagnosis not present

## 2012-09-11 ENCOUNTER — Telehealth: Payer: Self-pay | Admitting: Family Medicine

## 2012-09-11 NOTE — Telephone Encounter (Signed)
I do not have her char. Please pull chart and write and i will sign. thanks

## 2012-09-11 NOTE — Telephone Encounter (Signed)
Patient needs a refill of her oxycodone 5 mg

## 2012-09-24 DIAGNOSIS — IMO0002 Reserved for concepts with insufficient information to code with codable children: Secondary | ICD-10-CM | POA: Diagnosis not present

## 2012-09-24 DIAGNOSIS — M1710 Unilateral primary osteoarthritis, unspecified knee: Secondary | ICD-10-CM | POA: Diagnosis not present

## 2012-09-26 ENCOUNTER — Other Ambulatory Visit: Payer: Self-pay

## 2012-09-26 DIAGNOSIS — Z7901 Long term (current) use of anticoagulants: Secondary | ICD-10-CM

## 2012-09-30 ENCOUNTER — Other Ambulatory Visit (INDEPENDENT_AMBULATORY_CARE_PROVIDER_SITE_OTHER): Payer: Self-pay | Admitting: Internal Medicine

## 2012-09-30 DIAGNOSIS — R197 Diarrhea, unspecified: Secondary | ICD-10-CM

## 2012-09-30 MED ORDER — DIPHENOXYLATE-ATROPINE 2.5-0.025 MG PO TABS: 1.0000 | ORAL_TABLET | Freq: Two times a day (BID) | ORAL | Status: DC | PRN

## 2012-10-14 ENCOUNTER — Ambulatory Visit (HOSPITAL_COMMUNITY): Payer: Medicare Other | Admitting: Physical Therapy

## 2012-10-16 ENCOUNTER — Telehealth: Payer: Self-pay | Admitting: Family Medicine

## 2012-10-16 NOTE — Telephone Encounter (Signed)
Pt is out of her Oxycodone, an really needs to pick it up today if possible.  She is sorry she didn't call sooner, but her brother passed away this week and she didn't realize she was out till today.

## 2012-10-16 NOTE — Telephone Encounter (Signed)
wis 

## 2012-10-19 ENCOUNTER — Telehealth: Payer: Self-pay | Admitting: *Deleted

## 2012-10-19 ENCOUNTER — Ambulatory Visit (HOSPITAL_COMMUNITY)
Admission: RE | Admit: 2012-10-19 | Discharge: 2012-10-19 | Disposition: A | Discharge status: Home / Self Care | Payer: Medicare Other | Source: Ambulatory Visit | Attending: Orthopedic Surgery | Admitting: Orthopedic Surgery

## 2012-10-19 ENCOUNTER — Other Ambulatory Visit: Payer: Self-pay | Admitting: Family Medicine

## 2012-10-19 DIAGNOSIS — M6281 Muscle weakness (generalized): Secondary | ICD-10-CM | POA: Insufficient documentation

## 2012-10-19 DIAGNOSIS — IMO0001 Reserved for inherently not codable concepts without codable children: Secondary | ICD-10-CM | POA: Diagnosis not present

## 2012-10-19 DIAGNOSIS — R262 Difficulty in walking, not elsewhere classified: Secondary | ICD-10-CM | POA: Diagnosis not present

## 2012-10-19 DIAGNOSIS — M25569 Pain in unspecified knee: Secondary | ICD-10-CM | POA: Diagnosis not present

## 2012-10-19 DIAGNOSIS — R29898 Other symptoms and signs involving the musculoskeletal system: Secondary | ICD-10-CM | POA: Insufficient documentation

## 2012-10-19 DIAGNOSIS — E119 Type 2 diabetes mellitus without complications: Secondary | ICD-10-CM | POA: Diagnosis not present

## 2012-10-19 DIAGNOSIS — I10 Essential (primary) hypertension: Secondary | ICD-10-CM | POA: Diagnosis not present

## 2012-10-19 NOTE — Telephone Encounter (Signed)
Left message to pick up RX at front desk

## 2012-10-19 NOTE — Telephone Encounter (Signed)
Script ready for pickup pt notified.

## 2012-10-19 NOTE — Evaluation (Signed)
Physical Therapy Evaluation  Patient Details  Name: [name] MRN: [id number] Date of Birth: June 13, 1941 Charge: evaluation Today's Date: 10/19/2012 Time: [id number] PT Time Calculation (min): 39 min              Visit#: 1 of 8  Re-eval: 11/18/12 Assessment Diagnosis: L knee pain, weakness, difficulty walking Next MD Visit: 10/29/2012 Prior Therapy: none  Authorization: Medicare    Authorization Time Period:    Authorization Visit#: 1 of 8   Past Medical History:  Past Medical History  Diagnosis Date  . ASCVD (arteriosclerotic cardiovascular disease)     stent to mid and proximal left anterior descending in 06/2002;drug eluting stent placed in the second diagnol in 08/2003 after  A  non-st elevation myocardial infarction   . Tobacco user     stopped   . Hyperlipidemia     pulmonary embolism 2000 and 09/2008  . Hypertension   . Pulmonary embolism     2000/09/2008  . Thyroid disease     hypothyroidism  . Allergy history unknown   . GERD (gastroesophageal reflux disease)   . Diabetes mellitus     no insulin  . Asthma   . FH: colonic polyps     adenomataous  . Anticoagulated   . Diarrhea     acute  . Rectal bleeding   . Pedal edema   . Irritable bowel syndrome   . Coronary artery disease   . Paroxysmal atrial fibrillation     normal LV function; episodes occurred in 205 and 09/2007  . Hypothyroidism   . Peripheral edema   . Allergy   . Atrial fibrillation   . Chronic anticoagulation   . Venous stasis   . Insomnia    Past Surgical History:  Past Surgical History  Procedure Laterality Date  . Cleft palate repair      4 surgeries  . Abdominal hysterectomy    . Appendectomy    . Cholecystectomy    . Heel spur surgery    . Knee surgery    . Breast reduction surgery    . Colonoscopy  2011    negative  . Oophorectomy    . Breast surgery      Subjective Symptoms/Limitations Symptoms: [name] states that she has been having  left knee pain for three years  but in the past few most it has really gotten worse.  She states her knee wants to give way on her on a daily basis and she has fallen several times..  She states that she is always using her walker or cane. She has been referred to therapy to attempt to decrease her risk of falling, decrease her pain and improve her functional abilityl. How long can you sit comfortably?: Able to sit for 90 minutes, ( after sitting to see a movie it is very hard to start walking again because she is so stiff) How long can you stand comfortably?: She has pain in the back of her legs after standing 5-10 minutes. How long can you walk comfortably?: She always uses an assistive device and walks at the most 5-10 minutes. Special Tests: Pt has difficulty coming sit to stand; at church her brother has to pull her up. Pain Assessment Currently in Pain?: Yes Pain Score:   9 Pain Location: Knee (Pt states she hurts all over states she has fibromyalgia.) Pain Orientation: Left Pain Type: Chronic pain Pain Radiating Towards: mid calf Pain Onset: More than a month ago Pain Frequency: Constant  Pain Relieving Factors: medication Effect of Pain on Daily Activities: increases  Precautions/Restrictions     Balance Screening Balance Screen Has the patient fallen in the past 6 months: Yes How many times?:  (3) Has the patient had a decrease in activity level because of a fear of falling? : Yes Is the patient reluctant to leave their home because of a fear of falling? : Yes  Prior Functioning  Prior Function Driving: Yes Leisure: Hobbies-yes (Comment) Comments: puzzles, craft  Cognition/Observation    Sensation/Coordination/Flexibility/Functional Tests    Assessment LLE AROM (degrees) Left Knee Extension: 5 Left Knee Flexion: 125 LLE Strength Left Hip Flexion: 2/5 Left Hip Extension: 2/5 Left Hip ABduction: 3+/5 Left Knee Flexion: 3/5 Left Knee Extension: 3/5 Left Ankle Dorsiflexion:  3/5  Exercise/Treatments   Supine Quad Sets: 10 reps Bridges: 10 reps Straight Leg Raises: 10 reps;Limitations Straight Leg Raises Limitations: bent knee Sidelying Hip ABduction: 10 reps       Physical Therapy Assessment and Plan PT Assessment and Plan Clinical Impression Statement: Pt with chronic OA and acute exacerbation of pain who's knee is giving way causing her to fall.  Pt has decreased ROM, and decreased strength.  She will benefit from skilled therapy to improve her functional ability and increase the quality of  her live. Pt will benefit from skilled therapeutic intervention in order to improve on the following deficits: Decreased activity tolerance;Decreased strength;Difficulty walking;Pain;Obesity Rehab Potential: Good PT Frequency: Min 2X/week PT Duration: 8 weeks PT Treatment/Interventions: Therapeutic activities;Therapeutic exercise;Balance training;Functional mobility training;Modalities;Manual techniques PT Plan: Pt to begin nu-step; standing knee flextion, SLS, terminal extension both standing and supine, PRE with exercise and progress standing activities as toleratedl    Goals Home Exercise Program Pt will Perform Home Exercise Program: Independently PT Short Term Goals Time to Complete Short Term Goals: 2 weeks PT Short Term Goal 1: Pt to be able to walk for 15 minues PT Short Term Goal 2: Pt pain to be no greater than a 5/10 PT Long Term Goals Time to Complete Long Term Goals: 4 weeks PT Long Term Goal 1: pt I in advance HEP PT Long Term Goal 2: pt strength to be at least a 4/5 to allow sitting and sit to stand to be easier and to be able come sit to stand on the first attempt. Long Term Goal 3: Pain no greater than a 3/10 80% of the day Long Term Goal 4: LE not to give away for 2 weeks. PT Long Term Goal 5: LEFS improved by 10   Problem List Patient Active Problem List   Diagnosis Date Noted  . Difficulty in walking 10/19/2012  . Weakness of left leg  10/19/2012  . Long term (current) use of anticoagulants 09/26/2012  . Dysphagia 05/04/2012  . Acute renal failure 08/10/2011  . Microcytic anemia 08/10/2011  . Acute bronchitis 08/09/2011  . Acute respiratory failure 08/09/2011  . IBS (irritable bowel syndrome) 08/09/2011  . Elevated serum creatinine 08/09/2011  . Chest pressure 03/05/2011  . DOE (dyspnea on exertion) 03/05/2011  . Polymyalgia rheumatica 03/05/2011  . Anemia 03/05/2011  . Peripheral neuropathy 10/02/2010  . 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 HYPOTHYROIDISM 12/02/2008  . DM 12/02/2008  . HYPERLIPIDEMIA 12/02/2008  . HYPERTENSION 12/02/2008  . CORONARY ARTERY DISEASE 12/02/2008  . FIBRILLATION, ATRIAL 12/02/2008  . ASTHMA 12/02/2008  . GERD 12/02/2008  . TOBACCO USE, QUIT 12/02/2008    PT - End of Session Activity Tolerance: Patient tolerated  treatment well General Behavior During Therapy: WFL for tasks assessed/performed Cognition: WFL for tasks performed PT Plan of Care PT Home Exercise Plan: given Consulted and Agree with Plan of Care: Patient  GP Functional Assessment Tool Used: LEFS Functional Limitation: Mobility: Walking and moving around Mobility: Walking and Moving Around Current Status [id number]): At least 60 percent but less than 80 percent impaired, limited or restricted Mobility: Walking and Moving Around Goal Status [phone]): At least 20 percent but less than 40 percent impaired, limited or restricted  Quaneshia Wareing,CINDY 10/19/2012, 4:17 PM  Physician Documentation Your signature is required to indicate approval of the treatment plan as stated above.  Please sign and either send electronically or make a copy of this report for your files and return this physician signed original.   Please mark one 1.__approve of plan  2. ___approve of plan with the following conditions.   ______________________________                                                           _____________________ Physician Signature                                                                                                             Date

## 2012-10-21 ENCOUNTER — Ambulatory Visit (HOSPITAL_COMMUNITY)
Admission: RE | Admit: 2012-10-21 | Discharge: 2012-10-21 | Disposition: A | Discharge status: Home / Self Care | Payer: Medicare Other | Source: Ambulatory Visit | Attending: Orthopedic Surgery | Admitting: Orthopedic Surgery

## 2012-10-21 DIAGNOSIS — R29898 Other symptoms and signs involving the musculoskeletal system: Secondary | ICD-10-CM

## 2012-10-21 DIAGNOSIS — I10 Essential (primary) hypertension: Secondary | ICD-10-CM | POA: Diagnosis not present

## 2012-10-21 DIAGNOSIS — E119 Type 2 diabetes mellitus without complications: Secondary | ICD-10-CM | POA: Diagnosis not present

## 2012-10-21 DIAGNOSIS — IMO0001 Reserved for inherently not codable concepts without codable children: Secondary | ICD-10-CM | POA: Diagnosis not present

## 2012-10-21 DIAGNOSIS — M6281 Muscle weakness (generalized): Secondary | ICD-10-CM | POA: Diagnosis not present

## 2012-10-21 DIAGNOSIS — R262 Difficulty in walking, not elsewhere classified: Secondary | ICD-10-CM

## 2012-10-21 DIAGNOSIS — M25569 Pain in unspecified knee: Secondary | ICD-10-CM | POA: Diagnosis not present

## 2012-10-21 NOTE — Progress Notes (Signed)
Physical Therapy Treatment Patient Details  Name: [name] MRN: [id number] Date of Birth: 12-28-1940  Today's Date: 10/21/2012 Time:  1515- 1602    Visit#: 2 of 8  Re-eval: 11/18/12  there ex x 38'  Authorization: Medicare   Authorization Visit#: 2 of 8   Subjective: Symptoms/Limitations Symptoms: Pt states that she has been suffering with irritable bowel syndrome so she has not done her exercises like she should have. Pain Assessment Currently in Pain?: Yes Pain Score:   8 Pain Location: Knee Pain Orientation: Left Pain Type: Chronic pain   Exercise/Treatments   Aerobic Stationary Bike: nu-step x 8:00 Standing Heel Raises: 5 reps Knee Flexion: Strengthening;Left;10 reps Terminal Knee Extension: 10 reps Functional Squat: 10 reps Rocker Board: 2 minutes SLS: x5 max of 6 sseconds.  Other Standing Knee Exercises: hip abduction, extension 3# x 10 Seated Long Arc Quad: Left;15 reps;Weights Long Arc Quad Weight: 3 lbs.  Physical Therapy Assessment and Plan PT Assessment and Plan Clinical Impression Statement: Pt completed new exercises with verbal cuing and pt needed several rest breaks.  Pt had significant difficulty with heelraises needing to use B UE assist to complete exercise.  Pt will benefit from skilled therapeutic intervention in order to improve on the following deficits: Decreased activity tolerance;Decreased strength;Difficulty walking;Pain;Obesity PT Frequency: Min 2X/week PT Duration: 8 weeks PT Treatment/Interventions: Therapeutic activities;Therapeutic exercise;Balance training;Functional mobility training;Modalities;Manual techniques PT Plan: begin marching and minimal lunging exericises.progress to 2" step ups as able.    Goals PT Short Term Goals PT Short Term Goal 1: Pt to be able to walk for 15 minues  Problem List Patient Active Problem List   Diagnosis Date Noted  . Difficulty in walking 10/19/2012  . Weakness of left leg 10/19/2012  .  Long term (current) use of anticoagulants 09/26/2012  . Dysphagia 05/04/2012  . Acute renal failure 08/10/2011  . Microcytic anemia 08/10/2011  . Acute bronchitis 08/09/2011  . Acute respiratory failure 08/09/2011  . IBS (irritable bowel syndrome) 08/09/2011  . Elevated serum creatinine 08/09/2011  . Chest pressure 03/05/2011  . DOE (dyspnea on exertion) 03/05/2011  . Polymyalgia rheumatica 03/05/2011  . Anemia 03/05/2011  . Peripheral neuropathy 10/02/2010  . 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 HYPOTHYROIDISM 12/02/2008  . DM 12/02/2008  . HYPERLIPIDEMIA 12/02/2008  . HYPERTENSION 12/02/2008  . CORONARY ARTERY DISEASE 12/02/2008  . FIBRILLATION, ATRIAL 12/02/2008  . ASTHMA 12/02/2008  . GERD 12/02/2008  . TOBACCO USE, QUIT 12/02/2008    PT - End of Session Activity Tolerance: Patient tolerated treatment well General Behavior During Therapy: WFL for tasks assessed/performed Cognition: WFL for tasks performed  GP    RUSSELL,CINDY 10/21/2012, 3:59 PM

## 2012-10-23 ENCOUNTER — Ambulatory Visit (INDEPENDENT_AMBULATORY_CARE_PROVIDER_SITE_OTHER): Payer: Medicare Other | Admitting: Family Medicine

## 2012-10-23 ENCOUNTER — Encounter: Payer: Self-pay | Admitting: Family Medicine

## 2012-10-23 VITALS — BP 122/78 | HR 70 | Wt 239.0 lb

## 2012-10-23 DIAGNOSIS — M353 Polymyalgia rheumatica: Secondary | ICD-10-CM

## 2012-10-23 DIAGNOSIS — I4891 Unspecified atrial fibrillation: Secondary | ICD-10-CM

## 2012-10-23 DIAGNOSIS — E119 Type 2 diabetes mellitus without complications: Secondary | ICD-10-CM | POA: Diagnosis not present

## 2012-10-23 DIAGNOSIS — Z7901 Long term (current) use of anticoagulants: Secondary | ICD-10-CM | POA: Diagnosis not present

## 2012-10-23 DIAGNOSIS — Z79899 Other long term (current) drug therapy: Secondary | ICD-10-CM | POA: Diagnosis not present

## 2012-10-23 DIAGNOSIS — I10 Essential (primary) hypertension: Secondary | ICD-10-CM

## 2012-10-23 DIAGNOSIS — E782 Mixed hyperlipidemia: Secondary | ICD-10-CM

## 2012-10-23 DIAGNOSIS — E785 Hyperlipidemia, unspecified: Secondary | ICD-10-CM

## 2012-10-23 LAB — POCT INR: INR: 3.1

## 2012-10-23 NOTE — Progress Notes (Signed)
  Subjective:    Patient ID: [name], female    DOB: 1941/01/16, 72 y.o.   MRN: [id number]  HPI patient arrives office for followup of her many concerns. She continues to have challenges with her asthma, though recently things have been under good control in this regard. Rare challenge with wheezing. Ongoing difficulties with joint pain. She basically fired her prior rheumatologist analysis that we keep her on prednisone. I have been reluctant to do this but the patient starts crying every time to try to stop the medicine. She realizes that it has potential effects which could cause her great horn. Patient reports her sugars have been in good control lately. She is trying to watch her diet. She claims compliance with all of her medications. She is not exercising regularly. She has ongoing difficulties with chronic pain. Primarily in the back and knees.    Review of Systems Review systems no weight loss weight gain. Otherwise normal.    Objective:   Physical Exam   Results for orders placed in visit on 10/23/12  POCT INR      Result Value Range   INR 3.1    POCT INR      Result Value Range   INR 3.1     Alert distinct obesity noted. Lungs clear today heart regular in rhythm. Ankles trace edema. Decent pulses. Sensation today is intact.     Assessment & Plan:  Impression #1 type 2 diabetes recent decent control. #2 polymyalgia rheumatica. Patient reports ongoing need to take prednisone but agrees to decrease dose to 5 mg. #3 coronary artery disease clinically stable. #4 hyperlipidemia. #5 chronic pain secondary to polymyalgia rheumatica and arthritis. Plan pain medicines refilled. Decrease prednisone to 5 daily

## 2012-10-26 ENCOUNTER — Ambulatory Visit (HOSPITAL_COMMUNITY)
Admission: RE | Admit: 2012-10-26 | Discharge: 2012-10-26 | Disposition: A | Discharge status: Home / Self Care | Payer: Medicare Other | Source: Ambulatory Visit | Attending: Orthopedic Surgery | Admitting: Orthopedic Surgery

## 2012-10-26 DIAGNOSIS — E119 Type 2 diabetes mellitus without complications: Secondary | ICD-10-CM | POA: Insufficient documentation

## 2012-10-26 DIAGNOSIS — IMO0001 Reserved for inherently not codable concepts without codable children: Secondary | ICD-10-CM | POA: Insufficient documentation

## 2012-10-26 DIAGNOSIS — M6281 Muscle weakness (generalized): Secondary | ICD-10-CM | POA: Diagnosis not present

## 2012-10-26 DIAGNOSIS — M25569 Pain in unspecified knee: Secondary | ICD-10-CM | POA: Diagnosis not present

## 2012-10-26 DIAGNOSIS — R262 Difficulty in walking, not elsewhere classified: Secondary | ICD-10-CM | POA: Diagnosis not present

## 2012-10-26 DIAGNOSIS — I10 Essential (primary) hypertension: Secondary | ICD-10-CM | POA: Diagnosis not present

## 2012-10-26 NOTE — Progress Notes (Signed)
Physical Therapy Treatment Patient Details  Name: [name] MRN: [id number] Date of Birth: 04/27/1941  Today's Date: 10/26/2012 Time: 1520-1600 PT Time Calculation (min): 40 min  Visit#: 3 of 8  Re-eval: 11/18/12 Charges: Therex x 38'  Authorization: Medicare  Authorization Visit#: 3 of 8   Subjective: Symptoms/Limitations Symptoms: Pt states that she was sore after last session. She states she is feeling better now. Pain Assessment Currently in Pain?: Yes Pain Score:   3 Pain Location: Knee Pain Orientation: Left   Exercise/Treatments Aerobic Stationary Bike: NuStep L1 x 10' seat 8 Standing Heel Raises: 10 reps Knee Flexion: 10 reps Terminal Knee Extension: 10 reps SLS: LLE 3" max of 5 Seated Other Seated Knee Exercises: Sit to stand from 23" surface x 5 in place of functional squats.  Physical Therapy Assessment and Plan PT Assessment and Plan Clinical Impression Statement: Pt requires moderate vc's to encourage patient and improve confidence with standing tasks. Pt unable to complete functional squats properly. Exercises was modified and completed at 23" surface. Pt displays improved ankle strategy with heel raises. Pt will benefit from skilled therapeutic intervention in order to improve on the following deficits: Decreased activity tolerance;Decreased strength;Difficulty walking;Pain;Obesity PT Frequency: Min 2X/week PT Duration: 8 weeks PT Treatment/Interventions: Therapeutic activities;Therapeutic exercise;Balance training;Functional mobility training;Modalities;Manual techniques PT Plan: Begin marching and minimal lunging exericises. Progress to 2" step ups as able.     Problem List Patient Active Problem List   Diagnosis Date Noted  . Difficulty in walking 10/19/2012  . Weakness of left leg 10/19/2012  . Long term (current) use of anticoagulants 09/26/2012  . Dysphagia 05/04/2012  . Acute renal failure 08/10/2011  . Microcytic anemia 08/10/2011  .  Acute bronchitis 08/09/2011  . Acute respiratory failure 08/09/2011  . IBS (irritable bowel syndrome) 08/09/2011  . Elevated serum creatinine 08/09/2011  . Chest pressure 03/05/2011  . DOE (dyspnea on exertion) 03/05/2011  . Polymyalgia rheumatica 03/05/2011  . Anemia 03/05/2011  . Peripheral neuropathy 10/02/2010  . 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 HYPOTHYROIDISM 12/02/2008  . DM 12/02/2008  . HYPERLIPIDEMIA 12/02/2008  . HYPERTENSION 12/02/2008  . CORONARY ARTERY DISEASE 12/02/2008  . FIBRILLATION, ATRIAL 12/02/2008  . ASTHMA 12/02/2008  . GERD 12/02/2008  . TOBACCO USE, QUIT 12/02/2008    PT - End of Session Activity Tolerance: Patient tolerated treatment well General Behavior During Therapy: [facility] for tasks assessed/performed Cognition: WFL for tasks performed  Rachelle Hora, PTA 10/26/2012, 5:45 PM

## 2012-10-28 ENCOUNTER — Other Ambulatory Visit: Payer: Self-pay | Admitting: Family Medicine

## 2012-10-28 ENCOUNTER — Ambulatory Visit (HOSPITAL_COMMUNITY)
Admission: RE | Admit: 2012-10-28 | Discharge: 2012-10-28 | Disposition: A | Discharge status: Home / Self Care | Payer: Medicare Other | Source: Ambulatory Visit | Attending: Orthopedic Surgery | Admitting: Orthopedic Surgery

## 2012-10-28 DIAGNOSIS — IMO0001 Reserved for inherently not codable concepts without codable children: Secondary | ICD-10-CM | POA: Diagnosis not present

## 2012-10-28 DIAGNOSIS — Z139 Encounter for screening, unspecified: Secondary | ICD-10-CM

## 2012-10-28 DIAGNOSIS — M25569 Pain in unspecified knee: Secondary | ICD-10-CM | POA: Diagnosis not present

## 2012-10-28 DIAGNOSIS — R262 Difficulty in walking, not elsewhere classified: Secondary | ICD-10-CM | POA: Diagnosis not present

## 2012-10-28 DIAGNOSIS — E119 Type 2 diabetes mellitus without complications: Secondary | ICD-10-CM | POA: Diagnosis not present

## 2012-10-28 DIAGNOSIS — I10 Essential (primary) hypertension: Secondary | ICD-10-CM | POA: Diagnosis not present

## 2012-10-28 DIAGNOSIS — M6281 Muscle weakness (generalized): Secondary | ICD-10-CM | POA: Diagnosis not present

## 2012-10-28 NOTE — Progress Notes (Signed)
Physical Therapy Treatment Patient Details  Name: [name] MRN: [id number] Date of Birth: June 05, 1941  Today's Date: 10/28/2012 Time: 1518-1600 PT Time Calculation (min): 42 min  Visit#: 4 of 8  Re-eval: 11/18/12 Charges: Therex x 38'  Authorization: Medicare  Authorization Visit#: 4 of 8   Subjective: Symptoms/Limitations Symptoms: Pt states that her pain is elevated this session but she does not know why. Pt reports continued HEP compliance. Pain Assessment Currently in Pain?: Yes Pain Score:   8 Pain Location: Knee Pain Orientation: Left   Exercise/Treatments Aerobic Stationary Bike: NuStep L1 x 12' seat 8 Standing Heel Raises:  (Unable secondary to pain in R heel with activity) Knee Flexion: 10 reps Rocker Board: Limitations Rocker Board Limitations: Completed 10 reps before stopping secondary to pain Gait Training: From therpay dept to car. Working on confidence and activity tolerace on slope.  Seated Other Seated Knee Exercises: Sit to stand from standard surface x 5 without UE assistance   Manual Therapy Manual Therapy: Joint mobilization Joint Mobilization: Grade I-II A/P joint mobs completed to left knee to decrease pain  Physical Therapy Assessment and Plan PT Assessment and Plan Clinical Impression Statement: Tx is limited by pain. Pt has pain with standing activities. Completed Grade I-II joint mobs to left knee to decrease pain. Pt requires multimodal cueing to complete sit to stand with proper form. Pt reports decrease pain at end of session. Pt will benefit from skilled therapeutic intervention in order to improve on the following deficits: Decreased activity tolerance;Decreased strength;Difficulty walking;Pain;Obesity PT Frequency: Min 2X/week PT Duration: 8 weeks PT Treatment/Interventions: Therapeutic activities;Therapeutic exercise;Balance training;Functional mobility training;Modalities;Manual techniques PT Plan: Continue strengthening activities  as pain allows.     Problem List Patient Active Problem List   Diagnosis Date Noted  . Difficulty in walking 10/19/2012  . Weakness of left leg 10/19/2012  . Long term (current) use of anticoagulants 09/26/2012  . Dysphagia 05/04/2012  . Acute renal failure 08/10/2011  . Microcytic anemia 08/10/2011  . Acute bronchitis 08/09/2011  . Acute respiratory failure 08/09/2011  . IBS (irritable bowel syndrome) 08/09/2011  . Elevated serum creatinine 08/09/2011  . Chest pressure 03/05/2011  . DOE (dyspnea on exertion) 03/05/2011  . Polymyalgia rheumatica 03/05/2011  . Anemia 03/05/2011  . Peripheral neuropathy 10/02/2010  . 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 HYPOTHYROIDISM 12/02/2008  . DM 12/02/2008  . HYPERLIPIDEMIA 12/02/2008  . HYPERTENSION 12/02/2008  . CORONARY ARTERY DISEASE 12/02/2008  . FIBRILLATION, ATRIAL 12/02/2008  . ASTHMA 12/02/2008  . GERD 12/02/2008  . TOBACCO USE, QUIT 12/02/2008    PT - End of Session Activity Tolerance: Patient tolerated treatment well;Patient limited by pain General Behavior During Therapy: [facility] for tasks assessed/performed Cognition: [facility] for tasks performed  Rachelle Hora, PTA  10/28/2012, 4:57 PM

## 2012-10-29 DIAGNOSIS — M1710 Unilateral primary osteoarthritis, unspecified knee: Secondary | ICD-10-CM | POA: Diagnosis not present

## 2012-10-29 DIAGNOSIS — IMO0002 Reserved for concepts with insufficient information to code with codable children: Secondary | ICD-10-CM | POA: Diagnosis not present

## 2012-11-02 ENCOUNTER — Ambulatory Visit (HOSPITAL_COMMUNITY): Payer: Medicare Other | Admitting: Physical Therapy

## 2012-11-02 ENCOUNTER — Telehealth (HOSPITAL_COMMUNITY): Payer: Self-pay

## 2012-11-03 ENCOUNTER — Telehealth: Payer: Self-pay | Admitting: Family Medicine

## 2012-11-03 NOTE — Telephone Encounter (Signed)
Fibro pain is worse, not sleeping at night.  Patient would like to know if the Oxycodone can be increased from 5mg ?   Please call patient.

## 2012-11-03 NOTE — Telephone Encounter (Signed)
We do not increase chronic narcotics over the phone. Will need ov

## 2012-11-04 ENCOUNTER — Telehealth (HOSPITAL_COMMUNITY): Payer: Self-pay

## 2012-11-04 ENCOUNTER — Ambulatory Visit: Payer: Medicare Other | Admitting: Family Medicine

## 2012-11-04 ENCOUNTER — Inpatient Hospital Stay (HOSPITAL_COMMUNITY): Admission: RE | Admit: 2012-11-04 | Payer: Medicare Other | Source: Ambulatory Visit | Admitting: *Deleted

## 2012-11-04 ENCOUNTER — Encounter (INDEPENDENT_AMBULATORY_CARE_PROVIDER_SITE_OTHER): Payer: Self-pay | Admitting: *Deleted

## 2012-11-04 NOTE — Telephone Encounter (Signed)
Appointment made

## 2012-11-05 ENCOUNTER — Ambulatory Visit: Payer: Medicare Other | Admitting: Family Medicine

## 2012-11-05 ENCOUNTER — Ambulatory Visit (INDEPENDENT_AMBULATORY_CARE_PROVIDER_SITE_OTHER): Payer: Medicare Other | Admitting: Family Medicine

## 2012-11-05 ENCOUNTER — Encounter: Payer: Self-pay | Admitting: Family Medicine

## 2012-11-05 VITALS — BP 140/70 | Temp 97.9°F | Ht 63.0 in | Wt 238.2 lb

## 2012-11-05 DIAGNOSIS — M353 Polymyalgia rheumatica: Secondary | ICD-10-CM

## 2012-11-05 DIAGNOSIS — M1710 Unilateral primary osteoarthritis, unspecified knee: Secondary | ICD-10-CM | POA: Diagnosis not present

## 2012-11-05 DIAGNOSIS — IMO0002 Reserved for concepts with insufficient information to code with codable children: Secondary | ICD-10-CM | POA: Diagnosis not present

## 2012-11-05 NOTE — Progress Notes (Signed)
  Subjective:    Patient ID: [name], female    DOB: 01/23/1941, 72 y.o.   MRN: [id number]  HPI  Patient arrives office with progressive difficulties. More aching in the muscles. More aching in the joints. No fever no chills. Recalls no injury. Claims compliance with her oxycodone. Takes a 5 mg tablet twice a day. Often adds to regular strength Tylenol to this. In addition patient is on prednisone. She has backed down her dose. She wonders if this may be related to her pain.  Review of Systems    no chest pain no abdominal pain baseline wheeziness with exertion otherwise negative Objective:   Physical Exam  Alert no acute distress. Vitals reviewed. Lungs clear. Heart regular in rhythm. Ankles without edema. No hot inflamed joints.      Assessment & Plan:  Impression #1 polymyalgia rheumatica. Discussed. #2 fibromyalgia a challenge. #3 arthritis all 3 of these contribute to the pain plan increase oxycodone to 5 3 times a day rationale discussed. #90 written. May add plain Tylenol to this. Rheumatology consult. WSL

## 2012-11-09 ENCOUNTER — Ambulatory Visit (HOSPITAL_COMMUNITY)
Admission: RE | Admit: 2012-11-09 | Discharge: 2012-11-09 | Disposition: A | Discharge status: Home / Self Care | Payer: Medicare Other | Source: Ambulatory Visit | Attending: Orthopedic Surgery | Admitting: Orthopedic Surgery

## 2012-11-09 DIAGNOSIS — I10 Essential (primary) hypertension: Secondary | ICD-10-CM | POA: Diagnosis not present

## 2012-11-09 DIAGNOSIS — M25569 Pain in unspecified knee: Secondary | ICD-10-CM | POA: Diagnosis not present

## 2012-11-09 DIAGNOSIS — E119 Type 2 diabetes mellitus without complications: Secondary | ICD-10-CM | POA: Diagnosis not present

## 2012-11-09 DIAGNOSIS — R262 Difficulty in walking, not elsewhere classified: Secondary | ICD-10-CM | POA: Diagnosis not present

## 2012-11-09 DIAGNOSIS — IMO0001 Reserved for inherently not codable concepts without codable children: Secondary | ICD-10-CM | POA: Diagnosis not present

## 2012-11-09 DIAGNOSIS — M6281 Muscle weakness (generalized): Secondary | ICD-10-CM | POA: Diagnosis not present

## 2012-11-09 NOTE — Evaluation (Signed)
Physical Therapy Discharge Summary  Patient Details  Name: [name] MRN: [id number] Date of Birth: 1941-06-12  Today's Date: 11/09/2012 Time: 1432-1510 PT Time Calculation (min): 38 min              Visit#: 5 of 8  Re-eval: 11/18/12 Charges: MMT x 1 ROMM x 1 Self care x 20'  Authorization: Medicare    Authorization Visit#: 5 of 8   Past Medical History:  Past Medical History  Diagnosis Date  . ASCVD (arteriosclerotic cardiovascular disease)     stent to mid and proximal left anterior descending in 06/2002;drug eluting stent placed in the second diagnol in 08/2003 after  A  non-st elevation myocardial infarction   . Tobacco user     stopped   . Hyperlipidemia     pulmonary embolism 2000 and 09/2008  . Hypertension   . Pulmonary embolism     2000/09/2008  . Thyroid disease     hypothyroidism  . Allergy history unknown   . GERD (gastroesophageal reflux disease)   . Diabetes mellitus     no insulin  . Asthma   . FH: colonic polyps     adenomataous  . Anticoagulated   . Diarrhea     acute  . Rectal bleeding   . Pedal edema   . Irritable bowel syndrome   . Coronary artery disease   . Paroxysmal atrial fibrillation     normal LV function; episodes occurred in 205 and 09/2007  . Hypothyroidism   . Peripheral edema   . Allergy   . Atrial fibrillation   . Chronic anticoagulation   . Venous stasis   . Insomnia    Past Surgical History:  Past Surgical History  Procedure Laterality Date  . Cleft palate repair      4 surgeries  . Abdominal hysterectomy    . Appendectomy    . Cholecystectomy    . Heel spur surgery    . Knee surgery    . Breast reduction surgery    . Colonoscopy  2011    negative  . Oophorectomy    . Breast surgery      Subjective Symptoms/Limitations Symptoms: Pt states that she shot a shot in her knee and she is pain free. She feels that she may be ready for discharge. Pain Assessment Currently in Pain?:  No/denies   Sensation/Coordination/Flexibility/Functional Tests Functional Tests Functional Tests: LEFS 61/80  Assessment LLE AROM (degrees) Left Knee Extension: 0 Left Knee Flexion: 125 LLE Strength Left Hip Flexion: 4/5 (was 2/5) Left Hip Extension: 5/5 (was 2/5 ) Left Hip ABduction:  (4+/5 was 3+/5) Left Knee Flexion: 5/5 (was 3/5) Left Knee Extension: 5/5 (was 3/5) Left Ankle Dorsiflexion: 4/5 (was 3/5)  Exercise/Treatments Aerobic Stationary Bike: NuStep L1 x 10' seat 8  Physical Therapy Assessment and Plan PT Assessment and Plan Clinical Impression Statement: Pt states that she had a shot in her knee last week. She is now pain free. She states that she feels comfortable completing exercises at home and is ready for discharge. Strength, ROM , and functional independence have all significantly improved. Pt will benefit from skilled therapeutic intervention in order to improve on the following deficits: Decreased activity tolerance;Decreased strength;Difficulty walking;Pain;Obesity PT Frequency: Min 2X/week PT Duration: 8 weeks PT Treatment/Interventions: Therapeutic activities;Therapeutic exercise;Balance training;Functional mobility training;Modalities;Manual techniques PT Plan: Recommend D/C to HEP.    Goals PT Short Term Goals PT Short Term Goal 1: Pt to be able to walk for 15 minues PT  Short Term Goal 1 - Progress: Met PT Short Term Goal 2: Pt pain to be no greater than a 5/10 PT Short Term Goal 2 - Progress: Met PT Long Term Goals PT Long Term Goal 1: pt I in advance HEP PT Long Term Goal 1 - Progress: Met PT Long Term Goal 2: pt strength to be at least a 4/5 to allow sitting and sit to stand to be easier and to be able come sit to stand on the first attempt. Long Term Goal 3: Pain no greater than a 3/10 80% of the day Long Term Goal 3 Progress: Met Long Term Goal 4: LE not to give away for 2 weeks. (Has not gien way in 1 week) Long Term Goal 4 Progress:  Progressing toward goal PT Long Term Goal 5: LEFS improved by 10   Problem List Patient Active Problem List   Diagnosis Date Noted  . Difficulty in walking 10/19/2012  . Weakness of left leg 10/19/2012  . Long term (current) use of anticoagulants 09/26/2012  . Dysphagia 05/04/2012  . Acute renal failure 08/10/2011  . Microcytic anemia 08/10/2011  . Acute bronchitis 08/09/2011  . Acute respiratory failure 08/09/2011  . IBS (irritable bowel syndrome) 08/09/2011  . Elevated serum creatinine 08/09/2011  . Chest pressure 03/05/2011  . DOE (dyspnea on exertion) 03/05/2011  . Polymyalgia rheumatica 03/05/2011  . Anemia 03/05/2011  . Peripheral neuropathy 10/02/2010  . 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 HYPOTHYROIDISM 12/02/2008  . DM 12/02/2008  . HYPERLIPIDEMIA 12/02/2008  . HYPERTENSION 12/02/2008  . CORONARY ARTERY DISEASE 12/02/2008  . FIBRILLATION, ATRIAL 12/02/2008  . ASTHMA 12/02/2008  . GERD 12/02/2008  . TOBACCO USE, QUIT 12/02/2008    PT - End of Session Activity Tolerance: Patient tolerated treatment well;Patient limited by pain General Behavior During Therapy: WFL for tasks assessed/performed Cognition: WFL for tasks performed PT Plan of Care PT Patient Instructions: Educated pt on the importance of completing HEP even when she is pain free.  GP Functional Assessment Tool Used: LEFS Functional Limitation: Mobility: Walking and moving around Mobility: Walking and Moving Around Goal Status [phone]): At least 20 percent but less than 40 percent impaired, limited or restricted Mobility: Walking and Moving Around Discharge Status [phone]): At least 20 percent but less than 40 percent impaired, limited or restricted  Rachelle Hora, PTA  11/09/2012, 4:00 PM  Physician Documentation Your signature is required to indicate approval of the treatment plan as stated above.  Please sign and either send electronically or make a copy of  this report for your files and return this physician signed original.   Please mark one 1.__approve of plan  2. ___approve of plan with the following conditions.   ______________________________                                                          _____________________ Physician Signature  Date  

## 2012-11-12 DIAGNOSIS — M1710 Unilateral primary osteoarthritis, unspecified knee: Secondary | ICD-10-CM | POA: Diagnosis not present

## 2012-11-18 ENCOUNTER — Encounter (INDEPENDENT_AMBULATORY_CARE_PROVIDER_SITE_OTHER): Payer: Self-pay | Admitting: Internal Medicine

## 2012-11-18 ENCOUNTER — Ambulatory Visit (INDEPENDENT_AMBULATORY_CARE_PROVIDER_SITE_OTHER): Payer: Medicare Other | Admitting: Internal Medicine

## 2012-11-18 VITALS — BP 138/72 | HR 80 | Ht 61.0 in | Wt 237.5 lb

## 2012-11-18 DIAGNOSIS — K5900 Constipation, unspecified: Secondary | ICD-10-CM

## 2012-11-18 DIAGNOSIS — K219 Gastro-esophageal reflux disease without esophagitis: Secondary | ICD-10-CM

## 2012-11-18 DIAGNOSIS — R197 Diarrhea, unspecified: Secondary | ICD-10-CM | POA: Diagnosis not present

## 2012-11-18 MED ORDER — DICYCLOMINE HCL 10 MG PO CAPS: 10.0000 mg | ORAL_CAPSULE | Freq: Two times a day (BID) | ORAL | Status: DC

## 2012-11-18 MED ORDER — OMEPRAZOLE 20 MG PO CPDR: DELAYED_RELEASE_CAPSULE | ORAL | Status: DC

## 2012-11-18 NOTE — Progress Notes (Addendum)
Subjective:     Patient ID: [name], female   DOB: 1940-07-26, 72 y.o.   MRN: [id number]  HPI [name] is a 72 yr old female here today for a scheduled visit. She was las seen in December of 2012 for intermittent diarrhea.  She had c/o dysphagia. She underwent a Barium Swallow test which revealed: IMPRESSION:  Moderate diffuse impairment of esophageal motility.  No evidence of esophageal mass, stricture, or obstruction.  Significant gastroesophageal reflux.  Small hiatal hernia. She tells me sometimes when she swallows the food will come back.  She does have acid reflux. She know what foods cause acid reflux and she avoid them.  She doesn't eat late before she goes to bed.  No foods are lodging in her esophagus.  She has intermittent diarrhea. She will either have constipation or diarrhea.  She usually has diarrhea 2-3 times a weeks and takes Lomotil for this. She will take Lomotil (2) in the am.  She has constipation at least 1-2 times a week.  Her appetitie is not good. She says she is not every hungry.  She has lost 12 pounds since her last visit in November. Here last EGD was in December of 2010 for dysphagia.     Review of Systems see hpi Current Outpatient Prescriptions  Medication Sig Dispense Refill  . albuterol (PROAIR HFA) 108 (90 BASE) MCG/ACT inhaler Inhale 2 puffs into the lungs every 4 (four) hours as needed. For shortness of breath       . Cholecalciferol (VITAMIN D3) 2000 UNITS TABS Take 2 tablets by mouth daily.       . citalopram (CELEXA) 40 MG tablet TAKE 1 TABLET ONCE DAILY.  30 tablet  5  . dicyclomine (BENTYL) 10 MG capsule Take 10 mg by mouth 2 (two) times daily.      Marland Kitchen diltiazem (CARDIZEM CD) 300 MG 24 hr capsule Take 1 capsule (300 mg total) by mouth daily.  30 capsule  11  . DIOVAN 320 MG tablet TAKE (1) TABLET BY MOUTH ONCE DAILY.  30 tablet  6  . diphenoxylate-atropine (LOMOTIL) 2.5-0.025 MG per tablet Take 1 tablet by mouth 2 (two) times daily as needed for  diarrhea or loose stools. Patient states that she takes this medication 1-2 by mouth as needed for diarrhea  60 tablet  2  . fluticasone (FLOVENT HFA) 44 MCG/ACT inhaler Inhale 2 puffs into the lungs as needed.       . furosemide (LASIX) 80 MG tablet Take 80 mg by mouth daily.      Marland Kitchen levothyroxine (SYNTHROID, LEVOTHROID) 50 MCG tablet Take 50 mcg by mouth daily.      . montelukast (SINGULAIR) 10 MG tablet Take 10 mg by mouth every evening.       Marland Kitchen oxycodone (OXY-IR) 5 MG capsule Take 5 mg by mouth every 4 (four) hours as needed. Takes three times a day      . potassium chloride SA (K-DUR,KLOR-CON) 20 MEQ tablet Take 20 mEq by mouth as needed.       . pravastatin (PRAVACHOL) 40 MG tablet Take 40 mg by mouth daily.      . predniSONE (DELTASONE) 10 MG tablet Take 5 mg by mouth daily.       Marland Kitchen warfarin (COUMADIN) 5 MG tablet Take 5 mg by mouth every evening. 5 mg daily and 7.5 mg every Saturday      . omeprazole (PRILOSEC) 20 MG capsule TAKE 1 CAPSULE BY MOUTH TWICE DAILY.  60 capsule  11  . [DISCONTINUED] ferrous sulfate 325 (65 FE) MG tablet Take 1 tablet (325 mg total) by mouth daily with breakfast. IRON SUPPLEMENT.      . [DISCONTINUED] glyBURIDE (DIABETA) 5 MG tablet Take 2.5 mg by mouth daily with breakfast.       . [DISCONTINUED] ipratropium (ATROVENT) 0.02 % nebulizer solution Take 500 mcg by nebulization every 4 (four) hours as needed. For shortness of breath       No current facility-administered medications for this visit.   Past Medical History  Diagnosis Date  . ASCVD (arteriosclerotic cardiovascular disease)     stent to mid and proximal left anterior descending in 06/2002;drug eluting stent placed in the second diagnol in 08/2003 after  A  non-st elevation myocardial infarction   . Tobacco user     stopped   . Hyperlipidemia     pulmonary embolism 2000 and 09/2008  . Hypertension   . Pulmonary embolism     2000/09/2008  . Thyroid disease     hypothyroidism  . Allergy history  unknown   . GERD (gastroesophageal reflux disease)   . Diabetes mellitus     no insulin  . Asthma   . FH: colonic polyps     adenomataous  . Anticoagulated   . Diarrhea     acute  . Rectal bleeding   . Pedal edema   . Irritable bowel syndrome   . Coronary artery disease   . Paroxysmal atrial fibrillation     normal LV function; episodes occurred in 205 and 09/2007  . Hypothyroidism   . Peripheral edema   . Allergy   . Atrial fibrillation   . Chronic anticoagulation   . Venous stasis   . Insomnia    Past Surgical History  Procedure Laterality Date  . Cleft palate repair      4 surgeries  . Abdominal hysterectomy    . Appendectomy    . Cholecystectomy    . Heel spur surgery    . Knee surgery    . Breast reduction surgery    . Colonoscopy  2011    negative  . Oophorectomy    . Breast surgery     Allergies  Allergen Reactions  . Demerol Nausea And Vomiting        Objective:   Physical Exam  Filed Vitals:   11/18/12 1409  BP: 138/72  Pulse: 80  Height: 5\' 1"  (1.549 m)  Weight: 237 lb 8 oz (107.729 kg)   Alert and oriented. Skin warm and dry. Oral mucosa is moist.   . Sclera anicteric, conjunctivae is pink. Thyroid not enlarged. No cervical lymphadenopathy. Lungs clear. Heart regular rate and rhythm.  Abdomen is soft. Bowel sounds are positive. No hepatomegaly. No abdominal masses felt. No tenderness.  No edema to lower extremities.      Assessment:   GERD. For the most part is controlled by diet. No dysphagia. She is chewing her foods well. IBS diarrhea/ constipation: She takes Lomotil on a prn basis.      Plan:    Continue present medications. If any problems please call our office. Refills on Omeprazole and Dicyclomine

## 2012-11-18 NOTE — Patient Instructions (Addendum)
Continue present medications. She needs to start the Prilosec back. Separate ot from the Coumadin OV in 6 months

## 2012-11-19 ENCOUNTER — Other Ambulatory Visit: Payer: Self-pay | Admitting: Cardiology

## 2012-11-19 ENCOUNTER — Other Ambulatory Visit: Payer: Self-pay | Admitting: Family Medicine

## 2012-11-21 ENCOUNTER — Other Ambulatory Visit: Payer: Self-pay | Admitting: *Deleted

## 2012-11-21 DIAGNOSIS — Z7901 Long term (current) use of anticoagulants: Secondary | ICD-10-CM

## 2012-11-24 ENCOUNTER — Ambulatory Visit: Payer: Medicare Other

## 2012-11-24 NOTE — Telephone Encounter (Signed)
Refill request received for Diovan and this was denied.  This pt has not been seen in the office since 2012.  The pt should contact our office for an appt or request refills through PCP.

## 2012-11-26 ENCOUNTER — Encounter (HOSPITAL_COMMUNITY): Payer: Self-pay | Admitting: Emergency Medicine

## 2012-11-26 ENCOUNTER — Emergency Department (HOSPITAL_COMMUNITY): Payer: Medicare Other

## 2012-11-26 ENCOUNTER — Emergency Department (HOSPITAL_COMMUNITY)
Admission: EM | Admit: 2012-11-26 | Discharge: 2012-11-26 | Disposition: A | Discharge status: Home / Self Care | Payer: Medicare Other | Attending: Emergency Medicine | Admitting: Emergency Medicine

## 2012-11-26 ENCOUNTER — Ambulatory Visit (HOSPITAL_COMMUNITY): Payer: Medicare Other

## 2012-11-26 DIAGNOSIS — Z860100 Personal history of colon polyps, unspecified: Secondary | ICD-10-CM | POA: Insufficient documentation

## 2012-11-26 DIAGNOSIS — M25569 Pain in unspecified knee: Secondary | ICD-10-CM | POA: Insufficient documentation

## 2012-11-26 DIAGNOSIS — K219 Gastro-esophageal reflux disease without esophagitis: Secondary | ICD-10-CM | POA: Diagnosis not present

## 2012-11-26 DIAGNOSIS — Z8679 Personal history of other diseases of the circulatory system: Secondary | ICD-10-CM | POA: Diagnosis not present

## 2012-11-26 DIAGNOSIS — I4891 Unspecified atrial fibrillation: Secondary | ICD-10-CM | POA: Diagnosis not present

## 2012-11-26 DIAGNOSIS — Z79899 Other long term (current) drug therapy: Secondary | ICD-10-CM | POA: Diagnosis not present

## 2012-11-26 DIAGNOSIS — S30861A Insect bite (nonvenomous) of abdominal wall, initial encounter: Secondary | ICD-10-CM

## 2012-11-26 DIAGNOSIS — E119 Type 2 diabetes mellitus without complications: Secondary | ICD-10-CM | POA: Insufficient documentation

## 2012-11-26 DIAGNOSIS — IMO0002 Reserved for concepts with insufficient information to code with codable children: Secondary | ICD-10-CM | POA: Diagnosis not present

## 2012-11-26 DIAGNOSIS — Z87891 Personal history of nicotine dependence: Secondary | ICD-10-CM | POA: Diagnosis not present

## 2012-11-26 DIAGNOSIS — M25559 Pain in unspecified hip: Secondary | ICD-10-CM | POA: Diagnosis not present

## 2012-11-26 DIAGNOSIS — G4700 Insomnia, unspecified: Secondary | ICD-10-CM | POA: Diagnosis not present

## 2012-11-26 DIAGNOSIS — M1710 Unilateral primary osteoarthritis, unspecified knee: Secondary | ICD-10-CM | POA: Diagnosis not present

## 2012-11-26 DIAGNOSIS — M79609 Pain in unspecified limb: Secondary | ICD-10-CM | POA: Diagnosis not present

## 2012-11-26 DIAGNOSIS — Y9389 Activity, other specified: Secondary | ICD-10-CM | POA: Insufficient documentation

## 2012-11-26 DIAGNOSIS — R5381 Other malaise: Secondary | ICD-10-CM | POA: Diagnosis not present

## 2012-11-26 DIAGNOSIS — G8929 Other chronic pain: Secondary | ICD-10-CM | POA: Insufficient documentation

## 2012-11-26 DIAGNOSIS — E785 Hyperlipidemia, unspecified: Secondary | ICD-10-CM | POA: Insufficient documentation

## 2012-11-26 DIAGNOSIS — E039 Hypothyroidism, unspecified: Secondary | ICD-10-CM | POA: Insufficient documentation

## 2012-11-26 DIAGNOSIS — J45909 Unspecified asthma, uncomplicated: Secondary | ICD-10-CM | POA: Diagnosis not present

## 2012-11-26 DIAGNOSIS — Z8719 Personal history of other diseases of the digestive system: Secondary | ICD-10-CM | POA: Insufficient documentation

## 2012-11-26 DIAGNOSIS — I2510 Atherosclerotic heart disease of native coronary artery without angina pectoris: Secondary | ICD-10-CM | POA: Insufficient documentation

## 2012-11-26 DIAGNOSIS — I10 Essential (primary) hypertension: Secondary | ICD-10-CM | POA: Insufficient documentation

## 2012-11-26 DIAGNOSIS — R404 Transient alteration of awareness: Secondary | ICD-10-CM | POA: Diagnosis not present

## 2012-11-26 DIAGNOSIS — Z9089 Acquired absence of other organs: Secondary | ICD-10-CM | POA: Diagnosis not present

## 2012-11-26 DIAGNOSIS — Z7901 Long term (current) use of anticoagulants: Secondary | ICD-10-CM | POA: Insufficient documentation

## 2012-11-26 DIAGNOSIS — W57XXXA Bitten or stung by nonvenomous insect and other nonvenomous arthropods, initial encounter: Secondary | ICD-10-CM | POA: Insufficient documentation

## 2012-11-26 DIAGNOSIS — Z86711 Personal history of pulmonary embolism: Secondary | ICD-10-CM | POA: Diagnosis not present

## 2012-11-26 DIAGNOSIS — Z90710 Acquired absence of both cervix and uterus: Secondary | ICD-10-CM | POA: Insufficient documentation

## 2012-11-26 DIAGNOSIS — Y9289 Other specified places as the place of occurrence of the external cause: Secondary | ICD-10-CM | POA: Insufficient documentation

## 2012-11-26 DIAGNOSIS — S30860A Insect bite (nonvenomous) of lower back and pelvis, initial encounter: Secondary | ICD-10-CM | POA: Diagnosis not present

## 2012-11-26 DIAGNOSIS — R52 Pain, unspecified: Secondary | ICD-10-CM | POA: Diagnosis not present

## 2012-11-26 DIAGNOSIS — R5383 Other fatigue: Secondary | ICD-10-CM | POA: Diagnosis not present

## 2012-11-26 HISTORY — DX: Other chronic pain: G89.29

## 2012-11-26 HISTORY — DX: Pain in left knee: M25.562

## 2012-11-26 MED ORDER — MORPHINE SULFATE 2 MG/ML IJ SOLN
2.0000 mg | Freq: Once | INTRAMUSCULAR | Status: AC
  Administered 2012-11-26: 2 mg via INTRAMUSCULAR
  Filled 2012-11-26: qty 1

## 2012-11-26 MED ORDER — METHOCARBAMOL 500 MG PO TABS: 500.0000 mg | ORAL_TABLET | Freq: Two times a day (BID) | ORAL | Status: DC

## 2012-11-26 MED ORDER — DOXYCYCLINE HYCLATE 100 MG PO TABS: 100.0000 mg | ORAL_TABLET | Freq: Two times a day (BID) | ORAL | Status: DC

## 2012-11-26 NOTE — ED Notes (Signed)
Patient arrives via EMS from home with c/o left leg pain. Denies injury. States she is unable to bear weight on leg.

## 2012-11-26 NOTE — ED Provider Notes (Signed)
History     CSN: [id number]  Arrival date & time 11/26/12  1653   First MD Initiated Contact with Patient 11/26/12 1740      Chief Complaint  Patient presents with  . Leg Pain     HPI Pt was seen at 1755.  Per pt, c/o gradual onset and persistence of constant acute flair of her chronic left leg "pain" for the past 3 years, worse over the past several days. Describes the pain as per her usual chronic pain pattern. Pain worsens with palpation of the area and movement of her left hip and knee. Pt states she has been eval by her PMD and Ortho MD's for same. States she "used to get shots" in her left knee, but the last time she saw her Ortho MD he told her that "he wouldn't do another one."  Denies back pain, no abd pain, no new pain, no injury, no rash, no fevers, no focal motor weakness, no tingling/numbness in extremities.     Past Medical History  Diagnosis Date  . ASCVD (arteriosclerotic cardiovascular disease)     stent to mid and proximal left anterior descending in 06/2002;drug eluting stent placed in the second diagnol in 08/2003 after  A  non-st elevation myocardial infarction   . Tobacco user     stopped   . Hyperlipidemia     pulmonary embolism 2000 and 09/2008  . Hypertension   . Pulmonary embolism     2000/09/2008  . Thyroid disease     hypothyroidism  . Allergy history unknown   . GERD (gastroesophageal reflux disease)   . Diabetes mellitus     no insulin  . Asthma   . FH: colonic polyps     adenomataous  . Anticoagulated   . Diarrhea     acute  . Rectal bleeding   . Pedal edema   . Irritable bowel syndrome   . Coronary artery disease   . Paroxysmal atrial fibrillation     normal LV function; episodes occurred in 205 and 09/2007  . Hypothyroidism   . Peripheral edema   . Allergy   . Atrial fibrillation   . Chronic anticoagulation   . Venous stasis   . Insomnia   . Chronic pain of left knee     Past Surgical History  Procedure Laterality Date  . Cleft  palate repair      4 surgeries  . Abdominal hysterectomy    . Appendectomy    . Cholecystectomy    . Heel spur surgery    . Knee surgery    . Breast reduction surgery    . Colonoscopy  2011    negative  . Oophorectomy    . Breast surgery      Family History  Problem Relation Age of Onset  . Heart attack Mother   . Diabetes Mother   . Hyperlipidemia Mother   . Heart attack Father   . Lung cancer Sister   . Lymphoma Brother     History  Substance Use Topics  . Smoking status: Former Smoker    Start date: 05/05/1987  . Smokeless tobacco: Never Used  . Alcohol Use: No      Review of Systems ROS: Statement: All systems negative except as marked or noted in the HPI; Constitutional: Negative for fever and chills. ; ; Eyes: Negative for eye pain, redness and discharge. ; ; ENMT: Negative for ear pain, hoarseness, nasal congestion, sinus pressure and sore throat. ; ; Cardiovascular:  Negative for chest pain, palpitations, diaphoresis, dyspnea and peripheral edema. ; ; Respiratory: Negative for cough, wheezing and stridor. ; ; Gastrointestinal: Negative for nausea, vomiting, diarrhea, abdominal pain, blood in stool, hematemesis, jaundice and rectal bleeding. . ; ; Genitourinary: Negative for dysuria, flank pain and hematuria. ; ; Musculoskeletal: +left leg pain. Negative for back pain and neck pain. Negative for swelling and trauma.; ; Skin: Negative for pruritus, rash, abrasions, blisters, bruising and skin lesion.; ; Neuro: Negative for headache, lightheadedness and neck stiffness. Negative for weakness, altered level of consciousness , altered mental status, extremity weakness, paresthesias, involuntary movement, seizure and syncope.       Allergies  Demerol  Home Medications   Current Outpatient Rx  Name  Route  Sig  Dispense  Refill  . Cholecalciferol (VITAMIN D3) 2000 UNITS TABS   Oral   Take 2 tablets by mouth daily.          . citalopram (CELEXA) 40 MG tablet    Oral   Take 40 mg by mouth at bedtime.         . dicyclomine (BENTYL) 10 MG capsule   Oral   Take 1 capsule (10 mg total) by mouth 2 (two) times daily.   60 capsule   5     As needed   . diltiazem (CARDIZEM CD) 300 MG 24 hr capsule   Oral   Take 300 mg by mouth every morning.         . diphenoxylate-atropine (LOMOTIL) 2.5-0.025 MG per tablet   Oral   Take 1 tablet by mouth 2 (two) times daily as needed for diarrhea or loose stools. Patient states that she takes this medication 1-2 by mouth as needed for diarrhea   60 tablet   2   . levothyroxine (SYNTHROID, LEVOTHROID) 50 MCG tablet   Oral   Take 50 mcg by mouth every morning.          . montelukast (SINGULAIR) 10 MG tablet   Oral   Take 10 mg by mouth at bedtime.         Marland Kitchen omeprazole (PRILOSEC) 20 MG capsule   Oral   Take 20 mg by mouth daily as needed (for acid reflux).         Marland Kitchen oxycodone (OXY-IR) 5 MG capsule   Oral   Take 5 mg by mouth every 4 (four) hours as needed. Takes three times a day         . pravastatin (PRAVACHOL) 40 MG tablet   Oral   Take 40 mg by mouth at bedtime.          . predniSONE (DELTASONE) 5 MG tablet   Oral   Take 5 mg by mouth daily.         . valsartan (DIOVAN) 320 MG tablet   Oral   Take 320 mg by mouth every morning.         . warfarin (COUMADIN) 5 MG tablet   Oral   Take 5 mg by mouth daily at 6 PM. 5 mg daily and 7.5 mg every Saturday         . albuterol (PROAIR HFA) 108 (90 BASE) MCG/ACT inhaler   Inhalation   Inhale 2 puffs into the lungs every 4 (four) hours as needed. For shortness of breath          . fluticasone (FLOVENT HFA) 44 MCG/ACT inhaler   Inhalation   Inhale 2 puffs into the lungs daily as needed.  BP 107/80  Pulse 66  Temp(Src) 98.6 F (37 C) (Oral)  Resp 18  Ht 5\' 1"  (1.549 m)  Wt 237 lb (107.502 kg)  BMI 44.8 kg/m2  SpO2 94%  Physical Exam 1800: Physical examination:  Nursing notes reviewed; Vital signs and O2  SAT reviewed;  Constitutional: Well developed, Well nourished, Well hydrated, In no acute distress; Head:  Normocephalic, atraumatic; Eyes: EOMI, PERRL, No scleral icterus; ENMT: Mouth and pharynx normal, Mucous membranes moist; Neck: Supple, Full range of motion, No lymphadenopathy; Cardiovascular: Regular rate and rhythm, No gallop; Respiratory: Breath sounds clear & equal bilaterally, No rales, rhonchi, wheezes.  Speaking full sentences with ease, Normal respiratory effort/excursion; Chest: Nontender, Movement normal; Abdomen: Soft, Nontender, Nondistended, Normal bowel sounds; Genitourinary: No CVA tenderness; Spine:  No midline CS, TS, LS tenderness.;; Extremities: Pulses normal, +left hip, lateral thigh, and left knee tenderness to palp. +decreased ROM F/E left knee due to increasing pain, pt is able to lift extended LLE off stretcher, and extend left lower leg against resistance.  No ligamentous laxity.  No patellar or quad tendon step-offs.  NMS intact left foot, strong pedal pp. +plantarflexion of left foot w/calf squeeze.  No palpable gap left Achilles's tendon.  No proximal fibular head tenderness.  No edema, erythema, warmth, ecchymosis or deformity.  No specific area of point tenderness. No rash, no deformity. NT left calf/ankle/foot. No edema, No calf edema or asymmetry.; Neuro: AA&Ox3, Major CN grossly intact.  Speech clear. No gross focal motor or sensory deficits in extremities.; Skin: Color normal, Warm, Dry.   ED Course  Procedures     MDM  MDM Reviewed: previous chart, nursing note and vitals Reviewed previous: labs and ultrasound Interpretation: labs and x-ray   Results for orders placed during the hospital encounter of 11/26/12  PROTIME-INR      Result Value Range   Prothrombin Time 31.8 (*) 11.6 - 15.2 seconds   INR 3.31 (*) 0.00 - 1.49   Dg Hip Complete Left 11/26/2012   *RADIOLOGY REPORT*  Clinical Data: Left hip pain.  No known injury.  LEFT HIP - COMPLETE 2+ VIEW   Comparison: None.  Findings: Right pelvic surgical clips.  Normal appearing pelvic bones and hips.  IMPRESSION: Normal appearing left hip.   Original Report Authenticated By: Claudie Revering, M.D.   Dg Knee Complete 4 Views Left 11/26/2012   *RADIOLOGY REPORT*  Clinical Data: Left knee pain.  No known injury.  LEFT KNEE - COMPLETE 4+ VIEW  Comparison: None.  Findings: Medial and lateral spur formation, most pronounced medially.  Mild lateral joint space narrowing.  Small effusion. Mild atheromatous arterial calcification.  IMPRESSION:  1.  Mild to moderate degenerative changes. 2.  Small effusion.   Original Report Authenticated By: Claudie Revering, M.D.    [phone]:  ED RN states pt told her she "found a tick on her" right abd wall 4 days ago. States pt was unable to remove it by herself. Embedded tick removed in its entirety. Pt now states she has been feeling "feverish" and "having aches" since noticing the tick on her. No rash, no fever in the ED today. Will tx with doxycycline. Pt already on oxycodone, will add muscle relaxant. Pt states she has a hx of DVT and is "worried about it." INR therapeutic today; doubt same, but pt and family have concerns regarding same and will obtain Vasc US r/o DVT tomorrow. Pt agreeable with this plan. States pain is improved and she wants to go home now.  Dx and testing d/w pt and family.  Questions answered.  Verb understanding, agreeable to d/c home with outpt f/u.         Alfonzo Feller, DO 11/28/12 1315

## 2012-11-26 NOTE — ED Notes (Signed)
History of fibromyalgia

## 2012-11-26 NOTE — ED Notes (Signed)
Found tick attached to pt's right side, unknown of how long tick attached, pt assumed it was a mole, tick removed and EDP notified and assessed site

## 2012-11-27 ENCOUNTER — Ambulatory Visit (HOSPITAL_COMMUNITY): Payer: Medicare Other

## 2012-11-27 ENCOUNTER — Ambulatory Visit (HOSPITAL_COMMUNITY)
Admission: RE | Admit: 2012-11-27 | Discharge: 2012-11-27 | Disposition: A | Discharge status: Home / Self Care | Payer: Medicare Other | Source: Ambulatory Visit | Attending: Emergency Medicine | Admitting: Emergency Medicine

## 2012-11-27 DIAGNOSIS — M79609 Pain in unspecified limb: Secondary | ICD-10-CM | POA: Insufficient documentation

## 2012-11-30 ENCOUNTER — Ambulatory Visit (HOSPITAL_COMMUNITY): Payer: Medicare Other

## 2012-12-03 ENCOUNTER — Ambulatory Visit: Payer: Medicare Other | Admitting: Family Medicine

## 2012-12-07 ENCOUNTER — Encounter: Payer: Self-pay | Admitting: Family Medicine

## 2012-12-07 ENCOUNTER — Ambulatory Visit (INDEPENDENT_AMBULATORY_CARE_PROVIDER_SITE_OTHER): Payer: Medicare Other | Admitting: Family Medicine

## 2012-12-07 VITALS — BP 148/82 | Wt 242.0 lb

## 2012-12-07 DIAGNOSIS — M353 Polymyalgia rheumatica: Secondary | ICD-10-CM | POA: Diagnosis not present

## 2012-12-07 NOTE — Progress Notes (Signed)
  Subjective:    Patient ID: [name], female    DOB: April 05, 1941, 72 y.o.   MRN: [id number]  Leg Pain  The incident occurred more than 1 week ago. The incident occurred at the gym. There was no injury mechanism. The pain is present in the left leg. The quality of the pain is described as aching. The pain is at a severity of 5/10. The pain is moderate. The pain has been improving since onset. Pertinent negatives include no inability to bear weight. She reports no foreign bodies present. The symptoms are aggravated by palpation. She has tried nothing for the symptoms. The treatment provided moderate relief.    Due to see a rheumatologist shortly  Review of Systems No headache no chest pain ongoing fatigue.    Objective:   Physical Exam  Alert no acute distress. Lungs clear. Heart regular in rhythm. Left leg tender to deep palpation left anterior thigh. Knees some crepitations.      Assessment & Plan:  Impression worsening polymyalgia rheumatica. Plan increase oxycodone to 3 times a day rationale discussed. Followup with rheumatologist is requested. WSL

## 2012-12-09 DIAGNOSIS — M719 Bursopathy, unspecified: Secondary | ICD-10-CM | POA: Diagnosis not present

## 2012-12-09 DIAGNOSIS — M545 Low back pain, unspecified: Secondary | ICD-10-CM | POA: Diagnosis not present

## 2012-12-09 DIAGNOSIS — IMO0001 Reserved for inherently not codable concepts without codable children: Secondary | ICD-10-CM | POA: Diagnosis not present

## 2012-12-09 DIAGNOSIS — M353 Polymyalgia rheumatica: Secondary | ICD-10-CM | POA: Diagnosis not present

## 2012-12-09 DIAGNOSIS — M67919 Unspecified disorder of synovium and tendon, unspecified shoulder: Secondary | ICD-10-CM | POA: Diagnosis not present

## 2012-12-16 ENCOUNTER — Other Ambulatory Visit (HOSPITAL_COMMUNITY): Payer: Self-pay | Admitting: Rheumatology

## 2012-12-16 ENCOUNTER — Ambulatory Visit (HOSPITAL_COMMUNITY)
Admission: RE | Admit: 2012-12-16 | Discharge: 2012-12-16 | Disposition: A | Discharge status: Home / Self Care | Payer: Medicare Other | Source: Ambulatory Visit | Attending: Rheumatology | Admitting: Rheumatology

## 2012-12-16 DIAGNOSIS — IMO0001 Reserved for inherently not codable concepts without codable children: Secondary | ICD-10-CM | POA: Diagnosis not present

## 2012-12-16 DIAGNOSIS — M545 Low back pain, unspecified: Secondary | ICD-10-CM

## 2012-12-16 DIAGNOSIS — M1710 Unilateral primary osteoarthritis, unspecified knee: Secondary | ICD-10-CM | POA: Diagnosis not present

## 2012-12-16 DIAGNOSIS — M5137 Other intervertebral disc degeneration, lumbosacral region: Secondary | ICD-10-CM | POA: Diagnosis not present

## 2012-12-16 DIAGNOSIS — M51379 Other intervertebral disc degeneration, lumbosacral region without mention of lumbar back pain or lower extremity pain: Secondary | ICD-10-CM | POA: Insufficient documentation

## 2012-12-16 DIAGNOSIS — M25519 Pain in unspecified shoulder: Secondary | ICD-10-CM | POA: Diagnosis not present

## 2012-12-16 DIAGNOSIS — M25512 Pain in left shoulder: Secondary | ICD-10-CM

## 2012-12-16 DIAGNOSIS — IMO0002 Reserved for concepts with insufficient information to code with codable children: Secondary | ICD-10-CM | POA: Diagnosis not present

## 2012-12-16 DIAGNOSIS — M25569 Pain in unspecified knee: Secondary | ICD-10-CM | POA: Insufficient documentation

## 2012-12-16 DIAGNOSIS — M25562 Pain in left knee: Secondary | ICD-10-CM

## 2012-12-16 DIAGNOSIS — M47817 Spondylosis without myelopathy or radiculopathy, lumbosacral region: Secondary | ICD-10-CM | POA: Diagnosis not present

## 2012-12-17 ENCOUNTER — Other Ambulatory Visit: Payer: Self-pay | Admitting: Cardiology

## 2012-12-17 ENCOUNTER — Other Ambulatory Visit: Payer: Self-pay | Admitting: Family Medicine

## 2012-12-18 ENCOUNTER — Other Ambulatory Visit: Payer: Self-pay | Admitting: Rheumatology

## 2012-12-18 DIAGNOSIS — M79605 Pain in left leg: Secondary | ICD-10-CM

## 2012-12-18 DIAGNOSIS — M545 Low back pain, unspecified: Secondary | ICD-10-CM

## 2012-12-22 ENCOUNTER — Ambulatory Visit
Admission: RE | Admit: 2012-12-22 | Discharge: 2012-12-22 | Disposition: A | Discharge status: Home / Self Care | Payer: Medicare Other | Source: Ambulatory Visit | Attending: Rheumatology | Admitting: Rheumatology

## 2012-12-22 DIAGNOSIS — M545 Low back pain, unspecified: Secondary | ICD-10-CM

## 2012-12-22 DIAGNOSIS — M79605 Pain in left leg: Secondary | ICD-10-CM

## 2012-12-22 DIAGNOSIS — M5126 Other intervertebral disc displacement, lumbar region: Secondary | ICD-10-CM | POA: Diagnosis not present

## 2012-12-23 ENCOUNTER — Other Ambulatory Visit: Payer: Self-pay | Admitting: Rheumatology

## 2012-12-23 DIAGNOSIS — M549 Dorsalgia, unspecified: Secondary | ICD-10-CM

## 2012-12-29 ENCOUNTER — Other Ambulatory Visit: Payer: Self-pay | Admitting: *Deleted

## 2012-12-29 ENCOUNTER — Telehealth: Payer: Self-pay | Admitting: Family Medicine

## 2012-12-29 DIAGNOSIS — Z7901 Long term (current) use of anticoagulants: Secondary | ICD-10-CM

## 2012-12-29 NOTE — Telephone Encounter (Signed)
It is time for INR. Blood work papers printed and left up front for patient pickup.  Patient notified

## 2012-12-29 NOTE — Telephone Encounter (Signed)
Patient is calling to see if it is time for her to have her INR checked again. If so, she needs an order to take to [facility]. Please call when complete.

## 2013-01-04 DIAGNOSIS — Z7901 Long term (current) use of anticoagulants: Secondary | ICD-10-CM | POA: Diagnosis not present

## 2013-01-04 LAB — PROTIME-INR: INR: 1.85 — ABNORMAL HIGH (ref <1.50)

## 2013-01-07 DIAGNOSIS — M719 Bursopathy, unspecified: Secondary | ICD-10-CM | POA: Diagnosis not present

## 2013-01-07 DIAGNOSIS — M67919 Unspecified disorder of synovium and tendon, unspecified shoulder: Secondary | ICD-10-CM | POA: Diagnosis not present

## 2013-01-07 DIAGNOSIS — IMO0002 Reserved for concepts with insufficient information to code with codable children: Secondary | ICD-10-CM | POA: Diagnosis not present

## 2013-01-07 DIAGNOSIS — Z79899 Other long term (current) drug therapy: Secondary | ICD-10-CM | POA: Diagnosis not present

## 2013-01-07 DIAGNOSIS — M25549 Pain in joints of unspecified hand: Secondary | ICD-10-CM | POA: Diagnosis not present

## 2013-01-12 ENCOUNTER — Ambulatory Visit (INDEPENDENT_AMBULATORY_CARE_PROVIDER_SITE_OTHER): Payer: Medicare Other | Admitting: Family Medicine

## 2013-01-12 ENCOUNTER — Encounter: Payer: Self-pay | Admitting: Family Medicine

## 2013-01-12 VITALS — BP 134/88 | HR 80 | Wt 234.0 lb

## 2013-01-12 DIAGNOSIS — I4891 Unspecified atrial fibrillation: Secondary | ICD-10-CM

## 2013-01-12 DIAGNOSIS — Z7901 Long term (current) use of anticoagulants: Secondary | ICD-10-CM

## 2013-01-12 DIAGNOSIS — I2699 Other pulmonary embolism without acute cor pulmonale: Secondary | ICD-10-CM | POA: Diagnosis not present

## 2013-01-12 NOTE — Progress Notes (Signed)
  Subjective:    Patient ID: [name], female    DOB: 03-28-1941, 73 y.o.   MRN: [id number]  HPI  INR/Prothrombin Time recently done. Patient on Coumadin. History of A. fib. History of pulmonary emboli. The specialist want to do a therapeutic injection but they want her to stop the Coumadin for 5 days.   Review of Systems Ongoing back pain no chest pain no shortness of breath no active bleeding    Objective:   Physical Exam  Unchanged      Assessment & Plan:  Impression chronic anticoagulation with the anticipation of a intervention soon. Discussed at length. 15 minutes spent most in discussion. Plan after further discussion patient would like to use bridge therapy. With her history of pulmonary emboli in the past I feel this is appropriate. See patient instructions for exact regimen. WSL

## 2013-01-12 NOTE — Patient Instructions (Addendum)
Five days before your procedure, do not take coumadin that day.  Five days before your procedure, start the lovenox injections twice per day.  One day before your procedure, take a lovenox injection that morning and then stop the injections.  The day of your procedure, take nothing.  The day following your procedure, resume the lovenox injections AND the coumadin.  Three days after the procedure, do your INR test (We will have the machine working here by then)

## 2013-01-13 ENCOUNTER — Other Ambulatory Visit: Payer: Self-pay | Admitting: Family Medicine

## 2013-01-22 ENCOUNTER — Ambulatory Visit: Payer: Medicare Other | Admitting: Family Medicine

## 2013-01-29 ENCOUNTER — Telehealth: Payer: Self-pay | Admitting: Family Medicine

## 2013-01-29 NOTE — Telephone Encounter (Signed)
Calling to check on when she is supposed to have next INR

## 2013-01-29 NOTE — Telephone Encounter (Signed)
Discussed pt due 08/14 and she made appt for inr check

## 2013-02-04 ENCOUNTER — Ambulatory Visit (INDEPENDENT_AMBULATORY_CARE_PROVIDER_SITE_OTHER): Payer: Medicare Other

## 2013-02-04 DIAGNOSIS — Z7901 Long term (current) use of anticoagulants: Secondary | ICD-10-CM

## 2013-02-04 LAB — POCT INR: INR: 2.9

## 2013-02-04 NOTE — Patient Instructions (Signed)
Continue same treatment, return in one month

## 2013-02-11 ENCOUNTER — Ambulatory Visit
Admission: RE | Admit: 2013-02-11 | Discharge: 2013-02-11 | Disposition: A | Discharge status: Home / Self Care | Payer: Medicare Other | Source: Ambulatory Visit | Attending: Rheumatology | Admitting: Rheumatology

## 2013-02-11 DIAGNOSIS — M549 Dorsalgia, unspecified: Secondary | ICD-10-CM

## 2013-02-11 MED ORDER — METHYLPREDNISOLONE ACETATE 40 MG/ML INJ SUSP (RADIOLOG: 120.0000 mg | Freq: Once | INTRAMUSCULAR | Status: DC

## 2013-02-11 MED ORDER — IOHEXOL 180 MG/ML  SOLN: 1.0000 mL | Freq: Once | INTRAMUSCULAR | Status: AC | PRN

## 2013-02-12 ENCOUNTER — Other Ambulatory Visit: Payer: Self-pay | Admitting: Cardiology

## 2013-02-12 ENCOUNTER — Other Ambulatory Visit: Payer: Self-pay | Admitting: *Deleted

## 2013-02-12 ENCOUNTER — Telehealth: Payer: Self-pay | Admitting: Family Medicine

## 2013-02-12 MED ORDER — OXYCODONE HCL 5 MG PO CAPS: ORAL_CAPSULE | ORAL | Status: DC

## 2013-02-12 NOTE — Telephone Encounter (Signed)
Last office visit was 01/12/13. 12/07/12 visit note states plan increase oxycodone to 3 times a day.

## 2013-02-12 NOTE — Telephone Encounter (Signed)
Patient needs a refill of her oxycodone

## 2013-02-12 NOTE — Telephone Encounter (Signed)
[facility]

## 2013-02-12 NOTE — Telephone Encounter (Signed)
rx ready for pickup. Pt notified

## 2013-02-16 ENCOUNTER — Ambulatory Visit: Payer: Medicare Other

## 2013-02-17 ENCOUNTER — Ambulatory Visit (INDEPENDENT_AMBULATORY_CARE_PROVIDER_SITE_OTHER): Payer: Medicare Other

## 2013-02-17 DIAGNOSIS — Z7901 Long term (current) use of anticoagulants: Secondary | ICD-10-CM | POA: Diagnosis not present

## 2013-02-24 ENCOUNTER — Telehealth: Payer: Self-pay | Admitting: Family Medicine

## 2013-02-24 ENCOUNTER — Ambulatory Visit: Payer: Medicare Other | Admitting: Adult Health

## 2013-02-24 NOTE — Telephone Encounter (Signed)
Call pharm and refill same as before. Ck pt instructions--written there on recent note

## 2013-02-24 NOTE — Telephone Encounter (Signed)
Patient would like Rx for Lovenox-She states that she did not get the injection in her back.   [facility]-delivered

## 2013-02-24 NOTE — Telephone Encounter (Signed)
RX Lovenox called in to [facility]. Patient was notified.

## 2013-03-03 ENCOUNTER — Ambulatory Visit (INDEPENDENT_AMBULATORY_CARE_PROVIDER_SITE_OTHER): Payer: Medicare Other | Admitting: *Deleted

## 2013-03-03 DIAGNOSIS — Z7901 Long term (current) use of anticoagulants: Secondary | ICD-10-CM

## 2013-03-03 LAB — POCT INR: INR: 1.1

## 2013-03-04 ENCOUNTER — Ambulatory Visit
Admission: RE | Admit: 2013-03-04 | Discharge: 2013-03-04 | Disposition: A | Discharge status: Home / Self Care | Payer: Medicare Other | Source: Ambulatory Visit | Attending: Rheumatology | Admitting: Rheumatology

## 2013-03-04 DIAGNOSIS — M5126 Other intervertebral disc displacement, lumbar region: Secondary | ICD-10-CM | POA: Diagnosis not present

## 2013-03-04 DIAGNOSIS — M47817 Spondylosis without myelopathy or radiculopathy, lumbosacral region: Secondary | ICD-10-CM | POA: Diagnosis not present

## 2013-03-04 MED ORDER — METHYLPREDNISOLONE ACETATE 40 MG/ML INJ SUSP (RADIOLOG
120.0000 mg | Freq: Once | INTRAMUSCULAR | Status: AC
  Administered 2013-03-04: 120 mg via EPIDURAL

## 2013-03-04 MED ORDER — IOHEXOL 180 MG/ML  SOLN
1.0000 mL | Freq: Once | INTRAMUSCULAR | Status: AC | PRN
  Administered 2013-03-04: 1 mL via EPIDURAL

## 2013-03-05 ENCOUNTER — Ambulatory Visit (INDEPENDENT_AMBULATORY_CARE_PROVIDER_SITE_OTHER): Payer: Medicare Other | Admitting: Adult Health

## 2013-03-05 ENCOUNTER — Encounter: Payer: Self-pay | Admitting: Adult Health

## 2013-03-05 VITALS — BP 126/70 | HR 57 | Ht 63.0 in | Wt 235.0 lb

## 2013-03-05 DIAGNOSIS — I4891 Unspecified atrial fibrillation: Secondary | ICD-10-CM

## 2013-03-05 DIAGNOSIS — I10 Essential (primary) hypertension: Secondary | ICD-10-CM

## 2013-03-05 DIAGNOSIS — I2510 Atherosclerotic heart disease of native coronary artery without angina pectoris: Secondary | ICD-10-CM

## 2013-03-05 NOTE — Progress Notes (Signed)
HPI: [name] is a 72 year old former patient of [name] we are following for ongoing assessment and management of paroxysmal atrial fibrillation, CAD, history of pulmonary embolism requiring anticoagulation therapy, and hypertension. The patient was last seen in the office by [name] on 10/07/2010. Medications were continued, she remained on anticoagulation therapy.  She is without cardiac complaint today. She is more active as she has changed from using a cane to a rolling walker. She has received an injection in her back for help with sciatic pain, and is now on LMWH bridging with coumadin. She is followed by Dr. Wolfgang Phoenix for dosing.  Allergies  Allergen Reactions  . Demerol Nausea And Vomiting    Current Outpatient Prescriptions  Medication Sig Dispense Refill  . albuterol (PROAIR HFA) 108 (90 BASE) MCG/ACT inhaler Inhale 2 puffs into the lungs every 4 (four) hours as needed. For shortness of breath       . Cholecalciferol (VITAMIN D3) 2000 UNITS TABS Take 2 tablets by mouth daily.       . citalopram (CELEXA) 40 MG tablet Take 40 mg by mouth at bedtime.      . dicyclomine (BENTYL) 10 MG capsule Take 10 mg by mouth 4 (four) times daily -  before meals and at bedtime.      Marland Kitchen diltiazem (CARDIZEM CD) 300 MG 24 hr capsule Take 300 mg by mouth every morning.      Marland Kitchen DIOVAN 320 MG tablet TAKE (1) TABLET BY MOUTH ONCE DAILY.  30 tablet  1  . diphenoxylate-atropine (LOMOTIL) 2.5-0.025 MG per tablet Take 1 tablet by mouth 2 (two) times daily as needed for diarrhea or loose stools. Patient states that she takes this medication 1-2 by mouth as needed for diarrhea  60 tablet  2  . fluticasone (FLOVENT HFA) 44 MCG/ACT inhaler Inhale 2 puffs into the lungs daily as needed.       . furosemide (LASIX) 80 MG tablet Take 80 mg by mouth daily.      Marland Kitchen levothyroxine (SYNTHROID, LEVOTHROID) 50 MCG tablet TAKE (1) TABLET BY MOUTH ONCE DAILY.  30 tablet  5  . montelukast (SINGULAIR) 10 MG tablet Take  10 mg by mouth at bedtime.      Marland Kitchen oxycodone (OXY-IR) 5 MG capsule One TID prn pain  90 capsule  0  . potassium chloride SA (K-DUR,KLOR-CON) 20 MEQ tablet Take 20 mEq by mouth daily.      . pravastatin (PRAVACHOL) 40 MG tablet Take 40 mg by mouth at bedtime.       . predniSONE (DELTASONE) 10 MG tablet Take 5 mg by mouth daily.       Marland Kitchen warfarin (COUMADIN) 5 MG tablet TAKE 1/2 TABLET ON TUES AND FRIDAY AND 1 TABLET ALL OTHER DAYS.  30 tablet  5  . [DISCONTINUED] ferrous sulfate 325 (65 FE) MG tablet Take 1 tablet (325 mg total) by mouth daily with breakfast. IRON SUPPLEMENT.      . [DISCONTINUED] glyBURIDE (DIABETA) 5 MG tablet Take 2.5 mg by mouth daily with breakfast.       . [DISCONTINUED] ipratropium (ATROVENT) 0.02 % nebulizer solution Take 500 mcg by nebulization every 4 (four) hours as needed. For shortness of breath       No current facility-administered medications for this visit.    Past Medical History  Diagnosis Date  . ASCVD (arteriosclerotic cardiovascular disease)     stent to mid and proximal left anterior descending in 06/2002;drug eluting stent placed in the  second diagnol in 08/2003 after  A  non-st elevation myocardial infarction   . Tobacco user     stopped   . Hyperlipidemia     pulmonary embolism 2000 and 09/2008  . Hypertension   . Pulmonary embolism     2000/09/2008  . Thyroid disease     hypothyroidism  . Allergy history unknown   . GERD (gastroesophageal reflux disease)   . Diabetes mellitus     no insulin  . Asthma   . FH: colonic polyps     adenomataous  . Anticoagulated   . Diarrhea     acute  . Rectal bleeding   . Pedal edema   . Irritable bowel syndrome   . Coronary artery disease   . Paroxysmal atrial fibrillation     normal LV function; episodes occurred in 205 and 09/2007  . Hypothyroidism   . Peripheral edema   . Allergy   . Atrial fibrillation   . Chronic anticoagulation   . Venous stasis   . Insomnia   . Chronic pain of left knee      Past Surgical History  Procedure Laterality Date  . Cleft palate repair      4 surgeries  . Abdominal hysterectomy    . Appendectomy    . Cholecystectomy    . Heel spur surgery    . Knee surgery    . Breast reduction surgery    . Colonoscopy  2011    negative  . Oophorectomy    . Breast surgery      [id number] of systems complete and found to be negative unless listed above  PHYSICAL EXAM BP 126/70  Pulse 57  Ht 5\' 3"  (1.6 m)  Wt 235 lb (106.595 kg)  BMI 41.64 kg/m2  General: Well developed, well nourished, obese in no acute distress Head: Eyes PERRLA, No xanthomas.   Normal cephalic and atramatic  Lungs: Clear bilaterally to auscultation and percussion. Heart: HRRR S1 S2, without MRG.  Pulses are 2+ & equal.            No carotid bruit. No JVD.  No abdominal bruits. No femoral bruits. Abdomen: Bowel sounds are positive, abdomen soft and non-tender without masses or                  Hernia's noted. Msk:  Back normal, normal gait. Normal strength and tone for age. Extremities: No clubbing, cyanosis or edema.  DP +1 Neuro: Alert and oriented X 3. Psych:  Good affect, responds appropriately  EKG:NSR rate of  57 bpm.  ASSESSMENT AND PLAN

## 2013-03-05 NOTE — Patient Instructions (Addendum)
Your physician recommends that you schedule a follow-up appointment in: ONE YEAR  Your physician recommends that you continue on your current medications as directed. Please refer to the Current Medication list given to you today.  

## 2013-03-05 NOTE — Assessment & Plan Note (Signed)
Currently in NSR . She is on LMWH bridging currently with coumadin due to lumbar injection. She is followed by Dr. Wolfgang Phoenix for dosing.

## 2013-03-05 NOTE — Progress Notes (Deleted)
Name: [name]    DOB: 1940/09/04  Age: 72 y.o.  MR#: [id number]       PCP:  Rubbie Battiest, MD      Insurance: Payor: MEDICARE / Plan: MEDICARE PART A AND B / Product Type: *No Product type* /   CC:    Chief Complaint  Patient presents with  . Atrial Fibrillation  . Coronary Artery Disease    VS Filed Vitals:   03/05/13 1413  BP: 126/70  Pulse: 57  Height: 5\' 3"  (1.6 m)  Weight: 235 lb (106.595 kg)    Weights Current Weight  03/05/13 235 lb (106.595 kg)  01/12/13 234 lb (106.142 kg)  12/07/12 242 lb (109.77 kg)    Blood Pressure  BP Readings from Last 3 Encounters:  03/05/13 126/70  03/04/13 140/65  01/12/13 134/88     Admit date:  (Not on file) Last encounter with RMR:  Visit date not found   Allergy Demerol  Current Outpatient Prescriptions  Medication Sig Dispense Refill  . albuterol (PROAIR HFA) 108 (90 BASE) MCG/ACT inhaler Inhale 2 puffs into the lungs every 4 (four) hours as needed. For shortness of breath       . Cholecalciferol (VITAMIN D3) 2000 UNITS TABS Take 2 tablets by mouth daily.       . citalopram (CELEXA) 40 MG tablet Take 40 mg by mouth at bedtime.      . dicyclomine (BENTYL) 10 MG capsule Take 10 mg by mouth 4 (four) times daily -  before meals and at bedtime.      Marland Kitchen diltiazem (CARDIZEM CD) 300 MG 24 hr capsule Take 300 mg by mouth every morning.      Marland Kitchen DIOVAN 320 MG tablet TAKE (1) TABLET BY MOUTH ONCE DAILY.  30 tablet  1  . diphenoxylate-atropine (LOMOTIL) 2.5-0.025 MG per tablet Take 1 tablet by mouth 2 (two) times daily as needed for diarrhea or loose stools. Patient states that she takes this medication 1-2 by mouth as needed for diarrhea  60 tablet  2  . fluticasone (FLOVENT HFA) 44 MCG/ACT inhaler Inhale 2 puffs into the lungs daily as needed.       . furosemide (LASIX) 80 MG tablet Take 80 mg by mouth daily.      Marland Kitchen levothyroxine (SYNTHROID, LEVOTHROID) 50 MCG tablet TAKE (1) TABLET BY MOUTH ONCE DAILY.  30 tablet  5  . montelukast  (SINGULAIR) 10 MG tablet Take 10 mg by mouth at bedtime.      Marland Kitchen oxycodone (OXY-IR) 5 MG capsule One TID prn pain  90 capsule  0  . potassium chloride SA (K-DUR,KLOR-CON) 20 MEQ tablet Take 20 mEq by mouth daily.      . pravastatin (PRAVACHOL) 40 MG tablet Take 40 mg by mouth at bedtime.       . predniSONE (DELTASONE) 10 MG tablet Take 5 mg by mouth daily.       Marland Kitchen warfarin (COUMADIN) 5 MG tablet TAKE 1/2 TABLET ON TUES AND FRIDAY AND 1 TABLET ALL OTHER DAYS.  30 tablet  5  . [DISCONTINUED] ferrous sulfate 325 (65 FE) MG tablet Take 1 tablet (325 mg total) by mouth daily with breakfast. IRON SUPPLEMENT.      . [DISCONTINUED] glyBURIDE (DIABETA) 5 MG tablet Take 2.5 mg by mouth daily with breakfast.       . [DISCONTINUED] ipratropium (ATROVENT) 0.02 % nebulizer solution Take 500 mcg by nebulization every 4 (four) hours as needed. For shortness of breath  No current facility-administered medications for this visit.    Discontinued Meds:    Medications Discontinued During This Encounter  Medication Reason  . omeprazole (PRILOSEC) 20 MG capsule Error  . methocarbamol (ROBAXIN) 500 MG tablet Error  . TRAMADOL HCL PO Error  . dicyclomine (BENTYL) 10 MG capsule     Patient Active Problem List   Diagnosis Date Noted  . Difficulty in walking(719.7) 10/19/2012  . Weakness of left leg 10/19/2012  . Long term (current) use of anticoagulants 09/26/2012  . Dysphagia 05/04/2012  . Acute renal failure 08/10/2011  . Microcytic anemia 08/10/2011  . Acute bronchitis 08/09/2011  . Acute respiratory failure 08/09/2011  . IBS (irritable bowel syndrome) 08/09/2011  . Elevated serum creatinine 08/09/2011  . Chest pressure 03/05/2011  . DOE (dyspnea on exertion) 03/05/2011  . Polymyalgia rheumatica 03/05/2011  . Anemia 03/05/2011  . Peripheral neuropathy 10/02/2010  . 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 HYPOTHYROIDISM 12/02/2008  . DM 12/02/2008  .  HYPERLIPIDEMIA 12/02/2008  . HYPERTENSION 12/02/2008  . CORONARY ARTERY DISEASE 12/02/2008  . FIBRILLATION, ATRIAL 12/02/2008  . ASTHMA 12/02/2008  . GERD 12/02/2008  . TOBACCO USE, QUIT 12/02/2008    LABS    Component Value Date/Time   NA 142 09/13/2011 1113   NA 137 08/11/2011 0416   NA 138 08/10/2011 0654   K 3.8 09/13/2011 1113   K 4.1 08/11/2011 0416   K 4.2 08/10/2011 0654   CL 101 09/13/2011 1113   CL 102 08/11/2011 0416   CL 100 08/10/2011 0654   CO2 34* 09/13/2011 1113   CO2 26 08/11/2011 0416   CO2 27 08/10/2011 0654   GLUCOSE 110* 09/13/2011 1113   GLUCOSE 178* 08/11/2011 0416   GLUCOSE 184* 08/10/2011 0654   BUN 19 09/13/2011 1113   BUN 27* 08/11/2011 0416   BUN 20 08/10/2011 0654   CREATININE 0.91 09/13/2011 1113   CREATININE 0.86 08/11/2011 0416   CREATININE 0.90 08/10/2011 0654   CREATININE 0.82 10/05/2010 1605   CALCIUM 9.6 09/13/2011 1113   CALCIUM 9.9 08/11/2011 0416   CALCIUM 9.6 08/10/2011 0654   GFRNONAA 62* 09/13/2011 1113   GFRNONAA 66* 08/11/2011 0416   GFRNONAA 62* 08/10/2011 0654   GFRAA 71* 09/13/2011 1113   GFRAA 76* 08/11/2011 0416   GFRAA 72* 08/10/2011 0654   CMP     Component Value Date/Time   NA 142 09/13/2011 1113   K 3.8 09/13/2011 1113   CL 101 09/13/2011 1113   CO2 34* 09/13/2011 1113   GLUCOSE 110* 09/13/2011 1113   BUN 19 09/13/2011 1113   CREATININE 0.91 09/13/2011 1113   CREATININE 0.82 10/05/2010 1605   CALCIUM 9.6 09/13/2011 1113   PROT 6.6 08/10/2011 0654   ALBUMIN 3.1* 08/10/2011 0654   AST 14 08/10/2011 0654   ALT 21 08/10/2011 0654   ALKPHOS 64 08/10/2011 0654   BILITOT 0.2* 08/10/2011 0654   GFRNONAA 62* 09/13/2011 1113   GFRAA 71* 09/13/2011 1113       Component Value Date/Time   WBC 11.4* 09/13/2011 1113   WBC 16.1* 08/11/2011 0416   WBC 13.5* 08/10/2011 0654   HGB 10.4* 09/13/2011 1113   HGB 10.0* 08/11/2011 0416   HGB 10.1* 08/10/2011 0654   HCT 34.9* 09/13/2011 1113   HCT 33.0* 08/11/2011 0416   HCT 33.4* 08/10/2011 0654   MCV 77.0* 09/13/2011  1113   MCV 74.2* 08/11/2011 0416   MCV 74.1* 08/10/2011 0654    Lipid Panel  Component Value Date/Time   CHOL 159 02/13/2010 1821   TRIG 155* 02/13/2010 1821   HDL 40 02/13/2010 1821   CHOLHDL 4.0 Ratio 02/13/2010 1821   VLDL 31 02/13/2010 1821   LDLCALC 88 02/13/2010 1821    ABG    Component Value Date/Time   PHART 7.406* 10/22/2008 0400   PCO2ART 50.7* 10/22/2008 0400   PO2ART 79.1* 10/22/2008 0400   HCO3 31.2* 10/22/2008 0400   O2SAT 95.1 10/22/2008 0400     Lab Results  Component Value Date   TSH 1.211 08/10/2011   BNP (last 3 results) No results found for this basename: PROBNP,  in the last 8760 hours Cardiac Panel (last 3 results) No results found for this basename: CKTOTAL, CKMB, TROPONINI, RELINDX,  in the last 72 hours  Iron/TIBC/Ferritin    Component Value Date/Time   IRON 32* 08/10/2011 0654   TIBC 329 08/10/2011 0654   FERRITIN 20 08/10/2011 0654     EKG Orders placed in visit on 03/05/13  . EKG 12-LEAD     Prior Assessment and Plan Problem List as of 03/05/2013     Cardiovascular and Mediastinum   HYPERTENSION   Last Assessment & Plan   10/02/2010 Office Visit Edited 10/07/2010  8:19 AM by Yehuda Savannah, MD     Control of hypertension is excellent with current medications, which will be continued.    CORONARY ARTERY DISEASE   Last Assessment & Plan   10/02/2010 Office Visit Written 10/02/2010  6:42 PM by Yehuda Savannah, MD     Patient has had no symptoms to suggest myocardial ischemia for the past 6 years.    PULMONARY EMBOLISM   Last Assessment & Plan   10/02/2010 Office Visit Written 10/02/2010  6:46 PM by Yehuda Savannah, MD     With a history of unprovoked pulmonary embolism, full anticoagulation will be continued unless risk should exceed benefit in the future.    FIBRILLATION, ATRIAL   Last Assessment & Plan   10/02/2010 Office Visit Written 10/02/2010  6:44 PM by Yehuda Savannah, MD     Episodes of atrial fibrillation have been widely separated  and well tolerated.  Patient experiences no symptoms suggesting arrhythmia in between clearly documented spells.  Digoxin will be discontinued, as there is likely little benefit and medical regime is complex.  Full anticoagulation is required for her history of pulmonary embolism, minimizing risk of thromboembolism from a cardiac source.      Respiratory   ASTHMA   Acute bronchitis   Acute respiratory failure     Digestive   IBS (irritable bowel syndrome)   COLONIC POLYPS, ADENOMATOUS   GERD   Dysphagia     Endocrine   HYPOTHYROIDISM   DM     Nervous and Auditory   Peripheral neuropathy   Last Assessment & Plan   10/02/2010 Office Visit Written 10/02/2010  6:51 PM by Yehuda Savannah, MD     Patient is concerned about both her tremor and the sensory defect in her left foot.  Situation discussed with Dr. Wolfgang Phoenix.  We agree that neurologic consultation would be helpful.    Weakness of left leg     Genitourinary   Acute renal failure     Other   Polymyalgia rheumatica   HYPERLIPIDEMIA   Last Assessment & Plan   10/02/2010 Office Visit Edited 10/07/2010  8:19 AM by Yehuda Savannah, MD     With known coronary disease, efforts to control hyperlipidemia should  be vigorous. Lipid profile from 8 months ago showed very good control.    TOBACCO USE, QUIT   Chest pressure   DOE (dyspnea on exertion)   Anemia   Elevated serum creatinine   Microcytic anemia   Long term (current) use of anticoagulants   Difficulty in walking(719.7)       Imaging: Dg Epidurography  03/04/2013   *RADIOLOGY REPORT*  CLINICAL DATA:  Lumbosacral spondylosis without myelopathy. Displaced disc at L5-S1.  Left lower extremity radiculitis, mostly within the L5 distribution.  LUMBAR EPIDURAL INJECTION: An interlaminar approach was performed on the left at L5-S1.  The overlying skin was cleansed and anesthetized.  A 20 gauge spinal needle was advanced using loss-of-resistance technique.  Injection of 2cc of  Omnipaque 180 confirmed epidural placement.  There was no evidence for intravascular or intrathecal spread of contrast.  I then injected 120 mg of Depo-Medrol and 44ml of 1% lidocaine.  The patient tolerated the procedure without evidence for complication. The patient  was observed for 20 minutes prior to discharge in stable neurologic condition.  FLUORO TIME:  36 seconds  IMPRESSIONS:  Technically successful first interlaminar epidural steroid injection on the left at L5-S1.   Original Report Authenticated By: San Morelle, M.D.

## 2013-03-05 NOTE — Assessment & Plan Note (Signed)
Good BP control. Will continue current medication regimen. I have encouraged her to continue to be more active and to lose weight which will help with BP. For now no changes.

## 2013-03-08 ENCOUNTER — Ambulatory Visit (INDEPENDENT_AMBULATORY_CARE_PROVIDER_SITE_OTHER): Payer: Medicare Other | Admitting: *Deleted

## 2013-03-08 DIAGNOSIS — Z7901 Long term (current) use of anticoagulants: Secondary | ICD-10-CM | POA: Diagnosis not present

## 2013-03-08 LAB — POCT INR: INR: 2

## 2013-03-09 ENCOUNTER — Ambulatory Visit: Payer: Medicare Other

## 2013-03-17 ENCOUNTER — Ambulatory Visit: Payer: Medicare Other

## 2013-03-20 ENCOUNTER — Other Ambulatory Visit: Payer: Self-pay | Admitting: Cardiology

## 2013-03-20 ENCOUNTER — Other Ambulatory Visit: Payer: Self-pay | Admitting: Family Medicine

## 2013-03-22 ENCOUNTER — Telehealth: Payer: Self-pay | Admitting: Family Medicine

## 2013-03-22 MED ORDER — OXYCODONE HCL 5 MG PO CAPS: ORAL_CAPSULE | ORAL | Status: DC

## 2013-03-22 NOTE — Telephone Encounter (Signed)
wis 

## 2013-03-22 NOTE — Telephone Encounter (Signed)
Patient would like a refill of her pain medication

## 2013-03-22 NOTE — Telephone Encounter (Signed)
Last filled 02/12/13

## 2013-03-22 NOTE — Telephone Encounter (Signed)
Rx printed and left up front for patient pick up. Patient notified.

## 2013-04-08 ENCOUNTER — Ambulatory Visit: Payer: Medicare Other

## 2013-04-12 ENCOUNTER — Encounter: Payer: Self-pay | Admitting: Family Medicine

## 2013-04-12 ENCOUNTER — Ambulatory Visit (INDEPENDENT_AMBULATORY_CARE_PROVIDER_SITE_OTHER): Payer: Medicare Other | Admitting: Family Medicine

## 2013-04-12 VITALS — BP 128/82 | Ht 63.0 in | Wt 238.8 lb

## 2013-04-12 DIAGNOSIS — Z23 Encounter for immunization: Secondary | ICD-10-CM

## 2013-04-12 DIAGNOSIS — Z7901 Long term (current) use of anticoagulants: Secondary | ICD-10-CM | POA: Diagnosis not present

## 2013-04-12 MED ORDER — OXYCODONE HCL 5 MG PO CAPS: ORAL_CAPSULE | ORAL | Status: DC

## 2013-04-12 NOTE — Progress Notes (Signed)
  Subjective:    Patient ID: [name], female    DOB: [birth year], 72 y.o.   MRN: [id number]  HPI Patient arrives for follow up on chronic pain and for her monthly INR. Patient reports that she fell in Sept. and hurt her left knee.  Results for orders placed in visit on 04/12/13  POCT INR      Result Value Range   INR 2.4     Due to have blood work soon,  No flu shot yet  Struck knee at the edge of the steps, noted sig bruising, some pain with walking.  Claims compliance with medications.  Now seen a rheumatologist Dr. Zenda Alpers slow. He feels the patient does not have polymyalgia rheumatica. Also he is raise the possibly rheumatoid arthritis. He is encouraged her to get off of her prednisone.  Patient claims compliance with the Coumadin. No chest pain no shortness of breath.   Review of Systems No headache no chest pain fair appetite diminished energy ROS otherwise negative    Objective:   Physical Exam  Alert HEENT normal lungs rare wheeze no tachypnea heart regular rate and in rhythm currently ankles 1+ edema left anterior knee no bruising no deformity good range of motion      Assessment & Plan:  Impression 1 chronic pain patient absolutely needs her ongoing narcotics discussed. #2 hyperlipidemia status uncertain. #3 hypertension good control. #4 chronic joint pain ongoing severe see specialists #5 type 2 diabetes discussed #6 peripheral neuropathy discussed #7 asthma ongoing #8 anticoagulation stable at this time plan exercise encourage. Maintain same medications. Flu shot. Appropriate blood work. Further recommendations based results. Easily 40 minutes spent most in discussion of this extremely complex patient. [facility]

## 2013-04-13 ENCOUNTER — Telehealth: Payer: Self-pay | Admitting: Family Medicine

## 2013-04-13 NOTE — Telephone Encounter (Signed)
Patient would like to know if we can write her an Rx for prednisone until next Friday when she sees Dr Charlestine Night, because she states she can hardly walk today.   [place]

## 2013-04-13 NOTE — Telephone Encounter (Signed)
Notified patient no longer feel comfortable giving her pred, particulaly since her rheumatologist says she doesn't have polymyalgia rheumatic. Needs to take up with Dr. Charlestine Night. Patient verbalized understanding.

## 2013-04-13 NOTE — Telephone Encounter (Signed)
Note cont'd--needs to take up with [facility].

## 2013-04-13 NOTE — Telephone Encounter (Signed)
Just yesterday I told the pt I no longer feel comfortable giving her pred, particulaly since her rheumatologist says she doesn;t have polymyalgia rheumatic a. Needs to take up with

## 2013-04-19 ENCOUNTER — Telehealth (INDEPENDENT_AMBULATORY_CARE_PROVIDER_SITE_OTHER): Payer: Self-pay | Admitting: *Deleted

## 2013-04-19 ENCOUNTER — Telehealth: Payer: Self-pay | Admitting: Family Medicine

## 2013-04-19 MED ORDER — ONDANSETRON 4 MG PO TBDP: 4.0000 mg | ORAL_TABLET | Freq: Three times a day (TID) | ORAL | Status: DC | PRN

## 2013-04-19 NOTE — Telephone Encounter (Signed)
She has spoken with Dr. Karie Kirks. He called her in an antibiotic.

## 2013-04-19 NOTE — Telephone Encounter (Signed)
Rx faxed to [facility].  Patient notified.

## 2013-04-19 NOTE — Telephone Encounter (Signed)
[name] has had diarrhea for 7 days and a low grade fever with chills. Advised [name] if she had a fever with chills she needed to see her PCP. She asked that I send a message to [place]. If [name] could please return her call at [phone].

## 2013-04-19 NOTE — Telephone Encounter (Signed)
Patient has diarrhea and is nauseated. Dr Otelia Limes office is very busy today and patient was told to call primary doctor. No throwing up, but has a fever on and off. Please advise.  Patient is currently taking something for this that Dr Laural Golden gave her a long time ago, but is going to call back with the name of this.     [place]

## 2013-04-19 NOTE — Telephone Encounter (Signed)
zofr 4 0dt q 6 prn #28, cont dicyclomine

## 2013-04-19 NOTE — Telephone Encounter (Signed)
Patient says that the med she is currently taking is dicyclomine for this

## 2013-04-20 ENCOUNTER — Other Ambulatory Visit: Payer: Self-pay | Admitting: *Deleted

## 2013-04-20 ENCOUNTER — Encounter (HOSPITAL_COMMUNITY): Payer: Self-pay | Admitting: Emergency Medicine

## 2013-04-20 ENCOUNTER — Emergency Department (HOSPITAL_COMMUNITY)
Admission: EM | Admit: 2013-04-20 | Discharge: 2013-04-20 | Disposition: A | Discharge status: Home / Self Care | Payer: Medicare Other | Attending: Emergency Medicine | Admitting: Emergency Medicine

## 2013-04-20 DIAGNOSIS — Z860100 Personal history of colon polyps, unspecified: Secondary | ICD-10-CM | POA: Insufficient documentation

## 2013-04-20 DIAGNOSIS — E119 Type 2 diabetes mellitus without complications: Secondary | ICD-10-CM | POA: Insufficient documentation

## 2013-04-20 DIAGNOSIS — Z87891 Personal history of nicotine dependence: Secondary | ICD-10-CM | POA: Insufficient documentation

## 2013-04-20 DIAGNOSIS — I2510 Atherosclerotic heart disease of native coronary artery without angina pectoris: Secondary | ICD-10-CM | POA: Diagnosis not present

## 2013-04-20 DIAGNOSIS — R197 Diarrhea, unspecified: Secondary | ICD-10-CM | POA: Insufficient documentation

## 2013-04-20 DIAGNOSIS — Z90710 Acquired absence of both cervix and uterus: Secondary | ICD-10-CM | POA: Diagnosis not present

## 2013-04-20 DIAGNOSIS — Z86711 Personal history of pulmonary embolism: Secondary | ICD-10-CM | POA: Diagnosis not present

## 2013-04-20 DIAGNOSIS — I4891 Unspecified atrial fibrillation: Secondary | ICD-10-CM | POA: Insufficient documentation

## 2013-04-20 DIAGNOSIS — IMO0002 Reserved for concepts with insufficient information to code with codable children: Secondary | ICD-10-CM | POA: Insufficient documentation

## 2013-04-20 DIAGNOSIS — R5383 Other fatigue: Secondary | ICD-10-CM | POA: Insufficient documentation

## 2013-04-20 DIAGNOSIS — Z79899 Other long term (current) drug therapy: Secondary | ICD-10-CM | POA: Insufficient documentation

## 2013-04-20 DIAGNOSIS — Z9889 Other specified postprocedural states: Secondary | ICD-10-CM | POA: Insufficient documentation

## 2013-04-20 DIAGNOSIS — E039 Hypothyroidism, unspecified: Secondary | ICD-10-CM | POA: Diagnosis not present

## 2013-04-20 DIAGNOSIS — Z9089 Acquired absence of other organs: Secondary | ICD-10-CM | POA: Insufficient documentation

## 2013-04-20 DIAGNOSIS — G8929 Other chronic pain: Secondary | ICD-10-CM | POA: Insufficient documentation

## 2013-04-20 DIAGNOSIS — K589 Irritable bowel syndrome without diarrhea: Secondary | ICD-10-CM | POA: Diagnosis not present

## 2013-04-20 DIAGNOSIS — Z7901 Long term (current) use of anticoagulants: Secondary | ICD-10-CM | POA: Insufficient documentation

## 2013-04-20 DIAGNOSIS — E785 Hyperlipidemia, unspecified: Secondary | ICD-10-CM | POA: Diagnosis not present

## 2013-04-20 DIAGNOSIS — R5381 Other malaise: Secondary | ICD-10-CM | POA: Diagnosis not present

## 2013-04-20 DIAGNOSIS — Z8669 Personal history of other diseases of the nervous system and sense organs: Secondary | ICD-10-CM | POA: Diagnosis not present

## 2013-04-20 DIAGNOSIS — R112 Nausea with vomiting, unspecified: Secondary | ICD-10-CM | POA: Diagnosis not present

## 2013-04-20 DIAGNOSIS — Z862 Personal history of diseases of the blood and blood-forming organs and certain disorders involving the immune mechanism: Secondary | ICD-10-CM | POA: Insufficient documentation

## 2013-04-20 DIAGNOSIS — K529 Noninfective gastroenteritis and colitis, unspecified: Secondary | ICD-10-CM

## 2013-04-20 DIAGNOSIS — R509 Fever, unspecified: Secondary | ICD-10-CM | POA: Diagnosis not present

## 2013-04-20 LAB — CBC WITH DIFFERENTIAL/PLATELET
Eosinophils Absolute: 0.2 10*3/uL (ref 0.0–0.7)
HCT: 40.7 % (ref 36.0–46.0)
Hemoglobin: 12.8 g/dL (ref 12.0–15.0)
Lymphocytes Relative: 21 % (ref 12–46)
Lymphs Abs: 1.9 10*3/uL (ref 0.7–4.0)
MCH: 26.3 pg (ref 26.0–34.0)
Monocytes Relative: 7 % (ref 3–12)
Neutrophils Relative %: 70 % (ref 43–77)
Platelets: 356 10*3/uL (ref 150–400)
RBC: 4.86 MIL/uL (ref 3.87–5.11)

## 2013-04-20 LAB — BASIC METABOLIC PANEL
BUN: 8 mg/dL (ref 6–23)
Chloride: 102 mEq/L (ref 96–112)
GFR calc non Af Amer: 67 mL/min — ABNORMAL LOW (ref >90)
Glucose, Bld: 102 mg/dL — ABNORMAL HIGH (ref 70–99)
Potassium: 3.7 mEq/L (ref 3.5–5.1)

## 2013-04-20 LAB — OCCULT BLOOD, POC DEVICE: Fecal Occult Bld: NEGATIVE

## 2013-04-20 MED ORDER — DIPHENOXYLATE-ATROPINE 2.5-0.025 MG PO TABS
1.0000 | ORAL_TABLET | Freq: Once | ORAL | Status: AC
  Administered 2013-04-20: 1 via ORAL
  Filled 2013-04-20: qty 1

## 2013-04-20 MED ORDER — SODIUM CHLORIDE 0.9 % IV BOLUS (SEPSIS): 1000.0000 mL | Freq: Once | INTRAVENOUS | Status: DC

## 2013-04-20 MED ORDER — DIPHENOXYLATE-ATROPINE 2.5-0.025 MG PO TABS: 1.0000 | ORAL_TABLET | Freq: Two times a day (BID) | ORAL | Status: DC | PRN

## 2013-04-20 MED ORDER — DIPHENOXYLATE-ATROPINE 2.5-0.025 MG PO TABS: 1.0000 | ORAL_TABLET | Freq: Four times a day (QID) | ORAL | Status: DC | PRN

## 2013-04-20 MED ORDER — ONDANSETRON 8 MG PO TBDP
8.0000 mg | ORAL_TABLET | Freq: Once | ORAL | Status: AC
  Administered 2013-04-20: 8 mg via ORAL
  Filled 2013-04-20: qty 1

## 2013-04-20 NOTE — ED Notes (Addendum)
Diarrhea x 1 week. Averages about 8times a day per pt. Hx of abd problems. Alert/oriented. nad at this time. Denies pain. Mm moist. Denies vomiting but c/o nausea. Denies black or bloody stools.

## 2013-04-20 NOTE — ED Provider Notes (Signed)
Medical screening examination/treatment/procedure(s) were conducted as a shared visit with non-physician practitioner(s) and myself.  I personally evaluated the patient during the encounter.  EKG Interpretation   None       Results for orders placed during the hospital encounter of 04/20/13  GLUCOSE, CAPILLARY      Result Value Range   Glucose-Capillary 103 (*) 70 - 99 mg/dL  CBC WITH DIFFERENTIAL      Result Value Range   WBC 9.3  4.0 - 10.5 K/uL   RBC 4.86  3.87 - 5.11 MIL/uL   Hemoglobin 12.8  12.0 - 15.0 g/dL   HCT 40.7  36.0 - 46.0 %   MCV 83.7  78.0 - 100.0 fL   MCH 26.3  26.0 - 34.0 pg   MCHC 31.4  30.0 - 36.0 g/dL   RDW 16.3 (*) 11.5 - 15.5 %   Platelets 356  150 - 400 K/uL   Neutrophils Relative % 70  43 - 77 %   Neutro Abs 6.5  1.7 - 7.7 K/uL   Lymphocytes Relative 21  12 - 46 %   Lymphs Abs 1.9  0.7 - 4.0 K/uL   Monocytes Relative 7  3 - 12 %   Monocytes Absolute 0.6  0.1 - 1.0 K/uL   Eosinophils Relative 2  0 - 5 %   Eosinophils Absolute 0.2  0.0 - 0.7 K/uL   Basophils Relative 0  0 - 1 %   Basophils Absolute 0.0  0.0 - 0.1 K/uL  BASIC METABOLIC PANEL      Result Value Range   Sodium 141  135 - 145 mEq/L   Potassium 3.7  3.5 - 5.1 mEq/L   Chloride 102  96 - 112 mEq/L   CO2 29  19 - 32 mEq/L   Glucose, Bld 102 (*) 70 - 99 mg/dL   BUN 8  6 - 23 mg/dL   Creatinine, Ser 0.84  0.50 - 1.10 mg/dL   Calcium 9.9  8.4 - 10.5 mg/dL   GFR calc non Af Amer 67 (*) >90 mL/min   GFR calc Af Amer 77 (*) >90 mL/min  OCCULT BLOOD, POC DEVICE      Result Value Range   Fecal Occult Bld NEGATIVE  NEGATIVE     Patient seen by me. Patient's had diarrhea for a week. Going about 8 times a day. Patient no acute distress nontoxic.  Patient stable for discharge. Patient has followup with her record Dr. Evangeline Dakin labs without a significant leukocytosis no significant electrolyte abnormalities. Fecal cold blood was negative. Patient's abdomen is nonsurgical.  Mervin Kung,  MD 04/20/13 [phone]

## 2013-04-20 NOTE — ED Notes (Deleted)
Pt c/o nausea and diarrhea x1 week. Pt denies blood in stool, denies vomiting, abdominal pain, and chest pain. Pt states "everything I eat and drink goes right through me".

## 2013-04-20 NOTE — ED Notes (Signed)
Pt c/o nausea and diarrhea x1 week. Pt denies blood in stool, denies vomiting, abdominal pain, and chest pain. Pt states "everything I eat and drink goes right through me".

## 2013-04-26 NOTE — ED Provider Notes (Signed)
CSN: [id number]     Arrival date & time 04/20/13  0901 History   First MD Initiated Contact with Patient 04/20/13 [phone]     Chief Complaint  Patient presents with  . Diarrhea   (Consider location/radiation/quality/duration/timing/severity/associated sxs/prior Treatment) HPI Comments: [name] is a 72 y.o. Female presenting with a now 8 day history of acute on chronic diarrhea and abdominal cramping.  She has a history of IBS with intermittent episodes of worsened diarrhea with exacerbation of this condition.  She describes 8 day history of loose, watery, non bloody diarrhea,  Reporting approximately 8 episodes per day along with fecal incontinence with this exacerbation.  She denies current fevers or chills, but states she did have subjective fever earlier this week.  She has had no vomiting, but has mild nausea. She does report generalized abdominal cramping which improves after bowel movements.  She reports generalized fatigue without lightheadedness or dizziness.  She has bee pushing fluids at home and has taken no anti diarrheals at home.  She has not been on recent antibiotics.      The history is provided by the patient.    Past Medical History  Diagnosis Date  . ASCVD (arteriosclerotic cardiovascular disease)     stent to mid and proximal left anterior descending in 06/2002;drug eluting stent placed in the second diagnol in 08/2003 after  A  non-st elevation myocardial infarction   . Tobacco user     stopped   . Hyperlipidemia     pulmonary embolism 2000 and 09/2008  . Hypertension   . Pulmonary embolism     2000/09/2008  . Thyroid disease     hypothyroidism  . Allergy history unknown   . GERD (gastroesophageal reflux disease)   . Diabetes mellitus     no insulin  . Asthma   . FH: colonic polyps     adenomataous  . Anticoagulated   . Diarrhea     acute  . Rectal bleeding   . Pedal edema   . Irritable bowel syndrome   . Coronary artery disease   . Paroxysmal atrial  fibrillation     normal LV function; episodes occurred in 205 and 09/2007  . Hypothyroidism   . Peripheral edema   . Allergy   . Atrial fibrillation   . Chronic anticoagulation   . Venous stasis   . Insomnia   . Chronic pain of left knee    Past Surgical History  Procedure Laterality Date  . Cleft palate repair      4 surgeries  . Abdominal hysterectomy    . Appendectomy    . Cholecystectomy    . Heel spur surgery    . Knee surgery    . Breast reduction surgery    . Colonoscopy  2011    negative  . Oophorectomy    . Breast surgery     Family History  Problem Relation Age of Onset  . Heart attack Mother   . Diabetes Mother   . Hyperlipidemia Mother   . Heart attack Father   . Lung cancer Sister   . Lymphoma Brother    History  Substance Use Topics  . Smoking status: Former Smoker    Start date: 05/05/1987  . Smokeless tobacco: Never Used  . Alcohol Use: No   OB History   Grav Para Term Preterm Abortions TAB SAB Ect Mult Living                 Review of  Systems  Constitutional: Negative for fever.  HENT: Negative for congestion and sore throat.   Eyes: Negative.   Respiratory: Negative for chest tightness and shortness of breath.   Cardiovascular: Negative for chest pain.  Gastrointestinal: Positive for nausea, vomiting, abdominal pain and diarrhea.  Genitourinary: Negative.   Musculoskeletal: Negative for arthralgias, joint swelling and neck pain.  Skin: Negative.  Negative for rash and wound.  Neurological: Negative for dizziness, weakness, light-headedness, numbness and headaches.  Psychiatric/Behavioral: Negative.     Allergies  Demerol  Home Medications   Current Outpatient Rx  Name  Route  Sig  Dispense  Refill  . albuterol (PROAIR HFA) 108 (90 BASE) MCG/ACT inhaler   Inhalation   Inhale 2 puffs into the lungs every 4 (four) hours as needed. For shortness of breath          . Cholecalciferol (VITAMIN D3) 2000 UNITS TABS   Oral   Take 2  tablets by mouth daily.          . citalopram (CELEXA) 40 MG tablet      TAKE 1 TABLET ONCE DAILY.   30 tablet   2   . dicyclomine (BENTYL) 10 MG capsule   Oral   Take 10 mg by mouth 2 (two) times daily.          Marland Kitchen diltiazem (CARDIZEM CD) 300 MG 24 hr capsule   Oral   Take 300 mg by mouth every morning.         . diltiazem (CARDIZEM CD) 300 MG 24 hr capsule      TAKE 1 CAPSULE DAILY.   30 capsule   3   . fluticasone (FLOVENT HFA) 44 MCG/ACT inhaler   Inhalation   Inhale 2 puffs into the lungs daily as needed.          Marland Kitchen levothyroxine (SYNTHROID, LEVOTHROID) 50 MCG tablet      TAKE (1) TABLET BY MOUTH ONCE DAILY.   30 tablet   5   . montelukast (SINGULAIR) 10 MG tablet   Oral   Take 10 mg by mouth at bedtime.         . ondansetron (ZOFRAN ODT) 4 MG disintegrating tablet   Oral   Take 1 tablet (4 mg total) by mouth every 8 (eight) hours as needed for nausea.   28 tablet   0   . oxycodone (OXY-IR) 5 MG capsule      One TID prn pain   90 capsule   0     MAY FILL July 11, 2013   . pravastatin (PRAVACHOL) 20 MG tablet   Oral   Take 20 mg by mouth at bedtime.         . valsartan (DIOVAN) 320 MG tablet      TAKE (1) TABLET BY MOUTH ONCE DAILY.   30 tablet   3   . warfarin (COUMADIN) 5 MG tablet   Oral   Take 5-7.5 mg by mouth at bedtime.         . diphenoxylate-atropine (LOMOTIL) 2.5-0.025 MG per tablet   Oral   Take 1 tablet by mouth 2 (two) times daily as needed for diarrhea or loose stools.   60 tablet   0   . furosemide (LASIX) 80 MG tablet   Oral   Take 80 mg by mouth daily.         . potassium chloride SA (K-DUR,KLOR-CON) 20 MEQ tablet   Oral   Take 20 mEq by mouth daily.  BP 135/60  Pulse 63  Temp(Src) 98.2 F (36.8 C) (Oral)  Resp 22  SpO2 97% Physical Exam  Nursing note and vitals reviewed. Constitutional: She appears well-developed and well-nourished.  HENT:  Head: Normocephalic and atraumatic.   Eyes: Conjunctivae are normal.  Neck: Normal range of motion.  Cardiovascular: Normal rate, regular rhythm, normal heart sounds and intact distal pulses.   Pulmonary/Chest: Effort normal and breath sounds normal. She has no wheezes.  Abdominal: Soft. Bowel sounds are normal. She exhibits no distension. There is no tenderness. There is no rebound and no guarding.  Musculoskeletal: Normal range of motion.  Neurological: She is alert.  Skin: Skin is warm and dry.  Psychiatric: She has a normal mood and affect.    ED Course  Procedures (including critical care time) Labs Review Labs Reviewed  GLUCOSE, CAPILLARY - Abnormal; Notable for the following:    Glucose-Capillary 103 (*)    All other components within normal limits  CBC WITH DIFFERENTIAL - Abnormal; Notable for the following:    RDW 16.3 (*)    All other components within normal limits  BASIC METABOLIC PANEL - Abnormal; Notable for the following:    Glucose, Bld 102 (*)    GFR calc non Af Amer 67 (*)    GFR calc Af Amer 77 (*)    All other components within normal limits  OCCULT BLOOD, POC DEVICE   Imaging Review No results found.  EKG Interpretation   None       MDM   1. Chronic diarrhea   2. IBS (irritable bowel syndrome)    Pt was given zofran, tolerated PO fluids, also ate crackers and apple sauce while in ed and had no diarrhea while here.  Labs reviewed and stable.  She was given dose of lomotil prior to dc home, prescription for same.   Pt was seen by Dr. Rogene Houston prior to dc home.    Patients labs and/or radiological studies were viewed and considered during the medical decision making and disposition process.   Evalee Jefferson, PA-C 04/26/13 [place], PA-C 04/27/13 [phone]

## 2013-04-28 ENCOUNTER — Other Ambulatory Visit: Payer: Self-pay | Admitting: Family Medicine

## 2013-04-28 ENCOUNTER — Encounter: Payer: Self-pay | Admitting: Family Medicine

## 2013-04-28 ENCOUNTER — Ambulatory Visit (INDEPENDENT_AMBULATORY_CARE_PROVIDER_SITE_OTHER): Payer: Medicare Other | Admitting: Family Medicine

## 2013-04-28 VITALS — BP 130/70 | Ht 63.0 in | Wt 237.6 lb

## 2013-04-28 DIAGNOSIS — I4891 Unspecified atrial fibrillation: Secondary | ICD-10-CM | POA: Diagnosis not present

## 2013-04-28 DIAGNOSIS — E782 Mixed hyperlipidemia: Secondary | ICD-10-CM | POA: Diagnosis not present

## 2013-04-28 DIAGNOSIS — K5900 Constipation, unspecified: Secondary | ICD-10-CM | POA: Diagnosis not present

## 2013-04-28 DIAGNOSIS — E119 Type 2 diabetes mellitus without complications: Secondary | ICD-10-CM | POA: Diagnosis not present

## 2013-04-28 DIAGNOSIS — M353 Polymyalgia rheumatica: Secondary | ICD-10-CM | POA: Diagnosis not present

## 2013-04-28 DIAGNOSIS — Z79899 Other long term (current) drug therapy: Secondary | ICD-10-CM | POA: Diagnosis not present

## 2013-04-28 DIAGNOSIS — I10 Essential (primary) hypertension: Secondary | ICD-10-CM | POA: Diagnosis not present

## 2013-04-28 DIAGNOSIS — K589 Irritable bowel syndrome without diarrhea: Secondary | ICD-10-CM

## 2013-04-28 DIAGNOSIS — E785 Hyperlipidemia, unspecified: Secondary | ICD-10-CM | POA: Diagnosis not present

## 2013-04-28 DIAGNOSIS — Z7901 Long term (current) use of anticoagulants: Secondary | ICD-10-CM | POA: Diagnosis not present

## 2013-04-28 LAB — HEPATIC FUNCTION PANEL
AST: 16 U/L (ref 0–37)
Alkaline Phosphatase: 53 U/L (ref 39–117)
Bilirubin, Direct: 0.1 mg/dL (ref 0.0–0.3)
Total Bilirubin: 0.3 mg/dL (ref 0.3–1.2)

## 2013-04-28 LAB — BASIC METABOLIC PANEL
BUN: 13 mg/dL (ref 6–23)
CO2: 31 mEq/L (ref 19–32)
Calcium: 9.5 mg/dL (ref 8.4–10.5)
Creat: 0.89 mg/dL (ref 0.50–1.10)
Glucose, Bld: 92 mg/dL (ref 70–99)

## 2013-04-28 LAB — LIPID PANEL: HDL: 49 mg/dL (ref >39)

## 2013-04-28 NOTE — Progress Notes (Signed)
  Subjective:    Patient ID: [name], female    DOB: 1941-05-28, 72 y.o.   MRN: [id number]  HPI Patient is here today for a f/u from the ER on 10/28. She went to the ER for diarrhea. The diarrhea was loose frequent stools. Some cramping with it. No blood in the stool.  She now c/o constipation. After several doses of Lomotil.  Pt also states she needs a refill on Lomotil. She states she really would like to have some on hand just 4 occasional severe bouts of diarrhea.    Review of Systems No chest pain no blood in stools no fever no chills no change in urinary habits.    Objective:   Physical Exam  Alert no apparent distress. Lungs clear. Heart regular in rhythm. No [place]. Abdomen soft diffuse minimal tenderness no rebound no guarding no focal tenderness.      Assessment & Plan:  Impression status post protracted diarrhea now having gone in the other direction. Plan MiraLAX when necessary for constipation. Lomotil to keep on hand when necessary for other instances. WSL

## 2013-05-03 ENCOUNTER — Ambulatory Visit (INDEPENDENT_AMBULATORY_CARE_PROVIDER_SITE_OTHER): Payer: Medicare Other | Admitting: Internal Medicine

## 2013-05-03 ENCOUNTER — Encounter (INDEPENDENT_AMBULATORY_CARE_PROVIDER_SITE_OTHER): Payer: Self-pay | Admitting: Internal Medicine

## 2013-05-03 VITALS — BP 104/70 | HR 80 | Temp 98.3°F | Ht 63.0 in | Wt 234.9 lb

## 2013-05-03 DIAGNOSIS — K589 Irritable bowel syndrome without diarrhea: Secondary | ICD-10-CM

## 2013-05-03 NOTE — Patient Instructions (Signed)
Lomotil one tab daily. Increase fiber in diet

## 2013-05-03 NOTE — Progress Notes (Signed)
Subjective:     Patient ID: [name], female   DOB: 01-16-1941, 72 y.o.   MRN: [id number]  HPI Here today for f/u after recent visit to the ED for diarrhea. In the ED her stool was negative for blood.  She was having anywhere from 15-20 stools a day. Symptoms lasted x 7 days. She was given Lomotil in the ED and her symptoms resolved. She is not having any diarrhea now.  She is taking the Lomotil 1-3 a day. She had a fever with her symptoms. She tells me she also has a hx of constipation. She alternates between diarrhea and constipation. No melena or bright red rectal bleeding. Stools are yellow in color. Appetite is not good. She has lost 2 pounds since her last visit in May.  No abdominal pain today.    CBC    Component Value Date/Time   WBC 9.3 04/20/2013 1009   RBC 4.86 04/20/2013 1009   RBC 4.51 08/10/2011 0654   HGB 12.8 04/20/2013 1009   HCT 40.7 04/20/2013 1009   PLT 356 04/20/2013 1009   MCV 83.7 04/20/2013 1009   MCH 26.3 04/20/2013 1009   MCHC 31.4 04/20/2013 1009   RDW 16.3* 04/20/2013 1009   LYMPHSABS 1.9 04/20/2013 1009   MONOABS 0.6 04/20/2013 1009   EOSABS 0.2 04/20/2013 1009   BASOSABS 0.0 04/20/2013 1009   CMP     Component Value Date/Time   NA 141 04/28/2013 1141   K 4.8 04/28/2013 1141   CL 102 04/28/2013 1141   CO2 31 04/28/2013 1141   GLUCOSE 92 04/28/2013 1141   BUN 13 04/28/2013 1141   CREATININE 0.89 04/28/2013 1141   CREATININE 0.84 04/20/2013 1009   CALCIUM 9.5 04/28/2013 1141   PROT 6.1 04/28/2013 1141   ALBUMIN 3.6 04/28/2013 1141   AST 16 04/28/2013 1141   ALT 10 04/28/2013 1141   ALKPHOS 53 04/28/2013 1141   BILITOT 0.3 04/28/2013 1141   GFRNONAA 67* 04/20/2013 1009   GFRAA 77* 04/20/2013 1009      Her last colonoscopy was in 2010 incidental finding of sigmoid diverticulitis but no evidence of recurrent polyps. Normal EGD. Review of Systems see hpi Current Outpatient Prescriptions  Medication Sig Dispense Refill  . Cholecalciferol (VITAMIN  D3) 2000 UNITS TABS Take 2 tablets by mouth daily.       . citalopram (CELEXA) 40 MG tablet TAKE 1 TABLET ONCE DAILY.  30 tablet  2  . dicyclomine (BENTYL) 10 MG capsule Take 10 mg by mouth 2 (two) times daily.       Marland Kitchen diltiazem (CARDIZEM CD) 300 MG 24 hr capsule TAKE 1 CAPSULE DAILY.  30 capsule  3  . diphenoxylate-atropine (LOMOTIL) 2.5-0.025 MG per tablet Take 1 tablet by mouth 2 (two) times daily as needed for diarrhea or loose stools.  60 tablet  0  . fluticasone (FLOVENT HFA) 44 MCG/ACT inhaler Inhale 2 puffs into the lungs daily as needed.       Marland Kitchen levothyroxine (SYNTHROID, LEVOTHROID) 50 MCG tablet TAKE (1) TABLET BY MOUTH ONCE DAILY.  30 tablet  5  . montelukast (SINGULAIR) 10 MG tablet Take 10 mg by mouth at bedtime.      . ondansetron (ZOFRAN ODT) 4 MG disintegrating tablet Take 1 tablet (4 mg total) by mouth every 8 (eight) hours as needed for nausea.  28 tablet  0  . oxycodone (OXY-IR) 5 MG capsule One TID prn pain  90 capsule  0  . pravastatin (PRAVACHOL)  20 MG tablet Take 20 mg by mouth at bedtime.      . valsartan (DIOVAN) 320 MG tablet TAKE (1) TABLET BY MOUTH ONCE DAILY.  30 tablet  3  . warfarin (COUMADIN) 5 MG tablet Take 5-7.5 mg by mouth at bedtime. 5mg  all week Sat she takes 7.5,      . furosemide (LASIX) 80 MG tablet Take 80 mg by mouth daily.      . potassium chloride SA (K-DUR,KLOR-CON) 20 MEQ tablet Take 20 mEq by mouth daily.      . [DISCONTINUED] ferrous sulfate 325 (65 FE) MG tablet Take 1 tablet (325 mg total) by mouth daily with breakfast. IRON SUPPLEMENT.      . [DISCONTINUED] glyBURIDE (DIABETA) 5 MG tablet Take 2.5 mg by mouth daily with breakfast.       . [DISCONTINUED] ipratropium (ATROVENT) 0.02 % nebulizer solution Take 500 mcg by nebulization every 4 (four) hours as needed. For shortness of breath       No current facility-administered medications for this visit.   Past Medical History  Diagnosis Date  . ASCVD (arteriosclerotic cardiovascular disease)      stent to mid and proximal left anterior descending in 06/2002;drug eluting stent placed in the second diagnol in 08/2003 after  A  non-st elevation myocardial infarction   . Tobacco user     stopped   . Hyperlipidemia     pulmonary embolism 2000 and 09/2008  . Hypertension   . Pulmonary embolism     2000/09/2008  . Thyroid disease     hypothyroidism  . Allergy history unknown   . GERD (gastroesophageal reflux disease)   . Diabetes mellitus     no insulin  . Asthma   . FH: colonic polyps     adenomataous  . Anticoagulated   . Diarrhea     acute  . Rectal bleeding   . Pedal edema   . Irritable bowel syndrome   . Coronary artery disease   . Paroxysmal atrial fibrillation     normal LV function; episodes occurred in 205 and 09/2007  . Hypothyroidism   . Peripheral edema   . Allergy   . Atrial fibrillation   . Chronic anticoagulation   . Venous stasis   . Insomnia   . Chronic pain of left knee    Past Surgical History  Procedure Laterality Date  . Cleft palate repair      4 surgeries  . Abdominal hysterectomy    . Appendectomy    . Cholecystectomy    . Heel spur surgery    . Knee surgery    . Breast reduction surgery    . Colonoscopy  2011    negative  . Oophorectomy    . Breast surgery     Allergies  Allergen Reactions  . Demerol Nausea And Vomiting        Objective:   Physical Exam  Filed Vitals:   05/03/13 1002  BP: 104/70  Pulse: 80  Temp: 98.3 F (36.8 C)  Height: 5\' 3"  (1.6 m)  Weight: 234 lb 14.4 oz (106.55 kg)  Alert and oriented. Skin warm and dry. Oral mucosa is moist.   . Sclera anicteric, conjunctivae is pink. Thyroid not enlarged. No cervical lymphadenopathy. Lungs clear. Heart regular rate and rhythm.  Abdomen is soft. Bowel sounds are positive. No hepatomegaly. No abdominal masses felt. No tenderness. Abdomen obese  No edema to lower extremities.        Assessment:  Diarrhea/constipation: with known hx of IBS.  Last colonoscopy in 2010  and was normal.  Plan:    Take Lomotil one a day for your diarrhea. Increase fiber in your diet. OV in 1 yr.

## 2013-05-06 DIAGNOSIS — M064 Inflammatory polyarthropathy: Secondary | ICD-10-CM | POA: Diagnosis not present

## 2013-05-06 DIAGNOSIS — M25539 Pain in unspecified wrist: Secondary | ICD-10-CM | POA: Diagnosis not present

## 2013-05-06 DIAGNOSIS — IMO0002 Reserved for concepts with insufficient information to code with codable children: Secondary | ICD-10-CM | POA: Diagnosis not present

## 2013-05-06 DIAGNOSIS — M25549 Pain in joints of unspecified hand: Secondary | ICD-10-CM | POA: Diagnosis not present

## 2013-05-07 ENCOUNTER — Other Ambulatory Visit: Payer: Self-pay | Admitting: Family Medicine

## 2013-05-07 ENCOUNTER — Encounter: Payer: Self-pay | Admitting: Family Medicine

## 2013-05-07 DIAGNOSIS — E782 Mixed hyperlipidemia: Secondary | ICD-10-CM | POA: Diagnosis not present

## 2013-05-07 DIAGNOSIS — I10 Essential (primary) hypertension: Secondary | ICD-10-CM | POA: Diagnosis not present

## 2013-05-07 DIAGNOSIS — Z79899 Other long term (current) drug therapy: Secondary | ICD-10-CM | POA: Diagnosis not present

## 2013-05-07 DIAGNOSIS — E119 Type 2 diabetes mellitus without complications: Secondary | ICD-10-CM | POA: Diagnosis not present

## 2013-05-07 DIAGNOSIS — M25549 Pain in joints of unspecified hand: Secondary | ICD-10-CM | POA: Diagnosis not present

## 2013-05-07 DIAGNOSIS — Z7901 Long term (current) use of anticoagulants: Secondary | ICD-10-CM | POA: Diagnosis not present

## 2013-05-07 DIAGNOSIS — I4891 Unspecified atrial fibrillation: Secondary | ICD-10-CM | POA: Diagnosis not present

## 2013-05-07 DIAGNOSIS — E785 Hyperlipidemia, unspecified: Secondary | ICD-10-CM | POA: Diagnosis not present

## 2013-05-07 DIAGNOSIS — M353 Polymyalgia rheumatica: Secondary | ICD-10-CM | POA: Diagnosis not present

## 2013-05-10 ENCOUNTER — Other Ambulatory Visit: Payer: Self-pay | Admitting: Family Medicine

## 2013-05-13 ENCOUNTER — Other Ambulatory Visit: Payer: Self-pay | Admitting: *Deleted

## 2013-05-14 ENCOUNTER — Telehealth: Payer: Self-pay | Admitting: Family Medicine

## 2013-05-14 ENCOUNTER — Other Ambulatory Visit: Payer: Self-pay | Admitting: *Deleted

## 2013-05-14 MED ORDER — DIPHENOXYLATE-ATROPINE 2.5-0.025 MG PO TABS: 1.0000 | ORAL_TABLET | Freq: Two times a day (BID) | ORAL | Status: DC | PRN

## 2013-05-14 NOTE — Telephone Encounter (Signed)
Medication faxed to pharmacy. Patient was notified.

## 2013-05-14 NOTE — Telephone Encounter (Signed)
Ok 6 ref

## 2013-05-14 NOTE — Telephone Encounter (Signed)
Patient would like a refill on diphenoxylate-atropine (LOMOTIL) 2.5-0.025 MG per tablet.  States to get her through [facility]

## 2013-05-17 ENCOUNTER — Encounter: Payer: Self-pay | Admitting: Family Medicine

## 2013-05-17 ENCOUNTER — Ambulatory Visit (INDEPENDENT_AMBULATORY_CARE_PROVIDER_SITE_OTHER): Payer: Medicare Other | Admitting: Family Medicine

## 2013-05-17 VITALS — BP 118/70 | Temp 99.0°F | Ht 63.0 in | Wt 234.0 lb

## 2013-05-17 DIAGNOSIS — Z7901 Long term (current) use of anticoagulants: Secondary | ICD-10-CM | POA: Diagnosis not present

## 2013-05-17 DIAGNOSIS — J209 Acute bronchitis, unspecified: Secondary | ICD-10-CM

## 2013-05-17 LAB — POCT INR: INR: 4.9

## 2013-05-17 MED ORDER — PREDNISONE 10 MG PO TABS: ORAL_TABLET | ORAL | Status: DC

## 2013-05-17 MED ORDER — LEVOFLOXACIN 500 MG PO TABS: 500.0000 mg | ORAL_TABLET | Freq: Every day | ORAL | Status: DC

## 2013-05-17 NOTE — Progress Notes (Signed)
  Subjective:    Patient ID: [name], female    DOB: 01-Nov-1940, 72 y.o.   MRN: [id number]  Wheezing  This is a new problem. The current episode started in the past 7 days. She has tried steroid inhaler for the symptoms.   INR today 4.9. Patient takes coumadin 5mg   every day except sat she takes 7.5 mg.   More cough and congestion the last several days. Productive yellowish phlegm at times. No fever. No headache no sore throat no chest pain. Fair appetite Review of Systems  Respiratory: Positive for wheezing.    No vomiting no diarrhea no rash ROS otherwise negative    Objective:   Physical Exam Alert mild malaise. HEENT some congestion. Lungs bilateral wheezes no tachypnea heart regular in rhythm pharynx normal       Assessment & Plan:  Impression 1 exacerbation of asthma with bronchitis #2 excess anticoagulation with high INR. Plan Coumadin adjusted. Antibiotics prescribed prednisone prescribed. Followup in one week with INR. WSL

## 2013-05-17 NOTE — Patient Instructions (Addendum)
Skip today, then take one half tablet on Tuesday, one tablet on Wednesday, Thursday, Friday, half tablet on Saturday, one tablet on Sunday. Recheck on Monday December 1st.

## 2013-05-19 ENCOUNTER — Telehealth: Payer: Self-pay | Admitting: Family Medicine

## 2013-05-19 NOTE — Telephone Encounter (Signed)
Patient came in on 05/17/13-Patient is still having wheezing really bad and cant get a deep breath, she is using her nebulizer, but is having no relief. Please advise.   [employer]

## 2013-05-19 NOTE — Telephone Encounter (Signed)
Pt was seen 10/24 on levaquin and prednisone using nebulizer q 4 hours. Breathing is worse at night and first thing  in the am nebulizer is helping. Low grade fever last night.

## 2013-05-19 NOTE — Telephone Encounter (Signed)
Pt was seen 05/17/13. Discussed with Dr. Nicki Reaper. Pt to use nebulizer q 3-4 and to call back if not better.

## 2013-05-19 NOTE — Telephone Encounter (Signed)
NTC- no easy answers, safest thiung is for [place] to see her

## 2013-05-24 ENCOUNTER — Ambulatory Visit (INDEPENDENT_AMBULATORY_CARE_PROVIDER_SITE_OTHER): Payer: Medicare Other

## 2013-05-24 ENCOUNTER — Ambulatory Visit (INDEPENDENT_AMBULATORY_CARE_PROVIDER_SITE_OTHER): Payer: Medicare Other | Admitting: Internal Medicine

## 2013-05-24 ENCOUNTER — Encounter (INDEPENDENT_AMBULATORY_CARE_PROVIDER_SITE_OTHER): Payer: Self-pay | Admitting: Internal Medicine

## 2013-05-24 VITALS — BP 138/56 | HR 60 | Temp 98.0°F | Ht 63.0 in | Wt 236.0 lb

## 2013-05-24 DIAGNOSIS — K589 Irritable bowel syndrome without diarrhea: Secondary | ICD-10-CM | POA: Diagnosis not present

## 2013-05-24 DIAGNOSIS — Z7901 Long term (current) use of anticoagulants: Secondary | ICD-10-CM

## 2013-05-24 LAB — POCT INR: INR: 4

## 2013-05-24 NOTE — Patient Instructions (Signed)
Take 1/2 tablet on Mon, Wed, Friday and 1 tablet all other days. Recheck next Monday.

## 2013-05-24 NOTE — Progress Notes (Signed)
Subjective:     Patient ID: [name], female   DOB: [birth year], 72 y.o.   MRN: [id number]  HPIHere today for f/u of her diarrhea.  She tells me she is doing much better. She says she has not taken a Lomotil in 4 days.  She had a small BM this am. She has a hx of irritable.  There has been no more diarrhea. She usually has a BM about 2-3 a day. Now she is more constipated. She takes Lomotil on a prn basis. Appetite is good. No weight loss. No melena or bright red rectal bleeding. She is exercising some by walking. She walks with a walker. 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     Her last colonoscopy was in 2010 incidental finding of sigmoid diverticulitis but no evidence of recurrent polyps. Normal EGD.  Review of Systems see hpi Current Outpatient Prescriptions  Medication Sig Dispense Refill  . Cholecalciferol (VITAMIN D3) 2000 UNITS TABS Take 2 tablets by mouth daily.       . citalopram (CELEXA) 40 MG tablet TAKE 1 TABLET ONCE DAILY.  30 tablet  2  . diltiazem (CARDIZEM CD) 300 MG 24 hr capsule TAKE 1 CAPSULE DAILY.  30 capsule  3  . diphenoxylate-atropine (LOMOTIL) 2.5-0.025 MG per tablet Take 1 tablet by mouth 2 (two) times daily as needed for diarrhea or loose stools.  60 tablet  5  . fluticasone (FLOVENT HFA) 44 MCG/ACT inhaler Inhale 2 puffs into the lungs daily as needed.       . furosemide (LASIX) 40 MG tablet daily      . levothyroxine (SYNTHROID, LEVOTHROID) 50 MCG tablet TAKE (1) TABLET BY MOUTH ONCE DAILY.  30 tablet  5  . montelukast (SINGULAIR) 10 MG tablet Take 10 mg by mouth at bedtime.      . ondansetron (ZOFRAN ODT) 4 MG disintegrating tablet Take 1 tablet (4 mg total) by mouth every 8 (eight) hours as needed for nausea.  28 tablet  0  . oxycodone (OXY-IR) 5 MG capsule One TID prn pain  90 capsule  0  . potassium chloride SA (K-DUR,KLOR-CON) 20 MEQ tablet Take 20 mEq by mouth daily.      . pravastatin (PRAVACHOL) 20 MG tablet Take 20 mg by mouth at bedtime.      .  predniSONE (DELTASONE) 10 MG tablet Take 4 tablets every day for 3 days, then take 3 every day for 3 days, then take 2 every day for 2 days  25 tablet  0  . PREDNISONE PO Take by mouth.      Marland Kitchen PROAIR HFA 108 (90 BASE) MCG/ACT inhaler INHALE 2 PUFFS UP TO 4 TIMES DAILY AS NEEDED FOR WHEEZING.  8.5 g  5  . valsartan (DIOVAN) 320 MG tablet TAKE (1) TABLET BY MOUTH ONCE DAILY.  30 tablet  3  . warfarin (COUMADIN) 5 MG tablet Take 5-7.5 mg by mouth at bedtime. 5mg  all week Sat she takes 7.5,      . dicyclomine (BENTYL) 10 MG capsule Take 10 mg by mouth 2 (two) times daily.       . [DISCONTINUED] ferrous sulfate 325 (65 FE) MG tablet Take 1 tablet (325 mg total) by mouth daily with breakfast. IRON SUPPLEMENT.      . [DISCONTINUED] glyBURIDE (DIABETA) 5 MG tablet Take 2.5 mg by mouth daily with breakfast.       . [DISCONTINUED] ipratropium (ATROVENT) 0.02 % nebulizer solution Take 500 mcg by nebulization every 4 (  four) hours as needed. For shortness of breath       No current facility-administered medications for this visit.   Past Medical History  Diagnosis Date  . ASCVD (arteriosclerotic cardiovascular disease)     stent to mid and proximal left anterior descending in 06/2002;drug eluting stent placed in the second diagnol in 08/2003 after  A  non-st elevation myocardial infarction   . Tobacco user     stopped   . Hyperlipidemia     pulmonary embolism 2000 and 09/2008  . Hypertension   . Pulmonary embolism     2000/09/2008  . Thyroid disease     hypothyroidism  . Allergy history unknown   . GERD (gastroesophageal reflux disease)   . Diabetes mellitus     no insulin  . Asthma   . FH: colonic polyps     adenomataous  . Anticoagulated   . Diarrhea     acute  . Rectal bleeding   . Pedal edema   . Irritable bowel syndrome   . Coronary artery disease   . Paroxysmal atrial fibrillation     normal LV function; episodes occurred in 205 and 09/2007  . Hypothyroidism   . Peripheral edema   .  Allergy   . Atrial fibrillation   . Chronic anticoagulation   . Venous stasis   . Insomnia   . Chronic pain of left knee    Past Surgical History  Procedure Laterality Date  . Cleft palate repair      4 surgeries  . Abdominal hysterectomy    . Appendectomy    . Cholecystectomy    . Heel spur surgery    . Knee surgery    . Breast reduction surgery    . Colonoscopy  2011    negative  . Oophorectomy    . Breast surgery     Allergies  Allergen Reactions  . Demerol Nausea And Vomiting        Objective:   Physical Exam  Filed Vitals:   05/24/13 1415  BP: 138/56  Pulse: 60  Temp: 98 F (36.7 C)  Height: 5\' 3"  (1.6 m)  Weight: 236 lb (107.049 kg)   Alert and oriented. Skin warm and dry. Oral mucosa is moist.   . Sclera anicteric, conjunctivae is pink. Thyroid not enlarged. No cervical lymphadenopathy. Lungs clear. Heart regular rate and rhythm.  Abdomen is soft. Bowel sounds are positive. Abdomen is obese. No hepatomegaly. No abdominal masses felt. No tenderness.  No edema to lower extremities.       Assessment:    Hx of IBS. No diarrhea at this time. She tells me her BMs are normal at this time. Usually on the constipated side.   Hx of tubular adenoma. Plan:   PRN Lomotil. Increase fiber in diet. OV in one year. Will consider colonoscopy her next office visit. Hx of tubular adenoma.

## 2013-05-24 NOTE — Patient Instructions (Signed)
OV in 1 yr. Increase fiber in diet. Will consider colonoscopy next OV.

## 2013-05-31 ENCOUNTER — Ambulatory Visit (INDEPENDENT_AMBULATORY_CARE_PROVIDER_SITE_OTHER): Payer: Medicare Other | Admitting: *Deleted

## 2013-05-31 DIAGNOSIS — Z7901 Long term (current) use of anticoagulants: Secondary | ICD-10-CM | POA: Diagnosis not present

## 2013-05-31 LAB — POCT INR: INR: 4

## 2013-06-03 ENCOUNTER — Other Ambulatory Visit (HOSPITAL_COMMUNITY): Payer: Self-pay | Admitting: Rheumatology

## 2013-06-03 ENCOUNTER — Ambulatory Visit (HOSPITAL_COMMUNITY)
Admission: RE | Admit: 2013-06-03 | Discharge: 2013-06-03 | Disposition: A | Discharge status: Home / Self Care | Payer: Medicare Other | Source: Ambulatory Visit | Attending: Rheumatology | Admitting: Rheumatology

## 2013-06-03 DIAGNOSIS — R079 Chest pain, unspecified: Secondary | ICD-10-CM

## 2013-06-03 DIAGNOSIS — M549 Dorsalgia, unspecified: Secondary | ICD-10-CM

## 2013-06-03 DIAGNOSIS — M8448XA Pathological fracture, other site, initial encounter for fracture: Secondary | ICD-10-CM | POA: Diagnosis not present

## 2013-06-03 DIAGNOSIS — R0602 Shortness of breath: Secondary | ICD-10-CM

## 2013-06-03 DIAGNOSIS — M899 Disorder of bone, unspecified: Secondary | ICD-10-CM | POA: Diagnosis not present

## 2013-06-03 DIAGNOSIS — M25549 Pain in joints of unspecified hand: Secondary | ICD-10-CM | POA: Diagnosis not present

## 2013-06-03 DIAGNOSIS — M546 Pain in thoracic spine: Secondary | ICD-10-CM | POA: Diagnosis not present

## 2013-06-03 DIAGNOSIS — IMO0001 Reserved for inherently not codable concepts without codable children: Secondary | ICD-10-CM | POA: Diagnosis not present

## 2013-06-03 DIAGNOSIS — Z79899 Other long term (current) drug therapy: Secondary | ICD-10-CM | POA: Diagnosis not present

## 2013-06-10 ENCOUNTER — Ambulatory Visit (INDEPENDENT_AMBULATORY_CARE_PROVIDER_SITE_OTHER): Payer: Medicare Other | Admitting: *Deleted

## 2013-06-10 DIAGNOSIS — Z7901 Long term (current) use of anticoagulants: Secondary | ICD-10-CM

## 2013-06-10 NOTE — Patient Instructions (Signed)
1/2 tab on Monday, Wednesday, Friday, and Saturday 1 tab all other days. Recheck in 3 weeks.

## 2013-06-17 ENCOUNTER — Emergency Department (HOSPITAL_COMMUNITY)
Admission: EM | Admit: 2013-06-17 | Discharge: 2013-06-17 | Disposition: A | Discharge status: Home / Self Care | Payer: Medicare Other | Attending: Emergency Medicine | Admitting: Emergency Medicine

## 2013-06-17 ENCOUNTER — Emergency Department (HOSPITAL_COMMUNITY): Payer: Medicare Other

## 2013-06-17 ENCOUNTER — Encounter (HOSPITAL_COMMUNITY): Payer: Self-pay | Admitting: Emergency Medicine

## 2013-06-17 DIAGNOSIS — G8929 Other chronic pain: Secondary | ICD-10-CM | POA: Diagnosis not present

## 2013-06-17 DIAGNOSIS — Z860100 Personal history of colon polyps, unspecified: Secondary | ICD-10-CM | POA: Insufficient documentation

## 2013-06-17 DIAGNOSIS — IMO0002 Reserved for concepts with insufficient information to code with codable children: Secondary | ICD-10-CM | POA: Diagnosis not present

## 2013-06-17 DIAGNOSIS — Z79899 Other long term (current) drug therapy: Secondary | ICD-10-CM | POA: Diagnosis not present

## 2013-06-17 DIAGNOSIS — Z9861 Coronary angioplasty status: Secondary | ICD-10-CM | POA: Insufficient documentation

## 2013-06-17 DIAGNOSIS — E119 Type 2 diabetes mellitus without complications: Secondary | ICD-10-CM | POA: Diagnosis not present

## 2013-06-17 DIAGNOSIS — Z86711 Personal history of pulmonary embolism: Secondary | ICD-10-CM | POA: Diagnosis not present

## 2013-06-17 DIAGNOSIS — R110 Nausea: Secondary | ICD-10-CM | POA: Insufficient documentation

## 2013-06-17 DIAGNOSIS — Z87891 Personal history of nicotine dependence: Secondary | ICD-10-CM | POA: Insufficient documentation

## 2013-06-17 DIAGNOSIS — I10 Essential (primary) hypertension: Secondary | ICD-10-CM | POA: Insufficient documentation

## 2013-06-17 DIAGNOSIS — K589 Irritable bowel syndrome without diarrhea: Secondary | ICD-10-CM | POA: Diagnosis not present

## 2013-06-17 DIAGNOSIS — I4891 Unspecified atrial fibrillation: Secondary | ICD-10-CM | POA: Diagnosis not present

## 2013-06-17 DIAGNOSIS — K219 Gastro-esophageal reflux disease without esophagitis: Secondary | ICD-10-CM | POA: Diagnosis not present

## 2013-06-17 DIAGNOSIS — E039 Hypothyroidism, unspecified: Secondary | ICD-10-CM | POA: Insufficient documentation

## 2013-06-17 DIAGNOSIS — Z7901 Long term (current) use of anticoagulants: Secondary | ICD-10-CM | POA: Insufficient documentation

## 2013-06-17 DIAGNOSIS — I2510 Atherosclerotic heart disease of native coronary artery without angina pectoris: Secondary | ICD-10-CM | POA: Diagnosis not present

## 2013-06-17 DIAGNOSIS — J45901 Unspecified asthma with (acute) exacerbation: Secondary | ICD-10-CM | POA: Diagnosis not present

## 2013-06-17 DIAGNOSIS — E785 Hyperlipidemia, unspecified: Secondary | ICD-10-CM | POA: Insufficient documentation

## 2013-06-17 DIAGNOSIS — R0989 Other specified symptoms and signs involving the circulatory and respiratory systems: Secondary | ICD-10-CM | POA: Diagnosis not present

## 2013-06-17 DIAGNOSIS — J069 Acute upper respiratory infection, unspecified: Secondary | ICD-10-CM | POA: Insufficient documentation

## 2013-06-17 DIAGNOSIS — R0602 Shortness of breath: Secondary | ICD-10-CM | POA: Diagnosis not present

## 2013-06-17 MED ORDER — ALBUTEROL SULFATE (5 MG/ML) 0.5% IN NEBU
5.0000 mg | INHALATION_SOLUTION | Freq: Once | RESPIRATORY_TRACT | Status: AC
  Administered 2013-06-17: 5 mg via RESPIRATORY_TRACT
  Filled 2013-06-17: qty 1

## 2013-06-17 MED ORDER — IPRATROPIUM BROMIDE 0.02 % IN SOLN
0.5000 mg | Freq: Once | RESPIRATORY_TRACT | Status: AC
  Administered 2013-06-17: 0.5 mg via RESPIRATORY_TRACT
  Filled 2013-06-17: qty 2.5

## 2013-06-17 MED ORDER — PREDNISONE 20 MG PO TABS: ORAL_TABLET | ORAL | Status: DC

## 2013-06-17 MED ORDER — ONDANSETRON 8 MG PO TBDP
8.0000 mg | ORAL_TABLET | Freq: Once | ORAL | Status: AC
  Administered 2013-06-17: 8 mg via ORAL
  Filled 2013-06-17: qty 1

## 2013-06-17 MED ORDER — PREDNISONE 20 MG PO TABS
40.0000 mg | ORAL_TABLET | Freq: Once | ORAL | Status: AC
  Administered 2013-06-17: 40 mg via ORAL
  Filled 2013-06-17: qty 2

## 2013-06-17 NOTE — ED Notes (Signed)
Pt reports "suddenly getting sick" today at 2pm with cough and congestion. No fever, +nausea at times.

## 2013-06-17 NOTE — ED Notes (Signed)
Respiratory paged for nebulizer treatment

## 2013-06-17 NOTE — ED Provider Notes (Signed)
CSN: [id number]     Arrival date & time 06/17/13  1729 History   First MD Initiated Contact with Patient 06/17/13 1733     This chart was scribed for Nat Christen, MD by Era Bumpers, ED scribe. This patient was seen in room APA08/APA08 and the patient's care was started at 1733.  Chief Complaint  Patient presents with  . Shortness of Breath  . Nausea  . Cough   The history is provided by the patient. No language interpreter was used.   HPI Comments: [name] is a 72 y.o. female who presents to the Emergency Department by ambulance w/hx of asthma complaining of constant cough and wheezing, onset this AM. She had x1 breathing tx by EMS en route w/improvemed wheezing. She reports felt fine yesterday but this AM began w/cough and wheezing. She is currently on prednisone 7mg . She reports asthma sx are worse w/recent cold weather. She uses her at home nebulizer for occasional asthma flare ups. She does not smoke. She lives at home alone. PCP Dr. Wolfgang Phoenix.   Past Medical History  Diagnosis Date  . ASCVD (arteriosclerotic cardiovascular disease)     stent to mid and proximal left anterior descending in 06/2002;drug eluting stent placed in the second diagnol in 08/2003 after  A  non-st elevation myocardial infarction   . Tobacco user     stopped   . Hyperlipidemia     pulmonary embolism 2000 and 09/2008  . Hypertension   . Pulmonary embolism     2000/09/2008  . Thyroid disease     hypothyroidism  . Allergy history unknown   . GERD (gastroesophageal reflux disease)   . Diabetes mellitus     no insulin  . Asthma   . FH: colonic polyps     adenomataous  . Anticoagulated   . Diarrhea     acute  . Rectal bleeding   . Pedal edema   . Irritable bowel syndrome   . Coronary artery disease   . Paroxysmal atrial fibrillation     normal LV function; episodes occurred in 205 and 09/2007  . Hypothyroidism   . Peripheral edema   . Allergy   . Atrial fibrillation   . Chronic anticoagulation   .  Venous stasis   . Insomnia   . Chronic pain of left knee    Past Surgical History  Procedure Laterality Date  . Cleft palate repair      4 surgeries  . Abdominal hysterectomy    . Appendectomy    . Cholecystectomy    . Heel spur surgery    . Knee surgery    . Breast reduction surgery    . Colonoscopy  2011    negative  . Oophorectomy    . Breast surgery     Family History  Problem Relation Age of Onset  . Heart attack Mother   . Diabetes Mother   . Hyperlipidemia Mother   . Heart attack Father   . Lung cancer Sister   . Lymphoma Brother    History  Substance Use Topics  . Smoking status: Former Smoker    Start date: 05/05/1987  . Smokeless tobacco: Never Used  . Alcohol Use: No   OB History   Grav Para Term Preterm Abortions TAB SAB Ect Mult Living                 Review of Systems  Constitutional: Negative for fever and chills.  HENT: Negative for congestion and rhinorrhea.  Respiratory: Positive for cough, shortness of breath and wheezing.   Cardiovascular: Negative for chest pain.  Gastrointestinal: Negative for nausea, vomiting, abdominal pain and diarrhea.  Musculoskeletal: Negative for back pain.  Skin: Negative for color change and rash.  Neurological: Negative for syncope.  All other systems reviewed and are negative.   A complete 10 system review of systems was obtained and all systems are negative except as noted in the HPI and PMH.   Allergies  Demerol  Home Medications   Current Outpatient Rx  Name  Route  Sig  Dispense  Refill  . Cholecalciferol (VITAMIN D3) 2000 UNITS TABS   Oral   Take 2 tablets by mouth daily.          . citalopram (CELEXA) 40 MG tablet      TAKE 1 TABLET ONCE DAILY.   30 tablet   2   . dicyclomine (BENTYL) 10 MG capsule   Oral   Take 10 mg by mouth 2 (two) times daily.          Marland Kitchen diltiazem (CARDIZEM CD) 300 MG 24 hr capsule      TAKE 1 CAPSULE DAILY.   30 capsule   3   . diphenoxylate-atropine  (LOMOTIL) 2.5-0.025 MG per tablet   Oral   Take 1 tablet by mouth 2 (two) times daily as needed for diarrhea or loose stools.   60 tablet   5   . fluticasone (FLOVENT HFA) 44 MCG/ACT inhaler   Inhalation   Inhale 2 puffs into the lungs daily as needed.          . furosemide (LASIX) 40 MG tablet      daily         . levothyroxine (SYNTHROID, LEVOTHROID) 50 MCG tablet      TAKE (1) TABLET BY MOUTH ONCE DAILY.   30 tablet   5   . montelukast (SINGULAIR) 10 MG tablet   Oral   Take 10 mg by mouth at bedtime.         . ondansetron (ZOFRAN ODT) 4 MG disintegrating tablet   Oral   Take 1 tablet (4 mg total) by mouth every 8 (eight) hours as needed for nausea.   28 tablet   0   . oxycodone (OXY-IR) 5 MG capsule      One TID prn pain   90 capsule   0     MAY FILL July 11, 2013   . potassium chloride SA (K-DUR,KLOR-CON) 20 MEQ tablet   Oral   Take 20 mEq by mouth daily.         . pravastatin (PRAVACHOL) 20 MG tablet   Oral   Take 20 mg by mouth at bedtime.         . predniSONE (DELTASONE) 10 MG tablet      Take 4 tablets every day for 3 days, then take 3 every day for 3 days, then take 2 every day for 2 days   25 tablet   0   . PREDNISONE PO   Oral   Take by mouth.         Marland Kitchen PROAIR HFA 108 (90 BASE) MCG/ACT inhaler      INHALE 2 PUFFS UP TO 4 TIMES DAILY AS NEEDED FOR WHEEZING.   8.5 g   5   . valsartan (DIOVAN) 320 MG tablet      TAKE (1) TABLET BY MOUTH ONCE DAILY.   30 tablet   3   .  warfarin (COUMADIN) 5 MG tablet   Oral   Take 5-7.5 mg by mouth at bedtime. 5mg  all week Sat she takes 7.5,          There were no vitals taken for this visit. Physical Exam  Nursing note and vitals reviewed. Constitutional: She is oriented to person, place, and time. She appears well-developed and well-nourished.  HENT:  Head: Normocephalic and atraumatic.  Eyes: Conjunctivae and EOM are normal. Pupils are equal, round, and reactive to light.   Neck: Normal range of motion. Neck supple.  Cardiovascular: Normal rate, regular rhythm and normal heart sounds.   Pulmonary/Chest: Effort normal and breath sounds normal.  Abdominal: Soft. Bowel sounds are normal.  Musculoskeletal: Normal range of motion.  Neurological: She is alert and oriented to person, place, and time.  Skin: Skin is warm and dry.  Psychiatric: She has a normal mood and affect. Her behavior is normal.    ED Course  Procedures (including critical care time) DIAGNOSTIC STUDIES: Oxygen Saturation is 99% on room air, normal by my interpretation.    COORDINATION OF CARE: At 530 PM Discussed treatment plan with patient which includes breathing tx, CXR. Patient agrees.   Labs Review Labs Reviewed - No data to display Imaging Review Dg Chest 1 View  06/17/2013   CLINICAL DATA:  Shortness of breath, congestion, history diabetes, asthma, hypertension, GERD, coronary artery disease, former smoker  EXAM: CHEST - 1 VIEW  COMPARISON:  04/30/2012  FINDINGS: Enlargement of cardiac silhouette.  Atherosclerotic calcification aorta.  Mediastinal contours and pulmonary vascularity normal.  Minimal pleural thickening at the upper lateral right chest unchanged.  No definite infiltrate, pleural effusion or pneumothorax.  No acute osseous findings.  IMPRESSION: Enlargement of cardiac silhouette.  No acute abnormalities.   Electronically Signed   By: Lavonia Dana M.D.   On: 06/17/2013 19:40    EKG Interpretation   None       MDM  No diagnosis found. Patient is in no acute distress.  Chest x-ray negative. We'll start prednisone. Patient has home nebulization machine   I personally performed the services described in this documentation, which was scribed in my presence. The recorded information has been reviewed and is accurate.      Nat Christen, MD 06/17/13 [phone]

## 2013-06-21 ENCOUNTER — Other Ambulatory Visit: Payer: Self-pay | Admitting: Family Medicine

## 2013-06-21 ENCOUNTER — Ambulatory Visit (INDEPENDENT_AMBULATORY_CARE_PROVIDER_SITE_OTHER): Payer: Medicare Other | Admitting: Family Medicine

## 2013-06-21 ENCOUNTER — Encounter: Payer: Self-pay | Admitting: Family Medicine

## 2013-06-21 VITALS — BP 132/80 | Temp 98.2°F | Ht 61.0 in | Wt 230.0 lb

## 2013-06-21 DIAGNOSIS — J329 Chronic sinusitis, unspecified: Secondary | ICD-10-CM | POA: Diagnosis not present

## 2013-06-21 DIAGNOSIS — J310 Chronic rhinitis: Secondary | ICD-10-CM

## 2013-06-21 MED ORDER — PREDNISONE 20 MG PO TABS: ORAL_TABLET | ORAL | Status: DC

## 2013-06-21 MED ORDER — ALBUTEROL SULFATE (2.5 MG/3ML) 0.083% IN NEBU: 2.5000 mg | INHALATION_SOLUTION | Freq: Four times a day (QID) | RESPIRATORY_TRACT | Status: DC | PRN

## 2013-06-21 MED ORDER — LEVOFLOXACIN 500 MG PO TABS: 500.0000 mg | ORAL_TABLET | Freq: Every day | ORAL | Status: AC

## 2013-06-21 NOTE — Progress Notes (Signed)
   Subjective:    Patient ID: Latissa P Talford, female    DOB: 1940/12/17, 72 y.o.   MRN: [id number]  HPI Patient is here today for ER f/u on 12/25.  They said she was diagnosed with URI. She was prescribed Prednisone.   She said she feels better but would like an antibiotic.   Cough is productive at times. Low-grade fever. Somewhat achy. Some frontal headache. Intermittent cough and wheezing.  Review of Systems No vomiting no diarrhea no rash ROS otherwise negative    Objective:   Physical Exam  Alert no apparent distress. HEENT moderate nasal congestion frontal tenderness pharynx normal neck supple. Lungs mild wheezes no tachypnea no crackles heart rare rhythm.      Assessment & Plan:  Impression rhinosinusitis with exacerbation of reactive airways plan antibiotics prescribed. Symptomatic care discussed. Wheezing management discussed. WSL

## 2013-06-22 ENCOUNTER — Telehealth: Payer: Self-pay | Admitting: Family Medicine

## 2013-06-22 MED ORDER — GLYBURIDE 5 MG PO TABS: 5.0000 mg | ORAL_TABLET | Freq: Every day | ORAL | Status: DC

## 2013-06-22 NOTE — Telephone Encounter (Signed)
Glyburide 5 qam numb one q am as long as on pred and as long as glu exceeds 140

## 2013-06-22 NOTE — Telephone Encounter (Signed)
Patients sugar is reading 195 today and would like someone to call her back on what she should do.

## 2013-06-22 NOTE — Telephone Encounter (Signed)
Patient states that she currently does not take any meds for diabetes. In the past when her sugar was running high, she was on glyburide 5 mg daily, she states.

## 2013-06-22 NOTE — Telephone Encounter (Signed)
What is she on for diabetes, and what dose?

## 2013-06-22 NOTE — Telephone Encounter (Signed)
Medication sent in to [facility]. Notified patient as long as on pred and as long as glu exceeds 140 to continue taking med. Patient verbalized understanding.

## 2013-07-01 ENCOUNTER — Ambulatory Visit: Payer: Medicare Other

## 2013-07-06 ENCOUNTER — Ambulatory Visit (INDEPENDENT_AMBULATORY_CARE_PROVIDER_SITE_OTHER): Payer: Medicare Other

## 2013-07-06 DIAGNOSIS — Z7901 Long term (current) use of anticoagulants: Secondary | ICD-10-CM

## 2013-07-06 LAB — POCT INR: INR: 3.1

## 2013-07-06 MED ORDER — OXYCODONE HCL 5 MG PO CAPS: 5.0000 mg | ORAL_CAPSULE | Freq: Three times a day (TID) | ORAL | Status: DC | PRN

## 2013-07-06 NOTE — Patient Instructions (Signed)
Continue same treatment, recheck in 4 weeks

## 2013-07-12 ENCOUNTER — Ambulatory Visit: Payer: Medicare Other | Admitting: Family Medicine

## 2013-08-03 ENCOUNTER — Ambulatory Visit: Payer: Medicare Other

## 2013-08-05 ENCOUNTER — Ambulatory Visit (INDEPENDENT_AMBULATORY_CARE_PROVIDER_SITE_OTHER): Payer: Medicare Other | Admitting: *Deleted

## 2013-08-05 DIAGNOSIS — IMO0001 Reserved for inherently not codable concepts without codable children: Secondary | ICD-10-CM | POA: Diagnosis not present

## 2013-08-05 DIAGNOSIS — Z79899 Other long term (current) drug therapy: Secondary | ICD-10-CM | POA: Diagnosis not present

## 2013-08-05 DIAGNOSIS — Z7901 Long term (current) use of anticoagulants: Secondary | ICD-10-CM | POA: Diagnosis not present

## 2013-08-05 DIAGNOSIS — M545 Low back pain, unspecified: Secondary | ICD-10-CM | POA: Diagnosis not present

## 2013-08-05 DIAGNOSIS — M25549 Pain in joints of unspecified hand: Secondary | ICD-10-CM | POA: Diagnosis not present

## 2013-08-05 LAB — POCT INR: INR: 2.1

## 2013-08-12 ENCOUNTER — Ambulatory Visit (INDEPENDENT_AMBULATORY_CARE_PROVIDER_SITE_OTHER): Payer: Medicare Other | Admitting: Family Medicine

## 2013-08-12 ENCOUNTER — Encounter: Payer: Self-pay | Admitting: Family Medicine

## 2013-08-12 VITALS — BP 122/90 | Ht 61.0 in | Wt 238.0 lb

## 2013-08-12 DIAGNOSIS — E119 Type 2 diabetes mellitus without complications: Secondary | ICD-10-CM | POA: Diagnosis not present

## 2013-08-12 DIAGNOSIS — Z23 Encounter for immunization: Secondary | ICD-10-CM | POA: Diagnosis not present

## 2013-08-12 LAB — POCT GLYCOSYLATED HEMOGLOBIN (HGB A1C): Hemoglobin A1C: 6.3

## 2013-08-12 NOTE — Progress Notes (Signed)
   Subjective:    Patient ID: [name], female    DOB: [birth year], 73 y.o.   MRN: [id number]  HPIWants to discuss medication Arava that her specialist wants to prescribe for RA. This is an immune modulating agent. By taking this patient will be able to come off prednisone which is a good goal for her  Results for orders placed in visit on 08/05/13  POCT INR      Result Value Ref Range   INR 2.1     Glu's good , number 80 t o 108. Patient compliant with diabetic diet. Reports no high sugar levels. Results for orders placed in visit on 08/12/13  POCT GLYCOSYLATED HEMOGLOBIN (HGB A1C)      Result Value Ref Range   Hemoglobin A1C 6.3     Patient claims compliance with the Coumadin. Tries not to Miss a dose. Urine stream diminished  Up two tomes per night to utrinate, no blood no dysuria  Kid stone in the past Patient notes wheezing at times. But generally breathing is stable. Remains compliant with Flovent. No headache no chest pain no new back pain. No abdominal pain no change in bowel habits no blood in stool  Compliant with blood pressure medicine. No obvious side effects. Review of Systems     Objective:   Physical Exam  Alert no apparent distress. HEENT normal. Lungs clear. Heart regular rate and rhythm. Ankles without edema.      Assessment & Plan:  1 type 2 diabetes good control. #2 hypertension good control. #3 asthma overall stable discussed. #4 hyperlipidemia ongoing. #5 depression patient reports clinically stable. #6 chronic anticoagulation stable in this regard. #7 rheumatoid arthritis like the medication shortly. Plan diet exercise discussed. Maintain same medication. Continue monthly INRs.illness. With patient do to go on

## 2013-08-18 ENCOUNTER — Other Ambulatory Visit: Payer: Self-pay | Admitting: Family Medicine

## 2013-08-24 ENCOUNTER — Telehealth: Payer: Self-pay | Admitting: Family Medicine

## 2013-08-24 ENCOUNTER — Other Ambulatory Visit: Payer: Self-pay | Admitting: *Deleted

## 2013-08-24 MED ORDER — OXYCODONE HCL 5 MG PO CAPS: 5.0000 mg | ORAL_CAPSULE | Freq: Three times a day (TID) | ORAL | Status: DC | PRN

## 2013-08-24 NOTE — Telephone Encounter (Signed)
Seen 08/12/13

## 2013-08-24 NOTE — Telephone Encounter (Signed)
Write enough monthly's to ast til next visit

## 2013-08-24 NOTE — Telephone Encounter (Signed)
oxycodone (OXY-IR) 5 MG capsule  Pt needs a refill

## 2013-08-25 NOTE — Telephone Encounter (Signed)
Patient notified

## 2013-09-02 ENCOUNTER — Ambulatory Visit (INDEPENDENT_AMBULATORY_CARE_PROVIDER_SITE_OTHER): Payer: Medicare Other

## 2013-09-02 DIAGNOSIS — Z7901 Long term (current) use of anticoagulants: Secondary | ICD-10-CM

## 2013-09-02 LAB — POCT INR: INR: 2.4

## 2013-09-08 ENCOUNTER — Other Ambulatory Visit: Payer: Self-pay | Admitting: Cardiovascular Disease

## 2013-09-09 DIAGNOSIS — Z79899 Other long term (current) drug therapy: Secondary | ICD-10-CM | POA: Diagnosis not present

## 2013-09-09 DIAGNOSIS — M130 Polyarthritis, unspecified: Secondary | ICD-10-CM | POA: Diagnosis not present

## 2013-09-09 DIAGNOSIS — M353 Polymyalgia rheumatica: Secondary | ICD-10-CM | POA: Diagnosis not present

## 2013-09-10 ENCOUNTER — Telehealth: Payer: Self-pay | Admitting: Family Medicine

## 2013-09-10 ENCOUNTER — Other Ambulatory Visit: Payer: Self-pay | Admitting: Cardiology

## 2013-09-10 NOTE — Telephone Encounter (Signed)
Nurse to spk with pt whatever current dose she is on clarify and maintain until follow up

## 2013-09-10 NOTE — Telephone Encounter (Signed)
Letter received from [facility] to do quantity limit override for pt's PRAVASTATIN 20mg .  Call and verified with pt, she states she's only taking one tablet daily.  [place] states that the last Rx they got from [place] stated 2 daily, said it was 03/20/13, I found no record of it in the Epic chart.  At one time pt was on 40mg . Armida Sans admitted to running out of the 40mg  and giving pt double quantity of 20mg  to make sure she was getting proper dose.) Please clarify dose and directions so that I may proceed with the quantity limit override or disregard.

## 2013-09-10 NOTE — Telephone Encounter (Signed)
Patient stated she takes 40 mg one at bedtime and will stay on that till office visit.

## 2013-09-21 ENCOUNTER — Other Ambulatory Visit: Payer: Self-pay | Admitting: Family Medicine

## 2013-09-21 NOTE — Telephone Encounter (Signed)
Seen 2/19

## 2013-10-05 ENCOUNTER — Ambulatory Visit (INDEPENDENT_AMBULATORY_CARE_PROVIDER_SITE_OTHER): Payer: Medicare Other | Admitting: *Deleted

## 2013-10-05 DIAGNOSIS — Z7901 Long term (current) use of anticoagulants: Secondary | ICD-10-CM

## 2013-10-05 LAB — POCT INR: INR: 1.9

## 2013-10-12 DIAGNOSIS — Z79899 Other long term (current) drug therapy: Secondary | ICD-10-CM | POA: Diagnosis not present

## 2013-10-12 DIAGNOSIS — M130 Polyarthritis, unspecified: Secondary | ICD-10-CM | POA: Diagnosis not present

## 2013-10-14 ENCOUNTER — Ambulatory Visit (INDEPENDENT_AMBULATORY_CARE_PROVIDER_SITE_OTHER): Payer: Medicare Other | Admitting: Internal Medicine

## 2013-10-14 ENCOUNTER — Ambulatory Visit: Payer: Medicare Other | Admitting: Nurse Practitioner

## 2013-10-21 ENCOUNTER — Ambulatory Visit (INDEPENDENT_AMBULATORY_CARE_PROVIDER_SITE_OTHER): Payer: Medicare Other | Admitting: Nurse Practitioner

## 2013-10-21 ENCOUNTER — Encounter: Payer: Self-pay | Admitting: Nurse Practitioner

## 2013-10-21 VITALS — Ht 61.0 in | Wt 238.0 lb

## 2013-10-21 DIAGNOSIS — N6019 Diffuse cystic mastopathy of unspecified breast: Secondary | ICD-10-CM | POA: Diagnosis not present

## 2013-10-21 DIAGNOSIS — R32 Unspecified urinary incontinence: Secondary | ICD-10-CM | POA: Diagnosis not present

## 2013-10-21 MED ORDER — TOLTERODINE TARTRATE 2 MG PO TABS: 2.0000 mg | ORAL_TABLET | Freq: Two times a day (BID) | ORAL | Status: DC

## 2013-10-24 ENCOUNTER — Encounter: Payer: Self-pay | Admitting: Nurse Practitioner

## 2013-10-24 NOTE — Progress Notes (Signed)
Subjective:  Presents for c/o possible "knot" in the right breast. Has screening mammogram scheduled for next week. Minimal tenderness in the breasts. No family history of breast or ovarian cancer. Minimal caffeine intake. Also mentions chronic urinary incontinence. Wears some form of pad all the time due to leakage. No history of UTIs. Interested in medication to see if this will help.  Objective:   Ht 5\' 1"  (1.549 m)  Wt 238 lb (107.956 kg)  BMI 44.99 kg/m2 NAD. Alert, oriented. Multiple fine nodularity in both breasts. Scarring noted from breast reduction. Axillae: no adenopathy. No dominant masses noted in area indicated by patient. Cystic areas are rubbery and mobile. abd soft, obese nontender.  Assessment: Fibrocystic breast  Urinary incontinence  Plan:. Proceed with mammogram as planned. Recheck if any abnormal areas found. Encouraged monthly self exams.  Detrol prescribed but pharmacy called, too expensive. Switched to [employer]. Reviewed potential adverse effects. Patient understands this will probably not resolve her symptoms due to severity.  Return if symptoms worsen or fail to improve. Otherwise, routine follow up.

## 2013-10-25 ENCOUNTER — Encounter (INDEPENDENT_AMBULATORY_CARE_PROVIDER_SITE_OTHER): Payer: Self-pay | Admitting: *Deleted

## 2013-10-25 ENCOUNTER — Encounter (INDEPENDENT_AMBULATORY_CARE_PROVIDER_SITE_OTHER): Payer: Self-pay | Admitting: Internal Medicine

## 2013-10-25 ENCOUNTER — Telehealth: Payer: Self-pay | Admitting: *Deleted

## 2013-10-25 ENCOUNTER — Other Ambulatory Visit (INDEPENDENT_AMBULATORY_CARE_PROVIDER_SITE_OTHER): Payer: Self-pay | Admitting: *Deleted

## 2013-10-25 ENCOUNTER — Ambulatory Visit (INDEPENDENT_AMBULATORY_CARE_PROVIDER_SITE_OTHER): Payer: Medicare Other | Admitting: Internal Medicine

## 2013-10-25 VITALS — BP 128/62 | HR 64 | Temp 98.3°F | Ht 61.0 in | Wt 233.8 lb

## 2013-10-25 DIAGNOSIS — R1310 Dysphagia, unspecified: Secondary | ICD-10-CM

## 2013-10-25 NOTE — Progress Notes (Signed)
Subjective:     Patient ID: [name], female   DOB: [birth year], 73 y.o.   MRN: [id number]  HPI  Here today with c/o dysphagia. She says it occurs about 3 times a week.  She says foods are lodging and she will have to spit it back out. Symptoms x 1 yrs. Symptoms are progressively worse. No foods in particular.  She tells me that sometimes water will not go down. Sometimes she will choke. Appetite is good. She has lost 2 pounds since her last visit in December. Rarely has acid reflux. She watches what she eats.  She usually has a BM every 3 days if she is constipated. She also has hx of chronic diarrhea and takes Lomotil. Hx of IBS    Her last colonoscopyEGD 2010: Incidental finding of sigmoid diverticulitis but no evidence of recurrent polyps. EGD/ED: Normal EGD. Esophagus dilated with 56 French Maloney dilator.   05/04/2012 Esograhm:  IMPRESSION:  Moderate diffuse impairment of esophageal motility.  No evidence of esophageal mass, stricture, or obstruction.  Significant gastroesophageal reflux.  Small hiatal hernia.   Review of Systems Past Medical History  Diagnosis Date  . ASCVD (arteriosclerotic cardiovascular disease)     stent to mid and proximal left anterior descending in 06/2002;drug eluting stent placed in the second diagnol in 08/2003 after  A  non-st elevation myocardial infarction   . Tobacco user     stopped   . Hyperlipidemia     pulmonary embolism 2000 and 09/2008  . Hypertension   . Pulmonary embolism     2000/09/2008  . Thyroid disease     hypothyroidism  . Allergy history unknown   . GERD (gastroesophageal reflux disease)   . Diabetes mellitus     no insulin  . Asthma   . FH: colonic polyps     adenomataous  . Anticoagulated   . Diarrhea     acute  . Rectal bleeding   . Pedal edema   . Irritable bowel syndrome   . Coronary artery disease   . Paroxysmal atrial fibrillation     normal LV function; episodes occurred in 205 and 09/2007  .  Hypothyroidism   . Peripheral edema   . Allergy   . Atrial fibrillation   . Chronic anticoagulation   . Venous stasis   . Insomnia   . Chronic pain of left knee     Past Surgical History  Procedure Laterality Date  . Cleft palate repair      4 surgeries  . Abdominal hysterectomy    . Appendectomy    . Cholecystectomy    . Heel spur surgery    . Knee surgery    . Breast reduction surgery    . Colonoscopy  2011    negative  . Oophorectomy    . Breast surgery      Allergies  Allergen Reactions  . Demerol Nausea And Vomiting    Current Outpatient Prescriptions on File Prior to Visit  Medication Sig Dispense Refill  . albuterol (PROVENTIL HFA;VENTOLIN HFA) 108 (90 BASE) MCG/ACT inhaler Inhale 2 puffs into the lungs every 6 (six) hours as needed for wheezing or shortness of breath.      Marland Kitchen albuterol (PROVENTIL) (2.5 MG/3ML) 0.083% nebulizer solution Take 3 mLs (2.5 mg total) by nebulization every 6 (six) hours as needed for wheezing or shortness of breath.  75 mL  5  . Cholecalciferol (VITAMIN D-3) 5000 UNITS TABS Take by mouth.      Marland Kitchen  citalopram (CELEXA) 40 MG tablet TAKE 1 TABLET ONCE DAILY.  30 tablet  5  . dicyclomine (BENTYL) 10 MG capsule Take 10 mg by mouth 2 (two) times daily.       Marland Kitchen diltiazem (CARDIZEM CD) 300 MG 24 hr capsule TAKE 1 CAPSULE BY MOUTH ONCE A DAY.  30 capsule  6  . diphenoxylate-atropine (LOMOTIL) 2.5-0.025 MG per tablet Take 1 tablet by mouth 2 (two) times daily as needed for diarrhea or loose stools.  60 tablet  5  . fluticasone (FLOVENT HFA) 44 MCG/ACT inhaler Inhale 2 puffs into the lungs daily as needed (Shortness of Breath).       Marland Kitchen levothyroxine (SYNTHROID, LEVOTHROID) 50 MCG tablet TAKE (1) TABLET BY MOUTH ONCE DAILY.  30 tablet  5  . montelukast (SINGULAIR) 10 MG tablet Take 10 mg by mouth at bedtime.      . ondansetron (ZOFRAN-ODT) 4 MG disintegrating tablet TAKE (1) TABLET EVERY EIGHT HOURS AS NEEDED FOR NAUSEA.  28 tablet  6  . oxycodone  (OXY-IR) 5 MG capsule Take 1 capsule (5 mg total) by mouth 3 (three) times daily as needed for pain.  90 capsule  0  . potassium chloride SA (K-DUR,KLOR-CON) 20 MEQ tablet TAKE (1) TABLET TWICE DAILY.  60 tablet  3  . pravastatin (PRAVACHOL) 20 MG tablet Take 40 mg by mouth at bedtime.       . valsartan (DIOVAN) 320 MG tablet TAKE (1) TABLET BY MOUTH ONCE DAILY.  30 tablet  6  . diltiazem (TIAZAC) 300 MG 24 hr capsule Take 300 mg by mouth daily.      . [DISCONTINUED] ferrous sulfate 325 (65 FE) MG tablet Take 1 tablet (325 mg total) by mouth daily with breakfast. IRON SUPPLEMENT.      . [DISCONTINUED] ipratropium (ATROVENT) 0.02 % nebulizer solution Take 500 mcg by nebulization every 4 (four) hours as needed. For shortness of breath       No current facility-administered medications on file prior to visit.        Objective:   Physical Exam  Filed Vitals:   10/25/13 1032  BP: 128/62  Pulse: 64  Temp: 98.3 F (36.8 C)  Height: 5\' 1"  (1.549 m)  Weight: 233 lb 12.8 oz (106.051 kg)   Alert and oriented. Skin warm and dry. Oral mucosa is moist.   . Sclera anicteric, conjunctivae is pink. Thyroid not enlarged. No cervical lymphadenopathy. Lungs clear. Heart regular rate and rhythm.  Abdomen is soft. Bowel sounds are positive. No hepatomegaly. No abdominal masses felt. No tenderness.  No edema to lower extremities.       Assessment:     Solid food dysphagia. Esophageal stricture needs to be ruled out,.     Plan:    EGD/ED.  The risks and benefits such as perforation, bleeding, and infection were reviewed with the patient and is agreeable.   All questions were answered to the best of my knowledge.

## 2013-10-25 NOTE — Telephone Encounter (Signed)
Coumadin managed by Dr Lance Sell office.

## 2013-10-25 NOTE — Telephone Encounter (Signed)
PT IS HAVING ENDO AND NEEDS TO STOP COUMADIN 5 DAYS PRIOR [place] FOR Nov 11, 2013

## 2013-10-25 NOTE — Patient Instructions (Signed)
EGD/ED. The risks and benefits such as perforation, bleeding, and infection were reviewed with the patient and is agreeable. 

## 2013-10-26 DIAGNOSIS — R1310 Dysphagia, unspecified: Secondary | ICD-10-CM | POA: Insufficient documentation

## 2013-10-27 ENCOUNTER — Telehealth: Payer: Self-pay | Admitting: Family Medicine

## 2013-10-27 ENCOUNTER — Encounter (HOSPITAL_COMMUNITY): Payer: Self-pay

## 2013-10-27 NOTE — Telephone Encounter (Signed)
Patient is scheduled for endoscopy with dilation on May 21st, needs to stop coumadin 5 days prior to procedure. Is this okay?

## 2013-10-27 NOTE — Telephone Encounter (Signed)
Dr. Richardson Landry will be back on Monday to help decide if this patient needs to use Lovenox for the days she is off the medication or not. But we will let the patient as well as the endoscopy office know this by that point in time.

## 2013-11-04 ENCOUNTER — Ambulatory Visit (INDEPENDENT_AMBULATORY_CARE_PROVIDER_SITE_OTHER): Payer: Medicare Other | Admitting: *Deleted

## 2013-11-04 DIAGNOSIS — M545 Low back pain, unspecified: Secondary | ICD-10-CM | POA: Diagnosis not present

## 2013-11-04 DIAGNOSIS — M949 Disorder of cartilage, unspecified: Secondary | ICD-10-CM | POA: Diagnosis not present

## 2013-11-04 DIAGNOSIS — IMO0001 Reserved for inherently not codable concepts without codable children: Secondary | ICD-10-CM | POA: Diagnosis not present

## 2013-11-04 DIAGNOSIS — Z7901 Long term (current) use of anticoagulants: Secondary | ICD-10-CM | POA: Diagnosis not present

## 2013-11-04 DIAGNOSIS — M069 Rheumatoid arthritis, unspecified: Secondary | ICD-10-CM | POA: Diagnosis not present

## 2013-11-04 DIAGNOSIS — M899 Disorder of bone, unspecified: Secondary | ICD-10-CM | POA: Diagnosis not present

## 2013-11-04 LAB — POCT INR: INR: 3.5

## 2013-11-05 ENCOUNTER — Encounter: Payer: Self-pay | Admitting: Family Medicine

## 2013-11-05 ENCOUNTER — Ambulatory Visit (INDEPENDENT_AMBULATORY_CARE_PROVIDER_SITE_OTHER): Payer: Medicare Other | Admitting: Family Medicine

## 2013-11-05 VITALS — BP 120/70 | Ht 61.0 in | Wt 233.0 lb

## 2013-11-05 DIAGNOSIS — R7301 Impaired fasting glucose: Secondary | ICD-10-CM | POA: Diagnosis not present

## 2013-11-05 DIAGNOSIS — Z79899 Other long term (current) drug therapy: Secondary | ICD-10-CM

## 2013-11-05 DIAGNOSIS — I4891 Unspecified atrial fibrillation: Secondary | ICD-10-CM

## 2013-11-05 DIAGNOSIS — E782 Mixed hyperlipidemia: Secondary | ICD-10-CM | POA: Diagnosis not present

## 2013-11-05 DIAGNOSIS — I10 Essential (primary) hypertension: Secondary | ICD-10-CM | POA: Diagnosis not present

## 2013-11-05 DIAGNOSIS — Z7901 Long term (current) use of anticoagulants: Secondary | ICD-10-CM

## 2013-11-05 MED ORDER — ENOXAPARIN SODIUM 100 MG/ML ~~LOC~~ SOLN: SUBCUTANEOUS | Status: DC

## 2013-11-05 NOTE — Telephone Encounter (Signed)
Nurses, call pt, with hx of PE and a fib (By the way, get that dx listed on pt's long term coumadin sheet), pt will need to do lovenox bridging therapy. She'll need to stop coumadin five d before procedure (tomorrow), and use lovenox as directed, we'll call her lovenox in this afternoon after lunch so I can determine a regimen

## 2013-11-05 NOTE — Telephone Encounter (Signed)
Pt is coming in today at 10am to discuss lovenox therapy

## 2013-11-05 NOTE — Progress Notes (Signed)
   Subjective:    Patient ID: [name], female    DOB: 1940/06/28, 73 y.o.   MRN: [id number]  HPIDiscuss coumadin schedule. Having procedure next week. INR yesterday was 3.5  Patient do to have upper endoscopy next week. Was advised to stop Coumadin if at all possible 5 days prior.  Patient has history of both atrc fibrillation and pulmonary embolus.  Patient reports compliance with Coumadin.  Patient experiencing solid food dysphagia which is indication for upper endoscopy.  [phone]  Review of Systems No chest pain no back pain no abdominal pain no change in bowel habits no blood in stool    Objective:   Physical Exam  Alert no apparent distress. Vitals stable. HEENT normal. Lungs clear. Heart regular in rhythm.      Assessment & Plan:  Impression 1 lifelong anticoagulation. Patient high risk with history pulmonary emboli. Discussed at length. Plan regimen given for Lovenox along with stopping Coumadin 5 days prior. See patient instructions. INR one day prior to procedure. Resume both Lovenox and Coumadin day following. INR several days into that. WSL

## 2013-11-05 NOTE — Patient Instructions (Signed)
Tomorrow stop the coumadin  Tom morn start the twice per day lovenox  The day before the procedure come here for an INR  The day following the procedure that morning resume both coumadin at your usual dose and the lovenox  On Saturday morning check an INR at the hospital and this will be called to dr scott

## 2013-11-05 NOTE — Progress Notes (Deleted)
   Subjective:    Patient ID: [name], female    DOB: [birth year], 73 y.o.   MRN: [id number]  HPI Pt states concern about procedure with scope going down throat   Review of Systems     Objective:   Physical Exam        Assessment & Plan:

## 2013-11-05 NOTE — Progress Notes (Deleted)
   Subjective:    Patient ID: [name], female    DOB: [birth year], 73 y.o.   MRN: [id number]  HPI    Review of Systems     Objective:   Physical Exam        Assessment & Plan:

## 2013-11-09 ENCOUNTER — Telehealth: Payer: Self-pay | Admitting: Family Medicine

## 2013-11-09 NOTE — Telephone Encounter (Signed)
enoxaparin (LOVENOX) 100 MG/ML injection   Pt states she only got 10 syringes not the 20 you ordered and does not  Have enough to get her through this the extra days that the Doc asked her to do  Can we contact the Pharmacy to find out why the 20 was not dispensed   [facility]

## 2013-11-09 NOTE — Telephone Encounter (Signed)
Patient was contacted by pharmacy and notified that they had to order some more injections and she will receive the other 10 either today or tomorrow. Patient understands.

## 2013-11-10 ENCOUNTER — Ambulatory Visit (INDEPENDENT_AMBULATORY_CARE_PROVIDER_SITE_OTHER): Payer: Medicare Other

## 2013-11-10 ENCOUNTER — Ambulatory Visit: Payer: Medicare Other

## 2013-11-10 ENCOUNTER — Telehealth: Payer: Self-pay | Admitting: Family Medicine

## 2013-11-10 DIAGNOSIS — Z7901 Long term (current) use of anticoagulants: Secondary | ICD-10-CM | POA: Diagnosis not present

## 2013-11-10 LAB — POCT INR: INR: 1.5

## 2013-11-10 MED ORDER — OXYCODONE HCL 5 MG PO CAPS: 5.0000 mg | ORAL_CAPSULE | Freq: Three times a day (TID) | ORAL | Status: DC | PRN

## 2013-11-10 NOTE — Addendum Note (Signed)
Addended by: Jesusita Oka on: 11/10/2013 02:53 PM   Modules accepted: Level of Service

## 2013-11-10 NOTE — Telephone Encounter (Signed)
Patient comes in today for her INR and would like to know if she can pick up a prescription for her oxycodone.

## 2013-11-10 NOTE — Telephone Encounter (Signed)
Ok plus two monthly ref 

## 2013-11-10 NOTE — Telephone Encounter (Signed)
Scripts are ready for pickup. [name]

## 2013-11-10 NOTE — Telephone Encounter (Signed)
Last filled oxy 5mg  #90 one TID on 09/29/13 at [facility].

## 2013-11-10 NOTE — Telephone Encounter (Signed)
Last rx how long ago? How much?

## 2013-11-11 ENCOUNTER — Encounter (HOSPITAL_COMMUNITY)
Admission: RE | Disposition: A | Discharge status: Home / Self Care | Payer: Self-pay | Source: Ambulatory Visit | Attending: Internal Medicine

## 2013-11-11 ENCOUNTER — Encounter (HOSPITAL_COMMUNITY): Payer: Self-pay | Admitting: *Deleted

## 2013-11-11 ENCOUNTER — Ambulatory Visit (HOSPITAL_COMMUNITY)
Admission: RE | Admit: 2013-11-11 | Discharge: 2013-11-11 | Disposition: A | Discharge status: Home / Self Care | Payer: Medicare Other | Source: Ambulatory Visit | Attending: Internal Medicine | Admitting: Internal Medicine

## 2013-11-11 DIAGNOSIS — E039 Hypothyroidism, unspecified: Secondary | ICD-10-CM | POA: Diagnosis not present

## 2013-11-11 DIAGNOSIS — J45909 Unspecified asthma, uncomplicated: Secondary | ICD-10-CM | POA: Insufficient documentation

## 2013-11-11 DIAGNOSIS — Z87891 Personal history of nicotine dependence: Secondary | ICD-10-CM | POA: Diagnosis not present

## 2013-11-11 DIAGNOSIS — E119 Type 2 diabetes mellitus without complications: Secondary | ICD-10-CM | POA: Diagnosis not present

## 2013-11-11 DIAGNOSIS — E785 Hyperlipidemia, unspecified: Secondary | ICD-10-CM | POA: Insufficient documentation

## 2013-11-11 DIAGNOSIS — R1310 Dysphagia, unspecified: Secondary | ICD-10-CM

## 2013-11-11 DIAGNOSIS — K2960 Other gastritis without bleeding: Secondary | ICD-10-CM | POA: Diagnosis not present

## 2013-11-11 DIAGNOSIS — I10 Essential (primary) hypertension: Secondary | ICD-10-CM | POA: Diagnosis not present

## 2013-11-11 DIAGNOSIS — I252 Old myocardial infarction: Secondary | ICD-10-CM | POA: Insufficient documentation

## 2013-11-11 DIAGNOSIS — I2510 Atherosclerotic heart disease of native coronary artery without angina pectoris: Secondary | ICD-10-CM | POA: Diagnosis not present

## 2013-11-11 DIAGNOSIS — Z7901 Long term (current) use of anticoagulants: Secondary | ICD-10-CM | POA: Insufficient documentation

## 2013-11-11 DIAGNOSIS — Z86711 Personal history of pulmonary embolism: Secondary | ICD-10-CM | POA: Diagnosis not present

## 2013-11-11 DIAGNOSIS — Q393 Congenital stenosis and stricture of esophagus: Principal | ICD-10-CM

## 2013-11-11 DIAGNOSIS — Q391 Atresia of esophagus with tracheo-esophageal fistula: Secondary | ICD-10-CM | POA: Diagnosis not present

## 2013-11-11 DIAGNOSIS — Z79899 Other long term (current) drug therapy: Secondary | ICD-10-CM | POA: Diagnosis not present

## 2013-11-11 DIAGNOSIS — K259 Gastric ulcer, unspecified as acute or chronic, without hemorrhage or perforation: Secondary | ICD-10-CM | POA: Diagnosis not present

## 2013-11-11 DIAGNOSIS — K219 Gastro-esophageal reflux disease without esophagitis: Secondary | ICD-10-CM

## 2013-11-11 HISTORY — PX: SAVORY DILATION: SHX5439

## 2013-11-11 HISTORY — PX: ESOPHAGOGASTRODUODENOSCOPY: SHX5428

## 2013-11-11 HISTORY — PX: BALLOON DILATION: SHX5330

## 2013-11-11 HISTORY — PX: MALONEY DILATION: SHX5535

## 2013-11-11 LAB — GLUCOSE, CAPILLARY: GLUCOSE-CAPILLARY: 87 mg/dL (ref 70–99)

## 2013-11-11 SURGERY — EGD (ESOPHAGOGASTRODUODENOSCOPY)
Anesthesia: Moderate Sedation

## 2013-11-11 MED ORDER — SODIUM CHLORIDE 0.9 % IV SOLN
INTRAVENOUS | Status: DC
  Administered 2013-11-11: 1000 mL via INTRAVENOUS

## 2013-11-11 MED ORDER — BUTAMBEN-TETRACAINE-BENZOCAINE 2-2-14 % EX AERO
INHALATION_SPRAY | CUTANEOUS | Status: DC | PRN
  Administered 2013-11-11: 2 via TOPICAL

## 2013-11-11 MED ORDER — MIDAZOLAM HCL 5 MG/5ML IJ SOLN
INTRAMUSCULAR | Status: AC
  Filled 2013-11-11: qty 10

## 2013-11-11 MED ORDER — PANTOPRAZOLE SODIUM 40 MG PO TBEC: 40.0000 mg | DELAYED_RELEASE_TABLET | Freq: Two times a day (BID) | ORAL | Status: DC

## 2013-11-11 MED ORDER — FENTANYL CITRATE 0.05 MG/ML IJ SOLN
INTRAMUSCULAR | Status: DC | PRN
  Administered 2013-11-11 (×4): 25 ug via INTRAVENOUS

## 2013-11-11 MED ORDER — FENTANYL CITRATE 0.05 MG/ML IJ SOLN
INTRAMUSCULAR | Status: AC
  Filled 2013-11-11: qty 2

## 2013-11-11 MED ORDER — MEPERIDINE HCL 50 MG/ML IJ SOLN
INTRAMUSCULAR | Status: AC
  Filled 2013-11-11: qty 1

## 2013-11-11 MED ORDER — SIMETHICONE 40 MG/0.6ML PO SUSP
ORAL | Status: DC | PRN
  Administered 2013-11-11: 13:00:00

## 2013-11-11 MED ORDER — MIDAZOLAM HCL 5 MG/5ML IJ SOLN
INTRAMUSCULAR | Status: DC | PRN
  Administered 2013-11-11 (×2): 2 mg via INTRAVENOUS
  Administered 2013-11-11: 1 mg via INTRAVENOUS

## 2013-11-11 NOTE — H&P (Addendum)
[name] is an 73 y.o. female.   Chief Complaint: Patient is here for EGD andED. HPI: Patient is 73 year old Caucasian female who has chronic GERD and presents with dysphagia primarily to solids the solids liquids for more than 6 months. She has undergone esophageal dilation in the past for symptomatic relief last dilation was about 5 years ago at [facility] in [facility]. Heartburn is well controlled with therapy. She is having 2-3 episodes of weakness she has to regurgitate food bolus in order to get relief. She denies abdominal pain or melena. She had a barium study of November 2013 revealing significant GE reflux small hiatal hernia and suggestion of impaired motility. She is presently not taking PPI. She has history of pulmonary embolism and is chronically anticoagulated. Warfarin is on hold. INR yesterday was 1.5.  Past Medical History  Diagnosis Date  . ASCVD (arteriosclerotic cardiovascular disease)     stent to mid and proximal left anterior descending in 06/2002;drug eluting stent placed in the second diagnol in 08/2003 after  A  non-st elevation myocardial infarction   . Tobacco user     stopped   . Hyperlipidemia     pulmonary embolism 2000 and 09/2008  . Hypertension   . Pulmonary embolism     2000/09/2008  . Thyroid disease     hypothyroidism  . Allergy history unknown   . GERD (gastroesophageal reflux disease)   . Diabetes mellitus     no insulin  . Asthma   . FH: colonic polyps     adenomataous  . Anticoagulated   . Diarrhea     acute  . Rectal bleeding   . Pedal edema   . Irritable bowel syndrome   . Coronary artery disease   . Paroxysmal atrial fibrillation     normal LV function; episodes occurred in 205 and 09/2007  . Hypothyroidism   . Peripheral edema   . Allergy   . Atrial fibrillation   . Chronic anticoagulation   . Venous stasis   . Insomnia   . Chronic pain of left knee   . Diarrhea     Past Surgical History  Procedure Laterality Date  .  Cleft palate repair      4 surgeries  . Abdominal hysterectomy    . Appendectomy    . Cholecystectomy    . Heel spur surgery    . Knee surgery    . Breast reduction surgery    . Colonoscopy  2011    negative  . Oophorectomy    . Breast surgery      Family History  Problem Relation Age of Onset  . Heart attack Mother   . Diabetes Mother   . Hyperlipidemia Mother   . Heart attack Father   . Lung cancer Sister   . Lymphoma Brother   . Colon cancer Neg Hx    Social History:  reports that she has quit smoking. Her smoking use included Cigarettes. She started smoking about 26 years ago. She has a 60 pack-year smoking history. She has never used smokeless tobacco. She reports that she does not drink alcohol or use illicit drugs.  Allergies:  Allergies  Allergen Reactions  . Demerol Nausea And Vomiting    Medications Prior to Admission  Medication Sig Dispense Refill  . acetaminophen (TYLENOL) 500 MG tablet Take 500 mg by mouth daily as needed for headache.      . Cholecalciferol (VITAMIN D-3) 5000 UNITS TABS Take 1 tablet by mouth  daily.       . citalopram (CELEXA) 40 MG tablet TAKE 1 TABLET ONCE DAILY.  30 tablet  5  . dicyclomine (BENTYL) 10 MG capsule Take 10 mg by mouth 2 (two) times daily as needed (diarrhea).       . diltiazem (CARDIZEM CD) 300 MG 24 hr capsule TAKE 1 CAPSULE BY MOUTH ONCE A DAY.  30 capsule  6  . diphenoxylate-atropine (LOMOTIL) 2.5-0.025 MG per tablet Take 1 tablet by mouth 2 (two) times daily as needed for diarrhea or loose stools.  60 tablet  5  . enoxaparin (LOVENOX) 100 MG/ML injection Take 100mg  q 12 hours. Use as directed.  20 Syringe  0  . furosemide (LASIX) 40 MG tablet Take 40 mg by mouth daily.      Marland Kitchen leflunomide (ARAVA) 10 MG tablet Take 10 mg by mouth 2 (two) times daily.      Marland Kitchen levothyroxine (SYNTHROID, LEVOTHROID) 50 MCG tablet TAKE (1) TABLET BY MOUTH ONCE DAILY.  30 tablet  5  . montelukast (SINGULAIR) 10 MG tablet Take 10 mg by mouth at  bedtime.      . ondansetron (ZOFRAN-ODT) 4 MG disintegrating tablet TAKE (1) TABLET EVERY EIGHT HOURS AS NEEDED FOR NAUSEA.  28 tablet  6  . oxycodone (OXY-IR) 5 MG capsule Take 1 capsule (5 mg total) by mouth 3 (three) times daily as needed for pain.  90 capsule  0  . potassium chloride SA (K-DUR,KLOR-CON) 20 MEQ tablet TAKE (1) TABLET TWICE DAILY.  60 tablet  3  . pravastatin (PRAVACHOL) 40 MG tablet Take 40 mg by mouth at bedtime.      . valsartan (DIOVAN) 320 MG tablet TAKE (1) TABLET BY MOUTH ONCE DAILY.  30 tablet  6  . warfarin (COUMADIN) 5 MG tablet Take 2.5-5 mg by mouth See admin instructions. Takes 5 mg (1 tablet) on Sunday, Tuesday, Thursday  Takes 2.5 mg (1/2 tablet) on Monday, Wednesday, Friday and saturday      . albuterol (PROVENTIL HFA;VENTOLIN HFA) 108 (90 BASE) MCG/ACT inhaler Inhale 2 puffs into the lungs every 6 (six) hours as needed for wheezing or shortness of breath.      Marland Kitchen albuterol (PROVENTIL) (2.5 MG/3ML) 0.083% nebulizer solution Take 3 mLs (2.5 mg total) by nebulization every 6 (six) hours as needed for wheezing or shortness of breath.  75 mL  5  . fluticasone (FLOVENT HFA) 44 MCG/ACT inhaler Inhale 2 puffs into the lungs daily as needed (Shortness of Breath).         Results for orders placed in visit on 11/10/13 (from the past 48 hour(s))  POCT INR     Status: None   Collection Time    11/10/13  1:54 PM      Result Value Ref Range   INR 1.5     No results found.  ROS  Blood pressure 156/89, pulse 70, temperature 97.9 F (36.6 C), temperature source Oral, resp. rate 18, height 5\' 1"  (1.549 m), weight 233 lb (105.688 kg), SpO2 93.00%. Physical Exam  Constitutional: She appears well-developed and well-nourished.  HENT:  Mouth/Throat: Oropharynx is clear and moist.  Eyes: Conjunctivae are normal. No scleral icterus.  Neck: No thyromegaly present.  Cardiovascular: Normal rate, regular rhythm and normal heart sounds.   No murmur heard. GI:  Protuberant  abdomen; it is soft and nontender without organomegaly or masses.  Musculoskeletal: Edema: trace edema around ankles.  Lymphadenopathy:    She has no cervical adenopathy.  Neurological:  She is alert.  Skin: Skin is warm and dry.     Assessment/Plan Dysphagia to solids as well as liquids. EGD and ED.   Rogene Houston 11/11/2013, 12:51 PM

## 2013-11-11 NOTE — Op Note (Signed)
EGD PROCEDURE REPORT  PATIENT:  [name]  MR#:  [id number] Birthdate:  September 15, 1940, 73 y.o., female Endoscopist:  Dr. Rogene Houston, MD Referred By:  Dr. Grace Bushy. Wolfgang Phoenix, MD Procedure Date: 11/11/2013  Procedure:   EGD with ED.  Indications:  Patient is 73 year old Caucasian female who presents with dysphagia to solids and liquids. She has history of GERD but presently not on therapy and denies heartburn. She has undergone 2 esophageal dilations in the past most recently 5 years ago. Patient has history of pulmonary embolism. Warfarin was stopped 5 days ago in she is [place] with Lovenox dose was held this morning.            Informed Consent:  The risks, benefits, alternatives & imponderables which include, but are not limited to, bleeding, infection, perforation, drug reaction and potential missed lesion have been reviewed.  The potential for biopsy, lesion removal, esophageal dilation, etc. have also been discussed.  Questions have been answered.  All parties agreeable.  Please see history & physical in medical record for more information.  Medications:  Fentanyl 100 mcg IV Versed 5 mg IV Cetacaine spray topically for oropharyngeal anesthesia  Description of procedure:  The endoscope was introduced through the mouth and advanced to the second portion of the duodenum without difficulty or limitations. The mucosal surfaces were surveyed very carefully during advancement of the scope and upon withdrawal.  Findings:  Esophagus:  Mucosa of the proximal segment was normal. Mucosa of the distal two thirds revealed circumferential rings and linear furrows and high-grade ring at GE junction. This ring was initially dilated with a scope and subsequently with the balloon as reviewed below. GEJ:  37 cm Hiatus:  39 cm Stomach:  Stomach was empty and distended very well with insufflation. Folds in the proximal stomach are normal. Examination mucosa gastric body was normal. Scattered erosions  noted at antrum along with prepyloric ulcer about 3 x 5 mm. Although the child was patent. Angularis fundus and cardia were unremarkable. Duodenum:  Bulbar and post bulbar mucosa was normal.  Therapeutic/Diagnostic Maneuvers Performed:   Distal esophageal ring was dilated initially by passing the scope and subsequently with balloon dilator. The guidewire was pushed into gastric lumen. Room dilator was positioned across distal esophagus and insufflated to a diameter of 15 mm and then to 16.5 mm further disrupting the ring at GE junction and also resulted in mucosal disruption about 4 cm proximal to GE junction. The balloon was deflated and withdrawn. I was able to pass this scope across GE junction without any resistance. Biopsy was taken for mucosa and esophageal body and endoscope was withdrawn.  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 High-grade ring at distal esophagus initially dilated with  scope and subsequently with  balloon to 16.5 mm. Small sliding hiatal hernia. Erosive antral gastritis with 3 x 5 mm ulcer in prepyloric region. Esophageal biopsy was taken post dilation looking for eosinophilic esophagitis.  Recommendations:  Patient will resume warfarin at usual dose starting this evening and Lovenox tomorrow morning and continue for 3 days as recommended by Dr. Mickie Hillier. She should get INR checked by end of next week. H. pylori serology. Pantoprazole 40 mg by mouth twice a day. Patient advised not to take NSAIDs(she is presently taking none).  Rogene Houston  11/11/2013  1:27 PM  CC: Dr. Rubbie Battiest, MD & Dr. Rayne Du ref. provider found

## 2013-11-11 NOTE — Discharge Instructions (Signed)
Resume warfarin at usual dose starting this evening. Resume Lovenox starting tomorrow morning for 3 days. Pantoprazole 40 mg I mouth 30 minutes before breakfast and evening meal daily. Resume other medications as before. Resume usual diet. No driving for 24 hours. Physician will call with results of blood test and biopsy. Notify if you have black or tarry stools.  Gastrointestinal Endoscopy Care After Refer to this sheet in the next few weeks. These instructions provide you with information on caring for yourself after your procedure. Your caregiver may also give you more specific instructions. Your treatment has been planned according to current medical practices, but problems sometimes occur. Call your caregiver if you have any problems or questions after your procedure. HOME CARE INSTRUCTIONS  If you were given medicine to help you relax (sedative), do not drive, operate machinery, or sign important documents for 24 hours.  Avoid alcohol and hot or warm beverages for the first 24 hours after the procedure.  Only take over-the-counter or prescription medicines for pain, discomfort, or fever as directed by your caregiver. You may resume taking your normal medicines unless your caregiver tells you otherwise. Ask your caregiver when you may resume taking medicines that may cause bleeding, such as aspirin, clopidogrel, or warfarin.  You may return to your normal diet and activities on the day after your procedure, or as directed by your caregiver. Walking may help to reduce any bloated feeling in your abdomen.  Drink enough fluids to keep your urine clear or pale yellow.  You may gargle with salt water if you have a sore throat. SEEK IMMEDIATE MEDICAL CARE IF:  You have severe nausea or vomiting.  You have severe abdominal pain, abdominal cramps that last longer than 6 hours, or abdominal swelling (distention).  You have severe shoulder or back pain.  You have trouble  swallowing.  You have shortness of breath, your breathing is shallow, or you are breathing faster than normal.  You have a fever or a rapid heartbeat.  You vomit blood or material that looks like coffee grounds.  You have bloody, black, or tarry stools. MAKE SURE YOU:  Understand these instructions.  Will watch your condition.  Will get help right away if you are not doing well or get worse.

## 2013-11-12 ENCOUNTER — Encounter (HOSPITAL_COMMUNITY): Payer: Self-pay | Admitting: Internal Medicine

## 2013-11-12 LAB — H. PYLORI ANTIBODY, IGG: H Pylori IgG: <0.4 {ISR}

## 2013-11-13 ENCOUNTER — Other Ambulatory Visit: Payer: Self-pay | Admitting: Family Medicine

## 2013-11-13 DIAGNOSIS — Z7901 Long term (current) use of anticoagulants: Secondary | ICD-10-CM | POA: Diagnosis not present

## 2013-11-13 LAB — PROTIME-INR
INR: 1.1 (ref <1.50)
Prothrombin Time: 14 seconds (ref 11.6–15.2)

## 2013-11-16 ENCOUNTER — Ambulatory Visit (INDEPENDENT_AMBULATORY_CARE_PROVIDER_SITE_OTHER): Payer: Medicare Other | Admitting: *Deleted

## 2013-11-16 DIAGNOSIS — Z7901 Long term (current) use of anticoagulants: Secondary | ICD-10-CM | POA: Diagnosis not present

## 2013-11-16 LAB — POCT INR: INR: 1.4

## 2013-11-16 NOTE — Patient Instructions (Signed)
Take 1 tablet (5mg ) on Sun, Tues, Thurs, and Fri. Take 1/2 tab (2.5mg ) all other days.

## 2013-11-17 NOTE — Addendum Note (Signed)
Addended byCharolotte Capuchin D on: 11/17/2013 01:10 PM   Modules accepted: Level of Service

## 2013-12-01 ENCOUNTER — Ambulatory Visit (INDEPENDENT_AMBULATORY_CARE_PROVIDER_SITE_OTHER): Payer: Medicare Other | Admitting: *Deleted

## 2013-12-01 DIAGNOSIS — Z7901 Long term (current) use of anticoagulants: Secondary | ICD-10-CM | POA: Diagnosis not present

## 2013-12-01 LAB — POCT INR: INR: 2.7

## 2013-12-03 ENCOUNTER — Ambulatory Visit: Payer: Medicare Other

## 2013-12-10 ENCOUNTER — Ambulatory Visit: Payer: Medicare Other

## 2013-12-15 ENCOUNTER — Telehealth: Payer: Self-pay | Admitting: Family Medicine

## 2013-12-15 MED ORDER — DIPHENOXYLATE-ATROPINE 2.5-0.025 MG PO TABS: 1.0000 | ORAL_TABLET | Freq: Two times a day (BID) | ORAL | Status: DC | PRN

## 2013-12-15 NOTE — Telephone Encounter (Signed)
Ok ref times 12

## 2013-12-15 NOTE — Telephone Encounter (Signed)
diphenoxylate-atropine (LOMOTIL) 2.5-0.025 MG per tablet  Pt states she needs a refill on this med, she thought she had more Refills according to her bottle she has 3 she says but the pharmacy  Is saying she needs a renewal  [facility] delivery  She is out an needs these pills sooner rather than later please

## 2013-12-16 NOTE — Telephone Encounter (Signed)
Patient notified

## 2013-12-17 ENCOUNTER — Other Ambulatory Visit: Payer: Self-pay | Admitting: Family Medicine

## 2013-12-21 DIAGNOSIS — M545 Low back pain, unspecified: Secondary | ICD-10-CM | POA: Diagnosis not present

## 2013-12-21 DIAGNOSIS — M25569 Pain in unspecified knee: Secondary | ICD-10-CM | POA: Diagnosis not present

## 2013-12-21 DIAGNOSIS — M25549 Pain in joints of unspecified hand: Secondary | ICD-10-CM | POA: Diagnosis not present

## 2013-12-21 DIAGNOSIS — M069 Rheumatoid arthritis, unspecified: Secondary | ICD-10-CM | POA: Diagnosis not present

## 2013-12-23 ENCOUNTER — Ambulatory Visit (INDEPENDENT_AMBULATORY_CARE_PROVIDER_SITE_OTHER): Payer: Medicare Other

## 2013-12-23 DIAGNOSIS — Z7901 Long term (current) use of anticoagulants: Secondary | ICD-10-CM

## 2013-12-23 LAB — POCT INR: INR: 2.4

## 2013-12-31 ENCOUNTER — Ambulatory Visit: Payer: Medicare Other

## 2014-01-03 ENCOUNTER — Other Ambulatory Visit: Payer: Self-pay | Admitting: Family Medicine

## 2014-01-12 ENCOUNTER — Other Ambulatory Visit: Payer: Self-pay | Admitting: Family Medicine

## 2014-01-12 ENCOUNTER — Other Ambulatory Visit (HOSPITAL_COMMUNITY): Payer: Self-pay | Admitting: Internal Medicine

## 2014-01-12 DIAGNOSIS — Z1231 Encounter for screening mammogram for malignant neoplasm of breast: Secondary | ICD-10-CM

## 2014-01-28 ENCOUNTER — Ambulatory Visit: Payer: Medicare Other

## 2014-01-31 ENCOUNTER — Telehealth: Payer: Self-pay | Admitting: Family Medicine

## 2014-01-31 MED ORDER — OXYCODONE HCL 5 MG PO CAPS: 5.0000 mg | ORAL_CAPSULE | Freq: Three times a day (TID) | ORAL | Status: DC | PRN

## 2014-01-31 NOTE — Telephone Encounter (Signed)
Ok times one 

## 2014-01-31 NOTE — Telephone Encounter (Signed)
oxycodone (OXY-IR) 5 MG capsule  Pt needs refill Last filled 12/10/13  Last seen 12/23/13

## 2014-01-31 NOTE — Telephone Encounter (Signed)
Script ready for pickup. Pt notified.  

## 2014-02-04 ENCOUNTER — Ambulatory Visit (INDEPENDENT_AMBULATORY_CARE_PROVIDER_SITE_OTHER): Payer: Medicare Other | Admitting: *Deleted

## 2014-02-04 DIAGNOSIS — Z7901 Long term (current) use of anticoagulants: Secondary | ICD-10-CM | POA: Diagnosis not present

## 2014-02-04 LAB — POCT INR: INR: 2.7

## 2014-02-04 NOTE — Patient Instructions (Signed)
Continue the same. Take one tablet every day except mon, wed, and sat take one half tablet.

## 2014-02-10 DIAGNOSIS — M069 Rheumatoid arthritis, unspecified: Secondary | ICD-10-CM | POA: Diagnosis not present

## 2014-02-10 DIAGNOSIS — M7989 Other specified soft tissue disorders: Secondary | ICD-10-CM | POA: Diagnosis not present

## 2014-02-17 ENCOUNTER — Other Ambulatory Visit: Payer: Self-pay | Admitting: Family Medicine

## 2014-02-25 ENCOUNTER — Ambulatory Visit (HOSPITAL_COMMUNITY): Payer: Medicare Other

## 2014-03-08 ENCOUNTER — Ambulatory Visit: Payer: Medicare Other

## 2014-03-09 ENCOUNTER — Ambulatory Visit (INDEPENDENT_AMBULATORY_CARE_PROVIDER_SITE_OTHER): Payer: Medicare Other | Admitting: *Deleted

## 2014-03-09 DIAGNOSIS — Z7901 Long term (current) use of anticoagulants: Secondary | ICD-10-CM | POA: Diagnosis not present

## 2014-03-09 LAB — POCT INR: INR: 2.1

## 2014-03-15 ENCOUNTER — Telehealth (INDEPENDENT_AMBULATORY_CARE_PROVIDER_SITE_OTHER): Payer: Self-pay | Admitting: *Deleted

## 2014-03-15 ENCOUNTER — Encounter (INDEPENDENT_AMBULATORY_CARE_PROVIDER_SITE_OTHER): Payer: Self-pay | Admitting: Internal Medicine

## 2014-03-15 ENCOUNTER — Telehealth: Payer: Self-pay

## 2014-03-15 ENCOUNTER — Ambulatory Visit (INDEPENDENT_AMBULATORY_CARE_PROVIDER_SITE_OTHER): Payer: Medicare Other | Admitting: Internal Medicine

## 2014-03-15 ENCOUNTER — Other Ambulatory Visit (INDEPENDENT_AMBULATORY_CARE_PROVIDER_SITE_OTHER): Payer: Self-pay | Admitting: *Deleted

## 2014-03-15 VITALS — BP 128/72 | HR 78 | Temp 97.1°F | Resp 18 | Ht 61.0 in | Wt 230.5 lb

## 2014-03-15 DIAGNOSIS — Z1211 Encounter for screening for malignant neoplasm of colon: Secondary | ICD-10-CM

## 2014-03-15 DIAGNOSIS — Z86010 Personal history of colonic polyps: Secondary | ICD-10-CM

## 2014-03-15 DIAGNOSIS — R197 Diarrhea, unspecified: Secondary | ICD-10-CM

## 2014-03-15 MED ORDER — DIPHENOXYLATE-ATROPINE 2.5-0.025 MG PO TABS: 1.0000 | ORAL_TABLET | Freq: Two times a day (BID) | ORAL | Status: DC | PRN

## 2014-03-15 MED ORDER — PEG-KCL-NACL-NASULF-NA ASC-C 100 G PO SOLR: 1.0000 | Freq: Once | ORAL | Status: DC

## 2014-03-15 NOTE — Progress Notes (Addendum)
Presenting complaint;  Persistent diarrhea. History of colonic polyps.  Subjective:  Patient is 73 year old Caucasian female who presents for scheduled visit. She was last seen in May this year for dysphagia and underwent EGD with ED. She was found to have high grade Schatzki's ring. She says her swallowing is much better and she is not having heartburn often. She continues to complain of diarrhea. She is having at least 5-6 bowel movements on most days. She takes 2-3 Lomotil as a day. At times she becomes constipated. However if she does not take Lomotil she has 8-10 bowel movements per day and has no control. She believes she is eating fiber rich foods. She eats oatmeal every day. She denies abdominal pain melena or rectal bleeding. She has chronic low back pain and takes oxycodone 3-4 times a day. She has lost 3 pounds since her last visit. She is hoping to lose more. Patient's last colonoscopy was in December 2010 at [facility] in [place]. When she was found to have sigmoid diverticulosis. On prior colonoscopy she had tubular adenoma removed.   Current Medications: Outpatient Encounter Prescriptions as of 03/15/2014  Medication Sig  . acetaminophen (TYLENOL) 500 MG tablet Take 500 mg by mouth daily as needed for headache.  . albuterol (PROVENTIL HFA;VENTOLIN HFA) 108 (90 BASE) MCG/ACT inhaler Inhale 2 puffs into the lungs every 6 (six) hours as needed for wheezing or shortness of breath.  Marland Kitchen albuterol (PROVENTIL) (2.5 MG/3ML) 0.083% nebulizer solution Take 3 mLs (2.5 mg total) by nebulization every 6 (six) hours as needed for wheezing or shortness of breath.  . Cholecalciferol (VITAMIN D-3) 5000 UNITS TABS Take 1 tablet by mouth daily.   . citalopram (CELEXA) 40 MG tablet TAKE 1 TABLET ONCE DAILY.  Marland Kitchen diltiazem (CARDIZEM CD) 300 MG 24 hr capsule TAKE 1 CAPSULE BY MOUTH ONCE A DAY.  . diphenoxylate-atropine (LOMOTIL) 2.5-0.025 MG per tablet Take 1 tablet by mouth 2 (two) times daily as needed for  diarrhea or loose stools.  . fluticasone (FLOVENT HFA) 44 MCG/ACT inhaler Inhale 2 puffs into the lungs daily as needed (Shortness of Breath).   . furosemide (LASIX) 40 MG tablet Take 40 mg by mouth daily.  Marland Kitchen levothyroxine (SYNTHROID, LEVOTHROID) 50 MCG tablet TAKE (1) TABLET BY MOUTH ONCE DAILY.  . montelukast (SINGULAIR) 10 MG tablet TAKE ONE TABLET BY MOUTH AT BEDTIME.  Marland Kitchen ondansetron (ZOFRAN-ODT) 4 MG disintegrating tablet TAKE (1) TABLET EVERY EIGHT HOURS AS NEEDED FOR NAUSEA.  Marland Kitchen oxycodone (OXY-IR) 5 MG capsule Take 1 capsule (5 mg total) by mouth 3 (three) times daily as needed for pain.  . pantoprazole (PROTONIX) 40 MG tablet Take 1 tablet (40 mg total) by mouth 2 (two) times daily before a meal.  . potassium chloride SA (K-DUR,KLOR-CON) 20 MEQ tablet TAKE (1) TABLET TWICE DAILY.  . pravastatin (PRAVACHOL) 40 MG tablet TAKE ONE TABLET BY MOUTH AT BEDTIME.  . valsartan (DIOVAN) 320 MG tablet TAKE (1) TABLET BY MOUTH ONCE DAILY.  Marland Kitchen warfarin (COUMADIN) 5 MG tablet Take 2.5-5 mg by mouth See admin instructions. Takes 5 mg (1 tablet) on Sunday, Tuesday, Thursday  Takes 2.5 mg (1/2 tablet) on Monday, Wednesday, Friday and saturday  . [DISCONTINUED] dicyclomine (BENTYL) 10 MG capsule Take 10 mg by mouth 2 (two) times daily as needed (diarrhea).   . [DISCONTINUED] enoxaparin (LOVENOX) 100 MG/ML injection Take 100mg  q 12 hours. Use as directed.  . [DISCONTINUED] leflunomide (ARAVA) 10 MG tablet Take 10 mg by mouth 2 (two) times daily.  Objective: Blood pressure 128/72, pulse 78, temperature 97.1 F (36.2 C), temperature source Oral, resp. rate 18, height 5\' 1"  (1.549 m), weight 230 lb 8 oz (104.554 kg). Patient is alert and in no acute distress. Conjunctiva is pink. Sclera is nonicteric Oral mucosa is normal. She has dentures. 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 No neck masses or thyromegaly noted. Cardiac exam with regular rhythm normal S1 and S2.  Grade 2/6  systolic ejection murmur at LLSB. Lungs are clear to auscultation. Abdomen is obese. Bowel sounds are hyperactive. On palpation abdomen is soft and nontender without organomegaly or masses. No LE edema or clubbing noted.    Assessment:  #1. Chronic diarrhea. In the past she has been felt to have irritable bowel syndrome. Now she has constipation only with medication and if she did not take antidiarrheal she has constant diarrhea. Therefore we could be dealing with microscopic or collagenous colitis. #2. GERD. Symptoms well controlled with therapy.  Plan:  New prescription given for Lomotil or diphenoxylate which she can take one to 3 times a day with an average of 2 per day. Fiber supplement 3-4 g by mouth each bedtime. Listerine or Dulcolax suppository on as-needed basis. Diagnostic colonoscopy to be scheduled. She may have to be bridged with Lovenox for which we'll get Dr. Colleen Can help.

## 2014-03-15 NOTE — Patient Instructions (Addendum)
Colonoscopy with biopsy to be scheduled once anti-coagulation issue resolved. Fiber supplement 3-4 g by mouth daily at bedtime. Can use glycerin or Dulcolax suppository on an as-needed basis

## 2014-03-15 NOTE — Telephone Encounter (Signed)
Patient needs movi prep 

## 2014-03-15 NOTE — Telephone Encounter (Signed)
[name] at Dr. Olevia Perches office called and stated that patient was seen at their office today because she has been having problems with diarrhea. Dr. Laural Golden has scheduled patient to have a colonoscopy on 04/01/14. Patient has to stop her coumadin 5 days before this procedure. Dr. Laural Golden needs you to work on a regimen for this patient's coumadin therapy.

## 2014-03-16 NOTE — Telephone Encounter (Signed)
rec ov next wk to disc and bridge therapy

## 2014-03-17 ENCOUNTER — Other Ambulatory Visit: Payer: Self-pay | Admitting: Family Medicine

## 2014-03-22 ENCOUNTER — Telehealth: Payer: Self-pay | Admitting: Family Medicine

## 2014-03-22 MED ORDER — OXYCODONE HCL 5 MG PO CAPS: 5.0000 mg | ORAL_CAPSULE | Freq: Three times a day (TID) | ORAL | Status: DC | PRN

## 2014-03-22 NOTE — Telephone Encounter (Signed)
Last seen 11/05/13.

## 2014-03-22 NOTE — Telephone Encounter (Signed)
Ok times one 

## 2014-03-22 NOTE — Telephone Encounter (Signed)
oxycodone (OXY-IR) 5 MG capsule  Last seen 11/05/13 for chronic OV Last filled 01/31/14

## 2014-03-22 NOTE — Telephone Encounter (Signed)
Transferred patient to front desk to schedule appt this week. Patient states that she has since cancelled the colonoscopy.

## 2014-03-22 NOTE — Telephone Encounter (Signed)
Patient notified script ready for pick up

## 2014-03-23 ENCOUNTER — Ambulatory Visit: Payer: Medicare Other | Admitting: Family Medicine

## 2014-03-24 ENCOUNTER — Ambulatory Visit: Payer: Medicare Other | Admitting: Nurse Practitioner

## 2014-03-29 ENCOUNTER — Encounter: Payer: Self-pay | Admitting: Family Medicine

## 2014-03-29 ENCOUNTER — Ambulatory Visit (INDEPENDENT_AMBULATORY_CARE_PROVIDER_SITE_OTHER): Payer: Medicare Other | Admitting: Family Medicine

## 2014-03-29 VITALS — BP 126/84 | Ht 61.0 in | Wt 233.0 lb

## 2014-03-29 DIAGNOSIS — E782 Mixed hyperlipidemia: Secondary | ICD-10-CM | POA: Diagnosis not present

## 2014-03-29 DIAGNOSIS — M25561 Pain in right knee: Secondary | ICD-10-CM | POA: Diagnosis not present

## 2014-03-29 DIAGNOSIS — Z23 Encounter for immunization: Secondary | ICD-10-CM | POA: Diagnosis not present

## 2014-03-29 DIAGNOSIS — Z79899 Other long term (current) drug therapy: Secondary | ICD-10-CM

## 2014-03-29 DIAGNOSIS — G8929 Other chronic pain: Secondary | ICD-10-CM | POA: Diagnosis not present

## 2014-03-29 MED ORDER — OXYCODONE HCL 5 MG PO CAPS: 5.0000 mg | ORAL_CAPSULE | Freq: Four times a day (QID) | ORAL | Status: DC | PRN

## 2014-03-29 NOTE — Progress Notes (Signed)
   Subjective:    Patient ID: [name], female    DOB: Sep 15, 1940, 73 y.o.   MRN: [id number]  Knee Pain  The incident occurred more than 1 week ago. The pain is present in the right knee. The quality of the pain is described as aching, stabbing and shooting. The pain is at a severity of 10/10. The pain is severe. The pain has been constant since onset. Nothing aggravates the symptoms.   Very painful day and night  Pt's ortho retired in may, wants to see an ortho in [place]  Patient also notes she is to a GI procedure later in the year. At that time she will need to be on Bruce therapy. This is discussed.  Claims compliance with Coumadin.  Also compliant with blood pressure medication.   Review of Systems No headache no chest pain no abdominal pain no change about habits no blood in stool    Objective:   Physical Exam  Alert moderate malaise frontal stable. Lungs clear. Heart regular in rhythm. Right knee swollen. Positive effusion. Positive joint line tenderness. Mild crepitations.   Procedure note patient was prepped draped anesthetized. Sterilized. And injected with 1 cc steroid and 2 cc of Xylocaine. She reports significant improvement in pain.     Assessment & Plan:  Impression 1 hypertension good control #2 chronic anticoagulation discussed. #3 flare of knee pain with injection today. Plan or so referral. Local measures discussed. We'll do bridge therapy at proper time. Discussed. WSL

## 2014-03-30 ENCOUNTER — Telehealth: Payer: Self-pay | Admitting: Family Medicine

## 2014-03-30 NOTE — Telephone Encounter (Signed)
We can change that to tabs, will need to bring back or destroy others

## 2014-03-30 NOTE — Telephone Encounter (Signed)
Patient says that her oxycodone Rx was wrote for capsules and her pharmacy won't have capsules for another 48 hours. She wants to know if she can be wrote for tablets.

## 2014-03-30 NOTE — Telephone Encounter (Signed)
Patient to bring back other rxs.

## 2014-03-30 NOTE — Telephone Encounter (Signed)
New rx given to patient and old rx was shredded.

## 2014-04-08 ENCOUNTER — Ambulatory Visit (INDEPENDENT_AMBULATORY_CARE_PROVIDER_SITE_OTHER): Payer: Medicare Other | Admitting: *Deleted

## 2014-04-08 ENCOUNTER — Other Ambulatory Visit: Payer: Self-pay | Admitting: *Deleted

## 2014-04-08 ENCOUNTER — Telehealth: Payer: Self-pay | Admitting: *Deleted

## 2014-04-08 DIAGNOSIS — E782 Mixed hyperlipidemia: Secondary | ICD-10-CM | POA: Diagnosis not present

## 2014-04-08 DIAGNOSIS — Z79899 Other long term (current) drug therapy: Secondary | ICD-10-CM | POA: Diagnosis not present

## 2014-04-08 DIAGNOSIS — Z7901 Long term (current) use of anticoagulants: Secondary | ICD-10-CM | POA: Diagnosis not present

## 2014-04-08 LAB — POCT INR: INR: 1.5

## 2014-04-08 NOTE — Telephone Encounter (Signed)
[facility] is on Nov 18th. You wanted to bridge her Coumadin.

## 2014-04-08 NOTE — Patient Instructions (Signed)
*  This Saturday (10/17) take one whole tablet.  Resume same treatment: take 1 tablet (5mg ) on Sun, Tues, Thurs, and Fri. Take 1/2 tab (2.5mg ) all other days. Recheck in 4 weeks.

## 2014-04-09 LAB — HEPATIC FUNCTION PANEL
ALK PHOS: 49 U/L (ref 39–117)
ALT: 11 U/L (ref 0–35)
AST: 15 U/L (ref 0–37)
Albumin: 3.8 g/dL (ref 3.5–5.2)
BILIRUBIN DIRECT: 0.1 mg/dL (ref 0.0–0.3)
Indirect Bilirubin: 0.4 mg/dL (ref 0.2–1.2)
TOTAL PROTEIN: 6.2 g/dL (ref 6.0–8.3)
Total Bilirubin: 0.5 mg/dL (ref 0.2–1.2)

## 2014-04-09 LAB — LIPID PANEL
Cholesterol: 198 mg/dL (ref 0–200)
HDL: 66 mg/dL (ref >39)
LDL Cholesterol: 107 mg/dL — ABNORMAL HIGH (ref 0–99)
TRIGLYCERIDES: 123 mg/dL (ref <150)
Total CHOL/HDL Ratio: 3 Ratio
VLDL: 25 mg/dL (ref 0–40)

## 2014-04-09 LAB — BASIC METABOLIC PANEL
BUN: 14 mg/dL (ref 6–23)
CO2: 29 mEq/L (ref 19–32)
CREATININE: 0.84 mg/dL (ref 0.50–1.10)
Calcium: 9.5 mg/dL (ref 8.4–10.5)
Chloride: 101 mEq/L (ref 96–112)
GLUCOSE: 94 mg/dL (ref 70–99)
POTASSIUM: 4.4 meq/L (ref 3.5–5.3)
Sodium: 140 mEq/L (ref 135–145)

## 2014-04-16 ENCOUNTER — Other Ambulatory Visit: Payer: Self-pay | Admitting: Adult Health

## 2014-04-16 ENCOUNTER — Other Ambulatory Visit: Payer: Self-pay | Admitting: Family Medicine

## 2014-04-27 ENCOUNTER — Ambulatory Visit (INDEPENDENT_AMBULATORY_CARE_PROVIDER_SITE_OTHER): Payer: Medicare Other | Admitting: Family Medicine

## 2014-04-27 ENCOUNTER — Encounter: Payer: Self-pay | Admitting: Family Medicine

## 2014-04-27 VITALS — BP 138/90 | Ht 61.0 in | Wt 232.0 lb

## 2014-04-27 DIAGNOSIS — I4819 Other persistent atrial fibrillation: Secondary | ICD-10-CM

## 2014-04-27 DIAGNOSIS — Z7901 Long term (current) use of anticoagulants: Secondary | ICD-10-CM | POA: Diagnosis not present

## 2014-04-27 DIAGNOSIS — I10 Essential (primary) hypertension: Secondary | ICD-10-CM | POA: Diagnosis not present

## 2014-04-27 DIAGNOSIS — E038 Other specified hypothyroidism: Secondary | ICD-10-CM | POA: Diagnosis not present

## 2014-04-27 DIAGNOSIS — I481 Persistent atrial fibrillation: Secondary | ICD-10-CM | POA: Diagnosis not present

## 2014-04-27 MED ORDER — PREGABALIN 75 MG PO CAPS: 75.0000 mg | ORAL_CAPSULE | Freq: Two times a day (BID) | ORAL | Status: DC

## 2014-04-27 MED ORDER — ENOXAPARIN SODIUM 100 MG/ML ~~LOC~~ SOLN: SUBCUTANEOUS | Status: DC

## 2014-04-27 NOTE — Progress Notes (Signed)
   Subjective:    Patient ID: [name], female    DOB: [birth year], 73 y.o.   MRN: [id number]  HPI Patient is here today d/t fibromyalgia pain.   She said as long as she is sitting down in a chair, she does not have pain.   She has been taking the oxycodone 4 times a day.  Pt says the prednisone worked for her.had been on it for protracted period of time with questionable polymyalgia rheumatica diagnosis. Now followed by rheumatologist who feels patient does not have this condition.  She will be getting a colonoscopy on Nov 18.   Off pred for about two wks  Breathing acting up soneclaims compliance with her asthma medication.  athritis originally thought to be rheum arthritis, now thingking more osteoarthritis. Compliant with her chronic pain medicine. States she definitely needs it.  Due for another colonoscopy in a couple weeks. On Coumadin for DVT prevention and will need bridge therapy.  Review of Systems No headache no chest pain ongoing joint and muscle pain ongoing fatigue occasional wheezing    Objective:   Physical Exam  Alert HEENT normal. Lungs trace wheeze heart regular rhythm a calls trace edema. Some difficulty walking.      Assessment & Plan:  Impression 1 fibromyalgia #2 obesity #3 asthma fair control #4 colonoscopy due soon #5 atrial fibrillation withchronic prophylaxis#6 prednisone use patient would really like to stay on. We would really like to not start her. Very lengthy discussion this regard. plan pain medication written. Diet exercise discussed. Bridge therapy prescribed and discussed. Encouraged to hold off on prednisone. Follow-up in 3 months will focus more than on patient's diabetes and other chronic illnesses. 35-40 minutes spent most in discussion of many challengesWSL

## 2014-04-27 NOTE — Patient Instructions (Signed)
Five days before the procedure stop the coumadin and strt the twice per day lovenox  The morning 24 hours before your procedure take the last dose of lovenox.  You need an INR either here or with your specialist before the procedure to make sure back down  The day following your procedure resume coumadin at the usual dose and resume the lovenox injections  Get your INR checked two or three d following the procedure

## 2014-05-05 ENCOUNTER — Telehealth: Payer: Self-pay | Admitting: *Deleted

## 2014-05-05 ENCOUNTER — Ambulatory Visit: Payer: Medicare Other

## 2014-05-05 NOTE — Telephone Encounter (Signed)
[name] said ever since she started taking the Lyrica, (11/4) she has felt weak and her face is pale. The last dose she took was on Tuesday. Is this a side effect? Can she be switched to something else?

## 2014-05-05 NOTE — Telephone Encounter (Signed)
Patient notified and verbalized understanding. 

## 2014-05-05 NOTE — Telephone Encounter (Signed)
Pt had requested lyrica to see if it would help her neuropathic and fibromyalgia pain, change to just one at bedtime, if still having difficulties, then just stop

## 2014-05-08 ENCOUNTER — Other Ambulatory Visit: Payer: Self-pay

## 2014-05-08 ENCOUNTER — Encounter (HOSPITAL_COMMUNITY): Payer: Self-pay

## 2014-05-08 ENCOUNTER — Observation Stay (HOSPITAL_COMMUNITY)
Admission: EM | Admit: 2014-05-08 | Discharge: 2014-05-11 | Disposition: A | Discharge status: Home / Self Care | Payer: Medicare Other | Attending: Internal Medicine | Admitting: Internal Medicine

## 2014-05-08 DIAGNOSIS — Z87891 Personal history of nicotine dependence: Secondary | ICD-10-CM | POA: Insufficient documentation

## 2014-05-08 DIAGNOSIS — E039 Hypothyroidism, unspecified: Secondary | ICD-10-CM | POA: Insufficient documentation

## 2014-05-08 DIAGNOSIS — D125 Benign neoplasm of sigmoid colon: Secondary | ICD-10-CM | POA: Insufficient documentation

## 2014-05-08 DIAGNOSIS — Z7901 Long term (current) use of anticoagulants: Secondary | ICD-10-CM

## 2014-05-08 DIAGNOSIS — R110 Nausea: Secondary | ICD-10-CM | POA: Diagnosis present

## 2014-05-08 DIAGNOSIS — J45909 Unspecified asthma, uncomplicated: Secondary | ICD-10-CM | POA: Diagnosis not present

## 2014-05-08 DIAGNOSIS — G8929 Other chronic pain: Secondary | ICD-10-CM | POA: Diagnosis not present

## 2014-05-08 DIAGNOSIS — K259 Gastric ulcer, unspecified as acute or chronic, without hemorrhage or perforation: Secondary | ICD-10-CM | POA: Insufficient documentation

## 2014-05-08 DIAGNOSIS — Z79899 Other long term (current) drug therapy: Secondary | ICD-10-CM | POA: Insufficient documentation

## 2014-05-08 DIAGNOSIS — K922 Gastrointestinal hemorrhage, unspecified: Secondary | ICD-10-CM | POA: Diagnosis not present

## 2014-05-08 DIAGNOSIS — I10 Essential (primary) hypertension: Secondary | ICD-10-CM | POA: Diagnosis not present

## 2014-05-08 DIAGNOSIS — K5730 Diverticulosis of large intestine without perforation or abscess without bleeding: Secondary | ICD-10-CM | POA: Insufficient documentation

## 2014-05-08 DIAGNOSIS — K921 Melena: Secondary | ICD-10-CM | POA: Diagnosis not present

## 2014-05-08 DIAGNOSIS — Z885 Allergy status to narcotic agent status: Secondary | ICD-10-CM | POA: Diagnosis not present

## 2014-05-08 DIAGNOSIS — D509 Iron deficiency anemia, unspecified: Secondary | ICD-10-CM | POA: Diagnosis not present

## 2014-05-08 DIAGNOSIS — Z86711 Personal history of pulmonary embolism: Secondary | ICD-10-CM | POA: Insufficient documentation

## 2014-05-08 DIAGNOSIS — Z86010 Personal history of colonic polyps: Secondary | ICD-10-CM

## 2014-05-08 DIAGNOSIS — M25562 Pain in left knee: Secondary | ICD-10-CM | POA: Insufficient documentation

## 2014-05-08 DIAGNOSIS — D649 Anemia, unspecified: Secondary | ICD-10-CM | POA: Diagnosis not present

## 2014-05-08 DIAGNOSIS — I2510 Atherosclerotic heart disease of native coronary artery without angina pectoris: Secondary | ICD-10-CM | POA: Diagnosis not present

## 2014-05-08 DIAGNOSIS — K219 Gastro-esophageal reflux disease without esophagitis: Secondary | ICD-10-CM | POA: Insufficient documentation

## 2014-05-08 DIAGNOSIS — I878 Other specified disorders of veins: Secondary | ICD-10-CM | POA: Diagnosis not present

## 2014-05-08 DIAGNOSIS — I4891 Unspecified atrial fibrillation: Secondary | ICD-10-CM | POA: Diagnosis not present

## 2014-05-08 DIAGNOSIS — M353 Polymyalgia rheumatica: Secondary | ICD-10-CM | POA: Diagnosis present

## 2014-05-08 DIAGNOSIS — E119 Type 2 diabetes mellitus without complications: Secondary | ICD-10-CM | POA: Insufficient documentation

## 2014-05-08 DIAGNOSIS — K644 Residual hemorrhoidal skin tags: Secondary | ICD-10-CM | POA: Diagnosis not present

## 2014-05-08 DIAGNOSIS — K2970 Gastritis, unspecified, without bleeding: Secondary | ICD-10-CM | POA: Diagnosis not present

## 2014-05-08 DIAGNOSIS — K580 Irritable bowel syndrome with diarrhea: Secondary | ICD-10-CM | POA: Insufficient documentation

## 2014-05-08 DIAGNOSIS — E785 Hyperlipidemia, unspecified: Secondary | ICD-10-CM | POA: Diagnosis not present

## 2014-05-08 DIAGNOSIS — R197 Diarrhea, unspecified: Secondary | ICD-10-CM

## 2014-05-08 LAB — URINALYSIS, ROUTINE W REFLEX MICROSCOPIC
Glucose, UA: NEGATIVE mg/dL
Ketones, ur: 40 mg/dL — AB
Leukocytes, UA: NEGATIVE
Nitrite: NEGATIVE
Protein, ur: NEGATIVE mg/dL
Specific Gravity, Urine: 1.02 (ref 1.005–1.030)
Urobilinogen, UA: 0.2 mg/dL (ref 0.0–1.0)
pH: 6 (ref 5.0–8.0)

## 2014-05-08 LAB — BASIC METABOLIC PANEL
Anion gap: 12 (ref 5–15)
BUN: 10 mg/dL (ref 6–23)
CO2: 28 mEq/L (ref 19–32)
Calcium: 9.4 mg/dL (ref 8.4–10.5)
Chloride: 103 mEq/L (ref 96–112)
Creatinine, Ser: 0.81 mg/dL (ref 0.50–1.10)
GFR calc Af Amer: 80 mL/min — ABNORMAL LOW (ref >90)
GFR calc non Af Amer: 69 mL/min — ABNORMAL LOW (ref >90)
Glucose, Bld: 86 mg/dL (ref 70–99)
Potassium: 3.9 mEq/L (ref 3.7–5.3)
Sodium: 143 mEq/L (ref 137–147)

## 2014-05-08 LAB — CBC WITH DIFFERENTIAL/PLATELET
Basophils Absolute: 0.1 10*3/uL (ref 0.0–0.1)
Basophils Relative: 1 % (ref 0–1)
Eosinophils Absolute: 0.1 10*3/uL (ref 0.0–0.7)
Eosinophils Relative: 2 % (ref 0–5)
HCT: 35.4 % — ABNORMAL LOW (ref 36.0–46.0)
Hemoglobin: 11.3 g/dL — ABNORMAL LOW (ref 12.0–15.0)
Lymphocytes Relative: 21 % (ref 12–46)
Lymphs Abs: 1.5 10*3/uL (ref 0.7–4.0)
MCH: 27.2 pg (ref 26.0–34.0)
MCHC: 31.9 g/dL (ref 30.0–36.0)
MCV: 85.1 fL (ref 78.0–100.0)
Monocytes Absolute: 0.7 10*3/uL (ref 0.1–1.0)
Monocytes Relative: 10 % (ref 3–12)
Neutro Abs: 4.9 10*3/uL (ref 1.7–7.7)
Neutrophils Relative %: 66 % (ref 43–77)
Platelets: 265 10*3/uL (ref 150–400)
RBC: 4.16 MIL/uL (ref 3.87–5.11)
RDW: 15 % (ref 11.5–15.5)
WBC: 7.3 10*3/uL (ref 4.0–10.5)

## 2014-05-08 LAB — HEPATIC FUNCTION PANEL
ALT: 7 U/L (ref 0–35)
AST: 15 U/L (ref 0–37)
Albumin: 3.4 g/dL — ABNORMAL LOW (ref 3.5–5.2)
Alkaline Phosphatase: 53 U/L (ref 39–117)
Bilirubin, Direct: <0.2 mg/dL (ref 0.0–0.3)
Total Bilirubin: 0.2 mg/dL — ABNORMAL LOW (ref 0.3–1.2)
Total Protein: 6.7 g/dL (ref 6.0–8.3)

## 2014-05-08 LAB — TYPE AND SCREEN
ABO/RH(D): A POS
Antibody Screen: NEGATIVE

## 2014-05-08 LAB — LIPASE, BLOOD: Lipase: 27 U/L (ref 11–59)

## 2014-05-08 LAB — URINE MICROSCOPIC-ADD ON

## 2014-05-08 LAB — PROTIME-INR
INR: 1.97 — ABNORMAL HIGH (ref 0.00–1.49)
Prothrombin Time: 22.6 seconds — ABNORMAL HIGH (ref 11.6–15.2)

## 2014-05-08 MED ORDER — PANTOPRAZOLE SODIUM 40 MG PO TBEC
40.0000 mg | DELAYED_RELEASE_TABLET | Freq: Two times a day (BID) | ORAL | Status: DC
  Administered 2014-05-09 – 2014-05-11 (×5): 40 mg via ORAL
  Filled 2014-05-08 (×5): qty 1

## 2014-05-08 MED ORDER — OXYCODONE HCL 5 MG PO TABS: 5.0000 mg | ORAL_TABLET | Freq: Four times a day (QID) | ORAL | Status: DC | PRN

## 2014-05-08 MED ORDER — SODIUM CHLORIDE 0.9 % IJ SOLN
3.0000 mL | Freq: Two times a day (BID) | INTRAMUSCULAR | Status: DC
  Administered 2014-05-09 – 2014-05-10 (×3): 3 mL via INTRAVENOUS

## 2014-05-08 MED ORDER — LEVOTHYROXINE SODIUM 50 MCG PO TABS
50.0000 ug | ORAL_TABLET | Freq: Every day | ORAL | Status: DC
  Administered 2014-05-09 – 2014-05-11 (×3): 50 ug via ORAL
  Filled 2014-05-08 (×3): qty 1

## 2014-05-08 MED ORDER — ONDANSETRON HCL 4 MG PO TABS: 4.0000 mg | ORAL_TABLET | Freq: Four times a day (QID) | ORAL | Status: DC | PRN

## 2014-05-08 MED ORDER — SODIUM CHLORIDE 0.9 % IV SOLN: 250.0000 mL | INTRAVENOUS | Status: DC | PRN

## 2014-05-08 MED ORDER — SODIUM CHLORIDE 0.9 % IJ SOLN: 3.0000 mL | INTRAMUSCULAR | Status: DC | PRN

## 2014-05-08 MED ORDER — ONDANSETRON HCL 4 MG/2ML IJ SOLN
4.0000 mg | Freq: Four times a day (QID) | INTRAMUSCULAR | Status: DC | PRN
  Administered 2014-05-09 – 2014-05-10 (×3): 4 mg via INTRAVENOUS
  Filled 2014-05-08 (×4): qty 2

## 2014-05-08 MED ORDER — DILTIAZEM HCL ER COATED BEADS 180 MG PO CP24
300.0000 mg | ORAL_CAPSULE | Freq: Every day | ORAL | Status: DC
  Administered 2014-05-09 – 2014-05-11 (×3): 300 mg via ORAL
  Filled 2014-05-08 (×6): qty 1

## 2014-05-08 MED ORDER — SODIUM CHLORIDE 0.9 % IV SOLN
INTRAVENOUS | Status: AC
  Administered 2014-05-09: via INTRAVENOUS

## 2014-05-08 NOTE — ED Notes (Signed)
Pt c/o generalized abd pain with nausea and diarrhea.  Reports dark tarry stool x 2 hours.  Is presently off of coumadin but on lovenox injections because is scheduled for colonoscopy Wednesday.

## 2014-05-08 NOTE — H&P (Signed)
PCP:   Rubbie Battiest, MD   Chief Complaint:  Black stool  HPI: 73 yo female with black stool for one day and mild epigastric pain with nausea for 3 days no vomiting.  No fevers.  Is currently on lovenox off coumadin for a colonscopy on Wednesday.  H/o PE x 2.  No swellling.  No melena since arrival to ED.  Heme positive.  Review of Systems:  Positive and negative as per HPI otherwise all other systems are negative  Past Medical History: Past Medical History  Diagnosis Date  . ASCVD (arteriosclerotic cardiovascular disease)     stent to mid and proximal left anterior descending in 06/2002;drug eluting stent placed in the second diagnol in 08/2003 after  A  non-st elevation myocardial infarction   . Tobacco user     stopped   . Hyperlipidemia     pulmonary embolism 2000 and 09/2008  . Hypertension   . Pulmonary embolism     2000/09/2008  . Thyroid disease     hypothyroidism  . Allergy history unknown   . GERD (gastroesophageal reflux disease)   . Diabetes mellitus     no insulin  . Asthma   . FH: colonic polyps     adenomataous  . Anticoagulated   . Diarrhea     acute  . Rectal bleeding   . Pedal edema   . Irritable bowel syndrome   . Coronary artery disease   . Paroxysmal atrial fibrillation     normal LV function; episodes occurred in 205 and 09/2007  . Hypothyroidism   . Peripheral edema   . Allergy   . Atrial fibrillation   . Chronic anticoagulation   . Venous stasis   . Insomnia   . Chronic pain of left knee   . Diarrhea    Past Surgical History  Procedure Laterality Date  . Cleft palate repair      4 surgeries  . Abdominal hysterectomy    . Appendectomy    . Cholecystectomy    . Heel spur surgery    . Knee surgery    . Breast reduction surgery    . Colonoscopy  2011    negative  . Oophorectomy    . Breast surgery    . Esophagogastroduodenoscopy N/A 11/11/2013    Procedure: ESOPHAGOGASTRODUODENOSCOPY (EGD);  Surgeon: Rogene Houston, MD;  [facility]: [facility];  Service: Endoscopy;  Laterality: N/A;  200-moved to 100 Ann notified pt  . Balloon dilation N/A 11/11/2013    Procedure: BALLOON DILATION;  Surgeon: Rogene Houston, MD;  Location: [facility];  Service: Endoscopy;  Laterality: N/A;  Venia Minks dilation N/A 11/11/2013    Procedure: Venia Minks DILATION;  Surgeon: Rogene Houston, MD;  Location: [facility];  Service: Endoscopy;  Laterality: N/A;  . Savory dilation N/A 11/11/2013    Procedure: SAVORY DILATION;  Surgeon: Rogene Houston, MD;  Location: [facility];  Service: Endoscopy;  Laterality: N/A;    Medications: Prior to Admission medications   Medication Sig Start Date End Date Taking? Authorizing Provider  acetaminophen (TYLENOL) 500 MG tablet Take 500 mg by mouth daily as needed for headache.   Yes Historical Provider, MD  albuterol (PROVENTIL HFA;VENTOLIN HFA) 108 (90 BASE) MCG/ACT inhaler Inhale 2 puffs into the lungs every 6 (six) hours as needed for wheezing or shortness of breath.   Yes Historical Provider, MD  albuterol (PROVENTIL) (2.5 MG/3ML) 0.083% nebulizer solution Take 3 mLs (2.5 mg total) by  nebulization every 6 (six) hours as needed for wheezing or shortness of breath. 06/21/13  Yes Mikey Kirschner, MD  Cholecalciferol (VITAMIN D-3) 5000 UNITS TABS Take 2 tablets by mouth daily.    Yes Historical Provider, MD  citalopram (CELEXA) 40 MG tablet Take 40 mg by mouth daily.   Yes Historical Provider, MD  diltiazem (CARDIZEM CD) 300 MG 24 hr capsule Take 300 mg by mouth daily.   Yes Historical Provider, MD  diphenoxylate-atropine (LOMOTIL) 2.5-0.025 MG per tablet Take 1 tablet by mouth 2 (two) times daily as needed for diarrhea or loose stools. 03/15/14  Yes Rogene Houston, MD  enoxaparin (LOVENOX) 100 MG/ML injection Use as directed Patient taking differently: Inject 100 mg into the skin daily. Use as directed 04/27/14  Yes Mikey Kirschner, MD  fluticasone (FLOVENT HFA) 44 MCG/ACT inhaler Inhale 2 puffs into the  lungs daily as needed (Shortness of Breath).    Yes Historical Provider, MD  levothyroxine (SYNTHROID, LEVOTHROID) 50 MCG tablet Take 50 mcg by mouth daily before breakfast.   Yes Historical Provider, MD  montelukast (SINGULAIR) 10 MG tablet Take 10 mg by mouth at bedtime.   Yes Historical Provider, MD  ondansetron (ZOFRAN-ODT) 4 MG disintegrating tablet Take 4 mg by mouth every 8 (eight) hours as needed for nausea or vomiting.   Yes Historical Provider, MD  oxycodone (OXY-IR) 5 MG capsule Take 1 capsule (5 mg total) by mouth 4 (four) times daily as needed for pain. 03/29/14  Yes Mikey Kirschner, MD  pantoprazole (PROTONIX) 40 MG tablet Take 1 tablet (40 mg total) by mouth 2 (two) times daily before a meal. Patient taking differently: Take 40 mg by mouth 2 (two) times daily.  11/11/13  Yes Rogene Houston, MD  pravastatin (PRAVACHOL) 40 MG tablet Take 40 mg by mouth at bedtime.   Yes Historical Provider, MD  valsartan (DIOVAN) 320 MG tablet Take 320 mg by mouth daily.   Yes Historical Provider, MD  citalopram (CELEXA) 40 MG tablet TAKE 1 TABLET ONCE DAILY. Patient not taking: Reported on 05/08/2014 03/17/14   Mikey Kirschner, MD  diltiazem (CARDIZEM CD) 300 MG 24 hr capsule TAKE 1 CAPSULE BY MOUTH ONCE A DAY. Patient not taking: Reported on 05/08/2014 09/08/13   Lendon Colonel, NP  furosemide (LASIX) 40 MG tablet Take 40 mg by mouth daily as needed for fluid.     Historical Provider, MD  levothyroxine (SYNTHROID, LEVOTHROID) 50 MCG tablet TAKE (1) TABLET BY MOUTH ONCE DAILY. Patient not taking: Reported on 05/08/2014 04/18/14   Mikey Kirschner, MD  montelukast (SINGULAIR) 10 MG tablet TAKE ONE TABLET BY MOUTH AT BEDTIME. Patient not taking: Reported on 05/08/2014 01/03/14   Kathyrn Drown, MD  ondansetron (ZOFRAN-ODT) 4 MG disintegrating tablet TAKE (1) TABLET EVERY EIGHT HOURS AS NEEDED FOR NAUSEA. Patient not taking: Reported on 05/08/2014 09/21/13   Kathyrn Drown, MD  peg 3350 powder  (MOVIPREP) 100 G SOLR Take 1 kit (200 g total) by mouth once. 03/15/14   Butch Penny, NP  potassium chloride SA (K-DUR,KLOR-CON) 20 MEQ tablet Take 20 mEq by mouth daily as needed. Only takes when taking furosemide.    Historical Provider, MD  pravastatin (PRAVACHOL) 40 MG tablet TAKE ONE TABLET BY MOUTH AT BEDTIME. Patient not taking: Reported on 05/08/2014 02/17/14   Mikey Kirschner, MD  pregabalin (LYRICA) 75 MG capsule Take 1 capsule (75 mg total) by mouth 2 (two) times daily. Patient not taking: Reported  on 05/08/2014 04/27/14   Mikey Kirschner, MD  valsartan (DIOVAN) 320 MG tablet TAKE (1) TABLET BY MOUTH ONCE DAILY. Patient not taking: Reported on 05/08/2014 04/18/14   Lendon Colonel, NP  warfarin (COUMADIN) 5 MG tablet Take 2.5-5 mg by mouth See admin instructions. Takes 5 mg (1 tablet) on Sunday, Tuesday, Thursday, and Friday Takes 2.5 mg (1/2 tablet) on Monday, Wednesday, and saturday    Historical Provider, MD    Allergies:   Allergies  Allergen Reactions  . Demerol Nausea And Vomiting    Social History:  reports that she has quit smoking. Her smoking use included Cigarettes. She started smoking about 27 years ago. She has a 60 pack-year smoking history. She has never used smokeless tobacco. She reports that she does not drink alcohol or use illicit drugs.  Family History: Family History  Problem Relation Age of Onset  . Heart attack Mother   . Diabetes Mother   . Hyperlipidemia Mother   . Heart attack Father   . Lung cancer Sister   . Lymphoma Brother   . Colon cancer Neg Hx     Physical Exam: Filed Vitals:   05/08/14 1900 05/08/14 1915 05/08/14 2234 05/08/14 2336  BP:   140/58 149/66  Pulse:   62 67  Temp:    98.9 F (37.2 C)  TempSrc:    Oral  Resp: _0 Height:    _1  (1.549 m)  Weight:    102.876 kg (226 lb 12.8 oz)  SpO2:   94% 91%   General appearance: alert, cooperative and no distress Head: Normocephalic, without obvious abnormality,  atraumatic Eyes: negative Nose: Nares normal. Septum midline. Mucosa normal. No drainage or sinus tenderness. Neck: no JVD and supple, symmetrical, trachea midline Lungs: clear to auscultation bilaterally Heart: regular rate and rhythm, S1, S2 normal, no murmur, click, rub or gallop Abdomen: soft, non-tender; bowel sounds normal; no masses,  no organomegaly Extremities: extremities normal, atraumatic, no cyanosis or edema Pulses: 2+ and symmetric Skin: Skin color, texture, turgor normal. No rashes or lesions Neurologic: Grossly normal    Labs on Admission:   Recent Labs  05/08/14 1923  NA 143  K 3.9  CL 103  CO2 28  GLUCOSE 86  BUN 10  CREATININE 0.81  CALCIUM 9.4    Recent Labs  05/08/14 2004  AST 15  ALT 7  ALKPHOS 53  BILITOT 0.2*  PROT 6.7  ALBUMIN 3.4*    Recent Labs  05/08/14 2004  LIPASE 27    Recent Labs  05/08/14 1923  WBC 7.3  NEUTROABS 4.9  HGB 11.3*  HCT 35.4*  MCV 85.1  PLT 265   Radiological Exams on Admission: No results found.  Assessment/Plan  73 yo female with melena, nausea, epigastric pain likely from ugib  Principal Problem:   Melena-  On protonix.  prob ugib.  hgb over 11.  No overt bleeding now.  lovenox on hold.  Gi consult to eval adding egd with colonscopy.  H/h q 8 hours.  Active Problems:   Essential hypertension   Coronary atherosclerosis   Polymyalgia rheumatica   Microcytic anemia   Long term current use of anticoagulant therapy   UGI bleed   Nausea without vomiting h/o PE x 2  obs on med.  Full code. DAVID,RACHAL A 05/09/2014, 1:25 AM

## 2014-05-08 NOTE — ED Provider Notes (Signed)
CSN: [id number]     Arrival date & time 05/08/14  1826 History   First MD Initiated Contact with Patient 05/08/14 1843     Chief Complaint  Patient presents with  . GI Bleeding     (Consider location/radiation/quality/duration/timing/severity/associated sxs/prior Treatment) HPI   73 year old female with black appearing stool. Onset shortly before arrival. Recent diarrhea which then became black. Patient is chronically anticoagulated for history of atrial fibrillation. She reports up and schedule colonoscopy. Currently off of her Coumadin and is instead taking Lovenox. No bright red blood. No dizziness or lightheadedness. Denies any overt bleeding from anywhere else. She reports previous screening colonoscopies which were pretty unremarkable aside from what sounds like some polyps.    Past Medical History  Diagnosis Date  . ASCVD (arteriosclerotic cardiovascular disease)     stent to mid and proximal left anterior descending in 06/2002;drug eluting stent placed in the second diagnol in 08/2003 after  A  non-st elevation myocardial infarction   . Tobacco user     stopped   . Hyperlipidemia     pulmonary embolism 2000 and 09/2008  . Hypertension   . Pulmonary embolism     2000/09/2008  . Thyroid disease     hypothyroidism  . Allergy history unknown   . GERD (gastroesophageal reflux disease)   . Diabetes mellitus     no insulin  . Asthma   . FH: colonic polyps     adenomataous  . Anticoagulated   . Diarrhea     acute  . Rectal bleeding   . Pedal edema   . Irritable bowel syndrome   . Coronary artery disease   . Paroxysmal atrial fibrillation     normal LV function; episodes occurred in 205 and 09/2007  . Hypothyroidism   . Peripheral edema   . Allergy   . Atrial fibrillation   . Chronic anticoagulation   . Venous stasis   . Insomnia   . Chronic pain of left knee   . Diarrhea    Past Surgical History  Procedure Laterality Date  . Cleft palate repair      4 surgeries   . Abdominal hysterectomy    . Appendectomy    . Cholecystectomy    . Heel spur surgery    . Knee surgery    . Breast reduction surgery    . Colonoscopy  2011    negative  . Oophorectomy    . Breast surgery    . Esophagogastroduodenoscopy N/A 11/11/2013    Procedure: ESOPHAGOGASTRODUODENOSCOPY (EGD);  Surgeon: Rogene Houston, MD;  Location: [facility];  Service: Endoscopy;  Laterality: N/A;  200-moved to 100 Ann notified pt  . Balloon dilation N/A 11/11/2013    Procedure: BALLOON DILATION;  Surgeon: Rogene Houston, MD;  [facility]: [facility];  Service: Endoscopy;  Laterality: N/A;  Venia Minks dilation N/A 11/11/2013    Procedure: Venia Minks DILATION;  Surgeon: Rogene Houston, MD;  [facility]: [facility];  Service: Endoscopy;  Laterality: N/A;  . Savory dilation N/A 11/11/2013    Procedure: SAVORY DILATION;  Surgeon: Rogene Houston, MD;  [facility]: [facility];  Service: Endoscopy;  Laterality: N/A;   Family History  Problem Relation Age of Onset  . Heart attack Mother   . Diabetes Mother   . Hyperlipidemia Mother   . Heart attack Father   . Lung cancer Sister   . Lymphoma Brother   . Colon cancer Neg Hx    History  Substance  Use Topics  . Smoking status: Former Smoker -- 2.00 packs/day for 30 years    Types: Cigarettes    Start date: 05/05/1987  . Smokeless tobacco: Never Used  . Alcohol Use: No   OB History    No data available     Review of Systems  All systems reviewed and negative, other than as noted in HPI.   Allergies  Demerol  Home Medications   Prior to Admission medications   Medication Sig Start Date End Date Taking? Authorizing Provider  acetaminophen (TYLENOL) 500 MG tablet Take 500 mg by mouth daily as needed for headache.    Historical Provider, MD  albuterol (PROVENTIL HFA;VENTOLIN HFA) 108 (90 BASE) MCG/ACT inhaler Inhale 2 puffs into the lungs every 6 (six) hours as needed for wheezing or shortness of breath.    Historical Provider, MD   albuterol (PROVENTIL) (2.5 MG/3ML) 0.083% nebulizer solution Take 3 mLs (2.5 mg total) by nebulization every 6 (six) hours as needed for wheezing or shortness of breath. 06/21/13   Mikey Kirschner, MD  Cholecalciferol (VITAMIN D-3) 5000 UNITS TABS Take 2 tablets by mouth daily.     Historical Provider, MD  citalopram (CELEXA) 40 MG tablet TAKE 1 TABLET ONCE DAILY. 03/17/14   Mikey Kirschner, MD  diltiazem (CARDIZEM CD) 300 MG 24 hr capsule TAKE 1 CAPSULE BY MOUTH ONCE A DAY. 09/08/13   Lendon Colonel, NP  diphenoxylate-atropine (LOMOTIL) 2.5-0.025 MG per tablet Take 1 tablet by mouth 2 (two) times daily as needed for diarrhea or loose stools. 03/15/14   Rogene Houston, MD  enoxaparin (LOVENOX) 100 MG/ML injection Use as directed 04/27/14   Mikey Kirschner, MD  fluticasone (FLOVENT HFA) 44 MCG/ACT inhaler Inhale 2 puffs into the lungs daily as needed (Shortness of Breath).     Historical Provider, MD  furosemide (LASIX) 40 MG tablet Take 40 mg by mouth daily as needed for fluid.     Historical Provider, MD  levothyroxine (SYNTHROID, LEVOTHROID) 50 MCG tablet TAKE (1) TABLET BY MOUTH ONCE DAILY. 04/18/14   Mikey Kirschner, MD  montelukast (SINGULAIR) 10 MG tablet TAKE ONE TABLET BY MOUTH AT BEDTIME. 01/03/14   Kathyrn Drown, MD  ondansetron (ZOFRAN-ODT) 4 MG disintegrating tablet TAKE (1) TABLET EVERY EIGHT HOURS AS NEEDED FOR NAUSEA. 09/21/13   Kathyrn Drown, MD  oxycodone (OXY-IR) 5 MG capsule Take 1 capsule (5 mg total) by mouth 4 (four) times daily as needed for pain. 03/29/14   Mikey Kirschner, MD  pantoprazole (PROTONIX) 40 MG tablet Take 1 tablet (40 mg total) by mouth 2 (two) times daily before a meal. Patient taking differently: Take 40 mg by mouth daily.  11/11/13   Rogene Houston, MD  peg 3350 powder (MOVIPREP) 100 G SOLR Take 1 kit (200 g total) by mouth once. 03/15/14   Butch Penny, NP  potassium chloride SA (K-DUR,KLOR-CON) 20 MEQ tablet Take 20 mEq by mouth daily as needed. Only  takes when taking furosemide.    Historical Provider, MD  pravastatin (PRAVACHOL) 40 MG tablet TAKE ONE TABLET BY MOUTH AT BEDTIME. 02/17/14   Mikey Kirschner, MD  pregabalin (LYRICA) 75 MG capsule Take 1 capsule (75 mg total) by mouth 2 (two) times daily. 04/27/14   Mikey Kirschner, MD  valsartan (DIOVAN) 320 MG tablet TAKE (1) TABLET BY MOUTH ONCE DAILY. 04/18/14   Lendon Colonel, NP  warfarin (COUMADIN) 5 MG tablet Take 2.5-5 mg by mouth See  admin instructions. Takes 5 mg (1 tablet) on Sunday, Tuesday, Thursday, and Friday Takes 2.5 mg (1/2 tablet) on Monday, Wednesday, and saturday    Historical Provider, MD   BP 160/79 mmHg  Pulse 75  Temp(Src) 98.8 F (37.1 C) (Oral)  Resp 16  Ht $R'5\' 1"'Qf$  (1.549 m)  Wt 233 lb (105.688 kg)  BMI 44.05 kg/m2  SpO2 91% Physical Exam  Constitutional: She appears well-developed and well-nourished. No distress.  HENT:  Head: Normocephalic and atraumatic.  Eyes: Conjunctivae are normal. Right eye exhibits no discharge. Left eye exhibits no discharge.  Neck: Neck supple.  Cardiovascular: Normal rate, regular rhythm and normal heart sounds.  Exam reveals no gallop and no friction rub.   No murmur heard. Pulmonary/Chest: Effort normal and breath sounds normal. No respiratory distress.  Abdominal: Soft. She exhibits no distension. There is no tenderness.  Musculoskeletal: She exhibits no edema or tenderness.  Neurological: She is alert.  Skin: Skin is warm and dry.  Psychiatric: She has a normal mood and affect. Her behavior is normal. Thought content normal.  Nursing note and vitals reviewed.   ED Course  Procedures (including critical care time) Labs Review Labs Reviewed  CBC WITH DIFFERENTIAL - Abnormal; Notable for the following:    Hemoglobin 11.3 (*)    HCT 35.4 (*)    All other components within normal limits  BASIC METABOLIC PANEL - Abnormal; Notable for the following:    GFR calc non Af Amer 69 (*)    GFR calc Af Amer 80 (*)    All  other components within normal limits  URINALYSIS, ROUTINE W REFLEX MICROSCOPIC - Abnormal; Notable for the following:    Hgb urine dipstick TRACE (*)    Bilirubin Urine SMALL (*)    Ketones, ur 40 (*)    All other components within normal limits  HEPATIC FUNCTION PANEL - Abnormal; Notable for the following:    Albumin 3.4 (*)    Total Bilirubin 0.2 (*)    All other components within normal limits  PROTIME-INR - Abnormal; Notable for the following:    Prothrombin Time 22.6 (*)    INR 1.97 (*)    All other components within normal limits  BASIC METABOLIC PANEL - Abnormal; Notable for the following:    GFR calc non Af Amer 81 (*)    All other components within normal limits  HEMOGLOBIN AND HEMATOCRIT, BLOOD - Abnormal; Notable for the following:    Hemoglobin 10.2 (*)    HCT 32.2 (*)    All other components within normal limits  HEMOGLOBIN AND HEMATOCRIT, BLOOD - Abnormal; Notable for the following:    Hemoglobin 11.2 (*)    HCT 35.2 (*)    All other components within normal limits  HEMOGLOBIN AND HEMATOCRIT, BLOOD - Abnormal; Notable for the following:    Hemoglobin 10.3 (*)    HCT 32.7 (*)    All other components within normal limits  HEMOGLOBIN AND HEMATOCRIT, BLOOD - Abnormal; Notable for the following:    Hemoglobin 10.3 (*)    HCT 32.0 (*)    All other components within normal limits  PROTIME-INR - Abnormal; Notable for the following:    Prothrombin Time 18.9 (*)    INR 1.57 (*)    All other components within normal limits  PROTIME-INR - Abnormal; Notable for the following:    Prothrombin Time 18.0 (*)    All other components within normal limits  HEMOGLOBIN AND HEMATOCRIT, BLOOD - Abnormal; Notable for the  following:    Hemoglobin 10.4 (*)    HCT 33.0 (*)    All other components within normal limits  PROTIME-INR - Abnormal; Notable for the following:    Prothrombin Time 17.0 (*)    All other components within normal limits  CBC - Abnormal; Notable for the  following:    Hemoglobin 10.8 (*)    HCT 34.6 (*)    All other components within normal limits  OCCULT BLOOD, POC DEVICE - Abnormal; Notable for the following:    Fecal Occult Bld POSITIVE (*)    All other components within normal limits  LIPASE, BLOOD  URINE MICROSCOPIC-ADD ON  POC OCCULT BLOOD, ED  TYPE AND SCREEN  SURGICAL PATHOLOGY    Imaging Review No results found.   EKG Interpretation None      MDM   Final diagnoses:  Upper GI bleed    73 year old female with melena. Stool heme positive. On Lovenox. Scheduled upcoming colonoscopy. Given objective signs of GI bleeding cardiac regular use, will admit.    Virgel Manifold, MD 05/12/14 1149

## 2014-05-09 ENCOUNTER — Encounter (HOSPITAL_COMMUNITY)
Admission: EM | Disposition: A | Discharge status: Home / Self Care | Payer: Self-pay | Source: Home / Self Care | Attending: Emergency Medicine

## 2014-05-09 ENCOUNTER — Encounter (HOSPITAL_COMMUNITY): Payer: Self-pay

## 2014-05-09 DIAGNOSIS — I10 Essential (primary) hypertension: Secondary | ICD-10-CM | POA: Diagnosis not present

## 2014-05-09 DIAGNOSIS — R110 Nausea: Secondary | ICD-10-CM | POA: Diagnosis present

## 2014-05-09 DIAGNOSIS — K922 Gastrointestinal hemorrhage, unspecified: Secondary | ICD-10-CM

## 2014-05-09 DIAGNOSIS — Z86010 Personal history of colonic polyps: Secondary | ICD-10-CM | POA: Diagnosis not present

## 2014-05-09 DIAGNOSIS — R197 Diarrhea, unspecified: Secondary | ICD-10-CM

## 2014-05-09 DIAGNOSIS — K259 Gastric ulcer, unspecified as acute or chronic, without hemorrhage or perforation: Secondary | ICD-10-CM | POA: Diagnosis not present

## 2014-05-09 DIAGNOSIS — K921 Melena: Secondary | ICD-10-CM | POA: Diagnosis not present

## 2014-05-09 HISTORY — PX: ESOPHAGOGASTRODUODENOSCOPY: SHX5428

## 2014-05-09 LAB — BASIC METABOLIC PANEL
ANION GAP: 11 (ref 5–15)
BUN: 8 mg/dL (ref 6–23)
CALCIUM: 9.2 mg/dL (ref 8.4–10.5)
CO2: 28 mEq/L (ref 19–32)
Chloride: 105 mEq/L (ref 96–112)
Creatinine, Ser: 0.75 mg/dL (ref 0.50–1.10)
GFR calc non Af Amer: 81 mL/min — ABNORMAL LOW (ref >90)
Glucose, Bld: 84 mg/dL (ref 70–99)
Potassium: 4.1 mEq/L (ref 3.7–5.3)
Sodium: 144 mEq/L (ref 137–147)

## 2014-05-09 LAB — HEMOGLOBIN AND HEMATOCRIT, BLOOD
HCT: 32.2 % — ABNORMAL LOW (ref 36.0–46.0)
HCT: 35.2 % — ABNORMAL LOW (ref 36.0–46.0)
HCT: 32.7 % — ABNORMAL LOW (ref 36.0–46.0)
HEMATOCRIT: 32 % — AB (ref 36.0–46.0)
Hemoglobin: 10.2 g/dL — ABNORMAL LOW (ref 12.0–15.0)
Hemoglobin: 11.2 g/dL — ABNORMAL LOW (ref 12.0–15.0)
Hemoglobin: 10.3 g/dL — ABNORMAL LOW (ref 12.0–15.0)
Hemoglobin: 10.3 g/dL — ABNORMAL LOW (ref 12.0–15.0)

## 2014-05-09 LAB — OCCULT BLOOD, POC DEVICE: Fecal Occult Bld: POSITIVE — AB

## 2014-05-09 LAB — PROTIME-INR
INR: 1.57 — AB (ref 0.00–1.49)
PROTHROMBIN TIME: 18.9 s — AB (ref 11.6–15.2)

## 2014-05-09 SURGERY — EGD (ESOPHAGOGASTRODUODENOSCOPY)
Anesthesia: Moderate Sedation

## 2014-05-09 MED ORDER — MIDAZOLAM HCL 5 MG/5ML IJ SOLN
INTRAMUSCULAR | Status: DC | PRN
  Administered 2014-05-09 (×3): 2 mg via INTRAVENOUS

## 2014-05-09 MED ORDER — BUTAMBEN-TETRACAINE-BENZOCAINE 2-2-14 % EX AERO
INHALATION_SPRAY | CUTANEOUS | Status: DC | PRN
  Administered 2014-05-09: 2 via TOPICAL

## 2014-05-09 MED ORDER — MEPERIDINE HCL 50 MG/ML IJ SOLN
INTRAMUSCULAR | Status: AC
  Filled 2014-05-09: qty 1

## 2014-05-09 MED ORDER — MIDAZOLAM HCL 5 MG/5ML IJ SOLN
INTRAMUSCULAR | Status: AC
  Filled 2014-05-09: qty 10

## 2014-05-09 MED ORDER — FENTANYL CITRATE 0.05 MG/ML IJ SOLN
INTRAMUSCULAR | Status: AC
  Filled 2014-05-09: qty 2

## 2014-05-09 MED ORDER — PEG 3350-KCL-NA BICARB-NACL 420 G PO SOLR
4000.0000 mL | Freq: Once | ORAL | Status: AC
  Administered 2014-05-10: 4000 mL via ORAL
  Filled 2014-05-09 (×2): qty 4000

## 2014-05-09 MED ORDER — STERILE WATER FOR IRRIGATION IR SOLN
Status: DC | PRN
  Administered 2014-05-09: 13:00:00

## 2014-05-09 MED ORDER — SODIUM CHLORIDE 0.9 % IV SOLN
INTRAVENOUS | Status: DC
  Administered 2014-05-09: 13:00:00 via INTRAVENOUS

## 2014-05-09 MED ORDER — FENTANYL CITRATE 0.05 MG/ML IJ SOLN
INTRAMUSCULAR | Status: DC | PRN
  Administered 2014-05-09 (×3): 25 ug via INTRAVENOUS

## 2014-05-09 NOTE — Progress Notes (Signed)
UR completed 

## 2014-05-09 NOTE — Progress Notes (Signed)
Nutrition Brief Note  Patient identified on the Malnutrition Screening Tool (MST) Report  Wt Readings from Last 15 Encounters:  05/09/14 228 lb (103.42 kg)  04/27/14 232 lb (105.235 kg)  03/29/14 233 lb (105.688 kg)  03/15/14 230 lb 8 oz (104.554 kg)  11/11/13 233 lb (105.688 kg)  11/05/13 233 lb (105.688 kg)  10/25/13 233 lb 12.8 oz (106.051 kg)  10/21/13 238 lb (107.956 kg)  08/12/13 238 lb (107.956 kg)  06/21/13 230 lb (104.327 kg)  06/17/13 240 lb (108.863 kg)  05/24/13 236 lb (107.049 kg)  05/17/13 234 lb (106.142 kg)  05/03/13 234 lb 14.4 oz (106.55 kg)  04/28/13 237 lb 9.6 oz (107.775 kg)    Body mass index is 43.1 kg/(m^2). Patient meets criteria for obesity class III based on current BMI. Modest weight loss is not clinically significant.  Current diet order is NPO. Labs and medications reviewed. Presented with melena and anemia. She has a hx of peptic ulcer disease and is s/p esophageal dialation back in May of this year.  No nutrition interventions warranted at this time. If nutrition issues arise, please consult RD.   Colman Cater MS,RD,CSG,LDN Office: [phone] Pager: [phone]

## 2014-05-09 NOTE — Op Note (Signed)
EGD PROCEDURE REPORT  PATIENT:  [name]  MR#:  [id number] Birthdate:  19-Nov-1940, 73 y.o., female Endoscopist:  Dr. Rogene Houston, MD Referred By:  Dr. Rayvon Char. Raelene Bott, MD Procedure Date: 05/09/2014  Procedure:   EGD  Indications:  Patient is 73 year old Caucasian female who presents with melena. She is anticoagulated because of history of pulmonary embolism. She was found to have prepyloric ulcer back in May 2015. She is undergoing diagnostic EGD.           Informed Consent:  The risks, benefits, alternatives & imponderables which include, but are not limited to, bleeding, infection, perforation, drug reaction and potential missed lesion have been reviewed.  The potential for biopsy, lesion removal, esophageal dilation, etc. have also been discussed.  Questions have been answered.  All parties agreeable.  Please see history & physical in medical record for more information.  Medications:  Fentanyl 75 mcg  IV Versed 6 mg IV Cetacaine spray topically for oropharyngeal anesthesia  Description of procedure:  The endoscope was introduced through the mouth and advanced to the second portion of the duodenum without difficulty or limitations. The mucosal surfaces were surveyed very carefully during advancement of the scope and upon withdrawal.  Findings:  Esophagus:  Mucosa of the esophagus was normal. GE junction was unremarkable. No resistance noted as the scope was passed across it. GEJ:  38 cm Stomach:  Stomach was empty and distended very well with insufflation. Folds in the proximal stomach were normal. Examination of mucosa at body was normal. Two  erosions were noted at proximal antrum. There were two scars in the prepyloric regionone corresponding to site of previous ulceration. No active ulcer identified. Pyloric channel was patent. Angularis fundus and cardia were unremarkable. Duodenum:  Normal bulbar and post bulbar mucosa.  Therapeutic/Diagnostic Maneuvers Performed:   None  Complications:  none  Impression: Gastric ulcer( May 2015) has completely healed. Two antral erosions. No bleeding lesions identified on this exam  Recommendations:  Possible colonoscopy in a.m. If patient is willing and able to tolerate prep.  Derreck Wiltsey U  05/09/2014  1:30 PM  CC: Dr. Rubbie Battiest, MD & Dr. Rayne Du ref. provider found

## 2014-05-09 NOTE — Progress Notes (Signed)
[facility] PROGRESS NOTE  [name] [id number] DOB: [birth year] DOA: 05/08/2014 PCP: Rubbie Battiest, MD    Interim summary: 73 year old lady with h/o hypertension, pulmonary embolism comes in for epigastric pain, and severe nausea. She was found to have occult blood positive stools. She was admitted to medical service and a gastroenterologist consulted. She was scheduled for EGD today. Assessment/Plan: 1. Malena: Admit to telemetry.  On IV PPI. q8hr H&H, coumadin on hold.  Gastroenterology consulted.  Hydration and IV emetics  Hypertension: controlled.    Pulmonary embolism: - anticoagulation on hold for gi bleed.     Code Status: full code. Family Communication: none at bedside Disposition Plan: pending   Consultants:  gastroenterology  Procedures:  EGD  Antibiotics:  NONE  HPI/Subjective: Nauseated.   Objective: Filed Vitals:   05/09/14 1335  BP: 137/65  Pulse: 58  Temp:   Resp: 20   No intake or output data in the 24 hours ending 05/09/14 1345 Filed Weights   05/08/14 1830 05/08/14 2336 05/09/14 0826  Weight: 105.688 kg (233 lb) 102.876 kg (226 lb 12.8 oz) 103.42 kg (228 lb)    Exam:   General:  Alert afebrile comfortable  Cardiovascular: s1s2oMRG  Respiratory: chest clear to auscultation  Abdomen: soft non tender non distended bowel sounds heard  Musculoskeletal: trace pedal edema.   Data Reviewed: Basic Metabolic Panel:  Recent Labs Lab 05/08/14 1923 05/09/14 0738  NA 143 144  K 3.9 4.1  CL 103 105  CO2 28 28  GLUCOSE 86 84  BUN 10 8  CREATININE 0.81 0.75  CALCIUM 9.4 9.2   Liver Function Tests:  Recent Labs Lab 05/08/14 2004  AST 15  ALT 7  ALKPHOS 53  BILITOT 0.2*  PROT 6.7  ALBUMIN 3.4*    Recent Labs Lab 05/08/14 2004  LIPASE 27   No results for input(s): AMMONIA in the last 168 hours. CBC:  Recent Labs Lab 05/08/14 1923 05/09/14 0044 05/09/14 0738  WBC 7.3  --   --   NEUTROABS 4.9  --    --   HGB 11.3* 10.2* 11.2*  HCT 35.4* 32.2* 35.2*  MCV 85.1  --   --   PLT 265  --   --    Cardiac Enzymes: No results for input(s): CKTOTAL, CKMB, CKMBINDEX, TROPONINI in the last 168 hours. BNP (last 3 results) No results for input(s): PROBNP in the last 8760 hours. CBG: No results for input(s): GLUCAP in the last 168 hours.  No results found for this or any previous visit (from the past 240 hour(s)).   Studies: No results found.  Scheduled Meds: . [MAR Hold] diltiazem  300 mg Oral Daily  . fentaNYL      . [MAR Hold] levothyroxine  50 mcg Oral QAC breakfast  . meperidine      . midazolam      . [MAR Hold] pantoprazole  40 mg Oral BID AC  . [MAR Hold] sodium chloride  3 mL Intravenous Q12H   Continuous Infusions: . sodium chloride 20 mL/hr at 05/09/14 1241    Principal Problem:   Melena Active Problems:   Essential hypertension   Coronary atherosclerosis   Polymyalgia rheumatica   Microcytic anemia   Long term current use of anticoagulant therapy   UGI bleed   Nausea without vomiting    Time spent: 73min    Syreeta Figler  [facility] Pager [phone] If 7PM-7AM, please contact night-coverage at [email or web address], password [facility] 05/09/2014, 1:45 PM  LOS: 1 day

## 2014-05-09 NOTE — Consult Note (Signed)
Referring Provider: Arlana Hove Primary Care Physician:  Rubbie Battiest, MD Primary Gastroenterologist:  Dr. Laural Golden  Reason for Consultation:    Melena and anemia.   HPI:   Patient is 73 year old Caucasian female who has history ofGERD with stricture and peptic ulcer diseasewasn't usual state of felt until 3 days prior to admission when she developed low-grade fever and nausea and just did not feed well. She did not experience cough best pain shortness of breath or headache. She did not have diarrhea either. She has been on clear liquids for this duration. Patient is on warfarin because of history of pulmonary embolism.in preparation for colonoscopy to be performed on 05/11/2014 she was begun on Lovenox 05/06/2014 when warfarin was discontinued. Patient states she had formed stool 2 days ago. Yesterday evening when she had 2 bowel movements which were loose and tarry. She knew she was bleeding and therefore called 911 and was brought to emergency room for evaluation. Stool was noted to be black and heme-INR was 1.97. Patient was admitted to hospitalist service.she remains with nausea without vomiting. She has not had any more bowel movements.she has remained afebrile since she's been the hospital. Gastric erosions and a small antral ulcer She has history of peptic ulcer disease. She had EGD with ED on 11/11/2013 when she was found to have a high-grade distal esophageal stricture which was dilated to 16.5 mm. Esophageal mucosal biopsy was negative for eosinophilic esophagitis. She is also found to have antral ulcer. H. Pylori serology was negative and she's been maintained on PPI. Patient denies taking NSAIDs. Patient has chronic diarrhea and she was scheduled to undergo diagnostic colonoscopy on 05/11/2014.    Past Medical History  Diagnosis Date  . ASCVD (arteriosclerotic cardiovascular disease)     stent to mid and proximal left anterior descending in 06/2002;drug eluting stent placed in the second  diagnol in 08/2003 after  A  non-st elevation myocardial infarction   . Tobacco user     stopped   . Hyperlipidemia     pulmonary embolism 2000 and 09/2008  . Hypertension   . Pulmonary embolism     2000/09/2008  . Thyroid disease     hypothyroidism  . Allergy history unknown   . GERD (gastroesophageal reflux disease)   . Diabetes mellitus     no insulin  . Asthma   . FH: colonic polyps     adenomataous  . Anticoagulated   . Diarrhea     acute  . Rectal bleeding   . Pedal edema   . Irritable bowel syndrome   . Coronary artery disease   . Paroxysmal atrial fibrillation     normal LV function; episodes occurred in 205 and 09/2007  . Hypothyroidism   . Peripheral edema   . Allergy   . Atrial fibrillation   . Chronic anticoagulation   . Venous stasis   . Insomnia   . Chronic pain of left knee   . Diarrhea     Past Surgical History  Procedure Laterality Date  . Cleft palate repair      4 surgeries  . Abdominal hysterectomy    . Appendectomy    . Cholecystectomy    . Heel spur surgery    . Knee surgery    . Breast reduction surgery    . Colonoscopy  2011    negative  . Oophorectomy    . Breast surgery    . Esophagogastroduodenoscopy N/A 11/11/2013    Procedure: ESOPHAGOGASTRODUODENOSCOPY (EGD);  Surgeon: Bernadene Person  Gloriann Loan, MD;  Location: [facility];  Service: Endoscopy;  Laterality: N/A;  200-moved to 100 Ann notified pt  . Balloon dilation N/A 11/11/2013    Procedure: BALLOON DILATION;  Surgeon: Rogene Houston, MD;  Location: [facility];  Service: Endoscopy;  Laterality: N/A;  Venia Minks dilation N/A 11/11/2013    Procedure: Venia Minks DILATION;  Surgeon: Rogene Houston, MD;  Location: [facility];  Service: Endoscopy;  Laterality: N/A;  . Savory dilation N/A 11/11/2013    Procedure: SAVORY DILATION;  Surgeon: Rogene Houston, MD;  Location: [facility];  Service: Endoscopy;  Laterality: N/A;    Prior to Admission medications   Medication Sig Start Date End Date  Taking? Authorizing Provider  acetaminophen (TYLENOL) 500 MG tablet Take 500 mg by mouth daily as needed for headache.   Yes Historical Provider, MD  albuterol (PROVENTIL HFA;VENTOLIN HFA) 108 (90 BASE) MCG/ACT inhaler Inhale 2 puffs into the lungs every 6 (six) hours as needed for wheezing or shortness of breath.   Yes Historical Provider, MD  albuterol (PROVENTIL) (2.5 MG/3ML) 0.083% nebulizer solution Take 3 mLs (2.5 mg total) by nebulization every 6 (six) hours as needed for wheezing or shortness of breath. 06/21/13  Yes Mikey Kirschner, MD  Cholecalciferol (VITAMIN D-3) 5000 UNITS TABS Take 2 tablets by mouth daily.    Yes Historical Provider, MD  citalopram (CELEXA) 40 MG tablet Take 40 mg by mouth daily.   Yes Historical Provider, MD  diltiazem (CARDIZEM CD) 300 MG 24 hr capsule Take 300 mg by mouth daily.   Yes Historical Provider, MD  diphenoxylate-atropine (LOMOTIL) 2.5-0.025 MG per tablet Take 1 tablet by mouth 2 (two) times daily as needed for diarrhea or loose stools. 03/15/14  Yes Rogene Houston, MD  enoxaparin (LOVENOX) 100 MG/ML injection Use as directed Patient taking differently: Inject 100 mg into the skin daily. Use as directed 04/27/14  Yes Mikey Kirschner, MD  fluticasone (FLOVENT HFA) 44 MCG/ACT inhaler Inhale 2 puffs into the lungs daily as needed (Shortness of Breath).    Yes Historical Provider, MD  levothyroxine (SYNTHROID, LEVOTHROID) 50 MCG tablet Take 50 mcg by mouth daily before breakfast.   Yes Historical Provider, MD  montelukast (SINGULAIR) 10 MG tablet Take 10 mg by mouth at bedtime.   Yes Historical Provider, MD  ondansetron (ZOFRAN-ODT) 4 MG disintegrating tablet Take 4 mg by mouth every 8 (eight) hours as needed for nausea or vomiting.   Yes Historical Provider, MD  oxycodone (OXY-IR) 5 MG capsule Take 1 capsule (5 mg total) by mouth 4 (four) times daily as needed for pain. 03/29/14  Yes Mikey Kirschner, MD  pantoprazole (PROTONIX) 40 MG tablet Take 1 tablet (40  mg total) by mouth 2 (two) times daily before a meal. Patient taking differently: Take 40 mg by mouth 2 (two) times daily.  11/11/13  Yes Rogene Houston, MD  pravastatin (PRAVACHOL) 40 MG tablet Take 40 mg by mouth at bedtime.   Yes Historical Provider, MD  valsartan (DIOVAN) 320 MG tablet Take 320 mg by mouth daily.   Yes Historical Provider, MD  citalopram (CELEXA) 40 MG tablet TAKE 1 TABLET ONCE DAILY. Patient not taking: Reported on 05/08/2014 03/17/14   Mikey Kirschner, MD  diltiazem (CARDIZEM CD) 300 MG 24 hr capsule TAKE 1 CAPSULE BY MOUTH ONCE A DAY. Patient not taking: Reported on 05/08/2014 09/08/13   Lendon Colonel, NP  furosemide (LASIX) 40 MG tablet Take 40  mg by mouth daily as needed for fluid.     Historical Provider, MD  levothyroxine (SYNTHROID, LEVOTHROID) 50 MCG tablet TAKE (1) TABLET BY MOUTH ONCE DAILY. Patient not taking: Reported on 05/08/2014 04/18/14   Mikey Kirschner, MD  montelukast (SINGULAIR) 10 MG tablet TAKE ONE TABLET BY MOUTH AT BEDTIME. Patient not taking: Reported on 05/08/2014 01/03/14   Kathyrn Drown, MD  ondansetron (ZOFRAN-ODT) 4 MG disintegrating tablet TAKE (1) TABLET EVERY EIGHT HOURS AS NEEDED FOR NAUSEA. Patient not taking: Reported on 05/08/2014 09/21/13   Kathyrn Drown, MD  peg 3350 powder (MOVIPREP) 100 G SOLR Take 1 kit (200 g total) by mouth once. 03/15/14   Butch Penny, NP  potassium chloride SA (K-DUR,KLOR-CON) 20 MEQ tablet Take 20 mEq by mouth daily as needed. Only takes when taking furosemide.    Historical Provider, MD  pravastatin (PRAVACHOL) 40 MG tablet TAKE ONE TABLET BY MOUTH AT BEDTIME. Patient not taking: Reported on 05/08/2014 02/17/14   Mikey Kirschner, MD  pregabalin (LYRICA) 75 MG capsule Take 1 capsule (75 mg total) by mouth 2 (two) times daily. Patient not taking: Reported on 05/08/2014 04/27/14   Mikey Kirschner, MD  valsartan (DIOVAN) 320 MG tablet TAKE (1) TABLET BY MOUTH ONCE DAILY. Patient not taking: Reported on  05/08/2014 04/18/14   Lendon Colonel, NP  warfarin (COUMADIN) 5 MG tablet Take 2.5-5 mg by mouth See admin instructions. Takes 5 mg (1 tablet) on Sunday, Tuesday, Thursday, and Friday Takes 2.5 mg (1/2 tablet) on Monday, Wednesday, and saturday    Historical Provider, MD    Current Facility-Administered Medications  Medication Dose Route Frequency Provider Last Rate Last Dose  . 0.9 %  sodium chloride infusion  250 mL Intravenous PRN Phillips Grout, MD      . 0.9 %  sodium chloride infusion   Intravenous STAT Nat Christen, MD 125 mL/hr at 05/09/14 0001    . diltiazem (CARDIZEM CD) 24 hr capsule 300 mg  300 mg Oral Daily Rachal A Shanon Brow, MD      . levothyroxine (SYNTHROID, LEVOTHROID) tablet 50 mcg  50 mcg Oral QAC breakfast Phillips Grout, MD      . ondansetron [facility]) tablet 4 mg  4 mg Oral Q6H PRN Phillips Grout, MD       Or  . ondansetron [facility]) injection 4 mg  4 mg Intravenous Q6H PRN Phillips Grout, MD   4 mg at 05/09/14 0836  . oxyCODONE (Oxy IR/ROXICODONE) immediate release tablet 5 mg  5 mg Oral QID PRN Phillips Grout, MD      . pantoprazole (PROTONIX) EC tablet 40 mg  40 mg Oral BID AC Rachal A Shanon Brow, MD      . sodium chloride 0.9 % injection 3 mL  3 mL Intravenous Q12H Phillips Grout, MD   3 mL at 05/09/14 0001  . sodium chloride 0.9 % injection 3 mL  3 mL Intravenous PRN Phillips Grout, MD        Allergies as of 05/08/2014 - Review Complete 05/08/2014  Allergen Reaction Noted  . Demerol Nausea And Vomiting 03/04/2011    Family History  Problem Relation Age of Onset  . Heart attack Mother   . Diabetes Mother   . Hyperlipidemia Mother   . Heart attack Father   . Lung cancer Sister   . Lymphoma Brother   . Colon cancer Neg Hx     History   Social History  .  Marital Status: Widowed    Spouse Name: N/A    Number of Children: N/A  . Years of Education: N/A   Occupational History  . retired    Social History Main Topics  . Smoking status: Former Smoker -- 2.00  packs/day for 30 years    Types: Cigarettes    Start date: 05/05/1987  . Smokeless tobacco: Never Used  . Alcohol Use: No  . Drug Use: No  . Sexual Activity: No   Other Topics Concern  . Not on file   Social History Narrative    Review of Systems: See HPI, otherwise normal ROS  Physical Exam: Temp:  [98.6 F (37 C)-98.9 F (37.2 C)] 98.6 F (37 C) (11/16 0520) Pulse Rate:  [62-72] 64 (11/16 0520) Resp:  [12-20] 20 (11/16 0520) BP: (140-175)/(58-87) 175/74 mmHg (11/16 0520) SpO2:  [91 %-99 %] 99 % (11/16 0520) Weight:  [226 lb 12.8 oz (102.876 kg)-233 lb (105.688 kg)] 228 lb (103.42 kg) (11/16 0826) Last BM Date: 05/08/14 Patient is alert and in no acute distress. Conjunctiva is pink. Sclera is nonicteric. Oropharyngeal mucosa reveals upper and lower dentures and scar involving hard palate an absent uvula. No neck masses or thyromegaly noted. Cardiac exam with regular rhythm normal S1 and S2. No murmur or gallop noted. Abdomen is obese with 2 areas with ecchymosis. Bowel sounds are normal. On palpation abdomen is soft and nontender without organomegaly or masses. Extremities without edema or clubbing.   Lab Results:  Recent Labs  05/08/14 1923 05/09/14 0044 05/09/14 0738  WBC 7.3  --   --   HGB 11.3* 10.2* 11.2*  HCT 35.4* 32.2* 35.2*  PLT 265  --   --    BMET  Recent Labs  05/08/14 1923 05/09/14 0738  NA 143 144  K 3.9 4.1  CL 103 105  CO2 28 28  GLUCOSE 86 84  BUN 10 8  CREATININE 0.81 0.75  CALCIUM 9.4 9.2   LFT  Recent Labs  05/08/14 2004  PROT 6.7  ALBUMIN 3.4*  AST 15  ALT 7  ALKPHOS 53  BILITOT 0.2*  BILIDIR <0.2  IBILI NOT CALCULATED   PT/INR  Recent Labs  05/08/14 2112  LABPROT 22.6*  INR 1.97*    Assessment;  #1. Patient is 73 year old Caucasian female who presents with melena and drop in her H&H. She has history of gastric ulcer diagnosed in May this year. Present illness began with low-grade fever and nausea. She  could've bled from gastritis in setting of anticoagulation or from gastric ulcer. If upper GI tract is unremarkable then she would need further workup. #2. Chronic diarrhea. Patient was scheduled for colonoscopy later this week. I am not sure whether or not she will be able to tolerate prep for colonoscopy given nausea.  Recommendations; Will check INR this morning. Diagnostic esophagogastroduodenoscopy later today.   LOS: 1 day   REHMAN,NAJEEB U  05/09/2014, 9:04 AM

## 2014-05-10 ENCOUNTER — Ambulatory Visit: Payer: Medicare Other

## 2014-05-10 ENCOUNTER — Encounter (HOSPITAL_COMMUNITY)
Admission: EM | Disposition: A | Discharge status: Home / Self Care | Payer: Self-pay | Source: Home / Self Care | Attending: Emergency Medicine

## 2014-05-10 DIAGNOSIS — R110 Nausea: Secondary | ICD-10-CM | POA: Diagnosis not present

## 2014-05-10 DIAGNOSIS — K649 Unspecified hemorrhoids: Secondary | ICD-10-CM | POA: Diagnosis not present

## 2014-05-10 DIAGNOSIS — I10 Essential (primary) hypertension: Secondary | ICD-10-CM | POA: Diagnosis not present

## 2014-05-10 DIAGNOSIS — K5730 Diverticulosis of large intestine without perforation or abscess without bleeding: Secondary | ICD-10-CM | POA: Diagnosis not present

## 2014-05-10 DIAGNOSIS — K6389 Other specified diseases of intestine: Secondary | ICD-10-CM | POA: Diagnosis not present

## 2014-05-10 DIAGNOSIS — K921 Melena: Secondary | ICD-10-CM | POA: Diagnosis not present

## 2014-05-10 DIAGNOSIS — D125 Benign neoplasm of sigmoid colon: Secondary | ICD-10-CM | POA: Diagnosis not present

## 2014-05-10 HISTORY — PX: COLONOSCOPY: SHX5424

## 2014-05-10 LAB — HEMOGLOBIN AND HEMATOCRIT, BLOOD
HCT: 33 % — ABNORMAL LOW (ref 36.0–46.0)
Hemoglobin: 10.4 g/dL — ABNORMAL LOW (ref 12.0–15.0)

## 2014-05-10 LAB — PROTIME-INR
INR: 1.47 (ref 0.00–1.49)
Prothrombin Time: 18 seconds — ABNORMAL HIGH (ref 11.6–15.2)

## 2014-05-10 SURGERY — COLONOSCOPY
Anesthesia: Moderate Sedation

## 2014-05-10 MED ORDER — MIDAZOLAM HCL 5 MG/5ML IJ SOLN
INTRAMUSCULAR | Status: AC
  Filled 2014-05-10: qty 10

## 2014-05-10 MED ORDER — MIDAZOLAM HCL 5 MG/5ML IJ SOLN
INTRAMUSCULAR | Status: DC | PRN
  Administered 2014-05-10 (×3): 2 mg via INTRAVENOUS

## 2014-05-10 MED ORDER — SODIUM CHLORIDE 0.9 % IV SOLN
INTRAVENOUS | Status: DC
  Administered 2014-05-10: 13:00:00 via INTRAVENOUS

## 2014-05-10 MED ORDER — FENTANYL CITRATE 0.05 MG/ML IJ SOLN
INTRAMUSCULAR | Status: DC | PRN
  Administered 2014-05-10 (×3): 25 ug via INTRAVENOUS

## 2014-05-10 MED ORDER — FENTANYL CITRATE 0.05 MG/ML IJ SOLN
INTRAMUSCULAR | Status: AC
  Filled 2014-05-10: qty 2

## 2014-05-10 MED ORDER — WARFARIN - PHARMACIST DOSING INPATIENT
Status: DC
  Administered 2014-05-10: 16:00:00

## 2014-05-10 MED ORDER — WARFARIN SODIUM 5 MG PO TABS
5.0000 mg | ORAL_TABLET | Freq: Once | ORAL | Status: AC
  Administered 2014-05-10: 5 mg via ORAL
  Filled 2014-05-10: qty 1

## 2014-05-10 MED ORDER — SIMETHICONE 40 MG/0.6ML PO SUSP
ORAL | Status: DC | PRN
  Administered 2014-05-10: 13:00:00

## 2014-05-10 NOTE — Progress Notes (Addendum)
[facility] PROGRESS NOTE  [name] [id number] DOB: 01/17/1941 DOA: 05/08/2014 PCP: [name] Battiest, MD    Interim summary: 73 year old lady with h/o hypertension, pulmonary embolism comes in for epigastric pain, and severe nausea. She was found to have occult blood positive stools. She was admitted to medical service and a gastroenterologist consulted. She underwent EGD on 11/16 showing two antral lesions and a gastric ulcer that has completely healed. She was scheduled for colonoscopy on 11/17.   Assessment/Plan: 1. Melena: She was admitted to telemetry and started on IV PPI which was later on transitioned to po protonix twice daily. Gastroenterology consulted and recommendations given. she underwent EGD showing antral lesions without any bleeding. She is scheduled for a colonoscopy on 11/17 and is drinking the prep   Hypertension: controlled.    Pulmonary embolism: Resume coumadin from tonight. Discussed with [name].    Hypothyroidism: Resume synthroid    Code Status: full code. Family Communication: none at bedside Disposition Plan: pending   Consultants:  gastroenterology  Procedures:  EGD  Scheduled for colonoscopy later today.   Antibiotics:  NONE  HPI/Subjective: Nauseated. Had  BM today.   Objective: Filed Vitals:   05/10/14 1315  BP:   Pulse: 61  Temp:   Resp: 22    Intake/Output Summary (Last 24 hours) at 05/10/14 1320 Last data filed at 05/10/14 0900  Gross per 24 hour  Intake    480 ml  Output    400 ml  Net     80 ml   Filed Weights   05/08/14 2336 05/09/14 0826 05/10/14 0300  Weight: 102.876 kg (226 lb 12.8 oz) 103.42 kg (228 lb) 106.6 kg (235 lb 0.2 oz)    Exam:   General:  Alert afebrile comfortable  Cardiovascular: s1s2, noMRG  Respiratory: chest clear to auscultation  Abdomen: soft non tender non distended bowel sounds heard  Musculoskeletal: trace pedal edema.   Data Reviewed: Basic Metabolic  Panel:  Recent Labs Lab 05/08/14 1923 05/09/14 0738  NA 143 144  K 3.9 4.1  CL 103 105  CO2 28 28  GLUCOSE 86 84  BUN 10 8  CREATININE 0.81 0.75  CALCIUM 9.4 9.2   Liver Function Tests:  Recent Labs Lab 05/08/14 2004  AST 15  ALT 7  ALKPHOS 53  BILITOT 0.2*  PROT 6.7  ALBUMIN 3.4*    Recent Labs Lab 05/08/14 2004  LIPASE 27   No results for input(s): AMMONIA in the last 168 hours. CBC:  Recent Labs Lab 05/08/14 1923 05/09/14 0044 05/09/14 0738 05/09/14 1535 05/09/14 2306 05/10/14 0727  WBC 7.3  --   --   --   --   --   NEUTROABS 4.9  --   --   --   --   --   HGB 11.3* 10.2* 11.2* 10.3* 10.3* 10.4*  HCT 35.4* 32.2* 35.2* 32.7* 32.0* 33.0*  MCV 85.1  --   --   --   --   --   PLT 265  --   --   --   --   --    Cardiac Enzymes: No results for input(s): CKTOTAL, CKMB, CKMBINDEX, TROPONINI in the last 168 hours. BNP (last 3 results) No results for input(s): PROBNP in the last 8760 hours. CBG: No results for input(s): GLUCAP in the last 168 hours.  No results found for this or any previous visit (from the past 240 hour(s)).   Studies: No results found.  Scheduled  Meds: . diltiazem  300 mg Oral Daily  . fentaNYL      . levothyroxine  50 mcg Oral QAC breakfast  . midazolam      . pantoprazole  40 mg Oral BID AC  . sodium chloride  3 mL Intravenous Q12H   Continuous Infusions: . sodium chloride 20 mL/hr at 05/10/14 1307    Principal Problem:   Melena Active Problems:   Essential hypertension   Coronary atherosclerosis   Polymyalgia rheumatica   Microcytic anemia   Long term current use of anticoagulant therapy   UGI bleed   Nausea without vomiting    Time spent: 40min    [name]  [facility] Pager [phone] If 7PM-7AM, please contact night-coverage at [email or web address], password [name] 05/10/2014, 1:20 PM  LOS: 2 days

## 2014-05-10 NOTE — Progress Notes (Signed)
UR completed 

## 2014-05-10 NOTE — Care Management Note (Addendum)
    Page 1 of 1   05/11/2014     1:44:03 PM CARE MANAGEMENT NOTE 05/11/2014  Patient:  [name], [name]   Account Number:  [id number]  Date Initiated:  05/10/2014  Documentation initiated by:  Theophilus Kinds  Subjective/Objective Assessment:   Pt admitted from home with gi bleed. Pt lives  alone and has family and friends that check on her frequently. Pt stated that she is very independent and still drives. Pt has a walker for prn use.     Action/Plan:   No CM needs noted.   Anticipated DC Date:  05/11/2014   Anticipated DC Plan:  [place]  CM consult      Choice offered to / List presented to:             Status of service:  Completed, signed off Medicare Important Message given?   (If response is "NO", the following Medicare IM given date fields will be blank) Date Medicare IM given:   Medicare IM given by:   Date Additional Medicare IM given:   Additional Medicare IM given by:    Discharge Disposition:  HOME/SELF CARE  Per UR Regulation:    If discussed at Long Length of Stay Meetings, dates discussed:    Comments:  05/11/14 [place], RN BSN CM Pt discharged home today on lovenox. Pt has given herself lovenox in the past and stated that she feels comfortable with going home with lovenox today. She stated that she does not feel she needs HH at this time. Pt is aware of when to have PT/INR checked on 05/13/14. Pt and pts nurse aware of discharge arrangements.  05/10/14 [place], RN BSN CM

## 2014-05-10 NOTE — Op Note (Signed)
COLONOSCOPY PROCEDURE REPORT  PATIENT:  [name]  MR#:  [id number] Birthdate:  [birth year], 73 y.o., female Endoscopist:  Dr. Rogene Houston, MD Referred By:  Dr. Hosie Poisson, MD Procedure Date: 05/10/2014  Procedure:   Colonoscopy  Indications:  Patient is 73 year old Caucasian female who presents with melena and anemia. Patient is chronically anticoagulated. She was scheduled to undergo elective colonoscopy on 05/11/2014 and was being bridged with Lovenox while warfarin was on hold. She underwent diagnostic EGD which revealed no bleeding source and gastric ulcer had completely healed. She is therefore undergoing diagnostic colonoscopy. He also has chronic diarrhea felt to be due to IBS.  Informed Consent:  The procedure and risks were reviewed with the patient and informed consent was obtained.  Medications:  Fentanyl 75 mcg IV Versed 6 mg IV  Description of procedure:  After a digital rectal exam was performed, that colonoscope was advanced from the anus through the rectum and colon to the area of the cecum, ileocecal valve and appendiceal orifice. The cecum was deeply intubated. These structures were well-seen and photographed for the record. From the level of the cecum and ileocecal valve, the scope was slowly and cautiously withdrawn. The mucosal surfaces were carefully surveyed utilizing scope tip to flexion to facilitate fold flattening as needed. The scope was pulled down into the rectum where a thorough exam including retroflexion was performed.  Findings:   Prep excellent. 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 All of these  lesions were ablated with argon plasma coagulator. Single small diverticulum at descending colon along with scattered diverticula at sigmoid colon. 4 mm polyp ablated via cold biopsy from sigmoid colon. Random biopsies taken from normal-appearing mucosa of  sigmoid colon. Normal rectal mucosa. Small hemorrhoids below the dentate line.   Therapeutic/Diagnostic Maneuvers Performed:  See above  Complications:  none  Cecal Withdrawal Time:  20 minutes  Impression:  Examination performed to cecum. 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 These lesions were covered with fresh blood and clot. These lesions were ablated with argon plasma coagulator. Single diverticulum at ascending colon with few more at sigmoid colon. 4 mm polyp ablated via cold biopsy from sigmoid colon. Random biopsies taken from normal appearing mucosa of sigmoid colon given history of chronic diarrhea Small external hemorrhoids.  Recommendations:  Can resume warfarin at usual dose starting this evening. CBC in a.m. Patient should be able to go home tomorrow.  Raynesha Tiedt U  05/10/2014 2:13 PM  CC: Dr. Rubbie Battiest, MD & Dr. Rayne Du ref. provider found

## 2014-05-10 NOTE — Progress Notes (Signed)
ANTICOAGULATION CONSULT NOTE - Initial Consult  Pharmacy Consult for Coumadin (chronic Rx) Indication: pulmonary embolus  Allergies  Allergen Reactions  . Demerol Nausea And Vomiting   Patient Measurements: Height: 5' 1"  (154.9 cm) Weight: 235 lb 0.2 oz (106.6 kg) IBW/kg (Calculated) : 47.8  Vital Signs: Temp: 98.1 F (36.7 C) (11/17 1246) Temp Source: Oral (11/17 1246) BP: 127/51 mmHg (11/17 1400) Pulse Rate: 55 (11/17 1400)  Labs:  Recent Labs  05/08/14 1923 05/08/14 2112  05/09/14 0738 05/09/14 1008 05/09/14 1535 05/09/14 2306 05/10/14 0727  HGB 11.3*  --   < > 11.2*  --  10.3* 10.3* 10.4*  HCT 35.4*  --   < > 35.2*  --  32.7* 32.0* 33.0*  PLT 265  --   --   --   --   --   --   --   LABPROT  --  22.6*  --   --  18.9*  --   --  18.0*  INR  --  1.97*  --   --  1.57*  --   --  1.47  CREATININE 0.81  --   --  0.75  --   --   --   --   < > = values in this interval not displayed.  Estimated Creatinine Clearance: 68.4 mL/min (by C-G formula based on Cr of 0.75).  Medical History: Past Medical History  Diagnosis Date  . ASCVD (arteriosclerotic cardiovascular disease)     stent to mid and proximal left anterior descending in 06/2002;drug eluting stent placed in the second diagnol in 08/2003 after  A  non-st elevation myocardial infarction   . Tobacco user     stopped   . Hyperlipidemia     pulmonary embolism 2000 and 09/2008  . Hypertension   . Pulmonary embolism     2000/09/2008  . Thyroid disease     hypothyroidism  . Allergy history unknown   . GERD (gastroesophageal reflux disease)   . Diabetes mellitus     no insulin  . Asthma   . FH: colonic polyps     adenomataous  . Anticoagulated   . Diarrhea     acute  . Rectal bleeding   . Pedal edema   . Irritable bowel syndrome   . Coronary artery disease   . Paroxysmal atrial fibrillation     normal LV function; episodes occurred in 205 and 09/2007  . Hypothyroidism   . Peripheral edema   . Allergy   .  Atrial fibrillation   . Chronic anticoagulation   . Venous stasis   . Insomnia   . Chronic pain of left knee   . Diarrhea    Medications:  Prescriptions prior to admission  Medication Sig Dispense Refill Last Dose  . acetaminophen (TYLENOL) 500 MG tablet Take 500 mg by mouth daily as needed for headache.   more than a month  . albuterol (PROVENTIL HFA;VENTOLIN HFA) 108 (90 BASE) MCG/ACT inhaler Inhale 2 puffs into the lungs every 6 (six) hours as needed for wheezing or shortness of breath.   Past Week at Unknown time  . albuterol (PROVENTIL) (2.5 MG/3ML) 0.083% nebulizer solution Take 3 mLs (2.5 mg total) by nebulization every 6 (six) hours as needed for wheezing or shortness of breath. 75 mL 5 more than a month  . Cholecalciferol (VITAMIN D-3) 5000 UNITS TABS Take 2 tablets by mouth daily.    05/07/2014 at Unknown time  . citalopram (CELEXA) 40 MG tablet Take 40  mg by mouth daily.   05/07/2014 at Unknown time  . diltiazem (CARDIZEM CD) 300 MG 24 hr capsule Take 300 mg by mouth daily.   05/07/2014 at Unknown time  . diphenoxylate-atropine (LOMOTIL) 2.5-0.025 MG per tablet Take 1 tablet by mouth 2 (two) times daily as needed for diarrhea or loose stools. 60 tablet 5 Past Week at Unknown time  . enoxaparin (LOVENOX) 100 MG/ML injection Use as directed (Patient taking differently: Inject 100 mg into the skin daily. Use as directed) 20 Syringe 0 05/07/2014 at Unknown time  . fluticasone (FLOVENT HFA) 44 MCG/ACT inhaler Inhale 2 puffs into the lungs daily as needed (Shortness of Breath).    more than a month  . levothyroxine (SYNTHROID, LEVOTHROID) 50 MCG tablet Take 50 mcg by mouth daily before breakfast.   05/09/2014 at Unknown time  . montelukast (SINGULAIR) 10 MG tablet Take 10 mg by mouth at bedtime.   05/07/2014 at Unknown time  . ondansetron (ZOFRAN-ODT) 4 MG disintegrating tablet Take 4 mg by mouth every 8 (eight) hours as needed for nausea or vomiting.   Past Week at Unknown time  .  oxycodone (OXY-IR) 5 MG capsule Take 1 capsule (5 mg total) by mouth 4 (four) times daily as needed for pain. 120 capsule 0 Past Week at Unknown time  . pantoprazole (PROTONIX) 40 MG tablet Take 1 tablet (40 mg total) by mouth 2 (two) times daily before a meal. (Patient taking differently: Take 40 mg by mouth 2 (two) times daily. ) 60 tablet 3 05/07/2014 at Unknown time  . pravastatin (PRAVACHOL) 40 MG tablet Take 40 mg by mouth at bedtime.   05/07/2014 at Unknown time  . valsartan (DIOVAN) 320 MG tablet Take 320 mg by mouth daily.   05/07/2014 at Unknown time  . citalopram (CELEXA) 40 MG tablet TAKE 1 TABLET ONCE DAILY. (Patient not taking: Reported on 05/08/2014) 30 tablet 2 Taking  . diltiazem (CARDIZEM CD) 300 MG 24 hr capsule TAKE 1 CAPSULE BY MOUTH ONCE A DAY. (Patient not taking: Reported on 05/08/2014) 30 capsule 6 Taking  . furosemide (LASIX) 40 MG tablet Take 40 mg by mouth daily as needed for fluid.    Taking  . levothyroxine (SYNTHROID, LEVOTHROID) 50 MCG tablet TAKE (1) TABLET BY MOUTH ONCE DAILY. (Patient not taking: Reported on 05/08/2014) 30 tablet 1   . montelukast (SINGULAIR) 10 MG tablet TAKE ONE TABLET BY MOUTH AT BEDTIME. (Patient not taking: Reported on 05/08/2014) 30 tablet 5 Taking  . ondansetron (ZOFRAN-ODT) 4 MG disintegrating tablet TAKE (1) TABLET EVERY EIGHT HOURS AS NEEDED FOR NAUSEA. (Patient not taking: Reported on 05/08/2014) 28 tablet 6 Taking  . peg 3350 powder (MOVIPREP) 100 G SOLR Take 1 kit (200 g total) by mouth once. 1 kit 0 has not started  . potassium chloride SA (K-DUR,KLOR-CON) 20 MEQ tablet Take 20 mEq by mouth daily as needed. Only takes when taking furosemide.     . pravastatin (PRAVACHOL) 40 MG tablet TAKE ONE TABLET BY MOUTH AT BEDTIME. (Patient not taking: Reported on 05/08/2014) 30 tablet 5 Taking  . pregabalin (LYRICA) 75 MG capsule Take 1 capsule (75 mg total) by mouth 2 (two) times daily. (Patient not taking: Reported on 05/08/2014) 60 capsule 5   .  valsartan (DIOVAN) 320 MG tablet TAKE (1) TABLET BY MOUTH ONCE DAILY. (Patient not taking: Reported on 05/08/2014) 30 tablet 0   . warfarin (COUMADIN) 5 MG tablet Take 2.5-5 mg by mouth See admin instructions. Takes 5 mg (1  tablet) on Sunday, Tuesday, Thursday, and Friday Takes 2.5 mg (1/2 tablet) on Monday, Wednesday, and saturday   HOLD   Assessment: 73yo female who presents with melena and anemia.  Pt has been on Coumadin in the past for h/o PE and was on Lovenox as bridge prior to colonoscopy.  Pt has had EGD and colonoscopy and GI has asked to resume Warfarin.  INR is below target.    Goal of Therapy:  INR 2-3 Monitor platelets by anticoagulation protocol: Yes   Plan:   Coumadin 38m today x 1 (home dose)  INR daily  HHart RobinsonsA 05/10/2014,3:23 PM

## 2014-05-11 ENCOUNTER — Encounter (HOSPITAL_COMMUNITY): Payer: Self-pay | Admitting: Internal Medicine

## 2014-05-11 ENCOUNTER — Encounter (HOSPITAL_COMMUNITY): Admission: RE | Payer: Self-pay | Source: Ambulatory Visit

## 2014-05-11 ENCOUNTER — Ambulatory Visit (HOSPITAL_COMMUNITY): Admission: RE | Admit: 2014-05-11 | Payer: Medicare Other | Source: Ambulatory Visit | Admitting: Internal Medicine

## 2014-05-11 DIAGNOSIS — K921 Melena: Secondary | ICD-10-CM | POA: Diagnosis not present

## 2014-05-11 DIAGNOSIS — I10 Essential (primary) hypertension: Secondary | ICD-10-CM | POA: Diagnosis not present

## 2014-05-11 LAB — CBC
HCT: 34.6 % — ABNORMAL LOW (ref 36.0–46.0)
HEMOGLOBIN: 10.8 g/dL — AB (ref 12.0–15.0)
MCH: 26.8 pg (ref 26.0–34.0)
MCHC: 31.2 g/dL (ref 30.0–36.0)
MCV: 85.9 fL (ref 78.0–100.0)
Platelets: 252 10*3/uL (ref 150–400)
RBC: 4.03 MIL/uL (ref 3.87–5.11)
RDW: 15.3 % (ref 11.5–15.5)
WBC: 7.6 10*3/uL (ref 4.0–10.5)

## 2014-05-11 LAB — PROTIME-INR
INR: 1.37 (ref 0.00–1.49)
Prothrombin Time: 17 seconds — ABNORMAL HIGH (ref 11.6–15.2)

## 2014-05-11 SURGERY — COLONOSCOPY
Anesthesia: Moderate Sedation

## 2014-05-11 MED ORDER — ENOXAPARIN SODIUM 150 MG/ML ~~LOC~~ SOLN
150.0000 mg | SUBCUTANEOUS | Status: DC
  Administered 2014-05-11: 150 mg via SUBCUTANEOUS
  Filled 2014-05-11 (×3): qty 1

## 2014-05-11 MED ORDER — ENOXAPARIN SODIUM 150 MG/ML ~~LOC~~ SOLN: 150.0000 mg | SUBCUTANEOUS | Status: DC

## 2014-05-11 MED ORDER — WARFARIN SODIUM 5 MG PO TABS: 5.0000 mg | ORAL_TABLET | Freq: Once | ORAL | Status: DC

## 2014-05-11 NOTE — Discharge Summary (Signed)
Physician Discharge Summary  [name] NO:[id number] DOB: [birth year] DOA: 05/08/2014  PCP: Rubbie Battiest, MD  Admit date: 05/08/2014 Discharge date: 05/11/2014  Time spent: 45 minutes  Recommendations for Outpatient Follow-up:  -Will be discharged home today. -Advised to follow up with PCP in 3 days for INR check and decision on when to DC lovenox.   Discharge Diagnoses:  Principal Problem:   Melena Active Problems:   Essential hypertension   Coronary atherosclerosis   Polymyalgia rheumatica   Microcytic anemia   Long term current use of anticoagulant therapy   UGI bleed   Nausea without vomiting   Discharge Condition: Stable and improved  Filed Weights   05/09/14 0826 05/10/14 0300 05/11/14 0559  Weight: 103.42 kg (228 lb) 106.6 kg (235 lb 0.2 oz) 106.187 kg (234 lb 1.6 oz)    History of present illness:  73 yo female with black stool for one day and mild epigastric pain with nausea for 3 days no vomiting. No fevers. Is currently on lovenox off coumadin for a colonscopy on Wednesday. H/o PE x 2. No swellling. No melena since arrival to ED. Heme positive.  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 -Hb has been stable without transfusions.  H/o PE -Resume coumadin with lovenox bridge. -F/u with PCP in 3 days for INR check.  Hypothyroidism -Continue synthroid.  Procedures: EGD:  Gastric ulcer( May 2015) has completely healed. Two antral erosions.  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 All of these lesions were ablated with argon plasma coagulator. Single small diverticulum at descending colon along with scattered diverticula at sigmoid colon. 4 mm polyp ablated via cold biopsy from sigmoid colon. Random biopsies taken from normal-appearing mucosa of sigmoid colon. Normal rectal  mucosa. Small hemorrhoids below the dentate line.  Consultations:  GI, Dr. Laural Golden  Discharge Instructions  Discharge Instructions    Diet - low sodium heart healthy    Complete by:  As directed      Increase activity slowly    Complete by:  As directed             Medication List    STOP taking these medications        enoxaparin 100 MG/ML injection  Commonly known as:  LOVENOX  Replaced by:  enoxaparin 150 MG/ML injection     furosemide 40 MG tablet  Commonly known as:  LASIX     peg 3350 powder 100 G Solr  Commonly known as:  MOVIPREP     potassium chloride SA 20 MEQ tablet  Commonly known as:  K-DUR,KLOR-CON     pregabalin 75 MG capsule  Commonly known as:  LYRICA      TAKE these medications        acetaminophen 500 MG tablet  Commonly known as:  TYLENOL  Take 500 mg by mouth daily as needed for headache.     albuterol (2.5 MG/3ML) 0.083% nebulizer solution  Commonly known as:  PROVENTIL  Take 3 mLs (2.5 mg total) by nebulization every 6 (six) hours as needed for wheezing or shortness of breath.     albuterol 108 (90 BASE) MCG/ACT inhaler  Commonly known as:  PROVENTIL HFA;VENTOLIN HFA  Inhale 2 puffs into the lungs every 6 (six) hours as needed for wheezing or shortness of breath.     citalopram  40 MG tablet  Commonly known as:  CELEXA  Take 40 mg by mouth daily.     diltiazem 300 MG 24 hr capsule  Commonly known as:  CARDIZEM CD  Take 300 mg by mouth daily.     diphenoxylate-atropine 2.5-0.025 MG per tablet  Commonly known as:  LOMOTIL  Take 1 tablet by mouth 2 (two) times daily as needed for diarrhea or loose stools.     enoxaparin 150 MG/ML injection  Commonly known as:  LOVENOX  Inject 1 mL (150 mg total) into the skin daily.     fluticasone 44 MCG/ACT inhaler  Commonly known as:  FLOVENT HFA  Inhale 2 puffs into the lungs daily as needed (Shortness of Breath).     levothyroxine 50 MCG tablet  Commonly known as:  SYNTHROID, LEVOTHROID    Take 50 mcg by mouth daily before breakfast.     montelukast 10 MG tablet  Commonly known as:  SINGULAIR  Take 10 mg by mouth at bedtime.     ondansetron 4 MG disintegrating tablet  Commonly known as:  ZOFRAN-ODT  Take 4 mg by mouth every 8 (eight) hours as needed for nausea or vomiting.     oxycodone 5 MG capsule  Commonly known as:  OXY-IR  Take 1 capsule (5 mg total) by mouth 4 (four) times daily as needed for pain.     pantoprazole 40 MG tablet  Commonly known as:  PROTONIX  Take 1 tablet (40 mg total) by mouth 2 (two) times daily before a meal.     pravastatin 40 MG tablet  Commonly known as:  PRAVACHOL  Take 40 mg by mouth at bedtime.     valsartan 320 MG tablet  Commonly known as:  DIOVAN  Take 320 mg by mouth daily.     Vitamin D-3 5000 UNITS Tabs  Take 2 tablets by mouth daily.     warfarin 5 MG tablet  Commonly known as:  COUMADIN  - Take 2.5-5 mg by mouth See admin instructions. Takes 5 mg (1 tablet) on Sunday, Tuesday, Thursday, and Friday  - Takes 2.5 mg (1/2 tablet) on Monday, Wednesday, and saturday       Allergies  Allergen Reactions  . Demerol Nausea And Vomiting       Follow-up Information    Follow up with Rubbie Battiest, MD. Schedule an appointment as soon as possible for a visit in 3 days.   Specialty:  Family Medicine   Contact information:   [address] [place] [address] [phone]        The results of significant diagnostics from this hospitalization (including imaging, microbiology, ancillary and laboratory) are listed below for reference.    Significant Diagnostic Studies: No results found.  Microbiology: No results found for this or any previous visit (from the past 240 hour(s)).   Labs: Basic Metabolic Panel:  Recent Labs Lab 05/08/14 1923 05/09/14 0738  NA 143 144  K 3.9 4.1  CL 103 105  CO2 28 28  GLUCOSE 86 84  BUN 10 8  CREATININE 0.81 0.75  CALCIUM 9.4 9.2   Liver Function Tests:  Recent  Labs Lab 05/08/14 2004  AST 15  ALT 7  ALKPHOS 53  BILITOT 0.2*  PROT 6.7  ALBUMIN 3.4*    Recent Labs Lab 05/08/14 2004  LIPASE 27   No results for input(s): AMMONIA in the last 168 hours. CBC:  Recent Labs Lab 05/08/14 1923  05/09/14 0738 05/09/14 1535  05/09/14 2306 05/10/14 0727 05/11/14 0538  WBC 7.3  --   --   --   --   --  7.6  NEUTROABS 4.9  --   --   --   --   --   --   HGB 11.3*  < > 11.2* 10.3* 10.3* 10.4* 10.8*  HCT 35.4*  < > 35.2* 32.7* 32.0* 33.0* 34.6*  MCV 85.1  --   --   --   --   --  85.9  PLT 265  --   --   --   --   --  252  < > = values in this interval not displayed. Cardiac Enzymes: No results for input(s): CKTOTAL, CKMB, CKMBINDEX, TROPONINI in the last 168 hours. BNP: BNP (last 3 results) No results for input(s): PROBNP in the last 8760 hours. CBG: No results for input(s): GLUCAP in the last 168 hours.     SignedLelon Frohlich  [facility] Pager: [phone] 05/11/2014, 1:06 PM

## 2014-05-11 NOTE — Progress Notes (Signed)
Patient with orders to be discharged home. Discharge instructions given, patient verbalized understanding. Lovenox instructions given, patient verbalized understanding. Patient has given herself Lovenox shots in the past. Patient stable. Patient left in private vehicle with family.

## 2014-05-11 NOTE — Progress Notes (Addendum)
ANTICOAGULATION CONSULT NOTE - follow up  Pharmacy Consult for Lovenox >> Coumadin (chronic Rx from home) - Warfarin been on hold for GI work up.  Indication: pulmonary embolus  Allergies  Allergen Reactions  . Demerol Nausea And Vomiting   Patient Measurements: Height: 5' 1"  (154.9 cm) Weight: 234 lb 1.6 oz (106.187 kg) IBW/kg (Calculated) : 47.8  Vital Signs: Temp: 98.3 F (36.8 C) (11/18 0559) Temp Source: Oral (11/18 0559) BP: 170/78 mmHg (11/18 0559) Pulse Rate: 58 (11/18 0559)  Labs:  Recent Labs  05/08/14 1923  05/09/14 0738 05/09/14 1008  05/09/14 2306 05/10/14 0727 05/11/14 0538  HGB 11.3*  < > 11.2*  --   < > 10.3* 10.4* 10.8*  HCT 35.4*  < > 35.2*  --   < > 32.0* 33.0* 34.6*  PLT 265  --   --   --   --   --   --  252  LABPROT  --   < >  --  18.9*  --   --  18.0* 17.0*  INR  --   < >  --  1.57*  --   --  1.47 1.37  CREATININE 0.81  --  0.75  --   --   --   --   --   < > = values in this interval not displayed.  Estimated Creatinine Clearance: 68.3 mL/min (by C-G formula based on Cr of 0.75).  Medical History: Past Medical History  Diagnosis Date  . ASCVD (arteriosclerotic cardiovascular disease)     stent to mid and proximal left anterior descending in 06/2002;drug eluting stent placed in the second diagnol in 08/2003 after  A  non-st elevation myocardial infarction   . Tobacco user     stopped   . Hyperlipidemia     pulmonary embolism 2000 and 09/2008  . Hypertension   . Pulmonary embolism     2000/09/2008  . Thyroid disease     hypothyroidism  . Allergy history unknown   . GERD (gastroesophageal reflux disease)   . Diabetes mellitus     no insulin  . Asthma   . FH: colonic polyps     adenomataous  . Anticoagulated   . Diarrhea     acute  . Rectal bleeding   . Pedal edema   . Irritable bowel syndrome   . Coronary artery disease   . Paroxysmal atrial fibrillation     normal LV function; episodes occurred in 205 and 09/2007  . Hypothyroidism    . Peripheral edema   . Allergy   . Atrial fibrillation   . Chronic anticoagulation   . Venous stasis   . Insomnia   . Chronic pain of left knee   . Diarrhea    Medications:  Prescriptions prior to admission  Medication Sig Dispense Refill Last Dose  . acetaminophen (TYLENOL) 500 MG tablet Take 500 mg by mouth daily as needed for headache.   more than a month  . albuterol (PROVENTIL HFA;VENTOLIN HFA) 108 (90 BASE) MCG/ACT inhaler Inhale 2 puffs into the lungs every 6 (six) hours as needed for wheezing or shortness of breath.   Past Week at Unknown time  . albuterol (PROVENTIL) (2.5 MG/3ML) 0.083% nebulizer solution Take 3 mLs (2.5 mg total) by nebulization every 6 (six) hours as needed for wheezing or shortness of breath. 75 mL 5 more than a month  . Cholecalciferol (VITAMIN D-3) 5000 UNITS TABS Take 2 tablets by mouth daily.    05/07/2014 at  Unknown time  . citalopram (CELEXA) 40 MG tablet Take 40 mg by mouth daily.   05/07/2014 at Unknown time  . diltiazem (CARDIZEM CD) 300 MG 24 hr capsule Take 300 mg by mouth daily.   05/07/2014 at Unknown time  . diphenoxylate-atropine (LOMOTIL) 2.5-0.025 MG per tablet Take 1 tablet by mouth 2 (two) times daily as needed for diarrhea or loose stools. 60 tablet 5 Past Week at Unknown time  . enoxaparin (LOVENOX) 100 MG/ML injection Use as directed (Patient taking differently: Inject 100 mg into the skin daily. Use as directed) 20 Syringe 0 05/07/2014 at Unknown time  . fluticasone (FLOVENT HFA) 44 MCG/ACT inhaler Inhale 2 puffs into the lungs daily as needed (Shortness of Breath).    more than a month  . levothyroxine (SYNTHROID, LEVOTHROID) 50 MCG tablet Take 50 mcg by mouth daily before breakfast.   05/09/2014 at Unknown time  . montelukast (SINGULAIR) 10 MG tablet Take 10 mg by mouth at bedtime.   05/07/2014 at Unknown time  . ondansetron (ZOFRAN-ODT) 4 MG disintegrating tablet Take 4 mg by mouth every 8 (eight) hours as needed for nausea or vomiting.    Past Week at Unknown time  . oxycodone (OXY-IR) 5 MG capsule Take 1 capsule (5 mg total) by mouth 4 (four) times daily as needed for pain. 120 capsule 0 Past Week at Unknown time  . pantoprazole (PROTONIX) 40 MG tablet Take 1 tablet (40 mg total) by mouth 2 (two) times daily before a meal. (Patient taking differently: Take 40 mg by mouth 2 (two) times daily. ) 60 tablet 3 05/07/2014 at Unknown time  . pravastatin (PRAVACHOL) 40 MG tablet Take 40 mg by mouth at bedtime.   05/07/2014 at Unknown time  . valsartan (DIOVAN) 320 MG tablet Take 320 mg by mouth daily.   05/07/2014 at Unknown time  . citalopram (CELEXA) 40 MG tablet TAKE 1 TABLET ONCE DAILY. (Patient not taking: Reported on 05/08/2014) 30 tablet 2 Taking  . diltiazem (CARDIZEM CD) 300 MG 24 hr capsule TAKE 1 CAPSULE BY MOUTH ONCE A DAY. (Patient not taking: Reported on 05/08/2014) 30 capsule 6 Taking  . furosemide (LASIX) 40 MG tablet Take 40 mg by mouth daily as needed for fluid.    Taking  . levothyroxine (SYNTHROID, LEVOTHROID) 50 MCG tablet TAKE (1) TABLET BY MOUTH ONCE DAILY. (Patient not taking: Reported on 05/08/2014) 30 tablet 1   . montelukast (SINGULAIR) 10 MG tablet TAKE ONE TABLET BY MOUTH AT BEDTIME. (Patient not taking: Reported on 05/08/2014) 30 tablet 5 Taking  . ondansetron (ZOFRAN-ODT) 4 MG disintegrating tablet TAKE (1) TABLET EVERY EIGHT HOURS AS NEEDED FOR NAUSEA. (Patient not taking: Reported on 05/08/2014) 28 tablet 6 Taking  . peg 3350 powder (MOVIPREP) 100 G SOLR Take 1 kit (200 g total) by mouth once. 1 kit 0 has not started  . potassium chloride SA (K-DUR,KLOR-CON) 20 MEQ tablet Take 20 mEq by mouth daily as needed. Only takes when taking furosemide.     . pravastatin (PRAVACHOL) 40 MG tablet TAKE ONE TABLET BY MOUTH AT BEDTIME. (Patient not taking: Reported on 05/08/2014) 30 tablet 5 Taking  . pregabalin (LYRICA) 75 MG capsule Take 1 capsule (75 mg total) by mouth 2 (two) times daily. (Patient not taking: Reported  on 05/08/2014) 60 capsule 5   . valsartan (DIOVAN) 320 MG tablet TAKE (1) TABLET BY MOUTH ONCE DAILY. (Patient not taking: Reported on 05/08/2014) 30 tablet 0   . warfarin (COUMADIN) 5 MG tablet Take  2.5-5 mg by mouth See admin instructions. Takes 5 mg (1 tablet) on Sunday, Tuesday, Thursday, and Friday Takes 2.5 mg (1/2 tablet) on Monday, Wednesday, and saturday   HOLD   Assessment: 73yo female who presents with melena and anemia.  Pt has been on Coumadin in the past for h/o PE and PTA was on Lovenox as bridge prior to colonoscopy.  Pt has had EGD and colonoscopy and GI has asked to resume Warfarin.  INR is below target.  D/W Dr Jerilee Hoh and asked to order once daily Lovenox as bridge until INR therapeutic.    Goal of Therapy:  INR 2-3 Monitor platelets by anticoagulation protocol: Yes   Plan:   Coumadin 51m today x 1  Lovenox 1.524mKg SQ q24hrs until INR at goal  INR daily  Monitor CBC  [place] CraneScott A 05/11/2014,10:34 AM

## 2014-05-13 ENCOUNTER — Ambulatory Visit (INDEPENDENT_AMBULATORY_CARE_PROVIDER_SITE_OTHER): Payer: Medicare Other | Admitting: *Deleted

## 2014-05-13 ENCOUNTER — Telehealth: Payer: Self-pay | Admitting: Family Medicine

## 2014-05-13 DIAGNOSIS — Z7901 Long term (current) use of anticoagulants: Secondary | ICD-10-CM

## 2014-05-13 MED ORDER — PROMETHAZINE HCL 25 MG PO TABS: 25.0000 mg | ORAL_TABLET | Freq: Four times a day (QID) | ORAL | Status: DC | PRN

## 2014-05-13 NOTE — Telephone Encounter (Signed)
Phen 25 mg 28 one q 6hrs prn drowsy prec

## 2014-05-13 NOTE — Telephone Encounter (Signed)
Pt having nausea, using the ondansetron (ZOFRAN-ODT) 4 MG disintegrating tablet that she had at home, it's not helping, can we call in something else?  [facility] please deliver Please call pt when done

## 2014-05-13 NOTE — Telephone Encounter (Signed)
Rx sent electronically to pharmacy. Patient notified. 

## 2014-05-14 ENCOUNTER — Other Ambulatory Visit: Payer: Self-pay | Admitting: Adult Health

## 2014-05-17 ENCOUNTER — Encounter: Payer: Self-pay | Admitting: Family Medicine

## 2014-05-17 ENCOUNTER — Ambulatory Visit: Payer: Medicare Other

## 2014-05-17 ENCOUNTER — Ambulatory Visit (INDEPENDENT_AMBULATORY_CARE_PROVIDER_SITE_OTHER): Payer: Medicare Other | Admitting: Family Medicine

## 2014-05-17 VITALS — BP 140/88 | Ht 61.0 in | Wt 225.5 lb

## 2014-05-17 DIAGNOSIS — Z7901 Long term (current) use of anticoagulants: Secondary | ICD-10-CM

## 2014-05-17 DIAGNOSIS — D649 Anemia, unspecified: Secondary | ICD-10-CM | POA: Diagnosis not present

## 2014-05-17 DIAGNOSIS — J4530 Mild persistent asthma, uncomplicated: Secondary | ICD-10-CM

## 2014-05-17 DIAGNOSIS — I10 Essential (primary) hypertension: Secondary | ICD-10-CM | POA: Diagnosis not present

## 2014-05-17 LAB — POCT INR: INR: 1.8

## 2014-05-17 LAB — POCT HEMOGLOBIN: Hemoglobin: 11.2 g/dL — AB (ref 12.2–16.2)

## 2014-05-17 NOTE — Patient Instructions (Signed)
celexa and thyroid and one of your blood pressure pills such as diovan you can take at lunch or in the morning  Please stop the lovenox  For the next month take an iron gluconate tablet one daily plus one multivitamin daily

## 2014-05-17 NOTE — Progress Notes (Signed)
   Subjective:    Patient ID: [name], female    DOB: [birth year], 73 y.o.   MRN: [id number]  HPI Patient states she is here today for a INR follow up visit. Patient is currently taking Lovenox injections and Coumadin. Patient is doing well. INR today is 1.8.  Patient states that she has been suffering with diarrhea for about 2 weeks now. Nausea noted also.   zofran no helping nausea now  Diarrhea has impr somewhat   Patient notes ongoing fatigue. Frustrated by multiple medications that she is on.  Hospital chart reviewed. Patient had both upper endoscopy and lower endoscopy.  The patient experienced exacerbation of anemia. Currently not on iron supplement.   Review of Systems No headache no chest pain no back pain no abdominal pain no rash    Objective:   Physical Exam Alert no apparent distress mild malaise. HEENT normal. Lungs clear. Heart regular in rhythm. Abdomen benign.  Results for orders placed or performed in visit on 05/17/14  POCT INR  Result Value Ref Range   INR 1.8   POCT hemoglobin  Result Value Ref Range   Hemoglobin 11.2 (A) 12.2 - 16.2 g/dL          Assessment & Plan:  Impression 1 anticoagulation back up to near goal may stop Lovenox discussed #2 anemia hemoglobin improved discussed #3 diarrhea likely irritable bowel syndrome discussed #4 ongoing fatigue very long standing. Patient has approximate 10 medical diagnoses which all can contribute to fatigue discussed plan follow-up as scheduled. Iron supplement over next month. Along with multivitamin supplement. Adjustment of dosage in discussed to decrease amount of nighttime medications. WSL

## 2014-05-27 ENCOUNTER — Other Ambulatory Visit: Payer: Self-pay | Admitting: Adult Health

## 2014-05-27 ENCOUNTER — Other Ambulatory Visit: Payer: Self-pay | Admitting: Family Medicine

## 2014-06-02 ENCOUNTER — Ambulatory Visit: Payer: Medicare Other | Admitting: Cardiovascular Disease

## 2014-06-09 ENCOUNTER — Ambulatory Visit (INDEPENDENT_AMBULATORY_CARE_PROVIDER_SITE_OTHER): Payer: Medicare Other | Admitting: Family Medicine

## 2014-06-09 ENCOUNTER — Encounter: Payer: Self-pay | Admitting: Family Medicine

## 2014-06-09 VITALS — BP 124/80 | Ht 61.0 in | Wt 218.0 lb

## 2014-06-09 DIAGNOSIS — E785 Hyperlipidemia, unspecified: Secondary | ICD-10-CM

## 2014-06-09 DIAGNOSIS — D649 Anemia, unspecified: Secondary | ICD-10-CM

## 2014-06-09 DIAGNOSIS — K219 Gastro-esophageal reflux disease without esophagitis: Secondary | ICD-10-CM | POA: Diagnosis not present

## 2014-06-09 DIAGNOSIS — Z7901 Long term (current) use of anticoagulants: Secondary | ICD-10-CM

## 2014-06-09 DIAGNOSIS — R5382 Chronic fatigue, unspecified: Secondary | ICD-10-CM | POA: Diagnosis not present

## 2014-06-09 DIAGNOSIS — J4541 Moderate persistent asthma with (acute) exacerbation: Secondary | ICD-10-CM | POA: Diagnosis not present

## 2014-06-09 DIAGNOSIS — I10 Essential (primary) hypertension: Secondary | ICD-10-CM | POA: Diagnosis not present

## 2014-06-09 LAB — POCT HEMOGLOBIN: HEMOGLOBIN: 10.5 g/dL — AB (ref 12.2–16.2)

## 2014-06-09 LAB — POCT INR: INR: 2.3

## 2014-06-09 MED ORDER — ESCITALOPRAM OXALATE 20 MG PO TABS: 20.0000 mg | ORAL_TABLET | Freq: Every day | ORAL | Status: DC

## 2014-06-09 NOTE — Progress Notes (Signed)
   Subjective:    Patient ID: [name], female    DOB: October 21, 1940, 73 y.o.   MRN: [id number]  [place]. Started in November. Pt wants to discuss getting vit b12 shots. Pt states she does not want to take iron. Hb today was 10.5. Iron tablets cause symptoms for this patient   Patient notes ongoing fatigue and tiredness. She has already had a GI workup.  Results for orders placed or performed in visit on 06/09/14  POCT INR  Result Value Ref Range   INR 2.3   POCT hemoglobin  Result Value Ref Range   Hemoglobin 10.5 (A) 12.2 - 16.2 g/dL   INR today was 2.3 .  Depression, weight loss, no appetitie.  Taking celexa 40mg  qd. Admits to depression worsening. Claims compliance meds. Realizes often things get worse during this time of year.  Wheezing at night. Started about 1 week ago. Using inhaler. No productive cough. Review of Systems no chest pain no headache    no change in bowel habits no blood in stool ROS otherwise negative  Objective:   Physical Exam   alert no acute distress. Moderate malaise. HEENT normal. Lungs clear. Heart regular in rhythm. Ankles without edema.       Assessment & Plan:  Impression chronic fatigue. Likely most related to #2 please see prior notes discussed at great length last visit #2 depression suboptimal #3 anemia discussed #4 asthma somewhat flared up at this point #5 hypothyroidism exact status uncertain plan appropriate blood work. Exercise encourage. Plan maintain same Coumadin dose. Diet exercise discussed. B-12 at patient's request. TSH.

## 2014-06-15 ENCOUNTER — Ambulatory Visit: Payer: Medicare Other

## 2014-06-20 ENCOUNTER — Other Ambulatory Visit: Payer: Self-pay | Admitting: Family Medicine

## 2014-06-28 ENCOUNTER — Telehealth (INDEPENDENT_AMBULATORY_CARE_PROVIDER_SITE_OTHER): Payer: Self-pay | Admitting: *Deleted

## 2014-06-28 NOTE — Telephone Encounter (Signed)
Start Imodium BID. One in am and one in the pm.  Call me Friday.

## 2014-06-28 NOTE — Telephone Encounter (Signed)
[name] has had diarrhea for 4 days. The medications is not helping her. Please return the call at [phone].

## 2014-06-30 ENCOUNTER — Ambulatory Visit: Payer: Medicare Other | Admitting: Cardiovascular Disease

## 2014-07-07 ENCOUNTER — Ambulatory Visit: Payer: Medicare Other

## 2014-07-08 ENCOUNTER — Ambulatory Visit: Payer: Medicare Other

## 2014-07-12 ENCOUNTER — Ambulatory Visit (INDEPENDENT_AMBULATORY_CARE_PROVIDER_SITE_OTHER): Payer: Medicare Other | Admitting: *Deleted

## 2014-07-12 DIAGNOSIS — Z7901 Long term (current) use of anticoagulants: Secondary | ICD-10-CM | POA: Diagnosis not present

## 2014-07-12 LAB — POCT INR: INR: 3.9

## 2014-07-12 NOTE — Patient Instructions (Signed)
Coumadin - take one half tablet on Monday, Wednesday, Friday, and Saturday. And take one tablet on Sunday, Tuesday, and Thursday.  Recheck in 2 weeks.

## 2014-07-15 ENCOUNTER — Ambulatory Visit: Payer: Medicare Other

## 2014-07-26 ENCOUNTER — Ambulatory Visit (INDEPENDENT_AMBULATORY_CARE_PROVIDER_SITE_OTHER): Payer: Medicare Other | Admitting: Family Medicine

## 2014-07-26 ENCOUNTER — Encounter: Payer: Self-pay | Admitting: Family Medicine

## 2014-07-26 VITALS — BP 124/76 | Temp 99.0°F | Wt 216.0 lb

## 2014-07-26 DIAGNOSIS — J329 Chronic sinusitis, unspecified: Secondary | ICD-10-CM

## 2014-07-26 DIAGNOSIS — Z7901 Long term (current) use of anticoagulants: Secondary | ICD-10-CM

## 2014-07-26 DIAGNOSIS — J310 Chronic rhinitis: Secondary | ICD-10-CM

## 2014-07-26 DIAGNOSIS — J4531 Mild persistent asthma with (acute) exacerbation: Secondary | ICD-10-CM

## 2014-07-26 LAB — POCT INR: INR: 2.6

## 2014-07-26 MED ORDER — PREDNISONE 20 MG PO TABS: ORAL_TABLET | ORAL | Status: DC

## 2014-07-26 MED ORDER — AMOXICILLIN-POT CLAVULANATE 875-125 MG PO TABS: 1.0000 | ORAL_TABLET | Freq: Two times a day (BID) | ORAL | Status: AC

## 2014-07-26 MED ORDER — ALBUTEROL SULFATE (2.5 MG/3ML) 0.083% IN NEBU: 2.5000 mg | INHALATION_SOLUTION | Freq: Four times a day (QID) | RESPIRATORY_TRACT | Status: DC | PRN

## 2014-07-26 NOTE — Progress Notes (Signed)
   Subjective:    Patient ID: [name], female    DOB: 1940-08-03, 74 y.o.   MRN: [id number]  Shortness of Breath This is a new problem. The current episode started 1 to 4 weeks ago. Associated symptoms include ear pain, a fever and headaches. Associated symptoms comments: Swelling in throat. Treatments tried: proair inhaler, neb treatments.   INR today 2.6.   Cough wheezy and productive   Fever off and on  Headache frontal  No vom   Review of Systems  Constitutional: Positive for fever.  HENT: Positive for ear pain.   Respiratory: Positive for shortness of breath.   Neurological: Positive for headaches.       Objective:   Physical Exam Alert moderate malaise vital stable HET moderate nasal congestion frontal tenderness pharynx normal lungs bilateral wheezes no tachypnea heart regular in rhythm.       Assessment & Plan:  Impression rhinosinusitis bronchitis with exacerbation of asthma plan prednisone taper. Antibiotics prescribed. Albuterol via nebulizer or metered-dose inhaler. Recheck INR in 1 week with antibiotic use WSL

## 2014-07-27 ENCOUNTER — Telehealth: Payer: Self-pay | Admitting: Family Medicine

## 2014-07-27 NOTE — Telephone Encounter (Signed)
Call her on [phone]

## 2014-07-27 NOTE — Telephone Encounter (Signed)
Stay on ssame dose as she was on and re ck next wk as rec--adjust our sheet to reflect

## 2014-07-27 NOTE — Telephone Encounter (Signed)
Pt called stating that the instructions on her Coumadin said for her to  Take 5mg  everyday. Pt stated that she normally takes 1/2 on Tues and Fri and a  Whole one the rest of the days. Pt needs clarification on what she is suppose to take.

## 2014-07-28 ENCOUNTER — Other Ambulatory Visit: Payer: Self-pay | Admitting: Adult Health

## 2014-07-28 NOTE — Telephone Encounter (Signed)
Pt notified and verbalized understanding.

## 2014-08-03 ENCOUNTER — Ambulatory Visit (INDEPENDENT_AMBULATORY_CARE_PROVIDER_SITE_OTHER): Payer: Medicare Other | Admitting: *Deleted

## 2014-08-03 ENCOUNTER — Telehealth: Payer: Self-pay | Admitting: Family Medicine

## 2014-08-03 DIAGNOSIS — Z7901 Long term (current) use of anticoagulants: Secondary | ICD-10-CM

## 2014-08-03 LAB — POCT INR: INR: 3.1

## 2014-08-03 MED ORDER — OXYCODONE HCL 5 MG PO CAPS
5.0000 mg | ORAL_CAPSULE | Freq: Four times a day (QID) | ORAL | Status: DC | PRN
Start: 2014-08-03 — End: 2014-08-24

## 2014-08-03 NOTE — Telephone Encounter (Signed)
Rx printed and given to patient at Nurse Visit.

## 2014-08-03 NOTE — Telephone Encounter (Signed)
Ok if time

## 2014-08-03 NOTE — Telephone Encounter (Signed)
oxycodone (OXY-IR) 5 MG capsule    Pt coming in today after lunch, states she needs this med refilled Can we have it ready for her then?

## 2014-08-08 ENCOUNTER — Other Ambulatory Visit: Payer: Self-pay | Admitting: Adult Health

## 2014-08-08 ENCOUNTER — Other Ambulatory Visit: Payer: Self-pay | Admitting: Family Medicine

## 2014-08-09 NOTE — Telephone Encounter (Signed)
Last seen 07/26/14 (sick)

## 2014-08-10 ENCOUNTER — Other Ambulatory Visit: Payer: Self-pay | Admitting: Adult Health

## 2014-08-10 MED ORDER — DILTIAZEM HCL ER COATED BEADS 300 MG PO CP24: ORAL_CAPSULE | ORAL | Status: DC

## 2014-08-10 NOTE — Telephone Encounter (Signed)
Needs refill on Cardiazem sent to [facility] / [facility]

## 2014-08-11 NOTE — Telephone Encounter (Signed)
[facility] for both or generic equiv for 6 months worth

## 2014-08-16 ENCOUNTER — Other Ambulatory Visit: Payer: Self-pay | Admitting: *Deleted

## 2014-08-16 MED ORDER — DIPHENOXYLATE-ATROPINE 2.5-0.025 MG PO TABS: ORAL_TABLET | ORAL | Status: DC

## 2014-08-23 ENCOUNTER — Ambulatory Visit (INDEPENDENT_AMBULATORY_CARE_PROVIDER_SITE_OTHER): Payer: Medicare Other | Admitting: Internal Medicine

## 2014-08-23 ENCOUNTER — Encounter (INDEPENDENT_AMBULATORY_CARE_PROVIDER_SITE_OTHER): Payer: Self-pay | Admitting: Internal Medicine

## 2014-08-23 VITALS — BP 116/76 | HR 78 | Temp 97.1°F | Resp 18 | Ht 61.0 in | Wt 213.5 lb

## 2014-08-23 DIAGNOSIS — K219 Gastro-esophageal reflux disease without esophagitis: Secondary | ICD-10-CM | POA: Diagnosis not present

## 2014-08-23 DIAGNOSIS — D500 Iron deficiency anemia secondary to blood loss (chronic): Secondary | ICD-10-CM | POA: Diagnosis not present

## 2014-08-23 DIAGNOSIS — K589 Irritable bowel syndrome without diarrhea: Secondary | ICD-10-CM | POA: Diagnosis not present

## 2014-08-23 MED ORDER — DIPHENOXYLATE-ATROPINE 2.5-0.025 MG PO TABS: ORAL_TABLET | ORAL | Status: DC

## 2014-08-23 MED ORDER — RIFAXIMIN 550 MG PO TABS: 550.0000 mg | ORAL_TABLET | Freq: Three times a day (TID) | ORAL | Status: DC

## 2014-08-23 NOTE — Patient Instructions (Signed)
CBC and INR to be done on 08/29/2014. Please call office with progress report one week after finishing Xifaxan.

## 2014-08-23 NOTE — Progress Notes (Signed)
Presenting complaint;  Follow-up for irregular bowel movements and anemia secondary to GI bleed.  Subjective:  Patient is 74 year old Caucasian female who is here for scheduled visit. While her last office visit on was on 03/15/2014 she was seen in the hospital in November 2015 when she was admitted with GI bleed anemia and underwent EGD with colonoscopy. EGD revealed 2 antral erosions and healed gastric ulcer(found on EGD of May 2015). Colonoscopy revealed right-sided AV malformation but stigmata of bleeding and these were ablated with APC she had small polyp removed which was adenomatous in the biopsies from sigmoid colon were negative for microscopic or lymphocytic colitis.  Patient continues to complain of diarrhea. She states she does not like to go out. She has diarrhea at least 3 times a week. When she has diarrhea she has anywhere from 3-6 stools. She is using diphenoxylate on as-needed basis. When she doesn't have diarrhea she either has normal stool or she is constipated. She has gone as many as 7 days without a bowel movement. She says she has accidents every week when she is not able to make the bathroom. She denies melena or rectal bleeding abdominal pain. She is on oxycodone for fibromyalgia. She states pantoprazole is working and she lies dysphagia nausea or vomiting. She states she has good appetite but then she has diarrhea she does not eat. She has lost 17 pounds since her last visit of 03/15/2014.   Current Medications: Outpatient Encounter Prescriptions as of 08/23/2014  Medication Sig  . acetaminophen (TYLENOL) 500 MG tablet Take 500 mg by mouth daily as needed for headache.  . albuterol (PROVENTIL) (2.5 MG/3ML) 0.083% nebulizer solution Take 3 mLs (2.5 mg total) by nebulization every 6 (six) hours as needed for wheezing or shortness of breath.  . Cholecalciferol (VITAMIN D-3) 5000 UNITS TABS Take 2 tablets by mouth daily.   Marland Kitchen diltiazem (CARDIZEM CD) 300 MG 24 hr capsule TAKE  1 CAPSULE BY MOUTH ONCE A DAY.  . diphenoxylate-atropine (LOMOTIL) 2.5-0.025 MG per tablet TAKE 1 TABLET TWICE DAILY AS NEEDED FOR DIARRHEA OR LOOSE STOOLS.  Marland Kitchen escitalopram (LEXAPRO) 20 MG tablet Take 1 tablet (20 mg total) by mouth daily.  . fluticasone (FLOVENT HFA) 44 MCG/ACT inhaler Inhale 2 puffs into the lungs daily as needed (Shortness of Breath).   Marland Kitchen levothyroxine (SYNTHROID, LEVOTHROID) 50 MCG tablet TAKE (1) TABLET BY MOUTH ONCE DAILY.  . montelukast (SINGULAIR) 10 MG tablet TAKE ONE TABLET BY MOUTH AT BEDTIME.  Marland Kitchen ondansetron (ZOFRAN-ODT) 4 MG disintegrating tablet Take 4 mg by mouth every 8 (eight) hours as needed for nausea or vomiting.  Marland Kitchen oxycodone (OXY-IR) 5 MG capsule Take 1 capsule (5 mg total) by mouth 4 (four) times daily as needed for pain.  . pantoprazole (PROTONIX) 40 MG tablet Take 1 tablet (40 mg total) by mouth 2 (two) times daily before a meal. (Patient taking differently: Take 40 mg by mouth 2 (two) times daily. )  . pravastatin (PRAVACHOL) 40 MG tablet Take 40 mg by mouth at bedtime.  Marland Kitchen PROAIR HFA 108 (90 BASE) MCG/ACT inhaler INHALE 2 PUFFS UP TO FOUR TIMES DAILY AS NEEDED FOR WHEEZING.  . promethazine (PHENERGAN) 25 MG tablet Take 1 tablet (25 mg total) by mouth every 6 (six) hours as needed for nausea or vomiting.  . valsartan (DIOVAN) 320 MG tablet TAKE (1) TABLET BY MOUTH ONCE DAILY.  Marland Kitchen warfarin (COUMADIN) 5 MG tablet TAKE 1/2 TABLET ON TUES AND FRI AND 1 TABLET ALL OTHER DAYS. (Patient  taking differently: Takes 5 mg on Sunday, Tuesday , Thursday , all other days she takes 1/2 tablet)  . [DISCONTINUED] diltiazem (CARDIZEM CD) 300 MG 24 hr capsule TAKE 1 CAPSULE BY MOUTH ONCE A DAY. (Patient not taking: Reported on 08/23/2014)  . [DISCONTINUED] predniSONE (DELTASONE) 20 MG tablet Three qd for three d two qd for three d one qd for tqwo d (Patient not taking: Reported on 08/23/2014)     Objective: Blood pressure 116/76, pulse 78, temperature 97.1 F (36.2 C), temperature  source Oral, resp. rate 18, height 5\' 1"  (1.549 m), weight 213 lb 8 oz (96.843 kg). Patient is alert and in no acute distress. Conjunctiva is pink. Sclera is nonicteric Oropharyngeal mucosa is normal. She has palatal scar and absent uvula. No neck masses or thyromegaly noted. Cardiac exam with regular rhythm normal S1 and S2. Faint systolic ejection murmur noted at LLSB and aortic area. Lungs are clear to auscultation. Abdomen is full. Bowel sounds are normal. On palpation abdomen is soft and nontender without organomegaly or masses.  No LE edema or clubbing noted.  Labs/studies Results: Hemoglobin 10.5 on 06/09/2014  Hemoglobin 11.2 on 05/17/2014  Hemoglobin 10.8 on 05/11/2014    Assessment:  #1. Irritable bowel syndrome. She has typical symptoms. She has both diarrhea and constipation. Recent colonoscopy revealed no evidence of endoscopic or microscopic colitis. Lately she's had more diarrhea than constipation. #2. History of anemia secondary to GI bleed. She underwent EGD and colonoscopy November 2015 with therapeutic intervention as above. #3. Chronic GERD. Symptoms well controlled with PPI. #4. Weight loss appears to be due to poor intake in order to decrease stool frequency.   Plan:  Xifaxan 550 mg by mouth twice a day for two weeks. Patient will have CBC and INR on 08/29/2014. Patient advised to call office for progress report 1 week after finishing Xifaxan. Office visit in 3 months.

## 2014-08-24 ENCOUNTER — Ambulatory Visit (INDEPENDENT_AMBULATORY_CARE_PROVIDER_SITE_OTHER): Payer: Medicare Other | Admitting: Family Medicine

## 2014-08-24 ENCOUNTER — Encounter: Payer: Self-pay | Admitting: Family Medicine

## 2014-08-24 VITALS — BP 132/76 | Ht 61.0 in | Wt 216.0 lb

## 2014-08-24 DIAGNOSIS — Z7901 Long term (current) use of anticoagulants: Secondary | ICD-10-CM

## 2014-08-24 DIAGNOSIS — Z79899 Other long term (current) drug therapy: Secondary | ICD-10-CM | POA: Diagnosis not present

## 2014-08-24 DIAGNOSIS — D509 Iron deficiency anemia, unspecified: Secondary | ICD-10-CM

## 2014-08-24 DIAGNOSIS — I10 Essential (primary) hypertension: Secondary | ICD-10-CM

## 2014-08-24 DIAGNOSIS — J4541 Moderate persistent asthma with (acute) exacerbation: Secondary | ICD-10-CM | POA: Diagnosis not present

## 2014-08-24 DIAGNOSIS — E785 Hyperlipidemia, unspecified: Secondary | ICD-10-CM

## 2014-08-24 DIAGNOSIS — E119 Type 2 diabetes mellitus without complications: Secondary | ICD-10-CM

## 2014-08-24 DIAGNOSIS — R5383 Other fatigue: Secondary | ICD-10-CM | POA: Diagnosis not present

## 2014-08-24 MED ORDER — OXYCODONE HCL 5 MG PO CAPS: 5.0000 mg | ORAL_CAPSULE | Freq: Four times a day (QID) | ORAL | Status: DC | PRN

## 2014-08-24 NOTE — Patient Instructions (Signed)

## 2014-08-24 NOTE — Progress Notes (Signed)
   Subjective:    Patient ID: [name], female    DOB: 1941-05-29, 74 y.o.   MRN: [id number]  HPI  This patient was seen today for chronic pain  The medication list was reviewed and updated.  INR 2.7   -Compliance with pain medication: yes  The patient was advised the importance of maintaining medication and not using illegal substances with these.  Refills needed: yes  The patient was educated that we can provide 3 monthly scripts for their medication, it is their responsibility to follow the instructions.  Side effects or complications from medications: none  Patient is aware that pain medications are meant to minimize the severity of the pain to allow their pain levels to improve to allow for better function. They are aware of that pain medications cannot totally remove their pain.  Due for UDT ( at least once per year) :   Patient notes compliance with Coumadin. Has not missed above dose. No bleeding tendencies.   Compliant with blood pressure medicine. No obvious side effects watching salt intake.  Reports that sugar intake is in good control. Watching her diet in that regard. Compliant with medicines. No sugars under 100.  Did not get blood work is ordered in December for anemia       Review of Systems No headache chronic back pain no chest pain no abdominal pain no change in bowel habits    Objective:   Physical Exam  Alert no acute distress vital stable HET normal lungs clear heart regular in rhythm ankles without edema      Assessment & Plan:  Impression #1 chronic pain discussed #2 anticoagulation stable maintain same #3 type 2 diabetes exact control uncertain #4 anemia status uncertain plan appropriate blood work. Maintain Coumadin. Pain medicine refilled. Recheck in several months. Diet exercise discussed. WSL

## 2014-08-25 ENCOUNTER — Other Ambulatory Visit (INDEPENDENT_AMBULATORY_CARE_PROVIDER_SITE_OTHER): Payer: Self-pay | Admitting: *Deleted

## 2014-08-25 ENCOUNTER — Encounter (INDEPENDENT_AMBULATORY_CARE_PROVIDER_SITE_OTHER): Payer: Self-pay | Admitting: *Deleted

## 2014-08-25 DIAGNOSIS — D500 Iron deficiency anemia secondary to blood loss (chronic): Secondary | ICD-10-CM

## 2014-08-31 ENCOUNTER — Telehealth (INDEPENDENT_AMBULATORY_CARE_PROVIDER_SITE_OTHER): Payer: Self-pay | Admitting: *Deleted

## 2014-08-31 NOTE — Telephone Encounter (Signed)
[name] said Dr. Laural Golden put her on Xifaxan 50 mg, three times daily. She is having headaches and feeling drunk. Would like to know if it would be okay to stop the medication for one week then go back on it. Her return phone number is [phone].

## 2014-09-02 ENCOUNTER — Other Ambulatory Visit: Payer: Self-pay | Admitting: Family Medicine

## 2014-09-06 NOTE — Telephone Encounter (Signed)
Try taking one Xifaxan a day and call me and let me know how you are doing.

## 2014-09-07 ENCOUNTER — Encounter: Payer: Self-pay | Admitting: *Deleted

## 2014-09-07 ENCOUNTER — Ambulatory Visit: Payer: Medicare Other | Admitting: Cardiovascular Disease

## 2014-09-23 ENCOUNTER — Ambulatory Visit (INDEPENDENT_AMBULATORY_CARE_PROVIDER_SITE_OTHER): Payer: Medicare Other | Admitting: *Deleted

## 2014-09-23 DIAGNOSIS — E119 Type 2 diabetes mellitus without complications: Secondary | ICD-10-CM | POA: Diagnosis not present

## 2014-09-23 DIAGNOSIS — Z7901 Long term (current) use of anticoagulants: Secondary | ICD-10-CM | POA: Diagnosis not present

## 2014-09-23 DIAGNOSIS — Z79899 Other long term (current) drug therapy: Secondary | ICD-10-CM | POA: Diagnosis not present

## 2014-09-23 DIAGNOSIS — E785 Hyperlipidemia, unspecified: Secondary | ICD-10-CM | POA: Diagnosis not present

## 2014-09-23 DIAGNOSIS — R5383 Other fatigue: Secondary | ICD-10-CM | POA: Diagnosis not present

## 2014-09-23 LAB — POCT INR: INR: 2.3

## 2014-09-23 NOTE — Patient Instructions (Signed)
Take half tablet on Monday, Wed, Fri, and Sat and one tablet all other days. Recheck in 4 weeks.

## 2014-09-26 LAB — CBC WITH DIFFERENTIAL/PLATELET
BASOS: 0 %
Basophils Absolute: 0 10*3/uL (ref 0.0–0.2)
EOS ABS: 0.3 10*3/uL (ref 0.0–0.4)
Eos: 3 %
HCT: 39.9 % (ref 34.0–46.6)
Hemoglobin: 12.6 g/dL (ref 11.1–15.9)
IMMATURE GRANS (ABS): 0 10*3/uL (ref 0.0–0.1)
Immature Granulocytes: 0 %
LYMPHS ABS: 2.5 10*3/uL (ref 0.7–3.1)
Lymphs: 27 %
MCH: 26.1 pg — ABNORMAL LOW (ref 26.6–33.0)
MCHC: 31.6 g/dL (ref 31.5–35.7)
MCV: 83 fL (ref 79–97)
MONOS ABS: 0.6 10*3/uL (ref 0.1–0.9)
Monocytes: 7 %
Neutrophils Absolute: 5.6 10*3/uL (ref 1.4–7.0)
Neutrophils Relative %: 63 %
Platelets: 333 10*3/uL (ref 150–379)
RBC: 4.82 x10E6/uL (ref 3.77–5.28)
RDW: 16.9 % — ABNORMAL HIGH (ref 12.3–15.4)
WBC: 8.9 10*3/uL (ref 3.4–10.8)

## 2014-09-26 LAB — LIPID PANEL
CHOLESTEROL TOTAL: 189 mg/dL (ref 100–199)
Chol/HDL Ratio: 3.5 ratio units (ref 0.0–4.4)
HDL: 54 mg/dL (ref >39)
LDL Calculated: 106 mg/dL — ABNORMAL HIGH (ref 0–99)
Triglycerides: 143 mg/dL (ref 0–149)
VLDL CHOLESTEROL CAL: 29 mg/dL (ref 5–40)

## 2014-09-26 LAB — HEMOGLOBIN A1C
Est. average glucose Bld gHb Est-mCnc: 120 mg/dL
Hgb A1c MFr Bld: 5.8 % — ABNORMAL HIGH (ref 4.8–5.6)

## 2014-09-26 LAB — HEPATIC FUNCTION PANEL
ALT: 7 IU/L (ref 0–32)
AST: 15 IU/L (ref 0–40)
Albumin: 4.1 g/dL (ref 3.5–4.8)
Alkaline Phosphatase: 66 IU/L (ref 39–117)
BILIRUBIN TOTAL: 0.3 mg/dL (ref 0.0–1.2)
BILIRUBIN, DIRECT: 0.09 mg/dL (ref 0.00–0.40)
Total Protein: 6.4 g/dL (ref 6.0–8.5)

## 2014-09-26 LAB — VITAMIN B12: Vitamin B-12: 516 pg/mL (ref 211–946)

## 2014-09-26 LAB — TSH: TSH: 1.67 u[IU]/mL (ref 0.450–4.500)

## 2014-10-06 ENCOUNTER — Other Ambulatory Visit: Payer: Self-pay | Admitting: Family Medicine

## 2014-10-07 ENCOUNTER — Encounter: Payer: Self-pay | Admitting: *Deleted

## 2014-10-07 ENCOUNTER — Ambulatory Visit: Payer: Medicare Other | Admitting: Cardiovascular Disease

## 2014-10-07 ENCOUNTER — Encounter: Payer: Self-pay | Admitting: Cardiovascular Disease

## 2014-10-07 ENCOUNTER — Ambulatory Visit (INDEPENDENT_AMBULATORY_CARE_PROVIDER_SITE_OTHER): Payer: Medicare Other | Admitting: Cardiovascular Disease

## 2014-10-07 VITALS — BP 126/88 | HR 41 | Ht 61.0 in | Wt 207.0 lb

## 2014-10-07 DIAGNOSIS — I2699 Other pulmonary embolism without acute cor pulmonale: Secondary | ICD-10-CM

## 2014-10-07 DIAGNOSIS — R5383 Other fatigue: Secondary | ICD-10-CM | POA: Diagnosis not present

## 2014-10-07 DIAGNOSIS — I10 Essential (primary) hypertension: Secondary | ICD-10-CM | POA: Diagnosis not present

## 2014-10-07 DIAGNOSIS — I25118 Atherosclerotic heart disease of native coronary artery with other forms of angina pectoris: Secondary | ICD-10-CM

## 2014-10-07 DIAGNOSIS — R0602 Shortness of breath: Secondary | ICD-10-CM | POA: Diagnosis not present

## 2014-10-07 DIAGNOSIS — I480 Paroxysmal atrial fibrillation: Secondary | ICD-10-CM

## 2014-10-07 MED ORDER — DILTIAZEM HCL ER COATED BEADS 180 MG PO CP24: 180.0000 mg | ORAL_CAPSULE | Freq: Every day | ORAL | Status: DC

## 2014-10-07 NOTE — Patient Instructions (Signed)
Your physician recommends that you schedule a follow-up appointment in:  6 weeks with Dr. Bronson Ing  Your physician has recommended you make the following change in your medication:     Decrease Diltiazem to 180 mg Daily   Your physician has requested that you have a lexiscan myoview. For further information please visit [email or web address]. Please follow instruction sheet, as given.  Your physician has requested that you have an echocardiogram. Echocardiography is a painless test that uses sound waves to create images of your heart. It provides your doctor with information about the size and shape of your heart and how well your heart's chambers and valves are working. This procedure takes approximately one hour. There are no restrictions for this procedure.  Thank you for choosing [facility]!

## 2014-10-07 NOTE — Progress Notes (Signed)
Patient ID: Tashena AIJALON GREENHILL, female   DOB: 1941-05-20, 74 y.o.   MRN: [id number]      SUBJECTIVE: The patient is a 74 year old woman who I am meeting for the first time today. Her past medical history is significant for coronary artery disease and has had multiple percutaneous coronary interventions and has a history of a non-STEMI. She also has a history of hypertension, pulmonary embolism, and paroxysmal atrial fibrillation.  She denies chest pain. She said when she lies down at night she can't "get enough breath in my lungs". She has been sleeping with 2 pillows to breathe more comfortably. She denies palpitations and dizziness but has felt remarkably fatigued. She denies worsening of her baseline exertional dyspnea and also denies leg swelling. She did have exertional dyspnea which was profound prior to undergoing coronary artery stent placement.  Resting heart rate today is between 56-60 bpm.   Review of Systems: As per "subjective", otherwise negative.  Allergies  Allergen Reactions  . Demerol Nausea And Vomiting    Current Outpatient Prescriptions  Medication Sig Dispense Refill  . acetaminophen (TYLENOL) 500 MG tablet Take 500 mg by mouth daily as needed for headache.    . albuterol (PROVENTIL) (2.5 MG/3ML) 0.083% nebulizer solution Take 3 mLs (2.5 mg total) by nebulization every 6 (six) hours as needed for wheezing or shortness of breath. 75 mL 5  . Cholecalciferol (VITAMIN D-3) 5000 UNITS TABS Take 2 tablets by mouth daily.     Marland Kitchen diltiazem (CARDIZEM CD) 300 MG 24 hr capsule TAKE 1 CAPSULE BY MOUTH ONCE A DAY. 30 capsule 5  . diphenoxylate-atropine (LOMOTIL) 2.5-0.025 MG per tablet TAKE 1 TABLET TWICE DAILY AS NEEDED FOR DIARRHEA OR LOOSE STOOLS. 60 tablet 2  . escitalopram (LEXAPRO) 20 MG tablet Take 1 tablet (20 mg total) by mouth daily. 30 tablet 5  . fluticasone (FLOVENT HFA) 44 MCG/ACT inhaler Inhale 2 puffs into the lungs daily as needed (Shortness of Breath).     Marland Kitchen  levothyroxine (SYNTHROID, LEVOTHROID) 50 MCG tablet TAKE (1) TABLET BY MOUTH ONCE DAILY. 30 tablet 5  . ondansetron (ZOFRAN-ODT) 4 MG disintegrating tablet Take 4 mg by mouth every 8 (eight) hours as needed for nausea or vomiting.    Marland Kitchen oxycodone (OXY-IR) 5 MG capsule Take 1 capsule (5 mg total) by mouth 4 (four) times daily as needed for pain. 120 capsule 0  . pantoprazole (PROTONIX) 40 MG tablet Take 1 tablet (40 mg total) by mouth 2 (two) times daily before a meal. (Patient taking differently: Take 40 mg by mouth 2 (two) times daily. ) 60 tablet 3  . pravastatin (PRAVACHOL) 40 MG tablet TAKE ONE TABLET BY MOUTH AT BEDTIME. 30 tablet 11  . PROAIR HFA 108 (90 BASE) MCG/ACT inhaler INHALE 2 PUFFS UP TO FOUR TIMES DAILY AS NEEDED FOR WHEEZING. 8.5 g 5  . rifaximin (XIFAXAN) 550 MG TABS tablet Take 1 tablet (550 mg total) by mouth 3 (three) times daily. 42 tablet 0  . valsartan (DIOVAN) 320 MG tablet TAKE (1) TABLET BY MOUTH ONCE DAILY. 30 tablet 7  . warfarin (COUMADIN) 5 MG tablet TAKE 1/2 TABLET ON TUES AND FRI AND 1 TABLET ALL OTHER DAYS. (Patient taking differently: Takes 5 mg on Sunday, Tuesday , Thursday , all other days she takes 1/2 tablet) 30 tablet 5  . [DISCONTINUED] ferrous sulfate 325 (65 FE) MG tablet Take 1 tablet (325 mg total) by mouth daily with breakfast. IRON SUPPLEMENT.    . [DISCONTINUED] ipratropium (ATROVENT)  0.02 % nebulizer solution Take 500 mcg by nebulization every 4 (four) hours as needed. For shortness of breath     No current facility-administered medications for this visit.    Past Medical History  Diagnosis Date  . ASCVD (arteriosclerotic cardiovascular disease)     stent to mid and proximal left anterior descending in 06/2002;drug eluting stent placed in the second diagnol in 08/2003 after  A  non-st elevation myocardial infarction   . Tobacco user     stopped   . Hyperlipidemia     pulmonary embolism 2000 and 09/2008  . Hypertension   . Pulmonary embolism      2000/09/2008  . Thyroid disease     hypothyroidism  . Allergy history unknown   . GERD (gastroesophageal reflux disease)   . Diabetes mellitus     no insulin  . Asthma   . FH: colonic polyps     adenomataous  . Anticoagulated   . Diarrhea     acute  . Rectal bleeding   . Pedal edema   . Irritable bowel syndrome   . Coronary artery disease   . Paroxysmal atrial fibrillation     normal LV function; episodes occurred in 205 and 09/2007  . Hypothyroidism   . Peripheral edema   . Allergy   . Atrial fibrillation   . Chronic anticoagulation   . Venous stasis   . Insomnia   . Chronic pain of left knee   . Diarrhea     Past Surgical History  Procedure Laterality Date  . Cleft palate repair      4 surgeries  . Abdominal hysterectomy    . Appendectomy    . Cholecystectomy    . Heel spur surgery    . Knee surgery    . Breast reduction surgery    . Colonoscopy  2011    negative  . Oophorectomy    . Breast surgery    . Esophagogastroduodenoscopy N/A 11/11/2013    Procedure: ESOPHAGOGASTRODUODENOSCOPY (EGD);  Surgeon: Rogene Houston, MD;  Location: [facility];  Service: Endoscopy;  Laterality: N/A;  200-moved to 100 Ann notified pt  . Balloon dilation N/A 11/11/2013    Procedure: BALLOON DILATION;  Surgeon: Rogene Houston, MD;  Location: [facility];  Service: Endoscopy;  Laterality: N/A;  Venia Minks dilation N/A 11/11/2013    Procedure: Venia Minks DILATION;  Surgeon: Rogene Houston, MD;  Location: [facility];  Service: Endoscopy;  Laterality: N/A;  . Savory dilation N/A 11/11/2013    Procedure: SAVORY DILATION;  Surgeon: Rogene Houston, MD;  Location: [facility];  Service: Endoscopy;  Laterality: N/A;  . Esophagogastroduodenoscopy N/A 05/09/2014    Procedure: ESOPHAGOGASTRODUODENOSCOPY (EGD);  Surgeon: Rogene Houston, MD;  Location: [facility];  Service: Endoscopy;  Laterality: N/A;  . Colonoscopy N/A 05/10/2014    Procedure: COLONOSCOPY;  Surgeon: Rogene Houston,  MD;  Location: [facility];  Service: Endoscopy;  Laterality: N/A;    History   Social History  . Marital Status: Widowed    Spouse Name: N/A  . Number of Children: N/A  . Years of Education: N/A   Occupational History  . retired    Social History Main Topics  . Smoking status: Former Smoker -- 2.00 packs/day for 30 years    Types: Cigarettes    Start date: 05/05/1987  . Smokeless tobacco: Never Used  . Alcohol Use: No  . Drug Use: No  . Sexual Activity: No  Other Topics Concern  . Not on file   Social History Narrative     Filed Vitals:   10/07/14 1546  BP: 126/88  Pulse: 41  Height: 5\' 1"  (1.549 m)  Weight: 207 lb (93.895 kg)   HR: 56-60 bpm by auscultation  PHYSICAL EXAM General: NAD HEENT: Normal. Neck: No JVD, no thyromegaly. Lungs: Clear to auscultation bilaterally with normal respiratory effort. CV: Nondisplaced PMI.  Regular rate and rhythm, normal S1/S2, no S3/S4, no murmur. No pretibial or periankle edema.   Abdomen: Obese.  Neurologic: Alert and oriented x 3.  Psych: Normal affect. Skin: Normal. Musculoskeletal: No gross deformities. Extremities: No clubbing or cyanosis.   ECG: Most recent ECG reviewed.      ASSESSMENT AND PLAN: 1. CAD with h/o NSTEMI and prior PCI's: Given fatigue and orthopnea, will obtain a Lexiscan Cardiolite stress test and echocardiogram to evaluate for ischemia and/or systolic dysfunction. Will also reduce long-acting diltiazem to 180 mg daily.  2. Essential HTN: Well controlled. Will monitor given reduction in dilitazem dose.  3. Paroxysmal atrial fibrillation: Currently in a regular rhythm and without palpitations. Will reduce long-acting diltiazem to 180 mg daily given fatigue and bradycardia. Continue warfarin.  4. Pulmonary embolism: No recent recurrences. Continue warfarin.  Dispo: f/u 6 weeks.  Kate Sable, M.D., F.A.C.C.

## 2014-10-10 ENCOUNTER — Other Ambulatory Visit: Payer: Self-pay | Admitting: Cardiovascular Disease

## 2014-10-10 NOTE — Telephone Encounter (Signed)
The patient said that [name] is telling her they did not receive an order for Cardizem on 10/07/14. Please follow up and let the patient know when this is confirmed.

## 2014-10-10 NOTE — Telephone Encounter (Signed)
Refill confirmed sent to [facility] on 4/15

## 2014-10-17 ENCOUNTER — Encounter (HOSPITAL_COMMUNITY)
Admission: RE | Admit: 2014-10-17 | Discharge: 2014-10-17 | Disposition: A | Discharge status: Home / Self Care | Payer: Medicare Other | Source: Ambulatory Visit | Attending: Cardiovascular Disease | Admitting: Cardiovascular Disease

## 2014-10-17 ENCOUNTER — Encounter (HOSPITAL_COMMUNITY): Payer: Self-pay

## 2014-10-17 ENCOUNTER — Ambulatory Visit (HOSPITAL_COMMUNITY)
Admission: RE | Admit: 2014-10-17 | Discharge: 2014-10-17 | Disposition: A | Discharge status: Home / Self Care | Payer: Medicare Other | Source: Ambulatory Visit | Attending: Cardiovascular Disease | Admitting: Cardiovascular Disease

## 2014-10-17 DIAGNOSIS — R0602 Shortness of breath: Secondary | ICD-10-CM

## 2014-10-17 DIAGNOSIS — I2510 Atherosclerotic heart disease of native coronary artery without angina pectoris: Secondary | ICD-10-CM | POA: Diagnosis not present

## 2014-10-17 MED ORDER — SODIUM CHLORIDE 0.9 % IJ SOLN
INTRAMUSCULAR | Status: AC
  Administered 2014-10-17: 11:00:00
  Filled 2014-10-17: qty 3

## 2014-10-17 MED ORDER — TECHNETIUM TC 99M SESTAMIBI GENERIC - CARDIOLITE
10.0000 | Freq: Once | INTRAVENOUS | Status: AC | PRN
  Administered 2014-10-17: 10 via INTRAVENOUS

## 2014-10-17 MED ORDER — REGADENOSON 0.4 MG/5ML IV SOLN
0.4000 mg | Freq: Once | INTRAVENOUS | Status: AC
  Administered 2014-10-17: 0.4 mg via INTRAVENOUS

## 2014-10-17 MED ORDER — TECHNETIUM TC 99M SESTAMIBI - CARDIOLITE
30.0000 | Freq: Once | INTRAVENOUS | Status: AC | PRN
  Administered 2014-10-17: 11:00:00 30 via INTRAVENOUS

## 2014-10-17 MED ORDER — SODIUM CHLORIDE 0.9 % IJ SOLN: 10.0000 mL | INTRAMUSCULAR | Status: DC | PRN

## 2014-10-17 MED ORDER — REGADENOSON 0.4 MG/5ML IV SOLN
INTRAVENOUS | Status: AC
  Administered 2014-10-17: 0.4 mg via INTRAVENOUS
  Filled 2014-10-17: qty 5

## 2014-10-17 NOTE — Progress Notes (Signed)
*  PRELIMINARY RESULTS* Echocardiogram 2D Echocardiogram has been performed.  [name] 10/17/2014, 8:58 AM

## 2014-10-17 NOTE — Progress Notes (Signed)
Stress Lab Nurses Notes - [facility]  Lelaina P Bobian 10/17/2014 Reason for doing test: SOB Type of test: Lexi/Cardilite Nurse performing test: Stevphen Rochester RN Nuclear Medicine Tech: Thornton Papas Tech: Not Applicable MD performing test: Chauncey Cruel McDowell/K Malina Nails NP Family MD: Mickie Hillier Test explained and consent signed: Yes.   IV started: Saline lock started in radiology Symptoms: Nausea Treatment/Intervention: None Reason test stopped: protocol completed After recovery IV was: Saline Lock flushed Patient to return to Nuc. Med at :1050 Patient discharged: To NM Patient's Condition upon discharge was: stable Comments: symptoms resolved. Resting BP 120/68 HR 55. Peak BP 140/58  HR 83 Izrael Peak M

## 2014-10-20 ENCOUNTER — Encounter (HOSPITAL_COMMUNITY): Payer: Self-pay

## 2014-10-20 ENCOUNTER — Emergency Department (HOSPITAL_COMMUNITY)
Admission: EM | Admit: 2014-10-20 | Discharge: 2014-10-21 | Disposition: A | Discharge status: Home / Self Care | Payer: Medicare Other | Attending: Emergency Medicine | Admitting: Emergency Medicine

## 2014-10-20 ENCOUNTER — Other Ambulatory Visit: Payer: Self-pay

## 2014-10-20 DIAGNOSIS — E079 Disorder of thyroid, unspecified: Secondary | ICD-10-CM | POA: Diagnosis not present

## 2014-10-20 DIAGNOSIS — E119 Type 2 diabetes mellitus without complications: Secondary | ICD-10-CM | POA: Diagnosis not present

## 2014-10-20 DIAGNOSIS — Z86711 Personal history of pulmonary embolism: Secondary | ICD-10-CM | POA: Diagnosis not present

## 2014-10-20 DIAGNOSIS — Z79899 Other long term (current) drug therapy: Secondary | ICD-10-CM | POA: Diagnosis not present

## 2014-10-20 DIAGNOSIS — R110 Nausea: Secondary | ICD-10-CM | POA: Insufficient documentation

## 2014-10-20 DIAGNOSIS — K219 Gastro-esophageal reflux disease without esophagitis: Secondary | ICD-10-CM | POA: Insufficient documentation

## 2014-10-20 DIAGNOSIS — R112 Nausea with vomiting, unspecified: Secondary | ICD-10-CM | POA: Diagnosis not present

## 2014-10-20 DIAGNOSIS — E039 Hypothyroidism, unspecified: Secondary | ICD-10-CM | POA: Diagnosis not present

## 2014-10-20 DIAGNOSIS — E785 Hyperlipidemia, unspecified: Secondary | ICD-10-CM | POA: Insufficient documentation

## 2014-10-20 DIAGNOSIS — Z7901 Long term (current) use of anticoagulants: Secondary | ICD-10-CM | POA: Insufficient documentation

## 2014-10-20 DIAGNOSIS — I2510 Atherosclerotic heart disease of native coronary artery without angina pectoris: Secondary | ICD-10-CM | POA: Insufficient documentation

## 2014-10-20 DIAGNOSIS — G8929 Other chronic pain: Secondary | ICD-10-CM | POA: Diagnosis not present

## 2014-10-20 DIAGNOSIS — I10 Essential (primary) hypertension: Secondary | ICD-10-CM | POA: Insufficient documentation

## 2014-10-20 DIAGNOSIS — Z87891 Personal history of nicotine dependence: Secondary | ICD-10-CM | POA: Diagnosis not present

## 2014-10-20 DIAGNOSIS — R197 Diarrhea, unspecified: Secondary | ICD-10-CM | POA: Insufficient documentation

## 2014-10-20 DIAGNOSIS — J45909 Unspecified asthma, uncomplicated: Secondary | ICD-10-CM | POA: Diagnosis not present

## 2014-10-20 DIAGNOSIS — I4891 Unspecified atrial fibrillation: Secondary | ICD-10-CM | POA: Diagnosis not present

## 2014-10-20 LAB — COMPREHENSIVE METABOLIC PANEL
ALBUMIN: 4 g/dL (ref 3.5–5.2)
ALT: 11 U/L (ref 0–35)
AST: 21 U/L (ref 0–37)
Alkaline Phosphatase: 59 U/L (ref 39–117)
Anion gap: 13 (ref 5–15)
BUN: 14 mg/dL (ref 6–23)
CALCIUM: 9.8 mg/dL (ref 8.4–10.5)
CO2: 23 mmol/L (ref 19–32)
Chloride: 107 mmol/L (ref 96–112)
Creatinine, Ser: 0.9 mg/dL (ref 0.50–1.10)
GFR calc non Af Amer: 61 mL/min — ABNORMAL LOW (ref >90)
GFR, EST AFRICAN AMERICAN: 71 mL/min — AB (ref >90)
GLUCOSE: 131 mg/dL — AB (ref 70–99)
Potassium: 3.2 mmol/L — ABNORMAL LOW (ref 3.5–5.1)
Sodium: 143 mmol/L (ref 135–145)
TOTAL PROTEIN: 7.1 g/dL (ref 6.0–8.3)
Total Bilirubin: 0.5 mg/dL (ref 0.3–1.2)

## 2014-10-20 LAB — PROTIME-INR
INR: 2.32 — ABNORMAL HIGH (ref 0.00–1.49)
Prothrombin Time: 25.7 seconds — ABNORMAL HIGH (ref 11.6–15.2)

## 2014-10-20 LAB — CBC WITH DIFFERENTIAL/PLATELET
Basophils Absolute: 0 10*3/uL (ref 0.0–0.1)
Basophils Relative: 0 % (ref 0–1)
EOS ABS: 0.3 10*3/uL (ref 0.0–0.7)
EOS PCT: 3 % (ref 0–5)
HEMATOCRIT: 39.4 % (ref 36.0–46.0)
HEMOGLOBIN: 12.6 g/dL (ref 12.0–15.0)
Lymphocytes Relative: 19 % (ref 12–46)
Lymphs Abs: 1.9 10*3/uL (ref 0.7–4.0)
MCH: 26.5 pg (ref 26.0–34.0)
MCHC: 32 g/dL (ref 30.0–36.0)
MCV: 82.8 fL (ref 78.0–100.0)
Monocytes Absolute: 0.8 10*3/uL (ref 0.1–1.0)
Monocytes Relative: 8 % (ref 3–12)
Neutro Abs: 7.1 10*3/uL (ref 1.7–7.7)
Neutrophils Relative %: 70 % (ref 43–77)
Platelets: 284 10*3/uL (ref 150–400)
RBC: 4.76 MIL/uL (ref 3.87–5.11)
RDW: 16.4 % — AB (ref 11.5–15.5)
WBC: 10 10*3/uL (ref 4.0–10.5)

## 2014-10-20 LAB — LIPASE, BLOOD: Lipase: 32 U/L (ref 11–59)

## 2014-10-20 MED ORDER — ONDANSETRON HCL 4 MG/2ML IJ SOLN
INTRAMUSCULAR | Status: AC
  Filled 2014-10-20: qty 2

## 2014-10-20 MED ORDER — METOCLOPRAMIDE HCL 5 MG/ML IJ SOLN
10.0000 mg | Freq: Once | INTRAMUSCULAR | Status: AC
  Administered 2014-10-21: 10 mg via INTRAVENOUS
  Filled 2014-10-20: qty 2

## 2014-10-20 MED ORDER — DIPHENHYDRAMINE HCL 50 MG/ML IJ SOLN
25.0000 mg | Freq: Once | INTRAMUSCULAR | Status: AC
  Administered 2014-10-21: 25 mg via INTRAVENOUS
  Filled 2014-10-20: qty 1

## 2014-10-20 MED ORDER — ONDANSETRON HCL 4 MG/2ML IJ SOLN
4.0000 mg | Freq: Once | INTRAMUSCULAR | Status: AC
  Administered 2014-10-20: 4 mg via INTRAVENOUS

## 2014-10-20 MED ORDER — SODIUM CHLORIDE 0.9 % IV SOLN
Freq: Once | INTRAVENOUS | Status: AC
  Administered 2014-10-20: via INTRAVENOUS

## 2014-10-20 NOTE — ED Notes (Signed)
Nausea that started this evening, states sick since she ate a hamburger for supper, took phenergan for same

## 2014-10-20 NOTE — ED Provider Notes (Signed)
CSN: [id number]     Arrival date & time 10/20/14  2313 History  This chart was scribed for Delora Fuel, MD by [name], ED Scribe. This patient was seen in room APA03/APA03 and the patient's care was started at 11:26 PM.     Chief Complaint  Patient presents with  . Nausea   The history is provided by the patient. No language interpreter was used.     HPI Comments: [name] is a 74 y.o. female with hx HLD, HTN, GERD, DM who presents to the Emergency Department complaining of nausea that began this evening after eating a hamburger. Pt did vomit at home but has not vomited since being in the ED. She also complains of diarrhea. She has had 2 episodes of diarrhea today. Pt took Phenergan without relief. Pt denies recent sick contact with similar symptoms. Denies abdominal pain, constipation, shortness of breath, chest pain, or any other symptoms.    Past Medical History  Diagnosis Date  . ASCVD (arteriosclerotic cardiovascular disease)     stent to mid and proximal left anterior descending in 06/2002;drug eluting stent placed in the second diagnol in 08/2003 after  A  non-st elevation myocardial infarction   . Tobacco user     stopped   . Hyperlipidemia     pulmonary embolism 2000 and 09/2008  . Hypertension   . Pulmonary embolism     2000/09/2008  . Thyroid disease     hypothyroidism  . Allergy history unknown   . GERD (gastroesophageal reflux disease)   . Diabetes mellitus     no insulin  . Asthma   . FH: colonic polyps     adenomataous  . Anticoagulated   . Diarrhea     acute  . Rectal bleeding   . Pedal edema   . Irritable bowel syndrome   . Coronary artery disease   . Paroxysmal atrial fibrillation     normal LV function; episodes occurred in 205 and 09/2007  . Hypothyroidism   . Peripheral edema   . Allergy   . Atrial fibrillation   . Chronic anticoagulation   . Venous stasis   . Insomnia   . Chronic pain of left knee   . Diarrhea    Past Surgical History   Procedure Laterality Date  . Cleft palate repair      4 surgeries  . Abdominal hysterectomy    . Appendectomy    . Cholecystectomy    . Heel spur surgery    . Knee surgery    . Breast reduction surgery    . Colonoscopy  2011    negative  . Oophorectomy    . Breast surgery    . Esophagogastroduodenoscopy N/A 11/11/2013    Procedure: ESOPHAGOGASTRODUODENOSCOPY (EGD);  Surgeon: Rogene Houston, MD;  Location: [facility];  Service: Endoscopy;  Laterality: N/A;  200-moved to 100 Ann notified pt  . Balloon dilation N/A 11/11/2013    Procedure: BALLOON DILATION;  Surgeon: Rogene Houston, MD;  Location: [facility];  Service: Endoscopy;  Laterality: N/A;  Venia Minks dilation N/A 11/11/2013    Procedure: Venia Minks DILATION;  Surgeon: Rogene Houston, MD;  Location: [facility];  Service: Endoscopy;  Laterality: N/A;  . Savory dilation N/A 11/11/2013    Procedure: SAVORY DILATION;  Surgeon: Rogene Houston, MD;  Location: [facility];  Service: Endoscopy;  Laterality: N/A;  . Esophagogastroduodenoscopy N/A 05/09/2014    Procedure: ESOPHAGOGASTRODUODENOSCOPY (EGD);  Surgeon: Mechele Dawley  Laural Golden, MD;  Location: [facility];  Service: Endoscopy;  Laterality: N/A;  . Colonoscopy N/A 05/10/2014    Procedure: COLONOSCOPY;  Surgeon: Rogene Houston, MD;  [facility]: [facility];  Service: Endoscopy;  Laterality: N/A;   Family History  Problem Relation Age of Onset  . Heart attack Mother   . Diabetes Mother   . Hyperlipidemia Mother   . Heart attack Father   . Lung cancer Sister   . Lymphoma Brother   . Colon cancer Neg Hx    History  Substance Use Topics  . Smoking status: Former Smoker -- 2.00 packs/day for 30 years    Types: Cigarettes    Start date: 05/05/1987  . Smokeless tobacco: Never Used  . Alcohol Use: No   OB History    No data available     Review of Systems  Respiratory: Negative for shortness of breath.   Cardiovascular: Negative for chest pain.  Gastrointestinal:  Positive for nausea, vomiting and diarrhea. Negative for abdominal pain and constipation.  All other systems reviewed and are negative.     Allergies  Demerol  Home Medications   Prior to Admission medications   Medication Sig Start Date End Date Taking? Authorizing Provider  acetaminophen (TYLENOL) 500 MG tablet Take 500 mg by mouth daily as needed for headache.    Historical Provider, MD  albuterol (PROVENTIL) (2.5 MG/3ML) 0.083% nebulizer solution Take 3 mLs (2.5 mg total) by nebulization every 6 (six) hours as needed for wheezing or shortness of breath. 07/26/14   Mikey Kirschner, MD  Cholecalciferol (VITAMIN D-3) 5000 UNITS TABS Take 2 tablets by mouth daily.     Historical Provider, MD  diltiazem (CARDIZEM CD) 180 MG 24 hr capsule Take 1 capsule (180 mg total) by mouth daily. 10/07/14   Herminio Commons, MD  diphenoxylate-atropine (LOMOTIL) 2.5-0.025 MG per tablet TAKE 1 TABLET TWICE DAILY AS NEEDED FOR DIARRHEA OR LOOSE STOOLS. 08/23/14   Rogene Houston, MD  escitalopram (LEXAPRO) 20 MG tablet Take 1 tablet (20 mg total) by mouth daily. 06/09/14   Mikey Kirschner, MD  fluticasone (FLOVENT HFA) 44 MCG/ACT inhaler Inhale 2 puffs into the lungs daily as needed (Shortness of Breath).     Historical Provider, MD  levothyroxine (SYNTHROID, LEVOTHROID) 50 MCG tablet TAKE (1) TABLET BY MOUTH ONCE DAILY. 09/02/14   Mikey Kirschner, MD  ondansetron (ZOFRAN-ODT) 4 MG disintegrating tablet Take 4 mg by mouth every 8 (eight) hours as needed for nausea or vomiting.    Historical Provider, MD  oxycodone (OXY-IR) 5 MG capsule Take 1 capsule (5 mg total) by mouth 4 (four) times daily as needed for pain. 08/24/14   Mikey Kirschner, MD  pantoprazole (PROTONIX) 40 MG tablet Take 1 tablet (40 mg total) by mouth 2 (two) times daily before a meal. Patient taking differently: Take 40 mg by mouth 2 (two) times daily.  11/11/13   Rogene Houston, MD  pravastatin (PRAVACHOL) 40 MG tablet TAKE ONE TABLET BY MOUTH  AT BEDTIME. 10/06/14   Mikey Kirschner, MD  PROAIR HFA 108 [id number] BASE) MCG/ACT inhaler INHALE 2 PUFFS UP TO FOUR TIMES DAILY AS NEEDED FOR WHEEZING. 06/20/14   Mikey Kirschner, MD  rifaximin (XIFAXAN) 550 MG TABS tablet Take 1 tablet (550 mg total) by mouth 3 (three) times daily. 08/23/14   Rogene Houston, MD  valsartan (DIOVAN) 320 MG tablet TAKE (1) TABLET BY MOUTH ONCE DAILY. 08/08/14   Lendon Colonel,  NP  warfarin (COUMADIN) 5 MG tablet TAKE 1/2 TABLET ON TUES AND FRI AND 1 TABLET ALL OTHER DAYS. Patient taking differently: Takes 5 mg on Sunday, Tuesday , Thursday , all other days she takes 1/2 tablet 06/20/14   Mikey Kirschner, MD   Triage Vitals: BP 151/119 mmHg  Pulse 61  Temp(Src) 97.4 F (36.3 C) (Oral)  Resp 20  Ht 5\' 1"  (1.549 m)  Wt 205 lb (92.987 kg)  BMI 38.75 kg/m2  SpO2 100%   Physical Exam  Constitutional: She is oriented to person, place, and time. She appears well-developed and well-nourished. No distress.  HENT:  Head: Normocephalic and atraumatic.  Eyes: Conjunctivae and EOM are normal.  Neck: Neck supple. No tracheal deviation present.  Cardiovascular: Normal rate, regular rhythm and normal heart sounds.   Pulmonary/Chest: Effort normal and breath sounds normal. No respiratory distress.  Abdominal: Soft. There is no tenderness. There is no rebound and no guarding.  Actively retching. Bowel sounds decreased.   Musculoskeletal: Normal range of motion.  Neurological: She is alert and oriented to person, place, and time.  Skin: Skin is warm and dry.  Psychiatric: She has a normal mood and affect. Her behavior is normal.  Nursing note and vitals reviewed.   ED Course  Procedures (including critical care time)  DIAGNOSTIC STUDIES: Oxygen Saturation is 100% on RA, normal by my interpretation.    COORDINATION OF CARE: 11:29 PM-Discussed treatment plan which includes CBC, Lipase, CMP with pt at bedside and pt agreed to plan.   Labs Review Results for orders  placed or performed during the hospital encounter of 10/20/14  Comprehensive metabolic panel  Result Value Ref Range   Sodium 143 135 - 145 mmol/L   Potassium 3.2 (L) 3.5 - 5.1 mmol/L   Chloride 107 96 - 112 mmol/L   CO2 23 19 - 32 mmol/L   Glucose, Bld 131 (H) 70 - 99 mg/dL   BUN 14 6 - 23 mg/dL   Creatinine, Ser 0.90 0.50 - 1.10 mg/dL   Calcium 9.8 8.4 - 10.5 mg/dL   Total Protein 7.1 6.0 - 8.3 g/dL   Albumin 4.0 3.5 - 5.2 g/dL   AST 21 0 - 37 U/L   ALT 11 0 - 35 U/L   Alkaline Phosphatase 59 39 - 117 U/L   Total Bilirubin 0.5 0.3 - 1.2 mg/dL   GFR calc non Af Amer 61 (L) >90 mL/min   GFR calc Af Amer 71 (L) >90 mL/min   Anion gap 13 5 - 15  Lipase, blood  Result Value Ref Range   Lipase 32 11 - 59 U/L  CBC with Differential  Result Value Ref Range   WBC 10.0 4.0 - 10.5 K/uL   RBC 4.76 3.87 - 5.11 MIL/uL   Hemoglobin 12.6 12.0 - 15.0 g/dL   HCT 39.4 36.0 - 46.0 %   MCV 82.8 78.0 - 100.0 fL   MCH 26.5 26.0 - 34.0 pg   MCHC 32.0 30.0 - 36.0 g/dL   RDW 16.4 (H) 11.5 - 15.5 %   Platelets 284 150 - 400 K/uL   Neutrophils Relative % 70 43 - 77 %   Neutro Abs 7.1 1.7 - 7.7 K/uL   Lymphocytes Relative 19 12 - 46 %   Lymphs Abs 1.9 0.7 - 4.0 K/uL   Monocytes Relative 8 3 - 12 %   Monocytes Absolute 0.8 0.1 - 1.0 K/uL   Eosinophils Relative 3 0 - 5 %  Eosinophils Absolute 0.3 0.0 - 0.7 K/uL   Basophils Relative 0 0 - 1 %   Basophils Absolute 0.0 0.0 - 0.1 K/uL  Protime-INR  Result Value Ref Range   Prothrombin Time 25.7 (H) 11.6 - 15.2 seconds   INR 2.32 (H) 0.00 - 1.49     EKG Interpretation   Date/Time:  Thursday October 20 2014 23:46:49 EDT Ventricular Rate:  80 PR Interval:    QRS Duration: 99 QT Interval:  497 QTC Calculation: 573 R Axis:   4 Text Interpretation:  Atrial fibrillation Abnormal R-wave progression,  early transition Borderline prolonged QT interval When compared with ECG  of 05/08/2014, No significant change was found Confirmed by [facility]  MD,   Yomaris Palecek ([id number]) on 10/20/2014 11:51:39 PM      MDM   Final diagnoses:  Nausea without vomiting    Nausea with dry heaves. This is likely due to a viral gastritis. She will be given IV hydration and IV ondansetron.  There is no improvement with ondansetron. She is given a dose of metoclopramide.  She also had no relief with metoclopramide and will be given a dose of promethazine.   She had adequate relief of nausea with promethazine. She has promethazine at home. Given intractable nausea and the ED, she is given a prescription for promethazine suppository. Follow-up with PCP as needed.  I personally performed the services described in this documentation, which was scribed in my presence. The recorded information has been reviewed and is accurate.       Delora Fuel, MD [id number]

## 2014-10-21 ENCOUNTER — Ambulatory Visit: Payer: Medicare Other

## 2014-10-21 DIAGNOSIS — R110 Nausea: Secondary | ICD-10-CM | POA: Diagnosis not present

## 2014-10-21 MED ORDER — PROMETHAZINE HCL 25 MG/ML IJ SOLN
12.5000 mg | Freq: Once | INTRAMUSCULAR | Status: AC
  Administered 2014-10-21: 12.5 mg via INTRAVENOUS
  Filled 2014-10-21: qty 1

## 2014-10-21 MED ORDER — PROMETHAZINE HCL 25 MG RE SUPP: 25.0000 mg | Freq: Four times a day (QID) | RECTAL | Status: DC | PRN

## 2014-10-21 NOTE — Discharge Instructions (Signed)
Nausea, Adult Nausea is the feeling that you have an upset stomach or have to vomit. Nausea by itself is not likely a serious concern, but it may be an early sign of more serious medical problems. As nausea gets worse, it can lead to vomiting. If vomiting develops, there is the risk of dehydration.  CAUSES   Viral infections.  Food poisoning.  Medicines.  Pregnancy.  Motion sickness.  Migraine headaches.  Emotional distress.  Severe pain from any source.  Alcohol intoxication. HOME CARE INSTRUCTIONS  Get plenty of rest.  Ask your caregiver about specific rehydration instructions.  Eat small amounts of food and sip liquids more often.  Take all medicines as told by your caregiver. SEEK MEDICAL CARE IF:  You have not improved after 2 days, or you get worse.  You have a headache. SEEK IMMEDIATE MEDICAL CARE IF:   You have a fever.  You faint.  You keep vomiting or have blood in your vomit.  You are extremely weak or dehydrated.  You have dark or bloody stools.  You have severe chest or abdominal pain. MAKE SURE YOU:  Understand these instructions.  Will watch your condition.  Will get help right away if you are not doing well or get worse. Document Released: 07/18/2004 Document Revised: 03/04/2012 Document Reviewed: 02/20/2011 [facility] Patient Information 2015 [place], [place]. This information is not intended to replace advice given to you by your health care provider. Make sure you discuss any questions you have with your health care provider.  Promethazine suppositories What is this medicine? PROMETHAZINE (proe METH a zeen) is an antihistamine. It is used to treat allergic reactions and to treat or prevent nausea and vomiting from illness or motion sickness. It is also used to make you sleep before surgery, and to help treat pain or nausea after surgery. This medicine may be used for other purposes; ask your health care provider or pharmacist if you have  questions. COMMON BRAND NAME(S): Phenadoz, Phenergan, Promethegan What should I tell my health care provider before I take this medicine? They need to know if you have any of these conditions: -glaucoma -high blood pressure or heart disease -kidney disease -liver disease -lung or breathing disease, like asthma -prostate trouble -pain or difficulty passing urine -seizures -an unusual or allergic reaction to promethazine or phenothiazines, other medicines, foods, dyes, or preservatives -pregnant or trying to get pregnant -breast-feeding How should I use this medicine? This medicine is for rectal use only. Do not take by mouth. Wash your hands before and after use. Take off the foil wrapping. Wet the tip of the suppository with cold tap water to make it easier to use. Lie on your side with your lower leg straightened out and your upper leg bent forward toward your stomach. Lift upper buttock to expose the rectal area. Apply gentle pressure to insert the suppository completely into the rectum, pointed end first. Hold buttocks together for a few seconds. Remain lying down for about 15 minutes to avoid having the suppository come out. Do not use more often than directed. Talk to your pediatrician regarding the use of this medicine in children. Special care may be needed. This medicine should not be given to infants and children younger than 57 years old. Overdosage: If you think you have taken too much of this medicine contact a poison control center or emergency room at once. NOTE: This medicine is only for you. Do not share this medicine with others. What if I miss a dose?  If you miss a dose, use it as soon as you can. If it is almost time for your next dose, use only that dose. Do not use double doses. What may interact with this medicine? Do not take this medicine with any of the following medications: -cisapride -dofetilide -dronedarone -MAOIs like Carbex, Eldepryl, Marplan, Nardil,  Parnate -pimozide -quinidine, including dextromethorphan; quinidine -thioridazine -ziprasidone This medicine may also interact with the following medications: -certain medicines for depression, anxiety, or psychotic disturbances -certain medicines for anxiety or sleep -certain medicines for seizures like carbamazepine, phenobarbital, phenytoin -certain medicines for movement abnormalities as in Parkinson's disease, or for gastrointestinal problems -epinephrine -medicines for allergies or colds -muscle relaxants -narcotic medicines for pain -other medicines that prolong the QT interval (cause an abnormal heart rhythm) -tramadol -trimethobenzamide This list may not describe all possible interactions. Give your health care provider a list of all the medicines, herbs, non-prescription drugs, or dietary supplements you use. Also tell them if you smoke, drink alcohol, or use illegal drugs. Some items may interact with your medicine. What should I watch for while using this medicine? Tell your doctor or health care professional if your symptoms do not start to get better in 1 to 2 days. You may get drowsy or dizzy. Do not drive, use machinery, or do anything that needs mental alertness until you know how this medicine affects you. To reduce the risk of dizzy or fainting spells, do not stand or sit up quickly, especially if you are an older patient. Alcohol may increase dizziness and drowsiness. Avoid alcoholic drinks. Your mouth may get dry. Chewing sugarless gum or sucking hard candy, and drinking plenty of water may help. Contact your doctor if the problem does not go away or is severe. This medicine may cause dry eyes and blurred vision. If you wear contact lenses you may feel some discomfort. Lubricating drops may help. See your eye doctor if the problem does not go away or is severe. This medicine can make you more sensitive to the sun. Keep out of the sun. If you cannot avoid being in the sun,  wear protective clothing and use sunscreen. Do not use sun lamps or tanning beds/booths. If you are diabetic, check your blood-sugar levels regularly. What side effects may I notice from receiving this medicine? Side effects that you should report to your doctor or health care professional as soon as possible: -blurred vision -irregular heartbeat, palpitations or chest pain -muscle or facial twitches -pain or difficulty passing urine -seizures -skin rash -slowed or shallow breathing -unusual bleeding or bruising -yellowing of the eyes or skin Side effects that usually do not require medical attention (report to your doctor or health care professional if they continue or are bothersome): -headache -nightmares, agitation, nervousness, excitability, not able to sleep (these are more likely in children) -stuffy nose This list may not describe all possible side effects. Call your doctor for medical advice about side effects. You may report side effects to FDA at 1-800-FDA-1088. Where should I keep my medicine? Keep out of the reach of children. Store in a refrigerator between 2 and 8 degrees C (36 and 46 degrees F). Throw away any unused medicine after the expiration date. NOTE: This sheet is a summary. It may not cover all possible information. If you have questions about this medicine, talk to your doctor, pharmacist, or health care provider.  2015, [employer]vier/[employer] Standard. (2013-02-10 15:57:29)

## 2014-10-21 NOTE — ED Notes (Signed)
Spoke with sharon roberts, pt's niece and she states she is on the way to pick her up for discharge

## 2014-10-21 NOTE — ED Notes (Signed)
Pt c/o still feeling nauseous, EDP made aware and orders given

## 2014-10-21 NOTE — ED Notes (Signed)
Pt assisted to bsc and back to bed; pt's linens changed due to incontinence of urine

## 2014-10-21 NOTE — ED Notes (Signed)
Pt assisted to bsc and back to bed ? ?

## 2014-10-29 ENCOUNTER — Other Ambulatory Visit: Payer: Self-pay | Admitting: Family Medicine

## 2014-11-02 ENCOUNTER — Ambulatory Visit (HOSPITAL_COMMUNITY): Payer: No Typology Code available for payment source

## 2014-11-03 ENCOUNTER — Ambulatory Visit (INDEPENDENT_AMBULATORY_CARE_PROVIDER_SITE_OTHER): Payer: Medicare Other | Admitting: *Deleted

## 2014-11-03 DIAGNOSIS — Z7901 Long term (current) use of anticoagulants: Secondary | ICD-10-CM

## 2014-11-03 LAB — POCT INR: INR: 1.6

## 2014-11-07 ENCOUNTER — Ambulatory Visit (INDEPENDENT_AMBULATORY_CARE_PROVIDER_SITE_OTHER): Payer: No Typology Code available for payment source | Admitting: Internal Medicine

## 2014-11-09 ENCOUNTER — Ambulatory Visit (HOSPITAL_COMMUNITY): Payer: No Typology Code available for payment source

## 2014-11-16 ENCOUNTER — Ambulatory Visit: Payer: Medicare Other | Admitting: Family Medicine

## 2014-11-17 ENCOUNTER — Encounter: Payer: Self-pay | Admitting: Family Medicine

## 2014-11-17 ENCOUNTER — Ambulatory Visit: Payer: Medicare Other

## 2014-11-17 ENCOUNTER — Ambulatory Visit (INDEPENDENT_AMBULATORY_CARE_PROVIDER_SITE_OTHER): Payer: Medicare Other | Admitting: Family Medicine

## 2014-11-17 VITALS — BP 110/78 | Temp 98.5°F | Ht 61.0 in | Wt 205.0 lb

## 2014-11-17 DIAGNOSIS — Z7901 Long term (current) use of anticoagulants: Secondary | ICD-10-CM

## 2014-11-17 DIAGNOSIS — I25118 Atherosclerotic heart disease of native coronary artery with other forms of angina pectoris: Secondary | ICD-10-CM | POA: Diagnosis not present

## 2014-11-17 DIAGNOSIS — M1711 Unilateral primary osteoarthritis, right knee: Secondary | ICD-10-CM

## 2014-11-17 LAB — POCT INR: INR: 1.7

## 2014-11-17 MED ORDER — PREDNISONE 20 MG PO TABS: ORAL_TABLET | ORAL | Status: DC

## 2014-11-17 NOTE — Patient Instructions (Signed)
Coumadin. Take one half on Monday and Wednesday and take one tablet all other days. Recheck in 4 weeks.

## 2014-11-17 NOTE — Progress Notes (Signed)
   Subjective:    Patient ID: [name], female    DOB: 01/05/1941, 74 y.o.   MRN: [id number]  HPIpt arrives today to discuss diarrhea. She states she has had this for over 3 year. She sees Dr. Laural Golden. Taking lomotil. It does help but it makes her constipated.   Pt also wants to discuss getting a knee replacement.   INR today 1.7.   Pt taking escitalopram 20mg  but wants to increase dose. Having some depression.   Wheezing. Ongoing.    Review of Systems     Objective:   Physical Exam        Assessment & Plan:

## 2014-11-17 NOTE — Progress Notes (Signed)
   Subjective:    Patient ID: [name], female    DOB: 12/04/1940, 74 y.o.   MRN: [id number]  HPI Has a tendency towzrds loose stools  Lomotil helps  this is prescribed by Dr. Melony Overly. Patient frustrated that she continues to have diarrhea.   History of substantial arthritis. Right knee particularly. Progressive. Patient now on using walker. Would like to see a orthopedist in this regard. 100 she may be a candidate for surgery or replacement. Dr Noemi Chapel referral pt requests  Patient claims compliance with her Coumadin.  Unfortunately asthma has acted up recently. More wheezing at times.  Day [phone] Also [phone] a friend rose  Review of Systems No headache no chest pain no abdominal pain no change in bowel habits ongoing chronic loose stools as noted    Objective:   Physical Exam  Alert vitals stable HEENT Baseline lungs bilateral wheezes heart regular rate and rhythm. Ankles without edema. Right knee impressive crepitations pain with extension flexion and swelling      Assessment & Plan:  Impression 1 chronic anticoagulation INR adjusted #2 asthma moderate exacerbation #3 right knee arthritis worsening #4 chronic diarrhea discuss plan follow-up as scheduled. Coumadin as noted. Referral to orthopedist. Diet exercise discussed. Prednisone taper. WSL

## 2014-11-18 ENCOUNTER — Telehealth: Payer: Self-pay | Admitting: Family Medicine

## 2014-11-18 ENCOUNTER — Ambulatory Visit: Payer: Medicare Other | Admitting: Cardiovascular Disease

## 2014-11-18 NOTE — Telephone Encounter (Signed)
Pt called wanting to know if the Dr. Was going to call her in a different depression medicine.

## 2014-11-18 NOTE — Telephone Encounter (Signed)
Discussed with patient. Patient advised that Dr Richardson Landry Would rec staying on the same since her depresion is primarily coming from her medical conditions. Hopefully ortho is going to help. Patient verbalized understanding.

## 2014-11-18 NOTE — Telephone Encounter (Signed)
Would rec staying on the same since her depresion is primarily coming from her medical conditions. Hopefully ortho is going to help

## 2014-11-23 ENCOUNTER — Ambulatory Visit: Payer: Medicare Other | Admitting: Family Medicine

## 2014-12-03 ENCOUNTER — Other Ambulatory Visit: Payer: Self-pay | Admitting: Family Medicine

## 2014-12-06 ENCOUNTER — Ambulatory Visit (INDEPENDENT_AMBULATORY_CARE_PROVIDER_SITE_OTHER): Payer: No Typology Code available for payment source | Admitting: Internal Medicine

## 2014-12-08 ENCOUNTER — Ambulatory Visit: Payer: Medicare Other | Admitting: Family Medicine

## 2014-12-09 ENCOUNTER — Encounter: Payer: Self-pay | Admitting: Family Medicine

## 2014-12-09 ENCOUNTER — Telehealth: Payer: Self-pay | Admitting: *Deleted

## 2014-12-09 ENCOUNTER — Ambulatory Visit (INDEPENDENT_AMBULATORY_CARE_PROVIDER_SITE_OTHER): Payer: Medicare Other | Admitting: Family Medicine

## 2014-12-09 VITALS — BP 118/72 | Temp 97.8°F | Wt 204.0 lb

## 2014-12-09 DIAGNOSIS — J4541 Moderate persistent asthma with (acute) exacerbation: Secondary | ICD-10-CM

## 2014-12-09 DIAGNOSIS — I25118 Atherosclerotic heart disease of native coronary artery with other forms of angina pectoris: Secondary | ICD-10-CM | POA: Diagnosis not present

## 2014-12-09 DIAGNOSIS — Z7901 Long term (current) use of anticoagulants: Secondary | ICD-10-CM

## 2014-12-09 LAB — POCT INR: INR: 1.7

## 2014-12-09 MED ORDER — PREDNISONE 20 MG PO TABS: ORAL_TABLET | ORAL | Status: DC

## 2014-12-09 MED ORDER — FLUTICASONE PROPIONATE HFA 110 MCG/ACT IN AERO: 2.0000 | INHALATION_SPRAY | Freq: Two times a day (BID) | RESPIRATORY_TRACT | Status: DC

## 2014-12-09 NOTE — Progress Notes (Signed)
   Subjective:    Patient ID: [name], female    DOB: 1941/01/25, 74 y.o.   MRN: [id number]  Wheezing  This is a new problem. The current episode started 1 to 4 weeks ago. Associated symptoms include shortness of breath. Treatments tried: prednisone, neb treatments, inhalers.    this is second exacerbation several weeks.  Compliant with bp meds, no s e s , watching salt , not exercising  Mostly compliant with counmadin, may have middrf fpodr ;sdy niyr, up for re ck Was on sterile and inhaler and got off it.   Cough tried nonproductive.   Review of Systems  Respiratory: Positive for shortness of breath and wheezing.    no headache no chest pain no fever     Objective:   Physical Exam   alert vitals stable. H&T normal. Lungs bilateral wheezes no tachypnea no crackles heart regular in rhythm.      Assessment & Plan:   impression exacerbation of asthma with frequent recurrent symptoms 2 Htn good control 3 anticoag uncertain status plan re-initiate steroid-eluting inhaler Flovent 110 2 puffs twice a day. Prednisone taper. Follow-up as scheduled WSL

## 2014-12-15 ENCOUNTER — Ambulatory Visit: Payer: Medicare Other

## 2014-12-19 ENCOUNTER — Other Ambulatory Visit: Payer: Self-pay

## 2015-01-02 ENCOUNTER — Encounter: Payer: Self-pay | Admitting: Cardiovascular Disease

## 2015-01-02 ENCOUNTER — Ambulatory Visit (INDEPENDENT_AMBULATORY_CARE_PROVIDER_SITE_OTHER): Payer: Medicare Other | Admitting: Cardiovascular Disease

## 2015-01-02 VITALS — BP 110/68 | HR 104 | Ht 62.0 in | Wt 213.0 lb

## 2015-01-02 DIAGNOSIS — I480 Paroxysmal atrial fibrillation: Secondary | ICD-10-CM

## 2015-01-02 DIAGNOSIS — E785 Hyperlipidemia, unspecified: Secondary | ICD-10-CM

## 2015-01-02 DIAGNOSIS — I10 Essential (primary) hypertension: Secondary | ICD-10-CM | POA: Diagnosis not present

## 2015-01-02 DIAGNOSIS — I2699 Other pulmonary embolism without acute cor pulmonale: Secondary | ICD-10-CM

## 2015-01-02 DIAGNOSIS — I2510 Atherosclerotic heart disease of native coronary artery without angina pectoris: Secondary | ICD-10-CM

## 2015-01-02 MED ORDER — ATORVASTATIN CALCIUM 40 MG PO TABS: 40.0000 mg | ORAL_TABLET | Freq: Every day | ORAL | Status: DC

## 2015-01-02 NOTE — Addendum Note (Signed)
Addended by: Barbarann Ehlers A on: 01/02/2015 04:41 PM   Modules accepted: Orders, Medications

## 2015-01-02 NOTE — Patient Instructions (Addendum)
Your physician wants you to follow-up in: 1 year with Dr.Koneswaran You will receive a reminder letter in the mail two months in advance. If you don't receive a letter, please call our office to schedule the follow-up appointment.     STOP Pravastatin   START Atorvstatin to 40 mg daily    Thank you for choosing [facility] !

## 2015-01-02 NOTE — Progress Notes (Signed)
Patient ID: Ashlee ABELA HALBRITTER, female   DOB: 1941-05-19, 74 y.o.   MRN: [id number]      SUBJECTIVE: The patient presents for follow up after undergoing cardiovascular testing performed for the evaluation of fatigue and orthopnea. Past medical history is significant for coronary artery disease and has had multiple percutaneous coronary interventions and has a history of a non-STEMI. She also has a history of hypertension, pulmonary embolism, and paroxysmal atrial fibrillation.  She underwent a nuclear stress test on 10/17/14 which demonstrated a small region of basal inferolateral ischemia. It was deemed a low risk study. LVEF 78% with normal LV wall motion.  Echocardiogram showed normal left ventricular systolic function, LVEF [id number], mild LVH with grade 2 diastolic dysfunction.  She denies chest pain. She is allergic to ragweed and her breathing symptoms improved with albuterol. She has severe bilateral knee pain and is going to see an orthopedic surgeon tomorrow.  Review of Systems: As per "subjective", otherwise negative.  Allergies  Allergen Reactions  . Demerol Nausea And Vomiting    Current Outpatient Prescriptions  Medication Sig Dispense Refill  . acetaminophen (TYLENOL) 500 MG tablet Take 500 mg by mouth daily as needed for headache.    . albuterol (PROVENTIL) (2.5 MG/3ML) 0.083% nebulizer solution Take 3 mLs (2.5 mg total) by nebulization every 6 (six) hours as needed for wheezing or shortness of breath. 75 mL 5  . Cholecalciferol (VITAMIN D-3) 5000 UNITS TABS Take 2 tablets by mouth daily.     Marland Kitchen diltiazem (CARDIZEM CD) 180 MG 24 hr capsule Take 1 capsule (180 mg total) by mouth daily. 90 capsule 3  . diphenoxylate-atropine (LOMOTIL) 2.5-0.025 MG per tablet TAKE 1 TABLET TWICE DAILY AS NEEDED FOR DIARRHEA OR LOOSE STOOLS. 60 tablet 2  . escitalopram (LEXAPRO) 20 MG tablet TAKE (1) TABLET BY MOUTH ONCE DAILY. 30 tablet 0  . fluticasone (FLOVENT HFA) 110 MCG/ACT inhaler Inhale 2 puffs  into the lungs 2 (two) times daily. 1 Inhaler 0  . levothyroxine (SYNTHROID, LEVOTHROID) 50 MCG tablet TAKE (1) TABLET BY MOUTH ONCE DAILY. 30 tablet 5  . ondansetron (ZOFRAN-ODT) 4 MG disintegrating tablet Take 4 mg by mouth every 8 (eight) hours as needed for nausea or vomiting.    Marland Kitchen oxycodone (OXY-IR) 5 MG capsule Take 1 capsule (5 mg total) by mouth 4 (four) times daily as needed for pain. 120 capsule 0  . pantoprazole (PROTONIX) 40 MG tablet Take 1 tablet (40 mg total) by mouth 2 (two) times daily before a meal. (Patient taking differently: Take 40 mg by mouth 2 (two) times daily. ) 60 tablet 3  . pravastatin (PRAVACHOL) 40 MG tablet TAKE ONE TABLET BY MOUTH AT BEDTIME. 30 tablet 5  . PROAIR HFA 108 (90 BASE) MCG/ACT inhaler INHALE 2 PUFFS UP TO FOUR TIMES DAILY AS NEEDED FOR WHEEZING. 8.5 g 5  . promethazine (PHENERGAN) 25 MG suppository Place 1 suppository (25 mg total) rectally every 6 (six) hours as needed for nausea or vomiting. 12 suppository 0  . rifaximin (XIFAXAN) 550 MG TABS tablet Take 1 tablet (550 mg total) by mouth 3 (three) times daily. 42 tablet 0  . valsartan (DIOVAN) 320 MG tablet TAKE (1) TABLET BY MOUTH ONCE DAILY. 30 tablet 7  . warfarin (COUMADIN) 5 MG tablet TAKE 1/2 TABLET ON TUES AND FRI AND 1 TABLET ALL OTHER DAYS. (Patient taking differently: Takes 5 mg on Sunday, Tuesday , Thursday , all other days she takes 1/2 tablet) 30 tablet 5  . [DISCONTINUED]  ferrous sulfate 325 (65 FE) MG tablet Take 1 tablet (325 mg total) by mouth daily with breakfast. IRON SUPPLEMENT.    . [DISCONTINUED] ipratropium (ATROVENT) 0.02 % nebulizer solution Take 500 mcg by nebulization every 4 (four) hours as needed. For shortness of breath     No current facility-administered medications for this visit.    Past Medical History  Diagnosis Date  . ASCVD (arteriosclerotic cardiovascular disease)     stent to mid and proximal left anterior descending in 06/2002;drug eluting stent placed in the  second diagnol in 08/2003 after  A  non-st elevation myocardial infarction   . Tobacco user     stopped   . Hyperlipidemia     pulmonary embolism 2000 and 09/2008  . Hypertension   . Pulmonary embolism     2000/09/2008  . Thyroid disease     hypothyroidism  . Allergy history unknown   . GERD (gastroesophageal reflux disease)   . Diabetes mellitus     no insulin  . Asthma   . FH: colonic polyps     adenomataous  . Anticoagulated   . Diarrhea     acute  . Rectal bleeding   . Pedal edema   . Irritable bowel syndrome   . Coronary artery disease   . Paroxysmal atrial fibrillation     normal LV function; episodes occurred in 205 and 09/2007  . Hypothyroidism   . Peripheral edema   . Allergy   . Atrial fibrillation   . Chronic anticoagulation   . Venous stasis   . Insomnia   . Chronic pain of left knee   . Diarrhea     Past Surgical History  Procedure Laterality Date  . Cleft palate repair      4 surgeries  . Abdominal hysterectomy    . Appendectomy    . Cholecystectomy    . Heel spur surgery    . Knee surgery    . Breast reduction surgery    . Colonoscopy  2011    negative  . Oophorectomy    . Breast surgery    . Esophagogastroduodenoscopy N/A 11/11/2013    Procedure: ESOPHAGOGASTRODUODENOSCOPY (EGD);  Surgeon: Rogene Houston, MD;  Location: [facility];  Service: Endoscopy;  Laterality: N/A;  200-moved to 100 Ann notified pt  . Balloon dilation N/A 11/11/2013    Procedure: BALLOON DILATION;  Surgeon: Rogene Houston, MD;  Location: [facility];  Service: Endoscopy;  Laterality: N/A;  Venia Minks dilation N/A 11/11/2013    Procedure: Venia Minks DILATION;  Surgeon: Rogene Houston, MD;  Location: [facility];  Service: Endoscopy;  Laterality: N/A;  . Savory dilation N/A 11/11/2013    Procedure: SAVORY DILATION;  Surgeon: Rogene Houston, MD;  Location: [facility];  Service: Endoscopy;  Laterality: N/A;  . Esophagogastroduodenoscopy N/A 05/09/2014    Procedure:  ESOPHAGOGASTRODUODENOSCOPY (EGD);  Surgeon: Rogene Houston, MD;  Location: [facility];  Service: Endoscopy;  Laterality: N/A;  . Colonoscopy N/A 05/10/2014    Procedure: COLONOSCOPY;  Surgeon: Rogene Houston, MD;  Location: [facility];  Service: Endoscopy;  Laterality: N/A;    History   Social History  . Marital Status: Widowed    Spouse Name: N/A  . Number of Children: N/A  . Years of Education: N/A   Occupational History  . retired    Social History Main Topics  . Smoking status: Former Smoker -- 2.00 packs/day for 30 years    Types: Cigarettes  Start date: 05/05/1987  . Smokeless tobacco: Never Used  . Alcohol Use: No  . Drug Use: No  . Sexual Activity: No   Other Topics Concern  . Not on file   Social History Narrative     Filed Vitals:   01/02/15 1550  BP: 110/68  Pulse: 104  Height: 5\' 2"  (1.575 m)  Weight: 213 lb (96.616 kg)  SpO2: 95%   HR by auscultation: 68 bpm  PHYSICAL EXAM General: NAD HEENT: Normal. Neck: No JVD, no thyromegaly. Lungs: Clear to auscultation bilaterally with normal respiratory effort. CV: Nondisplaced PMI. Regular rate and rhythm, normal S1/S2, no S3/S4, no murmur. No pretibial or periankle edema.  Abdomen: Obese.  Neurologic: Alert and oriented x 3.  Psych: Normal affect. Skin: Normal. Extremities: No clubbing or cyanosis.   ECG: Most recent ECG reviewed.      ASSESSMENT AND PLAN: 1. CAD with h/o NSTEMI and prior PCI's: Low risk nuclear stress test as noted above. Continue Diovan and statin (recommend switching to Lipitor as LDL elevated on pravastatin). No beta blockers due to pulmonary disease. No ASA due to warfarin.  2. Essential HTN: Well controlled. No changes.  3. Paroxysmal atrial fibrillation: Currently in a regular rhythm and without palpitations. Continue long-acting diltiazem 180 mg daily along with warfarin.  4. Pulmonary embolism: No recent recurrences. Continue warfarin.  5.  Hyperlipidemia: Lipids on 09/23/14 showed total cholesterol 189 triglycerides 143, HDL 54, LDL 106. Would recommend switching pravastatin to Lipitor 40 mg as LDL is elevated.  Dispo: f/u 1 year.   Kate Sable, M.D., F.A.C.C.

## 2015-01-03 DIAGNOSIS — M25562 Pain in left knee: Secondary | ICD-10-CM | POA: Diagnosis not present

## 2015-01-03 DIAGNOSIS — M170 Bilateral primary osteoarthritis of knee: Secondary | ICD-10-CM | POA: Diagnosis not present

## 2015-01-03 DIAGNOSIS — M25561 Pain in right knee: Secondary | ICD-10-CM | POA: Diagnosis not present

## 2015-01-05 ENCOUNTER — Ambulatory Visit: Payer: Medicare Other

## 2015-01-06 ENCOUNTER — Ambulatory Visit: Payer: Medicare Other

## 2015-01-10 ENCOUNTER — Ambulatory Visit: Payer: Medicare Other

## 2015-01-11 ENCOUNTER — Ambulatory Visit (INDEPENDENT_AMBULATORY_CARE_PROVIDER_SITE_OTHER): Payer: Medicare Other | Admitting: Family Medicine

## 2015-01-11 ENCOUNTER — Telehealth: Payer: Self-pay | Admitting: Family Medicine

## 2015-01-11 ENCOUNTER — Ambulatory Visit (HOSPITAL_COMMUNITY)
Admission: RE | Admit: 2015-01-11 | Discharge: 2015-01-11 | Disposition: A | Discharge status: Home / Self Care | Payer: Medicare Other | Source: Ambulatory Visit | Attending: Family Medicine | Admitting: Family Medicine

## 2015-01-11 DIAGNOSIS — R0781 Pleurodynia: Secondary | ICD-10-CM

## 2015-01-11 DIAGNOSIS — Z7901 Long term (current) use of anticoagulants: Secondary | ICD-10-CM

## 2015-01-11 DIAGNOSIS — R079 Chest pain, unspecified: Secondary | ICD-10-CM

## 2015-01-11 DIAGNOSIS — D649 Anemia, unspecified: Secondary | ICD-10-CM | POA: Diagnosis not present

## 2015-01-11 DIAGNOSIS — I700 Atherosclerosis of aorta: Secondary | ICD-10-CM | POA: Diagnosis not present

## 2015-01-11 DIAGNOSIS — I2510 Atherosclerotic heart disease of native coronary artery without angina pectoris: Secondary | ICD-10-CM | POA: Diagnosis not present

## 2015-01-11 DIAGNOSIS — I517 Cardiomegaly: Secondary | ICD-10-CM | POA: Diagnosis not present

## 2015-01-11 LAB — POCT HEMOGLOBIN: HEMOGLOBIN: 11.5 g/dL — AB (ref 12.2–16.2)

## 2015-01-11 LAB — POCT INR: INR: 2.2

## 2015-01-11 MED ORDER — OXYCODONE HCL 5 MG PO CAPS: 5.0000 mg | ORAL_CAPSULE | Freq: Four times a day (QID) | ORAL | Status: DC | PRN

## 2015-01-11 NOTE — Telephone Encounter (Signed)
May have refill of this medicine for 120 tablets but also will need follow-up office visits with Dr. Richardson Landry every 3 months in regards to pain management

## 2015-01-11 NOTE — Telephone Encounter (Signed)
Rx printed and given to patient at office visit today with Dr Nicki Reaper.

## 2015-01-11 NOTE — Progress Notes (Signed)
   Subjective:    Patient ID: [name], female    DOB: [birth year], 74 y.o.   MRN: [id number]  HPIpt arrives today because of pain in right side/rib area. Hurts to take a deep breath. Patient relates that this started a few days ago she denies hemoptysis she denies sweats chills she denies fever no PND she has severe COPD and suffers with severe shortness of breath along with having to use 02. She denies nausea vomiting diarrhea fevers chills or recent illness denies any falls or injury. She denies any angina symptoms. PMH COPD hyperlipidemia she does have a remote history of pulmonary embolism  Patient is on Coumadin and is adequately anticoagulated  Review of Systems Patient relates pain discomfort chest hurts with certain movements see above    Objective:   Physical Exam INR in a good range Hemoglobin stable Neck no masses No crackles in the lungs lungs are clear Heart irregular rate is controlled extremities trace edema skin warm dry neurologic grossly normal Patient with significant rib pain discomfort on the right side palpation       Assessment & Plan:  We will need to do a stat x-ray of the chest as well as right ribs there is possibility of a fracture I doubt pneumothorax. I don't feel the patient has pulmonary embolism. I don't feel the patient is having cardiac. I doubt pneumonia. Prescription for pain control given Follow-up within 2 weeks if not doing better Await the results of these x-rays Complex situation 25 minutes spent with patient [phone]

## 2015-01-11 NOTE — Telephone Encounter (Signed)
Pt is needing a refill on her pain meds.

## 2015-01-12 ENCOUNTER — Telehealth: Payer: Self-pay | Admitting: Family Medicine

## 2015-01-12 NOTE — Telephone Encounter (Signed)
Patient got prescription for oxycodone 5mg  caps on 7/20 but she needs tablets  [facility] couldn't fill caps. Patient bringing back other prescription for new prescription today.

## 2015-01-13 ENCOUNTER — Other Ambulatory Visit: Payer: Self-pay | Admitting: *Deleted

## 2015-01-13 MED ORDER — OXYCODONE HCL 5 MG PO TABS: 5.0000 mg | ORAL_TABLET | ORAL | Status: DC | PRN

## 2015-01-13 NOTE — Telephone Encounter (Signed)
Script rewrote for tablets. Pt to come and get Monday.

## 2015-01-13 NOTE — Telephone Encounter (Signed)
Please call the patient. Find out what she needs. If she once it as tablets please refill as tablets. Find out where she gets her medicine. If she gets it locally I will help bring the prescription to the pharmacy if necessary.

## 2015-01-17 ENCOUNTER — Telehealth: Payer: Self-pay | Admitting: Family Medicine

## 2015-01-17 MED ORDER — OXYCODONE HCL 5 MG PO TABS: 5.0000 mg | ORAL_TABLET | ORAL | Status: DC | PRN

## 2015-01-17 NOTE — Telephone Encounter (Signed)
Rx up front for patient pick up. Patient notified. 

## 2015-01-17 NOTE — Telephone Encounter (Signed)
It would be fine to give the patient the necessary prescription she needs. Regardless of the other prescription in's up being found she can only get one filled

## 2015-01-17 NOTE — Telephone Encounter (Signed)
Patient has rx for oxycodone 5mg  #120 capsules but pharmacy out of cap and need new rx tablets but cant find other rx to trade out

## 2015-01-17 NOTE — Telephone Encounter (Signed)
Pt called stating that she was told to bring her old prescription for her oxycodone capsules back to get a prescription for the tablet form of oxycodone. Pt has two pills left and cant find the other prescription to bring back. Pt wants to know what she should do.

## 2015-02-06 ENCOUNTER — Other Ambulatory Visit: Payer: Self-pay | Admitting: Family Medicine

## 2015-02-06 NOTE — Telephone Encounter (Signed)
May refill this +4 additional

## 2015-02-08 ENCOUNTER — Ambulatory Visit: Payer: Medicare Other

## 2015-02-15 ENCOUNTER — Ambulatory Visit: Payer: Medicare Other

## 2015-02-15 ENCOUNTER — Ambulatory Visit (INDEPENDENT_AMBULATORY_CARE_PROVIDER_SITE_OTHER): Payer: Medicare Other

## 2015-02-15 DIAGNOSIS — Z7901 Long term (current) use of anticoagulants: Secondary | ICD-10-CM | POA: Diagnosis not present

## 2015-02-15 LAB — POCT INR: INR: 2.1

## 2015-02-15 NOTE — Patient Instructions (Signed)
Coumadin 5MG : Take 1 tablet Sun, Tuesday, Thursday, Friday,Saturday and 1/2 tablet on Monday and Wednesday.

## 2015-02-16 ENCOUNTER — Ambulatory Visit (INDEPENDENT_AMBULATORY_CARE_PROVIDER_SITE_OTHER): Payer: Medicare Other | Admitting: Family Medicine

## 2015-02-16 ENCOUNTER — Encounter: Payer: Self-pay | Admitting: Family Medicine

## 2015-02-16 VITALS — BP 108/64 | Temp 98.3°F | Ht 61.0 in | Wt 208.0 lb

## 2015-02-16 DIAGNOSIS — M546 Pain in thoracic spine: Secondary | ICD-10-CM | POA: Diagnosis not present

## 2015-02-16 DIAGNOSIS — R079 Chest pain, unspecified: Secondary | ICD-10-CM | POA: Diagnosis not present

## 2015-02-16 DIAGNOSIS — I2510 Atherosclerotic heart disease of native coronary artery without angina pectoris: Secondary | ICD-10-CM | POA: Diagnosis not present

## 2015-02-16 MED ORDER — TIZANIDINE HCL 4 MG PO TABS: 4.0000 mg | ORAL_TABLET | Freq: Two times a day (BID) | ORAL | Status: DC | PRN

## 2015-02-16 MED ORDER — OXYCODONE HCL 10 MG PO TABS: ORAL_TABLET | ORAL | Status: DC

## 2015-02-16 NOTE — Progress Notes (Signed)
   Subjective:    Patient ID: [name], female    DOB: Mar 16, 1941, 74 y.o.   MRN: [id number]  HPIPain upper back on right side. Pain under right shoulder blade. Pain when taking a deep breath in chest and back.  Started about 2 weeks ago. Taking tylenol and oxycodone without relief.   Did a lot of lifting and movement several wks ago    No sig wheezing with it  Review of Systems No headache no chest pain no wheezing no fever no cough    Objective:   Physical Exam  Alert mild malaise. Lungs clear no wheezes heart rare rhythm positive paraspinal and mid upper right thoracic tenderness to deep palpation some spinal tenderness but not a lot.      Assessment & Plan:  Impression probable muscle strain spasm however with history of fracture vertebral we will do x-ray plan x-rays pain medication discussed local measures discussed WSL

## 2015-03-11 ENCOUNTER — Other Ambulatory Visit: Payer: Self-pay | Admitting: Family Medicine

## 2015-03-11 ENCOUNTER — Other Ambulatory Visit: Payer: Self-pay | Admitting: Cardiovascular Disease

## 2015-03-17 ENCOUNTER — Ambulatory Visit: Payer: Medicare Other

## 2015-03-24 ENCOUNTER — Ambulatory Visit (INDEPENDENT_AMBULATORY_CARE_PROVIDER_SITE_OTHER): Payer: Medicare Other | Admitting: *Deleted

## 2015-03-24 DIAGNOSIS — Z7901 Long term (current) use of anticoagulants: Secondary | ICD-10-CM | POA: Diagnosis not present

## 2015-03-24 DIAGNOSIS — Z23 Encounter for immunization: Secondary | ICD-10-CM

## 2015-03-24 LAB — POCT INR: INR: 1.8

## 2015-04-10 ENCOUNTER — Other Ambulatory Visit: Payer: Self-pay | Admitting: Family Medicine

## 2015-04-10 ENCOUNTER — Encounter: Payer: Self-pay | Admitting: Family Medicine

## 2015-04-10 ENCOUNTER — Ambulatory Visit (INDEPENDENT_AMBULATORY_CARE_PROVIDER_SITE_OTHER): Payer: Medicare Other | Admitting: Family Medicine

## 2015-04-10 VITALS — BP 128/72 | Temp 99.3°F | Ht 61.0 in | Wt 207.0 lb

## 2015-04-10 DIAGNOSIS — M1711 Unilateral primary osteoarthritis, right knee: Secondary | ICD-10-CM | POA: Diagnosis not present

## 2015-04-10 DIAGNOSIS — J4531 Mild persistent asthma with (acute) exacerbation: Secondary | ICD-10-CM | POA: Diagnosis not present

## 2015-04-10 DIAGNOSIS — I4819 Other persistent atrial fibrillation: Secondary | ICD-10-CM

## 2015-04-10 DIAGNOSIS — I481 Persistent atrial fibrillation: Secondary | ICD-10-CM

## 2015-04-10 DIAGNOSIS — I2510 Atherosclerotic heart disease of native coronary artery without angina pectoris: Secondary | ICD-10-CM

## 2015-04-10 DIAGNOSIS — M546 Pain in thoracic spine: Secondary | ICD-10-CM

## 2015-04-10 MED ORDER — PREDNISONE 20 MG PO TABS: ORAL_TABLET | ORAL | Status: DC

## 2015-04-10 MED ORDER — OXYCODONE HCL 10 MG PO TABS: ORAL_TABLET | ORAL | Status: DC

## 2015-04-10 MED ORDER — CEFDINIR 300 MG PO CAPS: 300.0000 mg | ORAL_CAPSULE | Freq: Two times a day (BID) | ORAL | Status: DC

## 2015-04-10 MED ORDER — OXYCODONE HCL 5 MG PO TABS: 5.0000 mg | ORAL_TABLET | ORAL | Status: DC | PRN

## 2015-04-10 MED ORDER — OXYCODONE HCL 10 MG PO TABS
ORAL_TABLET | ORAL | Status: DC
Start: 2015-04-10 — End: 2015-04-10

## 2015-04-10 NOTE — Progress Notes (Signed)
   Subjective:  Patient presents with 3 distinct concerns    Patient ID: [name], female    DOB: December 24, 1940, 74 y.o.   MRN: [id number]  HPIShortness of breath. Started 4 days ago. Low grade fever, congestion, sinus drainage. This is flared up or wheezing. Frontal headache. Using albuterol intermittently.   needs refill on oxycodone. Requires to 10 mg tablets per day. In order to help her chronic back and knee pain. States she definitely needs it.  Patient very frustrated by ongoing fatigue. Wonders about a B-12 injection. His been told by family members that she needs it. Has literally been tired for years. Claims no depression.   and flovent. Compliant with  Review of Systems No headache no chest pain positive cough positive low-grade fever diminished energy complete ROS otherwise negative    Objective:   Physical Exam Alert vital stable blood pressure. H&T moderate his congestion frontal tenderness trace normal lungs bilateral wheezes mild in nature no tachypnea no inspiratory crackles low back pain significant crepitations both knees. Mental status somewhat tearful claims no significant depression       Assessment & Plan:  Impression 1 chronic fatigue discussed at length. We just did B-12 the spring. Advised against that. Exercise encourage #2 flare of asthma discussed #3 chronic back pain and knee pain ongoing. Plan 25 minutes spent most in discussion steroid taper. Antibiotics prescribed. Pain medicines refilled. Recheck in 3 months. Diet exercise discussed WSL

## 2015-04-11 ENCOUNTER — Telehealth: Payer: Self-pay | Admitting: Family Medicine

## 2015-04-11 MED ORDER — AMOXICILLIN-POT CLAVULANATE 875-125 MG PO TABS: ORAL_TABLET | ORAL | Status: DC

## 2015-04-11 NOTE — Telephone Encounter (Signed)
Called patient and informed her per Dr.Steve Luking that Augmentin 825 1 tablet twice a day for 10 days was sent into pharmacy. Patient verbalized understanding.

## 2015-04-11 NOTE — Telephone Encounter (Signed)
Patient states that she had Augmentin back in February and would like to get that if possible or a Z Pak.

## 2015-04-11 NOTE — Telephone Encounter (Signed)
Ask her what abx she wants

## 2015-04-11 NOTE — Telephone Encounter (Signed)
Patient was seen yesterday and prescribed omnicef 300 mg and it causes diarreah and she states she has this off/on already and doesn't want anything to make it start up again. Can you call in something else [facility] and she wants it delivered please

## 2015-04-11 NOTE — Telephone Encounter (Signed)
Aug 875 bid ten d 

## 2015-04-17 ENCOUNTER — Telehealth: Payer: Self-pay | Admitting: Family Medicine

## 2015-04-17 MED ORDER — AMOXICILLIN 500 MG PO CAPS: 500.0000 mg | ORAL_CAPSULE | Freq: Three times a day (TID) | ORAL | Status: DC

## 2015-04-17 NOTE — Telephone Encounter (Signed)
Rx sent electronically to pharmacy. Patient notified. 

## 2015-04-17 NOTE — Telephone Encounter (Signed)
chanfge to plain amox 500 tid 7 d

## 2015-04-17 NOTE — Telephone Encounter (Signed)
Patient says that her diarrhea has been a lot of worse since she was prescribed amoxicillin-clavulanate (AUGMENTIN) 875-125 MG tablet the other day.  She wants to know if we can send in a new medication ASAP.  Marydel

## 2015-04-21 ENCOUNTER — Ambulatory Visit (INDEPENDENT_AMBULATORY_CARE_PROVIDER_SITE_OTHER): Payer: Medicare Other | Admitting: *Deleted

## 2015-04-21 DIAGNOSIS — Z7901 Long term (current) use of anticoagulants: Secondary | ICD-10-CM | POA: Diagnosis not present

## 2015-04-21 LAB — POCT INR: INR: 2.7

## 2015-04-28 ENCOUNTER — Encounter: Payer: Self-pay | Admitting: Family Medicine

## 2015-04-28 ENCOUNTER — Ambulatory Visit (INDEPENDENT_AMBULATORY_CARE_PROVIDER_SITE_OTHER): Payer: Medicare Other | Admitting: Family Medicine

## 2015-04-28 VITALS — Temp 98.9°F | Ht 61.0 in | Wt 207.0 lb

## 2015-04-28 DIAGNOSIS — I2510 Atherosclerotic heart disease of native coronary artery without angina pectoris: Secondary | ICD-10-CM | POA: Diagnosis not present

## 2015-04-28 DIAGNOSIS — J209 Acute bronchitis, unspecified: Secondary | ICD-10-CM

## 2015-04-28 DIAGNOSIS — J4520 Mild intermittent asthma, uncomplicated: Secondary | ICD-10-CM | POA: Diagnosis not present

## 2015-04-28 DIAGNOSIS — Z7901 Long term (current) use of anticoagulants: Secondary | ICD-10-CM

## 2015-04-28 LAB — POCT INR: INR: 2.1

## 2015-04-28 MED ORDER — DOXYCYCLINE HYCLATE 100 MG PO CAPS: 100.0000 mg | ORAL_CAPSULE | Freq: Two times a day (BID) | ORAL | Status: DC

## 2015-04-28 MED ORDER — PREDNISONE 20 MG PO TABS: ORAL_TABLET | ORAL | Status: DC

## 2015-04-28 NOTE — Patient Instructions (Signed)
If fevers or worse follow up please

## 2015-04-28 NOTE — Progress Notes (Signed)
   Subjective:    Patient ID: [name], female    DOB: 1941/03/19, 74 y.o.   MRN: [id number]  Fever  This is a new problem. The current episode started today. The problem occurs intermittently. The problem has been unchanged. Associated symptoms include diarrhea and vomiting. Associated symptoms comments: Runny nose. Treatments tried: neb treatment. The treatment provided no relief.    she relates fever coughing not feeling good in addition to this congestion and wheezing some shortness of breath.   Review of Systems  Constitutional: Positive for fever.  Gastrointestinal: Positive for vomiting and diarrhea.    patient relates coughing relates congestion wheezing feeling bad low-grade fevers    Objective:   Physical Exam  eardrums normal throat normal neck supple lungs are bilateral expiratory wheezes not respiratory distress heart regular cough noted no crackles noted   she states she has been having some sweats at night time  INR stable    Assessment & Plan:   probable sinusitis in addition to this acute bronchitis with asthma flareup prednisone taper albuterol frequently antibiotics prescribed   patient was told if she is not feeling 100% better over the next 2 weeks to follow-up

## 2015-05-10 ENCOUNTER — Other Ambulatory Visit: Payer: Self-pay | Admitting: Adult Health

## 2015-05-17 ENCOUNTER — Ambulatory Visit (INDEPENDENT_AMBULATORY_CARE_PROVIDER_SITE_OTHER): Payer: Medicare Other | Admitting: Internal Medicine

## 2015-05-17 ENCOUNTER — Encounter (INDEPENDENT_AMBULATORY_CARE_PROVIDER_SITE_OTHER): Payer: Self-pay | Admitting: Internal Medicine

## 2015-05-17 VITALS — BP 110/58 | HR 60 | Temp 98.9°F | Ht 61.0 in | Wt 203.4 lb

## 2015-05-17 DIAGNOSIS — I2510 Atherosclerotic heart disease of native coronary artery without angina pectoris: Secondary | ICD-10-CM | POA: Diagnosis not present

## 2015-05-17 DIAGNOSIS — R197 Diarrhea, unspecified: Secondary | ICD-10-CM | POA: Diagnosis not present

## 2015-05-17 DIAGNOSIS — K589 Irritable bowel syndrome without diarrhea: Secondary | ICD-10-CM

## 2015-05-17 NOTE — Patient Instructions (Addendum)
GI pathogen

## 2015-05-17 NOTE — Progress Notes (Signed)
Subjective:    Patient ID: [name], female    DOB: Nov 18, 1940, 74 y.o.   MRN: [id number]  HPI Presents today with c/o that she was treated for bronchitis about 3 weeks ago. She was treated with Augmentin, Amoxicillin and Doxycycline. She says she developed diarrhea while taking the antibiotics. Her stools were green. She was having 2-3 stools a day.  She has not had a BM today.   She takes Lomotil daily for Irritable Bowel Syndrome. She says she feels weak from being sick.  Appetite is getting better since she had bronchitis.  She has lost about 13 pounds since her last visit in March.      05/10/2014 Colonoscopy  Indications: Patient is 74 year old Caucasian female who presents with melena and anemia. Patient is chronically anticoagulated. She was scheduled to undergo elective colonoscopy on 05/11/2014 and was being bridged with Lovenox while warfarin was on hold. She underwent diagnostic EGD which revealed no bleeding source and gastric ulcer had completely healed. She is therefore undergoing diagnostic colonoscopy. He also has chronic diarrhea felt to be due to IBS. She has lost 10 pounds since March.   Impression:  Examination performed to cecum. 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 These lesions were covered with fresh blood and clot. These lesions were ablated with argon plasma coagulator. Single diverticulum at ascending colon with few more at sigmoid colon. 4 mm polyp ablated via cold biopsy from sigmoid colon. Random biopsies taken from normal appearing mucosa of sigmoid colon given history of chronic diarrhea Small external hemorrhoids.  Biopsy from sigmoid colon mucosa negative for microscopic colitis. Polyp is a tubular adenoma. Results reviewed with patient.  05/09/2014 EGD  Indications: Patient is 74 year old Caucasian female who presents with melena. She is anticoagulated because of history of pulmonary embolism.  She was found to have prepyloric ulcer back in May 2015. She is undergoing diagnostic EGD.  Complications: none  Impression: Gastric ulcer( May 2015) has completely healed. Two antral erosions. No bleeding lesions identified on this exam Review of Systems Past Medical History  Diagnosis Date  . ASCVD (arteriosclerotic cardiovascular disease)     stent to mid and proximal left anterior descending in 06/2002;drug eluting stent placed in the second diagnol in 08/2003 after  A  non-st elevation myocardial infarction   . Tobacco user     stopped   . Hyperlipidemia     pulmonary embolism 2000 and 09/2008  . Hypertension   . Pulmonary embolism ([place])     2000/09/2008  . Thyroid disease     hypothyroidism  . Allergy history unknown   . GERD (gastroesophageal reflux disease)   . Diabetes mellitus     no insulin  . Asthma   . FH: colonic polyps     adenomataous  . Anticoagulated   . Diarrhea     acute  . Rectal bleeding   . Pedal edema   . Irritable bowel syndrome   . Coronary artery disease   . Paroxysmal atrial fibrillation ([facility])     normal LV function; episodes occurred in 205 and 09/2007  . Hypothyroidism   . Peripheral edema   . Allergy   . Atrial fibrillation ([place])   . Chronic anticoagulation   . Venous stasis   . Insomnia   . Chronic pain of left knee   . Diarrhea     Past Surgical History  Procedure Laterality Date  . Cleft palate repair  4 surgeries  . Abdominal hysterectomy    . Appendectomy    . Cholecystectomy    . Heel spur surgery    . Knee surgery    . Breast reduction surgery    . Colonoscopy  2011    negative  . Oophorectomy    . Breast surgery    . Esophagogastroduodenoscopy N/A 11/11/2013    Procedure: ESOPHAGOGASTRODUODENOSCOPY (EGD);  Surgeon: Rogene Houston, MD;  [facility];  Service: Endoscopy;  Laterality: N/A;  200-moved to  100 Ann notified pt  . Balloon dilation N/A 11/11/2013    Procedure: BALLOON DILATION;  Surgeon: Rogene Houston, MD;  [facility]: [facility];  Service: Endoscopy;  Laterality: N/A;  Venia Minks dilation N/A 11/11/2013    Procedure: Venia Minks DILATION;  Surgeon: Rogene Houston, MD;  [facility];  Service: Endoscopy;  Laterality: N/A;  . Savory dilation N/A 11/11/2013    Procedure: SAVORY DILATION;  Surgeon: Rogene Houston, MD;  [facility];  Service: Endoscopy;  Laterality: N/A;  . Esophagogastroduodenoscopy N/A 05/09/2014    Procedure: ESOPHAGOGASTRODUODENOSCOPY (EGD);  Surgeon: Rogene Houston, MD;  [facility];  Service: Endoscopy;  Laterality: N/A;  . Colonoscopy N/A 05/10/2014    Procedure: COLONOSCOPY;  Surgeon: Rogene Houston, MD;  [facility];  Service: Endoscopy;  Laterality: N/A;    Allergies  Allergen Reactions  . Demerol Nausea And Vomiting    Current Outpatient Prescriptions on File Prior to Visit  Medication Sig Dispense Refill  . acetaminophen (TYLENOL) 500 MG tablet Take 500 mg by mouth daily as needed for headache.    . albuterol (PROVENTIL) (2.5 MG/3ML) 0.083% nebulizer solution Take 3 mLs (2.5 mg total) by nebulization every 6 (six) hours as needed for wheezing or shortness of breath. 75 mL 5  . atorvastatin (LIPITOR) 40 MG tablet Take 1 tablet (40 mg total) by mouth daily. 90 tablet 3  . Cholecalciferol (VITAMIN D-3) 5000 UNITS TABS Take 2 tablets by mouth daily.     Marland Kitchen diltiazem (CARDIZEM CD) 180 MG 24 hr capsule TAKE 1 CAPSULE BY MOUTH ONCE A DAY. 30 capsule 11  . diphenoxylate-atropine (LOMOTIL) 2.5-0.025 MG per tablet TAKE 1 TABLET 2 TIMES A DAY AS NEEDED FOR DIARRHEA OR LOOSE STOOL. 60 tablet 4  . doxycycline (VIBRAMYCIN) 100 MG capsule Take 1 capsule (100 mg total) by mouth 2 (two) times daily. 20 capsule 0  . escitalopram (LEXAPRO) 20 MG tablet TAKE (1) TABLET BY MOUTH ONCE DAILY. 30 tablet 4  . FLOVENT HFA 110 MCG/ACT  inhaler INHALE 2 PUFFS TWICE DAILY. 12 g 0  . levothyroxine (SYNTHROID, LEVOTHROID) 50 MCG tablet TAKE 1 TABLET ONCE DAILY. 30 tablet 5  . ondansetron (ZOFRAN-ODT) 4 MG disintegrating tablet Take 4 mg by mouth every 8 (eight) hours as needed for nausea or vomiting.    . Oxycodone HCl 10 MG TABS Take one tablet up to qid prn pain 60 tablet 0  . pantoprazole (PROTONIX) 40 MG tablet Take 1 tablet (40 mg total) by mouth 2 (two) times daily before a meal. (Patient taking differently: Take 40 mg by mouth 2 (two) times daily. ) 60 tablet 3  . predniSONE (DELTASONE) 20 MG tablet Take 3 tabs for 3 days, take 2 tabs for 3 days, then one tab for 2 days. 17 tablet 0  . PROAIR HFA 108 (90 BASE) MCG/ACT inhaler INHALE 2 PUFFS UP TO FOUR TIMES DAILY AS NEEDED FOR WHEEZING. 8.5  g 5  . promethazine (PHENERGAN) 25 MG suppository Place 1 suppository (25 mg total) rectally every 6 (six) hours as needed for nausea or vomiting. 12 suppository 0  . rifaximin (XIFAXAN) 550 MG TABS tablet Take 1 tablet (550 mg total) by mouth 3 (three) times daily. 42 tablet 0  . tiZANidine (ZANAFLEX) 4 MG tablet Take 1 tablet (4 mg total) by mouth 2 (two) times daily as needed for muscle spasms. 28 tablet 0  . valsartan (DIOVAN) 320 MG tablet TAKE (1) TABLET BY MOUTH ONCE DAILY. 30 tablet 11  . warfarin (COUMADIN) 5 MG tablet TAKE 1/2 TABLET ON TUESDAY AND FRIDAY THEN 1 TABLET ALL OTHER DAYS. 30 tablet 0  . [DISCONTINUED] ferrous sulfate 325 (65 FE) MG tablet Take 1 tablet (325 mg total) by mouth daily with breakfast. IRON SUPPLEMENT.    . [DISCONTINUED] ipratropium (ATROVENT) 0.02 % nebulizer solution Take 500 mcg by nebulization every 4 (four) hours as needed. For shortness of breath     No current facility-administered medications on file prior to visit.         Objective:   Physical Exam Blood pressure 110/58, pulse 60, temperature 98.9 F (37.2 C), height 5\' 1"  (1.549 m), weight 203 lb 6.4 oz (92.262 kg). Alert and oriented.  Skin warm and dry. Oral mucosa is moist.   . Sclera anicteric, conjunctivae is pink. Thyroid not enlarged. No cervical lymphadenopathy. Lungs clear. Heart regular rate and rhythm.  Abdomen is soft. Bowel sounds are positive. No hepatomegaly. No abdominal masses felt. No tenderness.  No edema to lower extremities.         Assessment & Plan:  Diarrhea. Chronic. Take the Lomotil as needed. Will get GI pathogen. Further recommendations to follow.

## 2015-05-19 ENCOUNTER — Ambulatory Visit: Payer: Medicare Other

## 2015-05-23 ENCOUNTER — Ambulatory Visit: Payer: Medicare Other

## 2015-05-24 ENCOUNTER — Ambulatory Visit (INDEPENDENT_AMBULATORY_CARE_PROVIDER_SITE_OTHER): Payer: Medicare Other | Admitting: Family Medicine

## 2015-05-24 VITALS — Temp 98.4°F

## 2015-05-24 DIAGNOSIS — Z7901 Long term (current) use of anticoagulants: Secondary | ICD-10-CM | POA: Diagnosis not present

## 2015-05-24 LAB — POCT INR: INR: 2.1

## 2015-05-24 MED ORDER — CEFPROZIL 250 MG PO TABS: 250.0000 mg | ORAL_TABLET | Freq: Two times a day (BID) | ORAL | Status: DC

## 2015-05-24 MED ORDER — PREDNISONE 20 MG PO TABS: ORAL_TABLET | ORAL | Status: DC

## 2015-05-24 MED ORDER — ALBUTEROL SULFATE (2.5 MG/3ML) 0.083% IN NEBU: 2.5000 mg | INHALATION_SOLUTION | Freq: Four times a day (QID) | RESPIRATORY_TRACT | Status: DC | PRN

## 2015-05-24 NOTE — Progress Notes (Signed)
   Subjective:    Patient ID: [name], female    DOB: 1940/11/29, 74 y.o.   MRN: [id number]  HPI Patient relates several days of head congestion drainage coughing in addition to this some intermittent wheezing in some symptoms of feeling short of breath she denies high fever chills next neck stiffness nausea vomiting or diarrhea PMH COPD frequent exacerbations   Review of Systems See above. Denies vomiting diarrhea.    Objective:   Physical Exam  Lungs minimal expiratory wheezes not respiratory distress HEENT benign moderate sinus tenderness rest rate is normal  This patient was seen for a INR check in at the end of the visit she stated she was having wheezing difficulty breathing unwanted to be seen she was worked into the schedule as a doctor visit as well [id number]    Assessment & Plan:  Early COPD flareup prednisone prescribed albuterol on a frequent basis Probable sinus infection antibiotics prescribed

## 2015-05-24 NOTE — Patient Instructions (Signed)
Coumadin 5 mg ablets.  Take one half tablet on Monday and Wednesday and one tablet all other days. Recheck in 4 weeks

## 2015-05-24 NOTE — Addendum Note (Signed)
Addended by: Sallee Lange A on: 05/24/2015 04:41 PM   Modules accepted: Orders, Level of Service

## 2015-05-29 DIAGNOSIS — R197 Diarrhea, unspecified: Secondary | ICD-10-CM | POA: Diagnosis not present

## 2015-05-29 DIAGNOSIS — K589 Irritable bowel syndrome without diarrhea: Secondary | ICD-10-CM | POA: Diagnosis not present

## 2015-05-31 LAB — GASTROINTESTINAL PATHOGEN PANEL PCR
C. DIFFICILE TOX A/B, PCR: NEGATIVE
CRYPTOSPORIDIUM, PCR: NEGATIVE
Campylobacter, PCR: NEGATIVE
E COLI (ETEC) LT/ST, PCR: NEGATIVE
E COLI (STEC) STX1/STX2, PCR: NEGATIVE
E COLI 0157, PCR: NEGATIVE
Giardia lamblia, PCR: NEGATIVE
Norovirus, PCR: NEGATIVE
ROTAVIRUS, PCR: NEGATIVE
SALMONELLA, PCR: NEGATIVE
Shigella, PCR: NEGATIVE

## 2015-06-03 ENCOUNTER — Other Ambulatory Visit: Payer: Self-pay | Admitting: Family Medicine

## 2015-06-05 ENCOUNTER — Encounter (INDEPENDENT_AMBULATORY_CARE_PROVIDER_SITE_OTHER): Payer: Self-pay | Admitting: Internal Medicine

## 2015-06-08 ENCOUNTER — Other Ambulatory Visit: Payer: Self-pay | Admitting: Family Medicine

## 2015-06-09 ENCOUNTER — Ambulatory Visit (INDEPENDENT_AMBULATORY_CARE_PROVIDER_SITE_OTHER): Payer: Medicare Other | Admitting: Nurse Practitioner

## 2015-06-09 VITALS — BP 138/84 | Temp 98.3°F | Ht 61.0 in | Wt 210.0 lb

## 2015-06-09 DIAGNOSIS — I2510 Atherosclerotic heart disease of native coronary artery without angina pectoris: Secondary | ICD-10-CM | POA: Diagnosis not present

## 2015-06-09 DIAGNOSIS — E2839 Other primary ovarian failure: Secondary | ICD-10-CM

## 2015-06-12 ENCOUNTER — Encounter: Payer: Self-pay | Admitting: Nurse Practitioner

## 2015-06-12 NOTE — Progress Notes (Signed)
Subjective:  Presents for c/o vaginal itching and a "knot" just inside the vagina that began 3 days ago. Itching is basically resolved. Knot could not be felt this am. Has had hysterectomy with BSO. No pelvic pain. No discharge.   Objective:   BP 138/84 mmHg  Temp(Src) 98.3 F (36.8 C) (Oral)  Ht 5\' 1"  (1.549 m)  Wt 210 lb (95.255 kg)  BMI 39.70 kg/m2 NAD. Alert, oriented, external GU: no rashes or lesions. Vagina: pale, slightly dry; no lesions, discharge or masses. No evidence of cystocele or rectocele.   Assessment: Hypoestrogenism  Plan: patient was constipated at the time she felt hard area which is the most likely cause. Recheck if any further problems. Otherwise, routine follow up.

## 2015-06-13 ENCOUNTER — Ambulatory Visit (INDEPENDENT_AMBULATORY_CARE_PROVIDER_SITE_OTHER): Payer: Medicare Other | Admitting: Family Medicine

## 2015-06-13 ENCOUNTER — Encounter: Payer: Self-pay | Admitting: Family Medicine

## 2015-06-13 VITALS — BP 124/82 | Temp 97.7°F | Ht 61.0 in | Wt 208.5 lb

## 2015-06-13 DIAGNOSIS — J069 Acute upper respiratory infection, unspecified: Secondary | ICD-10-CM | POA: Diagnosis not present

## 2015-06-13 DIAGNOSIS — I2510 Atherosclerotic heart disease of native coronary artery without angina pectoris: Secondary | ICD-10-CM

## 2015-06-13 DIAGNOSIS — J4541 Moderate persistent asthma with (acute) exacerbation: Secondary | ICD-10-CM

## 2015-06-13 MED ORDER — BUDESONIDE-FORMOTEROL FUMARATE 160-4.5 MCG/ACT IN AERO: 2.0000 | INHALATION_SPRAY | Freq: Two times a day (BID) | RESPIRATORY_TRACT | Status: DC

## 2015-06-13 MED ORDER — ALBUTEROL SULFATE (2.5 MG/3ML) 0.083% IN NEBU
2.5000 mg | INHALATION_SOLUTION | Freq: Once | RESPIRATORY_TRACT | Status: AC
  Administered 2015-06-13: 2.5 mg via RESPIRATORY_TRACT

## 2015-06-13 MED ORDER — AMOXICILLIN 500 MG PO TABS: 500.0000 mg | ORAL_TABLET | Freq: Three times a day (TID) | ORAL | Status: DC

## 2015-06-13 MED ORDER — PREDNISONE 20 MG PO TABS: ORAL_TABLET | ORAL | Status: DC

## 2015-06-13 NOTE — Progress Notes (Signed)
   Subjective:    Patient ID: [name], female    DOB: 11-04-1940, 74 y.o.   MRN: [id number]  Wheezing  This is a new problem. The current episode started in the past 7 days. Associated symptoms include coughing, rhinorrhea and shortness of breath. Pertinent negatives include no chest pain, ear pain or fever. She has tried steroid inhaler (Albuterol Nebulizer) for the symptoms.   Patient states no other concerns this visit.  patient with significant head congestion drainage coughing not feeling good in addition to this chest congestion wheezing coughing coughing up clear phlegm no fever or chills  Review of Systems  Constitutional: Negative for fever and activity change.  HENT: Positive for congestion and rhinorrhea. Negative for ear pain.   Eyes: Negative for discharge.  Respiratory: Positive for cough, shortness of breath and wheezing.   Cardiovascular: Negative for chest pain.  O2 sat 93     Objective:   Physical Exam  Constitutional: She appears well-developed.  HENT:  Head: Normocephalic.  Nose: Nose normal.  Mouth/Throat: Oropharynx is clear and moist. No oropharyngeal exudate.  Neck: Neck supple.  Cardiovascular: Normal rate and normal heart sounds.   No murmur heard. Pulmonary/Chest: Effort normal. She has wheezes.  Lymphadenopathy:    She has no cervical adenopathy.  Skin: Skin is warm and dry.  Nursing note and vitals reviewed.  patient not toxic not respiratory distress O2 saturation 93%    nebulizer a treatment given significant improvement afterwards      Assessment & Plan:   significant asthma flareup. Switch of medications. Stop Flovent. Start Symbicort. In addition to this prednisone taper also antibiotics prescribed that she can start if she starts having trouble with discolored mucus she was told if she does start antibiotic she needs to  Follow-up here within 1 week to check INR.

## 2015-06-16 ENCOUNTER — Other Ambulatory Visit: Payer: Self-pay | Admitting: Family Medicine

## 2015-06-16 NOTE — Telephone Encounter (Signed)
wis three ref

## 2015-06-21 ENCOUNTER — Ambulatory Visit (INDEPENDENT_AMBULATORY_CARE_PROVIDER_SITE_OTHER): Payer: Medicare Other | Admitting: *Deleted

## 2015-06-21 DIAGNOSIS — Z7901 Long term (current) use of anticoagulants: Secondary | ICD-10-CM

## 2015-06-21 LAB — POCT INR: INR: 3.4

## 2015-06-21 NOTE — Patient Instructions (Signed)
Coumadin 5mg . Take one half tablet on Monday, Wednesday and Friday. Take one all days. Recheck in 4 weeks.

## 2015-07-05 ENCOUNTER — Ambulatory Visit: Payer: Medicare Other | Admitting: Family Medicine

## 2015-07-13 DIAGNOSIS — J45998 Other asthma: Secondary | ICD-10-CM | POA: Diagnosis not present

## 2015-07-14 ENCOUNTER — Telehealth: Payer: Self-pay | Admitting: Family Medicine

## 2015-07-14 ENCOUNTER — Other Ambulatory Visit: Payer: Self-pay | Admitting: *Deleted

## 2015-07-14 MED ORDER — WARFARIN SODIUM 5 MG PO TABS: ORAL_TABLET | ORAL | Status: DC

## 2015-07-14 MED ORDER — LEVOTHYROXINE SODIUM 50 MCG PO TABS: 50.0000 ug | ORAL_TABLET | Freq: Every day | ORAL | Status: DC

## 2015-07-14 MED ORDER — ALBUTEROL SULFATE HFA 108 (90 BASE) MCG/ACT IN AERS: INHALATION_SPRAY | RESPIRATORY_TRACT | Status: DC

## 2015-07-14 MED ORDER — BUDESONIDE-FORMOTEROL FUMARATE 160-4.5 MCG/ACT IN AERO: 2.0000 | INHALATION_SPRAY | Freq: Two times a day (BID) | RESPIRATORY_TRACT | Status: DC

## 2015-07-14 MED ORDER — ESCITALOPRAM OXALATE 20 MG PO TABS: ORAL_TABLET | ORAL | Status: DC

## 2015-07-14 MED ORDER — DIPHENOXYLATE-ATROPINE 2.5-0.025 MG PO TABS: 1.0000 | ORAL_TABLET | Freq: Two times a day (BID) | ORAL | Status: DC | PRN

## 2015-07-14 NOTE — Telephone Encounter (Signed)
It is okay to refill her medicines for one month with 2 additional refills, please stick with what is on Epic list

## 2015-07-14 NOTE — Telephone Encounter (Signed)
Refills sent to pharm. Pt notified on vm

## 2015-07-14 NOTE — Telephone Encounter (Signed)
Patient is changing pharmacies from [facility] to [facility].  She is requesting that all her medications be sent over because [facility] is refusing to send over a list because of a balance she owes.  escitalopram (LEXAPRO) 20 MG tablet levothyroxine (SYNTHROID, LEVOTHROID) 50 MCG tablet diltiazem (CARDIZEM CD) 180 MG 24 hr capsule warfarin (COUMADIN) 5 MG tablet PROAIR HFA 108 (90 BASE) MCG/ACT inhaler budesonide-formoterol (SYMBICORT) 160-4.5 MCG/ACT inhaler diphenoxylate-atropine (LOMOTIL) 2.5-0.025 MG tablet  She believes these are all the medications that we prescribe her, but she wants the nurse to verify.  [facility]

## 2015-07-14 NOTE — Telephone Encounter (Signed)
But would like refills on rest- ok to refill including Lomotil

## 2015-07-14 NOTE — Telephone Encounter (Signed)
cardizem  Is filled by cardiologist and does not need [place] to refill

## 2015-07-19 ENCOUNTER — Telehealth: Payer: Self-pay | Admitting: Family Medicine

## 2015-07-19 ENCOUNTER — Ambulatory Visit (INDEPENDENT_AMBULATORY_CARE_PROVIDER_SITE_OTHER): Payer: Medicare HMO | Admitting: *Deleted

## 2015-07-19 DIAGNOSIS — Z7901 Long term (current) use of anticoagulants: Secondary | ICD-10-CM | POA: Diagnosis not present

## 2015-07-19 LAB — POCT INR: INR: 2.6

## 2015-07-19 MED ORDER — OXYCODONE HCL 10 MG PO TABS: ORAL_TABLET | ORAL | Status: DC

## 2015-07-19 NOTE — Telephone Encounter (Signed)
Pt is needing a refill on her oxycodone. Pt is here now for an inr check.

## 2015-07-19 NOTE — Telephone Encounter (Signed)
Ok per dr Nicki Reaper. Script given to pt at nurse visit.

## 2015-07-19 NOTE — Patient Instructions (Signed)
Coumadin 5mg  take one tablet every day except take one half tablet on mon, wed, and Friday. Recheck in 4 weeks.

## 2015-07-28 ENCOUNTER — Telehealth: Payer: Self-pay | Admitting: Family Medicine

## 2015-07-28 ENCOUNTER — Other Ambulatory Visit: Payer: Self-pay | Admitting: Cardiovascular Disease

## 2015-07-28 MED ORDER — ATORVASTATIN CALCIUM 40 MG PO TABS: 40.0000 mg | ORAL_TABLET | Freq: Every day | ORAL | Status: DC

## 2015-07-28 MED ORDER — ESCITALOPRAM OXALATE 20 MG PO TABS: ORAL_TABLET | ORAL | Status: DC

## 2015-07-28 MED ORDER — DILTIAZEM HCL ER COATED BEADS 180 MG PO CP24: ORAL_CAPSULE | ORAL | Status: DC

## 2015-07-28 MED ORDER — VALSARTAN 320 MG PO TABS: ORAL_TABLET | ORAL | Status: DC

## 2015-07-28 NOTE — Telephone Encounter (Signed)
Notified patient that med was sent to pharmacy.

## 2015-07-28 NOTE — Telephone Encounter (Signed)
escitalopram (LEXAPRO) 20 MG tablet  Pt states she never got this med when she got the rest of hers sent to [facility] our system shows they got it but she denies this   Can we resend it for her?    [place] [facility]

## 2015-07-28 NOTE — Telephone Encounter (Signed)
Refills complete 

## 2015-07-28 NOTE — Telephone Encounter (Signed)
Pt has switched pharmacies to [name] she needs her Cardizem, Lipitor and Diovan sent there, she has been out of them for the past 5 days

## 2015-08-01 ENCOUNTER — Telehealth: Payer: Self-pay | Admitting: Family Medicine

## 2015-08-01 ENCOUNTER — Telehealth (INDEPENDENT_AMBULATORY_CARE_PROVIDER_SITE_OTHER): Payer: Self-pay | Admitting: Internal Medicine

## 2015-08-01 DIAGNOSIS — K589 Irritable bowel syndrome without diarrhea: Secondary | ICD-10-CM

## 2015-08-01 NOTE — Telephone Encounter (Signed)
I spoke with patient. She needs to call Dr Wolfgang Phoenix

## 2015-08-01 NOTE — Telephone Encounter (Signed)
[name] called saying the Rx she has for Lomotil is for two pills per day. That didn't work for her so she's been taking three pills instead. She wants to make sure this is ok with Terri. Since she's been taking three per day, she's run out of medication and wants a refill sent to her pharmacy but wants it changed to taking three per day. She'd like a phone call regarding this.  Pt's ph# [phone] Thank you.

## 2015-08-01 NOTE — Telephone Encounter (Signed)
Pt called stating that the dose of the diphenoxylate-atropine (LOMOTIL) 2.5-0.025 MG tablet that she is on is no longer working and wants to know if she can go up to three a day.       [facility]

## 2015-08-02 NOTE — Telephone Encounter (Signed)
Pt calling to check on this.

## 2015-08-04 ENCOUNTER — Encounter: Payer: Self-pay | Admitting: Family Medicine

## 2015-08-04 DIAGNOSIS — I480 Paroxysmal atrial fibrillation: Secondary | ICD-10-CM | POA: Diagnosis not present

## 2015-08-04 DIAGNOSIS — E7800 Pure hypercholesterolemia, unspecified: Secondary | ICD-10-CM | POA: Diagnosis not present

## 2015-08-04 DIAGNOSIS — K219 Gastro-esophageal reflux disease without esophagitis: Secondary | ICD-10-CM | POA: Diagnosis not present

## 2015-08-04 DIAGNOSIS — R69 Illness, unspecified: Secondary | ICD-10-CM | POA: Diagnosis not present

## 2015-08-04 DIAGNOSIS — I10 Essential (primary) hypertension: Secondary | ICD-10-CM | POA: Diagnosis not present

## 2015-08-04 DIAGNOSIS — J45909 Unspecified asthma, uncomplicated: Secondary | ICD-10-CM | POA: Diagnosis not present

## 2015-08-04 DIAGNOSIS — E039 Hypothyroidism, unspecified: Secondary | ICD-10-CM | POA: Diagnosis not present

## 2015-08-04 MED ORDER — DIPHENOXYLATE-ATROPINE 2.5-0.025 MG PO TABS: 1.0000 | ORAL_TABLET | Freq: Two times a day (BID) | ORAL | Status: DC | PRN

## 2015-08-04 NOTE — Telephone Encounter (Signed)
No, two per d is max, can set her up again with gi if she desires

## 2015-08-04 NOTE — Telephone Encounter (Signed)
Please go ahead and give two refill. Also make sure referral system.

## 2015-08-04 NOTE — Telephone Encounter (Signed)
Spoke with patient and informed her per Dr.Scott Luking- We are sending in 2 refills on the lomotil in to pharmacy. Patient verbalized understanding.

## 2015-08-04 NOTE — Telephone Encounter (Signed)
Spoke with patient and informed her per Dr.Steve Luking- No, two per day of lomotil is the max, we can set patient up again with GI. Patient verbalized understanding and stated that she does need a refill on Lomotil because she is completely.

## 2015-08-13 DIAGNOSIS — J45998 Other asthma: Secondary | ICD-10-CM | POA: Diagnosis not present

## 2015-08-17 ENCOUNTER — Emergency Department (HOSPITAL_COMMUNITY): Payer: Medicare HMO

## 2015-08-17 ENCOUNTER — Emergency Department (HOSPITAL_COMMUNITY)
Admission: EM | Admit: 2015-08-17 | Discharge: 2015-08-17 | Disposition: A | Discharge status: Home / Self Care | Payer: Medicare HMO | Attending: Emergency Medicine | Admitting: Emergency Medicine

## 2015-08-17 ENCOUNTER — Ambulatory Visit (INDEPENDENT_AMBULATORY_CARE_PROVIDER_SITE_OTHER): Payer: Medicare Other | Admitting: Internal Medicine

## 2015-08-17 ENCOUNTER — Ambulatory Visit: Payer: Medicare Other | Admitting: Family Medicine

## 2015-08-17 ENCOUNTER — Encounter (HOSPITAL_COMMUNITY): Payer: Self-pay | Admitting: Emergency Medicine

## 2015-08-17 DIAGNOSIS — Z86010 Personal history of colonic polyps: Secondary | ICD-10-CM | POA: Insufficient documentation

## 2015-08-17 DIAGNOSIS — I10 Essential (primary) hypertension: Secondary | ICD-10-CM | POA: Insufficient documentation

## 2015-08-17 DIAGNOSIS — R404 Transient alteration of awareness: Secondary | ICD-10-CM | POA: Diagnosis not present

## 2015-08-17 DIAGNOSIS — Z8719 Personal history of other diseases of the digestive system: Secondary | ICD-10-CM | POA: Insufficient documentation

## 2015-08-17 DIAGNOSIS — I480 Paroxysmal atrial fibrillation: Secondary | ICD-10-CM | POA: Insufficient documentation

## 2015-08-17 DIAGNOSIS — J45998 Other asthma: Secondary | ICD-10-CM | POA: Diagnosis not present

## 2015-08-17 DIAGNOSIS — Z79899 Other long term (current) drug therapy: Secondary | ICD-10-CM | POA: Diagnosis not present

## 2015-08-17 DIAGNOSIS — E785 Hyperlipidemia, unspecified: Secondary | ICD-10-CM | POA: Diagnosis not present

## 2015-08-17 DIAGNOSIS — E039 Hypothyroidism, unspecified: Secondary | ICD-10-CM | POA: Diagnosis not present

## 2015-08-17 DIAGNOSIS — R059 Cough, unspecified: Secondary | ICD-10-CM

## 2015-08-17 DIAGNOSIS — R05 Cough: Secondary | ICD-10-CM | POA: Diagnosis not present

## 2015-08-17 DIAGNOSIS — Z90710 Acquired absence of both cervix and uterus: Secondary | ICD-10-CM | POA: Insufficient documentation

## 2015-08-17 DIAGNOSIS — E119 Type 2 diabetes mellitus without complications: Secondary | ICD-10-CM | POA: Insufficient documentation

## 2015-08-17 DIAGNOSIS — F17210 Nicotine dependence, cigarettes, uncomplicated: Secondary | ICD-10-CM | POA: Diagnosis not present

## 2015-08-17 DIAGNOSIS — R8271 Bacteriuria: Secondary | ICD-10-CM

## 2015-08-17 DIAGNOSIS — I2510 Atherosclerotic heart disease of native coronary artery without angina pectoris: Secondary | ICD-10-CM | POA: Insufficient documentation

## 2015-08-17 DIAGNOSIS — R112 Nausea with vomiting, unspecified: Secondary | ICD-10-CM | POA: Diagnosis not present

## 2015-08-17 DIAGNOSIS — Z9049 Acquired absence of other specified parts of digestive tract: Secondary | ICD-10-CM | POA: Insufficient documentation

## 2015-08-17 DIAGNOSIS — R531 Weakness: Secondary | ICD-10-CM | POA: Diagnosis not present

## 2015-08-17 DIAGNOSIS — R69 Illness, unspecified: Secondary | ICD-10-CM | POA: Diagnosis not present

## 2015-08-17 DIAGNOSIS — Z8679 Personal history of other diseases of the circulatory system: Secondary | ICD-10-CM | POA: Diagnosis not present

## 2015-08-17 DIAGNOSIS — R339 Retention of urine, unspecified: Secondary | ICD-10-CM | POA: Diagnosis not present

## 2015-08-17 LAB — CBC WITH DIFFERENTIAL/PLATELET
Basophils Absolute: 0 10*3/uL (ref 0.0–0.1)
Basophils Relative: 0 %
EOS ABS: 0.1 10*3/uL (ref 0.0–0.7)
EOS PCT: 1 %
HCT: 37.7 % (ref 36.0–46.0)
HEMOGLOBIN: 12.1 g/dL (ref 12.0–15.0)
LYMPHS ABS: 1 10*3/uL (ref 0.7–4.0)
Lymphocytes Relative: 9 %
MCH: 27.7 pg (ref 26.0–34.0)
MCHC: 32.1 g/dL (ref 30.0–36.0)
MCV: 86.3 fL (ref 78.0–100.0)
MONO ABS: 0.8 10*3/uL (ref 0.1–1.0)
MONOS PCT: 7 %
Neutro Abs: 10 10*3/uL — ABNORMAL HIGH (ref 1.7–7.7)
Neutrophils Relative %: 83 %
Platelets: 269 10*3/uL (ref 150–400)
RBC: 4.37 MIL/uL (ref 3.87–5.11)
RDW: 15.1 % (ref 11.5–15.5)
WBC: 12 10*3/uL — ABNORMAL HIGH (ref 4.0–10.5)

## 2015-08-17 LAB — URINALYSIS, ROUTINE W REFLEX MICROSCOPIC
Bilirubin Urine: NEGATIVE
Glucose, UA: NEGATIVE mg/dL
Ketones, ur: NEGATIVE mg/dL
LEUKOCYTES UA: NEGATIVE
Nitrite: NEGATIVE
Protein, ur: NEGATIVE mg/dL
pH: 5.5 (ref 5.0–8.0)

## 2015-08-17 LAB — BASIC METABOLIC PANEL
ANION GAP: 8 (ref 5–15)
BUN: 13 mg/dL (ref 6–20)
CHLORIDE: 109 mmol/L (ref 101–111)
CO2: 27 mmol/L (ref 22–32)
Calcium: 8.9 mg/dL (ref 8.9–10.3)
Creatinine, Ser: 0.84 mg/dL (ref 0.44–1.00)
GFR calc Af Amer: >60 mL/min (ref >60)
GFR calc non Af Amer: >60 mL/min (ref >60)
Glucose, Bld: 128 mg/dL — ABNORMAL HIGH (ref 65–99)
POTASSIUM: 3.2 mmol/L — AB (ref 3.5–5.1)
SODIUM: 144 mmol/L (ref 135–145)

## 2015-08-17 LAB — URINE MICROSCOPIC-ADD ON

## 2015-08-17 MED ORDER — PREDNISONE 20 MG PO TABS: 40.0000 mg | ORAL_TABLET | Freq: Every day | ORAL | Status: DC

## 2015-08-17 MED ORDER — SULFAMETHOXAZOLE-TRIMETHOPRIM 800-160 MG PO TABS: 1.0000 | ORAL_TABLET | Freq: Two times a day (BID) | ORAL | Status: AC

## 2015-08-17 MED ORDER — PREDNISONE 20 MG PO TABS
40.0000 mg | ORAL_TABLET | Freq: Once | ORAL | Status: AC
  Administered 2015-08-17: 40 mg via ORAL
  Filled 2015-08-17: qty 2

## 2015-08-17 MED ORDER — ONDANSETRON 4 MG PO TBDP: 4.0000 mg | ORAL_TABLET | Freq: Three times a day (TID) | ORAL | Status: DC | PRN

## 2015-08-17 MED ORDER — ONDANSETRON HCL 4 MG/2ML IJ SOLN
4.0000 mg | Freq: Once | INTRAMUSCULAR | Status: AC
  Administered 2015-08-17: 4 mg via INTRAVENOUS
  Filled 2015-08-17: qty 2

## 2015-08-17 NOTE — ED Notes (Signed)
Pt from home has not been urinating normally for 2 weeks, has n/v since last night, 4 zofran given en route.  Pt has productive cough green sputum for 2 days. Denies cp/sob.  cbg 157, 191/79

## 2015-08-17 NOTE — ED Provider Notes (Signed)
CSN: [id number]     Arrival date & time 08/17/15  0750 History   First MD Initiated Contact with Patient 08/17/15 [phone]     Chief Complaint  Patient presents with  . Nausea     (Consider location/radiation/quality/duration/timing/severity/associated sxs/prior Treatment) HPI Comments: The patient is a 75 year old female with a history of asthma, has been having 3 days of productive cough, green phlegm, she has also had 3 weeks of urinary urgency, denies fevers chills or diarrhea but does have some subjective fevers which she states got as high as 99.7. She has had a couple episodes of vomiting overnight. Otherwise the patient is not having abdominal pain back pain or chest pain.  The history is provided by the patient.    Past Medical History  Diagnosis Date  . ASCVD (arteriosclerotic cardiovascular disease)     stent to mid and proximal left anterior descending in 06/2002;drug eluting stent placed in the second diagnol in 08/2003 after  A  non-st elevation myocardial infarction   . Tobacco user     stopped   . Hyperlipidemia     pulmonary embolism 2000 and 09/2008  . Hypertension   . Pulmonary embolism ([place])     2000/09/2008  . Thyroid disease     hypothyroidism  . Allergy history unknown   . GERD (gastroesophageal reflux disease)   . Diabetes mellitus     no insulin  . Asthma   . FH: colonic polyps     adenomataous  . Anticoagulated   . Diarrhea     acute  . Rectal bleeding   . Pedal edema   . Irritable bowel syndrome   . Coronary artery disease   . Paroxysmal atrial fibrillation ([facility])     normal LV function; episodes occurred in 205 and 09/2007  . Hypothyroidism   . Peripheral edema   . Allergy   . Atrial fibrillation ([place])   . Chronic anticoagulation   . Venous stasis   . Insomnia   . Chronic pain of left knee   . Diarrhea    Past Surgical History  Procedure Laterality Date  . Cleft palate repair      4 surgeries  . Abdominal hysterectomy    . Appendectomy     . Cholecystectomy    . Heel spur surgery    . Knee surgery    . Breast reduction surgery    . Colonoscopy  2011    negative  . Oophorectomy    . Breast surgery    . Esophagogastroduodenoscopy N/A 11/11/2013    Procedure: ESOPHAGOGASTRODUODENOSCOPY (EGD);  Surgeon: Rogene Houston, MD;  [facility];  Service: Endoscopy;  Laterality: N/A;  200-moved to 100 Ann notified pt  . Balloon dilation N/A 11/11/2013    Procedure: BALLOON DILATION;  Surgeon: Rogene Houston, MD;  Location: [facility];  Service: Endoscopy;  Laterality: N/A;  Venia Minks dilation N/A 11/11/2013    Procedure: Venia Minks DILATION;  Surgeon: Rogene Houston, MD;  [facility];  Service: Endoscopy;  Laterality: N/A;  . Savory dilation N/A 11/11/2013    Procedure: SAVORY DILATION;  Surgeon: Rogene Houston, MD;  [facility];  Service: Endoscopy;  Laterality: N/A;  . Esophagogastroduodenoscopy N/A 05/09/2014    Procedure: ESOPHAGOGASTRODUODENOSCOPY (EGD);  Surgeon: Rogene Houston, MD;  [facility];  Service: Endoscopy;  Laterality: N/A;  . Colonoscopy N/A 05/10/2014    Procedure: COLONOSCOPY;  Surgeon: Rogene Houston, MD;  [facility];  Service: Endoscopy;  Laterality: N/A;   Family History  Problem Relation Age of Onset  . Heart attack Mother   . Diabetes Mother   . Hyperlipidemia Mother   . Heart attack Father   . Lung cancer Sister   . Lymphoma Brother   . Colon cancer Neg Hx    Social History  Substance Use Topics  . Smoking status: Former Smoker -- 2.00 packs/day for 30 years    Types: Cigarettes    Start date: 05/05/1987  . Smokeless tobacco: Never Used  . Alcohol Use: No   OB History    No data available     Review of Systems  All other systems reviewed and are negative.     Allergies  Demerol  Home Medications   Prior to Admission medications   Medication Sig Start Date End Date Taking? Authorizing Provider  acetaminophen (TYLENOL)  500 MG tablet Take 500 mg by mouth daily as needed for headache.   Yes Historical Provider, MD  albuterol (PROAIR HFA) 108 (90 Base) MCG/ACT inhaler INHALE 2 PUFFS UP TO FOUR TIMES DAILY AS NEEDED FOR WHEEZING. 07/14/15  Yes Kathyrn Drown, MD  albuterol (PROVENTIL) (2.5 MG/3ML) 0.083% nebulizer solution Take 3 mLs (2.5 mg total) by nebulization every 6 (six) hours as needed for wheezing or shortness of breath. 05/24/15  Yes Kathyrn Drown, MD  atorvastatin (LIPITOR) 40 MG tablet Take 1 tablet (40 mg total) by mouth daily. 07/28/15  Yes Herminio Commons, MD  budesonide-formoterol (SYMBICORT) 160-4.5 MCG/ACT inhaler Inhale 2 puffs into the lungs 2 (two) times daily. 07/14/15  Yes Kathyrn Drown, MD  diltiazem (CARDIZEM CD) 180 MG 24 hr capsule TAKE 1 CAPSULE BY MOUTH ONCE A DAY. 07/28/15  Yes Herminio Commons, MD  diphenoxylate-atropine (LOMOTIL) 2.5-0.025 MG tablet Take 1 tablet by mouth 2 (two) times daily as needed. 08/04/15  Yes Kathyrn Drown, MD  escitalopram (LEXAPRO) 20 MG tablet TAKE (1) TABLET BY MOUTH ONCE DAILY. 07/28/15  Yes Mikey Kirschner, MD  levothyroxine (SYNTHROID, LEVOTHROID) 50 MCG tablet Take 1 tablet (50 mcg total) by mouth daily. 07/14/15  Yes Kathyrn Drown, MD  ondansetron (ZOFRAN-ODT) 4 MG disintegrating tablet Take 4 mg by mouth every 8 (eight) hours as needed for nausea or vomiting. Reported on 06/13/2015   Yes Historical Provider, MD  Oxycodone HCl 10 MG TABS Take one tablet up to qid prn pain 07/19/15  Yes Kathyrn Drown, MD  pantoprazole (PROTONIX) 40 MG tablet Take 1 tablet (40 mg total) by mouth 2 (two) times daily before a meal. Patient taking differently: Take 40 mg by mouth 2 (two) times daily.  11/11/13  Yes Rogene Houston, MD  promethazine (PHENERGAN) 25 MG suppository Place 1 suppository (25 mg total) rectally every 6 (six) hours as needed for nausea or vomiting. [id number]  Yes Delora Fuel, MD  tiZANidine (ZANAFLEX) 4 MG tablet Take 1 tablet (4 mg total) by mouth 2  (two) times daily as needed for muscle spasms. 02/16/15  Yes Mikey Kirschner, MD  valsartan (DIOVAN) 320 MG tablet TAKE (1) TABLET BY MOUTH ONCE DAILY. 07/28/15  Yes Herminio Commons, MD  warfarin (COUMADIN) 5 MG tablet TAKE 1 TABLET DAILY, TAKE 1/2 TABLET ON TUESDAY & FRIDAY. 07/14/15  Yes Kathyrn Drown, MD  amoxicillin (AMOXIL) 500 MG tablet Take 1 tablet (500 mg total) by mouth 3 (three) times daily. Patient not taking: Reported on 08/17/2015 06/13/15   Nicki Reaper A  Wolfgang Phoenix, MD  cefPROZIL (CEFZIL) 250 MG tablet Take 1 tablet (250 mg total) by mouth 2 (two) times daily. Patient not taking: Reported on 06/13/2015 05/24/15   Kathyrn Drown, MD  predniSONE (DELTASONE) 20 MG tablet Take 2 tablets (40 mg total) by mouth daily. 08/17/15   Noemi Chapel, MD  rifaximin (XIFAXAN) 550 MG TABS tablet Take 1 tablet (550 mg total) by mouth 3 (three) times daily. Patient not taking: Reported on 08/17/2015 08/23/14   Rogene Houston, MD  sulfamethoxazole-trimethoprim (BACTRIM DS,SEPTRA DS) 800-160 MG tablet Take 1 tablet by mouth 2 (two) times daily. 08/17/15 08/24/15  Noemi Chapel, MD   BP 146/63 mmHg  Pulse 54  Temp(Src) 97.9 F (36.6 C) (Oral)  Resp 16  Ht 5\' 1"  (1.549 m)  Wt 209 lb (94.802 kg)  BMI 39.51 kg/m2  SpO2 93% Physical Exam  Constitutional: She appears well-developed and well-nourished. No distress.  HENT:  Head: Normocephalic and atraumatic.  Mouth/Throat: Oropharynx is clear and moist. No oropharyngeal exudate.  Eyes: Conjunctivae and EOM are normal. Pupils are equal, round, and reactive to light. Right eye exhibits no discharge. Left eye exhibits no discharge. No scleral icterus.  Neck: Normal range of motion. Neck supple. No JVD present. No thyromegaly present.  Cardiovascular: Normal rate, regular rhythm, normal heart sounds and intact distal pulses.  Exam reveals no gallop and no friction rub.   No murmur heard. Pulmonary/Chest: Effort normal. No respiratory distress. She has wheezes ( No  rales, no increased work of breathing, no accessory muscle use, speaks in full sentences, mild diffuse expiratory wheezing with prolonged expiratory phase). She has no rales.  Abdominal: Soft. Bowel sounds are normal. She exhibits no distension and no mass. There is no tenderness.  Musculoskeletal: Normal range of motion. She exhibits no edema or tenderness.  Lymphadenopathy:    She has no cervical adenopathy.  Neurological: She is alert. Coordination normal.  Skin: Skin is warm and dry. No rash noted. No erythema.  Psychiatric: She has a normal mood and affect. Her behavior is normal.  Nursing note and vitals reviewed.   ED Course  Procedures (including critical care time) Labs Review Labs Reviewed  CBC WITH DIFFERENTIAL/PLATELET - Abnormal; Notable for the following:    WBC 12.0 (*)    Neutro Abs 10.0 (*)    All other components within normal limits  BASIC METABOLIC PANEL - Abnormal; Notable for the following:    Potassium 3.2 (*)    Glucose, Bld 128 (*)    All other components within normal limits  URINALYSIS, ROUTINE W REFLEX MICROSCOPIC (NOT AT [facility]) - Abnormal; Notable for the following:    Specific Gravity, Urine >1.030 (*)    Hgb urine dipstick MODERATE (*)    All other components within normal limits  URINE MICROSCOPIC-ADD ON - Abnormal; Notable for the following:    Squamous Epithelial / LPF 6-30 (*)    Bacteria, UA MANY (*)    All other components within normal limits  URINE CULTURE    Imaging Review Dg Chest 2 View  08/17/2015  CLINICAL DATA:  Cough EXAM: CHEST  2 VIEW COMPARISON:  01/11/2015 FINDINGS: Chronic borderline cardiomegaly. Negative aortic and hilar contours. Chronic interstitial coarsening with flat diaphragm, likely related to smoking history. There is no edema, consolidation, effusion, or pneumothorax. T3 through T6 and T12 compression fractures, chronic based on 2016 CT. There is exaggerated thoracic kyphosis. IMPRESSION: No acute finding. Electronically  Signed   By: Neva Seat.D.  On: 08/17/2015 08:33   I have personally reviewed and evaluated these images and lab results as part of my medical decision-making.    MDM   Final diagnoses:  Cough  Bacteriuria    Vital signs are unremarkable except for a 93% oxygenation, she is 95% on my exam, we'll obtain a chest x-ray to rule out pneumonia, urinalysis to evaluate for potential urinary tract infection, she has no abdominal tenderness, no back symptoms, no chest pain to suggest another etiology. The patient is in agreement with the initial plan.  Cxr, labs reivewe Pt improved with meds Declines cough meds u cultures Stable for d/c.  ,I have personally viewed and interpreted the imaging and agree with radiologist interpretation.   Expressed verbal understanding to the verbal instructions, written instructions and for prescription is given, patient stable for discharge.  Meds given in ED:  Medications  ondansetron (ZOFRAN) injection 4 mg (4 mg Intravenous Given 08/17/15 0948)  predniSONE (DELTASONE) tablet 40 mg (40 mg Oral Given 08/17/15 1052)    New Prescriptions   PREDNISONE (DELTASONE) 20 MG TABLET    Take 2 tablets (40 mg total) by mouth daily.   SULFAMETHOXAZOLE-TRIMETHOPRIM (BACTRIM DS,SEPTRA DS) 800-160 MG TABLET    Take 1 tablet by mouth 2 (two) times daily.      Noemi Chapel, MD 08/17/15 [phone]

## 2015-08-17 NOTE — Discharge Instructions (Signed)
Bactrim twice a day for the next 7 days Over-the-counter cough medication as needed Prednisone daily for 5 days Albuterol 2 puffs every 4 hours as needed for shortness of breath or coughing or wheezing.  Seek medical attention for severe or worsening symptoms

## 2015-08-18 LAB — URINE CULTURE: Culture: 1000

## 2015-08-21 ENCOUNTER — Ambulatory Visit (INDEPENDENT_AMBULATORY_CARE_PROVIDER_SITE_OTHER): Payer: Medicare HMO | Admitting: Family Medicine

## 2015-08-21 ENCOUNTER — Encounter: Payer: Self-pay | Admitting: Family Medicine

## 2015-08-21 VITALS — BP 110/74 | Ht 61.0 in | Wt 207.5 lb

## 2015-08-21 DIAGNOSIS — Z79899 Other long term (current) drug therapy: Secondary | ICD-10-CM | POA: Diagnosis not present

## 2015-08-21 DIAGNOSIS — E039 Hypothyroidism, unspecified: Secondary | ICD-10-CM | POA: Diagnosis not present

## 2015-08-21 DIAGNOSIS — I10 Essential (primary) hypertension: Secondary | ICD-10-CM | POA: Diagnosis not present

## 2015-08-21 DIAGNOSIS — E119 Type 2 diabetes mellitus without complications: Secondary | ICD-10-CM | POA: Diagnosis not present

## 2015-08-21 DIAGNOSIS — Z7901 Long term (current) use of anticoagulants: Secondary | ICD-10-CM | POA: Diagnosis not present

## 2015-08-21 DIAGNOSIS — Z13220 Encounter for screening for lipoid disorders: Secondary | ICD-10-CM | POA: Diagnosis not present

## 2015-08-21 LAB — POCT INR: INR: 3.8

## 2015-08-21 MED ORDER — OXYCODONE HCL 10 MG PO TABS: ORAL_TABLET | ORAL | Status: DC

## 2015-08-21 NOTE — Patient Instructions (Signed)
Take 1/2 tablet on Mondays, Wednesdays, and Fridays. All other days take 1 whole tablet.

## 2015-08-21 NOTE — Progress Notes (Signed)
   Subjective:    Patient ID: [name], female    DOB: 1940-10-06, 75 y.o.   MRN: [id number]  Hypertension This is a chronic problem. The current episode started more than 1 year ago. There are no compliance problems.    patient claims compliance with blood pressure medicine. No obvious side effects watching salt intake. Next  Needs ongoing pain medicine. Time for refill. Takes faithfully. No obvious side effects. States definitely needs.  States sugars generally running good. Mostly around 100 110. 200 best watch sugars in the diet.  Recently had antibiotics for UTI/bronchitis. Compliant with her Coumadin.  On medicine for thyroid. No symptoms of high or low thyroid other than occasional fatigue. No blood work recently    Review of Systems No headache no chest pain no abdominal pain some back pain no change in bowel habits ROS otherwise negative    Objective:   Physical Exam Alert vital stable blood pressure good on repeat H&T slight nasal congestion frontal normal lungs clear heart regular rhythm ankle no significant edema feet sensation intact pulses good       Assessment & Plan:  Impression 1 type 2 diabetes A1c pending #2 hypertension good control measure. Maintain same #3 chronic anticoagulation INR a bit high will adjust Coumadin downward rationale discussed likely due to concurrent antibiotics No. 4 bronchitis UTI improved #5 chronic pain in need to meds plan as noted above meds prescribed appropriate blood work further recommendations based results recheck in several months [facility]

## 2015-08-22 ENCOUNTER — Telehealth (INDEPENDENT_AMBULATORY_CARE_PROVIDER_SITE_OTHER): Payer: Self-pay | Admitting: Internal Medicine

## 2015-08-22 NOTE — Telephone Encounter (Signed)
Stated she was coughing up blood tinged sputum. I advised her to call her PCP

## 2015-08-22 NOTE — Telephone Encounter (Signed)
[name] left a message saying she's having problems with diarrhea and she wants to discuss the medication she's taking. She'd like a phone call regarding this.  Pt's ph# [phone]  Thank you.

## 2015-08-28 ENCOUNTER — Telehealth: Payer: Self-pay | Admitting: *Deleted

## 2015-08-28 NOTE — Telephone Encounter (Signed)
advair 230/21 two puffs bid ref prn

## 2015-08-28 NOTE — Telephone Encounter (Signed)
LMRC

## 2015-08-28 NOTE — Telephone Encounter (Signed)
symbicort is not covered by pt's inusurance. Pt must first try advair and breo first. i do not see where pt has tried either of these drugs on her med history. Please advise.

## 2015-08-29 ENCOUNTER — Telehealth: Payer: Self-pay | Admitting: Family Medicine

## 2015-08-29 NOTE — Telephone Encounter (Signed)
Ok six ref 

## 2015-08-29 NOTE — Telephone Encounter (Signed)
Received a Rx request via fax from [facility] for Flovent HFA 110 MCG inhaler with script for 2 puffs twice daily. Did not see medication on patient med list. May we send in medication?

## 2015-08-30 MED ORDER — FLUTICASONE PROPIONATE HFA 110 MCG/ACT IN AERO: INHALATION_SPRAY | RESPIRATORY_TRACT | Status: DC

## 2015-08-30 MED ORDER — FLUTICASONE-SALMETEROL 230-21 MCG/ACT IN AERO: 2.0000 | INHALATION_SPRAY | Freq: Two times a day (BID) | RESPIRATORY_TRACT | Status: DC

## 2015-08-30 NOTE — Telephone Encounter (Signed)
Discussed with patient. Rx for advair sent electronically to pharmacy. Patient verbalized understanding.

## 2015-09-10 DIAGNOSIS — J45998 Other asthma: Secondary | ICD-10-CM | POA: Diagnosis not present

## 2015-09-19 ENCOUNTER — Ambulatory Visit: Payer: Medicare HMO

## 2015-09-20 ENCOUNTER — Ambulatory Visit (INDEPENDENT_AMBULATORY_CARE_PROVIDER_SITE_OTHER): Payer: Medicare HMO

## 2015-09-20 DIAGNOSIS — I10 Essential (primary) hypertension: Secondary | ICD-10-CM | POA: Diagnosis not present

## 2015-09-20 DIAGNOSIS — E119 Type 2 diabetes mellitus without complications: Secondary | ICD-10-CM | POA: Diagnosis not present

## 2015-09-20 DIAGNOSIS — Z7901 Long term (current) use of anticoagulants: Secondary | ICD-10-CM | POA: Diagnosis not present

## 2015-09-20 DIAGNOSIS — Z13220 Encounter for screening for lipoid disorders: Secondary | ICD-10-CM | POA: Diagnosis not present

## 2015-09-20 DIAGNOSIS — E039 Hypothyroidism, unspecified: Secondary | ICD-10-CM | POA: Diagnosis not present

## 2015-09-20 DIAGNOSIS — Z79899 Other long term (current) drug therapy: Secondary | ICD-10-CM | POA: Diagnosis not present

## 2015-09-20 LAB — POCT INR: INR: 2.1

## 2015-09-21 LAB — LIPID PANEL
CHOL/HDL RATIO: 2.7 ratio (ref 0.0–4.4)
Cholesterol, Total: 153 mg/dL (ref 100–199)
HDL: 56 mg/dL (ref >39)
LDL CALC: 81 mg/dL (ref 0–99)
Triglycerides: 79 mg/dL (ref 0–149)
VLDL Cholesterol Cal: 16 mg/dL (ref 5–40)

## 2015-09-21 LAB — BASIC METABOLIC PANEL
BUN / CREAT RATIO: 14 (ref 11–26)
BUN: 11 mg/dL (ref 8–27)
CALCIUM: 9.4 mg/dL (ref 8.7–10.3)
CHLORIDE: 101 mmol/L (ref 96–106)
CO2: 27 mmol/L (ref 18–29)
CREATININE: 0.79 mg/dL (ref 0.57–1.00)
GFR calc non Af Amer: 73 mL/min/{1.73_m2} (ref >59)
GFR, EST AFRICAN AMERICAN: 84 mL/min/{1.73_m2} (ref >59)
GLUCOSE: 101 mg/dL — AB (ref 65–99)
Potassium: 4.2 mmol/L (ref 3.5–5.2)
Sodium: 144 mmol/L (ref 134–144)

## 2015-09-21 LAB — HEPATIC FUNCTION PANEL
ALT: 7 IU/L (ref 0–32)
AST: 18 IU/L (ref 0–40)
Albumin: 3.8 g/dL (ref 3.5–4.8)
Alkaline Phosphatase: 81 IU/L (ref 39–117)
BILIRUBIN TOTAL: 0.3 mg/dL (ref 0.0–1.2)
BILIRUBIN, DIRECT: 0.09 mg/dL (ref 0.00–0.40)
TOTAL PROTEIN: 6.2 g/dL (ref 6.0–8.5)

## 2015-09-21 LAB — HEMOGLOBIN A1C
ESTIMATED AVERAGE GLUCOSE: 123 mg/dL
Hgb A1c MFr Bld: 5.9 % — ABNORMAL HIGH (ref 4.8–5.6)

## 2015-09-21 LAB — TSH: TSH: 0.58 u[IU]/mL (ref 0.450–4.500)

## 2015-09-24 ENCOUNTER — Encounter: Payer: Self-pay | Admitting: Family Medicine

## 2015-09-25 ENCOUNTER — Telehealth: Payer: Self-pay | Admitting: Family Medicine

## 2015-09-25 DIAGNOSIS — T148 Other injury of unspecified body region: Secondary | ICD-10-CM | POA: Diagnosis not present

## 2015-09-25 DIAGNOSIS — M25561 Pain in right knee: Secondary | ICD-10-CM | POA: Diagnosis not present

## 2015-09-25 MED ORDER — OXYCODONE HCL 10 MG PO TABS: ORAL_TABLET | ORAL | Status: DC

## 2015-09-25 NOTE — Telephone Encounter (Signed)
Ok times one 

## 2015-09-25 NOTE — Telephone Encounter (Signed)
Pt is requesting a refill on her oxycodone.

## 2015-09-25 NOTE — Telephone Encounter (Signed)
Left message informing patient that prescription will be ready for pick up tomorrow.

## 2015-09-27 ENCOUNTER — Other Ambulatory Visit: Payer: Self-pay | Admitting: Family Medicine

## 2015-09-28 ENCOUNTER — Encounter: Payer: Self-pay | Admitting: Family Medicine

## 2015-09-28 ENCOUNTER — Ambulatory Visit (INDEPENDENT_AMBULATORY_CARE_PROVIDER_SITE_OTHER): Payer: Medicare HMO | Admitting: Family Medicine

## 2015-09-28 VITALS — Ht 61.0 in | Wt 207.0 lb

## 2015-09-28 DIAGNOSIS — M1711 Unilateral primary osteoarthritis, right knee: Secondary | ICD-10-CM | POA: Diagnosis not present

## 2015-09-28 DIAGNOSIS — R5383 Other fatigue: Secondary | ICD-10-CM | POA: Diagnosis not present

## 2015-09-28 DIAGNOSIS — I10 Essential (primary) hypertension: Secondary | ICD-10-CM

## 2015-09-28 DIAGNOSIS — Z7901 Long term (current) use of anticoagulants: Secondary | ICD-10-CM

## 2015-09-28 DIAGNOSIS — J4541 Moderate persistent asthma with (acute) exacerbation: Secondary | ICD-10-CM | POA: Diagnosis not present

## 2015-09-28 MED ORDER — PREDNISONE 5 MG PO TABS: ORAL_TABLET | ORAL | Status: DC

## 2015-09-28 MED ORDER — OXYCODONE HCL 10 MG PO TABS: ORAL_TABLET | ORAL | Status: DC

## 2015-09-28 NOTE — Progress Notes (Signed)
   Subjective:    Patient ID: [name], female    DOB: [birth year], 75 y.o.   MRN: [id number]  HPI  This patient was seen today for chronic pain  The medication list was reviewed and updated.   -Compliance with medication: Patient on Oxycodone 10 mg -no helping at all with pain-patient states she has no quality of life  and heeds pain management and prednisone  - Number patient states they take daily: 4 pills a day  -when was the last dose patient took? today  The patient was advised the importance of maintaining medication and not using illegal substances with these.  Refills needed: yes  The patient was educated that we can provide 3 monthly scripts for their medication, it is their responsibility to follow the instructions.  Side effects or complications from medications: none  Patient is aware that pain medications are meant to minimize the severity of the pain to allow their pain levels to improve to allow for better function. They are aware of that pain medications cannot totally remove their pain.  Due for UDT ( at least once per year) :   Patient compliant blood pressure medicine. No obvious side effects. Watching salt intake. Does not miss a dose.  Compliant with all of her COPD/asthma medications. Does not miss dose generally helps. Although notes ongoing shortness of breath and wheezing. States her breathing used to be much better when she took low-dose prednisone.   History of polymyalgia rheumatica felt by the rheumatologist though not to be experiencing active this in the past couple years. Also felt to have ongoing pain from both osteoarthritis degenerative disc disease and fibromyalgia. Patient notes pain is severe and unrelenting and states was much better when she took prednisone       Review of Systems Positive shortness breath positive diffuse achiness joint and muscle pain diminished energy no headache no chest pain    Objective:   Physical  Exam  Alert obesity present vitals stable lungs diminished breath sounds no true wheezes currently heart regular in rhythm 70s good range of motion pain with extension of knees and rotation of shoulders diffuse tenderness shoulders legs etc.      Assessment & Plan:  Impression 1 fibromyalgia considerable with ongoing difficulties #2 chronic arthritis pain with element of polymyalgia rheumatica coming and going. Patient stating prednisone that she is using the past definitely helps with this #3 COPD with asthma ongoing and challenging patient strongly requesting prednisone realizing it may cause her trouble plan prednisone refilled after considerable discussion. Not an ideal rationale but patient adamant about getting back on it. Pain medication prescribed also follow-up in several months [facility]

## 2015-10-03 DIAGNOSIS — M170 Bilateral primary osteoarthritis of knee: Secondary | ICD-10-CM | POA: Diagnosis not present

## 2015-10-11 DIAGNOSIS — J45998 Other asthma: Secondary | ICD-10-CM | POA: Diagnosis not present

## 2015-10-18 ENCOUNTER — Ambulatory Visit: Payer: Medicare HMO

## 2015-10-19 ENCOUNTER — Ambulatory Visit (INDEPENDENT_AMBULATORY_CARE_PROVIDER_SITE_OTHER): Payer: Medicare HMO

## 2015-10-19 DIAGNOSIS — Z7901 Long term (current) use of anticoagulants: Secondary | ICD-10-CM

## 2015-10-19 LAB — POCT INR: INR: 1.9

## 2015-11-10 DIAGNOSIS — J45998 Other asthma: Secondary | ICD-10-CM | POA: Diagnosis not present

## 2015-11-13 ENCOUNTER — Other Ambulatory Visit: Payer: Self-pay | Admitting: Family Medicine

## 2015-11-13 NOTE — Telephone Encounter (Signed)
Ok plus five ref 

## 2015-11-16 ENCOUNTER — Ambulatory Visit (INDEPENDENT_AMBULATORY_CARE_PROVIDER_SITE_OTHER): Payer: Medicare HMO

## 2015-11-16 ENCOUNTER — Ambulatory Visit: Payer: Medicare HMO

## 2015-11-16 DIAGNOSIS — Z7901 Long term (current) use of anticoagulants: Secondary | ICD-10-CM

## 2015-11-16 LAB — POCT INR: INR: 1.8

## 2015-11-21 ENCOUNTER — Ambulatory Visit: Payer: Medicare HMO | Admitting: Family Medicine

## 2015-11-29 ENCOUNTER — Other Ambulatory Visit: Payer: Self-pay | Admitting: Family Medicine

## 2015-12-11 ENCOUNTER — Other Ambulatory Visit: Payer: Self-pay | Admitting: Family Medicine

## 2015-12-11 ENCOUNTER — Telehealth: Payer: Self-pay | Admitting: Family Medicine

## 2015-12-11 DIAGNOSIS — J45998 Other asthma: Secondary | ICD-10-CM | POA: Diagnosis not present

## 2015-12-11 MED ORDER — WARFARIN SODIUM 5 MG PO TABS: ORAL_TABLET | ORAL | Status: DC

## 2015-12-11 NOTE — Telephone Encounter (Signed)
warfarin (COUMADIN) 5 MG tablet  [place] [facility]   Please send this in as soon as possible she needs it today,  Has been out since yesterday

## 2015-12-11 NOTE — Telephone Encounter (Signed)
Notified patient med sent to the pharmacy.

## 2015-12-21 ENCOUNTER — Ambulatory Visit: Payer: Medicare HMO

## 2015-12-28 ENCOUNTER — Ambulatory Visit: Payer: Medicare HMO | Admitting: Family Medicine

## 2016-01-01 ENCOUNTER — Other Ambulatory Visit: Payer: Self-pay | Admitting: Family Medicine

## 2016-01-03 ENCOUNTER — Encounter: Payer: Self-pay | Admitting: Family Medicine

## 2016-01-03 ENCOUNTER — Ambulatory Visit (INDEPENDENT_AMBULATORY_CARE_PROVIDER_SITE_OTHER): Payer: Medicare HMO | Admitting: Family Medicine

## 2016-01-03 VITALS — BP 120/84 | Ht 61.0 in | Wt 216.0 lb

## 2016-01-03 DIAGNOSIS — I10 Essential (primary) hypertension: Secondary | ICD-10-CM | POA: Diagnosis not present

## 2016-01-03 DIAGNOSIS — E119 Type 2 diabetes mellitus without complications: Secondary | ICD-10-CM

## 2016-01-03 DIAGNOSIS — Z7901 Long term (current) use of anticoagulants: Secondary | ICD-10-CM | POA: Diagnosis not present

## 2016-01-03 DIAGNOSIS — J4541 Moderate persistent asthma with (acute) exacerbation: Secondary | ICD-10-CM | POA: Diagnosis not present

## 2016-01-03 LAB — POCT GLYCOSYLATED HEMOGLOBIN (HGB A1C): Hemoglobin A1C: 5.1

## 2016-01-03 LAB — POCT INR: INR: 1.8

## 2016-01-03 MED ORDER — OXYCODONE HCL 10 MG PO TABS: ORAL_TABLET | ORAL | Status: DC

## 2016-01-03 NOTE — Progress Notes (Signed)
   Subjective:    Patient ID: [name], female    DOB: 01/20/1941, 75 y.o.   MRN: [id number] Patient arrives office for numerous concerns Diabetes She presents for her follow-up diabetic visit. She has type 2 diabetes mellitus. There are no hypoglycemic associated symptoms. There are no diabetic associated symptoms. There are no hypoglycemic complications. There are no diabetic complications. There are no known risk factors for coronary artery disease. Current diabetic treatment includes diet. She is compliant with treatment all of the time.    Please see prior note. Steroids initiated for chronic unrelenting severe arthralgias/arthritis/polymyalgia rheumatica-like symptoms has left patient with a tremendous improvement.  Compliant with Coumadin.  Wheezing shortness breath overall stable on current medications. Please complete INR sheet in blue folder with current recommendations.   Patient states that she is having bilateral knee pain. Patient still currently taking prednisone.   Patient has not had a diabetic eye exam recently.   Results for orders placed or performed in visit on 01/03/16  POCT glycosylated hemoglobin (Hb A1C)  Result Value Ref Range   Hemoglobin A1C 5.1   POCT INR  Result Value Ref Range   INR 1.8    Patient has a mole on her left leg also. She would like that checked today.   Review of Systems No headache, no major weight loss or weight gain, no chest pain no back pain abdominal pain no change in bowel habits complete ROS otherwise negative     Objective:   Physical Exam Alert vital stable lungs diminished breath sounds diffusely heart rare rhythm knees bilateral crepitations positive low back pain to percussion ankles trace edema       Assessment & Plan:  Impression 1 type 2 diabetes good control discussed maintain same meds #2 chronic arthritis/polymyalgia rheumatica equivalent much improved on steroids see prior notes #3 chronic pain ongoing need  for meds 4 anticoagulation somewhat suboptimum have adjusted Coumadin

## 2016-01-10 DIAGNOSIS — J45998 Other asthma: Secondary | ICD-10-CM | POA: Diagnosis not present

## 2016-01-21 ENCOUNTER — Emergency Department (HOSPITAL_COMMUNITY)
Admission: EM | Admit: 2016-01-21 | Discharge: 2016-01-21 | Disposition: A | Discharge status: Home / Self Care | Payer: Medicare HMO | Attending: Emergency Medicine | Admitting: Emergency Medicine

## 2016-01-21 ENCOUNTER — Encounter (HOSPITAL_COMMUNITY): Payer: Self-pay

## 2016-01-21 DIAGNOSIS — E039 Hypothyroidism, unspecified: Secondary | ICD-10-CM | POA: Insufficient documentation

## 2016-01-21 DIAGNOSIS — I10 Essential (primary) hypertension: Secondary | ICD-10-CM | POA: Insufficient documentation

## 2016-01-21 DIAGNOSIS — Z79899 Other long term (current) drug therapy: Secondary | ICD-10-CM | POA: Insufficient documentation

## 2016-01-21 DIAGNOSIS — J45909 Unspecified asthma, uncomplicated: Secondary | ICD-10-CM | POA: Insufficient documentation

## 2016-01-21 DIAGNOSIS — I2510 Atherosclerotic heart disease of native coronary artery without angina pectoris: Secondary | ICD-10-CM | POA: Insufficient documentation

## 2016-01-21 DIAGNOSIS — R110 Nausea: Secondary | ICD-10-CM

## 2016-01-21 DIAGNOSIS — Z87891 Personal history of nicotine dependence: Secondary | ICD-10-CM | POA: Diagnosis not present

## 2016-01-21 DIAGNOSIS — R030 Elevated blood-pressure reading, without diagnosis of hypertension: Secondary | ICD-10-CM | POA: Diagnosis not present

## 2016-01-21 DIAGNOSIS — Z7901 Long term (current) use of anticoagulants: Secondary | ICD-10-CM | POA: Insufficient documentation

## 2016-01-21 DIAGNOSIS — R001 Bradycardia, unspecified: Secondary | ICD-10-CM | POA: Diagnosis not present

## 2016-01-21 DIAGNOSIS — R1110 Vomiting, unspecified: Secondary | ICD-10-CM | POA: Diagnosis present

## 2016-01-21 DIAGNOSIS — E119 Type 2 diabetes mellitus without complications: Secondary | ICD-10-CM | POA: Diagnosis not present

## 2016-01-21 LAB — URINALYSIS, ROUTINE W REFLEX MICROSCOPIC
BILIRUBIN URINE: NEGATIVE
GLUCOSE, UA: 100 mg/dL — AB
KETONES UR: NEGATIVE mg/dL
LEUKOCYTES UA: NEGATIVE
Nitrite: NEGATIVE
PH: 6 (ref 5.0–8.0)
PROTEIN: NEGATIVE mg/dL
Specific Gravity, Urine: 1.02 (ref 1.005–1.030)

## 2016-01-21 LAB — URINE MICROSCOPIC-ADD ON

## 2016-01-21 LAB — CBC WITH DIFFERENTIAL/PLATELET
BASOS ABS: 0 10*3/uL (ref 0.0–0.1)
Basophils Relative: 0 %
EOS PCT: 1 %
Eosinophils Absolute: 0.1 10*3/uL (ref 0.0–0.7)
HEMATOCRIT: 38.8 % (ref 36.0–46.0)
Hemoglobin: 12.4 g/dL (ref 12.0–15.0)
LYMPHS ABS: 1.2 10*3/uL (ref 0.7–4.0)
LYMPHS PCT: 8 %
MCH: 27.4 pg (ref 26.0–34.0)
MCHC: 32 g/dL (ref 30.0–36.0)
MCV: 85.8 fL (ref 78.0–100.0)
MONO ABS: 1.2 10*3/uL — AB (ref 0.1–1.0)
MONOS PCT: 8 %
NEUTROS ABS: 11.9 10*3/uL — AB (ref 1.7–7.7)
Neutrophils Relative %: 83 %
PLATELETS: 219 10*3/uL (ref 150–400)
RBC: 4.52 MIL/uL (ref 3.87–5.11)
RDW: 15.4 % (ref 11.5–15.5)
WBC: 14.4 10*3/uL — ABNORMAL HIGH (ref 4.0–10.5)

## 2016-01-21 LAB — COMPREHENSIVE METABOLIC PANEL
ALT: 13 U/L — AB (ref 14–54)
AST: 21 U/L (ref 15–41)
Albumin: 3.7 g/dL (ref 3.5–5.0)
Alkaline Phosphatase: 68 U/L (ref 38–126)
Anion gap: 8 (ref 5–15)
BUN: 17 mg/dL (ref 6–20)
CALCIUM: 8.9 mg/dL (ref 8.9–10.3)
CHLORIDE: 107 mmol/L (ref 101–111)
CO2: 26 mmol/L (ref 22–32)
CREATININE: 0.82 mg/dL (ref 0.44–1.00)
GFR calc non Af Amer: >60 mL/min (ref >60)
Glucose, Bld: 150 mg/dL — ABNORMAL HIGH (ref 65–99)
Potassium: 3.4 mmol/L — ABNORMAL LOW (ref 3.5–5.1)
SODIUM: 141 mmol/L (ref 135–145)
Total Bilirubin: 0.6 mg/dL (ref 0.3–1.2)
Total Protein: 6.8 g/dL (ref 6.5–8.1)

## 2016-01-21 LAB — TROPONIN I

## 2016-01-21 LAB — LIPASE, BLOOD: Lipase: 24 U/L (ref 11–51)

## 2016-01-21 MED ORDER — ONDANSETRON HCL 4 MG/2ML IJ SOLN
4.0000 mg | Freq: Once | INTRAMUSCULAR | Status: AC
  Administered 2016-01-21: 4 mg via INTRAVENOUS
  Filled 2016-01-21: qty 2

## 2016-01-21 MED ORDER — PROMETHAZINE HCL 25 MG RE SUPP: 25.0000 mg | Freq: Four times a day (QID) | RECTAL | 0 refills | Status: DC | PRN

## 2016-01-21 MED ORDER — SODIUM CHLORIDE 0.9 % IV BOLUS (SEPSIS)
500.0000 mL | Freq: Once | INTRAVENOUS | Status: AC
  Administered 2016-01-21: 500 mL via INTRAVENOUS

## 2016-01-21 MED ORDER — ONDANSETRON 4 MG PO TBDP: 4.0000 mg | ORAL_TABLET | Freq: Three times a day (TID) | ORAL | 0 refills | Status: DC | PRN

## 2016-01-21 MED ORDER — PROMETHAZINE HCL 12.5 MG PO TABS: 12.5000 mg | ORAL_TABLET | Freq: Four times a day (QID) | ORAL | 0 refills | Status: DC | PRN

## 2016-01-21 MED ORDER — METOCLOPRAMIDE HCL 5 MG/ML IJ SOLN: 10.0000 mg | Freq: Once | INTRAMUSCULAR | Status: DC

## 2016-01-21 MED ORDER — METOCLOPRAMIDE HCL 5 MG/ML IJ SOLN
10.0000 mg | Freq: Once | INTRAMUSCULAR | Status: AC
  Administered 2016-01-21: 10 mg via INTRAMUSCULAR
  Filled 2016-01-21: qty 2

## 2016-01-21 MED ORDER — PROMETHAZINE HCL 25 MG/ML IJ SOLN
12.5000 mg | Freq: Once | INTRAMUSCULAR | Status: AC
  Administered 2016-01-21: 12.5 mg via INTRAVENOUS
  Filled 2016-01-21: qty 1

## 2016-01-21 NOTE — ED Notes (Addendum)
Pt continually dry heaving and hack like cough sats 79 -83%. Head of bed elevated O2 2 L initiated . Dr Sabra Heck notified

## 2016-01-21 NOTE — Discharge Instructions (Signed)

## 2016-01-21 NOTE — ED Provider Notes (Signed)
[facility] DEPT Provider Note   CSN: [id number] Arrival date & time: 01/21/16  0855  First Provider Contact:   First MD Initiated Contact with Patient 01/21/16 0905     By signing my name below, I, [name], attest that this documentation has been prepared under the direction and in the presence of [name] Chapel, MD. Electronically Signed: Eustaquio [name], ED Scribe. 01/21/16. 9:06 AM.  History   Chief Complaint Chief Complaint  Patient presents with  . Emesis    HPI [name] is a 75 y.o. female brought in by ambulance, with PMHx HTN, DM, HLD, IBS, atrial fibrillation, CAD, MI, who presents to the Emergency Department complaining of nausea and dry heaving that began at 12 AM last night (approximately 9 hours ago). She reports that she ate chinese food last night prior to her symptoms starting but does not believe it is the cause of her symptoms. She also mentions have 3 episodes of soft stool since last night. Per EMS, pt was found to be hypertensive upon arrival with BP of 200/90 but EMS reports that pt missed her antihypertensive medication this morning. Pt was given Zofran en route. No recent foreign travel, recent sick contact, recent hospital visits, or recent change in medications. Pt lives alone. No hx stroke, kidney problems, liver problems. No EtOH or illicit drug use. Denies headache, blurry vision, neck pain, chest pain, cough, shortness of breath, abdominal pain, back pain, leg swelling, numbness, weakness, difficulty urinating, diarrhea, hematochezia, or any other associated symptoms.    The history is provided by the patient. No language interpreter was used.    Past Medical History:  Diagnosis Date  . Allergy   . Allergy history unknown   . Anticoagulated   . ASCVD (arteriosclerotic cardiovascular disease)    stent to mid and proximal left anterior descending in 06/2002;drug eluting stent placed in the second diagnol in 08/2003 after  A  non-st elevation  myocardial infarction   . Asthma   . Atrial fibrillation ([place])   . Chronic anticoagulation   . Chronic pain of left knee   . Coronary artery disease   . Diabetes mellitus    no insulin  . Diarrhea    acute  . Diarrhea   . FH: colonic polyps    adenomataous  . GERD (gastroesophageal reflux disease)   . Hyperlipidemia    pulmonary embolism 2000 and 09/2008  . Hypertension   . Hypothyroidism   . Insomnia   . Irritable bowel syndrome   . Paroxysmal atrial fibrillation (HCC)    normal LV function; episodes occurred in 205 and 09/2007  . Pedal edema   . Peripheral edema   . Pulmonary embolism ([place])    2000/09/2008  . Rectal bleeding   . Thyroid disease    hypothyroidism  . Tobacco user    stopped   . Venous stasis     Patient Active Problem List   Diagnosis Date Noted  . Nausea without vomiting 05/09/2014  . Melena 05/08/2014  . UGI bleed 05/08/2014  . Dysphagia, unspecified(787.20) 10/26/2013  . Difficulty in walking(719.7) 10/19/2012  . Weakness of left leg 10/19/2012  . Long term current use of anticoagulant therapy 09/26/2012  . Dysphagia 05/04/2012  . Acute renal failure ([place]) 08/10/2011  . Microcytic anemia 08/10/2011  . Acute bronchitis 08/09/2011  . Acute respiratory failure ([place]) 08/09/2011  . IBS (irritable bowel syndrome) 08/09/2011  . Elevated serum creatinine 08/09/2011  . Chest pressure 03/05/2011  . DOE (dyspnea on  exertion) 03/05/2011  . Anemia 03/05/2011  . Peripheral neuropathy ([place]) 10/02/2010  . 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 Hypothyroidism 12/02/2008  . DM 12/02/2008  . Hyperlipidemia LDL goal <70 12/02/2008  . Essential hypertension 12/02/2008  . Coronary atherosclerosis 12/02/2008  . FIBRILLATION, ATRIAL 12/02/2008  . Asthma 12/02/2008  . GERD 12/02/2008  . TOBACCO USE, QUIT 12/02/2008    Past Surgical History:  Procedure Laterality Date  . ABDOMINAL HYSTERECTOMY    . APPENDECTOMY    .  BALLOON DILATION N/A 11/11/2013   Procedure: BALLOON DILATION;  Surgeon: Rogene Houston, MD;  Location: [facility];  Service: Endoscopy;  Laterality: N/A;  . BREAST REDUCTION SURGERY    . BREAST SURGERY    . CHOLECYSTECTOMY    . CLEFT PALATE REPAIR     4 surgeries  . COLONOSCOPY  2011   negative  . COLONOSCOPY N/A 05/10/2014   Procedure: COLONOSCOPY;  Surgeon: Rogene Houston, MD;  Location: [facility];  Service: Endoscopy;  Laterality: N/A;  . ESOPHAGOGASTRODUODENOSCOPY N/A 11/11/2013   Procedure: ESOPHAGOGASTRODUODENOSCOPY (EGD);  Surgeon: Rogene Houston, MD;  Location: [facility];  Service: Endoscopy;  Laterality: N/A;  200-moved to 100 Ann notified pt  . ESOPHAGOGASTRODUODENOSCOPY N/A 05/09/2014   Procedure: ESOPHAGOGASTRODUODENOSCOPY (EGD);  Surgeon: Rogene Houston, MD;  Location: [facility];  Service: Endoscopy;  Laterality: N/A;  . HEEL SPUR SURGERY    . KNEE SURGERY    . MALONEY DILATION N/A 11/11/2013   Procedure: Venia Minks DILATION;  Surgeon: Rogene Houston, MD;  Location: [facility];  Service: Endoscopy;  Laterality: N/A;  . OOPHORECTOMY    . SAVORY DILATION N/A 11/11/2013   Procedure: SAVORY DILATION;  Surgeon: Rogene Houston, MD;  Location: [facility];  Service: Endoscopy;  Laterality: N/A;    OB History    No data available       Home Medications    Prior to Admission medications   Medication Sig Start Date End Date Taking? Authorizing Provider  acetaminophen (TYLENOL) 500 MG tablet Take 500 mg by mouth daily as needed for headache.   Yes Historical Provider, MD  albuterol (PROAIR HFA) 108 (90 Base) MCG/ACT inhaler INHALE 2 PUFFS UP TO FOUR TIMES DAILY AS NEEDED FOR WHEEZING. 07/14/15  Yes Kathyrn Drown, MD  albuterol (PROVENTIL) (2.5 MG/3ML) 0.083% nebulizer solution Take 3 mLs (2.5 mg total) by nebulization every 6 (six) hours as needed for wheezing or shortness of breath. 05/24/15  Yes Kathyrn Drown, MD  atorvastatin (LIPITOR) 40 MG tablet Take  1 tablet (40 mg total) by mouth daily. 07/28/15  Yes Herminio Commons, MD  Cholecalciferol (VITAMIN D3 PO) Take 1 tablet by mouth daily.   Yes Historical Provider, MD  diltiazem (CARDIZEM CD) 180 MG 24 hr capsule TAKE 1 CAPSULE BY MOUTH ONCE A DAY. 07/28/15  Yes Herminio Commons, MD  diphenoxylate-atropine (LOMOTIL) 2.5-0.025 MG tablet TAKE 1 TABLET BY MOUTH TWICE DAILY AS NEEDED. 11/13/15  Yes Mikey Kirschner, MD  escitalopram (LEXAPRO) 20 MG tablet TAKE ONE TABLET BY MOUTH ONCE DAILY. 01/02/16  Yes Mikey Kirschner, MD  fluticasone (FLOVENT HFA) 110 MCG/ACT inhaler Inhale 2 puffs twice a day 08/30/15  Yes Mikey Kirschner, MD  fluticasone-salmeterol (ADVAIR HFA) 230-21 MCG/ACT inhaler Inhale 2 puffs into the lungs 2 (two) times daily. 08/30/15  Yes Mikey Kirschner, MD  levothyroxine (SYNTHROID, LEVOTHROID) 50 MCG tablet Take 1 tablet (50 mcg total)  by mouth daily. 07/14/15  Yes Kathyrn Drown, MD  Oxycodone HCl 10 MG TABS Take one tablet up to qid prn pain Patient taking differently: Take 10 mg by mouth 2 (two) times daily. Take one tablet up to qid prn pain 01/03/16  Yes Mikey Kirschner, MD  pantoprazole (PROTONIX) 40 MG tablet Take 1 tablet (40 mg total) by mouth 2 (two) times daily before a meal. Patient taking differently: Take 40 mg by mouth 2 (two) times daily.  11/11/13  Yes Rogene Houston, MD  predniSONE (DELTASONE) 5 MG tablet One tablet a day for 3 days, then 2 tablets a day Patient taking differently: Take 5 mg by mouth daily with breakfast. 5 mg every day except 2.5 mg on Monday's. 09/28/15  Yes Mikey Kirschner, MD  valsartan (DIOVAN) 320 MG tablet TAKE (1) TABLET BY MOUTH ONCE DAILY. 07/28/15  Yes Herminio Commons, MD  warfarin (COUMADIN) 5 MG tablet TAKE ONE TABLET BY MOUTH ONCE DAILY EXCEPT 1/2 TABLET ON TUESDAY AND FRIDAY. 12/11/15  Yes Mikey Kirschner, MD  warfarin (COUMADIN) 5 MG tablet Take 2.5-5 mg by mouth daily. 5 mg daily except 2.5 mg on Monday.   Yes Historical Provider, MD    ondansetron (ZOFRAN ODT) 4 MG disintegrating tablet Take 1 tablet (4 mg total) by mouth every 8 (eight) hours as needed for nausea. 01/21/16   [name] Chapel, MD  promethazine (PHENERGAN) 12.5 MG tablet Take 1 tablet (12.5 mg total) by mouth every 6 (six) hours as needed for nausea or vomiting. 01/21/16   [name] Chapel, MD  promethazine (PHENERGAN) 25 MG suppository Place 1 suppository (25 mg total) rectally every 6 (six) hours as needed for nausea or vomiting. 01/21/16   [name] Chapel, MD    Family History Family History  Problem Relation Age of Onset  . Heart attack Mother   . Diabetes Mother   . Hyperlipidemia Mother   . Heart attack Father   . Lung cancer Sister   . Lymphoma Brother   . Colon cancer Neg Hx     Social History Social History  Substance Use Topics  . Smoking status: Former Smoker    Packs/day: 2.00    Years: 30.00    Types: Cigarettes    Start date: 05/05/1987  . Smokeless tobacco: Never Used  . Alcohol use No     Allergies   Demerol   Review of Systems Review of Systems  Eyes: Negative for visual disturbance.  Respiratory: Negative for cough and shortness of breath.   Cardiovascular: Negative for chest pain.  Gastrointestinal: Positive for nausea. Negative for blood in stool, diarrhea and vomiting.  Genitourinary: Negative for difficulty urinating.  Neurological: Negative for weakness and numbness.  All other systems reviewed and are negative.    Physical Exam Updated Vital Signs BP 182/88   Pulse 62   Temp 98.2 F (36.8 C) (Oral)   Resp 19   Ht 5\' 1"  (1.549 m)   Wt 210 lb (95.3 kg)   SpO2 99%   BMI 39.68 kg/m   Physical Exam  Constitutional: She appears well-developed and well-nourished. No distress.  HENT:  Head: Normocephalic and atraumatic.  Mouth/Throat: Oropharynx is clear and moist. No oropharyngeal exudate.  Eyes: Conjunctivae and EOM are normal. Pupils are equal, round, and reactive to light. Right eye exhibits no discharge. Left  eye exhibits no discharge. No scleral icterus.  Neck: Normal range of motion. Neck supple. No JVD present. No thyromegaly present.  Cardiovascular: Regular  rhythm, normal heart sounds and intact distal pulses.  Bradycardia present.  Exam reveals no gallop and no friction rub.   No murmur heard. Mild sinus bradycardia   Pulmonary/Chest: Effort normal and breath sounds normal. No respiratory distress. She has no wheezes. She has no rales.  Abdominal: Soft. Bowel sounds are normal. She exhibits no distension and no mass. There is no tenderness.  Musculoskeletal: Normal range of motion. She exhibits no edema or tenderness.  Lymphadenopathy:    She has no cervical adenopathy.  Neurological: She is alert. Coordination normal.  Skin: Skin is warm and dry. No rash noted. No erythema.  Psychiatric: She has a normal mood and affect. Her behavior is normal.  Nursing note and vitals reviewed.    ED Treatments / Results    DIAGNOSTIC STUDIES: Oxygen Saturation is 96% on RA, normal by my interpretation.    COORDINATION OF CARE: 9:05 AM-Discussed treatment plan with pt at bedside and pt agreed to plan.   Labs (all labs ordered are listed, but only abnormal results are displayed) Labs Reviewed  COMPREHENSIVE METABOLIC PANEL - Abnormal; Notable for the following:       Result Value   Potassium 3.4 (*)    Glucose, Bld 150 (*)    ALT 13 (*)    All other components within normal limits  CBC WITH DIFFERENTIAL/PLATELET - Abnormal; Notable for the following:    WBC 14.4 (*)    Neutro Abs 11.9 (*)    Monocytes Absolute 1.2 (*)    All other components within normal limits  URINALYSIS, ROUTINE W REFLEX MICROSCOPIC (NOT AT [facility]) - Abnormal; Notable for the following:    Glucose, UA 100 (*)    Hgb urine dipstick TRACE (*)    All other components within normal limits  URINE MICROSCOPIC-ADD ON - Abnormal; Notable for the following:    Squamous Epithelial / LPF 0-5 (*)    Bacteria, UA RARE (*)    All  other components within normal limits  LIPASE, BLOOD  TROPONIN I    EKG  EKG Interpretation  Date/Time:  Sunday January 21 2016 09:18:33 EDT Ventricular Rate:  55 PR Interval:    QRS Duration: 102 QT Interval:  463 QTC Calculation: 443 R Axis:   5 Text Interpretation:  Sinus rhythm Ventricular premature complex Low voltage, precordial leads Since last tracing rate slower Confirmed by Crissy Mccreadie  MD, Georgean Spainhower ([id number]) on 01/21/2016 9:46:18 AM       Radiology No results found.  Procedures Procedures (including critical care time)  Medications Ordered in ED Medications  promethazine (PHENERGAN) injection 12.5 mg (12.5 mg Intravenous Given 01/21/16 0921)  sodium chloride 0.9 % bolus 500 mL (0 mLs Intravenous Stopped 01/21/16 1218)  ondansetron (ZOFRAN) injection 4 mg (4 mg Intravenous Given 01/21/16 1200)  metoCLOPramide (REGLAN) injection 10 mg (10 mg Intramuscular Given 01/21/16 1412)     Initial Impression / Assessment and Plan / ED Course  I have reviewed the triage vital signs and the nursing notes.  Pertinent labs & imaging results that were available during my care of the patient were reviewed by me and considered in my medical decision making (see chart for details).  Clinical Course  Comment By Time  On repeat evaluation the patient feels better, she still has not vomited, her heart rate is down to 100, her blood pressure is still elevated but she didn't take her home medications. She denies chest pain, shortness of breath, headache, has no neurologic symptoms and ongoing workup has  no signs of urinary tract infection, significant electrolyte abnormalities but does have a slight leukocytosis. She can follow-up in the outpatient setting [name] Chapel, MD 07/30 1520    I personally performed the services described in this documentation, which was scribed in my presence. The recorded information has been reviewed and is accurate.   The pt was cautioned to return if her symptoms were  to worsen - she is agreeable  Vitals:   01/21/16 1200 01/21/16 1230 01/21/16 1300 01/21/16 1330  BP: 191/90 193/94 180/90 182/88  Pulse: (!) 57  62   Resp: 21 24 12 19   Temp:      TempSrc:      SpO2: 98%  99%   Weight:      Height:          Final Clinical Impressions(s) / ED Diagnoses   Final diagnoses:  Nauseated    New Prescriptions New Prescriptions   ONDANSETRON (ZOFRAN ODT) 4 MG DISINTEGRATING TABLET    Take 1 tablet (4 mg total) by mouth every 8 (eight) hours as needed for nausea.   PROMETHAZINE (PHENERGAN) 12.5 MG TABLET    Take 1 tablet (12.5 mg total) by mouth every 6 (six) hours as needed for nausea or vomiting.   PROMETHAZINE (PHENERGAN) 25 MG SUPPOSITORY    Place 1 suppository (25 mg total) rectally every 6 (six) hours as needed for nausea or vomiting.     [name] Chapel, MD 01/21/16 [phone]

## 2016-01-21 NOTE — ED Triage Notes (Signed)
Pt reports that she started vomiting and having soft stools approx 12 am last night. Vomited continuously until nothing but mucous and had 3 soft stools.  Given 4 mg Zoran enroute without reliel. Has Hx of IBS.

## 2016-01-21 NOTE — ED Notes (Signed)
IV site infiltrated  And removed. No redness noted Slight swelling at site

## 2016-01-21 NOTE — ED Notes (Signed)
Patient aware a urine specimen is needed.

## 2016-01-29 ENCOUNTER — Other Ambulatory Visit: Payer: Self-pay | Admitting: Family Medicine

## 2016-01-31 ENCOUNTER — Ambulatory Visit (INDEPENDENT_AMBULATORY_CARE_PROVIDER_SITE_OTHER): Payer: Medicare HMO | Admitting: *Deleted

## 2016-01-31 DIAGNOSIS — Z7901 Long term (current) use of anticoagulants: Secondary | ICD-10-CM

## 2016-01-31 LAB — POCT INR: INR: 3

## 2016-01-31 NOTE — Patient Instructions (Signed)
Coumadin 5mg  tablets. Take one whole tablet every day except on mondays take one half tablet. Recheck INR in 4 weeks.

## 2016-02-01 ENCOUNTER — Ambulatory Visit (INDEPENDENT_AMBULATORY_CARE_PROVIDER_SITE_OTHER): Payer: Medicare HMO | Admitting: Internal Medicine

## 2016-02-01 ENCOUNTER — Encounter (INDEPENDENT_AMBULATORY_CARE_PROVIDER_SITE_OTHER): Payer: Self-pay | Admitting: Internal Medicine

## 2016-02-01 ENCOUNTER — Other Ambulatory Visit (INDEPENDENT_AMBULATORY_CARE_PROVIDER_SITE_OTHER): Payer: Self-pay | Admitting: *Deleted

## 2016-02-01 VITALS — BP 112/86 | HR 56 | Temp 98.7°F | Ht 61.0 in | Wt 210.2 lb

## 2016-02-01 DIAGNOSIS — R110 Nausea: Secondary | ICD-10-CM

## 2016-02-01 DIAGNOSIS — K5909 Other constipation: Secondary | ICD-10-CM

## 2016-02-01 LAB — CBC
HEMATOCRIT: 39.4 % (ref 35.0–45.0)
HEMOGLOBIN: 12.5 g/dL (ref 11.7–15.5)
MCH: 26.8 pg — AB (ref 27.0–33.0)
MCHC: 31.7 g/dL — AB (ref 32.0–36.0)
MCV: 84.4 fL (ref 80.0–100.0)
MPV: 9.5 fL (ref 7.5–12.5)
Platelets: 365 10*3/uL (ref 140–400)
RBC: 4.67 MIL/uL (ref 3.80–5.10)
RDW: 15.4 % — AB (ref 11.0–15.0)
WBC: 11.5 10*3/uL — ABNORMAL HIGH (ref 3.8–10.8)

## 2016-02-01 NOTE — Progress Notes (Signed)
Subjective:    Patient ID: [name], female    DOB: 24-Jan-1941, 75 y.o.   MRN: [id number]  HPIHere today for f/u after recent visit to [facility]  On 7/30/2017for N and V x 9 hrs. She had eaten chinese the night before her symptoms started, but per records she did not feel this caused her symptoms. She had dry heaves. She tells me she still has some nausea. She has frequent nausea.  Her appetite is not good, though she has gained 7 pounds since her visit in November. She has not vomited any blood.  She occasionally has pain in her lower abdomen when she has to have a BM.  She has a BM about every 3-4 days and she describes as ball like. She takes Oxycodone BID. No melena or BRRB.  01/21/2016 Troponin was normal.  Acid reflux controlled with Protonix. No NSAIDs. CBC    Component Value Date/Time   WBC 14.4 (H) 01/21/2016 0925   RBC 4.52 01/21/2016 0925   HGB 12.4 01/21/2016 0925   HCT 38.8 01/21/2016 0925   PLT 219 01/21/2016 0925   MCV 85.8 01/21/2016 0925   MCH 27.4 01/21/2016 0925   MCHC 32.0 01/21/2016 0925   RDW 15.4 01/21/2016 0925   RDW 16.9 (H) 09/23/2014 1205   LYMPHSABS 1.2 01/21/2016 0925   LYMPHSABS 2.5 09/23/2014 1205   MONOABS 1.2 (H) 01/21/2016 0925   EOSABS 0.1 01/21/2016 0925   EOSABS 0.3 09/23/2014 1205   BASOSABS 0.0 01/21/2016 0925   BASOSABS 0.0 09/23/2014 1205     CMP Latest Ref Rng & Units 01/21/2016 09/20/2015 08/17/2015  Glucose 65 - 99 mg/dL 150(H) 101(H) 128(H)  BUN 6 - 20 mg/dL 17 11 13   Creatinine 0.44 - 1.00 mg/dL 0.82 0.79 0.84  Sodium 135 - 145 mmol/L 141 144 144  Potassium 3.5 - 5.1 mmol/L 3.4(L) 4.2 3.2(L)  Chloride 101 - 111 mmol/L 107 101 109  CO2 22 - 32 mmol/L 26 27 27   Calcium 8.9 - 10.3 mg/dL 8.9 9.4 8.9  Total Protein 6.5 - 8.1 g/dL 6.8 6.2 -  Total Bilirubin 0.3 - 1.2 mg/dL 0.6 0.3 -  Alkaline Phos 38 - 126 U/L 68 81 -  AST 15 - 41 U/L 21 18 -  ALT 14 - 54 U/L 13(L) 7 -     05/10/2014 Colonoscopy  Indications: Patient is  75 year old Caucasian female who presents with melena and anemia. Patient is chronically anticoagulated. She was scheduled to undergo elective colonoscopy on 05/11/2014 and was being bridged with Lovenox while warfarin was on hold. She underwent diagnostic EGD which revealed no bleeding source and gastric ulcer had completely healed. She is therefore undergoing diagnostic colonoscopy. He also has chronic diarrhea felt to be due to IBS. She has lost 10 pounds since March.    05/09/2014 EGD  Indications: Patient is 75 year old Caucasian female who presents with melena. She is anticoagulated because of history of pulmonary embolism. She was found to have prepyloric ulcer back in May 2015. She is undergoing diagnostic EGD.  Complications: none  Impression: Gastric ulcer( May 2015) has completely healed. Two antral erosions. No bleeding lesions identified on this exam            Review of Systems Past Medical History:  Diagnosis Date  . Allergy   . Allergy history unknown   . Anticoagulated   . ASCVD (arteriosclerotic cardiovascular disease)    stent to mid and proximal left anterior descending in 06/2002;drug eluting stent placed  in the second diagnol in 08/2003 after  A  non-st elevation myocardial infarction   . Asthma   . Atrial fibrillation ([place])   . Chronic anticoagulation   . Chronic pain of left knee   . Coronary artery disease   . Diabetes mellitus    no insulin  . Diarrhea    acute  . Diarrhea   . FH: colonic polyps    adenomataous  . GERD (gastroesophageal reflux disease)   . Hyperlipidemia    pulmonary embolism 2000 and 09/2008  . Hypertension   . Hypothyroidism   . Insomnia   . Irritable bowel syndrome   . Paroxysmal atrial fibrillation (HCC)    normal LV function; episodes occurred in 205 and 09/2007  . Pedal edema   . Peripheral edema   .  Pulmonary embolism ([place])    2000/09/2008  . Rectal bleeding   . Thyroid disease    hypothyroidism  . Tobacco user    stopped   . Venous stasis     Past Surgical History:  Procedure Laterality Date  . ABDOMINAL HYSTERECTOMY    . APPENDECTOMY    . BALLOON DILATION N/A 11/11/2013   Procedure: BALLOON DILATION;  Surgeon: Rogene Houston, MD;  Location: [facility];  Service: Endoscopy;  Laterality: N/A;  . BREAST REDUCTION SURGERY    . BREAST SURGERY    . CHOLECYSTECTOMY    . CLEFT PALATE REPAIR     4 surgeries  . COLONOSCOPY  2011   negative  . COLONOSCOPY N/A 05/10/2014   Procedure: COLONOSCOPY;  Surgeon: Rogene Houston, MD;  [facility];  Service: Endoscopy;  Laterality: N/A;  . ESOPHAGOGASTRODUODENOSCOPY N/A 11/11/2013   Procedure: ESOPHAGOGASTRODUODENOSCOPY (EGD);  Surgeon: Rogene Houston, MD;  [facility];  Service: Endoscopy;  Laterality: N/A;  200-moved to 100 Ann notified pt  . ESOPHAGOGASTRODUODENOSCOPY N/A 05/09/2014   Procedure: ESOPHAGOGASTRODUODENOSCOPY (EGD);  Surgeon: Rogene Houston, MD;  [facility];  Service: Endoscopy;  Laterality: N/A;  . HEEL SPUR SURGERY    . KNEE SURGERY    . MALONEY DILATION N/A 11/11/2013   Procedure: Venia Minks DILATION;  Surgeon: Rogene Houston, MD;  Location: [facility];  Service: Endoscopy;  Laterality: N/A;  . OOPHORECTOMY    . SAVORY DILATION N/A 11/11/2013   Procedure: SAVORY DILATION;  Surgeon: Rogene Houston, MD;  Location: [facility];  Service: Endoscopy;  Laterality: N/A;    Allergies  Allergen Reactions  . Demerol Nausea And Vomiting    Current Outpatient Prescriptions on File Prior to Visit  Medication Sig Dispense Refill  . acetaminophen (TYLENOL) 500 MG tablet Take 500 mg by mouth daily as needed for headache.    . albuterol (PROAIR HFA) 108 (90 Base) MCG/ACT inhaler INHALE 2 PUFFS UP TO FOUR TIMES DAILY AS NEEDED FOR WHEEZING. 8.5 g 2  . albuterol (PROVENTIL) (2.5 MG/3ML)  0.083% nebulizer solution Take 3 mLs (2.5 mg total) by nebulization every 6 (six) hours as needed for wheezing or shortness of breath. 75 mL 5  . atorvastatin (LIPITOR) 40 MG tablet Take 1 tablet (40 mg total) by mouth daily. 90 tablet 3  . Cholecalciferol (VITAMIN D3 PO) Take 1 tablet by mouth daily.    Marland Kitchen diltiazem (CARDIZEM CD) 180 MG 24 hr capsule TAKE 1 CAPSULE BY MOUTH ONCE A DAY. 30 capsule 11  . diphenoxylate-atropine (LOMOTIL) 2.5-0.025 MG tablet TAKE 1 TABLET BY MOUTH TWICE DAILY AS NEEDED. 60 tablet 5  .  escitalopram (LEXAPRO) 20 MG tablet TAKE ONE TABLET BY MOUTH ONCE DAILY. 30 tablet 0  . fluticasone (FLOVENT HFA) 110 MCG/ACT inhaler Inhale 2 puffs twice a day 1 Inhaler 6  . fluticasone-salmeterol (ADVAIR HFA) 230-21 MCG/ACT inhaler Inhale 2 puffs into the lungs 2 (two) times daily. 1 Inhaler 12  . levothyroxine (SYNTHROID, LEVOTHROID) 50 MCG tablet TAKE ONE TABLET BY MOUTH ONCE DAILY. 30 tablet 5  . ondansetron (ZOFRAN ODT) 4 MG disintegrating tablet Take 1 tablet (4 mg total) by mouth every 8 (eight) hours as needed for nausea. 10 tablet 0  . Oxycodone HCl 10 MG TABS Take one tablet up to qid prn pain (Patient taking differently: Take 10 mg by mouth 2 (two) times daily. Take one tablet up to qid prn pain) 120 tablet 0  . pantoprazole (PROTONIX) 40 MG tablet Take 1 tablet (40 mg total) by mouth 2 (two) times daily before a meal. (Patient taking differently: Take 40 mg by mouth 2 (two) times daily. ) 60 tablet 3  . predniSONE (DELTASONE) 5 MG tablet One tablet a day for 3 days, then 2 tablets a day (Patient taking differently: Take 5 mg by mouth daily with breakfast. 5 mg every day except 2.5 mg on Monday's.) 60 tablet 5  . promethazine (PHENERGAN) 12.5 MG tablet Take 1 tablet (12.5 mg total) by mouth every 6 (six) hours as needed for nausea or vomiting. 12 tablet 0  . valsartan (DIOVAN) 320 MG tablet TAKE (1) TABLET BY MOUTH ONCE DAILY. 30 tablet 11  . warfarin (COUMADIN) 5 MG tablet TAKE  ONE TABLET BY MOUTH ONCE DAILY EXCEPT 1/2 TABLET ON TUESDAY AND FRIDAY. 30 tablet 5  . warfarin (COUMADIN) 5 MG tablet Take 2.5-5 mg by mouth daily. 5 mg daily except 2.5 mg on Monday.    . [DISCONTINUED] ferrous sulfate 325 (65 FE) MG tablet Take 1 tablet (325 mg total) by mouth daily with breakfast. IRON SUPPLEMENT.    . [DISCONTINUED] ipratropium (ATROVENT) 0.02 % nebulizer solution Take 500 mcg by nebulization every 4 (four) hours as needed. For shortness of breath     No current facility-administered medications on file prior to visit.        Objective:   Physical Exam Blood pressure 112/86, pulse (!) 56, temperature 98.7 F (37.1 C), height 5\' 1"  (1.549 m), weight 210 lb 3.2 oz (95.3 kg).  Alert and oriented. Skin warm and dry. Oral mucosa is moist.   . Sclera anicteric, conjunctivae is pink. Thyroid not enlarged. No cervical lymphadenopathy. Lungs clear. Heart regular rate and rhythm.  Abdomen is soft. Bowel sounds are positive. No hepatomegaly. No abdominal masses felt. No tenderness.  No edema to lower extremities.         Assessment & Plan:  Constipation: Needs to take a stool twice a day. Nausea:  Continue the Phenergan or Zofran as needed. OV in 6 months.

## 2016-02-01 NOTE — Patient Instructions (Signed)
CBC, and CMET today. Stool softener twice a day. OV in 6 months.

## 2016-02-02 LAB — COMPREHENSIVE METABOLIC PANEL
ALBUMIN: 3.7 g/dL (ref 3.6–5.1)
ALK PHOS: 65 U/L (ref 33–130)
ALT: 9 U/L (ref 6–29)
AST: 12 U/L (ref 10–35)
BILIRUBIN TOTAL: 0.3 mg/dL (ref 0.2–1.2)
BUN: 26 mg/dL — ABNORMAL HIGH (ref 7–25)
CO2: 27 mmol/L (ref 20–31)
CREATININE: 1.5 mg/dL — AB (ref 0.60–0.93)
Calcium: 9.5 mg/dL (ref 8.6–10.4)
Chloride: 102 mmol/L (ref 98–110)
GLUCOSE: 112 mg/dL — AB (ref 65–99)
Potassium: 5 mmol/L (ref 3.5–5.3)
Sodium: 141 mmol/L (ref 135–146)
TOTAL PROTEIN: 6.5 g/dL (ref 6.1–8.1)

## 2016-02-05 ENCOUNTER — Ambulatory Visit: Payer: Medicare Other | Admitting: Cardiovascular Disease

## 2016-02-08 ENCOUNTER — Ambulatory Visit: Payer: Medicare HMO | Admitting: Cardiovascular Disease

## 2016-02-10 DIAGNOSIS — J45998 Other asthma: Secondary | ICD-10-CM | POA: Diagnosis not present

## 2016-02-14 ENCOUNTER — Ambulatory Visit (INDEPENDENT_AMBULATORY_CARE_PROVIDER_SITE_OTHER): Payer: Medicare HMO | Admitting: Family Medicine

## 2016-02-14 ENCOUNTER — Encounter: Payer: Self-pay | Admitting: Family Medicine

## 2016-02-14 VITALS — BP 110/80 | Temp 98.2°F | Ht 66.0 in | Wt 210.0 lb

## 2016-02-14 DIAGNOSIS — R1310 Dysphagia, unspecified: Secondary | ICD-10-CM

## 2016-02-14 DIAGNOSIS — R110 Nausea: Secondary | ICD-10-CM

## 2016-02-14 DIAGNOSIS — K219 Gastro-esophageal reflux disease without esophagitis: Secondary | ICD-10-CM | POA: Diagnosis not present

## 2016-02-14 NOTE — Progress Notes (Signed)
   Subjective:    Patient ID: [name], female    DOB: 09-04-1940, 75 y.o.   MRN: [id number]  Abdominal Pain  This is a new problem. The current episode started more than 1 month ago. The problem occurs intermittently. The problem has been unchanged. The pain is located in the generalized abdominal region. The pain is moderate. Associated symptoms include nausea. Pertinent negatives include no fever. Nothing aggravates the pain. The pain is relieved by nothing. Treatments tried: nausea meds  The treatment provided no relief.  She relates severe epigastric pain she relates constant nausea. She states when she eats she feels pain and discomfort in the epigastrium she also relates dysphagia and feels that foods get stuck on a regular basis no vomiting with this.    Review of Systems  Constitutional: Positive for fatigue. Negative for fever.  HENT: Negative for congestion.   Respiratory: Negative for cough and shortness of breath.   Gastrointestinal: Positive for abdominal pain and nausea.       Objective:   Physical Exam Lungs clear hearts regular abdomen soft with moderate tenderness in epigastrium region       Assessment & Plan:  Severe epigastric pain along with dysphagia. This patient needs EGD. We will message gastroenterology. Tried calling their office our office was already closed for the day. I do not feel this is emergent but I do believe this patient needs more than just taking PPI. Will leave further testing to gastroenterology.

## 2016-02-14 NOTE — Patient Instructions (Signed)
Pantoprazole 40mg  take one 2 times a day  May use Maalox   We need to get you back in with Dr Delle Reining may need a EGD

## 2016-02-15 LAB — CBC WITH DIFFERENTIAL/PLATELET
BASOS ABS: 0 10*3/uL (ref 0.0–0.2)
Basos: 0 %
EOS (ABSOLUTE): 0.2 10*3/uL (ref 0.0–0.4)
EOS: 2 %
Hematocrit: 40.3 % (ref 34.0–46.6)
Hemoglobin: 13.2 g/dL (ref 11.1–15.9)
IMMATURE GRANULOCYTES: 0 %
Immature Grans (Abs): 0 10*3/uL (ref 0.0–0.1)
Lymphocytes Absolute: 1.4 10*3/uL (ref 0.7–3.1)
Lymphs: 13 %
MCH: 27.3 pg (ref 26.6–33.0)
MCHC: 32.8 g/dL (ref 31.5–35.7)
MCV: 83 fL (ref 79–97)
MONOS ABS: 0.6 10*3/uL (ref 0.1–0.9)
Monocytes: 6 %
NEUTROS PCT: 79 %
Neutrophils Absolute: 8.6 10*3/uL — ABNORMAL HIGH (ref 1.4–7.0)
PLATELETS: 326 10*3/uL (ref 150–379)
RBC: 4.84 x10E6/uL (ref 3.77–5.28)
RDW: 15.5 % — AB (ref 12.3–15.4)
WBC: 10.8 10*3/uL (ref 3.4–10.8)

## 2016-02-15 LAB — LIPASE: Lipase: 20 U/L (ref 0–59)

## 2016-02-20 ENCOUNTER — Ambulatory Visit (INDEPENDENT_AMBULATORY_CARE_PROVIDER_SITE_OTHER): Payer: Medicare HMO | Admitting: Internal Medicine

## 2016-02-21 ENCOUNTER — Telehealth: Payer: Self-pay | Admitting: *Deleted

## 2016-02-21 ENCOUNTER — Other Ambulatory Visit (INDEPENDENT_AMBULATORY_CARE_PROVIDER_SITE_OTHER): Payer: Self-pay | Admitting: Internal Medicine

## 2016-02-21 ENCOUNTER — Encounter (INDEPENDENT_AMBULATORY_CARE_PROVIDER_SITE_OTHER): Payer: Self-pay | Admitting: Internal Medicine

## 2016-02-21 ENCOUNTER — Ambulatory Visit (INDEPENDENT_AMBULATORY_CARE_PROVIDER_SITE_OTHER): Payer: Medicare HMO | Admitting: Internal Medicine

## 2016-02-21 ENCOUNTER — Encounter (INDEPENDENT_AMBULATORY_CARE_PROVIDER_SITE_OTHER): Payer: Self-pay | Admitting: *Deleted

## 2016-02-21 VITALS — BP 132/66 | HR 66 | Temp 97.7°F | Ht 64.0 in | Wt 214.8 lb

## 2016-02-21 DIAGNOSIS — R110 Nausea: Secondary | ICD-10-CM

## 2016-02-21 DIAGNOSIS — R1319 Other dysphagia: Secondary | ICD-10-CM | POA: Diagnosis not present

## 2016-02-21 NOTE — Telephone Encounter (Signed)
Patient is scheduled for EGD and dilatation in mid September and is on coumadin and must stop coumadin 5 days before procedure

## 2016-02-21 NOTE — Progress Notes (Addendum)
Subjective:    Patient ID: [name], female    DOB: May 05, 1941, 75 y.o.   MRN: [id number]  HPI Presents today with c/o nausea. She says she cannot throw up. She say Dr.Luking last week. She says she has tenderness in her epigastric region and she has trouble swallowing. She says even water will come up. She has to chew her food well.   She has a BM about once a week and then she will have diarrhea. She alternates between constipation and diarrhea.  Atrial fib, PE and maintained on Warfarin.  Hx of chronic nausea. Diabetic for years (10 yrs or more)  05/09/2014 EGD: melena  Impression: Gastric ulcer( May 2015) has completely healed. Two antral erosions. No Procedure Date: 11/11/2013  Procedure:   EGD with ED.  Indications:  Patient is 75 year old Caucasian female who presents with dysphagia to solids and liquids. She has history of GERD but presently not on therapy and denies heartburn. She has undergone 2 esophageal dilations in the past most recently 5 years ago. Patient has history of pulmonary embolism. Warfarin was stopped 5 days ago in she is [place] with Lovenox dose was held this morning.                          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 High-grade ring at distal esophagus initially dilated with  scope and subsequently with  balloon to 16.5 mm. Small sliding hiatal hernia. Erosive antral gastritis with 3 x 5 mm ulcer in prepyloric region. Esophageal biopsy was taken post dilation looking for eosinophilic esophagitis.          Review of Systems Past Medical History:  Diagnosis Date  . Allergy   . Allergy history unknown   . Anticoagulated   . ASCVD (arteriosclerotic cardiovascular disease)    stent to mid and proximal left anterior descending in 06/2002;drug eluting stent placed in the second diagnol in 08/2003 after  A  non-st elevation myocardial infarction   . Asthma   . Atrial fibrillation ([place])     . Chronic anticoagulation   . Chronic pain of left knee   . Coronary artery disease   . Diabetes mellitus    no insulin  . Diarrhea    acute  . Diarrhea   . FH: colonic polyps    adenomataous  . GERD (gastroesophageal reflux disease)   . Hyperlipidemia    pulmonary embolism 2000 and 09/2008  . Hypertension   . Hypothyroidism   . Insomnia   . Irritable bowel syndrome   . Paroxysmal atrial fibrillation (HCC)    normal LV function; episodes occurred in 205 and 09/2007  . Pedal edema   . Peripheral edema   . Pulmonary embolism ([place])    2000/09/2008  . Rectal bleeding   . Thyroid disease    hypothyroidism  . Tobacco user    stopped   . Venous stasis     Past Surgical History:  Procedure Laterality Date  . ABDOMINAL HYSTERECTOMY    . APPENDECTOMY    . BALLOON DILATION N/A 11/11/2013   Procedure: BALLOON DILATION;  Surgeon: Rogene Houston, MD;  [facility];  Service: Endoscopy;  Laterality: N/A;  . BREAST REDUCTION SURGERY    . BREAST SURGERY    . CHOLECYSTECTOMY    . CLEFT PALATE REPAIR     4 surgeries  . COLONOSCOPY  2011   negative  . COLONOSCOPY N/A 05/10/2014  Procedure: COLONOSCOPY;  Surgeon: Rogene Houston, MD;  [facility];  Service: Endoscopy;  Laterality: N/A;  . ESOPHAGOGASTRODUODENOSCOPY N/A 11/11/2013   Procedure: ESOPHAGOGASTRODUODENOSCOPY (EGD);  Surgeon: Rogene Houston, MD;  [facility];  Service: Endoscopy;  Laterality: N/A;  200-moved to 100 Ann notified pt  . ESOPHAGOGASTRODUODENOSCOPY N/A 05/09/2014   Procedure: ESOPHAGOGASTRODUODENOSCOPY (EGD);  Surgeon: Rogene Houston, MD;  [facility];  Service: Endoscopy;  Laterality: N/A;  . HEEL SPUR SURGERY    . KNEE SURGERY    . MALONEY DILATION N/A 11/11/2013   Procedure: Venia Minks DILATION;  Surgeon: Rogene Houston, MD;  [facility];  Service: Endoscopy;  Laterality: N/A;  . OOPHORECTOMY    . SAVORY DILATION N/A 11/11/2013   Procedure: SAVORY  DILATION;  Surgeon: Rogene Houston, MD;  [facility];  Service: Endoscopy;  Laterality: N/A;    Allergies  Allergen Reactions  . Demerol Nausea And Vomiting    Current Outpatient Prescriptions on File Prior to Visit  Medication Sig Dispense Refill  . acetaminophen (TYLENOL) 500 MG tablet Take 500 mg by mouth daily as needed for headache.    . albuterol (PROAIR HFA) 108 (90 Base) MCG/ACT inhaler INHALE 2 PUFFS UP TO FOUR TIMES DAILY AS NEEDED FOR WHEEZING. 8.5 g 2  . albuterol (PROVENTIL) (2.5 MG/3ML) 0.083% nebulizer solution Take 3 mLs (2.5 mg total) by nebulization every 6 (six) hours as needed for wheezing or shortness of breath. 75 mL 5  . atorvastatin (LIPITOR) 40 MG tablet Take 1 tablet (40 mg total) by mouth daily. 90 tablet 3  . Cholecalciferol (VITAMIN D3 PO) Take 1 tablet by mouth daily.    Marland Kitchen diltiazem (CARDIZEM CD) 180 MG 24 hr capsule TAKE 1 CAPSULE BY MOUTH ONCE A DAY. 30 capsule 11  . diphenoxylate-atropine (LOMOTIL) 2.5-0.025 MG tablet TAKE 1 TABLET BY MOUTH TWICE DAILY AS NEEDED. 60 tablet 5  . fluticasone (FLOVENT HFA) 110 MCG/ACT inhaler Inhale 2 puffs twice a day 1 Inhaler 6  . fluticasone-salmeterol (ADVAIR HFA) 230-21 MCG/ACT inhaler Inhale 2 puffs into the lungs 2 (two) times daily. 1 Inhaler 12  . levothyroxine (SYNTHROID, LEVOTHROID) 50 MCG tablet TAKE ONE TABLET BY MOUTH ONCE DAILY. 30 tablet 5  . ondansetron (ZOFRAN ODT) 4 MG disintegrating tablet Take 1 tablet (4 mg total) by mouth every 8 (eight) hours as needed for nausea. 10 tablet 0  . Oxycodone HCl 10 MG TABS Take one tablet up to qid prn pain (Patient taking differently: Take 10 mg by mouth 2 (two) times daily. Take one tablet up to qid prn pain) 120 tablet 0  . pantoprazole (PROTONIX) 40 MG tablet Take 1 tablet (40 mg total) by mouth 2 (two) times daily before a meal. (Patient taking differently: Take 40 mg by mouth 2 (two) times daily. ) 60 tablet 3  . promethazine (PHENERGAN) 12.5 MG tablet Take  1 tablet (12.5 mg total) by mouth every 6 (six) hours as needed for nausea or vomiting. 12 tablet 0  . valsartan (DIOVAN) 320 MG tablet TAKE (1) TABLET BY MOUTH ONCE DAILY. 30 tablet 11  . warfarin (COUMADIN) 5 MG tablet TAKE ONE TABLET BY MOUTH ONCE DAILY EXCEPT 1/2 TABLET ON TUESDAY AND FRIDAY. 30 tablet 5  . warfarin (COUMADIN) 5 MG tablet Take 2.5-5 mg by mouth daily. 5 mg daily except 2.5 mg on Monday.    . predniSONE (DELTASONE) 5 MG tablet One tablet a day for 3 days,  then 2 tablets a day (Patient not taking: Reported on 02/21/2016) 60 tablet 5  . [DISCONTINUED] ferrous sulfate 325 (65 FE) MG tablet Take 1 tablet (325 mg total) by mouth daily with breakfast. IRON SUPPLEMENT.    . [DISCONTINUED] ipratropium (ATROVENT) 0.02 % nebulizer solution Take 500 mcg by nebulization every 4 (four) hours as needed. For shortness of breath     No current facility-administered medications on file prior to visit.        Objective:   Physical Exam Blood pressure 132/66, pulse 66, temperature 97.7 F (36.5 C), height 5\' 4"  (1.626 m), weight 214 lb 12.8 oz (97.4 kg). Alert and oriented. Skin warm and dry. Oral mucosa is moist.   . Sclera anicteric, conjunctivae is pink. Thyroid not enlarged. No cervical lymphadenopathy. Lungs clear. Heart regular rate and rhythm.  Abdomen is soft. Bowel sounds are positive. No hepatomegaly. No abdominal masses felt. No tenderness.  No edema to lower extremities.  Walks with a walker.         Assessment & Plan:  Solid food dysphagia. Stricture needs to be ruled. Hx of stricture in 2015. Nausea: PUD needs to be ruled out. Hx of pre-pyloric ulcer in 2015 which was documented to be healed on 2nd EGD.

## 2016-02-21 NOTE — Patient Instructions (Signed)
EGD/EGD. The risks and benefits such as perforation, bleeding, and infection were reviewed with the patient and is agreeable.

## 2016-02-23 ENCOUNTER — Telehealth (INDEPENDENT_AMBULATORY_CARE_PROVIDER_SITE_OTHER): Payer: Self-pay | Admitting: Internal Medicine

## 2016-02-23 DIAGNOSIS — K219 Gastro-esophageal reflux disease without esophagitis: Secondary | ICD-10-CM

## 2016-02-23 MED ORDER — PANTOPRAZOLE SODIUM 40 MG PO TBEC: 40.0000 mg | DELAYED_RELEASE_TABLET | Freq: Two times a day (BID) | ORAL | 3 refills | Status: DC

## 2016-02-23 NOTE — Telephone Encounter (Signed)
Patient called, stated that she called [facility] and they told her that since they have never filled this for her (she changed pharmacy's) that we needed to call the Protonix in.    [phone]

## 2016-02-23 NOTE — Telephone Encounter (Signed)
Rx for protonix ordered

## 2016-02-23 NOTE — Telephone Encounter (Signed)
done

## 2016-02-28 ENCOUNTER — Ambulatory Visit (INDEPENDENT_AMBULATORY_CARE_PROVIDER_SITE_OTHER): Payer: Medicare HMO | Admitting: *Deleted

## 2016-02-28 ENCOUNTER — Other Ambulatory Visit: Payer: Self-pay | Admitting: *Deleted

## 2016-02-28 ENCOUNTER — Ambulatory Visit: Payer: Medicare HMO

## 2016-02-28 DIAGNOSIS — Z7901 Long term (current) use of anticoagulants: Secondary | ICD-10-CM

## 2016-02-28 LAB — POCT INR: INR: 2.5

## 2016-02-28 NOTE — Telephone Encounter (Signed)
Procedure scheduled for Friday sept 15th. Need to know how to take coumadin and lovenox.   Also pt wants phenergan for nausea. [place] [facility].

## 2016-02-29 ENCOUNTER — Telehealth: Payer: Self-pay | Admitting: Family Medicine

## 2016-02-29 ENCOUNTER — Other Ambulatory Visit: Payer: Self-pay | Admitting: *Deleted

## 2016-02-29 MED ORDER — PROMETHAZINE HCL 12.5 MG PO TABS: 12.5000 mg | ORAL_TABLET | Freq: Four times a day (QID) | ORAL | 5 refills | Status: DC | PRN

## 2016-02-29 NOTE — Telephone Encounter (Signed)
Discussed with pt Stop coumadin five d before procedure. Start lovenox 90 mg sub q twelve hrs then, the day before procedure take the lovenox that morn only, get inr that day to confirm coming down. Resume lovenow the day after procedure. rx ten d worth of lovenox. lovenox called into pharm. Phenergan sent to pharm. Pt notified. Pt verbalized understanding. Pt has appt for inr on thurs at 1:30

## 2016-02-29 NOTE — Telephone Encounter (Signed)
Patient said we were going to call in Rx for her lovenox shots and phenergan yesterday, but pharmacy doesn't have anything.  Please advise.   [employer]

## 2016-02-29 NOTE — Progress Notes (Unsigned)
lov 90

## 2016-02-29 NOTE — Telephone Encounter (Signed)
Stop coumadin five d before procedure. Start lovenox 90 mg sub q twelve hrs then, the day before procedure take the lovenox that morn only, get inr that day to confirm coming down. Resume lovenow the day after procedure. rx ten d worth of lovenox  Refill phen times six

## 2016-03-04 NOTE — Progress Notes (Signed)
Patient already sch'd 03/08/16 from [place] with Karna Christmas

## 2016-03-07 ENCOUNTER — Ambulatory Visit (INDEPENDENT_AMBULATORY_CARE_PROVIDER_SITE_OTHER): Payer: Medicare HMO

## 2016-03-07 DIAGNOSIS — Z7901 Long term (current) use of anticoagulants: Secondary | ICD-10-CM

## 2016-03-07 LAB — POCT INR: INR: 1.1

## 2016-03-08 ENCOUNTER — Ambulatory Visit (HOSPITAL_COMMUNITY)
Admission: RE | Admit: 2016-03-08 | Discharge: 2016-03-08 | Disposition: A | Discharge status: Home / Self Care | Payer: Medicare HMO | Source: Ambulatory Visit | Attending: Internal Medicine | Admitting: Internal Medicine

## 2016-03-08 ENCOUNTER — Encounter (HOSPITAL_COMMUNITY)
Admission: RE | Disposition: A | Discharge status: Home / Self Care | Payer: Self-pay | Source: Ambulatory Visit | Attending: Internal Medicine

## 2016-03-08 ENCOUNTER — Encounter (HOSPITAL_COMMUNITY): Payer: Self-pay | Admitting: *Deleted

## 2016-03-08 DIAGNOSIS — R630 Anorexia: Secondary | ICD-10-CM | POA: Diagnosis not present

## 2016-03-08 DIAGNOSIS — R1310 Dysphagia, unspecified: Secondary | ICD-10-CM | POA: Insufficient documentation

## 2016-03-08 DIAGNOSIS — R1319 Other dysphagia: Secondary | ICD-10-CM

## 2016-03-08 DIAGNOSIS — E039 Hypothyroidism, unspecified: Secondary | ICD-10-CM | POA: Insufficient documentation

## 2016-03-08 DIAGNOSIS — G8929 Other chronic pain: Secondary | ICD-10-CM | POA: Diagnosis not present

## 2016-03-08 DIAGNOSIS — I2510 Atherosclerotic heart disease of native coronary artery without angina pectoris: Secondary | ICD-10-CM | POA: Diagnosis not present

## 2016-03-08 DIAGNOSIS — J45909 Unspecified asthma, uncomplicated: Secondary | ICD-10-CM | POA: Diagnosis not present

## 2016-03-08 DIAGNOSIS — Z87891 Personal history of nicotine dependence: Secondary | ICD-10-CM | POA: Diagnosis not present

## 2016-03-08 DIAGNOSIS — R110 Nausea: Secondary | ICD-10-CM | POA: Diagnosis not present

## 2016-03-08 DIAGNOSIS — Z86711 Personal history of pulmonary embolism: Secondary | ICD-10-CM | POA: Diagnosis not present

## 2016-03-08 DIAGNOSIS — K449 Diaphragmatic hernia without obstruction or gangrene: Secondary | ICD-10-CM | POA: Diagnosis not present

## 2016-03-08 DIAGNOSIS — Z6839 Body mass index (BMI) 39.0-39.9, adult: Secondary | ICD-10-CM | POA: Insufficient documentation

## 2016-03-08 DIAGNOSIS — Z79899 Other long term (current) drug therapy: Secondary | ICD-10-CM | POA: Diagnosis not present

## 2016-03-08 DIAGNOSIS — E785 Hyperlipidemia, unspecified: Secondary | ICD-10-CM | POA: Diagnosis not present

## 2016-03-08 DIAGNOSIS — I10 Essential (primary) hypertension: Secondary | ICD-10-CM | POA: Diagnosis not present

## 2016-03-08 DIAGNOSIS — Z7901 Long term (current) use of anticoagulants: Secondary | ICD-10-CM | POA: Diagnosis not present

## 2016-03-08 DIAGNOSIS — Z7951 Long term (current) use of inhaled steroids: Secondary | ICD-10-CM | POA: Insufficient documentation

## 2016-03-08 DIAGNOSIS — I480 Paroxysmal atrial fibrillation: Secondary | ICD-10-CM | POA: Diagnosis not present

## 2016-03-08 DIAGNOSIS — Z955 Presence of coronary angioplasty implant and graft: Secondary | ICD-10-CM | POA: Diagnosis not present

## 2016-03-08 DIAGNOSIS — R1013 Epigastric pain: Secondary | ICD-10-CM | POA: Insufficient documentation

## 2016-03-08 DIAGNOSIS — K219 Gastro-esophageal reflux disease without esophagitis: Secondary | ICD-10-CM | POA: Insufficient documentation

## 2016-03-08 DIAGNOSIS — I252 Old myocardial infarction: Secondary | ICD-10-CM | POA: Diagnosis not present

## 2016-03-08 DIAGNOSIS — R634 Abnormal weight loss: Secondary | ICD-10-CM | POA: Diagnosis not present

## 2016-03-08 DIAGNOSIS — R1314 Dysphagia, pharyngoesophageal phase: Secondary | ICD-10-CM | POA: Diagnosis not present

## 2016-03-08 DIAGNOSIS — E119 Type 2 diabetes mellitus without complications: Secondary | ICD-10-CM | POA: Insufficient documentation

## 2016-03-08 DIAGNOSIS — Z79891 Long term (current) use of opiate analgesic: Secondary | ICD-10-CM | POA: Diagnosis not present

## 2016-03-08 HISTORY — PX: ESOPHAGEAL DILATION: SHX303

## 2016-03-08 HISTORY — PX: ESOPHAGOGASTRODUODENOSCOPY: SHX5428

## 2016-03-08 SURGERY — EGD (ESOPHAGOGASTRODUODENOSCOPY)
Anesthesia: Moderate Sedation

## 2016-03-08 MED ORDER — BUTAMBEN-TETRACAINE-BENZOCAINE 2-2-14 % EX AERO
INHALATION_SPRAY | CUTANEOUS | Status: DC | PRN
  Administered 2016-03-08: 2 via TOPICAL

## 2016-03-08 MED ORDER — MIDAZOLAM HCL 5 MG/5ML IJ SOLN
INTRAMUSCULAR | Status: DC | PRN
  Administered 2016-03-08: 2 mg via INTRAVENOUS
  Administered 2016-03-08: 1 mg via INTRAVENOUS
  Administered 2016-03-08: 2 mg via INTRAVENOUS

## 2016-03-08 MED ORDER — STERILE WATER FOR IRRIGATION IR SOLN
Status: DC | PRN
  Administered 2016-03-08: 15:00:00

## 2016-03-08 MED ORDER — FENTANYL CITRATE (PF) 100 MCG/2ML IJ SOLN
INTRAMUSCULAR | Status: DC | PRN
  Administered 2016-03-08 (×4): 25 ug via INTRAVENOUS

## 2016-03-08 MED ORDER — MIDAZOLAM HCL 5 MG/5ML IJ SOLN
INTRAMUSCULAR | Status: AC
  Filled 2016-03-08: qty 10

## 2016-03-08 MED ORDER — FENTANYL CITRATE (PF) 100 MCG/2ML IJ SOLN
INTRAMUSCULAR | Status: AC
  Filled 2016-03-08: qty 2

## 2016-03-08 MED ORDER — SODIUM CHLORIDE 0.9 % IV SOLN
INTRAVENOUS | Status: DC
  Administered 2016-03-08: 1000 mL via INTRAVENOUS

## 2016-03-08 NOTE — H&P (Signed)
[name] is an 75 y.o. female.   Chief Complaint: Patient is here for EGD and ED. HPI: Patient is 75 year old Caucasian female with multiple medical problems with chronic GERD who presents with over a month history of nausea loss of appetite and weight loss as well as epigastric pain and she also complains of solid food dysphagia. She denies melena or rectal bleeding. She states it has been very difficult for to vomit but she did have an episode last week. She has lost few pounds of the last 1 month. She's not sure how much. Patient has history of A. fib and pulmonary embolism. She has been off warfarin for 5 days and is being bridged with Lovenox. Last dose was yesterday.  Past Medical History:  Diagnosis Date  .    .        . ASCVD (arteriosclerotic cardiovascular disease)    stent to mid and proximal left anterior descending in 06/2002;drug eluting stent placed in the second diagnol in 08/2003 after  A  non-st elevation myocardial infarction   . Asthma   . Atrial fibrillation ([place])   . Chronic anticoagulation   . Chronic pain of left knee   . Coronary artery disease   . Diabetes mellitus    no insulin         .    Marland Kitchen FH: colonic polyps    adenomataous  . GERD (gastroesophageal reflux disease)   . Hyperlipidemia    pulmonary embolism 2000 and 09/2008  . Hypertension   . Hypothyroidism   . Insomnia   . Irritable bowel syndrome   . Paroxysmal atrial fibrillation (H[facility])    normal LV function; episodes occurred in 205 and 09/2007  .    .    . Pulmonary embolism ([place])    2000/09/2008             .       . Venous stasis     Past Surgical History:  Procedure Laterality Date  . ABDOMINAL HYSTERECTOMY    . APPENDECTOMY    . BALLOON DILATION N/A 11/11/2013   Procedure: BALLOON DILATION;  Surgeon: Rogene Houston, MD;  [facility];  Service: Endoscopy;  Laterality: N/A;  . BREAST REDUCTION SURGERY    . BREAST SURGERY    . CHOLECYSTECTOMY    . CLEFT PALATE REPAIR      4 surgeries  . COLONOSCOPY  2011   negative  . COLONOSCOPY N/A 05/10/2014   Procedure: COLONOSCOPY;  Surgeon: Rogene Houston, MD;  [facility];  Service: Endoscopy;  Laterality: N/A;  . ESOPHAGOGASTRODUODENOSCOPY N/A 11/11/2013   Procedure: ESOPHAGOGASTRODUODENOSCOPY (EGD);  Surgeon: Rogene Houston, MD;  [facility];  Service: Endoscopy;  Laterality: N/A;  200-moved to 100 Ann notified pt  . ESOPHAGOGASTRODUODENOSCOPY N/A 05/09/2014   Procedure: ESOPHAGOGASTRODUODENOSCOPY (EGD);  Surgeon: Rogene Houston, MD;  [facility];  Service: Endoscopy;  Laterality: N/A;  . HEEL SPUR SURGERY    . KNEE SURGERY    . MALONEY DILATION N/A 11/11/2013   Procedure: Venia Minks DILATION;  Surgeon: Rogene Houston, MD;  [facility];  Service: Endoscopy;  Laterality: N/A;  . OOPHORECTOMY    . SAVORY DILATION N/A 11/11/2013   Procedure: SAVORY DILATION;  Surgeon: Rogene Houston, MD;  [facility];  Service: Endoscopy;  Laterality: N/A;    Family History  Problem Relation Age of Onset  . Heart attack Mother   .  Diabetes Mother   . Hyperlipidemia Mother   . Heart attack Father   . Lung cancer Sister   . Lymphoma Brother   . Colon cancer Neg Hx    Social History:  reports that she has quit smoking. Her smoking use included Cigarettes. She started smoking about 28 years ago. She has a 60.00 pack-year smoking history. She has never used smokeless tobacco. She reports that she does not drink alcohol or use drugs.  Allergies:  Allergies  Allergen Reactions  . Demerol Nausea And Vomiting    Medications Prior to Admission  Medication Sig Dispense Refill  . albuterol (PROAIR HFA) 108 (90 Base) MCG/ACT inhaler INHALE 2 PUFFS UP TO FOUR TIMES DAILY AS NEEDED FOR WHEEZING. 8.5 g 2  . atorvastatin (LIPITOR) 40 MG tablet Take 1 tablet (40 mg total) by mouth daily. 90 tablet 3  . Cholecalciferol (VITAMIN D3 PO) Take 1 tablet by mouth daily.    Marland Kitchen  diltiazem (CARDIZEM CD) 180 MG 24 hr capsule TAKE 1 CAPSULE BY MOUTH ONCE A DAY. 30 capsule 11  . diphenoxylate-atropine (LOMOTIL) 2.5-0.025 MG tablet TAKE 1 TABLET BY MOUTH TWICE DAILY AS NEEDED. 60 tablet 5  . enoxaparin (LOVENOX) 30 MG/0.3ML injection Inject 90 mg into the skin every 12 (twelve) hours.    . fluticasone-salmeterol (ADVAIR HFA) 230-21 MCG/ACT inhaler Inhale 2 puffs into the lungs 2 (two) times daily. 1 Inhaler 12  . levothyroxine (SYNTHROID, LEVOTHROID) 50 MCG tablet TAKE ONE TABLET BY MOUTH ONCE DAILY. 30 tablet 5  . ondansetron (ZOFRAN ODT) 4 MG disintegrating tablet Take 1 tablet (4 mg total) by mouth every 8 (eight) hours as needed for nausea. 10 tablet 0  . Oxycodone HCl 10 MG TABS Take one tablet up to qid prn pain (Patient taking differently: Take 10 mg by mouth 2 (two) times daily. Take one tablet up to qid prn pain) 120 tablet 0  . pantoprazole (PROTONIX) 40 MG tablet Take 1 tablet (40 mg total) by mouth 2 (two) times daily before a meal. 60 tablet 3  . promethazine (PHENERGAN) 12.5 MG tablet Take 1 tablet (12.5 mg total) by mouth every 6 (six) hours as needed for nausea or vomiting. 12 tablet 5  . valsartan (DIOVAN) 320 MG tablet TAKE (1) TABLET BY MOUTH ONCE DAILY. 30 tablet 11  . warfarin (COUMADIN) 5 MG tablet Take 2.5-5 mg by mouth daily. 5 mg daily except 2.5 mg on Monday.    Marland Kitchen acetaminophen (TYLENOL) 500 MG tablet Take 500 mg by mouth daily as needed for headache.    . albuterol (PROVENTIL) (2.5 MG/3ML) 0.083% nebulizer solution Take 3 mLs (2.5 mg total) by nebulization every 6 (six) hours as needed for wheezing or shortness of breath. 75 mL 5  . fluticasone (FLOVENT HFA) 110 MCG/ACT inhaler Inhale 2 puffs twice a day 1 Inhaler 6    Results for orders placed or performed in visit on 03/07/16 (from the past 48 hour(s))  POCT INR     Status: None   Collection Time: 03/07/16  1:38 PM  Result Value Ref Range   INR 1.1    No results found.  ROS  Blood pressure  (!) 176/78, pulse 67, temperature 98.9 F (37.2 C), temperature source Oral, resp. rate 10, height 5\' 1"  (1.549 m), weight 210 lb (95.3 kg), SpO2 98 %. Physical Exam  Constitutional: She appears well-developed and well-nourished.  HENT:  Mouth/Throat: Oropharynx is clear and moist.  Deformity to hard palate secondary to prior surgery.  Eyes: Conjunctivae are normal. No scleral icterus.  Neck: No thyromegaly present.  Cardiovascular: Normal rate, regular rhythm and normal heart sounds.   No murmur heard. Respiratory: Effort normal and breath sounds normal.  GI:  Abdomen is full but soft with mild midepigastric tenderness. No organomegaly or masses.  Lymphadenopathy:    She has no cervical adenopathy.     Assessment/Plan Epigastric pain nausea and anorexia with weight loss. Dysphagia. EGD and ED.  Hildred Laser, MD 03/08/2016, 2:38 PM

## 2016-03-08 NOTE — Op Note (Signed)
[facility] Patient Name: [name] Procedure Date: 03/08/2016 2:32 PM MRN: [id number] Date of Birth: [birth year] Attending MD: Hildred Laser , MD CSN: [id number] Age: 75 Admit Type: Outpatient Procedure:                Upper GI endoscopy Indications:              Epigastric abdominal pain, Esophageal dysphagia,                            Nausea Providers:                Hildred Laser, MD, Otis Peak B. Sharon Seller, RN, Sherlyn Lees, Technician Referring MD:             Grace Bushy. Wolfgang Phoenix, MD Medicines:                Fentanyl 100 micrograms IV, Midazolam 5 mg IV,                            Cetacaine spray Complications:            No immediate complications. Estimated Blood Loss:     Estimated blood loss: none. Procedure:                Pre-Anesthesia Assessment:                           - Prior to the procedure, a History and Physical                            was performed, and patient medications and                            allergies were reviewed. The patient's tolerance of                            previous anesthesia was also reviewed. The risks                            and benefits of the procedure and the sedation                            options and risks were discussed with the patient.                            All questions were answered, and informed consent                            was obtained. Prior Anticoagulants: The patient                            last took Coumadin (warfarin) 5 days and Lovenox                            (  enoxaparin) 1 day prior to the procedure. ASA                            Grade Assessment: III - A patient with severe                            systemic disease. After reviewing the risks and                            benefits, the patient was deemed in satisfactory                            condition to undergo the procedure.                           After obtaining informed consent, the endoscope  was                            passed under direct vision. Throughout the                            procedure, the patient's blood pressure, pulse, and                            oxygen saturations were monitored continuously. The                            EG-[id number] ([id number]) scope was introduced through the                            mouth, and advanced to the second part of duodenum.                            The upper GI endoscopy was accomplished without                            difficulty. The patient tolerated the procedure                            well. Scope In: 2:53:12 PM Scope Out: 3:03:44 PM Total Procedure Duration: 0 hours 10 minutes 32 seconds  Findings:      The examined esophagus was normal.      The Z-line was regular and was found 36 cm from the incisors.      A 2 cm hiatal hernia was present.      No endoscopic abnormality was evident in the esophagus to explain the       patient's complaint of dysphagia. It was decided, however, to proceed       with dilation of the entire esophagus. The scope was withdrawn. Dilation       was performed with a Maloney dilator with mild resistance at 51 Fr. The       dilation site was examined following endoscope reinsertion and showed no       bleeding, mucosal tear or perforation.  The entire examined stomach was normal.      The duodenal bulb and second portion of the duodenum were normal. Impression:               - Normal esophagus.                           - Z-line regular, 36 cm from the incisors.                           - 2 cm hiatal hernia.                           - No endoscopic esophageal abnormality to explain                            patient's dysphagia. Esophagus dilated. Dilated.                           - Normal stomach.                           - Normal duodenal bulb and second portion of the                            duodenum.                           - No specimens collected.                            - Comment: Nausea may be secondary t or                            gastroparesis. If she remains with nauseashe may                            consider dropping narcotic dose.                           Since no structural abnormality noted to esophagus                            she possibly ha esophageal motility disorder. Moderate Sedation:      Moderate (conscious) sedation was administered by the endoscopy nurse       and supervised by the endoscopist. The following parameters were       monitored: oxygen saturation, heart rate, blood pressure, CO2       capnography and response to care. Total physician intraservice time was       16 minutes. Recommendation:           - Patient has a contact number available for                            emergencies. The signs and symptoms of potential  delayed complications were discussed with the                            patient. Return to normal activities tomorrow.                            Written discharge instructions were provided to the                            patient.                           - Resume previous diet today.                           - Continue present medications.                           - Resume Coumadin (warfarin) today and Lovenox                            (enoxaparin) today at prior doses. Refer to                            [facility] for further adjustment of therapy.                           - Telephone GI office in 5 weeks. Procedure Code(s):        --- Professional ---                           [phone], Esophagogastroduodenoscopy, flexible,                            transoral; diagnostic, including collection of                            specimen(s) by brushing or washing, when performed                            (separate procedure)                           43450, Dilation of esophagus, by unguided sound or                            bougie, single or multiple  passes                           99152, Moderate sedation services provided by the                            same physician or other qualified health care                            professional performing the diagnostic or  therapeutic service that the sedation supports,                            requiring the presence of an independent trained                            observer to assist in the monitoring of the                            patient's level of consciousness and physiological                            status; initial 15 minutes of intraservice time,                            patient age 73 years or older Diagnosis Code(s):        --- Professional ---                           K44.9, Diaphragmatic hernia without obstruction or                            gangrene                           R10.13, Epigastric pain                           R13.14, Dysphagia, pharyngoesophageal phase                           R11.0, Nausea CPT copyright 2016 [employer]. All rights reserved. The codes documented in this report are preliminary and upon coder review may  be revised to meet current compliance requirements. Hildred Laser, MD Hildred Laser, MD 03/08/2016 3:17:03 PM This report has been signed electronically. Number of Addenda: 0

## 2016-03-08 NOTE — Discharge Instructions (Signed)
Resume usual medications including warfarin at usual dose. Resume Lovenox starting this evening and continue through 03/12/2016 or as recommended by Dr. Wolfgang Phoenix. Take ondansetron 4 mg by mouth every morning and take subsequent doses on as-needed basis. No driving for 24 hours. Please call office with progress report in one week.  Gastrointestinal Endoscopy, Care After Refer to this sheet in the next few weeks. These instructions provide you with information on caring for yourself after your procedure. Your caregiver may also give you more specific instructions. Your treatment has been planned according to current medical practices, but problems sometimes occur. Call your caregiver if you have any problems or questions after your procedure. HOME CARE INSTRUCTIONS  If you were given medicine to help you relax (sedative), do not drive, operate machinery, or sign important documents for 24 hours.  Avoid alcohol and hot or warm beverages for the first 24 hours after the procedure.  Only take over-the-counter or prescription medicines for pain, discomfort, or fever as directed by your caregiver. You may resume taking your normal medicines unless your caregiver tells you otherwise. Ask your caregiver when you may resume taking medicines that may cause bleeding, such as aspirin, clopidogrel, or warfarin.  You may return to your normal diet and activities on the day after your procedure, or as directed by your caregiver. Walking may help to reduce any bloated feeling in your abdomen.  Drink enough fluids to keep your urine clear or pale yellow.  You may gargle with salt water if you have a sore throat. SEEK IMMEDIATE MEDICAL CARE IF:  You have severe nausea or vomiting.  You have severe abdominal pain, abdominal cramps that last longer than 6 hours, or abdominal swelling (distention).  You have severe shoulder or back pain.  You have trouble swallowing.  You have shortness of breath, your  breathing is shallow, or you are breathing faster than normal.  You have a fever or a rapid heartbeat.  You vomit blood or material that looks like coffee grounds.  You have bloody, black, or tarry stools. MAKE SURE YOU:  Understand these instructions.  Will watch your condition.  Will get help right away if you are not doing well or get worse.   This information is not intended to replace advice given to you by your health care provider. Make sure you discuss any questions you have with your health care provider.

## 2016-03-11 ENCOUNTER — Telehealth: Payer: Self-pay | Admitting: Family Medicine

## 2016-03-11 NOTE — Telephone Encounter (Signed)
She just saw Dr Laural Golden for scoping etc.has t I f u with them in this regard

## 2016-03-11 NOTE — Telephone Encounter (Signed)
Spoke with patient and informed her per Dr.Steve [place] that you follow up with Dr.Rehman for Gastro symptoms due to having a just had scope done. Patient verbalized understanding.

## 2016-03-11 NOTE — Telephone Encounter (Signed)
Patient wants to know if Dr. Richardson Landry can work her in his schedule this week in the afternoon.  She is still throwing up and very nauseous, as they didn't find anything wrong with her during her procedure.  I offered her an appointment with Hoyle Sauer 03/15/16, but she wants to see Dr. Richardson Landry.

## 2016-03-12 ENCOUNTER — Ambulatory Visit (INDEPENDENT_AMBULATORY_CARE_PROVIDER_SITE_OTHER): Payer: Medicare HMO

## 2016-03-12 DIAGNOSIS — Z7901 Long term (current) use of anticoagulants: Secondary | ICD-10-CM

## 2016-03-12 DIAGNOSIS — J45998 Other asthma: Secondary | ICD-10-CM | POA: Diagnosis not present

## 2016-03-12 LAB — POCT INR: INR: 1.2

## 2016-03-12 NOTE — Patient Instructions (Addendum)
Take 1 tablet all days except on Tuesday and Wednesday  Take 1  1/2 tablets . Continue Lovenox for 3 additional days. Stop Lovenox on Friday.

## 2016-03-13 ENCOUNTER — Ambulatory Visit (INDEPENDENT_AMBULATORY_CARE_PROVIDER_SITE_OTHER): Payer: Medicare HMO | Admitting: Internal Medicine

## 2016-03-13 ENCOUNTER — Telehealth: Payer: Self-pay | Admitting: Family Medicine

## 2016-03-13 NOTE — Telephone Encounter (Signed)
Patient is referring to Lovenox injections.

## 2016-03-13 NOTE — Telephone Encounter (Signed)
[name] I think you and eye spoke on this on stick with two per d until she stops

## 2016-03-13 NOTE — Telephone Encounter (Signed)
Patient wanted to know if she should continue 2 shots or one was seen 9/19 for got to ask.

## 2016-03-13 NOTE — Telephone Encounter (Signed)
Spoke with patient and informed her per Dr.Steve Luking- to continue taking the two lovenox injections until Friday and then discontinue injections and just take coumadin. Patient verbalized understanding.

## 2016-03-18 ENCOUNTER — Encounter (HOSPITAL_COMMUNITY): Payer: Self-pay | Admitting: Internal Medicine

## 2016-03-18 ENCOUNTER — Ambulatory Visit: Payer: Medicare HMO

## 2016-03-20 ENCOUNTER — Ambulatory Visit (INDEPENDENT_AMBULATORY_CARE_PROVIDER_SITE_OTHER): Payer: Medicare HMO | Admitting: *Deleted

## 2016-03-20 DIAGNOSIS — Z7901 Long term (current) use of anticoagulants: Secondary | ICD-10-CM

## 2016-03-20 LAB — POCT INR: INR: 1.5

## 2016-03-27 ENCOUNTER — Ambulatory Visit (INDEPENDENT_AMBULATORY_CARE_PROVIDER_SITE_OTHER): Payer: Medicare HMO | Admitting: *Deleted

## 2016-03-27 DIAGNOSIS — Z23 Encounter for immunization: Secondary | ICD-10-CM

## 2016-03-27 DIAGNOSIS — Z7901 Long term (current) use of anticoagulants: Secondary | ICD-10-CM

## 2016-03-27 LAB — POCT INR: INR: 2

## 2016-03-27 NOTE — Patient Instructions (Signed)
Take coumadin 5mg  tablets. Take one tablet every day except on wednesdays take one and a half tablets. Recheck INR in 4 weeks.

## 2016-04-02 ENCOUNTER — Other Ambulatory Visit: Payer: Self-pay | Admitting: Family Medicine

## 2016-04-04 ENCOUNTER — Ambulatory Visit: Payer: Medicare HMO | Admitting: Family Medicine

## 2016-04-05 ENCOUNTER — Encounter: Payer: Self-pay | Admitting: Family Medicine

## 2016-04-05 ENCOUNTER — Ambulatory Visit (INDEPENDENT_AMBULATORY_CARE_PROVIDER_SITE_OTHER): Payer: Medicare HMO | Admitting: Family Medicine

## 2016-04-05 VITALS — BP 124/76 | Ht 61.0 in | Wt 208.5 lb

## 2016-04-05 DIAGNOSIS — E119 Type 2 diabetes mellitus without complications: Secondary | ICD-10-CM | POA: Diagnosis not present

## 2016-04-05 DIAGNOSIS — R69 Illness, unspecified: Secondary | ICD-10-CM | POA: Diagnosis not present

## 2016-04-05 DIAGNOSIS — F331 Major depressive disorder, recurrent, moderate: Secondary | ICD-10-CM

## 2016-04-05 DIAGNOSIS — E785 Hyperlipidemia, unspecified: Secondary | ICD-10-CM | POA: Diagnosis not present

## 2016-04-05 DIAGNOSIS — R5383 Other fatigue: Secondary | ICD-10-CM

## 2016-04-05 DIAGNOSIS — I10 Essential (primary) hypertension: Secondary | ICD-10-CM

## 2016-04-05 DIAGNOSIS — E039 Hypothyroidism, unspecified: Secondary | ICD-10-CM | POA: Diagnosis not present

## 2016-04-05 DIAGNOSIS — K219 Gastro-esophageal reflux disease without esophagitis: Secondary | ICD-10-CM | POA: Diagnosis not present

## 2016-04-05 LAB — POCT GLYCOSYLATED HEMOGLOBIN (HGB A1C): Hemoglobin A1C: 5.6

## 2016-04-05 MED ORDER — OXYCODONE HCL 10 MG PO TABS: ORAL_TABLET | ORAL | 0 refills | Status: DC

## 2016-04-05 MED ORDER — BLOOD GLUCOSE MONITORING SUPPL W/DEVICE KIT: PACK | 5 refills | Status: DC

## 2016-04-05 NOTE — Progress Notes (Signed)
   Subjective:    Patient ID: [name], female    DOB: 1941/05/28, 75 y.o.   MRN: [id number]  Diabetes  She presents for her follow-up diabetic visit. She has type 2 diabetes mellitus. No MedicAlert identification noted. Eye exam is not current.  Patient claims compliance with diabetes medication. No obvious side effects. Reports no substantial low sugar spells. Most numbers are generally in good range when checked fasting. Generally does not miss a dose of medication. Watching diabetic diet closely   Patient has concerns of Flovent inhaler.insurance agent stated tha "ventolin will br better"Patient's insurance agent told her that she could switch to Ventolin and saline a lot of money. Currently she is on steroid inhaler with history of chronic persistent asthma. The steroid inhaler has helped her tremendously  Needs to get out anfd walking mre, and needs a sitter.pt needs an aid a few hours per week, does not get out of the house and leads to depression, crying spells a lot, and grouble sleeping  wants to keep iher in a out of nursing home as long as possible    Results for orders placed or performed in visit on 04/05/16  POCT HgB A1C  Result Value Ref Range   Hemoglobin A1C 5.6     Long time   86 one day, 92 anotherno low spells  Also would like to discuss Home Health   Results for orders placed or performed in visit on 04/05/16  POCT HgB A1C  Result Value Ref Range   Hemoglobin A1C 5.6      Review of Systems No headache, no major weight loss or weight gain, no chest pain no back pain abdominal pain no change in bowel habits complete ROS otherwise negative     Objective:   Physical Exam  Alert vitals stable, NAD. Blood pressure good on repeat. HEENT normal. Lungs clear. Heart regular rate and rhythm. Substantial obesity present. No wheezes today thankfully. C diabetic foot exam      Assessment & Plan:  Impression #1 type 2 diabetes control good discussed #2 chronic  pain with ongoing need for medications discussed meds refilled #3 hypothyroidism. Exact status uncertain discuss #4 asthma. Concerning that the patient's insurance agent would advise her she could stop her steroid inhaler. This is helped her tremendously #5 hyperlipidemia status uncertain #6 reflux ongoing #social situation. Patient lives alone in her apartment. Comes in today accompanied by her landlord/friend. They're wondering if she would qualify for a sitter through "home health" long discussion held at home health only provides skilled nursing support. We will investigate resources and get back with patient. #8 depression ongoing though stable on meds plan 40 minutes spent most in discussion. Medications refilled. Appropriate blood work. Diet exercise discussed. Need for steroid inhaler. Discussed

## 2016-04-05 NOTE — Progress Notes (Signed)
   Subjective:    Patient ID: [name], female    DOB: 1940/11/18, 75 y.o.   MRN: [id number]  HPI    Review of Systems     Objective:   Physical Exam        Assessment & Plan:

## 2016-04-07 DIAGNOSIS — F329 Major depressive disorder, single episode, unspecified: Secondary | ICD-10-CM | POA: Insufficient documentation

## 2016-04-07 DIAGNOSIS — F32A Depression, unspecified: Secondary | ICD-10-CM | POA: Insufficient documentation

## 2016-04-08 ENCOUNTER — Encounter (HOSPITAL_COMMUNITY): Payer: Self-pay

## 2016-04-08 ENCOUNTER — Emergency Department (HOSPITAL_COMMUNITY): Payer: Medicare HMO

## 2016-04-08 ENCOUNTER — Observation Stay (HOSPITAL_COMMUNITY)
Admission: EM | Admit: 2016-04-08 | Discharge: 2016-04-09 | Disposition: A | Discharge status: Skilled Nursing Facility | Payer: Medicare HMO | Attending: Internal Medicine | Admitting: Internal Medicine

## 2016-04-08 DIAGNOSIS — N179 Acute kidney failure, unspecified: Secondary | ICD-10-CM | POA: Diagnosis not present

## 2016-04-08 DIAGNOSIS — M25562 Pain in left knee: Secondary | ICD-10-CM | POA: Diagnosis not present

## 2016-04-08 DIAGNOSIS — I482 Chronic atrial fibrillation, unspecified: Secondary | ICD-10-CM | POA: Diagnosis present

## 2016-04-08 DIAGNOSIS — Z7901 Long term (current) use of anticoagulants: Secondary | ICD-10-CM | POA: Diagnosis not present

## 2016-04-08 DIAGNOSIS — E785 Hyperlipidemia, unspecified: Secondary | ICD-10-CM | POA: Diagnosis not present

## 2016-04-08 DIAGNOSIS — I4891 Unspecified atrial fibrillation: Secondary | ICD-10-CM | POA: Diagnosis not present

## 2016-04-08 DIAGNOSIS — E869 Volume depletion, unspecified: Secondary | ICD-10-CM | POA: Diagnosis present

## 2016-04-08 DIAGNOSIS — M1712 Unilateral primary osteoarthritis, left knee: Secondary | ICD-10-CM | POA: Diagnosis not present

## 2016-04-08 DIAGNOSIS — I10 Essential (primary) hypertension: Secondary | ICD-10-CM | POA: Insufficient documentation

## 2016-04-08 DIAGNOSIS — G8929 Other chronic pain: Secondary | ICD-10-CM | POA: Diagnosis not present

## 2016-04-08 DIAGNOSIS — R001 Bradycardia, unspecified: Secondary | ICD-10-CM | POA: Diagnosis not present

## 2016-04-08 DIAGNOSIS — Z87891 Personal history of nicotine dependence: Secondary | ICD-10-CM | POA: Diagnosis not present

## 2016-04-08 DIAGNOSIS — K219 Gastro-esophageal reflux disease without esophagitis: Secondary | ICD-10-CM | POA: Diagnosis present

## 2016-04-08 DIAGNOSIS — Z86711 Personal history of pulmonary embolism: Secondary | ICD-10-CM | POA: Insufficient documentation

## 2016-04-08 DIAGNOSIS — I2510 Atherosclerotic heart disease of native coronary artery without angina pectoris: Secondary | ICD-10-CM | POA: Insufficient documentation

## 2016-04-08 DIAGNOSIS — I480 Paroxysmal atrial fibrillation: Secondary | ICD-10-CM | POA: Diagnosis not present

## 2016-04-08 DIAGNOSIS — M6281 Muscle weakness (generalized): Secondary | ICD-10-CM

## 2016-04-08 DIAGNOSIS — I252 Old myocardial infarction: Secondary | ICD-10-CM | POA: Insufficient documentation

## 2016-04-08 DIAGNOSIS — Z79899 Other long term (current) drug therapy: Secondary | ICD-10-CM | POA: Insufficient documentation

## 2016-04-08 DIAGNOSIS — E875 Hyperkalemia: Secondary | ICD-10-CM | POA: Diagnosis present

## 2016-04-08 DIAGNOSIS — Z955 Presence of coronary angioplasty implant and graft: Secondary | ICD-10-CM | POA: Diagnosis not present

## 2016-04-08 DIAGNOSIS — E860 Dehydration: Secondary | ICD-10-CM

## 2016-04-08 DIAGNOSIS — E039 Hypothyroidism, unspecified: Secondary | ICD-10-CM | POA: Diagnosis not present

## 2016-04-08 DIAGNOSIS — E119 Type 2 diabetes mellitus without complications: Secondary | ICD-10-CM | POA: Diagnosis not present

## 2016-04-08 DIAGNOSIS — K5900 Constipation, unspecified: Secondary | ICD-10-CM

## 2016-04-08 LAB — CBC WITH DIFFERENTIAL/PLATELET
BASOS ABS: 0 10*3/uL (ref 0.0–0.1)
BASOS PCT: 0 %
EOS ABS: 0.3 10*3/uL (ref 0.0–0.7)
Eosinophils Relative: 3 %
HEMATOCRIT: 36.8 % (ref 36.0–46.0)
HEMOGLOBIN: 11.4 g/dL — AB (ref 12.0–15.0)
Lymphocytes Relative: 19 %
Lymphs Abs: 1.8 10*3/uL (ref 0.7–4.0)
MCH: 27.3 pg (ref 26.0–34.0)
MCHC: 31 g/dL (ref 30.0–36.0)
MCV: 88 fL (ref 78.0–100.0)
MONOS PCT: 7 %
Monocytes Absolute: 0.6 10*3/uL (ref 0.1–1.0)
NEUTROS ABS: 6.6 10*3/uL (ref 1.7–7.7)
NEUTROS PCT: 71 %
Platelets: 248 10*3/uL (ref 150–400)
RBC: 4.18 MIL/uL (ref 3.87–5.11)
RDW: 14.9 % (ref 11.5–15.5)
WBC: 9.2 10*3/uL (ref 4.0–10.5)

## 2016-04-08 LAB — COMPREHENSIVE METABOLIC PANEL
ALBUMIN: 3.3 g/dL — AB (ref 3.5–5.0)
ALK PHOS: 58 U/L (ref 38–126)
ALT: 11 U/L — AB (ref 14–54)
AST: 17 U/L (ref 15–41)
Anion gap: 6 (ref 5–15)
BILIRUBIN TOTAL: 0.4 mg/dL (ref 0.3–1.2)
BUN: 31 mg/dL — AB (ref 6–20)
CALCIUM: 9.3 mg/dL (ref 8.9–10.3)
CO2: 29 mmol/L (ref 22–32)
Chloride: 106 mmol/L (ref 101–111)
Creatinine, Ser: 1.48 mg/dL — ABNORMAL HIGH (ref 0.44–1.00)
GFR calc Af Amer: 38 mL/min — ABNORMAL LOW (ref >60)
GFR calc non Af Amer: 33 mL/min — ABNORMAL LOW (ref >60)
GLUCOSE: 91 mg/dL (ref 65–99)
Potassium: 5.4 mmol/L — ABNORMAL HIGH (ref 3.5–5.1)
SODIUM: 141 mmol/L (ref 135–145)
TOTAL PROTEIN: 6.5 g/dL (ref 6.5–8.1)

## 2016-04-08 LAB — URINALYSIS, ROUTINE W REFLEX MICROSCOPIC
Bilirubin Urine: NEGATIVE
GLUCOSE, UA: NEGATIVE mg/dL
HGB URINE DIPSTICK: NEGATIVE
KETONES UR: NEGATIVE mg/dL
Leukocytes, UA: NEGATIVE
Nitrite: NEGATIVE
PH: 5.5 (ref 5.0–8.0)
PROTEIN: NEGATIVE mg/dL
Specific Gravity, Urine: 1.015 (ref 1.005–1.030)

## 2016-04-08 LAB — PROTIME-INR
INR: 2.42
Prothrombin Time: 26.7 seconds — ABNORMAL HIGH (ref 11.4–15.2)

## 2016-04-08 LAB — BRAIN NATRIURETIC PEPTIDE: B Natriuretic Peptide: 216 pg/mL — ABNORMAL HIGH (ref 0.0–100.0)

## 2016-04-08 LAB — TROPONIN I: Troponin I: <0.03 ng/mL (ref <0.03)

## 2016-04-08 MED ORDER — INSULIN ASPART 100 UNIT/ML IV SOLN
5.0000 [IU] | Freq: Once | INTRAVENOUS | Status: AC
  Administered 2016-04-08: 5 [IU] via INTRAVENOUS

## 2016-04-08 MED ORDER — SODIUM CHLORIDE 0.9 % IV BOLUS (SEPSIS)
500.0000 mL | Freq: Once | INTRAVENOUS | Status: AC
  Administered 2016-04-08: 500 mL via INTRAVENOUS

## 2016-04-08 MED ORDER — DEXTROSE 50 % IV SOLN
INTRAVENOUS | Status: AC
  Filled 2016-04-08: qty 50

## 2016-04-08 MED ORDER — DEXTROSE 50 % IV SOLN
50.0000 mL | Freq: Once | INTRAVENOUS | Status: AC
  Administered 2016-04-08: 50 mL via INTRAVENOUS

## 2016-04-08 NOTE — ED Notes (Signed)
Pt reports hx of sleep apnea, O2 sat drops to 85% on RA, once awake pt rises up to 96%. Placed on 2L [place] while sleeping.

## 2016-04-08 NOTE — ED Triage Notes (Signed)
Pt brought in by EMS. Laid down for a nap at 1pm and woke up with left knee pain. Knee feels locked up. Pain goes away when extended. Brought in by EMS

## 2016-04-08 NOTE — ED Provider Notes (Signed)
[place] DEPT Provider Note   CSN: [id number] Arrival date & time: 04/08/16  1617     History   Chief Complaint Chief Complaint  Patient presents with  . Knee Pain    HPI [name] is a 75 y.o. female.  HPI Patient with history of chronic left knee pain due to osteoarthritis. She takes oxycodone for this. States that she took a nap this afternoon and woke and was able to flex her left knee. States the pain was worse than normal. Denies any recent trauma. No new swelling. No fever or chills. Patient has ongoing abdominal discomfort with nausea and episodic vomiting. States she has regular bowel movements. She has ongoing mild dyspnea with exertion. Denies any chest pain. Past Medical History:  Diagnosis Date  . Allergy   . Allergy history unknown   . Anticoagulated   . ASCVD (arteriosclerotic cardiovascular disease)    stent to mid and proximal left anterior descending in 06/2002;drug eluting stent placed in the second diagnol in 08/2003 after  A  non-st elevation myocardial infarction   . Asthma   . Atrial fibrillation ([place])   . Chronic anticoagulation   . Chronic pain of left knee   . Coronary artery disease   . Diabetes mellitus    no insulin  . Diarrhea    acute  . Diarrhea   . FH: colonic polyps    adenomataous  . GERD (gastroesophageal reflux disease)   . Hyperlipidemia    pulmonary embolism 2000 and 09/2008  . Hypertension   . Hypothyroidism   . Insomnia   . Irritable bowel syndrome   . Paroxysmal atrial fibrillation (HCC)    normal LV function; episodes occurred in 205 and 09/2007  . Pedal edema   . Peripheral edema   . Pulmonary embolism ([place])    2000/09/2008  . Rectal bleeding   . Thyroid disease    hypothyroidism  . Tobacco user    stopped   . Venous stasis     Patient Active Problem List   Diagnosis Date Noted  . Hyperkalemia 04/08/2016  . Depression 04/07/2016  . Nausea without vomiting 05/09/2014  . Melena 05/08/2014  . UGI bleed  05/08/2014  . Dysphagia, unspecified(787.20) 10/26/2013  . Difficulty in walking(719.7) 10/19/2012  . Weakness of left leg 10/19/2012  . Long term current use of anticoagulant therapy 09/26/2012  . Dysphagia 05/04/2012  . Acute renal failure ([place]) 08/10/2011  . Microcytic anemia 08/10/2011  . Acute bronchitis 08/09/2011  . Acute respiratory failure ([place]) 08/09/2011  . IBS (irritable bowel syndrome) 08/09/2011  . Elevated serum creatinine 08/09/2011  . Chest pressure 03/05/2011  . DOE (dyspnea on exertion) 03/05/2011  . Anemia 03/05/2011  . Peripheral neuropathy ([place]) 10/02/2010  . 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 Hypothyroidism 12/02/2008  . DM 12/02/2008  . Hyperlipidemia LDL goal <70 12/02/2008  . Essential hypertension 12/02/2008  . Coronary atherosclerosis 12/02/2008  . FIBRILLATION, ATRIAL 12/02/2008  . Asthma 12/02/2008  . GERD 12/02/2008  . TOBACCO USE, QUIT 12/02/2008    Past Surgical History:  Procedure Laterality Date  . ABDOMINAL HYSTERECTOMY    . APPENDECTOMY    . BALLOON DILATION N/A 11/11/2013   Procedure: BALLOON DILATION;  Surgeon: Rogene Houston, MD;  Location: [facility];  Service: Endoscopy;  Laterality: N/A;  . BREAST REDUCTION SURGERY    . BREAST SURGERY    . CHOLECYSTECTOMY    . CLEFT PALATE REPAIR  4 surgeries  . COLONOSCOPY  2011   negative  . COLONOSCOPY N/A 05/10/2014   Procedure: COLONOSCOPY;  Surgeon: Rogene Houston, MD;  Location: [facility];  Service: Endoscopy;  Laterality: N/A;  . ESOPHAGEAL DILATION N/A 03/08/2016   Procedure: ESOPHAGEAL DILATION;  Surgeon: Rogene Houston, MD;  Location: [facility];  Service: Endoscopy;  Laterality: N/A;  . ESOPHAGOGASTRODUODENOSCOPY N/A 11/11/2013   Procedure: ESOPHAGOGASTRODUODENOSCOPY (EGD);  Surgeon: Rogene Houston, MD;  Location: [facility];  Service: Endoscopy;  Laterality: N/A;  200-moved to 100 Ann notified pt  .  ESOPHAGOGASTRODUODENOSCOPY N/A 05/09/2014   Procedure: ESOPHAGOGASTRODUODENOSCOPY (EGD);  Surgeon: Rogene Houston, MD;  [facility];  Service: Endoscopy;  Laterality: N/A;  . ESOPHAGOGASTRODUODENOSCOPY N/A 03/08/2016   Procedure: ESOPHAGOGASTRODUODENOSCOPY (EGD);  Surgeon: Rogene Houston, MD;  [facility];  Service: Endoscopy;  Laterality: N/A;  2:20  . HEEL SPUR SURGERY    . KNEE SURGERY    . MALONEY DILATION N/A 11/11/2013   Procedure: Venia Minks DILATION;  Surgeon: Rogene Houston, MD;  [facility]: [facility];  Service: Endoscopy;  Laterality: N/A;  . OOPHORECTOMY    . SAVORY DILATION N/A 11/11/2013   Procedure: SAVORY DILATION;  Surgeon: Rogene Houston, MD;  [facility];  Service: Endoscopy;  Laterality: N/A;    OB History    No data available       Home Medications    Prior to Admission medications   Medication Sig Start Date End Date Taking? Authorizing Provider  acetaminophen (TYLENOL) 500 MG tablet Take 500 mg by mouth daily as needed for headache.   Yes Historical Provider, MD  albuterol (PROAIR HFA) 108 (90 Base) MCG/ACT inhaler INHALE 2 PUFFS UP TO FOUR TIMES DAILY AS NEEDED FOR WHEEZING. 07/14/15  Yes Kathyrn Drown, MD  albuterol (PROVENTIL) (2.5 MG/3ML) 0.083% nebulizer solution Take 3 mLs (2.5 mg total) by nebulization every 6 (six) hours as needed for wheezing or shortness of breath. 05/24/15  Yes Kathyrn Drown, MD  atorvastatin (LIPITOR) 40 MG tablet Take 1 tablet (40 mg total) by mouth daily. Patient taking differently: Take 40 mg by mouth at bedtime.  07/28/15  Yes Herminio Commons, MD  Cholecalciferol (VITAMIN D3 PO) Take 1 tablet by mouth every evening.    Yes Historical Provider, MD  diltiazem (CARDIZEM CD) 180 MG 24 hr capsule TAKE 1 CAPSULE BY MOUTH ONCE A DAY. 07/28/15  Yes Herminio Commons, MD  diphenoxylate-atropine (LOMOTIL) 2.5-0.025 MG tablet TAKE 1 TABLET BY MOUTH TWICE DAILY AS NEEDED. Patient taking differently: TAKE  1 TABLET BY MOUTH TWICE DAILY AS NEEDED FOR DIARRHEA 11/13/15  Yes Mikey Kirschner, MD  escitalopram (LEXAPRO) 20 MG tablet TAKE ONE TABLET BY MOUTH ONCE DAILY. 04/02/16  Yes Kathyrn Drown, MD  fluticasone-salmeterol (ADVAIR HFA) 230-21 MCG/ACT inhaler Inhale 2 puffs into the lungs 2 (two) times daily. 08/30/15  Yes Mikey Kirschner, MD  levothyroxine (SYNTHROID, LEVOTHROID) 50 MCG tablet TAKE ONE TABLET BY MOUTH ONCE DAILY. 01/29/16  Yes Mikey Kirschner, MD  ondansetron (ZOFRAN ODT) 4 MG disintegrating tablet Take 1 tablet (4 mg total) by mouth every 8 (eight) hours as needed for nausea. 01/21/16  Yes Noemi Chapel, MD  Oxycodone HCl 10 MG TABS Take one tablet up to qid prn pain Patient taking differently: Take 10 mg by mouth 4 (four) times daily as needed (FOR PAIN).  04/05/16  Yes Mikey Kirschner, MD  pantoprazole ([place])  40 MG tablet Take 1 tablet (40 mg total) by mouth 2 (two) times daily before a meal. 02/23/16  Yes Butch Penny, NP  promethazine (PHENERGAN) 12.5 MG tablet Take 1 tablet (12.5 mg total) by mouth every 6 (six) hours as needed for nausea or vomiting. 02/29/16  Yes Mikey Kirschner, MD  valsartan (DIOVAN) 320 MG tablet TAKE (1) TABLET BY MOUTH ONCE DAILY. 07/28/15  Yes Herminio Commons, MD  warfarin (COUMADIN) 5 MG tablet Take 5-7.5 mg by mouth daily. 5 mg daily except on  Wednesdays, take 7.36m   Yes Historical Provider, MD  Blood Glucose Monitoring Suppl w/Device KIT Please dispense Glucometer, Strips and all testing supplies per patient's preference or insurance.Test Glucose 1 time daily. Dx: E11.9 04/05/16   WMikey Kirschner MD    Family History Family History  Problem Relation Age of Onset  . Heart attack Mother   . Diabetes Mother   . Hyperlipidemia Mother   . Heart attack Father   . Lung cancer Sister   . Lymphoma Brother   . Colon cancer Neg Hx     Social History Social History  Substance Use Topics  . Smoking status: Former Smoker    Packs/day: 2.00     Years: 30.00    Types: Cigarettes    Start date: 05/05/1987  . Smokeless tobacco: Never Used  . Alcohol use No     Allergies   Demerol   Review of Systems Review of Systems  Constitutional: Positive for activity change, appetite change and fatigue. Negative for chills and fever.  Respiratory: Positive for shortness of breath. Negative for cough and chest tightness.   Cardiovascular: Negative for chest pain, palpitations and leg swelling.  Gastrointestinal: Positive for abdominal pain, nausea and vomiting. Negative for blood in stool, constipation and diarrhea.  Genitourinary: Negative for dysuria and flank pain.  Musculoskeletal: Positive for arthralgias. Negative for back pain, joint swelling and myalgias.  Skin: Negative for rash and wound.  Neurological: Negative for dizziness, weakness, light-headedness, numbness and headaches.  All other systems reviewed and are negative.    Physical Exam Updated Vital Signs BP 126/56   Pulse 65   Resp 19   SpO2 96%   Physical Exam  Constitutional: She is oriented to person, place, and time. She appears well-developed and well-nourished.  HENT:  Head: Normocephalic and atraumatic.  Mouth/Throat: Oropharynx is clear and moist.  Eyes: EOM are normal. Pupils are equal, round, and reactive to light.  Neck: Normal range of motion. Neck supple. No JVD present.  Cardiovascular: Normal rate and regular rhythm.  Exam reveals no gallop and no friction rub.   No murmur heard. Pulmonary/Chest: Effort normal and breath sounds normal. No respiratory distress. She has no wheezes. She has no rales. She exhibits no tenderness.  Abdominal: Soft. Bowel sounds are normal. There is no tenderness. There is no rebound and no guarding.  Musculoskeletal: She exhibits tenderness. She exhibits no edema or deformity.  Patient with medial left knee tenderness to palpation. No obvious effusion. Unable to flex knee due to pain. No erythema or warmth. 2+ distal  pulses. No calf swelling, asymmetry or tenderness.  Neurological: She is alert and oriented to person, place, and time.  Moves all extremities without deficit though difficult to assess left lower extremity due to pain with range of motion of the left knee. Sensation is intact.  Skin: Skin is warm and dry. Capillary refill takes less than 2 seconds. No rash noted. No erythema.  Psychiatric: She has a normal mood and affect. Her behavior is normal.  Nursing note and vitals reviewed.    ED Treatments / Results  Labs (all labs ordered are listed, but only abnormal results are displayed) Labs Reviewed  CBC WITH DIFFERENTIAL/PLATELET - Abnormal; Notable for the following:       Result Value   Hemoglobin 11.4 (*)    All other components within normal limits  COMPREHENSIVE METABOLIC PANEL - Abnormal; Notable for the following:    Potassium 5.4 (*)    BUN 31 (*)    Creatinine, Ser 1.48 (*)    Albumin 3.3 (*)    ALT 11 (*)    GFR calc non Af Amer 33 (*)    GFR calc Af Amer 38 (*)    All other components within normal limits  PROTIME-INR - Abnormal; Notable for the following:    Prothrombin Time 26.7 (*)    All other components within normal limits  BRAIN NATRIURETIC PEPTIDE - Abnormal; Notable for the following:    B Natriuretic Peptide 216.0 (*)    All other components within normal limits  URINALYSIS, ROUTINE W REFLEX MICROSCOPIC (NOT AT [facility])  TROPONIN I    EKG  EKG Interpretation  Date/Time:  Monday April 08 2016 20:01:12 EDT Ventricular Rate:  55 PR Interval:    QRS Duration: 95 QT Interval:  448 QTC Calculation: 429 R Axis:   40 Text Interpretation:  Sinus rhythm Low voltage, precordial leads Confirmed by Lita Mains  MD, Tyrik Stetzer ([id number]) on 04/08/2016 9:07:40 PM       Radiology Dg Chest 2 View  Result Date: 04/08/2016 CLINICAL DATA:  Chronic left knee pain EXAM: CHEST  2 VIEW COMPARISON:  08/17/2015 FINDINGS: Cardiac shadow is mildly enlarged. Aortic calcifications  are again seen. The lungs are well aerated bilaterally. No focal infiltrate or sizable effusion is seen. Stable compression deformities noted in the lower thoracic spine. No new bony abnormality is seen. IMPRESSION: No active cardiopulmonary disease. Electronically Signed   By: Inez Catalina M.D.   On: 04/08/2016 17:36   Dg Knee Complete 4 Views Left  Result Date: 04/08/2016 CLINICAL DATA:  Chronic left knee pain EXAM: LEFT KNEE - COMPLETE 4+ VIEW COMPARISON:  Left knee radiograph 12/16/2012 FINDINGS: Compare to the prior examination, there is a new ossific fragment adjacent to the right medial femoral condyles. There is been worsening of femorotibial joint space loss with mild progression of osteophyte formation. No knee effusion. Patellofemoral space is preserved. No acute fracture or dislocation. IMPRESSION: 1. Worsening of moderate femorotibial osteoarthrosis compared to 12/16/2012. 2. No acute fracture or dislocation. Electronically Signed   By: Ulyses Jarred M.D.   On: 04/08/2016 17:40    Procedures Procedures (including critical care time)  Medications Ordered in ED Medications  sodium chloride 0.9 % bolus 500 mL (0 mLs Intravenous Stopped 04/08/16 2110)  insulin aspart (novoLOG) injection 5 Units (5 Units Intravenous Given 04/08/16 2027)  dextrose 50 % solution 50 mL (50 mLs Intravenous Given 04/08/16 2027)     Initial Impression / Assessment and Plan / ED Course  I have reviewed the triage vital signs and the nursing notes.  Pertinent labs & imaging results that were available during my care of the patient were reviewed by me and considered in my medical decision making (see chart for details).  Clinical Course    She went episodic desaturations into the low 90s and high 80s while in the room. Doesn't appear to be any respiratory distress.  Final Clinical Impressions(s) / ED Diagnoses   Final diagnoses:  Dehydration  Hyperkalemia  Osteoarthritis of left knee, unspecified  osteoarthritis type   Patient appears to have increase in creatinine over baseline level of 0.8. Mild elevation in potassium. No EKG changes. Suspect some degree of dehydration. We'll give IV fluids, insulin and glucose. Discussed with Dr. Marnette Burgess. Will admit to telemetry observation bed. Patient's presenting complaint of left knee pain appears to be due to acute exacerbation of her chronic osteoarthritis of left knee. Do not suspect infective process. New Prescriptions New Prescriptions   No medications on file     Julianne Rice, MD 04/08/16 2142

## 2016-04-08 NOTE — H&P (Signed)
History and Physical    [name] RDE:[id number] DOB: 1940-09-30 DOA: 04/08/2016  PCP: Mickie Hillier, MD  Patient coming from: Home   Chief Complaint: Knee pain  HPI: [name] is a 75 y.o. female with a past medical history significant for ASCVD s/p 2 stent placements, atrial fibrillation on coumadin, chronic knee pain, CAD, DM, GERD, HLD, and hypothyroidism presents with complaints of left knee pain due to ostearthritis that onset this afternoon. She is on oxycodone. Patient has associated abdominal discomfort, nausea, and vomiting but it is not new.  Denies chest pain.  Evalaution in the ER included a Potassium of 5.4, creatinine 1.48, BNP 216, INR 2.42. 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 EKG shows sinus rhythm.  Hospitalist was asked to admit her as she has AKI, felt to be pre renal, along with borderline hyperkalemia.   Review of Systems: As per HPI otherwise 10 point review of systems negative.   Past Medical History:  Diagnosis Date  . Allergy   . Allergy history unknown   . Anticoagulated   . ASCVD (arteriosclerotic cardiovascular disease)    stent to mid and proximal left anterior descending in 06/2002;drug eluting stent placed in the second diagnol in 08/2003 after  A  non-st elevation myocardial infarction   . Asthma   . Atrial fibrillation ([place])   . Chronic anticoagulation   . Chronic pain of left knee   . Coronary artery disease   . Diabetes mellitus    no insulin  . Diarrhea    acute  . Diarrhea   . FH: colonic polyps    adenomataous  . GERD (gastroesophageal reflux disease)   . Hyperlipidemia    pulmonary embolism 2000 and 09/2008  . Hypertension   . Hypothyroidism   . Insomnia   . Irritable bowel syndrome   . Paroxysmal atrial fibrillation (HCC)    normal LV function; episodes occurred in 205 and 09/2007  . Pedal edema   . Peripheral edema   . Pulmonary embolism ([place])    2000/09/2008  . Rectal bleeding   . Thyroid  disease    hypothyroidism  . Tobacco user    stopped   . Venous stasis     Past Surgical History:  Procedure Laterality Date  . ABDOMINAL HYSTERECTOMY    . APPENDECTOMY    . BALLOON DILATION N/A 11/11/2013   Procedure: BALLOON DILATION;  Surgeon: Rogene Houston, MD;  [facility];  Service: Endoscopy;  Laterality: N/A;  . BREAST REDUCTION SURGERY    . BREAST SURGERY    . CHOLECYSTECTOMY    . CLEFT PALATE REPAIR     4 surgeries  . COLONOSCOPY  2011   negative  . COLONOSCOPY N/A 05/10/2014   Procedure: COLONOSCOPY;  Surgeon: Rogene Houston, MD;  [facility];  Service: Endoscopy;  Laterality: N/A;  . ESOPHAGEAL DILATION N/A 03/08/2016   Procedure: ESOPHAGEAL DILATION;  Surgeon: Rogene Houston, MD;  [facility];  Service: Endoscopy;  Laterality: N/A;  . ESOPHAGOGASTRODUODENOSCOPY N/A 11/11/2013   Procedure: ESOPHAGOGASTRODUODENOSCOPY (EGD);  Surgeon: Rogene Houston, MD;  [facility];  Service: Endoscopy;  Laterality: N/A;  200-moved to 100 Ann notified pt  . ESOPHAGOGASTRODUODENOSCOPY N/A 05/09/2014   Procedure: ESOPHAGOGASTRODUODENOSCOPY (EGD);  Surgeon: Rogene Houston, MD;  [facility];  Service: Endoscopy;  Laterality: N/A;  . ESOPHAGOGASTRODUODENOSCOPY N/A 03/08/2016   Procedure: ESOPHAGOGASTRODUODENOSCOPY (EGD);  Surgeon: Rogene Houston, MD;  [facility]  [facility];  Service: Endoscopy;  Laterality: N/A;  2:20  . HEEL SPUR SURGERY    . KNEE SURGERY    . MALONEY DILATION N/A 11/11/2013   Procedure: Venia Minks DILATION;  Surgeon: Rogene Houston, MD;  [facility];  Service: Endoscopy;  Laterality: N/A;  . OOPHORECTOMY    . SAVORY DILATION N/A 11/11/2013   Procedure: SAVORY DILATION;  Surgeon: Rogene Houston, MD;  [facility];  Service: Endoscopy;  Laterality: N/A;     reports that she has quit smoking. Her smoking use included Cigarettes. She started smoking about 28 years ago. She has a 60.00  pack-year smoking history. She has never used smokeless tobacco. She reports that she does not drink alcohol or use drugs.  Allergies  Allergen Reactions  . Demerol Nausea And Vomiting    Family History  Problem Relation Age of Onset  . Heart attack Mother   . Diabetes Mother   . Hyperlipidemia Mother   . Heart attack Father   . Lung cancer Sister   . Lymphoma Brother   . Colon cancer Neg Hx     Prior to Admission medications   Medication Sig Start Date End Date Taking? Authorizing Provider  acetaminophen (TYLENOL) 500 MG tablet Take 500 mg by mouth daily as needed for headache.   Yes Historical Provider, MD  albuterol (PROAIR HFA) 108 (90 Base) MCG/ACT inhaler INHALE 2 PUFFS UP TO FOUR TIMES DAILY AS NEEDED FOR WHEEZING. 07/14/15  Yes Kathyrn Drown, MD  albuterol (PROVENTIL) (2.5 MG/3ML) 0.083% nebulizer solution Take 3 mLs (2.5 mg total) by nebulization every 6 (six) hours as needed for wheezing or shortness of breath. 05/24/15  Yes Kathyrn Drown, MD  atorvastatin (LIPITOR) 40 MG tablet Take 1 tablet (40 mg total) by mouth daily. Patient taking differently: Take 40 mg by mouth at bedtime.  07/28/15  Yes Herminio Commons, MD  Cholecalciferol (VITAMIN D3 PO) Take 1 tablet by mouth every evening.    Yes Historical Provider, MD  diltiazem (CARDIZEM CD) 180 MG 24 hr capsule TAKE 1 CAPSULE BY MOUTH ONCE A DAY. 07/28/15  Yes Herminio Commons, MD  diphenoxylate-atropine (LOMOTIL) 2.5-0.025 MG tablet TAKE 1 TABLET BY MOUTH TWICE DAILY AS NEEDED. Patient taking differently: TAKE 1 TABLET BY MOUTH TWICE DAILY AS NEEDED FOR DIARRHEA 11/13/15  Yes Mikey Kirschner, MD  escitalopram (LEXAPRO) 20 MG tablet TAKE ONE TABLET BY MOUTH ONCE DAILY. 04/02/16  Yes Kathyrn Drown, MD  fluticasone-salmeterol (ADVAIR HFA) 230-21 MCG/ACT inhaler Inhale 2 puffs into the lungs 2 (two) times daily. 08/30/15  Yes Mikey Kirschner, MD  levothyroxine (SYNTHROID, LEVOTHROID) 50 MCG tablet TAKE ONE TABLET BY MOUTH  ONCE DAILY. 01/29/16  Yes Mikey Kirschner, MD  ondansetron (ZOFRAN ODT) 4 MG disintegrating tablet Take 1 tablet (4 mg total) by mouth every 8 (eight) hours as needed for nausea. 01/21/16  Yes Noemi Chapel, MD  Oxycodone HCl 10 MG TABS Take one tablet up to qid prn pain Patient taking differently: Take 10 mg by mouth 4 (four) times daily as needed (FOR PAIN).  04/05/16  Yes Mikey Kirschner, MD  pantoprazole (PROTONIX) 40 MG tablet Take 1 tablet (40 mg total) by mouth 2 (two) times daily before a meal. 02/23/16  Yes Butch Penny, NP  promethazine (PHENERGAN) 12.5 MG tablet Take 1 tablet (12.5 mg total) by mouth every 6 (six) hours as needed for nausea or vomiting. 02/29/16  Yes Grace Bushy  Luk[place], MD  valsartan (DIOVAN) 320 MG tablet TAKE (1) TABLET BY MOUTH ONCE DAILY. 07/28/15  Yes Herminio Commons, MD  warfarin (COUMADIN) 5 MG tablet Take 5-7.5 mg by mouth daily. 5 mg daily except on  Wednesdays, take 7.90m   Yes Historical Provider, MD  Blood Glucose Monitoring Suppl w/Device KIT Please dispense Glucometer, Strips and all testing supplies per patient's preference or insurance.Test Glucose 1 time daily. Dx: E11.9 04/05/16   WMikey Kirschner MD    Physical Exam: Vitals:   04/08/16 1900 04/08/16 1930 04/08/16 2130 04/08/16 2200  BP: 127/66 126/56 108/56 115/76  Pulse: (!) 49 65 (!) 47 (!) 47  Resp: _0 SpO2: 92% 96% 91% 91%      Constitutional: NAD, calm, comfortable Vitals:   04/08/16 1900 04/08/16 1930 04/08/16 2130 04/08/16 2200  BP: 127/66 126/56 108/56 115/76  Pulse: (!) 49 65 (!) 47 (!) 47  Resp: _1 SpO2: 92% 96% 91% 91%   Eyes: PERRL, lids and conjunctivae normal ENMT: Mucous membranes are moist. Posterior pharynx clear of any exudate or lesions.Normal dentition.  Neck: normal, supple, no masses, no thyromegaly Respiratory: clear to auscultation bilaterally, no wheezing, no crackles. Normal respiratory effort. No accessory muscle use.  Cardiovascular: Regular  rate and rhythm, no murmurs / rubs / gallops. No extremity edema. 2+ pedal pulses. No carotid bruits.  Abdomen: no tenderness, no masses palpated. No hepatosplenomegaly. Bowel sounds positive.  Musculoskeletal: no clubbing / cyanosis. No joint deformity upper and lower extremities. Good ROM, no contractures. Normal muscle tone.  Skin: no rashes, lesions, ulcers. No induration Neurologic: CN 2-12 grossly intact. Sensation intact, DTR normal. Strength 5/5 in all 4.  Psychiatric: Normal judgment and insight. Alert and oriented x 3. Normal mood.   Labs on Admission: I have personally reviewed following labs and imaging studies  CBC:  Recent Labs Lab 04/08/16 1734  WBC 9.2  NEUTROABS 6.6  HGB 11.4*  HCT 36.8  MCV 88.0  PLT 2209  Basic Metabolic Panel:  Recent Labs Lab 04/08/16 1734  NA 141  K 5.4*  CL 106  CO2 29  GLUCOSE 91  BUN 31*  CREATININE 1.48*  CALCIUM 9.3   Liver Function Tests:  Recent Labs Lab 04/08/16 1734  AST 17  ALT 11*  ALKPHOS 58  BILITOT 0.4  PROT 6.5  ALBUMIN 3.3*   Coagulation Profile:  Recent Labs Lab 04/08/16 1734  INR 2.42   Cardiac Enzymes:  Recent Labs Lab 04/08/16 1734  TROPONINI <0.03   Urine analysis:    Component Value Date/Time   COLORURINE YELLOW 04/08/2016 1[place]10/16/2017 1638   LABSPEC 1.015 04/08/2016 1638   PHURINE 5.5 04/08/2016 1[place]10/16/2017 1[place]10/16/2017 1[place]10/16/2017 1[place]10/16/2017 1638   PROTEINUR NEGATIVE 04/08/2016 1638   UROBILINOGEN 0.2 05/08/2014 2240   NITRITE NEGATIVE 04/08/2016 1638   LEUKOCYTESUR NEGATIVE 04/08/2016 1638    Radiological Exams on Admission: Dg Chest 2 View  Result Date: 04/08/2016 CLINICAL DATA:  Chronic left knee pain EXAM: CHEST  2 VIEW COMPARISON:  08/17/2015 FINDINGS: Cardiac shadow is mildly enlarged. Aortic calcifications are again seen. The lungs are well aerated  bilaterally. No focal infiltrate or sizable effusion is seen. Stable compression deformities noted in the lower thoracic spine. No new bony abnormality is seen. IMPRESSION: No active cardiopulmonary disease. Electronically Signed   By:  Inez Catalina M.D.   On: 04/08/2016 17:36   Dg Knee Complete 4 Views Left  Result Date: 04/08/2016 CLINICAL DATA:  Chronic left knee pain EXAM: LEFT KNEE - COMPLETE 4+ VIEW COMPARISON:  Left knee radiograph 12/16/2012 FINDINGS: Compare to the prior examination, there is a new ossific fragment adjacent to the right medial femoral condyles. There is been worsening of femorotibial joint space loss with mild progression of osteophyte formation. No knee effusion. Patellofemoral space is preserved. No acute fracture or dislocation. IMPRESSION: 1. Worsening of moderate femorotibial osteoarthrosis compared to 12/16/2012. 2. No acute fracture or dislocation. Electronically Signed   By: Ulyses Jarred M.D.   On: 04/08/2016 17:40    EKG: Independently reviewed. EKG shows sinus rhythm.   Assessment/Plan 1. AKI - She has an elevated creatinine of 1.48. We/ll continue IV fluids.  Hold her ACE I.  2. Hyperkalemia . Will give a dose of Kayexalate.  She was given D50 and IV Insulin.  3. Elevated BNP:  I don't think she is in CHF.  Will follow.  If anything, she is volume depleted.  4. Hx of Afib - She is anticoagulated on coumadin, will continue per pharmacy dosing. INR is therapeutic.  EKG shows sinus rhythm.  5. DM. Stable.  6. HTN. Controlled. Will hold losartan.   7. HLD. Continue statin.  8. GERD. Continue Protonix.  9. Hypothyroidism, We'll continue synthroid. Check TSH.  10. Chronic knee pain. Continue pain management.  11. Hx of CAD with 2 stent placement. Noted.   DVT prophylaxis: SQ heparin  Code Status: Full  Family Communication: No family bedside Disposition Plan: Discharge home once improved.  Consults called: None  Admission status: Inpatient    Orvan Falconer, MD  FACP [facility] If 7PM-7AM, please contact night-coverage [email or web address] Password [email or web address]  04/08/2016, 10:53 PM   By signing my name below, I, Collene Leyden, attest that this documentation has been prepared under the direction and in the presence of Orvan Falconer, MD. Electronically signed: Collene Leyden, Scribe. 04/08/1710:21 PM

## 2016-04-09 ENCOUNTER — Observation Stay (HOSPITAL_COMMUNITY): Payer: Medicare HMO

## 2016-04-09 DIAGNOSIS — I482 Chronic atrial fibrillation: Secondary | ICD-10-CM | POA: Diagnosis not present

## 2016-04-09 DIAGNOSIS — Z7952 Long term (current) use of systemic steroids: Secondary | ICD-10-CM | POA: Diagnosis not present

## 2016-04-09 DIAGNOSIS — E875 Hyperkalemia: Secondary | ICD-10-CM

## 2016-04-09 DIAGNOSIS — Z7951 Long term (current) use of inhaled steroids: Secondary | ICD-10-CM | POA: Diagnosis not present

## 2016-04-09 DIAGNOSIS — M6281 Muscle weakness (generalized): Secondary | ICD-10-CM | POA: Diagnosis not present

## 2016-04-09 DIAGNOSIS — R488 Other symbolic dysfunctions: Secondary | ICD-10-CM | POA: Diagnosis not present

## 2016-04-09 DIAGNOSIS — E869 Volume depletion, unspecified: Secondary | ICD-10-CM | POA: Diagnosis not present

## 2016-04-09 DIAGNOSIS — Z7901 Long term (current) use of anticoagulants: Secondary | ICD-10-CM

## 2016-04-09 DIAGNOSIS — I480 Paroxysmal atrial fibrillation: Secondary | ICD-10-CM | POA: Diagnosis not present

## 2016-04-09 DIAGNOSIS — R110 Nausea: Secondary | ICD-10-CM | POA: Diagnosis not present

## 2016-04-09 DIAGNOSIS — R69 Illness, unspecified: Secondary | ICD-10-CM | POA: Diagnosis not present

## 2016-04-09 DIAGNOSIS — J45909 Unspecified asthma, uncomplicated: Secondary | ICD-10-CM | POA: Diagnosis not present

## 2016-04-09 DIAGNOSIS — J45998 Other asthma: Secondary | ICD-10-CM | POA: Diagnosis not present

## 2016-04-09 DIAGNOSIS — I10 Essential (primary) hypertension: Secondary | ICD-10-CM | POA: Diagnosis not present

## 2016-04-09 DIAGNOSIS — M1712 Unilateral primary osteoarthritis, left knee: Secondary | ICD-10-CM | POA: Diagnosis not present

## 2016-04-09 DIAGNOSIS — G9009 Other idiopathic peripheral autonomic neuropathy: Secondary | ICD-10-CM | POA: Diagnosis not present

## 2016-04-09 DIAGNOSIS — E119 Type 2 diabetes mellitus without complications: Secondary | ICD-10-CM | POA: Diagnosis not present

## 2016-04-09 DIAGNOSIS — R1310 Dysphagia, unspecified: Secondary | ICD-10-CM | POA: Diagnosis not present

## 2016-04-09 DIAGNOSIS — E038 Other specified hypothyroidism: Secondary | ICD-10-CM | POA: Diagnosis not present

## 2016-04-09 DIAGNOSIS — R279 Unspecified lack of coordination: Secondary | ICD-10-CM | POA: Diagnosis not present

## 2016-04-09 DIAGNOSIS — Z87891 Personal history of nicotine dependence: Secondary | ICD-10-CM | POA: Diagnosis not present

## 2016-04-09 DIAGNOSIS — I2510 Atherosclerotic heart disease of native coronary artery without angina pectoris: Secondary | ICD-10-CM | POA: Diagnosis not present

## 2016-04-09 DIAGNOSIS — E860 Dehydration: Secondary | ICD-10-CM | POA: Diagnosis not present

## 2016-04-09 DIAGNOSIS — N179 Acute kidney failure, unspecified: Secondary | ICD-10-CM | POA: Diagnosis not present

## 2016-04-09 DIAGNOSIS — G4700 Insomnia, unspecified: Secondary | ICD-10-CM | POA: Diagnosis not present

## 2016-04-09 DIAGNOSIS — E785 Hyperlipidemia, unspecified: Secondary | ICD-10-CM | POA: Diagnosis not present

## 2016-04-09 LAB — CBC
HCT: 34.4 % — ABNORMAL LOW (ref 36.0–46.0)
Hemoglobin: 10.7 g/dL — ABNORMAL LOW (ref 12.0–15.0)
MCH: 27.6 pg (ref 26.0–34.0)
MCHC: 31.1 g/dL (ref 30.0–36.0)
MCV: 88.7 fL (ref 78.0–100.0)
PLATELETS: 242 10*3/uL (ref 150–400)
RBC: 3.88 MIL/uL (ref 3.87–5.11)
RDW: 15.1 % (ref 11.5–15.5)
WBC: 8.3 10*3/uL (ref 4.0–10.5)

## 2016-04-09 LAB — COMPREHENSIVE METABOLIC PANEL
ALT: 9 U/L — AB (ref 14–54)
AST: 14 U/L — AB (ref 15–41)
Albumin: 3 g/dL — ABNORMAL LOW (ref 3.5–5.0)
Alkaline Phosphatase: 54 U/L (ref 38–126)
Anion gap: 5 (ref 5–15)
BILIRUBIN TOTAL: 0.3 mg/dL (ref 0.3–1.2)
BUN: 26 mg/dL — AB (ref 6–20)
CO2: 28 mmol/L (ref 22–32)
CREATININE: 1.1 mg/dL — AB (ref 0.44–1.00)
Calcium: 8.8 mg/dL — ABNORMAL LOW (ref 8.9–10.3)
Chloride: 107 mmol/L (ref 101–111)
GFR calc Af Amer: 55 mL/min — ABNORMAL LOW (ref >60)
GFR, EST NON AFRICAN AMERICAN: 47 mL/min — AB (ref >60)
Glucose, Bld: 97 mg/dL (ref 65–99)
Potassium: 4.3 mmol/L (ref 3.5–5.1)
Sodium: 140 mmol/L (ref 135–145)
TOTAL PROTEIN: 6.1 g/dL — AB (ref 6.5–8.1)

## 2016-04-09 LAB — MRSA PCR SCREENING: MRSA by PCR: NEGATIVE

## 2016-04-09 LAB — TSH: TSH: 0.864 u[IU]/mL (ref 0.350–4.500)

## 2016-04-09 MED ORDER — SODIUM POLYSTYRENE SULFONATE 15 GM/60ML PO SUSP
15.0000 g | Freq: Once | ORAL | Status: AC
  Administered 2016-04-09: 15 g via ORAL
  Filled 2016-04-09: qty 60

## 2016-04-09 MED ORDER — WARFARIN - PHARMACIST DOSING INPATIENT: Status: DC

## 2016-04-09 MED ORDER — NEPRO/CARBSTEADY PO LIQD: 237.0000 mL | Freq: Two times a day (BID) | ORAL | Status: DC

## 2016-04-09 MED ORDER — LEVOTHYROXINE SODIUM 25 MCG PO TABS
50.0000 ug | ORAL_TABLET | Freq: Every day | ORAL | Status: DC
  Administered 2016-04-09: 50 ug via ORAL
  Filled 2016-04-09: qty 2

## 2016-04-09 MED ORDER — ORAL CARE MOUTH RINSE
15.0000 mL | Freq: Two times a day (BID) | OROMUCOSAL | Status: DC
  Administered 2016-04-09 (×2): 15 mL via OROMUCOSAL

## 2016-04-09 MED ORDER — MOMETASONE FURO-FORMOTEROL FUM 200-5 MCG/ACT IN AERO
INHALATION_SPRAY | RESPIRATORY_TRACT | Status: AC
  Filled 2016-04-09: qty 8.8

## 2016-04-09 MED ORDER — ESCITALOPRAM OXALATE 10 MG PO TABS
20.0000 mg | ORAL_TABLET | Freq: Every day | ORAL | Status: DC
  Administered 2016-04-09: 20 mg via ORAL
  Filled 2016-04-09 (×2): qty 2

## 2016-04-09 MED ORDER — WARFARIN SODIUM 5 MG PO TABS: 5.0000 mg | ORAL_TABLET | Freq: Once | ORAL | Status: DC

## 2016-04-09 MED ORDER — ATORVASTATIN CALCIUM 40 MG PO TABS
40.0000 mg | ORAL_TABLET | Freq: Every day | ORAL | Status: DC
  Administered 2016-04-09: 40 mg via ORAL
  Filled 2016-04-09: qty 1

## 2016-04-09 MED ORDER — ALBUTEROL SULFATE HFA 108 (90 BASE) MCG/ACT IN AERS: 1.0000 | INHALATION_SPRAY | RESPIRATORY_TRACT | Status: DC | PRN

## 2016-04-09 MED ORDER — SODIUM CHLORIDE 0.9 % IV SOLN
INTRAVENOUS | Status: DC
  Administered 2016-04-09 (×2): via INTRAVENOUS

## 2016-04-09 MED ORDER — MOMETASONE FURO-FORMOTEROL FUM 200-5 MCG/ACT IN AERO
2.0000 | INHALATION_SPRAY | Freq: Two times a day (BID) | RESPIRATORY_TRACT | Status: DC
  Administered 2016-04-09: 2 via RESPIRATORY_TRACT
  Filled 2016-04-09: qty 8.8

## 2016-04-09 MED ORDER — OXYCODONE HCL 10 MG PO TABS: 10.0000 mg | ORAL_TABLET | Freq: Four times a day (QID) | ORAL | 0 refills | Status: DC | PRN

## 2016-04-09 MED ORDER — ALBUTEROL SULFATE (2.5 MG/3ML) 0.083% IN NEBU: 2.5000 mg | INHALATION_SOLUTION | Freq: Four times a day (QID) | RESPIRATORY_TRACT | Status: DC | PRN

## 2016-04-09 MED ORDER — DILTIAZEM HCL ER COATED BEADS 180 MG PO CP24
180.0000 mg | ORAL_CAPSULE | Freq: Every day | ORAL | Status: DC
  Administered 2016-04-09: 180 mg via ORAL
  Filled 2016-04-09: qty 1

## 2016-04-09 MED ORDER — ONDANSETRON 4 MG PO TBDP
4.0000 mg | ORAL_TABLET | Freq: Three times a day (TID) | ORAL | Status: DC | PRN
  Administered 2016-04-09: 4 mg via ORAL
  Filled 2016-04-09 (×2): qty 1

## 2016-04-09 MED ORDER — OXYCODONE HCL 5 MG PO TABS
10.0000 mg | ORAL_TABLET | Freq: Four times a day (QID) | ORAL | Status: DC | PRN
  Administered 2016-04-09 (×3): 10 mg via ORAL
  Filled 2016-04-09 (×3): qty 2

## 2016-04-09 MED ORDER — ACETAMINOPHEN 500 MG PO TABS: 500.0000 mg | ORAL_TABLET | Freq: Every day | ORAL | Status: DC | PRN

## 2016-04-09 MED ORDER — PANTOPRAZOLE SODIUM 40 MG PO TBEC
40.0000 mg | DELAYED_RELEASE_TABLET | Freq: Two times a day (BID) | ORAL | Status: DC
  Administered 2016-04-09: 40 mg via ORAL
  Filled 2016-04-09: qty 1

## 2016-04-09 NOTE — Progress Notes (Signed)
Initial Nutrition Assessment  DOCUMENTATION CODES:   Obesity unspecified  INTERVENTION:   - Encourage pt to consume oral nutrition supplements at [place] intake. - Will continue to monitor for nutrition-related needs.  NUTRITION DIAGNOSIS:   Inadequate oral intake related to poor appetite as evidenced by per patient/family report.  GOAL:   Patient will meet greater than or equal to 90% of their needs  MONITOR:   PO intake, Supplement acceptance, Labs, Weight trends, I & O's  REASON FOR ASSESSMENT:   Malnutrition Screening Tool   ASSESSMENT:   75 y.o. female with a past medical history significant for ASCVD s/p 2 stent placements, atrial fibrillation on coumadin, chronic knee pain, CAD, DM, GERD, HLD, and hypothyroidism presents with complaints of left knee pain due to ostearthritis that onset this afternoon. She is on oxycodone. Patient has associated abdominal discomfort, nausea, and vomiting but it is not new.  Denies chest pain.  Evalaution in the ER included a Potassium of 5.4, creatinine 1.48, BNP 216, INR 2.42. 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 EKG shows sinus rhythm.  Hospitalist was asked to admit her as she has AKI, felt to be pre renal, along with borderline hyperkalemia.  Spoke with pt at bedside. Pt reports she is about to be [facility] the [facility]. Pt states that her appetite was poor PTA. Pt states, "I could go 3 days without eating." Pt reports consuming Boost PTA and enjoying yogurt. Pt unable to provide further details regarding PO intake PTA. Per chart, pt consumed 25-80% of meals during current hospitalization. Encouraged pt to consume oral nutrition supplements provided to her while at [facility]. Pt had Nepro Carb Steady ordered during current hospital stay. Per chart, pt refused the supplement each time it was offered.  Per chart, wt has been stable since February 2017. Pt  reports weighing 279# "a long time ago."  Pt reports taking vitamin D supplement at home. Pt states she ambulates with walker and cooks for herself. Pt reports difficulty swallowing unless she has chewed food thoroughly.  Medications reviewed and include 40 mg Lipitor daily, 50 mcg Synthroid daily, 40 mg Protonix daily, PRN Zofran  Labs reviewed and include elevated BUN (26 mg/dL), elevated creatinine (1.10 mg/dL), low calcium (8.8 mg/dL)  NFPE: Exam completed. No fat depletion, no muscle depletion, and no edema noted.  Diet Order:  Diet renal with fluid restriction Fluid restriction: 1200 mL Fluid; Room service appropriate? Yes; Fluid consistency: Thin  Skin:  Reviewed, no issues  Last BM:  04/08/16  Height:   Ht Readings from Last 1 Encounters:  04/09/16 5\' 1"  (1.549 m)    Weight:   Wt Readings from Last 1 Encounters:  04/09/16 207 lb 14.3 oz (94.3 kg)    Ideal Body Weight:  46.4 kg  BMI:  Body mass index is 39.28 kg/m.  Estimated Nutritional Needs:   Kcal:  1700-1900 (18-20 kcal/kg)  Protein:  56-65 grams (1.2-1.4 g/kg IBW)  Fluid:  1.2 L/day  EDUCATION NEEDS:   No education needs identified at this time  Jeb Levering Dietetic Intern Pager Number: [phone]

## 2016-04-09 NOTE — Care Management Note (Signed)
Case Management Note  Patient Details  Name: [name] MRN: [id number] Date of Birth: [birth year]  Subjective/Objective:                  Pt is from home, lives alone and was ind with ADL's at baseline. PT has recommended SNF, pt is agreeable. CSW has seen pt and arranged for placement. Discharging today.   Action/Plan: No CM needs.   Expected Discharge Date:      04/09/2016            Expected Discharge Plan:  [place]  In-House Referral:  Clinical Social Work  Discharge planning Services  CM Consult  Post Acute Care Choice:  NA Choice offered to:  NA   Status of Service:  Completed, signed off   Sherald Barge, RN 04/09/2016, 2:36 PM

## 2016-04-09 NOTE — Progress Notes (Signed)
ANTICOAGULATION CONSULT NOTE - Initial Consult  Pharmacy Consult for Coumadin (chronic Rx PTA) Indication: atrial fibrillation  Allergies  Allergen Reactions  . Demerol Nausea And Vomiting   Patient Measurements: Height: 5' 1"  (154.9 cm) Weight: 207 lb 14.3 oz (94.3 kg) IBW/kg (Calculated) : 47.8  Vital Signs: Temp: 97 F (36.1 C) (10/17 0730) Temp Source: Oral (10/17 0730) BP: 148/76 (10/17 1119) Pulse Rate: 56 (10/17 1119)  Labs:  Recent Labs  04/08/16 1734 04/09/16 0418  HGB 11.4* 10.7*  HCT 36.8 34.4*  PLT 248 242  LABPROT 26.7*  --   INR 2.42  --   CREATININE 1.48* 1.10*  TROPONINI <0.03  --     Estimated Creatinine Clearance: 44.9 mL/min (by C-G formula based on SCr of 1.1 mg/dL (H)).   Medical History: Past Medical History:  Diagnosis Date  . Allergy   . Allergy history unknown   . Anticoagulated   . ASCVD (arteriosclerotic cardiovascular disease)    stent to mid and proximal left anterior descending in 06/2002;drug eluting stent placed in the second diagnol in 08/2003 after  A  non-st elevation myocardial infarction   . Asthma   . Atrial fibrillation ([place])   . Chronic anticoagulation   . Chronic pain of left knee   . Coronary artery disease   . Diabetes mellitus    no insulin  . Diarrhea    acute  . Diarrhea   . FH: colonic polyps    adenomataous  . GERD (gastroesophageal reflux disease)   . Hyperlipidemia    pulmonary embolism 2000 and 09/2008  . Hypertension   . Hypothyroidism   . Insomnia   . Irritable bowel syndrome   . Paroxysmal atrial fibrillation (HCC)    normal LV function; episodes occurred in 205 and 09/2007  . Pedal edema   . Peripheral edema   . Pulmonary embolism ([place])    2000/09/2008  . Rectal bleeding   . Thyroid disease    hypothyroidism  . Tobacco user    stopped   . Venous stasis     Medications:  Prescriptions Prior to Admission  Medication Sig Dispense Refill Last Dose  . acetaminophen (TYLENOL) 500 MG tablet  Take 500 mg by mouth daily as needed for headache.   UNKNOWN  . albuterol (PROAIR HFA) 108 (90 Base) MCG/ACT inhaler INHALE 2 PUFFS UP TO FOUR TIMES DAILY AS NEEDED FOR WHEEZING. 8.5 g 2 UNKNOWN  . albuterol (PROVENTIL) (2.5 MG/3ML) 0.083% nebulizer solution Take 3 mLs (2.5 mg total) by nebulization every 6 (six) hours as needed for wheezing or shortness of breath. 75 mL 5 UNKNOWN  . atorvastatin (LIPITOR) 40 MG tablet Take 1 tablet (40 mg total) by mouth daily. (Patient taking differently: Take 40 mg by mouth at bedtime. ) 90 tablet 3 04/07/2016 at Unknown time  . Cholecalciferol (VITAMIN D3 PO) Take 1 tablet by mouth every evening.    04/07/2016 at Unknown time  . diltiazem (CARDIZEM CD) 180 MG 24 hr capsule TAKE 1 CAPSULE BY MOUTH ONCE A DAY. 30 capsule 11 04/07/2016 at Unknown time  . diphenoxylate-atropine (LOMOTIL) 2.5-0.025 MG tablet TAKE 1 TABLET BY MOUTH TWICE DAILY AS NEEDED. (Patient taking differently: TAKE 1 TABLET BY MOUTH TWICE DAILY AS NEEDED FOR DIARRHEA) 60 tablet 5 04/07/2016 at Unknown time  . escitalopram (LEXAPRO) 20 MG tablet TAKE ONE TABLET BY MOUTH ONCE DAILY. 30 tablet 0 04/07/2016 at Unknown time  . fluticasone-salmeterol (ADVAIR HFA) 230-21 MCG/ACT inhaler Inhale 2 puffs into the lungs  2 (two) times daily. 1 Inhaler 12 04/08/2016 at Unknown time  . levothyroxine (SYNTHROID, LEVOTHROID) 50 MCG tablet TAKE ONE TABLET BY MOUTH ONCE DAILY. 30 tablet 5 04/07/2016 at Unknown time  . ondansetron (ZOFRAN ODT) 4 MG disintegrating tablet Take 1 tablet (4 mg total) by mouth every 8 (eight) hours as needed for nausea. 10 tablet 0 04/08/2016 at Unknown time  . Oxycodone HCl 10 MG TABS Take one tablet up to qid prn pain (Patient taking differently: Take 10 mg by mouth 4 (four) times daily as needed (FOR PAIN). ) 120 tablet 0 04/07/2016 at Unknown time  . pantoprazole (PROTONIX) 40 MG tablet Take 1 tablet (40 mg total) by mouth 2 (two) times daily before a meal. 60 tablet 3 04/07/2016 at  Unknown time  . promethazine (PHENERGAN) 12.5 MG tablet Take 1 tablet (12.5 mg total) by mouth every 6 (six) hours as needed for nausea or vomiting. 12 tablet 5 unknown  . valsartan (DIOVAN) 320 MG tablet TAKE (1) TABLET BY MOUTH ONCE DAILY. 30 tablet 11 04/08/2016 at Unknown time  . warfarin (COUMADIN) 5 MG tablet Take 5-7.5 mg by mouth daily. 5 mg daily except on  Wednesdays, take 7.39m   04/07/2016 at 2000  . Blood Glucose Monitoring Suppl w/Device KIT Please dispense Glucometer, Strips and all testing supplies per patient's preference or insurance.Test Glucose 1 time daily. Dx: E11.9 50 each 5     Assessment: 89yofemale on chronic Coumadin for h/o afib.  INR therapeutic on admission.  Home dose listed above.  Goal of Therapy:  INR 2-3 Monitor platelets by anticoagulation protocol: Yes   Plan:  Coumadin 535mpo today (per home regimen) INR daily Monitor for s/sx of bleeding complications  HaHart Robinsons 04/09/2016,11:26 AM

## 2016-04-09 NOTE — NC FL2 (Signed)
[place] LEVEL OF CARE SCREENING TOOL     IDENTIFICATION  Patient Name: [name] Birthdate: 06-20-1941 Sex: female Admission Date (Current Location): 04/08/2016  [facility] and [place] Number:  [employer] and Address:  [place] [address] [place]      Provider Number: [id number]  Attending Physician Name and Address:  Donne Hazel, MD  Relative Name and Phone Number:       Current Level of Care:   Recommended Level of Care:   Prior Approval Number:    Date Approved/Denied:   PASRR Number: [id number] ([id number])  Discharge Plan: SNF    Current Diagnoses: Patient Active Problem List   Diagnosis Date Noted  . Hyperkalemia 04/08/2016  . Volume depletion 04/08/2016  . AKI (acute kidney injury) ([place]) 04/08/2016  . Depression 04/07/2016  . Nausea without vomiting 05/09/2014  . Melena 05/08/2014  . UGI bleed 05/08/2014  . Dysphagia, unspecified(787.20) 10/26/2013  . Difficulty in walking(719.7) 10/19/2012  . Weakness of left leg 10/19/2012  . Long term current use of anticoagulant therapy 09/26/2012  . Dysphagia 05/04/2012  . Acute renal failure ([place]) 08/10/2011  . Microcytic anemia 08/10/2011  . Acute bronchitis 08/09/2011  . Acute respiratory failure ([place]) 08/09/2011  . IBS (irritable bowel syndrome) 08/09/2011  . Elevated serum creatinine 08/09/2011  . Chest pressure 03/05/2011  . DOE (dyspnea on exertion) 03/05/2011  . Anemia 03/05/2011  . Peripheral neuropathy ([place]) 10/02/2010  . 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 Hypothyroidism 12/02/2008  . DM 12/02/2008  . Hyperlipidemia LDL goal <70 12/02/2008  . Essential hypertension 12/02/2008  . Coronary atherosclerosis 12/02/2008  . FIBRILLATION, ATRIAL 12/02/2008  . Asthma 12/02/2008  . GERD 12/02/2008  . TOBACCO USE, QUIT 12/02/2008    Orientation RESPIRATION BLADDER Height & Weight     Self, Time,  Situation, Place  Normal Continent Weight: 207 lb 14.3 oz (94.3 kg) Height:  5\' 1"  (154.9 cm)  BEHAVIORAL SYMPTOMS/MOOD NEUROLOGICAL BOWEL NUTRITION STATUS      Continent Diet (Diet renal with fluid restriction 1200 mL fluid)  AMBULATORY STATUS COMMUNICATION OF NEEDS Skin   Limited Assist Verbally Normal                       Personal Care Assistance Level of Assistance  Bathing, Feeding, Dressing Bathing Assistance: Limited assistance Feeding assistance: Independent Dressing Assistance: Limited assistance     Functional Limitations Info  Sight, Hearing, Speech Sight Info: Adequate Hearing Info: Adequate Speech Info: Adequate    SPECIAL CARE FACTORS FREQUENCY  PT (By licensed PT)     PT Frequency: 5x/week              Contractures      Additional Factors Info  Code Status, Allergies, Psychotropic Code Status Info: Full Allergies Info: Demerol Psychotropic Info: Lexapro         Current Medications (04/09/2016):  This is the current hospital active medication list Current Facility-Administered Medications  Medication Dose Route Frequency Provider Last Rate Last Dose  . 0.9 %  sodium chloride infusion   Intravenous Continuous Orvan Falconer, MD 100 mL/hr at 04/09/16 0813    . acetaminophen (TYLENOL) tablet 500 mg  500 mg Oral Daily PRN Orvan Falconer, MD      . albuterol (PROVENTIL) (2.5 MG/3ML) 0.083% nebulizer solution 2.5 mg  2.5 mg Nebulization Q6H PRN Orvan Falconer, MD      . atorvastatin (LIPITOR)  tablet 40 mg  40 mg Oral QHS Orvan Falconer, MD   40 mg at 04/09/16 0113  . diltiazem (CARDIZEM CD) 24 hr capsule 180 mg  180 mg Oral Daily Orvan Falconer, MD   180 mg at 04/09/16 0806  . escitalopram (LEXAPRO) tablet 20 mg  20 mg Oral Daily Orvan Falconer, MD   20 mg at 04/09/16 0805  . feeding supplement (NEPRO CARB STEADY) liquid 237 mL  237 mL Oral BID BM Orvan Falconer, MD      . levothyroxine (SYNTHROID, LEVOTHROID) tablet 50 mcg  50 mcg Oral QAC breakfast Orvan Falconer, MD   50 mcg at 04/09/16 0805   . MEDLINE mouth rinse  15 mL Mouth Rinse BID Orvan Falconer, MD   15 mL at 04/09/16 0815  . mometasone-formoterol (DULERA) 200-5 MCG/ACT inhaler 2 puff  2 puff Inhalation BID Orvan Falconer, MD   2 puff at 04/09/16 0855  . ondansetron (ZOFRAN-ODT) disintegrating tablet 4 mg  4 mg Oral Q8H PRN Orvan Falconer, MD   4 mg at 04/09/16 0910  . oxyCODONE (Oxy IR/ROXICODONE) immediate release tablet 10 mg  10 mg Oral QID PRN Orvan Falconer, MD   10 mg at 04/09/16 0805  . pantoprazole (PROTONIX) EC tablet 40 mg  40 mg Oral BID AC Orvan Falconer, MD   40 mg at 04/09/16 0805  . warfarin (COUMADIN) tablet 5 mg  5 mg Oral Once Donne Hazel, MD      . Warfarin - Pharmacist Dosing Inpatient   Does not apply Q24H Donne Hazel, MD         Discharge Medications: Please see discharge summary for a list of discharge medications.  Relevant Imaging Results:  Relevant Lab Results:   Additional Information SSN [id number], Clydene Pugh, LCSW

## 2016-04-09 NOTE — Care Management Obs Status (Signed)
[place] NOTIFICATION   Patient Details  Name: Niajah ARIATNA JESTER MRN: [id number] Date of Birth: 18-Jun-1941   Medicare Observation Status Notification Given:  Other (see comment) (Pt discahrged <24hrs)    Sherald Barge, RN 04/09/2016, 2:35 PM

## 2016-04-09 NOTE — Progress Notes (Signed)
Approximately 0140 patient's rhythm flip flop between aflutter, afib with 15-19 beat run of VT, patient now back in SB 40-50's, has hx of afib , alert and oriented x 4, no complaints/ discomforts voiced, Dr Marin Comment notified, NNO at this time.

## 2016-04-09 NOTE — Clinical Social Work Note (Signed)
Clinical Social Work Assessment  Patient Details  Name: Riyah TEASIA ZAPF MRN: [id number] Date of Birth: [birth year]  Date of referral:  04/09/16               Reason for consult:  Facility Placement                Permission sought to share information with:    Permission granted to share information::     Name::        Agency::     Relationship::     Contact Information:     Housing/Transportation Living arrangements for the past 2 months:  Apartment Source of Information:  Patient Patient Interpreter Needed:  None Criminal Activity/Legal Involvement Pertinent to Current Situation/Hospitalization:  No - Comment as needed Significant Relationships:  None Lives with:  Self Do you feel safe going back to the place where you live?  Yes Need for family participation in patient care:  Yes (Comment)  Care giving concerns: None identified by patient.    Social Worker assessment / plan:  Patient statedthat she lives alone, uses a walker and is independent in her ADLs. She stated that she has someone to come and clean her apartment every two weeks.  Patient stated that she has no family support. Patient was agreeable to SNF. She stated that she wanted to go to [place] or [place] (as in facility placement with [facility] being first choice, then [facility]). CSW discussed insurance authorization issues that could pose a barrier to placement.   Employment status:  [employer] information:  Managed Medicare PT Recommendations:  [place] / Referral to community resources:  [place]  Patient/Family's Response to care:  Patient is agreeable to SNF.   Patient/Family's Understanding of and Emotional Response to Diagnosis, Current Treatment, and Prognosis:  Patient understands her diagnosis, treatment and prognosis.   Emotional Assessment Appearance:  Appears stated age Attitude/Demeanor/Rapport:   (Cooperative) Affect (typically observed):   Accepting Orientation:  Oriented to Self, Oriented to Place, Oriented to  Time, Oriented to Situation Alcohol / Substance use:  Not Applicable Psych involvement (Current and /or in the community):     Discharge Needs  Concerns to be addressed:  No discharge needs identified Readmission within the last 30 days:  No Current discharge risk:  Lives alone Barriers to Discharge:  No Barriers Identified   Ihor Gully, LCSW 04/09/2016, 12:16 PM

## 2016-04-09 NOTE — Clinical Social Work Placement (Signed)
   CLINICAL SOCIAL WORK PLACEMENT  NOTE  Date:  04/09/2016  Patient Details  Name: [name] MRN: [id number] Date of Birth: 11/12/1940  Clinical Social Work is seeking post-discharge placement for this patient at the [place] level of care (*CSW will initial, date and re-position this form in  chart as items are completed):  Yes   Patient/family provided with [place] Work Department's list of facilities offering this level of care within the geographic area requested by the patient (or if unable, by the patient's family).  Yes   Patient/family informed of their freedom to choose among providers that offer the needed level of care, that participate in Medicare, Medicaid or managed care program needed by the patient, have an available bed and are willing to accept the patient.  Yes   Patient/family informed of [place]'s ownership interest in [facility] and [facility], as well as of the fact that they are under no obligation to receive care at these facilities.  PASRR submitted to EDS on 04/09/16     PASRR number received on 04/09/16     Existing PASRR number confirmed on       FL2 transmitted to all facilities in geographic area requested by pt/family on 04/09/16     FL2 transmitted to all facilities within larger geographic area on       Patient informed that his/her managed care company has contracts with or will negotiate with certain facilities, including the following:        Yes   Patient/family informed of bed offers received.  Patient chooses bed at [facility]     Physician recommends and patient chooses bed at      Patient to be transferred to [facility] on 04/09/16.  Patient to be transferred to facility by staff     Patient family notified on 04/09/16 of transfer.  Name of family member notified:  Pt states she will notify family.     PHYSICIAN       Additional Comment:  PNC to start  authorization. 5 day LOG approved by supervisor. Discussed this with pt who is agreeable. RN will send scripts with pt.   _______________________________________________ Salome Arnt, [place] 04/09/2016, 2:13 PM [phone]

## 2016-04-09 NOTE — Clinical Social Work Placement (Signed)
   CLINICAL SOCIAL WORK PLACEMENT  NOTE  Date:  04/09/2016  Patient Details  Name: [name] MRN: [id number] Date of Birth: 1941/04/20  Clinical Social Work is seeking post-discharge placement for this patient at the [place] level of care (*CSW will initial, date and re-position this form in  chart as items are completed):  Yes   Patient/family provided with [place] Work Department's list of facilities offering this level of care within the geographic area requested by the patient (or if unable, by the patient's family).  Yes   Patient/family informed of their freedom to choose among providers that offer the needed level of care, that participate in Medicare, Medicaid or managed care program needed by the patient, have an available bed and are willing to accept the patient.  Yes   Patient/family informed of [place]'s ownership interest in [facility] and [facility], as well as of the fact that they are under no obligation to receive care at these facilities.  PASRR submitted to EDS on 04/09/16     PASRR number received on 04/09/16     Existing PASRR number confirmed on       FL2 transmitted to all facilities in geographic area requested by pt/family on 04/09/16     FL2 transmitted to all facilities within larger geographic area on       Patient informed that his/her managed care company has contracts with or will negotiate with certain facilities, including the following:            Patient/family informed of bed offers received.  Patient chooses bed at       Physician recommends and patient chooses bed at      Patient to be transferred to   on  .  Patient to be transferred to facility by       Patient family notified on   of transfer.  Name of family member notified:        PHYSICIAN       Additional Comment:    _______________________________________________ Ihor Gully, LCSW 04/09/2016, 12:49 PM

## 2016-04-09 NOTE — Evaluation (Signed)
Physical Therapy Evaluation Patient Details Name: [name] MRN: [id number] DOB: Jul 10, 1940 Today's Date: 04/09/2016   History of Present Illness  75 y.o. female with a past medical history significant for ASCVD s/p 2 stent placements, atrial fibrillation on coumadin, chronic knee pain, CAD, DM, GERD, HLD, and hypothyroidism presents with complaints of left knee pain due to ostearthritis that onset this afternoon. She is on oxycodone. Patient has associated abdominal discomfort, nausea, and vomiting but it is not new.  Denies chest pain.  Evalaution in the ER included a Potassium of 5.4, creatinine 1.48, BNP 216, INR 2.42. 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 EKG shows sinus rhythm.  Hospitalist was asked to admit her as she has AKI, felt to be pre renal, along with borderline hyperkalemia.   Overnight, pt experienced aflutter, afib with 15-19 beat run of VT, and convered back to SB.  Pt had a 4 beat run of PVC's just prior to mobilization.    Clinical Impression  Pt is received in bed, and is marginally agreeable to PT evaluation.  Pt expressed concern due to experiencing pain in L knee during mobility.  Increased time spent education pt on importance of mobility especially with OA.  Pt expressed that she lives alone, but has a friend that helps occasionally to clean her apt.  She uses a rollator for ambulation at all times, she still drives, but uses the motorized carts to get around the grocery store.  At start of PT evaluation telemetry picked up at 4 beat run of PVC's.  Pt's HR ranged from 52bpm - 70bpm throughout tx.  Pt is very fearful of mobility and has a history of at least 1 fall in the past 6 months.  Bed mobility and sit<>stand transfers performed with Mod A. Pt ambulated 23ft with RW and Min A due to need for constant cues for gait sequence as well as breathing due to performing valsalva.  Pt is very concerned about her safety and returning home alone.  At  this point, she is recommended for SNF due to high risk of falls with history of falls, fear of mobility, decreased strength, need for increased assistance for functional mobility at this time.      Follow Up Recommendations SNF     Equipment Recommendations  Rolling walker with 5" wheels (Pt has a rollator, but due to need for increased weight bearing through UE's, she would benefit from RW)    Recommendations for Other Services       Precautions / Restrictions Precautions Precautions: Fall Precaution Comments: after it had rained - fell beside the car - got a few bruises.  Restrictions Weight Bearing Restrictions: No      Mobility  Bed Mobility Overal bed mobility: Needs Assistance Bed Mobility: Supine to Sit     Supine to sit: Mod assist;HOB elevated     General bed mobility comments: Pt required increased time, as well as increased assistance to scoot hips to the EOB.  Bed pad used to assist pt with this due to increased pain in L LE, and fear of bending the L knee, as well as decreased strength and short stature.    Transfers Overall transfer level: Needs assistance Equipment used: Rolling walker (2 wheeled) Transfers: Sit to/from Stand Sit to Stand: Min assist;From elevated surface         General transfer comment: Pt required a significant amount of education for technique, as well as encouragement due to kinesiophobia.  Pt conitnued to  perform valsalva throughout transfer, and required multiple vc's for breathing.    Ambulation/Gait Ambulation/Gait assistance: Min assist Ambulation Distance (Feet): 10 Feet Assistive device: Rolling walker (2 wheeled) Gait Pattern/deviations: Step-to pattern;Decreased step length - right;Antalgic;Decreased weight shift to left   Gait velocity interpretation: <1.8 ft/sec, indicative of risk for recurrent falls General Gait Details: Extremely slowed gait speed today, with pt requiring 1 step commands for gait sequence due to fear  of falling.  continued vc's for breathing due to valsalva.  HR max during gait was 70bpm.    Stairs            Wheelchair Mobility    Modified Rankin (Stroke Patients Only)       Balance Overall balance assessment: History of Falls;Needs assistance Sitting-balance support: Bilateral upper extremity supported Sitting balance-Leahy Scale: Good     Standing balance support: Bilateral upper extremity supported Standing balance-Leahy Scale: Fair                               Pertinent Vitals/Pain Pain Assessment: 0-10 Pain Score: 2  Pain Location: left knee  Pain Descriptors / Indicators: Aching Pain Intervention(s): Limited activity within patient's tolerance;Patient requesting pain meds-RN notified (not yet time for pain meds. )    Home Living   Living Arrangements: Alone Available Help at Discharge: Friend(s) (friend cleans the apt.  She comes intermittently when the pt calls her for assistance.  ) Type of Home: Apartment Home Access: Ramped entrance;Stairs to enter   Entrance Stairs-Number of Steps: 1 step down and 1 step up on the back porch.   Home Layout: One level Home Equipment: Cane - single point;Shower seat;Walker - 4 wheels Additional Comments: pt states she does sponge baths.      Prior Function     Gait / Transfers Assistance Needed: Pt uses the RW all the time.    ADL's / Homemaking Assistance Needed: independent with ADL's.  Driving, Pt is independent with grocery shopping with motorized cart.        Hand Dominance   Dominant Hand: Right    Extremity/Trunk Assessment   Upper Extremity Assessment: Overall WFL for tasks assessed           Lower Extremity Assessment: LLE deficits/detail   LLE Deficits / Details: L knee with increased pain due to OA.  Pt holds this knee very rigid due to this and demonstrates kinesophobia with valsalva during attempts at mobilizing the knee.       Communication   Communication: No  difficulties  Cognition Arousal/Alertness: Awake/alert Behavior During Therapy: WFL for tasks assessed/performed Overall Cognitive Status: Within Functional Limits for tasks assessed                      General Comments      Exercises     Assessment/Plan    PT Assessment Patient needs continued PT services  PT Problem List Decreased strength;Decreased range of motion;Decreased activity tolerance;Decreased balance;Decreased mobility;Decreased knowledge of use of DME;Decreased safety awareness;Decreased knowledge of precautions;Cardiopulmonary status limiting activity;Pain;Obesity          PT Treatment Interventions DME instruction;Gait training;Functional mobility training;Therapeutic activities;Therapeutic exercise;Balance training;Patient/family education    PT Goals (Current goals can be found in the Care Plan section)  Acute Rehab PT Goals Patient Stated Goal: Pt wants to go back home, and be able to walk better.   PT Goal Formulation: With patient Time For Goal Achievement:  04/16/16 Potential to Achieve Goals: Fair    Frequency Min 3X/week   Barriers to discharge Decreased caregiver support Pt lives alone    Co-evaluation               End of Session Equipment Utilized During Treatment: Gait belt Activity Tolerance: Patient limited by fatigue;Patient limited by pain Patient left: in chair;with call bell/phone within reach;with nursing/sitter in room Nurse Communication: Mobility status Biochemist, clinical, RN present in room at end of tx, and educated to use RW for short distance ambulation. )    Functional Assessment Tool Used: [employer] "6-clicks"  Functional Limitation: Mobility: Walking and moving around Mobility: Walking and Moving Around Current Status [phone]): At least 60 percent but less than 80 percent impaired, limited or restricted Mobility: Walking and Moving Around Goal Status [phone]): At least 40 percent but less than 60 percent  impaired, limited or restricted    Time: 9791-5041 PT Time Calculation (min) (ACUTE ONLY): 45 min   Charges:   PT Evaluation $PT Eval Moderate Complexity: 1 Procedure PT Treatments $Gait Training: 8-22 mins $Therapeutic Activity: 8-22 mins   PT G Codes:   PT G-Codes **NOT FOR INPATIENT CLASS** Functional Assessment Tool Used: [employer] "6-clicks"  Functional Limitation: Mobility: Walking and moving around Mobility: Walking and Moving Around Current Status [phone]): At least 60 percent but less than 80 percent impaired, limited or restricted Mobility: Walking and Moving Around Goal Status [phone]): At least 40 percent but less than 60 percent impaired, limited or restricted    Beth Stellan Vick, PT, DPT X: [phone]

## 2016-04-09 NOTE — Discharge Summary (Signed)
Physician Discharge Summary  [name] GMW:[id number] DOB: [birth year] DOA: 04/08/2016  PCP: Mickie Hillier, MD  Admit date: 04/08/2016 Discharge date: 04/09/2016  Admitted From: Home Disposition:  SNF  Recommendations for Outpatient Follow-up:  1. Follow up with PCP in 1-2 weeks 2. Please obtain BMP in one week 3. Repeat INR in 2-3 days, goal INR 2-3  [place] controlled substance registry reviewed Patient was dispensed one month of oxycodone on 03/27/16. One tab of oxycodone will be prescribed because rehab facility requires prescription of patient's controlled substances.  Discharge Condition:SNF CODE STATUS:Full Diet recommendation: Regular   Brief/Interim Summary: 75 y.o. female with a past medical history significant for ASCVD s/p 2 stent placements, atrial fibrillation on coumadin, chronic knee pain, CAD, DM, GERD, HLD, and hypothyroidism presents with complaints of left knee pain due to ostearthritis. Patient presented with hyperkalemia and Cr of 1.48.  1. AKI - ACEI held. Patient continued on IVF overnight. Renal function improved  2. Hyperkalemia . Given kayexelate with normalization of potassium  3. Elevated BNP: Remained euvolemic. Not in CHF exacerbation 4. Hx of Afib - Remained on coumadin. Remained rate controlled 5. DM. Remained stable.  6. HTN. Remained stable. Held losartan per above   7. HLD. Continued statin.  8. GERD. Continued Protonix.  9. Hypothyroidism, continued synthroid.  10. Chronic knee pain. Continued pain management.  11. Hx of CAD with 2 stent placement. Remained stable.  12. Osteoarthritis: Seen by PT. Recs for SNF  Discharge Diagnoses:  Active Problems:   Hypothyroidism   FIBRILLATION, ATRIAL   GERD   Acute renal failure ([place])   Long term current use of anticoagulant therapy   Hyperkalemia   Volume depletion   AKI (acute kidney injury) [facility])    Discharge Instructions     Medication List    STOP taking these medications   valsartan  320 MG tablet Commonly known as:  DIOVAN     TAKE these medications   acetaminophen 500 MG tablet Commonly known as:  TYLENOL Take 500 mg by mouth daily as needed for headache.   albuterol (2.5 MG/3ML) 0.083% nebulizer solution Commonly known as:  PROVENTIL Take 3 mLs (2.5 mg total) by nebulization every 6 (six) hours as needed for wheezing or shortness of breath.   albuterol 108 (90 Base) MCG/ACT inhaler Commonly known as:  PROAIR HFA INHALE 2 PUFFS UP TO FOUR TIMES DAILY AS NEEDED FOR WHEEZING.   atorvastatin 40 MG tablet Commonly known as:  LIPITOR Take 1 tablet (40 mg total) by mouth daily. What changed:  when to take this   Blood Glucose Monitoring Suppl w/Device Kit Please dispense Glucometer, Strips and all testing supplies per patient's preference or insurance.Test Glucose 1 time daily. Dx: E11.9   diltiazem 180 MG 24 hr capsule Commonly known as:  CARDIZEM CD TAKE 1 CAPSULE BY MOUTH ONCE A DAY.   diphenoxylate-atropine 2.5-0.025 MG tablet Commonly known as:  LOMOTIL TAKE 1 TABLET BY MOUTH TWICE DAILY AS NEEDED. What changed:  See the new instructions.   escitalopram 20 MG tablet Commonly known as:  LEXAPRO TAKE ONE TABLET BY MOUTH ONCE DAILY.   fluticasone-salmeterol 230-21 MCG/ACT inhaler Commonly known as:  ADVAIR HFA Inhale 2 puffs into the lungs 2 (two) times daily.   levothyroxine 50 MCG tablet Commonly known as:  SYNTHROID, LEVOTHROID TAKE ONE TABLET BY MOUTH ONCE DAILY.   ondansetron 4 MG disintegrating tablet Commonly known as:  ZOFRAN ODT Take 1 tablet (4 mg total) by mouth every 8 (eight)  hours as needed for nausea.   Oxycodone HCl 10 MG Tabs Take one tablet up to qid prn pain What changed:  how much to take  how to take this  when to take this  reasons to take this  additional instructions   Oxycodone HCl 10 MG Tabs Take 1 tablet (10 mg total) by mouth 4 (four) times daily as needed (FOR PAIN). What changed:  You were already  taking a medication with the same name, and this prescription was added. Make sure you understand how and when to take each.   pantoprazole 40 MG tablet Commonly known as:  PROTONIX Take 1 tablet (40 mg total) by mouth 2 (two) times daily before a meal.   promethazine 12.5 MG tablet Commonly known as:  PHENERGAN Take 1 tablet (12.5 mg total) by mouth every 6 (six) hours as needed for nausea or vomiting.   VITAMIN D3 PO Take 1 tablet by mouth every evening.   warfarin 5 MG tablet Commonly known as:  COUMADIN Take 5-7.5 mg by mouth daily. 5 mg daily except on  Wednesdays, take 7.54m       Allergies  Allergen Reactions  . Demerol Nausea And Vomiting    Procedures/Studies: Dg Chest 2 View  Result Date: 04/08/2016 CLINICAL DATA:  Chronic left knee pain EXAM: CHEST  2 VIEW COMPARISON:  08/17/2015 FINDINGS: Cardiac shadow is mildly enlarged. Aortic calcifications are again seen. The lungs are well aerated bilaterally. No focal infiltrate or sizable effusion is seen. Stable compression deformities noted in the lower thoracic spine. No new bony abnormality is seen. IMPRESSION: No active cardiopulmonary disease. Electronically Signed   By: MInez CatalinaM.D.   On: 04/08/2016 17:36   Dg Knee Complete 4 Views Left  Result Date: 04/08/2016 CLINICAL DATA:  Chronic left knee pain EXAM: LEFT KNEE - COMPLETE 4+ VIEW COMPARISON:  Left knee radiograph 12/16/2012 FINDINGS: Compare to the prior examination, there is a new ossific fragment adjacent to the right medial femoral condyles. There is been worsening of femorotibial joint space loss with mild progression of osteophyte formation. No knee effusion. Patellofemoral space is preserved. No acute fracture or dislocation. IMPRESSION: 1. Worsening of moderate femorotibial osteoarthrosis compared to 12/16/2012. 2. No acute fracture or dislocation. Electronically Signed   By: KUlyses JarredM.D.   On: 04/08/2016 17:40    Subjective: Complains of  continued knee pains  Discharge Exam: Vitals:   04/09/16 1300 04/09/16 1400  BP: (!) 111/50 (!) 118/53  Pulse: (!) 47 (!) 55  Resp: 14 11  Temp:     Vitals:   04/09/16 1139 04/09/16 1200 04/09/16 1300 04/09/16 1400  BP:  (!) 85/50 (!) 111/50 (!) 118/53  Pulse: (!) 52 (!) 53 (!) 47 (!) 55  Resp: 14 14 14 11   Temp: 97.4 F (36.3 C)     TempSrc: Oral     SpO2: 96% (!) 89% (!) 85% (!) 88%  Weight:      Height:        General: Pt is alert, awake, not in acute distress Cardiovascular: RRR, S1/S2 +, no rubs, no gallops Respiratory: CTA bilaterally, no wheezing, no rhonchi Abdominal: Soft, NT, ND, bowel sounds + Extremities: no edema, no cyanosis   The results of significant diagnostics from this hospitalization (including imaging, microbiology, ancillary and laboratory) are listed below for reference.     Microbiology: Recent Results (from the past 240 hour(s))  MRSA PCR Screening     Status: None   Collection Time:  04/09/16 12:55 AM  Result Value Ref Range Status   MRSA by PCR NEGATIVE NEGATIVE Final    Comment:        The GeneXpert MRSA Assay (FDA approved for NASAL specimens only), is one component of a comprehensive MRSA colonization surveillance program. It is not intended to diagnose MRSA infection nor to guide or monitor treatment for MRSA infections.      Labs: BNP (last 3 results)  Recent Labs  04/08/16 1734  BNP 935.5*   Basic Metabolic Panel:  Recent Labs Lab 04/08/16 1734 04/09/16 0418  NA 141 140  K 5.4* 4.3  CL 106 107  CO2 29 28  GLUCOSE 91 97  BUN 31* 26*  CREATININE 1.48* 1.10*  CALCIUM 9.3 8.8*   Liver Function Tests:  Recent Labs Lab 04/08/16 1734 04/09/16 0418  AST 17 14*  ALT 11* 9*  ALKPHOS 58 54  BILITOT 0.4 0.3  PROT 6.5 6.1*  ALBUMIN 3.3* 3.0*   No results for input(s): LIPASE, AMYLASE in the last 168 hours. No results for input(s): AMMONIA in the last 168 hours. CBC:  Recent Labs Lab 04/08/16 1734  04/09/16 0418  WBC 9.2 8.3  NEUTROABS 6.6  --   HGB 11.4* 10.7*  HCT 36.8 34.4*  MCV 88.0 88.7  PLT 248 242   Cardiac Enzymes:  Recent Labs Lab 04/08/16 1734  TROPONINI <0.03   BNP: Invalid input(s): POCBNP CBG: No results for input(s): GLUCAP in the last 168 hours. D-Dimer No results for input(s): DDIMER in the last 72 hours. Hgb A1c No results for input(s): HGBA1C in the last 72 hours. Lipid Profile No results for input(s): CHOL, HDL, LDLCALC, TRIG, CHOLHDL, LDLDIRECT in the last 72 hours. Thyroid function studies  Recent Labs  04/08/16 1728  TSH 0.864   Anemia work up No results for input(s): VITAMINB12, FOLATE, FERRITIN, TIBC, IRON, RETICCTPCT in the last 72 hours. Urinalysis    Component Value Date/Time   COLORURINE YELLOW 04/08/2016 [place] 04/08/2016 1638   LABSPEC 1.015 04/08/2016 1638   PHURINE 5.5 04/08/2016 1638   GLUCOSEU NEGATIVE 04/08/2016 1638   HGBUR NEGATIVE 04/08/2016 [place] 04/08/2016 1638   KETONESUR NEGATIVE 04/08/2016 1638   PROTEINUR NEGATIVE 04/08/2016 1638   UROBILINOGEN 0.2 05/08/2014 2240   NITRITE NEGATIVE 04/08/2016 1638   LEUKOCYTESUR NEGATIVE 04/08/2016 1638   Sepsis Labs Invalid input(s): PROCALCITONIN,  WBC,  LACTICIDVEN Microbiology Recent Results (from the past 240 hour(s))  MRSA PCR Screening     Status: None   Collection Time: 04/09/16 12:55 AM  Result Value Ref Range Status   MRSA by PCR NEGATIVE NEGATIVE Final    Comment:        The GeneXpert MRSA Assay (FDA approved for NASAL specimens only), is one component of a comprehensive MRSA colonization surveillance program. It is not intended to diagnose MRSA infection nor to guide or monitor treatment for MRSA infections.      SIGNED:   Donne Hazel, MD  [facility] 04/09/2016, 2:32 PM  If 7PM-7AM, please contact night-coverage [email or web address] Password [email or web address]

## 2016-04-09 NOTE — Progress Notes (Signed)
Patient discharging to [facility]  IV removed - WNL.  Reviewed DC instructions - verbalizes understanding.  Report called and given to [facility] at [facility].  All questions answered.  Patient assisted to SNF via WC in NAD

## 2016-04-11 ENCOUNTER — Non-Acute Institutional Stay (SKILLED_NURSING_FACILITY): Payer: Medicare HMO | Admitting: Internal Medicine

## 2016-04-11 ENCOUNTER — Encounter (HOSPITAL_COMMUNITY)
Admission: RE | Admit: 2016-04-11 | Discharge: 2016-04-11 | Disposition: A | Discharge status: Home / Self Care | Payer: Medicare HMO | Source: Skilled Nursing Facility | Attending: Internal Medicine | Admitting: Internal Medicine

## 2016-04-11 ENCOUNTER — Encounter: Payer: Self-pay | Admitting: Internal Medicine

## 2016-04-11 DIAGNOSIS — E038 Other specified hypothyroidism: Secondary | ICD-10-CM | POA: Diagnosis not present

## 2016-04-11 DIAGNOSIS — R279 Unspecified lack of coordination: Secondary | ICD-10-CM | POA: Diagnosis not present

## 2016-04-11 DIAGNOSIS — E860 Dehydration: Secondary | ICD-10-CM | POA: Diagnosis not present

## 2016-04-11 DIAGNOSIS — G9009 Other idiopathic peripheral autonomic neuropathy: Secondary | ICD-10-CM | POA: Diagnosis not present

## 2016-04-11 DIAGNOSIS — N179 Acute kidney failure, unspecified: Secondary | ICD-10-CM | POA: Diagnosis not present

## 2016-04-11 DIAGNOSIS — M1712 Unilateral primary osteoarthritis, left knee: Secondary | ICD-10-CM | POA: Diagnosis not present

## 2016-04-11 DIAGNOSIS — R69 Illness, unspecified: Secondary | ICD-10-CM | POA: Diagnosis not present

## 2016-04-11 DIAGNOSIS — R110 Nausea: Secondary | ICD-10-CM | POA: Diagnosis not present

## 2016-04-11 DIAGNOSIS — R1310 Dysphagia, unspecified: Secondary | ICD-10-CM | POA: Diagnosis not present

## 2016-04-11 DIAGNOSIS — J45998 Other asthma: Secondary | ICD-10-CM | POA: Diagnosis not present

## 2016-04-11 DIAGNOSIS — I480 Paroxysmal atrial fibrillation: Secondary | ICD-10-CM

## 2016-04-11 DIAGNOSIS — R488 Other symbolic dysfunctions: Secondary | ICD-10-CM | POA: Diagnosis not present

## 2016-04-11 DIAGNOSIS — F331 Major depressive disorder, recurrent, moderate: Secondary | ICD-10-CM | POA: Diagnosis not present

## 2016-04-11 DIAGNOSIS — I2510 Atherosclerotic heart disease of native coronary artery without angina pectoris: Secondary | ICD-10-CM | POA: Diagnosis not present

## 2016-04-11 DIAGNOSIS — E875 Hyperkalemia: Secondary | ICD-10-CM

## 2016-04-11 DIAGNOSIS — G4700 Insomnia, unspecified: Secondary | ICD-10-CM | POA: Diagnosis not present

## 2016-04-11 DIAGNOSIS — E119 Type 2 diabetes mellitus without complications: Secondary | ICD-10-CM | POA: Diagnosis not present

## 2016-04-11 DIAGNOSIS — M6281 Muscle weakness (generalized): Secondary | ICD-10-CM | POA: Diagnosis not present

## 2016-04-11 DIAGNOSIS — I10 Essential (primary) hypertension: Secondary | ICD-10-CM

## 2016-04-11 LAB — PROTIME-INR
INR: 2.06
Prothrombin Time: 23.6 seconds — ABNORMAL HIGH (ref 11.4–15.2)

## 2016-04-11 NOTE — Progress Notes (Signed)
Provider:  Veleta Miners Location:   [place] Room Number: 128/P Place of Service:  SNF (31)  PCP: Mickie Hillier, MD Patient Care Team: Mikey Kirschner, MD as PCP - General (Family Medicine) Hennie Duos, MD (Rheumatology) Rogene Houston, MD (Gastroenterology)  Extended Emergency Contact Information Primary Emergency Contact: Purnell Shoemaker Address: [address], [place] Phone: [phone] Work Phone: [phone] Mobile Phone: [phone] Relation: Friend Secondary Emergency Contact: Roberts,Sharon Address: [address]          [place], [place] [address] [place] Phone: [phone] Relation: Niece  Code Status: DNR  Goals of Care: Advanced Directive information Advanced Directives 04/11/2016  Does patient have an advance directive? Yes  Type of Advance Directive Out of facility DNR (pink MOST or yellow form)  Does patient want to make changes to advanced directive? No - Patient declined  Copy of advanced directive(s) in chart? No - copy requested  Pre-existing out of facility DNR order (yellow form or pink MOST form) -      Chief Complaint  Patient presents with  . New Admit To SNF    HPI: Patient is a 75 y.o. female seen today for admission to SNF for therapy. Patient was admitted to the hospital with mild worsening of renal function and mild Hyperkalemia. She was treated with Hydration and was discharged next day to SNF. Her losartan which was thought to be cause of worsening Hyperkalemia was stopped.   Patient says she lives by herself in apartment and is having hard time to cope at home. She does not  have any kids and no close family around. It is hard for her due to Left leg hurting all the time. She was walking with the walker at home. She also feels very depressed.   Patient also Has h/o Chronic Atrial fibrillation on Coumadin, Hypertension, Hypothyroidism and  CAD s/p stent placement.and chronic dysphagia with nausea treated by Gastroenterologist.  Past Medical History:  Diagnosis Date  . Allergy   . Allergy history unknown   . Anticoagulated   . ASCVD (arteriosclerotic cardiovascular disease)    stent to mid and proximal left anterior descending in 06/2002;drug eluting stent placed in the second diagnol in 08/2003 after  A  non-st elevation myocardial infarction   . Asthma   . Atrial fibrillation ([place])   . Chronic anticoagulation   . Chronic pain of left knee   . Coronary artery disease   . Diabetes mellitus    no insulin  . Diarrhea    acute  . Diarrhea   . FH: colonic polyps    adenomataous  . GERD (gastroesophageal reflux disease)   . Hyperlipidemia    pulmonary embolism 2000 and 09/2008  . Hypertension   . Hypothyroidism   . Insomnia   . Irritable bowel syndrome   . Paroxysmal atrial fibrillation ([facility])    normal LV function; episodes occurred in 205 and 09/2007  . Pedal edema   . Peripheral edema   . Pulmonary embolism ([place])    2000/09/2008  . Rectal bleeding   . Thyroid disease    hypothyroidism  . Tobacco user    stopped   . Venous stasis    Past Surgical History:  Procedure Laterality Date  . ABDOMINAL HYSTERECTOMY    . APPENDECTOMY    . BALLOON DILATION N/A 11/11/2013   Procedure: BALLOON DILATION;  Surgeon: Rogene Houston, MD;  [facility];  Service: Endoscopy;  Laterality: N/A;  . BREAST REDUCTION SURGERY    . BREAST SURGERY    . CHOLECYSTECTOMY    . CLEFT PALATE REPAIR     4 surgeries  . COLONOSCOPY  2011   negative  . COLONOSCOPY N/A 05/10/2014   Procedure: COLONOSCOPY;  Surgeon: Rogene Houston, MD;  [facility];  Service: Endoscopy;  Laterality: N/A;  . ESOPHAGEAL DILATION N/A 03/08/2016   Procedure: ESOPHAGEAL DILATION;  Surgeon: Rogene Houston, MD;  [facility];  Service: Endoscopy;  Laterality: N/A;  . ESOPHAGOGASTRODUODENOSCOPY N/A 11/11/2013   Procedure:  ESOPHAGOGASTRODUODENOSCOPY (EGD);  Surgeon: Rogene Houston, MD;  [facility];  Service: Endoscopy;  Laterality: N/A;  200-moved to 100 Ann notified pt  . ESOPHAGOGASTRODUODENOSCOPY N/A 05/09/2014   Procedure: ESOPHAGOGASTRODUODENOSCOPY (EGD);  Surgeon: Rogene Houston, MD;  [facility];  Service: Endoscopy;  Laterality: N/A;  . ESOPHAGOGASTRODUODENOSCOPY N/A 03/08/2016   Procedure: ESOPHAGOGASTRODUODENOSCOPY (EGD);  Surgeon: Rogene Houston, MD;  [facility];  Service: Endoscopy;  Laterality: N/A;  2:20  . HEEL SPUR SURGERY    . KNEE SURGERY    . MALONEY DILATION N/A 11/11/2013   Procedure: Venia Minks DILATION;  Surgeon: Rogene Houston, MD;  [facility];  Service: Endoscopy;  Laterality: N/A;  . OOPHORECTOMY    . SAVORY DILATION N/A 11/11/2013   Procedure: SAVORY DILATION;  Surgeon: Rogene Houston, MD;  [facility];  Service: Endoscopy;  Laterality: N/A;    reports that she has quit smoking. Her smoking use included Cigarettes. She started smoking about 28 years ago. She has a 60.00 pack-year smoking history. She has never used smokeless tobacco. She reports that she does not drink alcohol or use drugs. Social History   Social History  . Marital status: Widowed    Spouse name: N/A  . Number of children: N/A  . Years of education: N/A   Occupational History  . retired Retired   Social History Main Topics  . Smoking status: Former Smoker    Packs/day: 2.00    Years: 30.00    Types: Cigarettes    Start date: 05/05/1987  . Smokeless tobacco: Never Used  . Alcohol use No  . Drug use: No  . Sexual activity: No   Other Topics Concern  . Not on file   Social History Narrative  . No narrative on file    Functional Status Survey:    Family History  Problem Relation Age of Onset  . Heart attack Mother   . Diabetes Mother   . Hyperlipidemia Mother   . Heart attack Father   . Lung cancer Sister   . Lymphoma Brother   .  Colon cancer Neg Hx     Health Maintenance  Topic Date Due  . OPHTHALMOLOGY EXAM  11/22/1948  . TETANUS/TDAP  11/22/1957  . ZOSTAVAX  11/23/1998  . PNA vac Low Risk Adult (2 of 2 - PCV13) 04/12/2014  . URINE MICROALBUMIN  05/07/2014  . HEMOGLOBIN A1C  10/04/2016  . FOOT EXAM  04/05/2017  . INFLUENZA VACCINE  Completed  . DEXA SCAN  Completed    Allergies  Allergen Reactions  . Demerol Nausea And Vomiting      Medication List       Accurate as of 04/11/16 10:27 AM. Always use your most recent med list.  acetaminophen 500 MG tablet Commonly known as:  TYLENOL Take 500 mg by mouth daily as needed for headache.   albuterol (2.5 MG/3ML) 0.083% nebulizer solution Commonly known as:  PROVENTIL Take 3 mLs (2.5 mg total) by nebulization every 6 (six) hours as needed for wheezing or shortness of breath.   albuterol 108 (90 Base) MCG/ACT inhaler Commonly known as:  PROAIR HFA INHALE 2 PUFFS UP TO FOUR TIMES DAILY AS NEEDED FOR WHEEZING.   atorvastatin 40 MG tablet Commonly known as:  LIPITOR Take 1 tablet (40 mg total) by mouth daily.   Blood Glucose Monitoring Suppl w/Device Kit Please dispense Glucometer, Strips and all testing supplies per patient's preference or insurance.Test Glucose 1 time daily. Dx: E11.9   diltiazem 180 MG 24 hr capsule Commonly known as:  CARDIZEM CD TAKE 1 CAPSULE BY MOUTH ONCE A DAY.   escitalopram 20 MG tablet Commonly known as:  LEXAPRO TAKE ONE TABLET BY MOUTH ONCE DAILY.   fluticasone-salmeterol 230-21 MCG/ACT inhaler Commonly known as:  ADVAIR HFA Inhale 2 puffs into the lungs 2 (two) times daily.   levothyroxine 50 MCG tablet Commonly known as:  SYNTHROID, LEVOTHROID TAKE ONE TABLET BY MOUTH ONCE DAILY.   Oxycodone HCl 10 MG Tabs Take 1 tablet (10 mg total) by mouth 4 (four) times daily as needed (FOR PAIN).   pantoprazole 40 MG tablet Commonly known as:  PROTONIX Take 1 tablet (40 mg total) by mouth 2 (two) times  daily before a meal.   promethazine 12.5 MG tablet Commonly known as:  PHENERGAN Take 1 tablet (12.5 mg total) by mouth every 6 (six) hours as needed for nausea or vomiting.   VITAMIN D3 PO Take 1 tablet by mouth every evening.   warfarin 5 MG tablet Commonly known as:  COUMADIN Take 5-7.5 mg by mouth daily. 5 mg daily except on  Wednesdays, take 7.23m       Review of Systems  Constitutional: Negative for activity change, appetite change, chills, diaphoresis, fatigue, fever and unexpected weight change.  Respiratory: Negative.  Negative for apnea, cough, choking, chest tightness, shortness of breath and stridor.   Cardiovascular: Negative.  Negative for chest pain, palpitations and leg swelling.  Gastrointestinal: Positive for nausea. Negative for abdominal distention, abdominal pain, anal bleeding, constipation, diarrhea and rectal pain.  Genitourinary: Negative.   Neurological: Positive for weakness. Negative for dizziness, seizures, facial asymmetry, speech difficulty, light-headedness, numbness and headaches.  Psychiatric/Behavioral: Positive for dysphoric mood and sleep disturbance. The patient is nervous/anxious.     There were no vitals filed for this visit. There is no height or weight on file to calculate BMI. Physical Exam  Constitutional: She is oriented to person, place, and time. She appears well-developed and well-nourished.  HENT:  Head: Normocephalic.  Mouth/Throat: Oropharynx is clear and moist.  Cardiovascular: Normal rate.  An irregularly irregular rhythm present.  Pulmonary/Chest: Effort normal and breath sounds normal. No respiratory distress. She has no wheezes. She has no rales. She exhibits no tenderness.  Abdominal: Soft. Bowel sounds are normal. She exhibits no distension and no mass. There is no tenderness. There is no rebound and no guarding.  Musculoskeletal: She exhibits no edema.  Neurological: She is alert and oriented to person, place, and time.    Good strength in all extremities except Left LE due to pain in her knee.    Labs reviewed: Basic Metabolic Panel:  Recent Labs  02/01/16 1443 04/08/16 1734 04/09/16 0418  NA 141 141 140  K 5.0  5.4* 4.3  CL 102 106 107  CO2 27 29 28   GLUCOSE 112* 91 97  BUN 26* 31* 26*  CREATININE 1.50* 1.48* 1.10*  CALCIUM 9.5 9.3 8.8*   Liver Function Tests:  Recent Labs  02/01/16 1443 04/08/16 1734 04/09/16 0418  AST 12 17 14*  ALT 9 11* 9*  ALKPHOS 65 58 54  BILITOT 0.3 0.4 0.3  PROT 6.5 6.5 6.1*  ALBUMIN 3.7 3.3* 3.0*    Recent Labs  01/21/16 0925 02/14/16 1341  LIPASE 24 20   No results for input(s): AMMONIA in the last 8760 hours. CBC:  Recent Labs  01/21/16 0925 02/01/16 1427 02/14/16 1341 04/08/16 1734 04/09/16 0418  WBC 14.4* 11.5* 10.8 9.2 8.3  NEUTROABS 11.9*  --  8.6* 6.6  --   HGB 12.4 12.5  --  11.4* 10.7*  HCT 38.8 39.4 40.3 36.8 34.4*  MCV 85.8 84.4 83 88.0 88.7  PLT 219 365 326 248 242   Cardiac Enzymes:  Recent Labs  01/21/16 0925 04/08/16 1734  TROPONINI <0.03 <0.03   BNP: Invalid input(s): POCBNP Lab Results  Component Value Date   HGBA1C 5.6 04/05/2016   Lab Results  Component Value Date   TSH 0.864 04/08/2016   Lab Results  Component Value Date   BTCYELYH90 931 09/23/2014   Lab Results  Component Value Date   FOLATE 7.4 08/10/2011   Lab Results  Component Value Date   IRON 32 (L) 08/10/2011   TIBC 329 08/10/2011   FERRITIN 20 08/10/2011    Imaging and Procedures obtained prior to SNF admission: No results found.  Assessment/Plan  Hyperkalemia with Mild dehydration  Patient has responded well. Her Valsartan was stopped. Her Potassium was 4.3 with creat of 1.10. Continue to monitor. Repeat Labs on Mon .   Essential hypertension Will monitor as her  Losartan was discontinued without any new med.   Paroxysmal atrial fibrillation  Rate controlled on chronic coumadin with last INR of 2.06. Will follow but  continue same dose for now.  Dysphagia Had EGD done on 09/17. No cause for Nausea or dysphagia.   Hypothyroidism TSH was normal. Continue same dose of Synthroid.   Depressive  Continue Lexapro. Will get Psych consult to see if we need to add another agent.      Family/ staff Communication:   Labs/tests ordered: Repeat BMP, CBC and INR on Monday.

## 2016-04-15 ENCOUNTER — Encounter: Payer: Self-pay | Admitting: Internal Medicine

## 2016-04-15 ENCOUNTER — Ambulatory Visit: Payer: Medicare HMO | Admitting: Family Medicine

## 2016-04-15 ENCOUNTER — Non-Acute Institutional Stay (SKILLED_NURSING_FACILITY): Payer: Medicare HMO | Admitting: Internal Medicine

## 2016-04-15 ENCOUNTER — Encounter (HOSPITAL_COMMUNITY)
Admission: RE | Admit: 2016-04-15 | Discharge: 2016-04-15 | Disposition: A | Discharge status: Home / Self Care | Payer: Medicare HMO | Source: Skilled Nursing Facility | Attending: *Deleted | Admitting: *Deleted

## 2016-04-15 DIAGNOSIS — G4700 Insomnia, unspecified: Secondary | ICD-10-CM | POA: Diagnosis not present

## 2016-04-15 DIAGNOSIS — R1310 Dysphagia, unspecified: Secondary | ICD-10-CM

## 2016-04-15 LAB — PROTIME-INR
INR: 2.3
PROTHROMBIN TIME: 25.7 s — AB (ref 11.4–15.2)

## 2016-04-15 NOTE — Progress Notes (Signed)
Location:   [facility] Room Number: 128/P Place of Service:  SNF [phone]) Provider:  Sherlyn Hay, MD  Patient Care Team: Mikey Kirschner, MD as PCP - General (Family Medicine) Hennie Duos, MD (Rheumatology) Rogene Houston, MD (Gastroenterology)  Extended Emergency Contact Information Primary Emergency Contact: Purnell Shoemaker Address: [address], [place] Phone: [phone] Work Phone: [phone] Mobile Phone: [phone] Relation: Friend Secondary Emergency Contact: Roberts,Sharon Address: [address]          [place], [place] [address] [place] Phone: [phone] Relation: Niece  Code Status:  DNR Goals of care: Advanced Directive information Advanced Directives 04/15/2016  Does patient have an advance directive? Yes  Type of Advance Directive Out of facility DNR (pink MOST or yellow form)  Does patient want to make changes to advanced directive? No - Patient declined  Copy of advanced directive(s) in chart? Yes  Pre-existing out of facility DNR order (yellow form or pink MOST form) -     Chief Complaint  Patient presents with  . Acute Visit    Needs sleep aide     HPI:  Pt is a 75 y.o. female seen today for an acute visit for  For help with her insomnia. Patient also c/o Nausea in the morning. She says it is hard for her to sleep here in Nursing home and was wondering if she can have something at night. It seems it is chronic problem for her at home also..  For her nausea she has seen Dr Laural Golden before and had EGD in 09/17 which was completely Normal. He did dilate her Esophagus. She is on Prochlorperazine PRN for Nausea. She is also on Protonix BID. Has not had any vomiting in the SNF.   Past Medical History:  Diagnosis Date  . Allergy   . Allergy history unknown   . Anticoagulated   . ASCVD (arteriosclerotic cardiovascular disease)    stent to  mid and proximal left anterior descending in 06/2002;drug eluting stent placed in the second diagnol in 08/2003 after  A  non-st elevation myocardial infarction   . Asthma   . Atrial fibrillation ([facility])   . Chronic anticoagulation   . Chronic pain of left knee   . Coronary artery disease   . Diabetes mellitus    no insulin  . Diarrhea    acute  . Diarrhea   . FH: colonic polyps    adenomataous  . GERD (gastroesophageal reflux disease)   . Hyperlipidemia    pulmonary embolism 2000 and 09/2008  . Hypertension   . Hypothyroidism   . Insomnia   . Irritable bowel syndrome   . Paroxysmal atrial fibrillation ([facility])    normal LV function; episodes occurred in 205 and 09/2007  . Pedal edema   . Peripheral edema   . Pulmonary embolism ([facility])    2000/09/2008  . Rectal bleeding   . Thyroid disease    hypothyroidism  . Tobacco user    stopped   . Venous stasis    Past Surgical History:  Procedure Laterality Date  . ABDOMINAL HYSTERECTOMY    . APPENDECTOMY    . BALLOON DILATION N/A 11/11/2013   Procedure: BALLOON DILATION;  Surgeon: Rogene Houston, MD;  [facility];  Service: Endoscopy;  Laterality: N/A;  . BREAST REDUCTION SURGERY    . BREAST SURGERY    .  CHOLECYSTECTOMY    . CLEFT PALATE REPAIR     4 surgeries  . COLONOSCOPY  2011   negative  . COLONOSCOPY N/A 05/10/2014   Procedure: COLONOSCOPY;  Surgeon: Rogene Houston, MD;  [facility];  Service: Endoscopy;  Laterality: N/A;  . ESOPHAGEAL DILATION N/A 03/08/2016   Procedure: ESOPHAGEAL DILATION;  Surgeon: Rogene Houston, MD;  [facility];  Service: Endoscopy;  Laterality: N/A;  . ESOPHAGOGASTRODUODENOSCOPY N/A 11/11/2013   Procedure: ESOPHAGOGASTRODUODENOSCOPY (EGD);  Surgeon: Rogene Houston, MD;  [facility];  Service: Endoscopy;  Laterality: N/A;  200-moved to 100 Ann notified pt  . ESOPHAGOGASTRODUODENOSCOPY N/A 05/09/2014   Procedure: ESOPHAGOGASTRODUODENOSCOPY (EGD);  Surgeon:  Rogene Houston, MD;  [facility];  Service: Endoscopy;  Laterality: N/A;  . ESOPHAGOGASTRODUODENOSCOPY N/A 03/08/2016   Procedure: ESOPHAGOGASTRODUODENOSCOPY (EGD);  Surgeon: Rogene Houston, MD;  [facility];  Service: Endoscopy;  Laterality: N/A;  2:20  . HEEL SPUR SURGERY    . KNEE SURGERY    . MALONEY DILATION N/A 11/11/2013   Procedure: Venia Minks DILATION;  Surgeon: Rogene Houston, MD;  [facility];  Service: Endoscopy;  Laterality: N/A;  . OOPHORECTOMY    . SAVORY DILATION N/A 11/11/2013   Procedure: SAVORY DILATION;  Surgeon: Rogene Houston, MD;  [facility];  Service: Endoscopy;  Laterality: N/A;    Allergies  Allergen Reactions  . Demerol Nausea And Vomiting      Medication List       Accurate as of 04/15/16 10:36 AM. Always use your most recent med list.          acetaminophen 500 MG tablet Commonly known as:  TYLENOL Take 500 mg by mouth daily as needed for headache.   albuterol (2.5 MG/3ML) 0.083% nebulizer solution Commonly known as:  PROVENTIL Take 3 mLs (2.5 mg total) by nebulization every 6 (six) hours as needed for wheezing or shortness of breath.   albuterol 108 (90 Base) MCG/ACT inhaler Commonly known as:  PROAIR HFA INHALE 2 PUFFS UP TO FOUR TIMES DAILY AS NEEDED FOR WHEEZING.   atorvastatin 40 MG tablet Commonly known as:  LIPITOR Take 1 tablet (40 mg total) by mouth daily.   Blood Glucose Monitoring Suppl w/Device Kit Please dispense Glucometer, Strips and all testing supplies per patient's preference or insurance.Test Glucose 1 time daily. Dx: E11.9   diltiazem 180 MG 24 hr capsule Commonly known as:  CARDIZEM CD TAKE 1 CAPSULE BY MOUTH ONCE A DAY.   escitalopram 20 MG tablet Commonly known as:  LEXAPRO TAKE ONE TABLET BY MOUTH ONCE DAILY.   fluticasone-salmeterol 230-21 MCG/ACT inhaler Commonly known as:  ADVAIR HFA Inhale 2 puffs into the lungs 2 (two) times daily.   levothyroxine 50 MCG  tablet Commonly known as:  SYNTHROID, LEVOTHROID TAKE ONE TABLET BY MOUTH ONCE DAILY.   Oxycodone HCl 10 MG Tabs Take 1 tablet (10 mg total) by mouth 4 (four) times daily as needed (FOR PAIN).   pantoprazole 40 MG tablet Commonly known as:  PROTONIX Take 1 tablet (40 mg total) by mouth 2 (two) times daily before a meal.   promethazine 12.5 MG tablet Commonly known as:  PHENERGAN Take 1 tablet (12.5 mg total) by mouth every 6 (six) hours as needed for nausea or vomiting.   VITAMIN D3 PO Take 1 tablet by mouth every evening.   warfarin 5 MG tablet Commonly known as:  COUMADIN Take 5-7.5 mg  by mouth daily. 5 mg daily except on  Wednesdays, take 7.80m       Review of Systems  Constitutional: Negative.   Respiratory: Negative.   Cardiovascular: Negative.   Gastrointestinal: Negative.     Immunization History  Administered Date(s) Administered  . DT 08/12/2013  . Influenza Split 04/12/2013  . Influenza, Seasonal, Injecte, Preservative Fre 03/27/2016  . Influenza,inj,Quad PF,36+ Mos 03/29/2014, 03/24/2015  . Pneumococcal Polysaccharide-23 04/12/2013   Pertinent  Health Maintenance Due  Topic Date Due  . OPHTHALMOLOGY EXAM  11/22/1950  . PNA vac Low Risk Adult (2 of 2 - PCV13) 04/12/2014  . URINE MICROALBUMIN  05/07/2014  . HEMOGLOBIN A1C  10/04/2016  . FOOT EXAM  04/05/2017  . INFLUENZA VACCINE  Completed  . DEXA SCAN  Completed   No flowsheet data found. Functional Status Survey:    There were no vitals filed for this visit. There is no height or weight on file to calculate BMI. Physical Exam  Constitutional: She is oriented to person, place, and time. She appears well-developed and well-nourished.  HENT:  Head: Normocephalic.  Mouth/Throat: Oropharynx is clear and moist.  Cardiovascular: Normal rate and normal heart sounds.   Pulmonary/Chest: Effort normal and breath sounds normal. No respiratory distress. She has no rales.  Abdominal: Soft. Bowel sounds are  normal. She exhibits no distension. There is no tenderness. There is no rebound.  Musculoskeletal: She exhibits edema.  Neurological: She is alert and oriented to person, place, and time.    Labs reviewed:  Recent Labs  02/01/16 1443 04/08/16 1734 04/09/16 0418  NA 141 141 140  K 5.0 5.4* 4.3  CL 102 106 107  CO2 27 29 28   GLUCOSE 112* 91 97  BUN 26* 31* 26*  CREATININE 1.50* 1.48* 1.10*  CALCIUM 9.5 9.3 8.8*    Recent Labs  02/01/16 1443 04/08/16 1734 04/09/16 0418  AST 12 17 14*  ALT 9 11* 9*  ALKPHOS 65 58 54  BILITOT 0.3 0.4 0.3  PROT 6.5 6.5 6.1*  ALBUMIN 3.7 3.3* 3.0*    Recent Labs  01/21/16 0925 02/01/16 1427 02/14/16 1341 04/08/16 1734 04/09/16 0418  WBC 14.4* 11.5* 10.8 9.2 8.3  NEUTROABS 11.9*  --  8.6* 6.6  --   HGB 12.4 12.5  --  11.4* 10.7*  HCT 38.8 39.4 40.3 36.8 34.4*  MCV 85.8 84.4 83 88.0 88.7  PLT 219 365 326 248 242   Lab Results  Component Value Date   TSH 0.864 04/08/2016   Lab Results  Component Value Date   HGBA1C 5.6 04/05/2016   Lab Results  Component Value Date   CHOL 153 09/20/2015   HDL 56 09/20/2015   LDLCALC 81 09/20/2015   TRIG 79 09/20/2015   CHOLHDL 2.7 09/20/2015    Significant Diagnostic Results in last 30 days:  Dg Chest 2 View  Result Date: 04/08/2016 CLINICAL DATA:  Chronic left knee pain EXAM: CHEST  2 VIEW COMPARISON:  08/17/2015 FINDINGS: Cardiac shadow is mildly enlarged. Aortic calcifications are again seen. The lungs are well aerated bilaterally. No focal infiltrate or sizable effusion is seen. Stable compression deformities noted in the lower thoracic spine. No new bony abnormality is seen. IMPRESSION: No active cardiopulmonary disease. Electronically Signed   By: MInez CatalinaM.D.   On: 04/08/2016 17:36   Dg Knee Complete 4 Views Left  Result Date: 04/08/2016 CLINICAL DATA:  Chronic left knee pain EXAM: LEFT KNEE - COMPLETE 4+ VIEW COMPARISON:  Left knee radiograph  12/16/2012 FINDINGS: Compare  to the prior examination, there is a new ossific fragment adjacent to the right medial femoral condyles. There is been worsening of femorotibial joint space loss with mild progression of osteophyte formation. No knee effusion. Patellofemoral space is preserved. No acute fracture or dislocation. IMPRESSION: 1. Worsening of moderate femorotibial osteoarthrosis compared to 12/16/2012. 2. No acute fracture or dislocation. Electronically Signed   By: Ulyses Jarred M.D.   On: 04/08/2016 17:40    Assessment/Plan Insomnia Will start her On Melatonin 5 mg  Also need to be seen by Psych for chronic Depression  Dysphagia  Continue Prochlorperazine prn for nausea.   Family/ staff Communication:   Labs/tests ordered:

## 2016-04-16 ENCOUNTER — Encounter (HOSPITAL_COMMUNITY)
Admission: RE | Admit: 2016-04-16 | Discharge: 2016-04-16 | Disposition: A | Discharge status: Home / Self Care | Payer: Medicare HMO | Source: Skilled Nursing Facility | Attending: *Deleted | Admitting: *Deleted

## 2016-04-16 LAB — BASIC METABOLIC PANEL
ANION GAP: 6 (ref 5–15)
BUN: 19 mg/dL (ref 6–20)
CHLORIDE: 107 mmol/L (ref 101–111)
CO2: 27 mmol/L (ref 22–32)
Calcium: 8.7 mg/dL — ABNORMAL LOW (ref 8.9–10.3)
Creatinine, Ser: 1.09 mg/dL — ABNORMAL HIGH (ref 0.44–1.00)
GFR calc Af Amer: 55 mL/min — ABNORMAL LOW (ref >60)
GFR, EST NON AFRICAN AMERICAN: 48 mL/min — AB (ref >60)
Glucose, Bld: 87 mg/dL (ref 65–99)
POTASSIUM: 3.8 mmol/L (ref 3.5–5.1)
SODIUM: 140 mmol/L (ref 135–145)

## 2016-04-16 LAB — CBC WITH DIFFERENTIAL/PLATELET
BASOS ABS: 0 10*3/uL (ref 0.0–0.1)
Basophils Relative: 0 %
EOS ABS: 0.4 10*3/uL (ref 0.0–0.7)
Eosinophils Relative: 4 %
HCT: 35.2 % — ABNORMAL LOW (ref 36.0–46.0)
HEMOGLOBIN: 11.1 g/dL — AB (ref 12.0–15.0)
LYMPHS ABS: 1.8 10*3/uL (ref 0.7–4.0)
LYMPHS PCT: 22 %
MCH: 27.5 pg (ref 26.0–34.0)
MCHC: 31.5 g/dL (ref 30.0–36.0)
MCV: 87.1 fL (ref 78.0–100.0)
Monocytes Absolute: 0.6 10*3/uL (ref 0.1–1.0)
Monocytes Relative: 7 %
NEUTROS PCT: 67 %
Neutro Abs: 5.7 10*3/uL (ref 1.7–7.7)
PLATELETS: 265 10*3/uL (ref 150–400)
RBC: 4.04 MIL/uL (ref 3.87–5.11)
RDW: 15.2 % (ref 11.5–15.5)
WBC: 8.5 10*3/uL (ref 4.0–10.5)

## 2016-04-22 ENCOUNTER — Encounter (HOSPITAL_COMMUNITY)
Admission: RE | Admit: 2016-04-22 | Discharge: 2016-04-22 | Disposition: A | Discharge status: Home / Self Care | Payer: Medicare HMO | Source: Skilled Nursing Facility | Attending: *Deleted | Admitting: *Deleted

## 2016-04-22 LAB — PROTIME-INR
INR: 2.08
PROTHROMBIN TIME: 23.7 s — AB (ref 11.4–15.2)

## 2016-04-24 ENCOUNTER — Ambulatory Visit: Payer: Medicare HMO

## 2016-04-29 ENCOUNTER — Non-Acute Institutional Stay (SKILLED_NURSING_FACILITY): Payer: Medicare HMO | Admitting: Internal Medicine

## 2016-04-29 ENCOUNTER — Encounter: Payer: Self-pay | Admitting: Internal Medicine

## 2016-04-29 DIAGNOSIS — I480 Paroxysmal atrial fibrillation: Secondary | ICD-10-CM | POA: Diagnosis not present

## 2016-04-29 DIAGNOSIS — N179 Acute kidney failure, unspecified: Secondary | ICD-10-CM | POA: Diagnosis not present

## 2016-04-29 DIAGNOSIS — R110 Nausea: Secondary | ICD-10-CM

## 2016-04-29 DIAGNOSIS — I10 Essential (primary) hypertension: Secondary | ICD-10-CM

## 2016-04-29 DIAGNOSIS — I2510 Atherosclerotic heart disease of native coronary artery without angina pectoris: Secondary | ICD-10-CM

## 2016-04-29 NOTE — Progress Notes (Signed)
Location:   [facility] Room Number: 128/P Place of Service:  SNF (31)  Provider: Granville Lewis  PCP: Mickie Hillier, MD Patient Care Team: Mikey Kirschner, MD as PCP - General (Family Medicine) Hennie Duos, MD (Rheumatology) Rogene Houston, MD (Gastroenterology)  Extended Emergency Contact Information Primary Emergency Contact: Purnell Shoemaker Address: [address], [place] Phone: [phone] Work Phone: [phone] Mobile Phone: [phone] Relation: Friend Secondary Emergency Contact: Roberts,Sharon Address: [address]          [place], [place] [address] [place] Phone: [phone] Relation: Niece  Code Status: DNR Goals of care:  Advanced Directive information Advanced Directives 04/29/2016  Does patient have an advance directive? Yes  Type of Advance Directive Out of facility DNR (pink MOST or yellow form)  Does patient want to make changes to advanced directive? No - Patient declined  Copy of advanced directive(s) in chart? Yes  Pre-existing out of facility DNR order (yellow form or pink MOST form) -     Allergies  Allergen Reactions  . Demerol Nausea And Vomiting    Chief Complaint  Patient presents with  . Discharge Note    HPI:  75 y.o. female  seen today for discharge.  Patient will be going home later this week she does live by herself.  She initially was admitted to the hospital with worsening renal function and mild hypokalemia-she was treated with hydration and has been discharged skilled nursing.  Her Losartan was discontinued because it was thought to be causing the worsening hyperkalemia  She says she is feeling stronger-is now using a walker.  She also was thought to be depressed when she first came here she appears to be in better spirits now more upbeat attitude she is on on Lexapro and this appears to be helping.  She also has a history  of chronic atrial fibrillation this appears rate controlled on diltiazem she is on Coumadin for anticoagulation last INR was therapeutic at 2.08 this will need to be updated.  She does have a history of dysphagia has been seen by GI still complains at times of nausea more so in the morning she is on protonix  and she will be followed up by Dr.Rehman  Currently she has no complaints except the nausea in the morning.  Her weight appears to be stable vital signs appear to be stable she has some fluctuation in her blood pressures ranging from 164/93-112/70 recently since she is about to go home when not be aggressive changing medications but this will warrant follow up by her primary care provider   \ Past Medical History:  Diagnosis Date  . Allergy   . Allergy history unknown   . Anticoagulated   . ASCVD (arteriosclerotic cardiovascular disease)    stent to mid and proximal left anterior descending in 06/2002;drug eluting stent placed in the second diagnol in 08/2003 after  A  non-st elevation myocardial infarction   . Asthma   . Atrial fibrillation ([place])   . Chronic anticoagulation   . Chronic pain of left knee   . Coronary artery disease   . Diabetes mellitus    no insulin  . Diarrhea    acute  . Diarrhea   . FH: colonic polyps    adenomataous  . GERD (gastroesophageal reflux disease)   . Hyperlipidemia    pulmonary embolism 2000 and 09/2008  .  Hypertension   . Hypothyroidism   . Insomnia   . Irritable bowel syndrome   . Paroxysmal atrial fibrillation (HCC)    normal LV function; episodes occurred in 205 and 09/2007  . Pedal edema   . Peripheral edema   . Pulmonary embolism ([facility])    2000/09/2008  . Rectal bleeding   . Thyroid disease    hypothyroidism  . Tobacco user    stopped   . Venous stasis     Past Surgical History:  Procedure Laterality Date  . ABDOMINAL HYSTERECTOMY    . APPENDECTOMY    . BALLOON DILATION N/A 11/11/2013   Procedure: BALLOON DILATION;  Surgeon:  Rogene Houston, MD;  [facility];  Service: Endoscopy;  Laterality: N/A;  . BREAST REDUCTION SURGERY    . BREAST SURGERY    . CHOLECYSTECTOMY    . CLEFT PALATE REPAIR     4 surgeries  . COLONOSCOPY  2011   negative  . COLONOSCOPY N/A 05/10/2014   Procedure: COLONOSCOPY;  Surgeon: Rogene Houston, MD;  [facility];  Service: Endoscopy;  Laterality: N/A;  . ESOPHAGEAL DILATION N/A 03/08/2016   Procedure: ESOPHAGEAL DILATION;  Surgeon: Rogene Houston, MD;  [facility];  Service: Endoscopy;  Laterality: N/A;  . ESOPHAGOGASTRODUODENOSCOPY N/A 11/11/2013   Procedure: ESOPHAGOGASTRODUODENOSCOPY (EGD);  Surgeon: Rogene Houston, MD;  [facility];  Service: Endoscopy;  Laterality: N/A;  200-moved to 100 Ann notified pt  . ESOPHAGOGASTRODUODENOSCOPY N/A 05/09/2014   Procedure: ESOPHAGOGASTRODUODENOSCOPY (EGD);  Surgeon: Rogene Houston, MD;  [facility];  Service: Endoscopy;  Laterality: N/A;  . ESOPHAGOGASTRODUODENOSCOPY N/A 03/08/2016   Procedure: ESOPHAGOGASTRODUODENOSCOPY (EGD);  Surgeon: Rogene Houston, MD;  [facility];  Service: Endoscopy;  Laterality: N/A;  2:20  . HEEL SPUR SURGERY    . KNEE SURGERY    . MALONEY DILATION N/A 11/11/2013   Procedure: Venia Minks DILATION;  Surgeon: Rogene Houston, MD;  [facility];  Service: Endoscopy;  Laterality: N/A;  . OOPHORECTOMY    . SAVORY DILATION N/A 11/11/2013   Procedure: SAVORY DILATION;  Surgeon: Rogene Houston, MD;  [facility];  Service: Endoscopy;  Laterality: N/A;      reports that she has quit smoking. Her smoking use included Cigarettes. She started smoking about 29 years ago. She has a 60.00 pack-year smoking history. She has never used smokeless tobacco. She reports that she does not drink alcohol or use drugs. Social History   Social History  . Marital status: Widowed    Spouse name: N/A  . Number of children: N/A  . Years of education:  N/A   Occupational History  . retired Retired   Social History Main Topics  . Smoking status: Former Smoker    Packs/day: 2.00    Years: 30.00    Types: Cigarettes    Start date: 05/05/1987  . Smokeless tobacco: Never Used  . Alcohol use No  . Drug use: No  . Sexual activity: No   Other Topics Concern  . Not on file   Social History Narrative  . No narrative on file   Functional Status Survey:    Allergies  Allergen Reactions  . Demerol Nausea And Vomiting    Pertinent  Health Maintenance Due  Topic Date Due  . OPHTHALMOLOGY EXAM  11/22/1950  . PNA vac Low Risk Adult (2 of 2 - PCV13) 04/12/2014  . URINE MICROALBUMIN  05/07/2014  .  HEMOGLOBIN A1C  10/04/2016  . FOOT EXAM  04/05/2017  . INFLUENZA VACCINE  Completed  . DEXA SCAN  Completed    Medications:   Medication List       Accurate as of 04/29/16  2:03 PM. Always use your most recent med list.          acetaminophen 500 MG tablet Commonly known as:  TYLENOL Take 500 mg by mouth daily as needed for headache.   albuterol (2.5 MG/3ML) 0.083% nebulizer solution Commonly known as:  PROVENTIL Take 3 mLs (2.5 mg total) by nebulization every 6 (six) hours as needed for wheezing or shortness of breath.   albuterol 108 (90 Base) MCG/ACT inhaler Commonly known as:  PROAIR HFA INHALE 2 PUFFS UP TO FOUR TIMES DAILY AS NEEDED FOR WHEEZING.   atorvastatin 40 MG tablet Commonly known as:  LIPITOR Take 1 tablet (40 mg total) by mouth daily.   Blood Glucose Monitoring Suppl w/Device Kit Please dispense Glucometer, Strips and all testing supplies per patient's preference or insurance.Test Glucose 1 time daily. Dx: E11.9   diltiazem 180 MG 24 hr capsule Commonly known as:  CARDIZEM CD TAKE 1 CAPSULE BY MOUTH ONCE A DAY.   escitalopram 20 MG tablet Commonly known as:  LEXAPRO TAKE ONE TABLET BY MOUTH ONCE DAILY.   fluticasone-salmeterol 230-21 MCG/ACT inhaler Commonly known as:  ADVAIR HFA Inhale 2 puffs  into the lungs 2 (two) times daily.   levothyroxine 50 MCG tablet Commonly known as:  SYNTHROID, LEVOTHROID TAKE ONE TABLET BY MOUTH ONCE DAILY.   Melatonin 5 MG Tabs Take 5 mg by mouth at bedtime as needed.   Oxycodone HCl 10 MG Tabs Take 1 tablet (10 mg total) by mouth 4 (four) times daily as needed (FOR PAIN).   pantoprazole 40 MG tablet Commonly known as:  PROTONIX Take 1 tablet (40 mg total) by mouth 2 (two) times daily before a meal.   promethazine 12.5 MG tablet Commonly known as:  PHENERGAN Take 1 tablet (12.5 mg total) by mouth every 6 (six) hours as needed for nausea or vomiting.   VITAMIN D3 PO Take 1 tablet by mouth every evening.   warfarin 5 MG tablet Commonly known as:  COUMADIN Take 5-7.5 mg by mouth daily. 5 mg daily except on  Wednesdays, take 7.77m       Review of Systems  Constitutional: Negative for activity change, appetite change, chills, diaphoresis, fatigue, fever and unexpected weight change.  Respiratory: Negative.  Negative for apnea, cough, choking, chest tightness, shortness of breath and stridor.   Cardiovascular: Negative.  Negative for chest pain, palpitations and leg swelling.  Gastrointestinal: Positive for nausea. Negative for abdominal distention, abdominal pain, anal bleeding, constipation, diarrhea and rectal pain.  Genitourinary: Negative.   Neurological: Positive for weakness but has gotten stronger. Negative for dizziness, seizures, facial asymmetry, speech difficulty, light-headedness, numbness and headaches.  Psychiatric/Behavioral:  Depressive symptoms appear to have improved recently which is encouraging she appears to be upbeat today very pleasant.     Vitals:   04/27/16 1350  BP: (!) 164/93  Pulse: 62  Resp: 20  Temp: 97.8 F (36.6 C)  TempSrc: Oral  Of note variable blood pressures as noted above There is no height or weight on file to calculate BMI. Physical Exam   Constitutional: She is oriented to person, place,  and time. She appears well-developed and well-nourished.  HENT:  Head: Normocephalic.  Mouth/Throat: Oropharynx is clear and moist.  Cardiovascular: Normal rate.  An  irregularly irregular rhythm present. She does not have significant lower extremity edema Pulmonary/Chest: Effort normal and breath sounds normal. No respiratory distress. She has no wheezes. She has no rales. She exhibits no tenderness.  Abdominal: Soft. Bowel sounds are normal. She exhibits no distension and no mass. There is no tenderness. There is no rebound and no guarding.  Musculoskeletal: She exhibits no edema.  Neurological: She is alert and oriented to person, place, and time.  Good strength in all extremities now using the walker    Labs reviewed: Basic Metabolic Panel:  Recent Labs  04/08/16 1734 04/09/16 0418 04/16/16 0500  NA 141 140 140  K 5.4* 4.3 3.8  CL 106 107 107  CO2 _0 GLUCOSE 91 97 87  BUN 31* 26* 19  CREATININE 1.48* 1.10* 1.09*  CALCIUM 9.3 8.8* 8.7*   Liver Function Tests:  Recent Labs  02/01/16 1443 04/08/16 1734 04/09/16 0418  AST 12 17 14*  ALT 9 11* 9*  ALKPHOS 65 58 54  BILITOT 0.3 0.4 0.3  PROT 6.5 6.5 6.1*  ALBUMIN 3.7 3.3* 3.0*    Recent Labs  01/21/16 0925 02/14/16 1341  LIPASE 24 20   No results for input(s): AMMONIA in the last 8760 hours. CBC:  Recent Labs  02/14/16 1341 04/08/16 1734 04/09/16 0418 04/16/16 0500  WBC 10.8 9.2 8.3 8.5  NEUTROABS 8.6* 6.6  --  5.7  HGB  --  11.4* 10.7* 11.1*  HCT 40.3 36.8 34.4* 35.2*  MCV 83 88.0 88.7 87.1  PLT 326 248 242 265   Cardiac Enzymes:  Recent Labs  01/21/16 0925 04/08/16 1734  TROPONINI <0.03 <0.03   BNP: Invalid input(s): POCBNP CBG: No results for input(s): GLUCAP in the last 8760 hours.  Procedures and Imaging Studies During Stay: Dg Chest 2 View  Result Date: 04/08/2016 CLINICAL DATA:  Chronic left knee pain EXAM: CHEST  2 VIEW COMPARISON:  08/17/2015 FINDINGS: Cardiac shadow  is mildly enlarged. Aortic calcifications are again seen. The lungs are well aerated bilaterally. No focal infiltrate or sizable effusion is seen. Stable compression deformities noted in the lower thoracic spine. No new bony abnormality is seen. IMPRESSION: No active cardiopulmonary disease. Electronically Signed   By: Inez Catalina M.D.   On: 04/08/2016 17:36   Dg Knee Complete 4 Views Left  Result Date: 04/08/2016 CLINICAL DATA:  Chronic left knee pain EXAM: LEFT KNEE - COMPLETE 4+ VIEW COMPARISON:  Left knee radiograph 12/16/2012 FINDINGS: Compare to the prior examination, there is a new ossific fragment adjacent to the right medial femoral condyles. There is been worsening of femorotibial joint space loss with mild progression of osteophyte formation. No knee effusion. Patellofemoral space is preserved. No acute fracture or dislocation. IMPRESSION: 1. Worsening of moderate femorotibial osteoarthrosis compared to 12/16/2012. 2. No acute fracture or dislocation. Electronically Signed   By: Ulyses Jarred M.D.   On: 04/08/2016 17:40    Assessment/Plan:    1 renal insufficiency with hyperkalemia-this appears to have resolved with interventions as noted above-last creatinine was 1.09 BUN of 19 on October 20 09/24/2015 will update this today nursing staff she is eating and drinking better although morning nausea has complicated her appetite at breakfast she will have GI follow-up as noted above.  #2 history of chronic dysphasia-still has at times some nausea she is on protonix she will be seeing GI for follow-up.  Number 3 history of atrial fibrillation she is on Coumadin for anticoagulation as well as diltiazem for  rate control this appears to be stable will update an INR and this will need follow-up when she is discharged as well by home health with primary care provider notified of results.  #4-as noted above she has variable systolic readings since she is about to go home would be hesitant to be  aggressive changing medications-this will warrant follow up by primary care provider-she continues on diltiazem 180 mg a day.  #5 coronary artery disease with previous history of stenting she is on a statin as well as anticoagulation this has not really been an issue during her stay here.  #6 history hypothyroidism she is on Synthroid TSH was within normal range in the hospital.  #7 depression she is on Lexapro she appears to be doing better in this regards follow-up by primary care provider as needed.  Of note for pain management she is on oxycodone when necessary 4 times a day this appears to be effective-her weakness appears to have improved as well although I believe she would benefit from PT and OT.  Currently she is using a walker  Patient has been advised to f/u with their PCP in 1-2 weeks to bring them up to date on their rehab stay.  Social services at facility was responsible for arranging this appointment.  Pt was provided with a 30 day supply of prescriptions for medications and refills must be obtained from their PCP.  For controlled substances, a more limited supply may be provided adequate until PCP appointment only.  [id number] note greater than 30 minutes spent on this discharge summary-greater than 50% of time spent coordinating plan of care for numerous diagnoses  Future labs/tests needed:

## 2016-04-30 ENCOUNTER — Encounter (HOSPITAL_COMMUNITY)
Admission: RE | Admit: 2016-04-30 | Discharge: 2016-04-30 | Disposition: A | Discharge status: Home / Self Care | Payer: Medicare HMO | Source: Skilled Nursing Facility | Attending: Internal Medicine | Admitting: Internal Medicine

## 2016-04-30 DIAGNOSIS — Z978 Presence of other specified devices: Secondary | ICD-10-CM | POA: Insufficient documentation

## 2016-04-30 DIAGNOSIS — Z7901 Long term (current) use of anticoagulants: Secondary | ICD-10-CM | POA: Insufficient documentation

## 2016-04-30 DIAGNOSIS — Z5189 Encounter for other specified aftercare: Secondary | ICD-10-CM | POA: Insufficient documentation

## 2016-04-30 DIAGNOSIS — Z9181 History of falling: Secondary | ICD-10-CM | POA: Insufficient documentation

## 2016-04-30 LAB — CBC
HCT: 35.1 % — ABNORMAL LOW (ref 36.0–46.0)
Hemoglobin: 11.3 g/dL — ABNORMAL LOW (ref 12.0–15.0)
MCH: 27.6 pg (ref 26.0–34.0)
MCHC: 32.2 g/dL (ref 30.0–36.0)
MCV: 85.8 fL (ref 78.0–100.0)
PLATELETS: 302 10*3/uL (ref 150–400)
RBC: 4.09 MIL/uL (ref 3.87–5.11)
RDW: 15.3 % (ref 11.5–15.5)
WBC: 7.9 10*3/uL (ref 4.0–10.5)

## 2016-04-30 LAB — BASIC METABOLIC PANEL
Anion gap: 6 (ref 5–15)
BUN: 14 mg/dL (ref 6–20)
CO2: 28 mmol/L (ref 22–32)
CREATININE: 0.94 mg/dL (ref 0.44–1.00)
Calcium: 8.9 mg/dL (ref 8.9–10.3)
Chloride: 106 mmol/L (ref 101–111)
GFR calc Af Amer: >60 mL/min (ref >60)
GFR, EST NON AFRICAN AMERICAN: 57 mL/min — AB (ref >60)
Glucose, Bld: 100 mg/dL — ABNORMAL HIGH (ref 65–99)
Potassium: 3.8 mmol/L (ref 3.5–5.1)
SODIUM: 140 mmol/L (ref 135–145)

## 2016-04-30 LAB — PROTIME-INR
INR: 1.81
PROTHROMBIN TIME: 21.2 s — AB (ref 11.4–15.2)

## 2016-05-02 DIAGNOSIS — Z87891 Personal history of nicotine dependence: Secondary | ICD-10-CM | POA: Diagnosis not present

## 2016-05-02 DIAGNOSIS — I482 Chronic atrial fibrillation: Secondary | ICD-10-CM | POA: Diagnosis not present

## 2016-05-02 DIAGNOSIS — N179 Acute kidney failure, unspecified: Secondary | ICD-10-CM | POA: Diagnosis not present

## 2016-05-02 DIAGNOSIS — J45909 Unspecified asthma, uncomplicated: Secondary | ICD-10-CM | POA: Diagnosis not present

## 2016-05-02 DIAGNOSIS — I10 Essential (primary) hypertension: Secondary | ICD-10-CM | POA: Diagnosis not present

## 2016-05-02 DIAGNOSIS — Z7952 Long term (current) use of systemic steroids: Secondary | ICD-10-CM | POA: Diagnosis not present

## 2016-05-02 DIAGNOSIS — Z7951 Long term (current) use of inhaled steroids: Secondary | ICD-10-CM | POA: Diagnosis not present

## 2016-05-02 DIAGNOSIS — E119 Type 2 diabetes mellitus without complications: Secondary | ICD-10-CM | POA: Diagnosis not present

## 2016-05-02 DIAGNOSIS — E785 Hyperlipidemia, unspecified: Secondary | ICD-10-CM | POA: Diagnosis not present

## 2016-05-02 DIAGNOSIS — Z7901 Long term (current) use of anticoagulants: Secondary | ICD-10-CM | POA: Diagnosis not present

## 2016-05-03 DIAGNOSIS — Z7901 Long term (current) use of anticoagulants: Secondary | ICD-10-CM | POA: Diagnosis not present

## 2016-05-03 DIAGNOSIS — Z87891 Personal history of nicotine dependence: Secondary | ICD-10-CM | POA: Diagnosis not present

## 2016-05-03 DIAGNOSIS — N179 Acute kidney failure, unspecified: Secondary | ICD-10-CM | POA: Diagnosis not present

## 2016-05-03 DIAGNOSIS — I482 Chronic atrial fibrillation: Secondary | ICD-10-CM | POA: Diagnosis not present

## 2016-05-03 DIAGNOSIS — E119 Type 2 diabetes mellitus without complications: Secondary | ICD-10-CM | POA: Diagnosis not present

## 2016-05-03 DIAGNOSIS — E785 Hyperlipidemia, unspecified: Secondary | ICD-10-CM | POA: Diagnosis not present

## 2016-05-03 DIAGNOSIS — Z7952 Long term (current) use of systemic steroids: Secondary | ICD-10-CM | POA: Diagnosis not present

## 2016-05-03 DIAGNOSIS — Z7951 Long term (current) use of inhaled steroids: Secondary | ICD-10-CM | POA: Diagnosis not present

## 2016-05-03 DIAGNOSIS — J45909 Unspecified asthma, uncomplicated: Secondary | ICD-10-CM | POA: Diagnosis not present

## 2016-05-03 DIAGNOSIS — I10 Essential (primary) hypertension: Secondary | ICD-10-CM | POA: Diagnosis not present

## 2016-05-06 ENCOUNTER — Ambulatory Visit (INDEPENDENT_AMBULATORY_CARE_PROVIDER_SITE_OTHER): Payer: Medicare HMO | Admitting: Family Medicine

## 2016-05-06 ENCOUNTER — Encounter: Payer: Self-pay | Admitting: Family Medicine

## 2016-05-06 VITALS — BP 116/72 | Ht 61.0 in | Wt 211.0 lb

## 2016-05-06 DIAGNOSIS — E119 Type 2 diabetes mellitus without complications: Secondary | ICD-10-CM | POA: Diagnosis not present

## 2016-05-06 DIAGNOSIS — Z7952 Long term (current) use of systemic steroids: Secondary | ICD-10-CM | POA: Diagnosis not present

## 2016-05-06 DIAGNOSIS — Z7951 Long term (current) use of inhaled steroids: Secondary | ICD-10-CM | POA: Diagnosis not present

## 2016-05-06 DIAGNOSIS — I10 Essential (primary) hypertension: Secondary | ICD-10-CM

## 2016-05-06 DIAGNOSIS — E785 Hyperlipidemia, unspecified: Secondary | ICD-10-CM | POA: Diagnosis not present

## 2016-05-06 DIAGNOSIS — R5383 Other fatigue: Secondary | ICD-10-CM | POA: Diagnosis not present

## 2016-05-06 DIAGNOSIS — Z7901 Long term (current) use of anticoagulants: Secondary | ICD-10-CM | POA: Diagnosis not present

## 2016-05-06 DIAGNOSIS — N179 Acute kidney failure, unspecified: Secondary | ICD-10-CM | POA: Diagnosis not present

## 2016-05-06 DIAGNOSIS — Z87891 Personal history of nicotine dependence: Secondary | ICD-10-CM | POA: Diagnosis not present

## 2016-05-06 DIAGNOSIS — I482 Chronic atrial fibrillation: Secondary | ICD-10-CM | POA: Diagnosis not present

## 2016-05-06 DIAGNOSIS — J45909 Unspecified asthma, uncomplicated: Secondary | ICD-10-CM | POA: Diagnosis not present

## 2016-05-06 MED ORDER — ESCITALOPRAM OXALATE 20 MG PO TABS: 20.0000 mg | ORAL_TABLET | Freq: Every day | ORAL | 5 refills | Status: DC

## 2016-05-06 NOTE — Progress Notes (Signed)
   Subjective:    Patient ID: [name], female    DOB: [birth year], 75 y.o.   MRN: [id number]  HPI Patient arrives for a follow up from recent hospitalization and 3 week stay at [facility] for rehab. Patient states she is still very tired and weak. Next  Entire hospital records reviewed. Entire nursing home records reviewed.  Valsartan was stopped in the hospital and in the nursing center. For some reason unclear to me patient resumed at 1 she went home. Was stopped mainly because of low blood pressure low volume and renal insufficiency. Next  Reports more difficulty with wheezing lately. More shortness of breath with exertion.  INR last week 1.8 borderline low for patient's baseline next  Ongoing fatigue and tiredness  INR at discharge 04/30/16 1.8 and patient on coumadin 5mg  daily.   Review of Systems right No headache, no major weight loss or weight gain, no chest pain no back pain abdominal pain no change in bowel habits complete ROS otherwise negative     Objective:   Physical Exam Alert no acute distress slightly tearful though emotionally HEENT normal lungs no wheezes currently no crackles no tachypnea heart rare rhythm obesity present Walker present ankles trace edema Blood pressure 115/72 on repeat      Assessment & Plan:  Impression 1 hypertension. With miscommunications somehow leading to patient still on valsartan. Asked to stop. #2 status post hospitalization for renal insufficiency and other issues potentially related to valsartan No. 3 chronic asthma substantial ongoing challenges #4 anticoagulation discussed plan just Coumadin one and half tablet on Wednesday. Stop valsartan. Recheck in one month. Diet exercise discussed

## 2016-05-07 DIAGNOSIS — E119 Type 2 diabetes mellitus without complications: Secondary | ICD-10-CM | POA: Diagnosis not present

## 2016-05-07 DIAGNOSIS — J45909 Unspecified asthma, uncomplicated: Secondary | ICD-10-CM | POA: Diagnosis not present

## 2016-05-07 DIAGNOSIS — I10 Essential (primary) hypertension: Secondary | ICD-10-CM | POA: Diagnosis not present

## 2016-05-07 DIAGNOSIS — Z7952 Long term (current) use of systemic steroids: Secondary | ICD-10-CM | POA: Diagnosis not present

## 2016-05-07 DIAGNOSIS — Z7951 Long term (current) use of inhaled steroids: Secondary | ICD-10-CM | POA: Diagnosis not present

## 2016-05-07 DIAGNOSIS — I482 Chronic atrial fibrillation: Secondary | ICD-10-CM | POA: Diagnosis not present

## 2016-05-07 DIAGNOSIS — Z7901 Long term (current) use of anticoagulants: Secondary | ICD-10-CM | POA: Diagnosis not present

## 2016-05-07 DIAGNOSIS — N179 Acute kidney failure, unspecified: Secondary | ICD-10-CM | POA: Diagnosis not present

## 2016-05-07 DIAGNOSIS — Z87891 Personal history of nicotine dependence: Secondary | ICD-10-CM | POA: Diagnosis not present

## 2016-05-07 DIAGNOSIS — E785 Hyperlipidemia, unspecified: Secondary | ICD-10-CM | POA: Diagnosis not present

## 2016-05-08 ENCOUNTER — Ambulatory Visit: Payer: Medicare HMO | Admitting: Cardiovascular Disease

## 2016-05-08 ENCOUNTER — Encounter (INDEPENDENT_AMBULATORY_CARE_PROVIDER_SITE_OTHER): Payer: Self-pay | Admitting: Internal Medicine

## 2016-05-08 ENCOUNTER — Ambulatory Visit: Payer: Medicare HMO | Admitting: Family Medicine

## 2016-05-08 ENCOUNTER — Ambulatory Visit (INDEPENDENT_AMBULATORY_CARE_PROVIDER_SITE_OTHER): Payer: Medicare HMO | Admitting: Internal Medicine

## 2016-05-08 VITALS — BP 154/80 | HR 76 | Temp 98.9°F | Ht 61.0 in | Wt 208.3 lb

## 2016-05-08 DIAGNOSIS — Z7951 Long term (current) use of inhaled steroids: Secondary | ICD-10-CM | POA: Diagnosis not present

## 2016-05-08 DIAGNOSIS — E785 Hyperlipidemia, unspecified: Secondary | ICD-10-CM | POA: Diagnosis not present

## 2016-05-08 DIAGNOSIS — I482 Chronic atrial fibrillation: Secondary | ICD-10-CM | POA: Diagnosis not present

## 2016-05-08 DIAGNOSIS — R110 Nausea: Secondary | ICD-10-CM | POA: Diagnosis not present

## 2016-05-08 DIAGNOSIS — I10 Essential (primary) hypertension: Secondary | ICD-10-CM | POA: Diagnosis not present

## 2016-05-08 DIAGNOSIS — E119 Type 2 diabetes mellitus without complications: Secondary | ICD-10-CM | POA: Diagnosis not present

## 2016-05-08 DIAGNOSIS — Z7901 Long term (current) use of anticoagulants: Secondary | ICD-10-CM | POA: Diagnosis not present

## 2016-05-08 DIAGNOSIS — J45909 Unspecified asthma, uncomplicated: Secondary | ICD-10-CM | POA: Diagnosis not present

## 2016-05-08 DIAGNOSIS — Z7952 Long term (current) use of systemic steroids: Secondary | ICD-10-CM | POA: Diagnosis not present

## 2016-05-08 DIAGNOSIS — Z87891 Personal history of nicotine dependence: Secondary | ICD-10-CM | POA: Diagnosis not present

## 2016-05-08 DIAGNOSIS — N179 Acute kidney failure, unspecified: Secondary | ICD-10-CM | POA: Diagnosis not present

## 2016-05-08 NOTE — Progress Notes (Signed)
Subjective:    Patient ID: [name], female    DOB: 07/27/1940, 75 y.o.   MRN: [id number]  HPI  [facility] today for [facility]u. [facility] problems with her left [facility]. She spent 3 weeks for Rehab.  Atrial fib, PE and maintained on Warfarin.  Hx of chronic nausea. She tells me her nausea is better.  She tells me she weak. Her appetite is not good.She is eating cereal in the morning and drinking Boost.  For supper she has a friend to bring her food or she will eat sandwich.  She has lost from 214 to 208.3 She has a BM about once a week. She is going to start Miralax.        03/08/2016 EGD/ED: Nausea, epigastric pain, dysphagia.  Impression:               - Normal esophagus.                           - Z-line regular, 36 cm from the incisors.                           - 2 cm hiatal hernia.                           - No endoscopic esophageal abnormality to explain                            patient's dysphagia. Esophagus dilated. Dilated.                           - Normal stomach.                           - Normal duodenal bulb and second portion of the                            duodenum.                           - No specimens collected.                           - Comment: Nausea may be secondary t or                            gastroparesis. If she remains with nauseashe may                            consider dropping narcotic dose.                           Since no structural abnormality noted to esophagus                            she possibly ha esophageal motility disorder. Moderate Sedation:  Review of Systems Past Medical History:  Diagnosis Date  . Allergy   . Allergy history unknown   .  Anticoagulated   . ASCVD (arteriosclerotic cardiovascular disease)    stent to mid and proximal left anterior descending in 06/2002;drug eluting stent placed in the second diagnol in 08/2003 after  A  non-st elevation  myocardial infarction   . Asthma   . Atrial fibrillation ([place])   . Chronic anticoagulation   . Chronic pain of left knee   . Coronary artery disease   . Diabetes mellitus    no insulin  . Diarrhea    acute  . Diarrhea   . FH: colonic polyps    adenomataous  . GERD (gastroesophageal reflux disease)   . Hyperlipidemia    pulmonary embolism 2000 and 09/2008  . Hypertension   . Hypothyroidism   . Insomnia   . Irritable bowel syndrome   . Paroxysmal atrial fibrillation (HCC)    normal LV function; episodes occurred in 205 and 09/2007  . Pedal edema   . Peripheral edema   . Pulmonary embolism ([place])    2000/09/2008  . Rectal bleeding   . Thyroid disease    hypothyroidism  . Tobacco user    stopped   . Venous stasis     Past Surgical History:  Procedure Laterality Date  . ABDOMINAL HYSTERECTOMY    . APPENDECTOMY    . BALLOON DILATION N/A 11/11/2013   Procedure: BALLOON DILATION;  Surgeon: Rogene Houston, MD;  [facility];  Service: Endoscopy;  Laterality: N/A;  . BREAST REDUCTION SURGERY    . BREAST SURGERY    . CHOLECYSTECTOMY    . CLEFT PALATE REPAIR     4 surgeries  . COLONOSCOPY  2011   negative  . COLONOSCOPY N/A 05/10/2014   Procedure: COLONOSCOPY;  Surgeon: Rogene Houston, MD;  [facility];  Service: Endoscopy;  Laterality: N/A;  . ESOPHAGEAL DILATION N/A 03/08/2016   Procedure: ESOPHAGEAL DILATION;  Surgeon: Rogene Houston, MD;  [facility];  Service: Endoscopy;  Laterality: N/A;  . ESOPHAGOGASTRODUODENOSCOPY N/A 11/11/2013   Procedure: ESOPHAGOGASTRODUODENOSCOPY (EGD);  Surgeon: Rogene Houston, MD;  [facility];  Service: Endoscopy;  Laterality: N/A;  200-moved to 100 Ann notified pt  . ESOPHAGOGASTRODUODENOSCOPY N/A 05/09/2014   Procedure: ESOPHAGOGASTRODUODENOSCOPY (EGD);  Surgeon: Rogene Houston, MD;  [facility];  Service: Endoscopy;  Laterality: N/A;  . ESOPHAGOGASTRODUODENOSCOPY N/A 03/08/2016    Procedure: ESOPHAGOGASTRODUODENOSCOPY (EGD);  Surgeon: Rogene Houston, MD;  [facility];  Service: Endoscopy;  Laterality: N/A;  2:20  . HEEL SPUR SURGERY    . KNEE SURGERY    . MALONEY DILATION N/A 11/11/2013   Procedure: Venia Minks DILATION;  Surgeon: Rogene Houston, MD;  [facility];  Service: Endoscopy;  Laterality: N/A;  . OOPHORECTOMY    . SAVORY DILATION N/A 11/11/2013   Procedure: SAVORY DILATION;  Surgeon: Rogene Houston, MD;  [facility];  Service: Endoscopy;  Laterality: N/A;    Allergies  Allergen Reactions  . Demerol Nausea And Vomiting    Current Outpatient Prescriptions on File Prior to Visit  Medication Sig Dispense Refill  . acetaminophen (TYLENOL) 500 MG tablet Take 500 mg by mouth daily as needed for headache.    . albuterol (PROAIR HFA) 108 (90 Base) MCG/ACT inhaler INHALE 2 PUFFS UP TO FOUR TIMES DAILY AS NEEDED FOR WHEEZING. 8.5 g 2  . albuterol (PROVENTIL) (2.5 MG/3ML) 0.083% nebulizer solution Take 3 mLs (2.5 mg total) by nebulization every 6 (six) hours as needed for  wheezing or shortness of breath. 75 mL 5  . atorvastatin (LIPITOR) 40 MG tablet Take 1 tablet (40 mg total) by mouth daily. 90 tablet 3  . Cholecalciferol (VITAMIN D3 PO) Take 1 tablet by mouth every evening.     . escitalopram (LEXAPRO) 20 MG tablet Take 1 tablet (20 mg total) by mouth daily. 30 tablet 5  . fluticasone-salmeterol (ADVAIR HFA) 230-21 MCG/ACT inhaler Inhale 2 puffs into the lungs 2 (two) times daily. 1 Inhaler 12  . levothyroxine (SYNTHROID, LEVOTHROID) 50 MCG tablet TAKE ONE TABLET BY MOUTH ONCE DAILY. 30 tablet 5  . oxyCODONE 10 MG TABS Take 1 tablet (10 mg total) by mouth 4 (four) times daily as needed (FOR PAIN). 1 tablet 0  . pantoprazole (PROTONIX) 40 MG tablet Take 1 tablet (40 mg total) by mouth 2 (two) times daily before a meal. 60 tablet 3  . promethazine (PHENERGAN) 12.5 MG tablet Take 1 tablet (12.5 mg total) by mouth every 6 (six) hours as  needed for nausea or vomiting. 12 tablet 5  . warfarin (COUMADIN) 5 MG tablet Take 5-7.5 mg by mouth daily. 5 mg daily except on  Wednesdays, take 7.55m    . Blood Glucose Monitoring Suppl w/Device KIT Please dispense Glucometer, Strips and all testing supplies per patient's preference or insurance.Test Glucose 1 time daily. Dx: E11.9 50 each 5  . [DISCONTINUED] ferrous sulfate 325 (65 FE) MG tablet Take 1 tablet (325 mg total) by mouth daily with breakfast. IRON SUPPLEMENT.    . [DISCONTINUED] ipratropium (ATROVENT) 0.02 % nebulizer solution Take 500 mcg by nebulization every 4 (four) hours as needed. For shortness of breath     No current facility-administered medications on file prior to visit.        Objective:   Physical Exam  Blood pressure (!) 154/80, pulse 76, temperature 98.9 F (37.2 C), height 5' 1"  (1.549 m), weight 208 lb 4.8 oz (94.5 kg).  Alert and oriented. Skin warm and dry. Oral mucosa is moist.   . Sclera anicteric, conjunctivae is pink. Thyroid not enlarged. No cervical lymphadenopathy. Lungs clear. Heart regular rate and rhythm.  Abdomen is soft. Bowel sounds are positive. No hepatomegaly. No abdominal masses felt. No tenderness.  No edema to lower extremities.       Assessment & Plan:  Chronic Nausea. She says she has not had any nausea this week. Hx of diabetes x 2-3 years. Am going to get a gastric emptying study. Further recommendations to follow

## 2016-05-08 NOTE — Patient Instructions (Signed)
NM Gastric emptying study.  

## 2016-05-09 ENCOUNTER — Ambulatory Visit (INDEPENDENT_AMBULATORY_CARE_PROVIDER_SITE_OTHER): Payer: Medicare HMO | Admitting: Family Medicine

## 2016-05-09 ENCOUNTER — Encounter: Payer: Self-pay | Admitting: Family Medicine

## 2016-05-09 VITALS — BP 112/80 | Temp 98.6°F | Ht 66.0 in | Wt 209.0 lb

## 2016-05-09 DIAGNOSIS — Z7951 Long term (current) use of inhaled steroids: Secondary | ICD-10-CM | POA: Diagnosis not present

## 2016-05-09 DIAGNOSIS — E785 Hyperlipidemia, unspecified: Secondary | ICD-10-CM | POA: Diagnosis not present

## 2016-05-09 DIAGNOSIS — I10 Essential (primary) hypertension: Secondary | ICD-10-CM | POA: Diagnosis not present

## 2016-05-09 DIAGNOSIS — J019 Acute sinusitis, unspecified: Secondary | ICD-10-CM

## 2016-05-09 DIAGNOSIS — Z7952 Long term (current) use of systemic steroids: Secondary | ICD-10-CM | POA: Diagnosis not present

## 2016-05-09 DIAGNOSIS — Z87891 Personal history of nicotine dependence: Secondary | ICD-10-CM | POA: Diagnosis not present

## 2016-05-09 DIAGNOSIS — J45909 Unspecified asthma, uncomplicated: Secondary | ICD-10-CM | POA: Diagnosis not present

## 2016-05-09 DIAGNOSIS — B9689 Other specified bacterial agents as the cause of diseases classified elsewhere: Secondary | ICD-10-CM | POA: Diagnosis not present

## 2016-05-09 DIAGNOSIS — E119 Type 2 diabetes mellitus without complications: Secondary | ICD-10-CM | POA: Diagnosis not present

## 2016-05-09 DIAGNOSIS — N179 Acute kidney failure, unspecified: Secondary | ICD-10-CM | POA: Diagnosis not present

## 2016-05-09 DIAGNOSIS — Z7901 Long term (current) use of anticoagulants: Secondary | ICD-10-CM | POA: Diagnosis not present

## 2016-05-09 DIAGNOSIS — I482 Chronic atrial fibrillation: Secondary | ICD-10-CM | POA: Diagnosis not present

## 2016-05-09 MED ORDER — CEFPROZIL 500 MG PO TABS: 500.0000 mg | ORAL_TABLET | Freq: Two times a day (BID) | ORAL | 0 refills | Status: DC

## 2016-05-09 NOTE — Progress Notes (Signed)
   Subjective:    Patient ID: [name], female    DOB: [birth year], 75 y.o.   MRN: [id number]  Sinusitis  This is a new problem. The current episode started in the past 7 days. The problem is unchanged. The pain is moderate. Associated symptoms include congestion and coughing. (Fever) Past treatments include nothing. The treatment provided no relief.   Patient states that she has no other concerns at this time.  Patient with significant congestion coughing sinus pressure denies wheezing states has some shortness of breath (standard for this patient it is not severe  Review of Systems  HENT: Positive for congestion.   Respiratory: Positive for cough.    She denies high fever or wheezing.    Objective:   Physical Exam On exam lungs are clear mild sinus tenderness eardrums normal neck no masses not respiratory distress       Assessment & Plan:  Viral syndrome Secondary rhinosinusitis Antibiotic prescribed warning signs discussed follow-up if problems

## 2016-05-10 ENCOUNTER — Emergency Department (HOSPITAL_COMMUNITY): Payer: Medicare HMO

## 2016-05-10 ENCOUNTER — Encounter (HOSPITAL_COMMUNITY): Payer: Self-pay | Admitting: Emergency Medicine

## 2016-05-10 ENCOUNTER — Observation Stay (HOSPITAL_COMMUNITY)
Admission: EM | Admit: 2016-05-10 | Discharge: 2016-05-12 | Disposition: A | Discharge status: Home / Self Care | Payer: Medicare HMO | Attending: Internal Medicine | Admitting: Internal Medicine

## 2016-05-10 ENCOUNTER — Other Ambulatory Visit: Payer: Self-pay | Admitting: *Deleted

## 2016-05-10 ENCOUNTER — Telehealth: Payer: Self-pay | Admitting: Family Medicine

## 2016-05-10 DIAGNOSIS — R112 Nausea with vomiting, unspecified: Secondary | ICD-10-CM | POA: Diagnosis not present

## 2016-05-10 DIAGNOSIS — I4891 Unspecified atrial fibrillation: Secondary | ICD-10-CM | POA: Diagnosis not present

## 2016-05-10 DIAGNOSIS — N179 Acute kidney failure, unspecified: Secondary | ICD-10-CM | POA: Diagnosis not present

## 2016-05-10 DIAGNOSIS — R1084 Generalized abdominal pain: Secondary | ICD-10-CM | POA: Diagnosis not present

## 2016-05-10 DIAGNOSIS — N182 Chronic kidney disease, stage 2 (mild): Secondary | ICD-10-CM | POA: Diagnosis not present

## 2016-05-10 DIAGNOSIS — D72829 Elevated white blood cell count, unspecified: Secondary | ICD-10-CM | POA: Insufficient documentation

## 2016-05-10 DIAGNOSIS — Z7901 Long term (current) use of anticoagulants: Secondary | ICD-10-CM

## 2016-05-10 DIAGNOSIS — E1122 Type 2 diabetes mellitus with diabetic chronic kidney disease: Secondary | ICD-10-CM | POA: Insufficient documentation

## 2016-05-10 DIAGNOSIS — R197 Diarrhea, unspecified: Principal | ICD-10-CM | POA: Insufficient documentation

## 2016-05-10 DIAGNOSIS — Z87891 Personal history of nicotine dependence: Secondary | ICD-10-CM | POA: Diagnosis not present

## 2016-05-10 DIAGNOSIS — I482 Chronic atrial fibrillation, unspecified: Secondary | ICD-10-CM | POA: Diagnosis present

## 2016-05-10 DIAGNOSIS — E785 Hyperlipidemia, unspecified: Secondary | ICD-10-CM | POA: Diagnosis not present

## 2016-05-10 DIAGNOSIS — E119 Type 2 diabetes mellitus without complications: Secondary | ICD-10-CM | POA: Diagnosis not present

## 2016-05-10 DIAGNOSIS — I129 Hypertensive chronic kidney disease with stage 1 through stage 4 chronic kidney disease, or unspecified chronic kidney disease: Secondary | ICD-10-CM | POA: Diagnosis not present

## 2016-05-10 DIAGNOSIS — Z79899 Other long term (current) drug therapy: Secondary | ICD-10-CM | POA: Diagnosis not present

## 2016-05-10 DIAGNOSIS — Z7951 Long term (current) use of inhaled steroids: Secondary | ICD-10-CM | POA: Diagnosis not present

## 2016-05-10 DIAGNOSIS — I2510 Atherosclerotic heart disease of native coronary artery without angina pectoris: Secondary | ICD-10-CM | POA: Diagnosis not present

## 2016-05-10 DIAGNOSIS — I10 Essential (primary) hypertension: Secondary | ICD-10-CM | POA: Diagnosis present

## 2016-05-10 DIAGNOSIS — Z7952 Long term (current) use of systemic steroids: Secondary | ICD-10-CM | POA: Diagnosis not present

## 2016-05-10 DIAGNOSIS — E039 Hypothyroidism, unspecified: Secondary | ICD-10-CM | POA: Diagnosis present

## 2016-05-10 DIAGNOSIS — J45909 Unspecified asthma, uncomplicated: Secondary | ICD-10-CM | POA: Diagnosis not present

## 2016-05-10 DIAGNOSIS — K5730 Diverticulosis of large intestine without perforation or abscess without bleeding: Secondary | ICD-10-CM | POA: Diagnosis not present

## 2016-05-10 DIAGNOSIS — E869 Volume depletion, unspecified: Secondary | ICD-10-CM | POA: Diagnosis not present

## 2016-05-10 DIAGNOSIS — E669 Obesity, unspecified: Secondary | ICD-10-CM | POA: Diagnosis present

## 2016-05-10 HISTORY — DX: Depression, unspecified: F32.A

## 2016-05-10 HISTORY — DX: Other specified postprocedural states: Z98.890

## 2016-05-10 HISTORY — DX: Long term (current) use of systemic steroids: Z79.52

## 2016-05-10 HISTORY — DX: Nausea with vomiting, unspecified: R11.2

## 2016-05-10 HISTORY — DX: Acute myocardial infarction, unspecified: I21.9

## 2016-05-10 HISTORY — DX: Nausea: R11.0

## 2016-05-10 HISTORY — DX: Unspecified osteoarthritis, unspecified site: M19.90

## 2016-05-10 HISTORY — DX: Personal history of urinary calculi: Z87.442

## 2016-05-10 HISTORY — DX: Major depressive disorder, single episode, unspecified: F32.9

## 2016-05-10 HISTORY — DX: Sleep apnea, unspecified: G47.30

## 2016-05-10 LAB — LIPASE, BLOOD: LIPASE: 24 U/L (ref 11–51)

## 2016-05-10 LAB — CBC WITH DIFFERENTIAL/PLATELET
BASOS ABS: 0 10*3/uL (ref 0.0–0.1)
Basophils Relative: 0 %
Eosinophils Absolute: 0.2 10*3/uL (ref 0.0–0.7)
Eosinophils Relative: 1 %
HEMATOCRIT: 41.4 % (ref 36.0–46.0)
HEMOGLOBIN: 13.2 g/dL (ref 12.0–15.0)
LYMPHS PCT: 5 %
Lymphs Abs: 1 10*3/uL (ref 0.7–4.0)
MCH: 27.6 pg (ref 26.0–34.0)
MCHC: 31.9 g/dL (ref 30.0–36.0)
MCV: 86.6 fL (ref 78.0–100.0)
Monocytes Absolute: 1.1 10*3/uL — ABNORMAL HIGH (ref 0.1–1.0)
Monocytes Relative: 5 %
NEUTROS ABS: 19.8 10*3/uL — AB (ref 1.7–7.7)
Neutrophils Relative %: 89 %
Platelets: 303 10*3/uL (ref 150–400)
RBC: 4.78 MIL/uL (ref 3.87–5.11)
RDW: 15.3 % (ref 11.5–15.5)
WBC: 22.1 10*3/uL — AB (ref 4.0–10.5)

## 2016-05-10 LAB — LACTIC ACID, PLASMA
Lactic Acid, Venous: 1.3 mmol/L (ref 0.5–1.9)
Lactic Acid, Venous: 1 mmol/L (ref 0.5–1.9)

## 2016-05-10 LAB — URINALYSIS, ROUTINE W REFLEX MICROSCOPIC
Bilirubin Urine: NEGATIVE
Glucose, UA: NEGATIVE mg/dL
Hgb urine dipstick: NEGATIVE
Ketones, ur: 15 mg/dL — AB
Leukocytes, UA: NEGATIVE
Nitrite: NEGATIVE
Protein, ur: NEGATIVE mg/dL
Specific Gravity, Urine: >1.03 — ABNORMAL HIGH (ref 1.005–1.030)
pH: 5.5 (ref 5.0–8.0)

## 2016-05-10 LAB — COMPREHENSIVE METABOLIC PANEL WITH GFR
ALT: 15 U/L (ref 14–54)
AST: 21 U/L (ref 15–41)
Albumin: 4 g/dL (ref 3.5–5.0)
Alkaline Phosphatase: 73 U/L (ref 38–126)
Anion gap: 9 (ref 5–15)
BUN: 16 mg/dL (ref 6–20)
CO2: 26 mmol/L (ref 22–32)
Calcium: 10 mg/dL (ref 8.9–10.3)
Chloride: 105 mmol/L (ref 101–111)
Creatinine, Ser: 1.03 mg/dL — ABNORMAL HIGH (ref 0.44–1.00)
GFR calc Af Amer: 59 mL/min — ABNORMAL LOW
GFR calc non Af Amer: 51 mL/min — ABNORMAL LOW
Glucose, Bld: 145 mg/dL — ABNORMAL HIGH (ref 65–99)
Potassium: 4.5 mmol/L (ref 3.5–5.1)
Sodium: 140 mmol/L (ref 135–145)
Total Bilirubin: 0.6 mg/dL (ref 0.3–1.2)
Total Protein: 7.7 g/dL (ref 6.5–8.1)

## 2016-05-10 LAB — PROTIME-INR
INR: 1.61
Prothrombin Time: 19.3 seconds — ABNORMAL HIGH (ref 11.4–15.2)

## 2016-05-10 LAB — POC OCCULT BLOOD, ED: Fecal Occult Bld: POSITIVE — AB

## 2016-05-10 LAB — TROPONIN I: Troponin I: <0.03 ng/mL (ref <0.03)

## 2016-05-10 MED ORDER — IOPAMIDOL (ISOVUE-300) INJECTION 61%
100.0000 mL | Freq: Once | INTRAVENOUS | Status: AC | PRN
  Administered 2016-05-10: 100 mL via INTRAVENOUS

## 2016-05-10 MED ORDER — ONDANSETRON HCL 4 MG/2ML IJ SOLN
4.0000 mg | Freq: Four times a day (QID) | INTRAMUSCULAR | Status: DC | PRN
  Administered 2016-05-10: 4 mg via INTRAVENOUS
  Filled 2016-05-10: qty 2

## 2016-05-10 MED ORDER — ATORVASTATIN CALCIUM 40 MG PO TABS
40.0000 mg | ORAL_TABLET | Freq: Every day | ORAL | Status: DC
  Administered 2016-05-10 – 2016-05-11 (×2): 40 mg via ORAL
  Filled 2016-05-10 (×2): qty 1

## 2016-05-10 MED ORDER — SODIUM CHLORIDE 0.9 % IV SOLN: INTRAVENOUS | Status: DC

## 2016-05-10 MED ORDER — METRONIDAZOLE 500 MG PO TABS
500.0000 mg | ORAL_TABLET | Freq: Three times a day (TID) | ORAL | Status: DC
  Administered 2016-05-10 – 2016-05-11 (×3): 500 mg via ORAL
  Filled 2016-05-10 (×3): qty 1

## 2016-05-10 MED ORDER — ALBUTEROL SULFATE (2.5 MG/3ML) 0.083% IN NEBU: 2.5000 mg | INHALATION_SOLUTION | Freq: Four times a day (QID) | RESPIRATORY_TRACT | Status: DC | PRN

## 2016-05-10 MED ORDER — ALBUTEROL SULFATE HFA 108 (90 BASE) MCG/ACT IN AERS: 2.0000 | INHALATION_SPRAY | RESPIRATORY_TRACT | Status: DC | PRN

## 2016-05-10 MED ORDER — ESCITALOPRAM OXALATE 10 MG PO TABS
20.0000 mg | ORAL_TABLET | Freq: Every day | ORAL | Status: DC
  Administered 2016-05-11 – 2016-05-12 (×2): 20 mg via ORAL
  Filled 2016-05-10 (×2): qty 2

## 2016-05-10 MED ORDER — OXYCODONE HCL 5 MG PO TABS
10.0000 mg | ORAL_TABLET | Freq: Four times a day (QID) | ORAL | Status: DC | PRN
  Administered 2016-05-11 – 2016-05-12 (×3): 10 mg via ORAL
  Filled 2016-05-10 (×3): qty 2

## 2016-05-10 MED ORDER — AMOXICILLIN 500 MG PO CAPS: 500.0000 mg | ORAL_CAPSULE | Freq: Three times a day (TID) | ORAL | 0 refills | Status: DC

## 2016-05-10 MED ORDER — WARFARIN SODIUM 5 MG PO TABS
5.0000 mg | ORAL_TABLET | Freq: Once | ORAL | Status: AC
  Administered 2016-05-10: 5 mg via ORAL
  Filled 2016-05-10: qty 1

## 2016-05-10 MED ORDER — WARFARIN - PHARMACIST DOSING INPATIENT
Freq: Every day | Status: DC
  Administered 2016-05-11: 1

## 2016-05-10 MED ORDER — PREDNISONE 10 MG PO TABS
5.0000 mg | ORAL_TABLET | ORAL | Status: DC
  Filled 2016-05-10: qty 1

## 2016-05-10 MED ORDER — PREDNISONE 5 MG PO TABS: 7.5000 mg | ORAL_TABLET | ORAL | Status: DC

## 2016-05-10 MED ORDER — LEVOTHYROXINE SODIUM 50 MCG PO TABS
50.0000 ug | ORAL_TABLET | Freq: Every day | ORAL | Status: DC
  Administered 2016-05-11 – 2016-05-12 (×2): 50 ug via ORAL
  Filled 2016-05-10 (×2): qty 1

## 2016-05-10 MED ORDER — ONDANSETRON HCL 4 MG PO TABS
4.0000 mg | ORAL_TABLET | Freq: Four times a day (QID) | ORAL | Status: DC | PRN
  Administered 2016-05-11: 4 mg via ORAL
  Filled 2016-05-10: qty 1

## 2016-05-10 MED ORDER — IOPAMIDOL (ISOVUE-300) INJECTION 61%
INTRAVENOUS | Status: AC
  Administered 2016-05-10: 30 mL
  Filled 2016-05-10: qty 30

## 2016-05-10 MED ORDER — KCL IN DEXTROSE-NACL 20-5-0.9 MEQ/L-%-% IV SOLN
INTRAVENOUS | Status: DC
Start: 2016-05-10 — End: 2016-05-12
  Administered 2016-05-10: 23:00:00 via INTRAVENOUS

## 2016-05-10 MED ORDER — PREDNISONE 10 MG PO TABS: 5.0000 mg | ORAL_TABLET | Freq: Every day | ORAL | Status: DC

## 2016-05-10 MED ORDER — MOMETASONE FURO-FORMOTEROL FUM 200-5 MCG/ACT IN AERO
2.0000 | INHALATION_SPRAY | Freq: Two times a day (BID) | RESPIRATORY_TRACT | Status: DC
  Administered 2016-05-11 – 2016-05-12 (×3): 2 via RESPIRATORY_TRACT
  Filled 2016-05-10: qty 8.8

## 2016-05-10 MED ORDER — ACETAMINOPHEN 500 MG PO TABS: 500.0000 mg | ORAL_TABLET | Freq: Every day | ORAL | Status: DC | PRN

## 2016-05-10 NOTE — Telephone Encounter (Signed)
Patient was seen early part of the week and medication made her sick Cefaprozil 500 gave her diarrhea,nausea,stomach pain headache. Can you call in something different to [facility]

## 2016-05-10 NOTE — ED Provider Notes (Signed)
[facility] DEPT Provider Note   CSN: [id number] Arrival date & time: 05/10/16  1808     History   Chief Complaint Chief Complaint  Patient presents with  . Emesis    HPI [name] is a 75 y.o. female.  HPI  Pt was seen at 1820. Per pt, c/o gradual onset and persistence of multiple intermittent episodes of N/V/D that began this morning. Pt states the took "one dose of an antibiotic for my sinus infection" and 1 hour later her symptoms began.   Describes the stools as "constant" and "watery." Denies CP/SOB, no back pain, no fevers, no black or blood in stools or emesis.    Past Medical History:  Diagnosis Date  . Allergy   . Allergy history unknown   . ASCVD (arteriosclerotic cardiovascular disease)    stent to mid and proximal left anterior descending in 06/2002;drug eluting stent placed in the second diagnol in 08/2003 after  A  non-st elevation myocardial infarction   . Asthma   . Atrial fibrillation ([place])   . Chronic anticoagulation   . Chronic nausea   . Chronic pain of left knee   . Coronary artery disease   . Diabetes mellitus    no insulin  . Diarrhea    acute  . FH: colonic polyps    adenomataous  . GERD (gastroesophageal reflux disease)   . Hyperlipidemia    pulmonary embolism 2000 and 09/2008  . Hypertension   . Hypothyroidism   . Insomnia   . Irritable bowel syndrome   . Paroxysmal atrial fibrillation (HCC)    normal LV function; episodes occurred in 205 and 09/2007  . Pedal edema   . Peripheral edema   . Pulmonary embolism ([place])    2000/09/2008  . Rectal bleeding   . Thyroid disease    hypothyroidism  . Tobacco user    stopped   . Venous stasis     Patient Active Problem List   Diagnosis Date Noted  . Insomnia 04/15/2016  . Hyperkalemia 04/08/2016  . Volume depletion 04/08/2016  . AKI (acute kidney injury) ([place]) 04/08/2016  . Depression 04/07/2016  . Nausea without vomiting 05/09/2014  . Melena 05/08/2014  . UGI bleed 05/08/2014    . Dysphagia, unspecified(787.20) 10/26/2013  . Difficulty in walking(719.7) 10/19/2012  . Weakness of left leg 10/19/2012  . Long term current use of anticoagulant therapy 09/26/2012  . Dysphagia 05/04/2012  . Microcytic anemia 08/10/2011  . Acute respiratory failure ([place]) 08/09/2011  . IBS (irritable bowel syndrome) 08/09/2011  . DOE (dyspnea on exertion) 03/05/2011  . Anemia 03/05/2011  . Peripheral neuropathy ([place]) 10/02/2010  . 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 Hypothyroidism 12/02/2008  . DM 12/02/2008  . Hyperlipidemia LDL goal <70 12/02/2008  . Essential hypertension 12/02/2008  . Coronary atherosclerosis 12/02/2008  . FIBRILLATION, ATRIAL 12/02/2008  . Asthma 12/02/2008  . GERD 12/02/2008  . TOBACCO USE, QUIT 12/02/2008    Past Surgical History:  Procedure Laterality Date  . ABDOMINAL HYSTERECTOMY    . APPENDECTOMY    . BALLOON DILATION N/A 11/11/2013   Procedure: BALLOON DILATION;  Surgeon: Rogene Houston, MD;  Location: [facility];  Service: Endoscopy;  Laterality: N/A;  . BREAST REDUCTION SURGERY    . BREAST SURGERY    . CHOLECYSTECTOMY    . CLEFT PALATE REPAIR     4 surgeries  . COLONOSCOPY  2011   negative  . COLONOSCOPY N/A 05/10/2014   Procedure:  COLONOSCOPY;  Surgeon: Rogene Houston, MD;  Location: [facility];  Service: Endoscopy;  Laterality: N/A;  . ESOPHAGEAL DILATION N/A 03/08/2016   Procedure: ESOPHAGEAL DILATION;  Surgeon: Rogene Houston, MD;  Location: [facility];  Service: Endoscopy;  Laterality: N/A;  . ESOPHAGOGASTRODUODENOSCOPY N/A 11/11/2013   Procedure: ESOPHAGOGASTRODUODENOSCOPY (EGD);  Surgeon: Rogene Houston, MD;  Location: [facility];  Service: Endoscopy;  Laterality: N/A;  200-moved to 100 Ann notified pt  . ESOPHAGOGASTRODUODENOSCOPY N/A 05/09/2014   Procedure: ESOPHAGOGASTRODUODENOSCOPY (EGD);  Surgeon: Rogene Houston, MD;  [facility]: [facility];  Service: Endoscopy;  Laterality:  N/A;  . ESOPHAGOGASTRODUODENOSCOPY N/A 03/08/2016   Procedure: ESOPHAGOGASTRODUODENOSCOPY (EGD);  Surgeon: Rogene Houston, MD;  [facility]: [facility];  Service: Endoscopy;  Laterality: N/A;  2:20  . HEEL SPUR SURGERY    . KNEE SURGERY    . MALONEY DILATION N/A 11/11/2013   Procedure: Venia Minks DILATION;  Surgeon: Rogene Houston, MD;  Location: [facility];  Service: Endoscopy;  Laterality: N/A;  . OOPHORECTOMY    . SAVORY DILATION N/A 11/11/2013   Procedure: SAVORY DILATION;  Surgeon: Rogene Houston, MD;  [facility]: [facility];  Service: Endoscopy;  Laterality: N/A;    OB History    No data available       Home Medications    Prior to Admission medications   Medication Sig Start Date End Date Taking? Authorizing Provider  acetaminophen (TYLENOL) 500 MG tablet Take 500 mg by mouth daily as needed for headache.    Historical Provider, MD  albuterol (PROAIR HFA) 108 (90 Base) MCG/ACT inhaler INHALE 2 PUFFS UP TO FOUR TIMES DAILY AS NEEDED FOR WHEEZING. 07/14/15   Kathyrn Drown, MD  albuterol (PROVENTIL) (2.5 MG/3ML) 0.083% nebulizer solution Take 3 mLs (2.5 mg total) by nebulization every 6 (six) hours as needed for wheezing or shortness of breath. 05/24/15   Kathyrn Drown, MD  amoxicillin (AMOXIL) 500 MG capsule Take 1 capsule (500 mg total) by mouth 3 (three) times daily. 05/10/16   Mikey Kirschner, MD  atorvastatin (LIPITOR) 40 MG tablet Take 1 tablet (40 mg total) by mouth daily. 07/28/15   Herminio Commons, MD  Blood Glucose Monitoring Suppl w/Device KIT Please dispense Glucometer, Strips and all testing supplies per patient's preference or insurance.Test Glucose 1 time daily. Dx: E11.9 04/05/16   Mikey Kirschner, MD  cefPROZIL (CEFZIL) 500 MG tablet Take 1 tablet (500 mg total) by mouth 2 (two) times daily. 05/09/16   Kathyrn Drown, MD  Cholecalciferol (VITAMIN D3 PO) Take 1 tablet by mouth every evening.     Historical Provider, MD  escitalopram (LEXAPRO) 20 MG tablet Take  1 tablet (20 mg total) by mouth daily. 05/06/16   Mikey Kirschner, MD  fluticasone-salmeterol (ADVAIR HFA) [phone] MCG/ACT inhaler Inhale 2 puffs into the lungs 2 (two) times daily. 08/30/15   Mikey Kirschner, MD  levothyroxine (SYNTHROID, LEVOTHROID) 50 MCG tablet TAKE ONE TABLET BY MOUTH ONCE DAILY. 01/29/16   Mikey Kirschner, MD  oxyCODONE 10 MG TABS Take 1 tablet (10 mg total) by mouth 4 (four) times daily as needed (FOR PAIN). 04/09/16   Donne Hazel, MD  pantoprazole (PROTONIX) 40 MG tablet Take 1 tablet (40 mg total) by mouth 2 (two) times daily before a meal. 02/23/16   Butch Penny, NP  promethazine (PHENERGAN) 12.5 MG tablet Take 1 tablet (12.5 mg total) by mouth every 6 (six) hours as  needed for nausea or vomiting. 02/29/16   Mikey Kirschner, MD  warfarin (COUMADIN) 5 MG tablet Take 5-7.5 mg by mouth daily. 5 mg daily except on  Wednesdays, take 7.60m    Historical Provider, MD    Family History Family History  Problem Relation Age of Onset  . Heart attack Mother   . Diabetes Mother   . Hyperlipidemia Mother   . Heart attack Father   . Lung cancer Sister   . Lymphoma Brother   . Colon cancer Neg Hx     Social History Social History  Substance Use Topics  . Smoking status: Former Smoker    Packs/day: 2.00    Years: 30.00    Types: Cigarettes    Start date: 05/05/1987  . Smokeless tobacco: Never Used  . Alcohol use No     Allergies   Demerol   Review of Systems Review of Systems ROS: Statement: All systems negative except as marked or noted in the HPI; Constitutional: Negative for fever and chills. ; ; Eyes: Negative for eye pain, redness and discharge. ; ; ENMT: Negative for ear pain, hoarseness, nasal congestion, sinus pressure and sore throat. ; ; Cardiovascular: Negative for chest pain, palpitations, diaphoresis, dyspnea and peripheral edema. ; ; Respiratory: Negative for cough, wheezing and stridor. ; ; Gastrointestinal: +N/V/D. Negative for blood in stool,  hematemesis, jaundice and rectal bleeding. . ; ; Genitourinary: Negative for dysuria, flank pain and hematuria. ; ; Musculoskeletal: Negative for back pain and neck pain. Negative for swelling and trauma.; ; Skin: Negative for pruritus, rash, abrasions, blisters, bruising and skin lesion.; ; Neuro: Negative for headache, lightheadedness and neck stiffness. Negative for weakness, altered level of consciousness, altered mental status, extremity weakness, paresthesias, involuntary movement, seizure and syncope.       Physical Exam Updated Vital Signs BP 159/91   Pulse 89   Temp 98.4 F (36.9 C) (Oral)   Resp 19   Ht 5' 1"  (1.549 m)   Wt 208 lb (94.3 kg)   SpO2 94%   BMI 39.30 kg/m   Physical Exam 1825: Physical examination:  Nursing notes reviewed; Vital signs and O2 SAT reviewed;  Constitutional: Well developed, Well nourished, Well hydrated, In no acute distress; Head:  Normocephalic, atraumatic; Eyes: EOMI, PERRL, No scleral icterus; ENMT: Mouth and pharynx normal, Mucous membranes moist; Neck: Supple, Full range of motion, No lymphadenopathy; Cardiovascular: Regular rate and rhythm, No gallop; Respiratory: Breath sounds clear & equal bilaterally, No wheezes.  Speaking full sentences with ease, Normal respiratory effort/excursion; Chest: Nontender, Movement normal; Abdomen: Soft, +mild diffuse tenderness to palp. No rebound or guarding. Nondistended, Normal bowel sounds. +copious foul smelling diarrhea.; Genitourinary: No CVA tenderness; Extremities: Pulses normal, No tenderness, No edema, No calf edema or asymmetry.; Neuro: AA&Ox3, Major CN grossly intact.  Speech clear. No gross focal motor deficits in extremities.; Skin: Color normal, Warm, Dry.   ED Treatments / Results  Labs (all labs ordered are listed, but only abnormal results are displayed)   EKG  EKG Interpretation  Date/Time:  Friday May 10 2016 18:37:58 EST Ventricular Rate:  86 PR Interval:    QRS Duration: 86 QT  Interval:  371 QTC Calculation: 444 R Axis:   -2 Text Interpretation:  Sinus rhythm Low voltage, precordial leads When compared with ECG of 04/08/2016 No significant change was found Confirmed by [facility] MD, KNunzio Cory([phone] on 05/10/2016 6:51:55 PM       Radiology   Procedures Procedures (including critical care time)  Medications Ordered in ED Medications  iopamidol (ISOVUE-300) 61 % injection (not administered)     Initial Impression / Assessment and Plan / ED Course  I have reviewed the triage vital signs and the nursing notes.  Pertinent labs & imaging results that were available during my care of the patient were reviewed by me and considered in my medical decision making (see chart for details).  MDM Reviewed: previous chart, nursing note and vitals Reviewed previous: labs and ECG Interpretation: labs, ECG, x-ray and CT scan   Results for orders placed or performed during the hospital encounter of 05/10/16  CBC with Differential  Result Value Ref Range   WBC 22.1 (H) 4.0 - 10.5 K/uL   RBC 4.78 3.87 - 5.11 MIL/uL   Hemoglobin 13.2 12.0 - 15.0 g/dL   HCT 41.4 36.0 - 46.0 %   MCV 86.6 78.0 - 100.0 fL   MCH 27.6 26.0 - 34.0 pg   MCHC 31.9 30.0 - 36.0 g/dL   RDW 15.3 11.5 - 15.5 %   Platelets 303 150 - 400 K/uL   Neutrophils Relative % 89 %   Neutro Abs 19.8 (H) 1.7 - 7.7 K/uL   Lymphocytes Relative 5 %   Lymphs Abs 1.0 0.7 - 4.0 K/uL   Monocytes Relative 5 %   Monocytes Absolute 1.1 (H) 0.1 - 1.0 K/uL   Eosinophils Relative 1 %   Eosinophils Absolute 0.2 0.0 - 0.7 K/uL   Basophils Relative 0 %   Basophils Absolute 0.0 0.0 - 0.1 K/uL  Comprehensive metabolic panel  Result Value Ref Range   Sodium 140 135 - 145 mmol/L   Potassium 4.5 3.5 - 5.1 mmol/L   Chloride 105 101 - 111 mmol/L   CO2 26 22 - 32 mmol/L   Glucose, Bld 145 (H) 65 - 99 mg/dL   BUN 16 6 - 20 mg/dL   Creatinine, Ser 1.03 (H) 0.44 - 1.00 mg/dL   Calcium 10.0 8.9 - 10.3 mg/dL   Total  Protein 7.7 6.5 - 8.1 g/dL   Albumin 4.0 3.5 - 5.0 g/dL   AST 21 15 - 41 U/L   ALT 15 14 - 54 U/L   Alkaline Phosphatase 73 38 - 126 U/L   Total Bilirubin 0.6 0.3 - 1.2 mg/dL   GFR calc non Af Amer 51 (L) >60 mL/min   GFR calc Af Amer 59 (L) >60 mL/min   Anion gap 9 5 - 15  Urinalysis, Routine w reflex microscopic (not at [facility])  Result Value Ref Range   Color, Urine YELLOW YELLOW   APPearance CLEAR CLEAR   Specific Gravity, Urine >1.030 (H) 1.005 - 1.030   pH 5.5 5.0 - 8.0   Glucose, UA NEGATIVE NEGATIVE mg/dL   Hgb urine dipstick NEGATIVE NEGATIVE   Bilirubin Urine NEGATIVE NEGATIVE   Ketones, ur 15 (A) NEGATIVE mg/dL   Protein, ur NEGATIVE NEGATIVE mg/dL   Nitrite NEGATIVE NEGATIVE   Leukocytes, UA NEGATIVE NEGATIVE  Lipase, blood  Result Value Ref Range   Lipase 24 11 - 51 U/L  Troponin I  Result Value Ref Range   Troponin I <0.03 <0.03 ng/mL  Protime-INR  Result Value Ref Range   Prothrombin Time 19.3 (H) 11.4 - 15.2 seconds   INR 1.61   POC occult blood, ED  Result Value Ref Range   Fecal Occult Bld POSITIVE (A) NEGATIVE   Dg Chest 1 View Result Date: 05/10/2016 CLINICAL DATA:  Nausea and vomiting.  Hypertension. EXAM: CHEST 1  VIEW COMPARISON:  April 08, 2016 FINDINGS: No edema or consolidation. Heart is upper normal in size with pulmonary vascularity within normal limits. No adenopathy. There is atherosclerotic calcification in the aorta. No bone lesions. IMPRESSION: Aortic atherosclerosis.  No edema or consolidation. Electronically Signed   By: Lowella Grip III M.D.   On: 05/10/2016 20:12   Ct Abdomen Pelvis W Contrast Result Date: 05/10/2016 CLINICAL DATA:  Nausea, vomiting, diarrhea today. EXAM: CT ABDOMEN AND PELVIS WITH CONTRAST TECHNIQUE: Multidetector CT imaging of the abdomen and pelvis was performed using the standard protocol following bolus administration of intravenous contrast. CONTRAST:  170m ISOVUE-300 IOPAMIDOL (ISOVUE-300) INJECTION 61%, 312m ISOVUE-300 IOPAMIDOL (ISOVUE-300) INJECTION 61% COMPARISON:  04/28/2009.  Lumbar spine MRI 12/22/2012. FINDINGS: Lower chest: Coronary artery calcifications in the visualized left anterior descending coronary artery. Heart is normal size. Lung bases are clear. No effusions. Hepatobiliary: Prior cholecystectomy.  No focal hepatic abnormality. Pancreas: No focal abnormality or ductal dilatation. Spleen: No focal abnormality.  Normal size. Adrenals/Urinary Tract: Multiple cysts within the kidneys bilaterally. No hydronephrosis. Adrenal glands and urinary bladder unremarkable. Stomach/Bowel: Sigmoid diverticulosis. No active diverticulitis. Stomach and small bowel are decompressed, grossly unremarkable. Vascular/Lymphatic: Aortic and iliac calcifications. No aneurysm. No adenopathy. Reproductive: Prior hysterectomy.  No adnexal masses. Other: No free fluid or free air. Musculoskeletal: Mild compression fracture noted at the T12 vertebral body, chronic, stable since prior MRI. IMPRESSION: No acute findings in the abdomen or pelvis. Sigmoid diverticulosis. Coronary artery disease.  Aortoiliac atherosclerosis. Electronically Signed   By: KeRolm Baptise.D.   On: 05/10/2016 20:13    2035:  WBC count elevated; remains afebrile. Copious foul smelling stool while in the ED; heme positive, cdiff and GI pathogen panel pending. Dx and testing d/w pt.  Questions answered.  Verb understanding, agreeable to admit. T/C to [facility]. LeMarin Commentcase discussed, including:  HPI, pertinent PM/SHx, VS/PE, dx testing, ED course and treatment:  Agreeable to admit, requests he will come to the ED for evaluation.    Final Clinical Impressions(s) / ED Diagnoses   Final diagnoses:  None    New Prescriptions New Prescriptions   No medications on file     KaFrancine GravenDO 05/13/16 2131

## 2016-05-10 NOTE — ED Notes (Signed)
Report called to Dept 300, all questions answered  

## 2016-05-10 NOTE — ED Triage Notes (Signed)
Pt c/o n/v/d started today. denies pain. Vomited x 5. Diarrhea "constantly". Denies black or bloody emesis/stool. A/o. Mm moist

## 2016-05-10 NOTE — Telephone Encounter (Signed)
Med sent to pharm. Pt notified.  

## 2016-05-10 NOTE — H&P (Signed)
History and Physical    [name] DUK:[id number] DOB: 1941-05-23 DOA: 05/10/2016  Referring MD/NP/PA: Francine Graven, DO PCP: Mickie Hillier, MD Outpatient Specialists:  Rheumatology: Hennie Duos, MD  Gastroenterology: Rogene Houston, MD  Patient coming from: Home  Chief Complaint: Vomiting and diarrhea  HPI: [name] is a 75 y.o. female with medical history significant of A-fib, Asthma, DM type 2, GERD, HLD, HTN, and hypothyroidism, presents to the ED with complaints of several episodes of nausea, vomiting, and diarrhea that began this morning. Patient states that she was feeling fine yesterday but the diarrhea onset this morning. She denies any recent long use of antibiotics besides her current prescription of amoxicillin. It is noted that she lives by herself. Denies CP and back pain. She also denies blood in her stool. In the ED, all vital signs appear stable. Creatinine 1.03, glucose 145, and WBC 22.1. UA is negative. EKG is unremarkable. FOBT is positive. CT a/p and CXR were negative for any acute issues. Urine cultures are pending. Hospitalist was asked to admit patient for further evaluation.  Review of Systems: As per HPI otherwise 10 point review of systems negative.    Past Medical History:  Diagnosis Date  . Allergy   . Allergy history unknown   . ASCVD (arteriosclerotic cardiovascular disease)    stent to mid and proximal left anterior descending in 06/2002;drug eluting stent placed in the second diagnol in 08/2003 after  A  non-st elevation myocardial infarction   . Asthma   . Atrial fibrillation ([place])   . Chronic anticoagulation   . Chronic nausea   . Chronic pain of left knee   . Coronary artery disease   . Diabetes mellitus    no insulin  . Diarrhea    acute  . FH: colonic polyps    adenomataous  . GERD (gastroesophageal reflux disease)   . Hyperlipidemia    pulmonary embolism 2000 and 09/2008  . Hypertension   . Hypothyroidism   . Insomnia     . Irritable bowel syndrome   . Paroxysmal atrial fibrillation (HCC)    normal LV function; episodes occurred in 205 and 09/2007  . Pedal edema   . Peripheral edema   . Pulmonary embolism ([place])    2000/09/2008  . Rectal bleeding   . Thyroid disease    hypothyroidism  . Tobacco user    stopped   . Venous stasis     Past Surgical History:  Procedure Laterality Date  . ABDOMINAL HYSTERECTOMY    . APPENDECTOMY    . BALLOON DILATION N/A 11/11/2013   Procedure: BALLOON DILATION;  Surgeon: Rogene Houston, MD;  [facility];  Service: Endoscopy;  Laterality: N/A;  . BREAST REDUCTION SURGERY    . BREAST SURGERY    . CHOLECYSTECTOMY    . CLEFT PALATE REPAIR     4 surgeries  . COLONOSCOPY  2011   negative  . COLONOSCOPY N/A 05/10/2014   Procedure: COLONOSCOPY;  Surgeon: Rogene Houston, MD;  [facility];  Service: Endoscopy;  Laterality: N/A;  . ESOPHAGEAL DILATION N/A 03/08/2016   Procedure: ESOPHAGEAL DILATION;  Surgeon: Rogene Houston, MD;  [facility];  Service: Endoscopy;  Laterality: N/A;  . ESOPHAGOGASTRODUODENOSCOPY N/A 11/11/2013   Procedure: ESOPHAGOGASTRODUODENOSCOPY (EGD);  Surgeon: Rogene Houston, MD;  [facility];  Service: Endoscopy;  Laterality: N/A;  200-moved to 100 Ann notified pt  . ESOPHAGOGASTRODUODENOSCOPY N/A 05/09/2014   Procedure:  ESOPHAGOGASTRODUODENOSCOPY (EGD);  Surgeon: Rogene Houston, MD;  [facility];  Service: Endoscopy;  Laterality: N/A;  . ESOPHAGOGASTRODUODENOSCOPY N/A 03/08/2016   Procedure: ESOPHAGOGASTRODUODENOSCOPY (EGD);  Surgeon: Rogene Houston, MD;  [facility];  Service: Endoscopy;  Laterality: N/A;  2:20  . HEEL SPUR SURGERY    . KNEE SURGERY    . MALONEY DILATION N/A 11/11/2013   Procedure: Venia Minks DILATION;  Surgeon: Rogene Houston, MD;  [facility];  Service: Endoscopy;  Laterality: N/A;  . OOPHORECTOMY    . SAVORY DILATION N/A 11/11/2013   Procedure:  SAVORY DILATION;  Surgeon: Rogene Houston, MD;  [facility];  Service: Endoscopy;  Laterality: N/A;     reports that she has quit smoking. Her smoking use included Cigarettes. She started smoking about 29 years ago. She has a 60.00 pack-year smoking history. She has never used smokeless tobacco. She reports that she does not drink alcohol or use drugs.  Allergies  Allergen Reactions  . Demerol Nausea And Vomiting    Family History  Problem Relation Age of Onset  . Heart attack Mother   . Diabetes Mother   . Hyperlipidemia Mother   . Heart attack Father   . Lung cancer Sister   . Lymphoma Brother   . Colon cancer Neg Hx    Prior to Admission medications   Medication Sig Start Date End Date Taking? Authorizing Provider  acetaminophen (TYLENOL) 500 MG tablet Take 500 mg by mouth daily as needed for headache.    Historical Provider, MD  albuterol (PROAIR HFA) 108 (90 Base) MCG/ACT inhaler INHALE 2 PUFFS UP TO FOUR TIMES DAILY AS NEEDED FOR WHEEZING. 07/14/15   Kathyrn Drown, MD  albuterol (PROVENTIL) (2.5 MG/3ML) 0.083% nebulizer solution Take 3 mLs (2.5 mg total) by nebulization every 6 (six) hours as needed for wheezing or shortness of breath. 05/24/15   Kathyrn Drown, MD  amoxicillin (AMOXIL) 500 MG capsule Take 1 capsule (500 mg total) by mouth 3 (three) times daily. 05/10/16   Mikey Kirschner, MD  atorvastatin (LIPITOR) 40 MG tablet Take 1 tablet (40 mg total) by mouth daily. 07/28/15   Herminio Commons, MD  Blood Glucose Monitoring Suppl w/Device KIT Please dispense Glucometer, Strips and all testing supplies per patient's preference or insurance.Test Glucose 1 time daily. Dx: E11.9 04/05/16   Mikey Kirschner, MD  cefPROZIL (CEFZIL) 500 MG tablet Take 1 tablet (500 mg total) by mouth 2 (two) times daily. 05/09/16   Kathyrn Drown, MD  Cholecalciferol (VITAMIN D3 PO) Take 1 tablet by mouth every evening.     Historical Provider, MD  escitalopram (LEXAPRO) 20 MG tablet  Take 1 tablet (20 mg total) by mouth daily. 05/06/16   Mikey Kirschner, MD  fluticasone-salmeterol (ADVAIR HFA) [phone] MCG/ACT inhaler Inhale 2 puffs into the lungs 2 (two) times daily. 08/30/15   Mikey Kirschner, MD  levothyroxine (SYNTHROID, LEVOTHROID) 50 MCG tablet TAKE ONE TABLET BY MOUTH ONCE DAILY. 01/29/16   Mikey Kirschner, MD  oxyCODONE 10 MG TABS Take 1 tablet (10 mg total) by mouth 4 (four) times daily as needed (FOR PAIN). 04/09/16   Donne Hazel, MD  pantoprazole (PROTONIX) 40 MG tablet Take 1 tablet (40 mg total) by mouth 2 (two) times daily before a meal. 02/23/16   Butch Penny, NP  promethazine (PHENERGAN) 12.5 MG tablet Take 1 tablet (12.5 mg total) by mouth every 6 (six) hours as  needed for nausea or vomiting. 02/29/16   Mikey Kirschner, MD  warfarin (COUMADIN) 5 MG tablet Take 5-7.5 mg by mouth daily. 5 mg daily except on  Wednesdays, take 7.66m    Historical Provider, MD    Physical Exam: Vitals:   05/10/16 1812 05/10/16 1814 05/10/16 2017  BP:  159/91 140/82  Pulse:  89 78  Resp:  19 18  Temp:  98.4 F (36.9 C) 98.2 F (36.8 C)  TempSrc:  Oral Oral  SpO2:  94% 100%  Weight: 94.3 kg (208 lb)    Height: 5' 1"  (1.549 m)        Constitutional: NAD, calm, comfortable Vitals:   05/10/16 1812 05/10/16 1814 05/10/16 2017  BP:  159/91 140/82  Pulse:  89 78  Resp:  19 18  Temp:  98.4 F (36.9 C) 98.2 F (36.8 C)  TempSrc:  Oral Oral  SpO2:  94% 100%  Weight: 94.3 kg (208 lb)    Height: 5' 1"  (1.549 m)     Eyes: PERRL, lids and conjunctivae normal ENMT: Mucous membranes are moist. Posterior pharynx clear of any exudate or lesions.Normal dentition.  Neck: normal, supple, no masses, no thyromegaly Respiratory: clear to auscultation bilaterally, no wheezing, no crackles. Normal respiratory effort. No accessory muscle use.  Cardiovascular: Regular rate and rhythm, no murmurs / rubs / gallops. No extremity edema. 2+ pedal pulses. No carotid bruits.  Abdomen: no  tenderness, no masses palpated. No hepatosplenomegaly. Bowel sounds positive.  Musculoskeletal: no clubbing / cyanosis. No joint deformity upper and lower extremities. Good ROM, no contractures. Normal muscle tone.  Skin: no rashes, lesions, ulcers. No induration Neurologic: CN 2-12 grossly intact. Sensation intact, DTR normal. Strength 5/5 in all 4.  Psychiatric: Normal judgment and insight. Alert and oriented x 3. Normal mood.    Labs on Admission: I have personally reviewed following labs and imaging studies  CBC:  Recent Labs Lab 05/10/16 1847  WBC 22.1*  NEUTROABS 19.8*  HGB 13.2  HCT 41.4  MCV 86.6  PLT 3637  Basic Metabolic Panel:  Recent Labs Lab 05/10/16 1847  NA 140  K 4.5  CL 105  CO2 26  GLUCOSE 145*  BUN 16  CREATININE 1.03*  CALCIUM 10.0   GFR: Estimated Creatinine Clearance: 47.9 mL/min (by C-G formula based on SCr of 1.03 mg/dL (H)). Liver Function Tests:  Recent Labs Lab 05/10/16 1847  AST 21  ALT 15  ALKPHOS 73  BILITOT 0.6  PROT 7.7  ALBUMIN 4.0    Recent Labs Lab 05/10/16 1847  LIPASE 24   Coagulation Profile:  Recent Labs Lab 05/10/16 1847  INR 1.61   Cardiac Enzymes:  Recent Labs Lab 05/10/16 1847  TROPONINI <0.03   Urine analysis:    Component Value Date/Time   COLORURINE YELLOW 05/10/2016 1816   APPEARANCEUR CLEAR 05/10/2016 1816   LABSPEC >1.030 (H) 05/10/2016 1816   PHURINE 5.5 05/10/2016 1816   GLUCOSEU NEGATIVE 05/10/2016 1816   HGBUR NEGATIVE 05/10/2016 1816   BILIRUBINUR NEGATIVE 05/10/2016 1816   KETONESUR 15 (A) 05/10/2016 1816   PROTEINUR NEGATIVE 05/10/2016 1816   UROBILINOGEN 0.2 05/08/2014 2240   NITRITE NEGATIVE 05/10/2016 1816   LEUKOCYTESUR NEGATIVE 05/10/2016 1816    Radiological Exams on Admission: Dg Chest 1 View  Result Date: 05/10/2016 CLINICAL DATA:  Nausea and vomiting.  Hypertension. EXAM: CHEST 1 VIEW COMPARISON:  April 08, 2016 FINDINGS: No edema or consolidation. Heart is  upper normal in size with pulmonary vascularity within  normal limits. No adenopathy. There is atherosclerotic calcification in the aorta. No bone lesions. IMPRESSION: Aortic atherosclerosis.  No edema or consolidation. Electronically Signed   By: Lowella Grip III M.D.   On: 05/10/2016 20:12   Ct Abdomen Pelvis W Contrast  Result Date: 05/10/2016 CLINICAL DATA:  Nausea, vomiting, diarrhea today. EXAM: CT ABDOMEN AND PELVIS WITH CONTRAST TECHNIQUE: Multidetector CT imaging of the abdomen and pelvis was performed using the standard protocol following bolus administration of intravenous contrast. CONTRAST:  164m ISOVUE-300 IOPAMIDOL (ISOVUE-300) INJECTION 61%, 370mISOVUE-300 IOPAMIDOL (ISOVUE-300) INJECTION 61% COMPARISON:  04/28/2009.  Lumbar spine MRI 12/22/2012. FINDINGS: Lower chest: Coronary artery calcifications in the visualized left anterior descending coronary artery. Heart is normal size. Lung bases are clear. No effusions. Hepatobiliary: Prior cholecystectomy.  No focal hepatic abnormality. Pancreas: No focal abnormality or ductal dilatation. Spleen: No focal abnormality.  Normal size. Adrenals/Urinary Tract: Multiple cysts within the kidneys bilaterally. No hydronephrosis. Adrenal glands and urinary bladder unremarkable. Stomach/Bowel: Sigmoid diverticulosis. No active diverticulitis. Stomach and small bowel are decompressed, grossly unremarkable. Vascular/Lymphatic: Aortic and iliac calcifications. No aneurysm. No adenopathy. Reproductive: Prior hysterectomy.  No adnexal masses. Other: No free fluid or free air. Musculoskeletal: Mild compression fracture noted at the T12 vertebral body, chronic, stable since prior MRI. IMPRESSION: No acute findings in the abdomen or pelvis. Sigmoid diverticulosis. Coronary artery disease.  Aortoiliac atherosclerosis. Electronically Signed   By: KeRolm Baptise.D.   On: 05/10/2016 20:13    EKG: Independently reviewed. Sinus rhythm  Assessment/Plan Principal  Problem:   [place] Problems:   FIBRILLATION, ATRIAL   DIARRHEA, ACUTE   Volume depletion   1. C.diff. I suspect she has C.diff especially with marked leukocytosis.   Will start oral flagyl while checking stool studies.  All vital signs appear stable. WBC 22.1. Urine culture is pending. Will start her on oral Flagyl and continue to monitor. 2. Pre-renal AKI. Due to volume depletion. Creatinine is slightly elevated. Start on IV fluids. Continue to monitor. 3. A-fib. Chadsvasc score of 7. Rate controlled. Continue anticoagulation with coumadin per pharmacy dosing.  4. DM type 2. Continue SSI.  5. HTN. Continue Diltiazem  6. HLD. Continue Lipitor. 7. GERD. Continue Protonix. 8. Hypothyroidism. Continue synthroid. Check TSH. 9. Asthma. Continue albuterol inhaler.  DVT prophylaxis: Coumadin Code Status: Full Family Communication: No family at bedside Disposition Plan: Discharge once improved Consults called: Gastroenterology Admission status: Admit to observation   PeOrvan FalconerMD   FACP [facility] If 7PM-7AM, please contact night-coverage [email or web address] Password [email or web address]  05/10/2016, 9:05 PM   By signing my name below, I, ChHilbert Odorattest that this documentation has been prepared under the direction and in the presence of PeOrvan FalconerMD. Electronically signed: ChHilbert OdorScribe.  05/10/16, 8:40 PM

## 2016-05-10 NOTE — ED Notes (Signed)
cbg in route 134

## 2016-05-10 NOTE — Progress Notes (Signed)
ANTICOAGULATION CONSULT NOTE - Initial Consult  Pharmacy Consult for Coumadin Indication: atrial fibrillation  Allergies  Allergen Reactions  . Demerol Nausea And Vomiting    Patient Measurements: Height: 5\' 1"  (154.9 cm) Weight: 208 lb (94.3 kg) IBW/kg (Calculated) : 47.8  Vital Signs: Temp: 98.2 F (36.8 C) (11/17 2017) Temp Source: Oral (11/17 2017) BP: 140/82 (11/17 2017) Pulse Rate: 78 (11/17 2017)  Labs:  Recent Labs  05/10/16 1847  HGB 13.2  HCT 41.4  PLT 303  LABPROT 19.3*  INR 1.61  CREATININE 1.03*  TROPONINI <0.03    Estimated Creatinine Clearance: 47.9 mL/min (by C-G formula based on SCr of 1.03 mg/dL (H)).   Medical History: Past Medical History:  Diagnosis Date  . Allergy   . Allergy history unknown   . ASCVD (arteriosclerotic cardiovascular disease)    stent to mid and proximal left anterior descending in 06/2002;drug eluting stent placed in the second diagnol in 08/2003 after  A  non-st elevation myocardial infarction   . Asthma   . Atrial fibrillation ([place])   . Chronic anticoagulation   . Chronic nausea   . Chronic pain of left knee   . Coronary artery disease   . Diabetes mellitus    no insulin  . Diarrhea    acute  . FH: colonic polyps    adenomataous  . GERD (gastroesophageal reflux disease)   . Hyperlipidemia    pulmonary embolism 2000 and 09/2008  . Hypertension   . Hypothyroidism   . Insomnia   . Irritable bowel syndrome   . Paroxysmal atrial fibrillation ([facility])    normal LV function; episodes occurred in 205 and 09/2007  . Pedal edema   . Peripheral edema   . Pulmonary embolism ([place])    2000/09/2008  . Rectal bleeding   . Thyroid disease    hypothyroidism  . Tobacco user    stopped   . Venous stasis     Medications:  See med rec  Assessment: 75 yo female presents to ED with N/V and diarrhea. Patient on chronic coumadin for atrial fibrillation. INR 1.61.  Reviewed labs, continue with coumadin. Home regimen is 5mg   daily except 7.5mg  on Wednesday.  Goal of Therapy:  INR 2-3 Monitor platelets by anticoagulation protocol: Yes   Plan:  Coumadin 5mg  today x 1 PT-INR daily Monitor for S/S of bleeding  Isac Sarna, BS Vena [place], BCPS Clinical Pharmacist Pager [phone]  05/10/2016,9:02 PM

## 2016-05-10 NOTE — Telephone Encounter (Signed)
amox 500 tid ten d 

## 2016-05-11 ENCOUNTER — Encounter (HOSPITAL_COMMUNITY): Payer: Self-pay | Admitting: Internal Medicine

## 2016-05-11 DIAGNOSIS — N182 Chronic kidney disease, stage 2 (mild): Secondary | ICD-10-CM | POA: Diagnosis not present

## 2016-05-11 DIAGNOSIS — I482 Chronic atrial fibrillation: Secondary | ICD-10-CM | POA: Diagnosis not present

## 2016-05-11 DIAGNOSIS — Z7952 Long term (current) use of systemic steroids: Secondary | ICD-10-CM

## 2016-05-11 DIAGNOSIS — R112 Nausea with vomiting, unspecified: Secondary | ICD-10-CM | POA: Diagnosis present

## 2016-05-11 DIAGNOSIS — I10 Essential (primary) hypertension: Secondary | ICD-10-CM

## 2016-05-11 DIAGNOSIS — N179 Acute kidney failure, unspecified: Secondary | ICD-10-CM | POA: Diagnosis not present

## 2016-05-11 DIAGNOSIS — Z7901 Long term (current) use of anticoagulants: Secondary | ICD-10-CM

## 2016-05-11 DIAGNOSIS — R197 Diarrhea, unspecified: Secondary | ICD-10-CM

## 2016-05-11 DIAGNOSIS — E669 Obesity, unspecified: Secondary | ICD-10-CM | POA: Diagnosis present

## 2016-05-11 LAB — TSH: TSH: 0.734 u[IU]/mL (ref 0.350–4.500)

## 2016-05-11 LAB — C DIFFICILE QUICK SCREEN W PCR REFLEX
C DIFFICILE (CDIFF) INTERP: NOT DETECTED
C DIFFICILE (CDIFF) TOXIN: NEGATIVE
C Diff antigen: NEGATIVE

## 2016-05-11 LAB — PROTIME-INR
INR: 1.72
PROTHROMBIN TIME: 20.4 s — AB (ref 11.4–15.2)

## 2016-05-11 MED ORDER — BOOST / RESOURCE BREEZE PO LIQD
1.0000 | Freq: Three times a day (TID) | ORAL | Status: DC
  Administered 2016-05-11 (×2): 1 via ORAL
  Administered 2016-05-11: 20:00:00 via ORAL
  Administered 2016-05-12: 1 via ORAL

## 2016-05-11 MED ORDER — WARFARIN SODIUM 5 MG PO TABS
5.0000 mg | ORAL_TABLET | Freq: Once | ORAL | Status: AC
  Administered 2016-05-11: 5 mg via ORAL
  Filled 2016-05-11: qty 1

## 2016-05-11 MED ORDER — ALBUTEROL SULFATE (2.5 MG/3ML) 0.083% IN NEBU
INHALATION_SOLUTION | RESPIRATORY_TRACT | Status: AC
  Administered 2016-05-11: 2.5 mg via RESPIRATORY_TRACT
  Filled 2016-05-11: qty 3

## 2016-05-11 MED ORDER — DILTIAZEM HCL ER COATED BEADS 180 MG PO CP24
180.0000 mg | ORAL_CAPSULE | Freq: Every day | ORAL | Status: DC
  Administered 2016-05-11 – 2016-05-12 (×2): 180 mg via ORAL
  Filled 2016-05-11 (×2): qty 1

## 2016-05-11 MED ORDER — ALBUTEROL SULFATE (2.5 MG/3ML) 0.083% IN NEBU
2.5000 mg | INHALATION_SOLUTION | Freq: Two times a day (BID) | RESPIRATORY_TRACT | Status: DC
  Administered 2016-05-11 – 2016-05-12 (×3): 2.5 mg via RESPIRATORY_TRACT
  Filled 2016-05-11 (×2): qty 3

## 2016-05-11 NOTE — Progress Notes (Signed)
PROGRESS NOTE    Lar[place] MANIAH [name]  SHF:[id number] DOB: [birth year] DOA: 05/10/2016 PCP: Mickie Hillier, MD    Brief Narrative:  [name] is a 75 y.o. female with medical history significant of A-fib, Asthma, DM type 2, GERD, HLD, HTN, and hypothyroidism, who presented to the ED with complaints of several episodes of nausea, vomiting, and diarrhea. She had been recently started on treatment of sinusitis with Cefzil, but did not tolerate it due to GI symptomatology, and was subsequently prescribed amoxicillin. She had not taken amoxicillin yet. In the ED, she was afebrile and hemodynamically stable. Her stool was guaiac positive. Her lab data were significant for a creatinine of 1.03, glucose 145, and WBC 22.1. UA is negative. EKG was unremarkable. CT of the abdomen and pelvis and CXR were negative for any acute issues. Hospitalist was asked to admit patient for further evaluation.   Assessment & Plan:   Principal Problem:   DIARRHEA, ACUTE Active Problems:   N&V (nausea and vomiting)   AKI (acute kidney injury) ([place])   Hypothyroidism   Controlled type 2 diabetes mellitus ([place])   Essential hypertension   Chronic atrial fibrillation ([place])   Long term current use of anticoagulant therapy   Volume depletion   Current chronic use of systemic steroids   1. Nausea/vomiting/diarrhea. All antibiotics were discontinued as the patient denied any sinusitis symptoms. She was started on IV fluids for hydration. Oral Flagyl was started empirically as there was a high suspicion for C. difficile colitis in the setting of an elevated WBC and recent antibiotic treatment. Antiemetics as needed were started. -C. difficile PCR was ordered and was negative. -Her nausea/vomiting have resolved, but she did have a loose stool this morning. -Will discontinue Flagyl and advance her diet as tolerated. -We'll reassess her leukocytosis on 05/12/16.  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 Patient was  admitted for AKI, but review of her creatinine from the past 3 months, her baseline ranges from 1.09-1.5. The patient's creatinine was 1.03 on admission, so doubt acute kidney injury.  -We'll order follow-up basic metabolic panel on 50/27/74.  Chronic atrial fibrillation on chronic Coumadin. Patient is chronically anticoagulated on Coumadin. She takes diltiazem chronically. -We'll restart diltiazem.  -Her INR is therapeutic. Coumadin dosing ordered per pharmacy.  Hypertension. Patient's blood pressure is trending up, so we'll restart diltiazem.  Type 2 diabetes mellitus. Her diabetes is diet controlled. Her CBGs are modestly elevated, likely from prednisone. She reports not being on prednisone, so will be discontinued. -Her diet advanced to a carbohydrate modified diet. Will re-check her venous glucose on 05/12/16.  Previous prednisone therapy. Patient denies being on active prednisone therapy. Therefore, it will be discontinued.  Hypothyroidism. Patient was continued on Synthroid. Her TSH was within normal limits at 0.7.   DVT prophylaxis: Coumadin Code Status: Full code Family Communication: Family not available Disposition Plan: Discharge to home when clinically appropriate, likely in the next 24-48 hours.   Consultants:   None  Procedures:   None  Antimicrobials:  Metronidazole 11/17-11/18/17    Subjective: Patient reports a small loose stool overnight, but none this morning. She denies nausea or vomiting. She wants to know about advancing her diet to solid foods.  Objective: Vitals:   05/10/16 1814 05/10/16 2017 05/10/16 2300 05/11/16 1036  BP: 159/91 140/82 (!) 147/71   Pulse: 89 78 83   Resp: 19 18 20    Temp: 98.4 F (36.9 C) 98.2 F (36.8 C) 98.3 F (36.8 C)  TempSrc: Oral Oral Oral   SpO2: 94% 100% 96% 98%  Weight:   93.2 kg (205 lb 7.5 oz)   Height:        Intake/Output Summary (Last 24 hours) at 05/11/16 1102 Last data filed at 05/11/16 1015   Gross per 24 hour  Intake           658.33 ml  Output                0 ml  Net           658.33 ml   Filed Weights   05/10/16 1812 05/10/16 2300  Weight: 94.3 kg (208 lb) 93.2 kg (205 lb 7.5 oz)    Examination:  General exam: Appears calm and comfortable  Respiratory system: Clear to auscultation. Respiratory effort normal. Cardiovascular system: S1 & S2 heard, RRR. No JVD, murmurs, rubs, gallops or clicks. No pedal edema. Gastrointestinal system: Abdomen is obese, nondistended, soft and nontender. No organomegaly or masses felt. Normal bowel sounds heard. Central nervous system: Alert and oriented. No focal neurological deficits. Extremities: Symmetric 5 x 5 power. Skin: No rashes, lesions or ulcers Psychiatry: Judgement and insight appear normal. Mood & affect appropriate.     Data Reviewed: I have personally reviewed following labs and imaging studies  CBC:  Recent Labs Lab 05/10/16 1847  WBC 22.1*  NEUTROABS 19.8*  HGB 13.2  HCT 41.4  MCV 86.6  PLT 194   Basic Metabolic Panel:  Recent Labs Lab 05/10/16 1847  NA 140  K 4.5  CL 105  CO2 26  GLUCOSE 145*  BUN 16  CREATININE 1.03*  CALCIUM 10.0   GFR: Estimated Creatinine Clearance: 47.7 mL/min (by C-G formula based on SCr of 1.03 mg/dL (H)). Liver Function Tests:  Recent Labs Lab 05/10/16 1847  AST 21  ALT 15  ALKPHOS 73  BILITOT 0.6  PROT 7.7  ALBUMIN 4.0    Recent Labs Lab 05/10/16 1847  LIPASE 24   No results for input(s): AMMONIA in the last 168 hours. Coagulation Profile:  Recent Labs Lab 05/10/16 1847 05/11/16 0743  INR 1.61 1.72   Cardiac Enzymes:  Recent Labs Lab 05/10/16 1847  TROPONINI <0.03   BNP (last 3 results) No results for input(s): PROBNP in the last 8760 hours. HbA1C: No results for input(s): HGBA1C in the last 72 hours. CBG: No results for input(s): GLUCAP in the last 168 hours. Lipid Profile: No results for input(s): CHOL, HDL, LDLCALC, TRIG, CHOLHDL,  LDLDIRECT in the last 72 hours. Thyroid Function Tests:  Recent Labs  05/10/16 1847  TSH 0.734   Anemia Panel: No results for input(s): VITAMINB12, FOLATE, FERRITIN, TIBC, IRON, RETICCTPCT in the last 72 hours. Sepsis Labs:  Recent Labs Lab 05/10/16 1847 05/10/16 2125  LATICACIDVEN 1.3 1.0    No results found for this or any previous visit (from the past 240 hour(s)).       Radiology Studies: Dg Chest 1 View  Result Date: 05/10/2016 CLINICAL DATA:  Nausea and vomiting.  Hypertension. EXAM: CHEST 1 VIEW COMPARISON:  April 08, 2016 FINDINGS: No edema or consolidation. Heart is upper normal in size with pulmonary vascularity within normal limits. No adenopathy. There is atherosclerotic calcification in the aorta. No bone lesions. IMPRESSION: Aortic atherosclerosis.  No edema or consolidation. Electronically Signed   By: Lowella Grip III M.D.   On: 05/10/2016 20:12   Ct Abdomen Pelvis W Contrast  Result Date: 05/10/2016 CLINICAL DATA:  Nausea, vomiting, diarrhea today. EXAM:  CT ABDOMEN AND PELVIS WITH CONTRAST TECHNIQUE: Multidetector CT imaging of the abdomen and pelvis was performed using the standard protocol following bolus administration of intravenous contrast. CONTRAST:  127mL ISOVUE-300 IOPAMIDOL (ISOVUE-300) INJECTION 61%, 71mL ISOVUE-300 IOPAMIDOL (ISOVUE-300) INJECTION 61% COMPARISON:  04/28/2009.  Lumbar spine MRI 12/22/2012. FINDINGS: Lower chest: Coronary artery calcifications in the visualized left anterior descending coronary artery. Heart is normal size. Lung bases are clear. No effusions. Hepatobiliary: Prior cholecystectomy.  No focal hepatic abnormality. Pancreas: No focal abnormality or ductal dilatation. Spleen: No focal abnormality.  Normal size. Adrenals/Urinary Tract: Multiple cysts within the kidneys bilaterally. No hydronephrosis. Adrenal glands and urinary bladder unremarkable. Stomach/Bowel: Sigmoid diverticulosis. No active diverticulitis. Stomach  and small bowel are decompressed, grossly unremarkable. Vascular/Lymphatic: Aortic and iliac calcifications. No aneurysm. No adenopathy. Reproductive: Prior hysterectomy.  No adnexal masses. Other: No free fluid or free air. Musculoskeletal: Mild compression fracture noted at the T12 vertebral body, chronic, stable since prior MRI. IMPRESSION: No acute findings in the abdomen or pelvis. Sigmoid diverticulosis. Coronary artery disease.  Aortoiliac atherosclerosis. Electronically Signed   By: Rolm Baptise M.D.   On: 05/10/2016 20:13        Scheduled Meds: . atorvastatin  40 mg Oral q1800  . escitalopram  20 mg Oral Daily  . feeding supplement  1 Container Oral TID BM  . levothyroxine  50 mcg Oral QAC breakfast  . metroNIDAZOLE  500 mg Oral Q8H  . mometasone-formoterol  2 puff Inhalation BID  . [START ON 05/15/2016] predniSONE  7.5 mg Oral Once per day on Wed   And  . predniSONE  5 mg Oral Once per day on Sun Mon Tue Thu Fri Sat  . Warfarin - Pharmacist Dosing Inpatient   Does not apply q1800   Continuous Infusions: . dextrose 5 % and 0.9 % NaCl with KCl 20 mEq/L 100 mL/hr at 05/10/16 2249     LOS: 0 days    Time spent: 30 minutes    Rexene Alberts, MD [facility] Pager [phone]  If 7PM-7AM, please contact night-coverage [email or web address] Password [email or web address] 05/11/2016, 11:02 AM

## 2016-05-11 NOTE — Progress Notes (Signed)
ANTICOAGULATION CONSULT NOTE - Initial Consult  Pharmacy Consult for Coumadin Indication: atrial fibrillation  Allergies  Allergen Reactions  . Demerol Nausea And Vomiting    Patient Measurements: Height: 5\' 1"  (154.9 cm) Weight: 205 lb 7.5 oz (93.2 kg) IBW/kg (Calculated) : 47.8  Vital Signs:    Labs:  Recent Labs  05/10/16 1847 05/11/16 0743  HGB 13.2  --   HCT 41.4  --   PLT 303  --   LABPROT 19.3* 20.4*  INR 1.61 1.72  CREATININE 1.03*  --   TROPONINI <0.03  --     Estimated Creatinine Clearance: 47.7 mL/min (by C-G formula based on SCr of 1.03 mg/dL (H)).   Medical History: Past Medical History:  Diagnosis Date  . Allergy   . Allergy history unknown   . Arthritis   . ASCVD (arteriosclerotic cardiovascular disease)    stent to mid and proximal left anterior descending in 06/2002;drug eluting stent placed in the second diagnol in 08/2003 after  A  non-st elevation myocardial infarction   . Asthma   . Atrial fibrillation ([place])   . Chronic anticoagulation   . Chronic nausea   . Chronic pain of left knee   . Coronary artery disease   . Current chronic use of systemic steroids 05/11/2016  . Depression   . Diabetes mellitus    no insulin  . Diarrhea    acute  . FH: colonic polyps    adenomataous  . GERD (gastroesophageal reflux disease)   . History of kidney stones   . Hyperlipidemia    pulmonary embolism 2000 and 09/2008  . Hypertension   . Hypothyroidism   . Insomnia   . Irritable bowel syndrome   . Myocardial infarction   . Paroxysmal atrial fibrillation ([facility])    normal LV function; episodes occurred in 205 and 09/2007  . Pedal edema   . Peripheral edema   . PONV (postoperative nausea and vomiting)   . Pulmonary embolism ([place])    2000/09/2008  . Rectal bleeding   . Sleep apnea   . Thyroid disease    hypothyroidism  . Tobacco user    stopped   . Venous stasis     Medications:  See med rec  Assessment: 75 yo female presents to ED with  N/V and diarrhea. Patient on chronic coumadin for atrial fibrillation. INR 1.61.  Reviewed labs, continue with coumadin. Home regimen is 5mg  daily except 7.5mg  on Wednesday. Slight increase, suspect metronidazole may increase INR.   Goal of Therapy:  INR 2-3 Monitor platelets by anticoagulation protocol: Yes   Plan:  Coumadin 5mg  today x 1 PT-INR daily Monitor for S/S of bleeding  Isac Sarna, BS [place], BCPS Clinical Pharmacist Pager [phone]  05/11/2016,12:47 PM

## 2016-05-12 DIAGNOSIS — N182 Chronic kidney disease, stage 2 (mild): Secondary | ICD-10-CM | POA: Diagnosis not present

## 2016-05-12 DIAGNOSIS — R112 Nausea with vomiting, unspecified: Secondary | ICD-10-CM | POA: Diagnosis not present

## 2016-05-12 DIAGNOSIS — Z7901 Long term (current) use of anticoagulants: Secondary | ICD-10-CM | POA: Diagnosis not present

## 2016-05-12 DIAGNOSIS — R197 Diarrhea, unspecified: Secondary | ICD-10-CM | POA: Diagnosis not present

## 2016-05-12 DIAGNOSIS — I482 Chronic atrial fibrillation: Secondary | ICD-10-CM | POA: Diagnosis not present

## 2016-05-12 DIAGNOSIS — N179 Acute kidney failure, unspecified: Secondary | ICD-10-CM

## 2016-05-12 DIAGNOSIS — J45998 Other asthma: Secondary | ICD-10-CM | POA: Diagnosis not present

## 2016-05-12 LAB — GASTROINTESTINAL PANEL BY PCR, STOOL (REPLACES STOOL CULTURE)
ASTROVIRUS: NOT DETECTED
Adenovirus F40/41: NOT DETECTED
CAMPYLOBACTER SPECIES: NOT DETECTED
Cryptosporidium: NOT DETECTED
Cyclospora cayetanensis: NOT DETECTED
E. COLI O157: NOT DETECTED
ENTEROTOXIGENIC E COLI (ETEC): NOT DETECTED
Entamoeba histolytica: NOT DETECTED
Enteroaggregative E coli (EAEC): NOT DETECTED
Enteropathogenic E coli (EPEC): NOT DETECTED
Giardia lamblia: NOT DETECTED
NOROVIRUS GI/GII: NOT DETECTED
PLESIMONAS SHIGELLOIDES: NOT DETECTED
Rotavirus A: NOT DETECTED
SALMONELLA SPECIES: NOT DETECTED
SAPOVIRUS (I, II, IV, AND V): NOT DETECTED
SHIGA LIKE TOXIN PRODUCING E COLI (STEC): NOT DETECTED
SHIGELLA/ENTEROINVASIVE E COLI (EIEC): NOT DETECTED
VIBRIO CHOLERAE: NOT DETECTED
Vibrio species: NOT DETECTED
Yersinia enterocolitica: NOT DETECTED

## 2016-05-12 LAB — CBC
HCT: 34.4 % — ABNORMAL LOW (ref 36.0–46.0)
Hemoglobin: 10.7 g/dL — ABNORMAL LOW (ref 12.0–15.0)
MCH: 27.4 pg (ref 26.0–34.0)
MCHC: 31.1 g/dL (ref 30.0–36.0)
MCV: 88.2 fL (ref 78.0–100.0)
Platelets: 267 10*3/uL (ref 150–400)
RBC: 3.9 MIL/uL (ref 3.87–5.11)
RDW: 15.4 % (ref 11.5–15.5)
WBC: 8.8 10*3/uL (ref 4.0–10.5)

## 2016-05-12 LAB — BASIC METABOLIC PANEL
Anion gap: 4 — ABNORMAL LOW (ref 5–15)
BUN: 15 mg/dL (ref 6–20)
CALCIUM: 8.6 mg/dL — AB (ref 8.9–10.3)
CO2: 27 mmol/L (ref 22–32)
Chloride: 109 mmol/L (ref 101–111)
Creatinine, Ser: 0.97 mg/dL (ref 0.44–1.00)
GFR calc Af Amer: >60 mL/min (ref >60)
GFR, EST NON AFRICAN AMERICAN: 55 mL/min — AB (ref >60)
GLUCOSE: 89 mg/dL (ref 65–99)
Potassium: 4.1 mmol/L (ref 3.5–5.1)
Sodium: 140 mmol/L (ref 135–145)

## 2016-05-12 LAB — URINE CULTURE: Culture: NO GROWTH

## 2016-05-12 LAB — PROTIME-INR
INR: 1.86
PROTHROMBIN TIME: 21.7 s — AB (ref 11.4–15.2)

## 2016-05-12 MED ORDER — WARFARIN SODIUM 5 MG PO TABS: 5.0000 mg | ORAL_TABLET | Freq: Once | ORAL | Status: DC

## 2016-05-12 NOTE — Progress Notes (Addendum)
[facility] for Coumadin Indication: atrial fibrillation  Allergies  Allergen Reactions  . Demerol Nausea And Vomiting    Patient Measurements: Height: 5\' 1"  (154.9 cm) Weight: 205 lb 7.5 oz (93.2 kg) IBW/kg (Calculated) : 47.8  Vital Signs: Temp: 97.9 F (36.6 C) (11/19 0527) Temp Source: Oral (11/19 0527) BP: 127/60 (11/19 0527) Pulse Rate: 65 (11/19 0527)  Labs:  Recent Labs  05/10/16 1847 05/11/16 0743 05/12/16 0639  HGB 13.2  --  10.7*  HCT 41.4  --  34.4*  PLT 303  --  267  LABPROT 19.3* 20.4* 21.7*  INR 1.61 1.72 1.86  CREATININE 1.03*  --  0.97  TROPONINI <0.03  --   --     Estimated Creatinine Clearance: 50.6 mL/min (by C-G formula based on SCr of 0.97 mg/dL).   Medical History: Past Medical History:  Diagnosis Date  . Allergy   . Allergy history unknown   . Arthritis   . ASCVD (arteriosclerotic cardiovascular disease)    stent to mid and proximal left anterior descending in 06/2002;drug eluting stent placed in the second diagnol in 08/2003 after  A  non-st elevation myocardial infarction   . Asthma   . Atrial fibrillation ([place])   . Chronic anticoagulation   . Chronic nausea   . Chronic pain of left knee   . Coronary artery disease   . Current chronic use of systemic steroids 05/11/2016  . Depression   . Diabetes mellitus    no insulin  . Diarrhea    acute  . FH: colonic polyps    adenomataous  . GERD (gastroesophageal reflux disease)   . History of kidney stones   . Hyperlipidemia    pulmonary embolism 2000 and 09/2008  . Hypertension   . Hypothyroidism   . Insomnia   . Irritable bowel syndrome   . Myocardial infarction   . Paroxysmal atrial fibrillation ([facility])    normal LV function; episodes occurred in 205 and 09/2007  . Pedal edema   . Peripheral edema   . PONV (postoperative nausea and vomiting)   . Pulmonary embolism ([place])    2000/09/2008  . Rectal bleeding   . Sleep apnea   . Thyroid disease     hypothyroidism  . Tobacco user    stopped   . Venous stasis     Medications:  See med rec  Assessment: 75 yo female presents to ED with N/V and diarrhea. Patient on chronic coumadin for atrial fibrillation. INR 1.61.  Reviewed labs, continue with coumadin. Home regimen is 5mg  daily except 7.5mg  on Wednesday. INR continues to increase.   Goal of Therapy:  INR 2-3 Monitor platelets by anticoagulation protocol: Yes   Plan:  Coumadin 5mg  today x 1 PT-INR daily Monitor for S/S of bleeding  Isac Sarna, BS [place], BCPS Clinical Pharmacist Pager [phone]  05/12/2016,10:57 AM

## 2016-05-12 NOTE — Progress Notes (Signed)
D/C instructions and paperwork given to patient. IV catheter removed from patient's RIGHT FA intact w/ no s/s of infection/infiltration noted at this time. Assisted patient with getting dressed and gathered her belongings. Patient currently awaiting her friend to arrive to [facility] to transport her home.

## 2016-05-12 NOTE — Discharge Summary (Signed)
Physician Discharge Summary  [name] VPX:[id number] DOB: March 23, 1941 DOA: 05/10/2016  PCP: [name] Hillier, MD  Admit date: 05/10/2016 Discharge date: 05/12/2016  Time spent: Greater than 30 minutes  Recommendations for Outpatient Follow-up:  1.  Patient was instructed to follow-up with her PCP in one week or less.   Discharge Diagnoses:  1. Nausea, vomiting, and diarrhea, secondary to Cefzil. 2. 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 3. Chest congestion and a patient with chronic asthma. 4. Controlled type 2 diabetes mellitus. 5. Essential hypertension. 6. Chronic atrial fibrillation on chronic Coumadin. 7. Hypothyroidism. 8. Obesity. 9. Normocytic anemia with Hemoccult positive stool; no evidence of acute GI bleeding. -Anemia was thought to be secondary to hemodilution, but GI evaluation is recommended. -Patient stated that she has an appointment to follow-up with her gastroenterologist, [name] soon.  Discharge Condition: Improved  Diet recommendation: Heart healthy  Filed Weights   05/10/16 1812 05/10/16 2300  Weight: 94.3 kg (208 lb) 93.2 kg (205 lb 7.5 oz)    History of present illness:  [name] P Purcellis a 75 y.o.femalewith medical history significant of A-fib, Asthma, DM type 2, GERD, HLD, HTN, and hypothyroidism, who presented to the ED with complaints of several episodes of nausea, vomiting, and diarrhea. She had been recently started on treatment of sinusitis with Cefzil, but did not tolerate it due to GI symptomatology. She was subsequently prescribed amoxicillin. She had not taken amoxicillin yet. In the ED, she was afebrile and hemodynamically stable. Her stool was guaiac positive. Her lab data were significant for a creatinine of 1.03, glucose 145, and WBC 22.1. UA is negative. EKG was unremarkable. CT of the abdomen and pelvis and CXR were negative for any acute issues. Hospitalist  was asked to admit patient for further evaluation.   Hospital Course:  1. Nausea/vomiting/diarrhea. All antibiotics were discontinued as the patient denied any sinusitis symptoms. She was started on IV fluids for hydration. Oral Flagyl was started empirically as there was a high suspicion for C. difficile colitis in the setting of an elevated WBC and recent antibiotic treatment. Antiemetics as needed were started. -C. difficile PCR was ordered and was negative. Flagyl was discontinued. -Her nausea/vomiting have resolved. She only had one loose stool during the hospitalization. -Her diet was advanced which she tolerated well. -The likely etiology of her GI symptoms was from the known GI side effects of Cefzil.  Leukocytosis. Patient's white blood cell count was 22.1 on admission. As above, she was treated empirically with Flagyl and supportive treatment. She remained afebrile. Her white blood cell count normalized.  Chest congestion and a patient with chronic asthma. Patient was initially started on prednisone, but it was discontinued as she stated that she was no longer on prednisone. She had some mild chest congestion on exam, but her chest x-ray revealed no acute disease. She was restarted on her chronic inhalers. Although she was afebrile, she wanted to start taking amoxicillin as she had taken it before without any side effects. She was prescribed prior to his hospitalization for treatment of sinusitis per her account. Her white blood cell normalized and she was afebrile during the hospitalization.  Mild AKI secondary to prerenal azotemia, superimposed on probable CKD, stage II Patient was admitted for AKI, but from the  review of her creatinine from the past 3 months, her baseline ranges from 1.09-1.5. The patient's creatinine was 1.03 on admission. With hydration, it improved to 0.97.   Chronic  atrial fibrillation on chronic Coumadin. Patient is chronically anticoagulated on Coumadin.  She also takes diltiazem chronically. Both medications were restarted.  -Her INR was slightly supple therapeutic. Coumadin dosing was ordered per pharmacy. -She was discharged on her home regimen due to the patient wanting to restart the amoxicillin as increasing the dose may lead to an elevated INR beyond the therapeutic range. -Her INR was 1.86 at the time of discharge. Further management will be deferred to her PCP.  Hypertension. Diltiazem was restarted. Her blood pressure remained stable.  Normocytic anemia. The patient's hemoglobin was 13.2 on admission, likely from hemoconcentration. With IV fluids, her hemoglobin fell to 10.7. On admission, her stool was found to be Hemoccult positive, but she had no black tarry stools or no bright red blood per rectum. She is followed by gastroenterologist [name]. She has an appointment with him this week. Will defer further management and diagnostic studies to him.  Type 2 diabetes mellitus. Her diabetes is diet controlled. Her CBGs were modestly elevated, likely from prednisone. When prednisone was discontinued, her venous glucose normalized.  Hypothyroidism. Patient was continued on Synthroid. Her TSH was within normal limits at 0.7.  Procedures:  None  Consultations:  None  Discharge Exam: Vitals:   05/11/16 2152 05/12/16 0527  BP: (!) 141/77 127/60  Pulse: 88 65  Resp: 16 18  Temp: 99.1 F (37.3 C) 97.9 F (36.6 C)    General: Pleasant alert 75 year old woman who looks better. No acute distress. Cardiovascular: S1, S2, with a soft systolic murmur. Respiratory: Occasional crackles in the upper lobes, cleared with coughing. Breathing nonlabored. Abdomen: Positive bowel sounds, soft, nontender, nondistended.  Discharge Instructions   Discharge Instructions    Diet - low sodium heart healthy    Complete by:  As directed    Increase activity slowly    Complete by:  As directed      Current Discharge Medication List     CONTINUE these medications which have NOT CHANGED   Details  acetaminophen (TYLENOL) 500 MG tablet Take 500 mg by mouth daily as needed for headache.    albuterol (PROAIR HFA) 108 (90 Base) MCG/ACT inhaler INHALE 2 PUFFS UP TO FOUR TIMES DAILY AS NEEDED FOR WHEEZING. Qty: 8.5 g, Refills: 2    albuterol (PROVENTIL) (2.5 MG/3ML) 0.083% nebulizer solution Take 3 mLs (2.5 mg total) by nebulization every 6 (six) hours as needed for wheezing or shortness of breath. Qty: 75 mL, Refills: 5    atorvastatin (LIPITOR) 40 MG tablet Take 1 tablet (40 mg total) by mouth daily. Qty: 90 tablet, Refills: 3    Cholecalciferol (VITAMIN D3 PO) Take 1 tablet by mouth every evening.     diltiazem (CARDIZEM CD) 180 MG 24 hr capsule Take 180 mg by mouth daily.    escitalopram (LEXAPRO) 20 MG tablet Take 1 tablet (20 mg total) by mouth daily. Qty: 30 tablet, Refills: 5    fluticasone-salmeterol (ADVAIR HFA) 230-21 MCG/ACT inhaler Inhale 2 puffs into the lungs 2 (two) times daily. Qty: 1 Inhaler, Refills: 12    levothyroxine (SYNTHROID, LEVOTHROID) 50 MCG tablet TAKE ONE TABLET BY MOUTH ONCE DAILY. Qty: 30 tablet, Refills: 5    oxyCODONE 10 MG TABS Take 1 tablet (10 mg total) by mouth 4 (four) times daily as needed (FOR PAIN). Qty: 1 tablet, Refills: 0    pantoprazole (PROTONIX) 40 MG tablet Take 1 tablet (40 mg total) by mouth 2 (two) times daily before a meal. Qty: 60 tablet, Refills: 3  Associated Diagnoses: Gastroesophageal reflux disease without esophagitis    promethazine (PHENERGAN) 12.5 MG tablet Take 1 tablet (12.5 mg total) by mouth every 6 (six) hours as needed for nausea or vomiting. Qty: 12 tablet, Refills: 5    warfarin (COUMADIN) 5 MG tablet Take 5-7.5 mg by mouth daily. 5 mg daily except on  Wednesdays, take 7.42m    amoxicillin (AMOXIL) 500 MG capsule Take 1 capsule (500 mg total) by mouth 3 (three) times daily. Qty: 30 capsule, Refills: 0    Blood Glucose Monitoring Suppl  w/Device KIT Please dispense Glucometer, Strips and all testing supplies per patient's preference or insurance.Test Glucose 1 time daily. Dx: E11.9 Qty: 50 each, Refills: 5      STOP taking these medications     cefPROZIL (CEFZIL) 500 MG tablet      predniSONE (DELTASONE) 5 MG tablet        Allergies  Allergen Reactions  . Demerol Nausea And Vomiting   Follow-up Information    SMickie Hillier MD. Schedule an appointment as soon as possible for a visit today.   Specialty:  Family Medicine Why:  To follow-up later in the week in 3-5 days to check your INR and to follow-up on your possible infection. Contact information: [phone]           The results of significant diagnostics from this hospitalization (including imaging, microbiology, ancillary and laboratory) are listed below for reference.    Significant Diagnostic Studies: Dg Chest 1 View  Result Date: 05/10/2016 CLINICAL DATA:  Nausea and vomiting.  Hypertension. EXAM: CHEST 1 VIEW COMPARISON:  April 08, 2016 FINDINGS: No edema or consolidation. Heart is upper normal in size with pulmonary vascularity within normal limits. No adenopathy. There is atherosclerotic calcification in the aorta. No bone lesions. IMPRESSION: Aortic atherosclerosis.  No edema or consolidation. Electronically Signed   By: WLowella GripIII M.D.   On: 05/10/2016 20:12   Ct Abdomen Pelvis W Contrast  Result Date: 05/10/2016 CLINICAL DATA:  Nausea, vomiting, diarrhea today. EXAM: CT ABDOMEN AND PELVIS WITH CONTRAST TECHNIQUE: Multidetector CT imaging of the abdomen and pelvis was performed using the standard protocol following bolus administration of intravenous contrast. CONTRAST:  1074mISOVUE-300 IOPAMIDOL (ISOVUE-300) INJECTION 61%, 3061mSOVUE-300 IOPAMIDOL (ISOVUE-300) INJECTION 61% COMPARISON:  04/28/2009.  Lumbar spine MRI 12/22/2012. FINDINGS: Lower chest: Coronary artery calcifications  in the visualized left anterior descending coronary artery. Heart is normal size. Lung bases are clear. No effusions. Hepatobiliary: Prior cholecystectomy.  No focal hepatic abnormality. Pancreas: No focal abnormality or ductal dilatation. Spleen: No focal abnormality.  Normal size. Adrenals/Urinary Tract: Multiple cysts within the kidneys bilaterally. No hydronephrosis. Adrenal glands and urinary bladder unremarkable. Stomach/Bowel: Sigmoid diverticulosis. No active diverticulitis. Stomach and small bowel are decompressed, grossly unremarkable. Vascular/Lymphatic: Aortic and iliac calcifications. No aneurysm. No adenopathy. Reproductive: Prior hysterectomy.  No adnexal masses. Other: No free fluid or free air. Musculoskeletal: Mild compression fracture noted at the T12 vertebral body, chronic, stable since prior MRI. IMPRESSION: No acute findings in the abdomen or pelvis. Sigmoid diverticulosis. Coronary artery disease.  Aortoiliac atherosclerosis. Electronically Signed   By: KevRolm BaptiseD.   On: 05/10/2016 20:13    Microbiology: Recent Results (from the past 240 hour(s))  Urine culture     Status: None   Collection Time: 05/10/16  6:18 PM  Result Value Ref Range Status   Specimen Description URINE, CATHETERIZED  Final   Special Requests NONE  Final   Culture NO GROWTH Performed at [facility]   Final   Report Status 05/12/2016 FINAL  Final  C difficile quick scan w PCR reflex     Status: None   Collection Time: 05/11/16  8:15 AM  Result Value Ref Range Status   C Diff antigen NEGATIVE NEGATIVE Final   C Diff toxin NEGATIVE NEGATIVE Final   C Diff interpretation No C. difficile detected.  Final     Labs: Basic Metabolic Panel:  Recent Labs Lab 05/10/16 1847 05/12/16 0639  NA 140 140  K 4.5 4.1  CL 105 109  CO2 26 27  GLUCOSE 145* 89  BUN 16 15  CREATININE 1.03* 0.97  CALCIUM 10.0 8.6*   Liver Function Tests:  Recent Labs Lab 05/10/16 1847  AST 21  ALT 15   ALKPHOS 73  BILITOT 0.6  PROT 7.7  ALBUMIN 4.0    Recent Labs Lab 05/10/16 1847  LIPASE 24   No results for input(s): AMMONIA in the last 168 hours. CBC:  Recent Labs Lab 05/10/16 1847 05/12/16 0639  WBC 22.1* 8.8  NEUTROABS 19.8*  --   HGB 13.2 10.7*  HCT 41.4 34.4*  MCV 86.6 88.2  PLT 303 267   Cardiac Enzymes:  Recent Labs Lab 05/10/16 1847  TROPONINI <0.03   BNP: BNP (last 3 results)  Recent Labs  04/08/16 1734  BNP 216.0*    ProBNP (last 3 results) No results for input(s): PROBNP in the last 8760 hours.  CBG: No results for input(s): GLUCAP in the last 168 hours.     Signed:  Opal Mckellips MD.  [facility] 05/12/2016, 11:46 AM

## 2016-05-13 ENCOUNTER — Other Ambulatory Visit (INDEPENDENT_AMBULATORY_CARE_PROVIDER_SITE_OTHER): Payer: Self-pay | Admitting: Internal Medicine

## 2016-05-13 ENCOUNTER — Other Ambulatory Visit: Payer: Self-pay | Admitting: Family Medicine

## 2016-05-13 DIAGNOSIS — E785 Hyperlipidemia, unspecified: Secondary | ICD-10-CM | POA: Diagnosis not present

## 2016-05-13 DIAGNOSIS — J45909 Unspecified asthma, uncomplicated: Secondary | ICD-10-CM | POA: Diagnosis not present

## 2016-05-13 DIAGNOSIS — Z7901 Long term (current) use of anticoagulants: Secondary | ICD-10-CM | POA: Diagnosis not present

## 2016-05-13 DIAGNOSIS — Z7951 Long term (current) use of inhaled steroids: Secondary | ICD-10-CM | POA: Diagnosis not present

## 2016-05-13 DIAGNOSIS — Z87891 Personal history of nicotine dependence: Secondary | ICD-10-CM | POA: Diagnosis not present

## 2016-05-13 DIAGNOSIS — Z7952 Long term (current) use of systemic steroids: Secondary | ICD-10-CM | POA: Diagnosis not present

## 2016-05-13 DIAGNOSIS — N179 Acute kidney failure, unspecified: Secondary | ICD-10-CM | POA: Diagnosis not present

## 2016-05-13 DIAGNOSIS — E119 Type 2 diabetes mellitus without complications: Secondary | ICD-10-CM | POA: Diagnosis not present

## 2016-05-13 DIAGNOSIS — I10 Essential (primary) hypertension: Secondary | ICD-10-CM | POA: Diagnosis not present

## 2016-05-13 DIAGNOSIS — I482 Chronic atrial fibrillation: Secondary | ICD-10-CM | POA: Diagnosis not present

## 2016-05-13 NOTE — Telephone Encounter (Signed)
Ok times one 

## 2016-05-14 ENCOUNTER — Encounter (HOSPITAL_COMMUNITY): Payer: Medicare HMO

## 2016-05-14 DIAGNOSIS — Z7952 Long term (current) use of systemic steroids: Secondary | ICD-10-CM | POA: Diagnosis not present

## 2016-05-14 DIAGNOSIS — E119 Type 2 diabetes mellitus without complications: Secondary | ICD-10-CM | POA: Diagnosis not present

## 2016-05-14 DIAGNOSIS — I10 Essential (primary) hypertension: Secondary | ICD-10-CM | POA: Diagnosis not present

## 2016-05-14 DIAGNOSIS — E785 Hyperlipidemia, unspecified: Secondary | ICD-10-CM | POA: Diagnosis not present

## 2016-05-14 DIAGNOSIS — Z87891 Personal history of nicotine dependence: Secondary | ICD-10-CM | POA: Diagnosis not present

## 2016-05-14 DIAGNOSIS — Z7951 Long term (current) use of inhaled steroids: Secondary | ICD-10-CM | POA: Diagnosis not present

## 2016-05-14 DIAGNOSIS — I482 Chronic atrial fibrillation: Secondary | ICD-10-CM | POA: Diagnosis not present

## 2016-05-14 DIAGNOSIS — Z7901 Long term (current) use of anticoagulants: Secondary | ICD-10-CM | POA: Diagnosis not present

## 2016-05-14 DIAGNOSIS — N179 Acute kidney failure, unspecified: Secondary | ICD-10-CM | POA: Diagnosis not present

## 2016-05-14 DIAGNOSIS — J45909 Unspecified asthma, uncomplicated: Secondary | ICD-10-CM | POA: Diagnosis not present

## 2016-05-15 ENCOUNTER — Ambulatory Visit (INDEPENDENT_AMBULATORY_CARE_PROVIDER_SITE_OTHER): Payer: Medicare HMO | Admitting: Family Medicine

## 2016-05-15 ENCOUNTER — Ambulatory Visit: Payer: Medicare HMO

## 2016-05-15 DIAGNOSIS — I482 Chronic atrial fibrillation: Secondary | ICD-10-CM | POA: Diagnosis not present

## 2016-05-15 DIAGNOSIS — R05 Cough: Secondary | ICD-10-CM | POA: Diagnosis not present

## 2016-05-15 DIAGNOSIS — J4520 Mild intermittent asthma, uncomplicated: Secondary | ICD-10-CM | POA: Diagnosis not present

## 2016-05-15 DIAGNOSIS — R059 Cough, unspecified: Secondary | ICD-10-CM

## 2016-05-15 DIAGNOSIS — Z7901 Long term (current) use of anticoagulants: Secondary | ICD-10-CM | POA: Diagnosis not present

## 2016-05-15 LAB — POCT INR: INR: 2

## 2016-05-15 MED ORDER — METHYLPREDNISOLONE ACETATE 40 MG/ML IJ SUSP
40.0000 mg | Freq: Once | INTRAMUSCULAR | Status: AC
  Administered 2016-05-15: 40 mg via INTRAMUSCULAR

## 2016-05-15 MED ORDER — PREDNISONE 20 MG PO TABS: ORAL_TABLET | ORAL | 0 refills | Status: DC

## 2016-05-15 NOTE — Progress Notes (Signed)
   Subjective:    Patient ID: [name], female    DOB: 1941-02-09, 75 y.o.   MRN: [id number]  Cough  This is a new problem. The current episode started in the past 7 days. Associated symptoms include shortness of breath and wheezing.   Results for orders placed or performed in visit on 05/15/16  POCT INR  Result Value Ref Range   INR 2.0    Patient is on antibiotics to therefore we had to check her INR. Patient did not tolerate Cefzil. She was in the hospital 2 days of vomiting and diarrhea. We will avoid Cefzil in the future. Patient currently on amoxicillin she states she's had some coughing and wheezing in the low bit of shortness of breath   Review of Systems  Respiratory: Positive for cough, shortness of breath and wheezing.        Objective:   Physical Exam Lungs bilateral expiratory wheezes but not respiratory distress neck no masses HEENT is benign no crackles heard heart regular hospital records and x-ray lab work was reviewed while with the patient  O2 saturation 95%     Assessment & Plan:  Reactive airway Secondary rhinosinusitis Continue Amoxil Prednisone taper for reactive airway Albuterol on a frequent basis Follow-up if progressive troubles or worse ER if respiratory distress. Patient understands all of this. Follow-up sooner if any issues. Check INR again in approximate 2-3 weeks.

## 2016-05-15 NOTE — Patient Instructions (Signed)
Take 1 whole tablet on all days except Wednesday take 1.5 tablets

## 2016-05-21 DIAGNOSIS — I482 Chronic atrial fibrillation: Secondary | ICD-10-CM | POA: Diagnosis not present

## 2016-05-21 DIAGNOSIS — Z87891 Personal history of nicotine dependence: Secondary | ICD-10-CM | POA: Diagnosis not present

## 2016-05-21 DIAGNOSIS — E119 Type 2 diabetes mellitus without complications: Secondary | ICD-10-CM | POA: Diagnosis not present

## 2016-05-21 DIAGNOSIS — I10 Essential (primary) hypertension: Secondary | ICD-10-CM | POA: Diagnosis not present

## 2016-05-21 DIAGNOSIS — J45909 Unspecified asthma, uncomplicated: Secondary | ICD-10-CM | POA: Diagnosis not present

## 2016-05-21 DIAGNOSIS — Z7901 Long term (current) use of anticoagulants: Secondary | ICD-10-CM | POA: Diagnosis not present

## 2016-05-21 DIAGNOSIS — N179 Acute kidney failure, unspecified: Secondary | ICD-10-CM | POA: Diagnosis not present

## 2016-05-21 DIAGNOSIS — Z7952 Long term (current) use of systemic steroids: Secondary | ICD-10-CM | POA: Diagnosis not present

## 2016-05-21 DIAGNOSIS — Z7951 Long term (current) use of inhaled steroids: Secondary | ICD-10-CM | POA: Diagnosis not present

## 2016-05-21 DIAGNOSIS — E785 Hyperlipidemia, unspecified: Secondary | ICD-10-CM | POA: Diagnosis not present

## 2016-05-22 ENCOUNTER — Encounter (HOSPITAL_COMMUNITY): Admission: RE | Admit: 2016-05-22 | Payer: Medicare HMO | Source: Ambulatory Visit

## 2016-05-22 DIAGNOSIS — Z7951 Long term (current) use of inhaled steroids: Secondary | ICD-10-CM | POA: Diagnosis not present

## 2016-05-22 DIAGNOSIS — E119 Type 2 diabetes mellitus without complications: Secondary | ICD-10-CM | POA: Diagnosis not present

## 2016-05-22 DIAGNOSIS — J45909 Unspecified asthma, uncomplicated: Secondary | ICD-10-CM | POA: Diagnosis not present

## 2016-05-22 DIAGNOSIS — Z87891 Personal history of nicotine dependence: Secondary | ICD-10-CM | POA: Diagnosis not present

## 2016-05-22 DIAGNOSIS — I10 Essential (primary) hypertension: Secondary | ICD-10-CM | POA: Diagnosis not present

## 2016-05-22 DIAGNOSIS — N179 Acute kidney failure, unspecified: Secondary | ICD-10-CM | POA: Diagnosis not present

## 2016-05-22 DIAGNOSIS — Z7901 Long term (current) use of anticoagulants: Secondary | ICD-10-CM | POA: Diagnosis not present

## 2016-05-22 DIAGNOSIS — E785 Hyperlipidemia, unspecified: Secondary | ICD-10-CM | POA: Diagnosis not present

## 2016-05-22 DIAGNOSIS — Z7952 Long term (current) use of systemic steroids: Secondary | ICD-10-CM | POA: Diagnosis not present

## 2016-05-22 DIAGNOSIS — I482 Chronic atrial fibrillation: Secondary | ICD-10-CM | POA: Diagnosis not present

## 2016-05-23 DIAGNOSIS — Z7952 Long term (current) use of systemic steroids: Secondary | ICD-10-CM | POA: Diagnosis not present

## 2016-05-23 DIAGNOSIS — N179 Acute kidney failure, unspecified: Secondary | ICD-10-CM | POA: Diagnosis not present

## 2016-05-23 DIAGNOSIS — Z87891 Personal history of nicotine dependence: Secondary | ICD-10-CM | POA: Diagnosis not present

## 2016-05-23 DIAGNOSIS — I482 Chronic atrial fibrillation: Secondary | ICD-10-CM | POA: Diagnosis not present

## 2016-05-23 DIAGNOSIS — I10 Essential (primary) hypertension: Secondary | ICD-10-CM | POA: Diagnosis not present

## 2016-05-23 DIAGNOSIS — E119 Type 2 diabetes mellitus without complications: Secondary | ICD-10-CM | POA: Diagnosis not present

## 2016-05-23 DIAGNOSIS — Z7901 Long term (current) use of anticoagulants: Secondary | ICD-10-CM | POA: Diagnosis not present

## 2016-05-23 DIAGNOSIS — E785 Hyperlipidemia, unspecified: Secondary | ICD-10-CM | POA: Diagnosis not present

## 2016-05-23 DIAGNOSIS — J45909 Unspecified asthma, uncomplicated: Secondary | ICD-10-CM | POA: Diagnosis not present

## 2016-05-23 DIAGNOSIS — Z7951 Long term (current) use of inhaled steroids: Secondary | ICD-10-CM | POA: Diagnosis not present

## 2016-05-27 ENCOUNTER — Encounter (HOSPITAL_COMMUNITY): Payer: Self-pay

## 2016-05-27 ENCOUNTER — Encounter (HOSPITAL_COMMUNITY)
Admission: RE | Admit: 2016-05-27 | Discharge: 2016-05-27 | Disposition: A | Discharge status: Home / Self Care | Payer: Medicare HMO | Source: Ambulatory Visit | Attending: Internal Medicine | Admitting: Internal Medicine

## 2016-05-27 DIAGNOSIS — R110 Nausea: Secondary | ICD-10-CM

## 2016-05-27 MED ORDER — TECHNETIUM TC 99M SULFUR COLLOID
2.0000 | Freq: Once | INTRAVENOUS | Status: AC | PRN
  Administered 2016-05-27: 1.98 via ORAL

## 2016-05-29 ENCOUNTER — Ambulatory Visit: Payer: Medicare HMO | Admitting: Family Medicine

## 2016-05-29 ENCOUNTER — Other Ambulatory Visit: Payer: Self-pay | Admitting: Cardiovascular Disease

## 2016-05-29 ENCOUNTER — Other Ambulatory Visit: Payer: Self-pay | Admitting: Family Medicine

## 2016-05-29 NOTE — Telephone Encounter (Signed)
1 refill 

## 2016-05-30 ENCOUNTER — Ambulatory Visit (INDEPENDENT_AMBULATORY_CARE_PROVIDER_SITE_OTHER): Payer: Medicare HMO | Admitting: Family Medicine

## 2016-05-30 ENCOUNTER — Ambulatory Visit: Payer: Medicare HMO

## 2016-05-30 ENCOUNTER — Encounter: Payer: Self-pay | Admitting: Family Medicine

## 2016-05-30 VITALS — BP 126/72 | Temp 98.3°F | Ht 66.0 in | Wt 209.1 lb

## 2016-05-30 DIAGNOSIS — J4520 Mild intermittent asthma, uncomplicated: Secondary | ICD-10-CM | POA: Diagnosis not present

## 2016-05-30 DIAGNOSIS — Z7901 Long term (current) use of anticoagulants: Secondary | ICD-10-CM | POA: Diagnosis not present

## 2016-05-30 DIAGNOSIS — B9689 Other specified bacterial agents as the cause of diseases classified elsewhere: Secondary | ICD-10-CM | POA: Diagnosis not present

## 2016-05-30 DIAGNOSIS — J019 Acute sinusitis, unspecified: Secondary | ICD-10-CM

## 2016-05-30 LAB — POCT INR: INR: 3.5

## 2016-05-30 MED ORDER — PREDNISONE 20 MG PO TABS: ORAL_TABLET | ORAL | 0 refills | Status: DC

## 2016-05-30 MED ORDER — LEVOFLOXACIN 500 MG PO TABS: 500.0000 mg | ORAL_TABLET | Freq: Every day | ORAL | 0 refills | Status: DC

## 2016-05-30 NOTE — Progress Notes (Signed)
   Subjective:    Patient ID: [name], female    DOB: 12-08-1940, 75 y.o.   MRN: [id number]  Sinusitis  This is a new problem. The current episode started 1 to 4 weeks ago. The problem is unchanged. Maximum temperature: 99. The pain is moderate. Associated symptoms include coughing. Treatments tried: antibiotic. The treatment provided no relief.   Patient's INR today is 3.5. INR sheet is in red folder. Please complete and return to the INR binder.  Patient with coughing wheezing feels little short of breath O2 saturation 96%  Review of Systems  Respiratory: Positive for cough.   Coughing wheezing congested fatigue denies high fever chills sweats     Objective:   Physical Exam Bilateral expiratory wheezes HEENT benign heart regular not respiratory distress       Assessment & Plan:  Reactive airway Sinus infection Bronchitis Antibiotics prescribed Hold off on Coumadin for 2 days then reduce the dose by 50% Recheck INR in a few days Antibiotic prescribed steroids prescribed warning signs discuss go to ER if worse

## 2016-06-03 ENCOUNTER — Ambulatory Visit: Payer: Medicare HMO

## 2016-06-03 ENCOUNTER — Telehealth: Payer: Self-pay | Admitting: *Deleted

## 2016-06-03 ENCOUNTER — Telehealth (INDEPENDENT_AMBULATORY_CARE_PROVIDER_SITE_OTHER): Payer: Self-pay | Admitting: Internal Medicine

## 2016-06-03 NOTE — Telephone Encounter (Signed)
Patient called, stated that she would like to get the results from her stomach emptying test.  She also stated that she received a recall on her Lomotil and wants to know what she should do.  [phone]

## 2016-06-03 NOTE — Telephone Encounter (Signed)
[name] - I believe that you ordered this test.

## 2016-06-04 ENCOUNTER — Ambulatory Visit (INDEPENDENT_AMBULATORY_CARE_PROVIDER_SITE_OTHER): Payer: Medicare HMO | Admitting: Cardiovascular Disease

## 2016-06-04 ENCOUNTER — Encounter: Payer: Self-pay | Admitting: Cardiovascular Disease

## 2016-06-04 VITALS — BP 150/80 | HR 79 | Ht 61.0 in | Wt 213.0 lb

## 2016-06-04 DIAGNOSIS — I480 Paroxysmal atrial fibrillation: Secondary | ICD-10-CM | POA: Diagnosis not present

## 2016-06-04 DIAGNOSIS — R072 Precordial pain: Secondary | ICD-10-CM

## 2016-06-04 DIAGNOSIS — I2510 Atherosclerotic heart disease of native coronary artery without angina pectoris: Secondary | ICD-10-CM

## 2016-06-04 DIAGNOSIS — I10 Essential (primary) hypertension: Secondary | ICD-10-CM

## 2016-06-04 DIAGNOSIS — E782 Mixed hyperlipidemia: Secondary | ICD-10-CM

## 2016-06-04 MED ORDER — VALSARTAN 80 MG PO TABS: 80.0000 mg | ORAL_TABLET | Freq: Every day | ORAL | 3 refills | Status: DC

## 2016-06-04 MED ORDER — NITROGLYCERIN 0.4 MG/SPRAY TL SOLN: 1.0000 | 3 refills | Status: DC | PRN

## 2016-06-04 MED ORDER — NITROGLYCERIN 0.4 MG/SPRAY TL SOLN: 1.0000 | Status: DC | PRN

## 2016-06-04 NOTE — Telephone Encounter (Signed)
Results have been given to patient 

## 2016-06-04 NOTE — Progress Notes (Signed)
SUBJECTIVE: The patient presents for routine follow-up. Past medical history is significant for coronary artery disease and has had multiple percutaneous coronary interventions and has a history of a non-STEMI. She also has a history of hypertension, pulmonary embolism, and paroxysmal atrial fibrillation.  She underwent a nuclear stress test on 10/17/14 which demonstrated a small region of basal inferolateral ischemia. It was deemed a low risk study. LVEF 78% with normal LV wall motion.  Echocardiogram showed normal left ventricular systolic function, LVEF 09-23%, mild LVH with grade 2 diastolic dysfunction.  She has had multiple hospitalizations this year, most recently for nausea, vomiting, and diarrhea in November 2017 due to adverse affects from a medication.  She denies exertional chest pain and palpitations. She has an occasional chest "twinge" lasting seconds. She had been on Diovan 320 mg but this was stopped in October.  ECG on 11/17 for 17 showed normal sinus rhythm with no ischemic ST segment or T-wave abnormalities.  Creatinine 0.97 on 05/12/16.  Blood pressure 150/80 today.   Review of Systems: As per "subjective", otherwise negative.  Allergies  Allergen Reactions  . Cefzil [Cefprozil] Nausea And Vomiting  . Demerol Nausea And Vomiting    Current Outpatient Prescriptions  Medication Sig Dispense Refill  . acetaminophen (TYLENOL) 500 MG tablet Take 500 mg by mouth daily as needed for headache.    . albuterol (PROAIR HFA) 108 (90 Base) MCG/ACT inhaler INHALE 2 PUFFS UP TO FOUR TIMES DAILY AS NEEDED FOR WHEEZING. 8.5 g 2  . albuterol (PROVENTIL) (2.5 MG/3ML) 0.083% nebulizer solution Take 3 mLs (2.5 mg total) by nebulization every 6 (six) hours as needed for wheezing or shortness of breath. 75 mL 5  . atorvastatin (LIPITOR) 40 MG tablet TAKE ONE TABLET BY MOUTH DAILY. 30 tablet 1  . Blood Glucose Monitoring Suppl w/Device KIT Please dispense Glucometer, Strips and  all testing supplies per patient's preference or insurance.Test Glucose 1 time daily. Dx: E11.9 50 each 5  . Cholecalciferol (VITAMIN D3 PO) Take 1 tablet by mouth every evening.     . diltiazem (CARDIZEM CD) 180 MG 24 hr capsule Take 180 mg by mouth daily.    . diphenoxylate-atropine (LOMOTIL) 2.5-0.025 MG tablet TAKE 1 TABLET BY MOUTH TWICE DAILY AS NEEDED. 60 tablet 0  . escitalopram (LEXAPRO) 20 MG tablet Take 1 tablet (20 mg total) by mouth daily. 30 tablet 5  . fluticasone-salmeterol (ADVAIR HFA) 230-21 MCG/ACT inhaler Inhale 2 puffs into the lungs 2 (two) times daily. 1 Inhaler 12  . levofloxacin (LEVAQUIN) 500 MG tablet Take 1 tablet (500 mg total) by mouth daily. 10 tablet 0  . levothyroxine (SYNTHROID, LEVOTHROID) 50 MCG tablet TAKE ONE TABLET BY MOUTH ONCE DAILY. 30 tablet 5  . ondansetron (ZOFRAN-ODT) 4 MG disintegrating tablet DISSOLVE ONE TABLET BY MOUTH TWICE DAILY AS NEEDED. 30 tablet 0  . oxyCODONE 10 MG TABS Take 1 tablet (10 mg total) by mouth 4 (four) times daily as needed (FOR PAIN). 1 tablet 0  . pantoprazole (PROTONIX) 40 MG tablet Take 1 tablet (40 mg total) by mouth 2 (two) times daily before a meal. 60 tablet 3  . predniSONE (DELTASONE) 20 MG tablet 3qd for 3d then 2qd for 3d then 1qd for 3d 18 tablet 0  . promethazine (PHENERGAN) 12.5 MG tablet Take 1 tablet (12.5 mg total) by mouth every 6 (six) hours as needed for nausea or vomiting. 12 tablet 5  . warfarin (COUMADIN) 5 MG tablet Take 5-7.5 mg by  mouth daily. 5 mg daily except on  Wednesdays, take 7.15m     No current facility-administered medications for this visit.     Past Medical History:  Diagnosis Date  . Allergy   . Allergy history unknown   . Arthritis   . ASCVD (arteriosclerotic cardiovascular disease)    stent to mid and proximal left anterior descending in 06/2002;drug eluting stent placed in the second diagnol in 08/2003 after  A  non-st elevation myocardial infarction   . Asthma   . Atrial  fibrillation ([place]   . Chronic anticoagulation   . Chronic nausea   . Chronic pain of left knee   . Coronary artery disease   . Current chronic use of systemic steroids 05/11/2016  . Depression   . Diabetes mellitus    no insulin  . Diarrhea    acute  . FH: colonic polyps    adenomataous  . GERD (gastroesophageal reflux disease)   . History of kidney stones   . Hyperlipidemia    pulmonary embolism 2000 and 09/2008  . Hypertension   . Hypothyroidism   . Insomnia   . Irritable bowel syndrome   . Myocardial infarction   . Paroxysmal atrial fibrillation (HCC)    normal LV function; episodes occurred in 205 and 09/2007  . Pedal edema   . Peripheral edema   . PONV (postoperative nausea and vomiting)   . [place]monary embolism ([place]    2000/09/2008  . Rectal bleeding   . Sleep apnea   . Thyroid disease    hypothyroidism  . Tobacco user    stopped   . Venous stasis     Past Surgical History:  Procedure Laterality Date  . ABDOMINAL HYSTERECTOMY    . APPENDECTOMY    . BALLOON DILATION N/A 11/11/2013   Procedure: BALLOON DILATION;  Surgeon: NRogene Houston MD;  [facility];  Service: Endoscopy;  Laterality: N/A;  . BREAST REDUCTION SURGERY    . BREAST SURGERY    . CHOLECYSTECTOMY    . CLEFT PALATE REPAIR     4 surgeries  . COLONOSCOPY  2011   negative  . COLONOSCOPY N/A 05/10/2014   Procedure: COLONOSCOPY;  Surgeon: NRogene Houston MD;  [facility];  Service: Endoscopy;  Laterality: N/A;  . ESOPHAGEAL DILATION N/A 03/08/2016   Procedure: ESOPHAGEAL DILATION;  Surgeon: NRogene Houston MD;  [facility];  Service: Endoscopy;  Laterality: N/A;  . ESOPHAGOGASTRODUODENOSCOPY N/A 11/11/2013   Procedure: ESOPHAGOGASTRODUODENOSCOPY (EGD);  Surgeon: NRogene Houston MD;  [facility];  Service: Endoscopy;  Laterality: N/A;  200-moved to 100 Ann notified pt  . ESOPHAGOGASTRODUODENOSCOPY N/A 05/09/2014   Procedure: ESOPHAGOGASTRODUODENOSCOPY  (EGD);  Surgeon: NRogene Houston MD;  [facility];  Service: Endoscopy;  Laterality: N/A;  . ESOPHAGOGASTRODUODENOSCOPY N/A 03/08/2016   Procedure: ESOPHAGOGASTRODUODENOSCOPY (EGD);  Surgeon: NRogene Houston MD;  [facility];  Service: Endoscopy;  Laterality: N/A;  2:20  . HEEL SPUR SURGERY    . KNEE SURGERY    . MALONEY DILATION N/A 11/11/2013   Procedure: MVenia MinksDILATION;  Surgeon: NRogene Houston MD;  [facility];  Service: Endoscopy;  Laterality: N/A;  . OOPHORECTOMY    . SAVORY DILATION N/A 11/11/2013   Procedure: SAVORY DILATION;  Surgeon: NRogene Houston MD;  [facility];  Service: Endoscopy;  Laterality: N/A;  . TONSILLECTOMY      Social History   Social History  .  Marital status: Widowed    Spouse name: N/A  . Number of children: N/A  . Years of education: N/A   Occupational History  . retired Retired   Social History Main Topics  . Smoking status: Former Smoker    Packs/day: 2.00    Years: 30.00    Types: Cigarettes    Start date: 05/05/1987  . Smokeless tobacco: Never Used  . Alcohol use No  . Drug use: No  . Sexual activity: No   Other Topics Concern  . Not on file   Social History Narrative  . No narrative on file     Vitals:   06/04/16 1029  BP: (!) 150/80  Pulse: 79  SpO2: 97%  Weight: 213 lb (96.6 kg)  Height: _0  (1.549 m)    PHYSICAL EXAM General: NAD HEENT: Normal. Neck: No JVD, no thyromegaly. Lungs: Expiratory wheezes b/l, no rales. CV: Nondisplaced PMI.  Regular rate and rhythm, normal S1/S2, no K7/Q2, soft systolic murmur along LSB. No pretibial or periankle edema.  No carotid bruit.   Abdomen: Soft, obese.  Neurologic: Alert and oriented.  Psych: Normal affect. Skin: Normal.    ECG: Most recent ECG reviewed.      ASSESSMENT AND PLAN: 1. CAD with h/o NSTEMI and prior PCI's: Low risk nuclear stress test as noted above. ECG in 04/2016 was normal. Continue atorvastatin. No  longer on ARB (will restart Diovan 80 mg). No beta blockers due to pulmonary disease. No ASA due to warfarin. Will prescribe nitro spray.  2. Essential HTN: Elevated. Will restart Diovan 80 mg and check BMET next week.  3. Paroxysmal atrial fibrillation: Currently in a regular rhythm and without palpitations. Continue long-acting diltiazem 180 mg daily along with warfarin.  4. Pulmonary embolism: No recent recurrences. Continue warfarin.  5. Hyperlipidemia: Continue statin. Lipids due to be checked soon.  Dispo: f/u 1 year.   Kate Sable, M.D., F.A.C.C.

## 2016-06-04 NOTE — Patient Instructions (Signed)
Your physician wants you to follow-up in: 1 year Dr Virgina Jock will receive a reminder letter in the mail two months in advance. If you don't receive a letter, please call our office to schedule the follow-up appointment.    START Diovan 80 mg daily   Get lab work in 1 week :BMET    If you need a refill on your cardiac medications before your next appointment, please call your pharmacy.     Thank you for choosing [facility] !

## 2016-06-05 ENCOUNTER — Ambulatory Visit (INDEPENDENT_AMBULATORY_CARE_PROVIDER_SITE_OTHER): Payer: Medicare HMO | Admitting: Family Medicine

## 2016-06-05 ENCOUNTER — Encounter: Payer: Self-pay | Admitting: Family Medicine

## 2016-06-05 VITALS — Ht 61.0 in | Wt 215.2 lb

## 2016-06-05 DIAGNOSIS — I10 Essential (primary) hypertension: Secondary | ICD-10-CM | POA: Diagnosis not present

## 2016-06-05 DIAGNOSIS — R69 Illness, unspecified: Secondary | ICD-10-CM | POA: Diagnosis not present

## 2016-06-05 DIAGNOSIS — E785 Hyperlipidemia, unspecified: Secondary | ICD-10-CM | POA: Diagnosis not present

## 2016-06-05 DIAGNOSIS — Z7901 Long term (current) use of anticoagulants: Secondary | ICD-10-CM

## 2016-06-05 LAB — POCT INR: INR: 2.4

## 2016-06-05 MED ORDER — BLOOD GLUCOSE MONITORING SUPPL W/DEVICE KIT: PACK | 5 refills | Status: DC

## 2016-06-05 MED ORDER — ALBUTEROL SULFATE (2.5 MG/3ML) 0.083% IN NEBU: 2.5000 mg | INHALATION_SOLUTION | Freq: Four times a day (QID) | RESPIRATORY_TRACT | 5 refills | Status: AC | PRN

## 2016-06-05 NOTE — Progress Notes (Signed)
   Subjective:    Patient ID: [name], female    DOB: 05-21-1941, 75 y.o.   MRN: [id number]  Hypertension  This is a chronic problem. The current episode started more than 1 year ago. Treatments tried: diovan, cardizem.  Patient claims compliance with medication. Watching salt intake. Generally does not miss a dose. Blood pressure generally good when checked elsewhere. Next  Recently on antibiotics. Has follow-up INR today. Overall respiratory infection has improved INR 2.4  Alternating coumadin 5 mg and 2.5 mg every other day  Patient continues to take lipid medication regularly. No obvious side effects from it. Generally does not miss a dose. Prior blood work results are reviewed with patient. Patient continues to work on fat intake in diet  Patient notes ongoing compliance with antidepressant medication. No obvious side effects. Reports does not miss a dose. Overall continues to help depression substantially. No thoughts of homicide or suicide. Would like to maintain medication.   Review of Systems No headache, no major weight loss or weight gain, no chest pain no back pain abdominal pain no change in bowel habits complete ROS otherwise negative     Objective:   Physical Exam  Alert vitals stable, NAD. Blood pressure good on repeat. HEENT normal. Lungs clear. Heart regular rate and rhythm.       Assessment & Plan:  Impression 1 hypertension good control discussed maintain same meds #2 respiratory infection abated no need for further antibiotics this time discussed #3 anticoagulation numbers good maintain same recheck this a couple weeks #4 depression clinically stable discussed patient would like to stand medication for sure #5 hyperlipidemia prior blood work discussed maintain same meds plan follow-up from INR as scheduled. Diet exercise discussed. Medications refilled. Recheck a regular checkup

## 2016-06-11 DIAGNOSIS — J45998 Other asthma: Secondary | ICD-10-CM | POA: Diagnosis not present

## 2016-06-14 ENCOUNTER — Other Ambulatory Visit: Payer: Self-pay | Admitting: Family Medicine

## 2016-06-19 ENCOUNTER — Ambulatory Visit: Payer: Medicare HMO

## 2016-06-20 ENCOUNTER — Ambulatory Visit (INDEPENDENT_AMBULATORY_CARE_PROVIDER_SITE_OTHER): Payer: Medicare HMO | Admitting: *Deleted

## 2016-06-20 ENCOUNTER — Other Ambulatory Visit: Payer: Self-pay | Admitting: Cardiovascular Disease

## 2016-06-20 DIAGNOSIS — Z7901 Long term (current) use of anticoagulants: Secondary | ICD-10-CM | POA: Diagnosis not present

## 2016-06-20 DIAGNOSIS — I10 Essential (primary) hypertension: Secondary | ICD-10-CM | POA: Diagnosis not present

## 2016-06-20 LAB — POCT INR: INR: 2

## 2016-06-20 NOTE — Patient Instructions (Signed)
Coumadin 5mg  take one tablet on Mondays, Wednesdays and Fridays. Take one half tablet on Sundays, tuesdays, thurdsdays, and saturdays.

## 2016-06-21 LAB — BASIC METABOLIC PANEL
BUN/Creatinine Ratio: 17 (ref 12–28)
BUN: 16 mg/dL (ref 8–27)
CALCIUM: 9.2 mg/dL (ref 8.7–10.3)
CO2: 23 mmol/L (ref 18–29)
Chloride: 101 mmol/L (ref 96–106)
Creatinine, Ser: 0.93 mg/dL (ref 0.57–1.00)
GFR calc Af Amer: 69 mL/min/{1.73_m2} (ref >59)
GFR, EST NON AFRICAN AMERICAN: 59 mL/min/{1.73_m2} — AB (ref >59)
GLUCOSE: 91 mg/dL (ref 65–99)
POTASSIUM: 4.5 mmol/L (ref 3.5–5.2)
Sodium: 142 mmol/L (ref 134–144)

## 2016-07-12 DIAGNOSIS — J45998 Other asthma: Secondary | ICD-10-CM | POA: Diagnosis not present

## 2016-07-16 ENCOUNTER — Other Ambulatory Visit: Payer: Self-pay | Admitting: Family Medicine

## 2016-07-18 ENCOUNTER — Ambulatory Visit: Payer: Medicare HMO

## 2016-07-19 ENCOUNTER — Telehealth: Payer: Self-pay | Admitting: Family Medicine

## 2016-07-19 ENCOUNTER — Ambulatory Visit: Payer: Medicare HMO

## 2016-07-19 ENCOUNTER — Other Ambulatory Visit: Payer: Self-pay | Admitting: Nurse Practitioner

## 2016-07-19 MED ORDER — DIPHENOXYLATE-ATROPINE 2.5-0.025 MG PO TABS
1.0000 | ORAL_TABLET | Freq: Two times a day (BID) | ORAL | 2 refills | Status: DC | PRN
Start: 2016-07-19 — End: 2017-01-24

## 2016-07-19 NOTE — Telephone Encounter (Signed)
done

## 2016-07-19 NOTE — Telephone Encounter (Signed)
Pt.notified

## 2016-07-19 NOTE — Telephone Encounter (Signed)
Patient requesting Rx for lomotil.  [facility]

## 2016-07-19 NOTE — Telephone Encounter (Signed)
Pt states she keeps lomotil because she has diarrhea everyday. Has enough for one more dose. Would like to get today if possible.

## 2016-07-23 ENCOUNTER — Ambulatory Visit: Payer: Medicare HMO

## 2016-07-24 ENCOUNTER — Ambulatory Visit (INDEPENDENT_AMBULATORY_CARE_PROVIDER_SITE_OTHER): Payer: Medicare HMO | Admitting: Nurse Practitioner

## 2016-07-24 ENCOUNTER — Other Ambulatory Visit: Payer: Self-pay | Admitting: Nurse Practitioner

## 2016-07-24 VITALS — Ht 61.0 in | Wt 215.2 lb

## 2016-07-24 DIAGNOSIS — N6320 Unspecified lump in the left breast, unspecified quadrant: Secondary | ICD-10-CM

## 2016-07-24 DIAGNOSIS — Z7901 Long term (current) use of anticoagulants: Secondary | ICD-10-CM

## 2016-07-24 LAB — POCT INR: INR: 1.6

## 2016-07-25 ENCOUNTER — Encounter: Payer: Self-pay | Admitting: Nurse Practitioner

## 2016-07-25 DIAGNOSIS — R69 Illness, unspecified: Secondary | ICD-10-CM | POA: Diagnosis not present

## 2016-07-25 NOTE — Progress Notes (Signed)
Subjective:  Presents for a lump in her left breast for the past month. Slightly sore at times. Has not had a mammogram since 2013 although they have been ordered. [place]: her maternal aunt had breast CA; otherwise neg.   Objective:   Ht 5\' 1"  (1.549 m)   Wt 215 lb 3.2 oz (97.6 kg)   BMI 40.66 kg/m  NAD. Alert, oriented. Lungs clear. Heart RRR. Right breast: no masses; axillae no adenopathy. Left breast: a small nodule at the outer part of the left areola that goes linear into the breast. Minimal tenderness; no erythema or warmth. Adjacent to this area is a very large thick mass over 4 cm in diameter. No obvious axillary lymphadenopathy.  Results for orders placed or performed in visit on 07/24/16  POCT INR  Result Value Ref Range   INR 1.6     Assessment:  Mass of left breast - Plan: MM Digital Diagnostic Unilat L  Long-term (current) use of anticoagulants - Plan: POCT INR  Long term current use of anticoagulant therapy    Plan: Mammogram pending. Changes made to Coumadin regimen; repeat INR in 4 weeks.

## 2016-08-01 ENCOUNTER — Telehealth: Payer: Self-pay | Admitting: Nurse Practitioner

## 2016-08-01 DIAGNOSIS — N6320 Unspecified lump in the left breast, unspecified quadrant: Secondary | ICD-10-CM

## 2016-08-01 NOTE — Telephone Encounter (Signed)
Patient has a Mammogram scheduled for 08/06/16 . [facility] called stating that the order must be for a Diagnostic tomo bilateral mammogram and patient must also have ultrasound scheduled for right and left sides since it will be for a bilateral mammogram. Can we change the order.

## 2016-08-02 ENCOUNTER — Other Ambulatory Visit: Payer: Self-pay | Admitting: *Deleted

## 2016-08-02 DIAGNOSIS — E7800 Pure hypercholesterolemia, unspecified: Secondary | ICD-10-CM | POA: Diagnosis not present

## 2016-08-02 DIAGNOSIS — K580 Irritable bowel syndrome with diarrhea: Secondary | ICD-10-CM | POA: Diagnosis not present

## 2016-08-02 DIAGNOSIS — I10 Essential (primary) hypertension: Secondary | ICD-10-CM | POA: Diagnosis not present

## 2016-08-02 DIAGNOSIS — E039 Hypothyroidism, unspecified: Secondary | ICD-10-CM | POA: Diagnosis not present

## 2016-08-02 DIAGNOSIS — R69 Illness, unspecified: Secondary | ICD-10-CM | POA: Diagnosis not present

## 2016-08-02 DIAGNOSIS — E669 Obesity, unspecified: Secondary | ICD-10-CM | POA: Diagnosis not present

## 2016-08-02 DIAGNOSIS — Z79891 Long term (current) use of opiate analgesic: Secondary | ICD-10-CM | POA: Diagnosis not present

## 2016-08-02 DIAGNOSIS — J45909 Unspecified asthma, uncomplicated: Secondary | ICD-10-CM | POA: Diagnosis not present

## 2016-08-02 DIAGNOSIS — K219 Gastro-esophageal reflux disease without esophagitis: Secondary | ICD-10-CM | POA: Diagnosis not present

## 2016-08-02 DIAGNOSIS — N6320 Unspecified lump in the left breast, unspecified quadrant: Secondary | ICD-10-CM

## 2016-08-02 DIAGNOSIS — Z87891 Personal history of nicotine dependence: Secondary | ICD-10-CM | POA: Diagnosis not present

## 2016-08-02 DIAGNOSIS — I480 Paroxysmal atrial fibrillation: Secondary | ICD-10-CM | POA: Diagnosis not present

## 2016-08-02 DIAGNOSIS — Z0000 Encounter for general adult medical examination without abnormal findings: Secondary | ICD-10-CM | POA: Diagnosis not present

## 2016-08-02 NOTE — Telephone Encounter (Signed)
done

## 2016-08-02 NOTE — Telephone Encounter (Signed)
Yes. Please change the order to whatever they need. Thanks.

## 2016-08-02 NOTE — Telephone Encounter (Signed)
Order changed will just need e-signature

## 2016-08-02 NOTE — Addendum Note (Signed)
Addended by: Ofilia Neas R on: 08/02/2016 11:18 AM   Modules accepted: Orders

## 2016-08-05 ENCOUNTER — Other Ambulatory Visit: Payer: Self-pay | Admitting: Nurse Practitioner

## 2016-08-05 DIAGNOSIS — N6320 Unspecified lump in the left breast, unspecified quadrant: Secondary | ICD-10-CM

## 2016-08-06 ENCOUNTER — Ambulatory Visit (HOSPITAL_COMMUNITY)
Admission: RE | Admit: 2016-08-06 | Discharge: 2016-08-06 | Disposition: A | Discharge status: Home / Self Care | Payer: Medicare HMO | Source: Ambulatory Visit | Attending: Nurse Practitioner | Admitting: Nurse Practitioner

## 2016-08-06 ENCOUNTER — Ambulatory Visit (INDEPENDENT_AMBULATORY_CARE_PROVIDER_SITE_OTHER): Payer: Medicare HMO | Admitting: Internal Medicine

## 2016-08-06 ENCOUNTER — Encounter (HOSPITAL_COMMUNITY): Payer: Medicare HMO

## 2016-08-06 DIAGNOSIS — N6320 Unspecified lump in the left breast, unspecified quadrant: Secondary | ICD-10-CM

## 2016-08-06 DIAGNOSIS — N6002 Solitary cyst of left breast: Secondary | ICD-10-CM | POA: Diagnosis not present

## 2016-08-06 DIAGNOSIS — R928 Other abnormal and inconclusive findings on diagnostic imaging of breast: Secondary | ICD-10-CM | POA: Diagnosis not present

## 2016-08-06 DIAGNOSIS — N6321 Unspecified lump in the left breast, upper outer quadrant: Secondary | ICD-10-CM | POA: Diagnosis not present

## 2016-08-15 ENCOUNTER — Other Ambulatory Visit: Payer: Self-pay | Admitting: Cardiovascular Disease

## 2016-08-15 ENCOUNTER — Other Ambulatory Visit: Payer: Self-pay | Admitting: Family Medicine

## 2016-08-15 DIAGNOSIS — E1140 Type 2 diabetes mellitus with diabetic neuropathy, unspecified: Secondary | ICD-10-CM | POA: Diagnosis not present

## 2016-08-15 DIAGNOSIS — M79673 Pain in unspecified foot: Secondary | ICD-10-CM | POA: Diagnosis not present

## 2016-08-15 DIAGNOSIS — M2030 Hallux varus (acquired), unspecified foot: Secondary | ICD-10-CM | POA: Diagnosis not present

## 2016-08-15 DIAGNOSIS — B351 Tinea unguium: Secondary | ICD-10-CM | POA: Diagnosis not present

## 2016-08-15 DIAGNOSIS — M2040 Other hammer toe(s) (acquired), unspecified foot: Secondary | ICD-10-CM | POA: Diagnosis not present

## 2016-08-16 ENCOUNTER — Ambulatory Visit (INDEPENDENT_AMBULATORY_CARE_PROVIDER_SITE_OTHER): Payer: Medicare HMO | Admitting: Urology

## 2016-08-16 ENCOUNTER — Other Ambulatory Visit (HOSPITAL_COMMUNITY)
Admission: RE | Admit: 2016-08-16 | Discharge: 2016-08-16 | Disposition: A | Discharge status: Home / Self Care | Payer: Medicare HMO | Source: Other Acute Inpatient Hospital | Attending: Urology | Admitting: Urology

## 2016-08-16 DIAGNOSIS — R32 Unspecified urinary incontinence: Secondary | ICD-10-CM | POA: Insufficient documentation

## 2016-08-16 DIAGNOSIS — R3121 Asymptomatic microscopic hematuria: Secondary | ICD-10-CM

## 2016-08-16 DIAGNOSIS — N3941 Urge incontinence: Secondary | ICD-10-CM

## 2016-08-16 LAB — URINALYSIS, COMPLETE (UACMP) WITH MICROSCOPIC
Bilirubin Urine: NEGATIVE
Glucose, UA: NEGATIVE mg/dL
Ketones, ur: NEGATIVE mg/dL
Leukocytes, UA: NEGATIVE
Nitrite: NEGATIVE
PH: 5 (ref 5.0–8.0)
Protein, ur: NEGATIVE mg/dL
SPECIFIC GRAVITY, URINE: 1.021 (ref 1.005–1.030)

## 2016-08-17 LAB — URINE CULTURE

## 2016-08-22 ENCOUNTER — Ambulatory Visit: Payer: Medicare HMO | Admitting: Family Medicine

## 2016-08-22 ENCOUNTER — Ambulatory Visit: Payer: Medicare HMO

## 2016-08-23 ENCOUNTER — Ambulatory Visit: Payer: Medicare HMO

## 2016-08-26 ENCOUNTER — Ambulatory Visit (INDEPENDENT_AMBULATORY_CARE_PROVIDER_SITE_OTHER): Payer: Medicare HMO | Admitting: Family Medicine

## 2016-08-26 ENCOUNTER — Encounter: Payer: Self-pay | Admitting: Family Medicine

## 2016-08-26 VITALS — Ht 61.0 in | Wt 215.4 lb

## 2016-08-26 DIAGNOSIS — I481 Persistent atrial fibrillation: Secondary | ICD-10-CM | POA: Diagnosis not present

## 2016-08-26 DIAGNOSIS — E119 Type 2 diabetes mellitus without complications: Secondary | ICD-10-CM

## 2016-08-26 DIAGNOSIS — I10 Essential (primary) hypertension: Secondary | ICD-10-CM | POA: Diagnosis not present

## 2016-08-26 DIAGNOSIS — M1711 Unilateral primary osteoarthritis, right knee: Secondary | ICD-10-CM | POA: Diagnosis not present

## 2016-08-26 DIAGNOSIS — Z7901 Long term (current) use of anticoagulants: Secondary | ICD-10-CM | POA: Diagnosis not present

## 2016-08-26 DIAGNOSIS — I4819 Other persistent atrial fibrillation: Secondary | ICD-10-CM

## 2016-08-26 LAB — POCT INR: INR: 1.4

## 2016-08-26 MED ORDER — FLUTICASONE PROPIONATE HFA 110 MCG/ACT IN AERO: INHALATION_SPRAY | RESPIRATORY_TRACT | 6 refills | Status: DC

## 2016-08-26 NOTE — Progress Notes (Signed)
   Subjective:    Patient ID: [name], female    DOB: Sep 25, 1940, 76 y.o.   MRN: [id number]  HPI  This patient was seen today for chronic pain  The medication list was reviewed and updated.   -Compliance with medication: yes  - Number patient states they take daily: 2-3  -when was the last dose patient took? Out currently  The patient was advised the importance of maintaining medication and not using illegal substances with these.  Refills needed: yes The patient was educated that we can provide 3 monthly scripts for their medication, it is their responsibility to follow the instructions.  Side effects or complications from medications: none  Patient is aware that pain medications are meant to minimize the severity of the pain to allow their pain levels to improve to allow for better function. They are aware of that pain medications cannot totally remove their pain.  INR 1.4   -doing half tablet tues, thurs and Sunday  Peeing all the time- seeing urologist who did a scope and put her on mybetriq but was told it could make her blood pressure go up so she is concerned about taking it  Results for orders placed or performed in visit on 08/26/16  POCT INR  Result Value Ref Range   INR 1.4     Pt saw dr Jeffie Pollock, was placed on mybetriq, had a bladder scope, tol it was negative   major agravation with urinating   Very frequent       Review of Systems No headache, no major weight loss or weight gain, no chest pain no back pain abdominal pain no change in bowel habits complete ROS otherwise negative     Objective:   Physical Exam  Alert and oriented, vitals reviewed and stable, NAD,obesity present ENT-TM's and ext canals WNL bilat via otoscopic exam Soft palate, tonsils and post pharynx WNL via oropharyngeal exam Neck-symmetric, no masses; thyroid nonpalpable and nontender Pulmonary-no tachypnea or accessory muscle use; Clear with rarewheezes via  auscultation Card--no abnrml murmurs, rhythm not reg and rate WNL Carotid pulses symmetric, without bruits Ankles one plus edema  Low back tender to perc        Assessment & Plan:  Chronic pain- pt not completely sure of how often using pain med, will research usage @[facility]  concerns disc at length' 3 A fib stable 4 asthma-fair control 5 Anticoag sub opt, coum adjusted

## 2016-08-27 MED ORDER — OXYCODONE HCL 10 MG PO TABS: 10.0000 mg | ORAL_TABLET | Freq: Two times a day (BID) | ORAL | 0 refills | Status: DC | PRN

## 2016-09-03 ENCOUNTER — Ambulatory Visit: Payer: Medicare HMO | Admitting: Family Medicine

## 2016-09-04 ENCOUNTER — Telehealth: Payer: Self-pay | Admitting: Family Medicine

## 2016-09-04 NOTE — Telephone Encounter (Signed)
3 prescriptions were written up front for patient pick up 09/04/16- Patient notified.

## 2016-09-04 NOTE — Telephone Encounter (Signed)
Requesting Rx for oxycodone.

## 2016-09-06 ENCOUNTER — Encounter: Payer: Self-pay | Admitting: Nurse Practitioner

## 2016-09-06 ENCOUNTER — Ambulatory Visit (INDEPENDENT_AMBULATORY_CARE_PROVIDER_SITE_OTHER): Payer: Medicare HMO | Admitting: Nurse Practitioner

## 2016-09-06 VITALS — BP 132/74 | Temp 98.5°F | Ht 61.0 in | Wt 215.0 lb

## 2016-09-06 DIAGNOSIS — T887XXA Unspecified adverse effect of drug or medicament, initial encounter: Secondary | ICD-10-CM | POA: Diagnosis not present

## 2016-09-06 DIAGNOSIS — R519 Headache, unspecified: Secondary | ICD-10-CM

## 2016-09-06 DIAGNOSIS — Z23 Encounter for immunization: Secondary | ICD-10-CM

## 2016-09-06 DIAGNOSIS — R51 Headache: Secondary | ICD-10-CM | POA: Diagnosis not present

## 2016-09-06 DIAGNOSIS — T50905A Adverse effect of unspecified drugs, medicaments and biological substances, initial encounter: Secondary | ICD-10-CM

## 2016-09-06 NOTE — Progress Notes (Signed)
Subjective:  Presents today for headache.  Recently seen by urology per patient report.  Was started on Myrbetriq 25mg  Q day 2 weeks ago.  Improved urinary symptoms, but began having headache 1 week ago. Frontal, band-like h/a, worse in evenings, and when wakes up.  Tyelnol prn with some relief, but h/a remains dull throughout the remainder of the day.  Denies any acute changes in vision, photophobia, phonophobia, nausea, dizziness/vertigo, numbness, tingling, or weakness.  Denies any sinus pressure, rhinorrhea, ear pain, fever, or sore throat.  Denies any SOB, cough, or wheezing.  Denies any CP, palpitations, or LE swelling. Denies numbness or weakness of arms or legs. No difficulty speaking or swallowing. No other changes in medication regimen.    Objective:   BP 132/74   Temp 98.5 F (36.9 C) (Oral)   Ht 5\' 1"  (1.549 m)   Wt 215 lb (97.5 kg)   BMI 40.62 kg/m  Alert and oriented, NAD.  Ears: TM non injected, very mild clear effusion bilaterally  Pharynx: non injected, no rashes/lesions Neck: no cervical adenopathy  Chest: Lungs CTA  Cardiac: RRR, no murmurs   Assessment:  Acute nonintractable headache, unspecified headache type  Need for vaccination - Plan: Pneumococcal conjugate vaccine 13-valent IM  Medication side effect, initial encounter   Plan:   Headache possible side effect to Myrbetriq.  Hold medication over the weekend. If headache resolves, patient to contact urology for change in medication. Note she is supposed to increase dose next week. If headache persists despite stopping Myrbetriq, call our office on Monday.    Discussed OTC interventions for improving dry mouth. Continue drinking fluids to stay hydrated.    Contact cardiologist for refill of Lipitor and lipid management.    Follow up as needed.

## 2016-09-06 NOTE — Patient Instructions (Signed)
Over the counter Biotene to help with Dry mouth

## 2016-09-07 ENCOUNTER — Encounter: Payer: Self-pay | Admitting: Nurse Practitioner

## 2016-09-10 DIAGNOSIS — R69 Illness, unspecified: Secondary | ICD-10-CM | POA: Diagnosis not present

## 2016-09-11 ENCOUNTER — Encounter: Payer: Medicare HMO | Admitting: Nurse Practitioner

## 2016-09-19 ENCOUNTER — Encounter: Payer: Medicare HMO | Admitting: Nurse Practitioner

## 2016-09-23 ENCOUNTER — Ambulatory Visit (INDEPENDENT_AMBULATORY_CARE_PROVIDER_SITE_OTHER): Payer: Medicare HMO | Admitting: Family Medicine

## 2016-09-23 ENCOUNTER — Encounter: Payer: Self-pay | Admitting: Family Medicine

## 2016-09-23 VITALS — Temp 100.3°F | Ht 61.0 in | Wt 215.0 lb

## 2016-09-23 DIAGNOSIS — R042 Hemoptysis: Secondary | ICD-10-CM

## 2016-09-23 DIAGNOSIS — J40 Bronchitis, not specified as acute or chronic: Secondary | ICD-10-CM

## 2016-09-23 DIAGNOSIS — J4521 Mild intermittent asthma with (acute) exacerbation: Secondary | ICD-10-CM | POA: Diagnosis not present

## 2016-09-23 DIAGNOSIS — R509 Fever, unspecified: Secondary | ICD-10-CM

## 2016-09-23 MED ORDER — DOXYCYCLINE HYCLATE 100 MG PO TABS: 100.0000 mg | ORAL_TABLET | Freq: Two times a day (BID) | ORAL | 0 refills | Status: DC

## 2016-09-23 MED ORDER — PREDNISONE 20 MG PO TABS: ORAL_TABLET | ORAL | 0 refills | Status: DC

## 2016-09-23 MED ORDER — CEFTRIAXONE SODIUM 1 G IJ SOLR
500.0000 mg | Freq: Once | INTRAMUSCULAR | Status: AC
  Administered 2016-09-23: 500 mg via INTRAMUSCULAR

## 2016-09-23 NOTE — Progress Notes (Signed)
   Subjective:    Patient ID: [name], female    DOB: 1941/06/21, 76 y.o.   MRN: [id number]  Fever   This is a new problem. The current episode started in the past 7 days. Associated symptoms include congestion, coughing, headaches, a sore throat and wheezing.  Patient had a viral prodrome in the early days of this. Now noting progressive cough. Also progressive [place] shortness of breath. Also more challenges with wheezing.   Coughing up blood in sputum  Headache worse in the right temple  Pt running fever, or pretty sure of it, felt very warm did not actually ck temp . Using inhaler twice per night    Review of Systems  Constitutional: Positive for fever.  HENT: Positive for congestion and sore throat.   Respiratory: Positive for cough and wheezing.   Neurological: Positive for headaches.       Objective:   Physical Exam  Alert mild malaise vital stable HEENT positive congestion pharynx normal lungs impressive bilateral wheezes no tachypnea no inspiratory crackles heart regular rate and rhythm.      Assessment & Plan:  Impression viral syndrome now with secondary exacerbation of asthma and febrile illness and at least bronchitis. Discussed. Hemoptysis continued from it progressive infection or he could be simply secondary to bronchial inflammation plus Coumadin. Discussed also. Even have early pneumonia. Also challenged by anticoagulant. Due to have blood work today. However with initiating broad-spectrum antibiotics today, will wait and do INR next week. Rocephin injection. Doxsee twice a day. Increase albuterol to 4 times a day warning signs discussed.Greater than 50% of this 25 minute face to face visit was spent in counseling and discussion and coordination of care regarding the above diagnosis/diagnosies

## 2016-09-24 ENCOUNTER — Ambulatory Visit: Payer: Medicare HMO

## 2016-10-01 ENCOUNTER — Ambulatory Visit (INDEPENDENT_AMBULATORY_CARE_PROVIDER_SITE_OTHER): Payer: Medicare HMO | Admitting: *Deleted

## 2016-10-01 DIAGNOSIS — Z7901 Long term (current) use of anticoagulants: Secondary | ICD-10-CM | POA: Diagnosis not present

## 2016-10-01 LAB — POCT INR: INR: 5.3

## 2016-10-08 ENCOUNTER — Encounter: Payer: Self-pay | Admitting: Family Medicine

## 2016-10-08 ENCOUNTER — Ambulatory Visit (INDEPENDENT_AMBULATORY_CARE_PROVIDER_SITE_OTHER): Payer: Medicare HMO | Admitting: Family Medicine

## 2016-10-08 VITALS — BP 122/74 | Temp 98.5°F | Ht 61.0 in | Wt 219.0 lb

## 2016-10-08 DIAGNOSIS — J4521 Mild intermittent asthma with (acute) exacerbation: Secondary | ICD-10-CM

## 2016-10-08 DIAGNOSIS — Z7901 Long term (current) use of anticoagulants: Secondary | ICD-10-CM | POA: Diagnosis not present

## 2016-10-08 DIAGNOSIS — J40 Bronchitis, not specified as acute or chronic: Secondary | ICD-10-CM | POA: Diagnosis not present

## 2016-10-08 LAB — POCT INR: INR: 1.4

## 2016-10-08 NOTE — Progress Notes (Signed)
   Subjective:    Patient ID: [name], female    DOB: 11/11/1940, 76 y.o.   MRN: [id number]  HPIFollow up bronchitis. Finished antibotic and prednisone. Still having a little wheezing and cough at night.  pred is gone,  Not using the neb at all  Has stayed on flovent   Steroid injec definitely jhelped last tie  Bilateral leg pain. Started this am.   Bilateral arm pain. Started 3 days ago. Taking tylenol.  INR today 1.4.   Knees flaring up, might need an injection    Review of Systems No headache, no major weight loss or weight gain, no chest pain no back pain abdominal pain no change in bowel habits complete ROS otherwise negative     Objective:   Physical Exam  Alert and oriented, vitals reviewed and stable, NAD ENT-TM's and ext canals WNL bilat via otoscopic exam Soft palate, tonsils and post pharynx WNL via oropharyngeal exam Neck-symmetric, no masses; thyroid nonpalpable and nontender Pulmonary-no tachypnea or accessory muscle use; Clear without wheezes via auscultation Card--no abnrml murmurs, rhythm reg and rate WNL Carotid pulses symmetric, without bruits Positive pain with extension and flexion of knees.      Assessment & Plan:  Impression recent exacerbation of asthma bronchitis clinically improved somewhat #2 anticoagulation suboptimum discuss with need to adjust Coumadin No. 3 ongoing pains in multiple areas. This particular time nasal flaring up. 2 visit to orthopedist plan meds adjusted recheck INR couple weeks warning signs discussed  Greater than 50% of this 25 minute face to face visit was spent in counseling and discussion and coordination of care regarding the above diagnosis/diagnosies

## 2016-10-11 ENCOUNTER — Ambulatory Visit: Payer: Medicare HMO | Admitting: Urology

## 2016-10-11 ENCOUNTER — Other Ambulatory Visit: Payer: Self-pay | Admitting: Cardiovascular Disease

## 2016-10-22 ENCOUNTER — Ambulatory Visit: Payer: Medicare HMO

## 2016-10-22 ENCOUNTER — Ambulatory Visit (INDEPENDENT_AMBULATORY_CARE_PROVIDER_SITE_OTHER): Payer: Medicare HMO

## 2016-10-22 DIAGNOSIS — M1712 Unilateral primary osteoarthritis, left knee: Secondary | ICD-10-CM | POA: Diagnosis not present

## 2016-10-22 DIAGNOSIS — M1711 Unilateral primary osteoarthritis, right knee: Secondary | ICD-10-CM | POA: Diagnosis not present

## 2016-10-22 DIAGNOSIS — S66911D Strain of unspecified muscle, fascia and tendon at wrist and hand level, right hand, subsequent encounter: Secondary | ICD-10-CM | POA: Diagnosis not present

## 2016-10-22 DIAGNOSIS — Z7901 Long term (current) use of anticoagulants: Secondary | ICD-10-CM

## 2016-10-22 DIAGNOSIS — S66912D Strain of unspecified muscle, fascia and tendon at wrist and hand level, left hand, subsequent encounter: Secondary | ICD-10-CM | POA: Diagnosis not present

## 2016-10-22 LAB — POCT INR: INR: 1.7

## 2016-10-22 NOTE — Patient Instructions (Signed)
Take 1/2 tablet on Sundays, and Thursdays and 1 whole tablet all other days.

## 2016-10-28 ENCOUNTER — Other Ambulatory Visit (INDEPENDENT_AMBULATORY_CARE_PROVIDER_SITE_OTHER): Payer: Self-pay | Admitting: Internal Medicine

## 2016-11-05 ENCOUNTER — Ambulatory Visit: Payer: Medicare HMO

## 2016-11-07 ENCOUNTER — Ambulatory Visit (INDEPENDENT_AMBULATORY_CARE_PROVIDER_SITE_OTHER): Payer: Medicare HMO

## 2016-11-07 DIAGNOSIS — Z7901 Long term (current) use of anticoagulants: Secondary | ICD-10-CM | POA: Diagnosis not present

## 2016-11-07 LAB — POCT INR: INR: 1.7

## 2016-11-07 NOTE — Patient Instructions (Signed)
Take one half tablet on Sunday, and one tablet all other days. Recheck INR 3 weeks

## 2016-11-07 NOTE — Progress Notes (Signed)
This encounter was created in error - please disregard.

## 2016-11-12 ENCOUNTER — Telehealth (INDEPENDENT_AMBULATORY_CARE_PROVIDER_SITE_OTHER): Payer: Self-pay | Admitting: Internal Medicine

## 2016-11-12 NOTE — Telephone Encounter (Signed)
Patient called, stated that she is having diarrhea more than normal and would like to see Dr. Laural Golden.  I called the patient and offered her an appointment with Terri.  She declined.  She wanted me to let Dr. Laural Golden know that she was taking one Lomotil to start off, then went to two at a time and she's now taking three at a time.  She stated it's not working like it use to.  She would like suggestions.  [phone]

## 2016-11-13 NOTE — Telephone Encounter (Signed)
Per Dr.Rehman the patient should take 1 by mouth three times daily. She needs to make sure that she has fiber in her diet. Patient was called and made aware.

## 2016-11-13 NOTE — Telephone Encounter (Signed)
Patient states that she eats Rasin Bran and it goes straight through her. It was suggested that she go to her pharmacy and get OTC Fiber and that Dr.Rehman recommends that his patient's take 3-4 grams daily.  Patient states that she is going to get some of that and try it and that she is going to also going to take that Lomotil on a schedule. A progress report in a few days.

## 2016-11-18 ENCOUNTER — Other Ambulatory Visit: Payer: Self-pay | Admitting: Family Medicine

## 2016-11-27 ENCOUNTER — Ambulatory Visit: Payer: Medicare HMO | Admitting: Family Medicine

## 2016-11-28 ENCOUNTER — Other Ambulatory Visit: Payer: Self-pay | Admitting: Cardiovascular Disease

## 2016-11-29 ENCOUNTER — Encounter: Payer: Self-pay | Admitting: Family Medicine

## 2016-11-29 ENCOUNTER — Ambulatory Visit (INDEPENDENT_AMBULATORY_CARE_PROVIDER_SITE_OTHER): Payer: Medicare HMO | Admitting: Family Medicine

## 2016-11-29 VITALS — BP 128/84 | Ht 61.0 in | Wt 218.2 lb

## 2016-11-29 DIAGNOSIS — I481 Persistent atrial fibrillation: Secondary | ICD-10-CM

## 2016-11-29 DIAGNOSIS — I4819 Other persistent atrial fibrillation: Secondary | ICD-10-CM

## 2016-11-29 DIAGNOSIS — F331 Major depressive disorder, recurrent, moderate: Secondary | ICD-10-CM | POA: Diagnosis not present

## 2016-11-29 DIAGNOSIS — Z7901 Long term (current) use of anticoagulants: Secondary | ICD-10-CM

## 2016-11-29 DIAGNOSIS — E119 Type 2 diabetes mellitus without complications: Secondary | ICD-10-CM | POA: Diagnosis not present

## 2016-11-29 DIAGNOSIS — Z79899 Other long term (current) drug therapy: Secondary | ICD-10-CM | POA: Diagnosis not present

## 2016-11-29 DIAGNOSIS — E039 Hypothyroidism, unspecified: Secondary | ICD-10-CM

## 2016-11-29 DIAGNOSIS — E785 Hyperlipidemia, unspecified: Secondary | ICD-10-CM | POA: Diagnosis not present

## 2016-11-29 DIAGNOSIS — R69 Illness, unspecified: Secondary | ICD-10-CM | POA: Diagnosis not present

## 2016-11-29 LAB — POCT INR: INR: 2.3

## 2016-11-29 LAB — POCT GLYCOSYLATED HEMOGLOBIN (HGB A1C): HEMOGLOBIN A1C: 5.4

## 2016-11-29 MED ORDER — OXYCODONE HCL 10 MG PO TABS: 10.0000 mg | ORAL_TABLET | Freq: Two times a day (BID) | ORAL | 0 refills | Status: DC | PRN

## 2016-11-29 NOTE — Progress Notes (Signed)
   Subjective:    Patient ID: [name], female    DOB: 1940-09-01, 76 y.o.   MRN: [id number] Patient arrives office with half a dozen problems each of which could spend the whole visit with the specialist Diabetes  She presents for her follow-up diabetic visit. She has type 2 diabetes mellitus. She has not had a previous visit with a dietitian. She sees a podiatrist.Eye exam is not current.   Patient also has concerns of appetite.     Results for orders placed or performed in visit on 11/29/16  POCT HgB A1C  Result Value Ref Range   Hemoglobin A1C 5.4   POCT INR  Result Value Ref Range   INR 2.3    Patient continues to take lipid medication regularly. No obvious side effects from it. Generally does not miss a dose. Prior blood work results are reviewed with patient. Patient continues to work on fat intake in diet  Hepatic Function Latest Ref Rng & Units 05/10/2016 04/09/2016 04/08/2016  Total Protein 6.5 - 8.1 g/dL 7.7 6.1(L) 6.5  Albumin 3.5 - 5.0 g/dL 4.0 3.0(L) 3.3(L)  AST 15 - 41 U/L 21 14(L) 17  ALT 14 - 54 U/L 15 9(L) 11(L)  Alk Phosphatase 38 - 126 U/L 73 54 58  Total Bilirubin 0.3 - 1.2 mg/dL 0.6 0.3 0.4  Bilirubin, Direct 0.00 - 0.40 mg/dL - - -   Blood pressure medicine and blood pressure levels reviewed today with patient. Compliant with blood pressure medicine. States does not miss a dose. No obvious side effects. Blood pressure generally good when checked elsewhere. Watching salt intake.   Patient claims compliance with diabetes medication. No obvious side effects. Reports no substantial low sugar spells. Most numbers are generally in good range when checked fasting. Generally does not miss a dose of medication. Watching diabetic diet closely   Patient notes ongoing compliance with antidepressant medication. No obvious side effects. Reports does not miss a dose. Overall continues to help depression substantially. No thoughts of homicide or suicide. Would like to  maintain medication.    Review of Systems No headache, no major weight loss or weight gain, no chest pain no back pain abdominal pain no change in bowel habits complete ROS otherwise negative     Objective:   Physical Exam Alert and oriented, vitals reviewed and stable, NAD ENT-TM's and ext canals WNL bilat via otoscopic exam Soft palate, tonsils and post pharynx WNL via oropharyngeal exam Neck-symmetric, no masses; thyroid nonpalpable and nontender Pulmonary-no tachypnea or accessory muscle use; Clear without wheezes via auscultation Card--no abnrml murmurs, rhythm reg and rate WNL Carotid pulses symmetric, without bruits        Assessment & Plan:  Impression 1 chronic pain discussed need for meds meds refilled #2 diabetes good control discussed maintain same #3 asthma clinically stable discussed maintain same #4 depression clinically stable discussed plan diet exercise discussed. Medications refilled. #5 chronic anticoagulation INR good Maintain Coumadin same #6 hyperlipidemia discussed maintain same await results  Greater than 50% of this 40 minute face to face visit was spent in counseling and discussion and coordination of care regarding the above diagnosis/diagnosies

## 2016-11-29 NOTE — Patient Instructions (Signed)
Take 1/2 tablet on Sundays, and Thursdays, and then take 1 tablet on all other days

## 2016-12-06 ENCOUNTER — Encounter: Payer: Self-pay | Admitting: Nurse Practitioner

## 2016-12-06 ENCOUNTER — Ambulatory Visit: Payer: Medicare HMO | Admitting: Nurse Practitioner

## 2016-12-06 ENCOUNTER — Ambulatory Visit (INDEPENDENT_AMBULATORY_CARE_PROVIDER_SITE_OTHER): Payer: Medicare HMO | Admitting: Nurse Practitioner

## 2016-12-06 VITALS — BP 138/80 | Temp 98.8°F | Ht 61.0 in | Wt 221.0 lb

## 2016-12-06 DIAGNOSIS — E119 Type 2 diabetes mellitus without complications: Secondary | ICD-10-CM | POA: Diagnosis not present

## 2016-12-06 DIAGNOSIS — R102 Pelvic and perineal pain: Secondary | ICD-10-CM | POA: Diagnosis not present

## 2016-12-06 DIAGNOSIS — N3945 Continuous leakage: Secondary | ICD-10-CM | POA: Diagnosis not present

## 2016-12-06 DIAGNOSIS — E785 Hyperlipidemia, unspecified: Secondary | ICD-10-CM | POA: Diagnosis not present

## 2016-12-06 LAB — POCT URINALYSIS DIPSTICK
Blood, UA: NEGATIVE
LEUKOCYTES UA: NEGATIVE
pH, UA: 5 (ref 5.0–8.0)

## 2016-12-07 ENCOUNTER — Encounter: Payer: Self-pay | Admitting: Nurse Practitioner

## 2016-12-07 LAB — HEPATIC FUNCTION PANEL
ALBUMIN: 4 g/dL (ref 3.5–4.8)
ALK PHOS: 64 IU/L (ref 39–117)
ALT: 13 IU/L (ref 0–32)
AST: 18 IU/L (ref 0–40)
BILIRUBIN, DIRECT: 0.1 mg/dL (ref 0.00–0.40)
Bilirubin Total: 0.3 mg/dL (ref 0.0–1.2)
TOTAL PROTEIN: 6.8 g/dL (ref 6.0–8.5)

## 2016-12-07 LAB — BASIC METABOLIC PANEL
BUN/Creatinine Ratio: 27 (ref 12–28)
BUN: 26 mg/dL (ref 8–27)
CO2: 23 mmol/L (ref 20–29)
CREATININE: 0.98 mg/dL (ref 0.57–1.00)
Calcium: 9.5 mg/dL (ref 8.7–10.3)
Chloride: 106 mmol/L (ref 96–106)
GFR, EST AFRICAN AMERICAN: 64 mL/min/{1.73_m2} (ref >59)
GFR, EST NON AFRICAN AMERICAN: 55 mL/min/{1.73_m2} — AB (ref >59)
Glucose: 103 mg/dL — ABNORMAL HIGH (ref 65–99)
POTASSIUM: 4.8 mmol/L (ref 3.5–5.2)
SODIUM: 145 mmol/L — AB (ref 134–144)

## 2016-12-07 LAB — LIPID PANEL
CHOLESTEROL TOTAL: 172 mg/dL (ref 100–199)
Chol/HDL Ratio: 2.3 ratio (ref 0.0–4.4)
HDL: 74 mg/dL (ref >39)
LDL Calculated: 84 mg/dL (ref 0–99)
Triglycerides: 71 mg/dL (ref 0–149)
VLDL CHOLESTEROL CAL: 14 mg/dL (ref 5–40)

## 2016-12-07 LAB — MICROALBUMIN / CREATININE URINE RATIO
Creatinine, Urine: 185.7 mg/dL
MICROALBUM., U, RANDOM: 15.6 ug/mL
Microalb/Creat Ratio: 8.4 mg/g creat (ref 0.0–30.0)

## 2016-12-07 NOTE — Progress Notes (Signed)
Subjective:  Presents for c/o pressure or possible bulging in the vaginal area. Tried to insert a tampon to help constant urinary leakage based on a friend's advice. No fever or dysuria. No vaginal discharge. Tampon would not go in at all; felt like something was blocking it. Has had a total hysterectomy. No urinary control for the past 1 1/2 years. Urgency and frequency. Wears a diaper daily. History of IBS alternating diarrhea and constipation. No constipation at this time. No difficulty defecating.   Objective:   BP 138/80   Temp 98.8 F (37.1 C) (Oral)   Ht 5\' 1"  (1.549 m)   Wt 221 lb (100.2 kg)   BMI 41.76 kg/m  NAD. Alert, oriented. Lungs clear. Heart RRR. External GU: no rashes or lesions. Faint linear ecchymotic area left upper inner thigh. Patient was unaware of this. Vagina: dry and pale. Large amount of lubricant used during speculum exam. No discharge. No bulging along anterior vaginal wall with valsalva. Mild bulging along posterior vaginal wall but no masses noted. Limited bimanual exam due to abd girth.  Results for orders placed or performed in visit on 12/06/16  POCT urinalysis dipstick  Result Value Ref Range   Color, UA Yellow    Clarity, UA Clear    Glucose, UA     Bilirubin, UA     Ketones, UA     Spec Grav, UA >=1.030 (A) 1.010 - 1.025   Blood, UA Negative    pH, UA 5.0 5.0 - 8.0   Protein, UA     Urobilinogen, UA  0.2 or 1.0 E.U./dL   Nitrite, UA     Leukocytes, UA Negative Negative     Assessment:  Continuous leakage of urine - Plan: POCT urinalysis dipstick  Pelvic pressure in female    Plan:  Discussed options regarding urinary incontinence. Refer to urology for evaluation. Blockage during tampon insertion was most likely due to dry vaginal wall.  Recheck here as needed.

## 2016-12-08 ENCOUNTER — Encounter: Payer: Self-pay | Admitting: Family Medicine

## 2016-12-09 ENCOUNTER — Encounter: Payer: Self-pay | Admitting: Family Medicine

## 2016-12-21 ENCOUNTER — Other Ambulatory Visit: Payer: Self-pay | Admitting: Family Medicine

## 2016-12-27 ENCOUNTER — Ambulatory Visit: Payer: Medicare HMO

## 2016-12-31 ENCOUNTER — Ambulatory Visit: Payer: Medicare HMO

## 2017-01-01 ENCOUNTER — Ambulatory Visit (INDEPENDENT_AMBULATORY_CARE_PROVIDER_SITE_OTHER): Payer: Medicare HMO | Admitting: Family Medicine

## 2017-01-01 ENCOUNTER — Encounter: Payer: Self-pay | Admitting: Family Medicine

## 2017-01-01 VITALS — BP 118/70 | Temp 98.1°F | Ht 61.0 in | Wt 222.1 lb

## 2017-01-01 DIAGNOSIS — J45901 Unspecified asthma with (acute) exacerbation: Secondary | ICD-10-CM | POA: Diagnosis not present

## 2017-01-01 DIAGNOSIS — Z7901 Long term (current) use of anticoagulants: Secondary | ICD-10-CM | POA: Diagnosis not present

## 2017-01-01 LAB — POCT INR: INR: 2.3

## 2017-01-01 MED ORDER — PREDNISONE 20 MG PO TABS: ORAL_TABLET | ORAL | 0 refills | Status: DC

## 2017-01-01 MED ORDER — FLUTICASONE PROPIONATE HFA 220 MCG/ACT IN AERO: 2.0000 | INHALATION_SPRAY | Freq: Two times a day (BID) | RESPIRATORY_TRACT | 12 refills | Status: DC

## 2017-01-01 NOTE — Patient Instructions (Signed)
Take 1/2 tablet on Sundays, and Thursdays. Take 1 tablet all other days.

## 2017-01-01 NOTE — Progress Notes (Signed)
   Subjective:    Patient ID: [name], female    DOB: May 25, 1941, 76 y.o.   MRN: [id number]  Shortness of Breath  This is a new problem. The current episode started in the past 7 days. The symptoms are aggravated by any activity and lying flat.   Results for orders placed or performed in visit on 01/01/17  POCT INR  Result Value Ref Range   INR 2.3    Cough has not been bad, not uch coughing with it, waking up at night and coughing bad, then just quits  Patient returns once again with a flare of her chronic asthma. Increased wheezing over the last 2 weeks. Cough on occasion. Generally nonproductive. Worse at night. Having the use albuterol every 4 hours.  Claims compliance with her Flovent.  Review of Systems  Respiratory: Positive for shortness of breath.        Objective:   Physical Exam Alert and oriented, vitals reviewed and stable, NAD ENT-TM's and ext canals WNL bilat via otoscopic exam Soft palate, tonsils and post pharynx WNL via oropharyngeal exam Neck-symmetric, no masses; thyroid nonpalpable and nontender Pulmonary-no tachypnea or accessory muscle use; Though no tachypnea positive moderate wheezes via auscultation Card--no abnrml murmurs, rhythm reg and rate WNL Carotid pulses symmetric, without bruits        Assessment & Plan:  Impression further exacerbation of asthma. Shortness of breath seems to be this patient's baseline. Patient wonders if asthma specialist may help and this could be the case. Plan maintain albuterol in one form or the other every 4 hours. Maintain Flovent twice per day. Add prednisone taper. Warning signs discussed. Multiple questions answered. Asthma referral. Also INR an anticoagulant discuss and adjust appropriately  Greater than 50% of this 25 minute face to face visit was spent in counseling and discussion and coordination of care regarding the above diagnosis/diagnosies

## 2017-01-02 ENCOUNTER — Other Ambulatory Visit: Payer: Self-pay | Admitting: *Deleted

## 2017-01-02 ENCOUNTER — Telehealth: Payer: Self-pay | Admitting: Family Medicine

## 2017-01-02 MED ORDER — PREDNISONE 20 MG PO TABS: ORAL_TABLET | ORAL | 0 refills | Status: DC

## 2017-01-02 NOTE — Telephone Encounter (Signed)
Patient had Rx for fluticasone and prednisone called in yesterday to [facility].  She is needing this changed to [facility].

## 2017-01-02 NOTE — Telephone Encounter (Signed)
Pt.notified

## 2017-01-02 NOTE — Telephone Encounter (Signed)
Med changed to [place]

## 2017-01-03 ENCOUNTER — Ambulatory Visit: Payer: Medicare HMO

## 2017-01-03 ENCOUNTER — Ambulatory Visit: Payer: Medicare HMO | Admitting: Urology

## 2017-01-08 ENCOUNTER — Encounter: Payer: Self-pay | Admitting: Family Medicine

## 2017-01-23 ENCOUNTER — Telehealth: Payer: Self-pay | Admitting: Cardiovascular Disease

## 2017-01-23 NOTE — Telephone Encounter (Signed)
Was taking Diovan 80 mg which was recalled,please advise.

## 2017-01-23 NOTE — Telephone Encounter (Signed)
valsartin recall / tg

## 2017-01-23 NOTE — Telephone Encounter (Signed)
Switch to losartan 25 mg.

## 2017-01-24 ENCOUNTER — Other Ambulatory Visit: Payer: Self-pay | Admitting: Family Medicine

## 2017-01-24 ENCOUNTER — Other Ambulatory Visit: Payer: Self-pay | Admitting: Cardiovascular Disease

## 2017-01-24 ENCOUNTER — Other Ambulatory Visit: Payer: Self-pay | Admitting: Nurse Practitioner

## 2017-01-24 MED ORDER — LOSARTAN POTASSIUM 25 MG PO TABS: 25.0000 mg | ORAL_TABLET | Freq: Every day | ORAL | 3 refills | Status: DC

## 2017-01-24 NOTE — Addendum Note (Signed)
Addended by: Barbarann Ehlers A on: 01/24/2017 08:07 AM   Modules accepted: Orders

## 2017-01-24 NOTE — Telephone Encounter (Signed)
Diovan d/c'd switched to losartan, pt aware

## 2017-01-29 ENCOUNTER — Ambulatory Visit: Payer: Medicare HMO

## 2017-01-31 ENCOUNTER — Ambulatory Visit: Payer: Medicare HMO

## 2017-02-04 ENCOUNTER — Ambulatory Visit (INDEPENDENT_AMBULATORY_CARE_PROVIDER_SITE_OTHER): Payer: Medicare HMO | Admitting: *Deleted

## 2017-02-04 DIAGNOSIS — Z7901 Long term (current) use of anticoagulants: Secondary | ICD-10-CM | POA: Diagnosis not present

## 2017-02-04 LAB — POCT INR: INR: 2.3

## 2017-02-25 ENCOUNTER — Ambulatory Visit: Payer: Medicare HMO | Admitting: Family Medicine

## 2017-02-27 ENCOUNTER — Ambulatory Visit: Payer: Medicare HMO | Admitting: Family Medicine

## 2017-02-28 ENCOUNTER — Ambulatory Visit: Payer: Medicare HMO | Admitting: Family Medicine

## 2017-03-04 ENCOUNTER — Ambulatory Visit: Payer: Medicare HMO | Admitting: Family Medicine

## 2017-03-07 ENCOUNTER — Ambulatory Visit: Payer: Medicare HMO | Admitting: Urology

## 2017-03-12 ENCOUNTER — Ambulatory Visit (INDEPENDENT_AMBULATORY_CARE_PROVIDER_SITE_OTHER): Payer: Medicare HMO

## 2017-03-12 DIAGNOSIS — Z7901 Long term (current) use of anticoagulants: Secondary | ICD-10-CM

## 2017-03-12 LAB — POCT INR: INR: 2.3

## 2017-03-12 NOTE — Patient Instructions (Signed)
Take 1/2 tablet on sundays and thursdays. Take 1 whole tablet all other days. Recheck in 4 weeks.

## 2017-03-15 DIAGNOSIS — T1490XA Injury, unspecified, initial encounter: Secondary | ICD-10-CM | POA: Diagnosis not present

## 2017-03-24 ENCOUNTER — Ambulatory Visit: Payer: Medicare HMO | Admitting: Family Medicine

## 2017-04-11 ENCOUNTER — Ambulatory Visit: Payer: Medicare HMO | Admitting: Family Medicine

## 2017-04-14 ENCOUNTER — Ambulatory Visit (INDEPENDENT_AMBULATORY_CARE_PROVIDER_SITE_OTHER): Payer: Medicare HMO | Admitting: Family Medicine

## 2017-04-14 ENCOUNTER — Encounter: Payer: Self-pay | Admitting: Family Medicine

## 2017-04-14 VITALS — BP 128/74 | Ht 61.0 in | Wt 217.0 lb

## 2017-04-14 DIAGNOSIS — E785 Hyperlipidemia, unspecified: Secondary | ICD-10-CM

## 2017-04-14 DIAGNOSIS — Z23 Encounter for immunization: Secondary | ICD-10-CM

## 2017-04-14 DIAGNOSIS — E119 Type 2 diabetes mellitus without complications: Secondary | ICD-10-CM

## 2017-04-14 DIAGNOSIS — R5383 Other fatigue: Secondary | ICD-10-CM

## 2017-04-14 DIAGNOSIS — Z7901 Long term (current) use of anticoagulants: Secondary | ICD-10-CM | POA: Diagnosis not present

## 2017-04-14 LAB — POCT INR: INR: 2.9

## 2017-04-14 LAB — POCT GLYCOSYLATED HEMOGLOBIN (HGB A1C): HEMOGLOBIN A1C: 4.5

## 2017-04-14 MED ORDER — OXYCODONE HCL 10 MG PO TABS: 10.0000 mg | ORAL_TABLET | Freq: Two times a day (BID) | ORAL | 0 refills | Status: DC | PRN

## 2017-04-14 MED ORDER — DOXYCYCLINE HYCLATE 100 MG PO TABS: 100.0000 mg | ORAL_TABLET | Freq: Two times a day (BID) | ORAL | 0 refills | Status: DC

## 2017-04-14 MED ORDER — HYDROCORTISONE 2.5 % EX CREA: TOPICAL_CREAM | Freq: Two times a day (BID) | CUTANEOUS | 0 refills | Status: DC

## 2017-04-14 NOTE — Progress Notes (Signed)
   Subjective:    Patient ID: [name], female    DOB: 02/09/1941, 76 y.o.   MRN: [id number]  HPIDiabetes type 2. Pt does not know how to use glucose monitor and has not been checking sugar.  Results for orders placed or performed in visit on 04/14/17  POCT INR  Result Value Ref Range   INR 2.9   POCT glycosylated hemoglobin (Hb A1C)  Result Value Ref Range   Hemoglobin A1C 4.5     Patient claims compliance with diabetes medication. No obvious side effects. Reports no substantial low sugar spells. Most numbers are generally in good range when checked fasting. Generally does not miss a dose of medication. Watching diabetic diet closely  Blood pressure medicine and blood pressure levels reviewed today with patient. Compliant with blood pressure medicine. States does not miss a dose. No obvious side effects. Blood pressure generally good when checked elsewhere. Watching salt intake.  Patient also arrives for an INR and Coumadin since Patient also arrives for evaluation Last eye exam was 3 years ago.  INR is INR today 2.9  A1C 4.5.  Patient notes ongoing substantial fatigue   Review of Systems No headache, no major weight loss or weight gain, no chest pain no back pain abdominal pain no change in bowel habits complete ROS otherwise negative     Objective:   Physical Exam  Alert and oriented, vitals reviewed and stable, NAD ENT-TM's and ext canals WNL bilat via otoscopic exam Soft palate, tonsils and post pharynx WNL via oropharyngeal exam Neck-symmetric, no masses; thyroid nonpalpable and nontender Pulmonary-no tachypnea or accessory muscle use; Clear without wheezes via auscultation Card--no abnrml murmurs, rhythm reg and rate WNL Carotid pulses symmetric, without bruits       Assessment & Plan:  Impression 1 type 2 diabetes control.  On the tight side.  Discussed #2 hyperlipidemia.  Current status uncertain will check  3.  Hypertension good control discussed to  maintain same  4.  Fatigue.  Ongoing.  Patient has around 15 chronic medical problems so unfortunately not surprising that fatigue is present.  Will do screening blood work.  Discussed with patient  Greater than 50% of this 25 minute face to face visit was spent in counseling and discussion and coordination of care regarding the above diagnosis/diagnosies

## 2017-04-14 NOTE — Patient Instructions (Signed)
Take coumadin 5mg  tablets. one half tablet on Sunday and Thursday , and one tablet all other days. Recheck INR 4 weeks

## 2017-04-15 LAB — CBC WITH DIFFERENTIAL/PLATELET
Basophils Absolute: 0 10*3/uL (ref 0.0–0.2)
Basos: 0 %
EOS (ABSOLUTE): 0.4 10*3/uL (ref 0.0–0.4)
Eos: 4 %
HEMOGLOBIN: 12.9 g/dL (ref 11.1–15.9)
Hematocrit: 40.3 % (ref 34.0–46.6)
IMMATURE GRANS (ABS): 0 10*3/uL (ref 0.0–0.1)
IMMATURE GRANULOCYTES: 0 %
LYMPHS: 18 %
Lymphocytes Absolute: 1.7 10*3/uL (ref 0.7–3.1)
MCH: 27 pg (ref 26.6–33.0)
MCHC: 32 g/dL (ref 31.5–35.7)
MCV: 84 fL (ref 79–97)
MONOCYTES: 8 %
Monocytes Absolute: 0.7 10*3/uL (ref 0.1–0.9)
NEUTROS ABS: 6.4 10*3/uL (ref 1.4–7.0)
NEUTROS PCT: 70 %
Platelets: 295 10*3/uL (ref 150–379)
RBC: 4.78 x10E6/uL (ref 3.77–5.28)
RDW: 15.6 % — ABNORMAL HIGH (ref 12.3–15.4)
WBC: 9.2 10*3/uL (ref 3.4–10.8)

## 2017-04-15 LAB — BASIC METABOLIC PANEL
BUN/Creatinine Ratio: 16 (ref 12–28)
BUN: 16 mg/dL (ref 8–27)
CALCIUM: 9.6 mg/dL (ref 8.7–10.3)
CHLORIDE: 103 mmol/L (ref 96–106)
CO2: 24 mmol/L (ref 20–29)
Creatinine, Ser: 0.99 mg/dL (ref 0.57–1.00)
GFR calc Af Amer: 63 mL/min/{1.73_m2} (ref >59)
GFR, EST NON AFRICAN AMERICAN: 55 mL/min/{1.73_m2} — AB (ref >59)
Glucose: 96 mg/dL (ref 65–99)
POTASSIUM: 4.7 mmol/L (ref 3.5–5.2)
Sodium: 144 mmol/L (ref 134–144)

## 2017-04-15 LAB — HEPATIC FUNCTION PANEL
ALBUMIN: 4.2 g/dL (ref 3.5–4.8)
ALT: 10 IU/L (ref 0–32)
AST: 22 IU/L (ref 0–40)
Alkaline Phosphatase: 78 IU/L (ref 39–117)
BILIRUBIN TOTAL: 0.4 mg/dL (ref 0.0–1.2)
Bilirubin, Direct: 0.18 mg/dL (ref 0.00–0.40)
Total Protein: 6.8 g/dL (ref 6.0–8.5)

## 2017-04-15 LAB — LIPID PANEL
CHOL/HDL RATIO: 2.6 ratio (ref 0.0–4.4)
CHOLESTEROL TOTAL: 151 mg/dL (ref 100–199)
HDL: 59 mg/dL (ref >39)
LDL CALC: 72 mg/dL (ref 0–99)
Triglycerides: 102 mg/dL (ref 0–149)
VLDL CHOLESTEROL CAL: 20 mg/dL (ref 5–40)

## 2017-04-15 LAB — TSH: TSH: 1.85 u[IU]/mL (ref 0.450–4.500)

## 2017-04-22 ENCOUNTER — Encounter: Payer: Self-pay | Admitting: Family Medicine

## 2017-04-28 ENCOUNTER — Other Ambulatory Visit: Payer: Self-pay | Admitting: Family Medicine

## 2017-05-05 ENCOUNTER — Telehealth: Payer: Self-pay | Admitting: Family Medicine

## 2017-05-05 ENCOUNTER — Emergency Department (HOSPITAL_COMMUNITY)
Admission: EM | Admit: 2017-05-05 | Discharge: 2017-05-05 | Disposition: A | Discharge status: Home / Self Care | Payer: Medicare HMO | Attending: Emergency Medicine | Admitting: Emergency Medicine

## 2017-05-05 ENCOUNTER — Other Ambulatory Visit: Payer: Self-pay | Admitting: Nurse Practitioner

## 2017-05-05 ENCOUNTER — Emergency Department (HOSPITAL_COMMUNITY): Payer: Medicare HMO

## 2017-05-05 ENCOUNTER — Encounter (HOSPITAL_COMMUNITY): Payer: Self-pay | Admitting: Cardiology

## 2017-05-05 ENCOUNTER — Other Ambulatory Visit: Payer: Self-pay | Admitting: Family Medicine

## 2017-05-05 DIAGNOSIS — Z87891 Personal history of nicotine dependence: Secondary | ICD-10-CM | POA: Insufficient documentation

## 2017-05-05 DIAGNOSIS — Z7901 Long term (current) use of anticoagulants: Secondary | ICD-10-CM | POA: Insufficient documentation

## 2017-05-05 DIAGNOSIS — N182 Chronic kidney disease, stage 2 (mild): Secondary | ICD-10-CM | POA: Insufficient documentation

## 2017-05-05 DIAGNOSIS — E1140 Type 2 diabetes mellitus with diabetic neuropathy, unspecified: Secondary | ICD-10-CM | POA: Diagnosis not present

## 2017-05-05 DIAGNOSIS — I129 Hypertensive chronic kidney disease with stage 1 through stage 4 chronic kidney disease, or unspecified chronic kidney disease: Secondary | ICD-10-CM | POA: Diagnosis not present

## 2017-05-05 DIAGNOSIS — Z79899 Other long term (current) drug therapy: Secondary | ICD-10-CM | POA: Diagnosis not present

## 2017-05-05 DIAGNOSIS — J4541 Moderate persistent asthma with (acute) exacerbation: Secondary | ICD-10-CM | POA: Insufficient documentation

## 2017-05-05 DIAGNOSIS — E039 Hypothyroidism, unspecified: Secondary | ICD-10-CM | POA: Diagnosis not present

## 2017-05-05 DIAGNOSIS — I2510 Atherosclerotic heart disease of native coronary artery without angina pectoris: Secondary | ICD-10-CM | POA: Insufficient documentation

## 2017-05-05 DIAGNOSIS — J189 Pneumonia, unspecified organism: Secondary | ICD-10-CM | POA: Insufficient documentation

## 2017-05-05 DIAGNOSIS — R0602 Shortness of breath: Secondary | ICD-10-CM | POA: Diagnosis not present

## 2017-05-05 DIAGNOSIS — R069 Unspecified abnormalities of breathing: Secondary | ICD-10-CM | POA: Diagnosis not present

## 2017-05-05 LAB — COMPREHENSIVE METABOLIC PANEL
ALBUMIN: 3.6 g/dL (ref 3.5–5.0)
ALT: 13 U/L — ABNORMAL LOW (ref 14–54)
ANION GAP: 5 (ref 5–15)
AST: 21 U/L (ref 15–41)
Alkaline Phosphatase: 59 U/L (ref 38–126)
BILIRUBIN TOTAL: 0.4 mg/dL (ref 0.3–1.2)
BUN: 15 mg/dL (ref 6–20)
CALCIUM: 9.2 mg/dL (ref 8.9–10.3)
CHLORIDE: 108 mmol/L (ref 101–111)
CO2: 29 mmol/L (ref 22–32)
Creatinine, Ser: 0.64 mg/dL (ref 0.44–1.00)
GFR calc Af Amer: >60 mL/min (ref >60)
GFR calc non Af Amer: >60 mL/min (ref >60)
GLUCOSE: 116 mg/dL — AB (ref 65–99)
POTASSIUM: 3.4 mmol/L — AB (ref 3.5–5.1)
Sodium: 142 mmol/L (ref 135–145)
TOTAL PROTEIN: 6.6 g/dL (ref 6.5–8.1)

## 2017-05-05 LAB — CBC WITH DIFFERENTIAL/PLATELET
BASOS ABS: 0 10*3/uL (ref 0.0–0.1)
BASOS PCT: 0 %
EOS ABS: 0.4 10*3/uL (ref 0.0–0.7)
Eosinophils Relative: 4 %
HCT: 37.5 % (ref 36.0–46.0)
Hemoglobin: 11.8 g/dL — ABNORMAL LOW (ref 12.0–15.0)
Lymphocytes Relative: 18 %
Lymphs Abs: 1.6 10*3/uL (ref 0.7–4.0)
MCH: 27.5 pg (ref 26.0–34.0)
MCHC: 31.5 g/dL (ref 30.0–36.0)
MCV: 87.4 fL (ref 78.0–100.0)
MONO ABS: 0.6 10*3/uL (ref 0.1–1.0)
MONOS PCT: 7 %
Neutro Abs: 6.1 10*3/uL (ref 1.7–7.7)
Neutrophils Relative %: 71 %
Platelets: 254 10*3/uL (ref 150–400)
RBC: 4.29 MIL/uL (ref 3.87–5.11)
RDW: 16.1 % — AB (ref 11.5–15.5)
WBC: 8.7 10*3/uL (ref 4.0–10.5)

## 2017-05-05 LAB — PROTIME-INR
INR: 2.15
PROTHROMBIN TIME: 23.8 s — AB (ref 11.4–15.2)

## 2017-05-05 LAB — TROPONIN I

## 2017-05-05 LAB — BRAIN NATRIURETIC PEPTIDE: B Natriuretic Peptide: 211 pg/mL — ABNORMAL HIGH (ref 0.0–100.0)

## 2017-05-05 MED ORDER — LEVALBUTEROL HCL 1.25 MG/0.5ML IN NEBU
1.2500 mg | INHALATION_SOLUTION | Freq: Once | RESPIRATORY_TRACT | Status: AC
  Administered 2017-05-05: 1.25 mg via RESPIRATORY_TRACT
  Filled 2017-05-05: qty 0.5

## 2017-05-05 MED ORDER — PREDNISONE 20 MG PO TABS: ORAL_TABLET | ORAL | 0 refills | Status: DC

## 2017-05-05 MED ORDER — METHYLPREDNISOLONE SODIUM SUCC 125 MG IJ SOLR
125.0000 mg | Freq: Once | INTRAMUSCULAR | Status: AC
  Administered 2017-05-05: 125 mg via INTRAVENOUS
  Filled 2017-05-05: qty 2

## 2017-05-05 MED ORDER — LEVOFLOXACIN 500 MG PO TABS: 500.0000 mg | ORAL_TABLET | Freq: Every day | ORAL | 0 refills | Status: DC

## 2017-05-05 MED ORDER — LEVALBUTEROL HCL 0.63 MG/3ML IN NEBU
0.6300 mg | INHALATION_SOLUTION | Freq: Once | RESPIRATORY_TRACT | Status: AC
  Administered 2017-05-05: 0.63 mg via RESPIRATORY_TRACT
  Filled 2017-05-05: qty 3

## 2017-05-05 MED ORDER — LEVOFLOXACIN IN D5W 500 MG/100ML IV SOLN
500.0000 mg | Freq: Once | INTRAVENOUS | Status: AC
  Administered 2017-05-05: 500 mg via INTRAVENOUS
  Filled 2017-05-05: qty 100

## 2017-05-05 NOTE — ED Triage Notes (Signed)
SOB times 2 weeks.  Pt states she feels like she can't take a deep breath.

## 2017-05-05 NOTE — ED Notes (Signed)
Pt ambulated with RA sats any where between 94-100% per NT Charlene Brooke, pt tolerated ambulation while.

## 2017-05-05 NOTE — Telephone Encounter (Signed)
Patient called stating that she has been feeling SOB at rest and on exertion and has had to use her fast acting inhaler every 2 hours with no relief. Discussed with Dr.Steve and was told to inform the patient to go to ER for further evaluation. Patient verbalized understanding.

## 2017-05-05 NOTE — ED Provider Notes (Signed)
[facility] EMERGENCY DEPARTMENT Provider Note   CSN: [id number] Arrival date & time: 05/05/17  1536     History   Chief Complaint Chief Complaint  Patient presents with  . Shortness of Breath    HPI [name] is a 76 y.o. female.  HPI Patient presents with increased dyspnea especially with exertion and lying flat worsening over the last 2 weeks.  She had a nonproductive cough.  Also states she has had increased bilateral lower extremity swelling.  No fever but does endorse chills.  Denies chest pain.  Patient states she is been compliant with her medication.  She has had increased wheezing at home and has been using her nebulizer. Past Medical History:  Diagnosis Date  . Allergy   . Allergy history unknown   . Arthritis   . ASCVD (arteriosclerotic cardiovascular disease)    stent to mid and proximal left anterior descending in 06/2002;drug eluting stent placed in the second diagnol in 08/2003 after  A  non-st elevation myocardial infarction   . Asthma   . Atrial fibrillation ([place])   . Chronic anticoagulation   . Chronic nausea   . Chronic pain of left knee   . Coronary artery disease   . Current chronic use of systemic steroids 05/11/2016  . Depression   . Diabetes mellitus    no insulin  . Diarrhea    acute  . FH: colonic polyps    adenomataous  . GERD (gastroesophageal reflux disease)   . History of kidney stones   . Hyperlipidemia    pulmonary embolism 2000 and 09/2008  . Hypertension   . Hypothyroidism   . Insomnia   . Irritable bowel syndrome   . Myocardial infarction ([place])   . Paroxysmal atrial fibrillation ([facility])    normal LV function; episodes occurred in 205 and 09/2007  . Pedal edema   . Peripheral edema   . PONV (postoperative nausea and vomiting)   . Pulmonary embolism ([place])    2000/09/2008  . Rectal bleeding   . Sleep apnea   . Thyroid disease    hypothyroidism  . Tobacco user    stopped   . Venous stasis     Patient Active Problem List    Diagnosis Date Noted  . Nausea vomiting and diarrhea 05/11/2016  . Obesity 05/11/2016  . 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 Insomnia 04/15/2016  . Hyperkalemia 04/08/2016  . Volume depletion 04/08/2016  . AKI (acute kidney injury) ([place]) 04/08/2016  . Depression 04/07/2016  . Nausea without vomiting 05/09/2014  . Melena 05/08/2014  . UGI bleed 05/08/2014  . Dysphagia, unspecified(787.20) 10/26/2013  . Difficulty in walking(719.7) 10/19/2012  . Weakness of left leg 10/19/2012  . Long term current use of anticoagulant therapy 09/26/2012  . Dysphagia 05/04/2012  . Microcytic anemia 08/10/2011  . Acute respiratory failure ([place]) 08/09/2011  . IBS (irritable bowel syndrome) 08/09/2011  . DOE (dyspnea on exertion) 03/05/2011  . Anemia 03/05/2011  . Peripheral neuropathy 10/02/2010  . 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 DIARRHEA, ACUTE 12/05/2008  . Hypothyroidism 12/02/2008  . Controlled type 2 diabetes mellitus ([place]) 12/02/2008  . Hyperlipidemia LDL goal <70 12/02/2008  . Essential hypertension 12/02/2008  . Coronary atherosclerosis 12/02/2008  . Chronic atrial fibrillation ([place]) 12/02/2008  . Asthma 12/02/2008  . GERD 12/02/2008  . TOBACCO USE, QUIT 12/02/2008    Past Surgical History:  Procedure Laterality Date  . ABDOMINAL HYSTERECTOMY    .  APPENDECTOMY    . BREAST REDUCTION SURGERY    . BREAST SURGERY    . CHOLECYSTECTOMY    . CLEFT PALATE REPAIR     4 surgeries  . COLONOSCOPY  2011   negative  . HEEL SPUR SURGERY    . KNEE SURGERY    . OOPHORECTOMY    . TONSILLECTOMY      OB History    No data available       Home Medications    Prior to Admission medications   Medication Sig Start Date End Date Taking? Authorizing Provider  acetaminophen (TYLENOL) 500 MG tablet Take 500 mg by mouth daily as needed for headache.    [provider]  albuterol (PROVENTIL) (2.5 MG/3ML) 0.083% nebulizer  solution Take 3 mLs (2.5 mg total) by nebulization every 6 (six) hours as needed for wheezing or shortness of breath. 06/05/16   Mikey Kirschner, MD  atorvastatin (LIPITOR) 40 MG tablet TAKE ONE TABLET BY MOUTH DAILY. 01/24/17   Herminio Commons, MD  Blood Glucose Monitoring Suppl w/Device KIT Please dispense Glucometer, Strips and all testing supplies per patient's preference or insurance.Test Glucose 1 time daily. Dx: E11.9 06/05/16   Mikey Kirschner, MD  Cholecalciferol (VITAMIN D3 PO) Take 1 tablet by mouth every evening.     [provider]  diltiazem (CARDIZEM CD) 180 MG 24 hr capsule TAKE ONE CAPSULE BY MOUTH DAILY. 08/15/16   Herminio Commons, MD  diphenoxylate-atropine (LOMOTIL) 2.5-0.025 MG tablet TAKE ONE TABLET BY MOUTH 2 TIMES A DAY AS NEEDED. 05/05/17   Nilda Simmer, NP  doxycycline (VIBRA-TABS) 100 MG tablet Take 1 tablet (100 mg total) by mouth 2 (two) times daily. 04/14/17   Mikey Kirschner, MD  escitalopram (LEXAPRO) 20 MG tablet TAKE ONE TABLET BY MOUTH ONCE DAILY. 01/24/17   Nilda Simmer, NP  fluticasone (FLOVENT HFA) 220 MCG/ACT inhaler Inhale 2 puffs into the lungs 2 (two) times daily. 01/01/17   Mikey Kirschner, MD  hydrocortisone 2.5 % cream Apply topically 2 (two) times daily. 04/14/17   Mikey Kirschner, MD  levothyroxine (SYNTHROID, LEVOTHROID) 50 MCG tablet TAKE 1 TABLET BY MOUTH ONCE DAILY. 04/28/17   Mikey Kirschner, MD  losartan (COZAAR) 25 MG tablet Take 1 tablet (25 mg total) by mouth daily. 01/24/17 04/24/17  Herminio Commons, MD  nitroGLYCERIN (NITROLINGUAL) 0.4 MG/SPRAY spray Place 1 spray under the tongue every 5 (five) minutes x 3 doses as needed for chest pain. 06/04/16   Herminio Commons, MD  ondansetron (ZOFRAN-ODT) 4 MG disintegrating tablet DISSOLVE ONE TABLET BY MOUTH TWICE DAILY AS NEEDED. 10/28/16   Setzer, Rona Ravens, NP  Oxycodone HCl 10 MG TABS Take 1 tablet (10 mg total) by mouth 2 (two) times daily as needed (FOR PAIN).  04/14/17   Mikey Kirschner, MD  pantoprazole (PROTONIX) 40 MG tablet Take 1 tablet (40 mg total) by mouth 2 (two) times daily before a meal. 02/23/16   Setzer, Rona Ravens, NP  PROAIR HFA 108 (90 Base) MCG/ACT inhaler INHALE 2 PUFFS INTO THE LUNGS 4 TIMES DAILY AS NEEDED FOR WHEEZING. 11/19/16   Mikey Kirschner, MD  promethazine (PHENERGAN) 12.5 MG tablet TAKE (1) TABLET BY MOUTH EVERY SIX HOURS AS NEEDED FOR NAUSEA/VOMITING. 05/05/17   Mikey Kirschner, MD  warfarin (COUMADIN) 5 MG tablet TAKE ONE TABLET BY MOUTH ONCE DAILY. TAKE AS DIRECTED BY DOCTOR. 01/24/17   Nilda Simmer, NP    Family History  Family History  Problem Relation Age of Onset  . Heart attack Mother   . Diabetes Mother   . Hyperlipidemia Mother   . Heart attack Father   . Lung cancer Sister   . Lymphoma Brother   . Colon cancer Neg Hx     Social History Social History   Tobacco Use  . Smoking status: Former Smoker    Packs/day: 2.00    Years: 30.00    Pack years: 60.00    Types: Cigarettes    Start date: 05/05/1987  . Smokeless tobacco: Never Used  Substance Use Topics  . Alcohol use: No    Alcohol/week: 0.0 oz  . Drug use: No     Allergies   Cefzil [cefprozil] and Demerol   Review of Systems Review of Systems  Constitutional: Positive for chills. Negative for fever.  HENT: Negative for trouble swallowing and voice change.   Respiratory: Positive for cough, shortness of breath and wheezing.   Cardiovascular: Positive for leg swelling. Negative for chest pain and palpitations.  Gastrointestinal: Negative for abdominal pain, diarrhea, nausea and vomiting.  Musculoskeletal: Negative for back pain, myalgias and neck pain.  Skin: Negative for rash.  Neurological: Negative for dizziness, weakness, light-headedness, numbness and headaches.  All other systems reviewed and are negative.    Physical Exam Updated Vital Signs BP (!) 166/108 (BP Location: Right Arm)   Pulse 71   Temp 98.7 F (37.1 C)  (Oral)   Resp (!) 25   Ht 5' 1" (1.549 m)   Wt 98.4 kg (217 lb)   SpO2 97%   BMI 41.00 kg/m   Physical Exam  Constitutional: She is oriented to person, place, and time. She appears well-developed and well-nourished.  Non-toxic appearance. She does not appear ill. No distress.  HENT:  Head: Normocephalic and atraumatic.  Mouth/Throat: Oropharynx is clear and moist.  Eyes: EOM are normal. Pupils are equal, round, and reactive to light.  Neck: Normal range of motion. Neck supple.  Cardiovascular: Normal rate and regular rhythm.  Pulmonary/Chest: Effort normal. Tachypnea noted. She has decreased breath sounds. She has wheezes.  Abdominal: Soft. Bowel sounds are normal. There is no tenderness. There is no rebound and no guarding.  Musculoskeletal: Normal range of motion. She exhibits no tenderness.       Right lower leg: She exhibits edema.       Left lower leg: She exhibits edema.  2+ bilateral lower extremity pitting edema.  Neurological: She is alert and oriented to person, place, and time.  Skin: Skin is warm and dry. Capillary refill takes less than 2 seconds. No rash noted. No erythema.  Psychiatric: She has a normal mood and affect. Her behavior is normal.  Nursing note and vitals reviewed.    ED Treatments / Results  Labs (all labs ordered are listed, but only abnormal results are displayed) Labs Reviewed  CBC WITH DIFFERENTIAL/PLATELET  COMPREHENSIVE METABOLIC PANEL  BRAIN NATRIURETIC PEPTIDE  PROTIME-INR  TROPONIN I    EKG  EKG Interpretation None       Radiology Dg Chest 2 View  Result Date: 05/05/2017 CLINICAL DATA:  Two weeks of shortness of breath, occasionally fills like she cannot take a deep breath. History of asthma, coronary artery disease with stent placement, hypertension, pulmonary embolism, former smoker. EXAM: CHEST  2 VIEW COMPARISON:  Portable chest x-ray of May 10, 2016 and PA and lateral chest x-ray of August 17, 2015. FINDINGS: The  lungs are well-expanded. There is increased density in  the right infrahilar region obscuring portions of the right hemidiaphragm anteriorly. The left lung is clear. There is no pleural effusion. The cardiac silhouette is enlarged. There is calcification in the wall of the aortic arch. The mediastinum is normal in width. There are wedge compressions of upper and lower thoracic vertebral bodies which appears stable since the chest x-ray of August 17, 2015. IMPRESSION: Infiltrate or atelectasis in the right middle lobe anteriorly. This is more conspicuous than in the past. Stable underlying changes consistent with COPD and/or reactive airway disease. Followup PA and lateral chest X-ray is recommended in 3-4 weeks following trial of antibiotic therapy to ensure resolution and exclude underlying malignancy. Electronically Signed   By: David  Martinique M.D.   On: 05/05/2017 16:13    Procedures Procedures (including critical care time)  Medications Ordered in ED Medications  levalbuterol (XOPENEX) nebulizer solution 0.63 mg (not administered)  methylPREDNISolone sodium succinate (SOLU-MEDROL) 125 mg/2 mL injection 125 mg (not administered)     Initial Impression / Assessment and Plan / ED Course  I have reviewed the triage vital signs and the nursing notes.  Pertinent labs & imaging results that were available during my care of the patient were reviewed by me and considered in my medical decision making (see chart for details).    She states she is feeling much better after nebulizer treatment and steroids.  Given dose of IV Levaquin.  She is ambulating without difficulty and maintaining oxygen saturations.  Discussed admission versus discharge home.  States she would like to try going home.  She understands need to return immediately for any worsening of her symptoms or concerns.   Final Clinical Impressions(s) / ED Diagnoses   Final diagnoses:  None    ED Discharge Orders    None         Julianne Rice, MD 05/06/17 2250

## 2017-05-12 ENCOUNTER — Ambulatory Visit: Payer: Medicare HMO | Admitting: Family Medicine

## 2017-05-13 ENCOUNTER — Ambulatory Visit: Payer: Medicare HMO

## 2017-05-13 ENCOUNTER — Ambulatory Visit: Payer: Medicare HMO | Admitting: Family Medicine

## 2017-05-14 ENCOUNTER — Encounter: Payer: Self-pay | Admitting: Family Medicine

## 2017-05-14 ENCOUNTER — Ambulatory Visit (INDEPENDENT_AMBULATORY_CARE_PROVIDER_SITE_OTHER): Payer: Medicare HMO | Admitting: Family Medicine

## 2017-05-14 VITALS — BP 118/76 | Temp 98.5°F | Ht 61.0 in | Wt 219.0 lb

## 2017-05-14 DIAGNOSIS — E119 Type 2 diabetes mellitus without complications: Secondary | ICD-10-CM | POA: Diagnosis not present

## 2017-05-14 DIAGNOSIS — J45901 Unspecified asthma with (acute) exacerbation: Secondary | ICD-10-CM

## 2017-05-14 DIAGNOSIS — Z7901 Long term (current) use of anticoagulants: Secondary | ICD-10-CM | POA: Diagnosis not present

## 2017-05-14 LAB — POCT INR: INR: 4.1

## 2017-05-14 MED ORDER — ESCITALOPRAM OXALATE 20 MG PO TABS: 20.0000 mg | ORAL_TABLET | Freq: Every day | ORAL | 2 refills | Status: DC

## 2017-05-14 NOTE — Patient Instructions (Signed)
Please stop coumadin today and tomorrow

## 2017-05-14 NOTE — Progress Notes (Signed)
   Subjective:    Patient ID: [name], female    DOB: 25-Nov-1940, 76 y.o.   MRN: [id number]  HPIFollow up ER visit for pneumonia. Finished levaquin yesterday. Still having sob. O2 95.   Lost bottle of lexapro. Has been out of med for about one and a half weeks.  Has noticed the depression worsening.  Also some anxiety.  Definitely feels she needs to benefit from the medication.  Patient claims compliance with Coumadin.  Recently seen in the emergency room and on antibiotics.  Until recently.  Felt to have exacerbation of asthma and bronchitis Results for orders placed or performed in visit on 05/14/17  POCT INR  Result Value Ref Range   INR 4.1    A1C done last month 4.5.    Fatigue and tireness ongoing.  Patient brings up once again disabled after our last discussion in this regard.  Out of lexapro     Review of Systems No headache, no major weight loss or weight gain, no chest pain no back pain abdominal pain no change in bowel habits complete ROS otherwise negative     Objective:   Physical Exam  Alert and oriented, vitals reviewed and stable, NAD ENT-TM's and ext canals WNL bilat via otoscopic exam Soft palate, tonsils and post pharynx WNL via oropharyngeal exam Neck-symmetric, no masses; thyroid nonpalpable and nontender Pulmonary-no tachypnea or accessory muscle use; Clear without wheezes via auscultation Card--no abnrml murmurs, rhythm reg and rate WNL Carotid pulses symmetric, without bruits      Assessment & Plan:  Impression status post exacerbation of asthma with infectious component.  Clinically improved.  2.  Depression ongoing.  With recent noncompliance with meds.  Definitely lead to worsening of symptoms  3.  Fatigue ongoing and profound.  Patient likely will have element of this for rest of her life.  Discussed.  4.  Chronic anticoagulation.  INR elevated discussed approach discussed see Coumadin sheet  Greater than 50% of this 25 minute face to  face visit was spent in counseling and discussion and coordination of care regarding the above diagnosis/diagnosies

## 2017-05-16 DIAGNOSIS — R2681 Unsteadiness on feet: Secondary | ICD-10-CM | POA: Diagnosis not present

## 2017-05-16 DIAGNOSIS — R269 Unspecified abnormalities of gait and mobility: Secondary | ICD-10-CM | POA: Diagnosis not present

## 2017-05-16 DIAGNOSIS — M25562 Pain in left knee: Secondary | ICD-10-CM | POA: Diagnosis not present

## 2017-05-29 ENCOUNTER — Other Ambulatory Visit: Payer: Self-pay | Admitting: Family Medicine

## 2017-06-11 ENCOUNTER — Ambulatory Visit (INDEPENDENT_AMBULATORY_CARE_PROVIDER_SITE_OTHER): Payer: Medicare HMO | Admitting: *Deleted

## 2017-06-11 DIAGNOSIS — Z7901 Long term (current) use of anticoagulants: Secondary | ICD-10-CM | POA: Diagnosis not present

## 2017-06-11 LAB — POCT INR: INR: 2

## 2017-06-12 ENCOUNTER — Other Ambulatory Visit: Payer: Self-pay | Admitting: Family Medicine

## 2017-06-20 ENCOUNTER — Ambulatory Visit: Payer: Medicare HMO | Admitting: Family Medicine

## 2017-07-02 ENCOUNTER — Other Ambulatory Visit: Payer: Self-pay | Admitting: Nurse Practitioner

## 2017-07-15 ENCOUNTER — Ambulatory Visit: Payer: Medicare HMO | Admitting: Family Medicine

## 2017-07-18 ENCOUNTER — Ambulatory Visit: Payer: Medicare HMO | Admitting: Cardiovascular Disease

## 2017-07-18 ENCOUNTER — Ambulatory Visit: Payer: Medicare HMO | Admitting: Family Medicine

## 2017-07-29 ENCOUNTER — Ambulatory Visit (INDEPENDENT_AMBULATORY_CARE_PROVIDER_SITE_OTHER): Payer: Medicare HMO | Admitting: Family Medicine

## 2017-07-29 ENCOUNTER — Encounter: Payer: Self-pay | Admitting: Family Medicine

## 2017-07-29 VITALS — BP 122/82 | Ht 61.0 in | Wt 219.0 lb

## 2017-07-29 DIAGNOSIS — E119 Type 2 diabetes mellitus without complications: Secondary | ICD-10-CM

## 2017-07-29 DIAGNOSIS — Z7901 Long term (current) use of anticoagulants: Secondary | ICD-10-CM

## 2017-07-29 DIAGNOSIS — E039 Hypothyroidism, unspecified: Secondary | ICD-10-CM | POA: Diagnosis not present

## 2017-07-29 DIAGNOSIS — J45901 Unspecified asthma with (acute) exacerbation: Secondary | ICD-10-CM

## 2017-07-29 LAB — POCT INR: INR: 1.9

## 2017-07-29 MED ORDER — OXYCODONE HCL 10 MG PO TABS: 10.0000 mg | ORAL_TABLET | Freq: Two times a day (BID) | ORAL | 0 refills | Status: DC | PRN

## 2017-07-29 NOTE — Progress Notes (Signed)
   Subjective:    Patient ID: [name], female    DOB: [birth year], 77 y.o.   MRN: [id number]  Hyperlipidemia  This is a chronic problem. The current episode started more than 1 year ago. Treatments tried: lipior. There are no compliance problems.  Risk factors for coronary artery disease include diabetes mellitus, dyslipidemia, hypertension, post-menopausal and obesity.   Patient would like a prescription for prednisone and oxycodone for her pain  Results for orders placed or performed in visit on 07/29/17  POCT INR  Result Value Ref Range   INR 1.9     Patient intermittently will have severe joint pain.  Bad enough to take something stronger for it and over-the-counter.  Requests refill on oxycodone.  Also prior diagnosis of polymyalgia rheumatica.  At one point patient was on steroids.  Rheumatologist though at one point felt the diagnosis was not accurate and recommended no further prednisone.  Patient claims compliance with diabetes medication. No obvious side effects. Reports no substantial low sugar spells. Most numbers are generally in good range when checked fasting. Generally does not miss a dose of medication. Watching diabetic diet closely  asthma Asthma overall stable and later doing well.  Rare use of rescue inhaler.  Uses preventive agent faithfully.  No major side effects with it.  Trying to get some walking in.  Avoiding smoke exposure   currently on coumadin 5 mg 1/2 tablet on Sunday and Thursday and one tablet all other days.  Patient claims compliance with medication.  No obvious bleeding difficulties Review of Systems No headache, no major weight loss or weight gain, no chest pain no back pain abdominal pain no change in bowel habits complete ROS otherwise negative     Objective:   Physical Exam  Alert and oriented, vitals reviewed and stable, NAD ENT-TM's and ext canals WNL bilat via otoscopic exam Soft palate, tonsils and post pharynx WNL via oropharyngeal  exam Neck-symmetric, no masses; thyroid nonpalpable and nontender Pulmonary-no tachypnea or accessory muscle use; Clear without wheezes via auscultation Card--no abnrml murmurs, rhythm reg and rate WNL Carotid pulses symmetric, without bruits       Assessment & Plan:  Impression 1 type 2 diabetes controlled decent discussed to maintain same approach  2.  Asthma clinically stable at this time.  Recommend maintaining same therapy  3.  Chronic anticoagulation.  INR noted and Coumadin adjusted where appropriate  4.  Chronic fatigue ongoing due to her 15 or so medical problems  5.  Chronic pain intermittent pain medicine prescribed symptom care discussed

## 2017-07-31 ENCOUNTER — Other Ambulatory Visit: Payer: Self-pay | Admitting: Nurse Practitioner

## 2017-07-31 DIAGNOSIS — I4891 Unspecified atrial fibrillation: Secondary | ICD-10-CM | POA: Diagnosis not present

## 2017-07-31 DIAGNOSIS — I25119 Atherosclerotic heart disease of native coronary artery with unspecified angina pectoris: Secondary | ICD-10-CM | POA: Diagnosis not present

## 2017-07-31 DIAGNOSIS — E039 Hypothyroidism, unspecified: Secondary | ICD-10-CM | POA: Diagnosis not present

## 2017-07-31 DIAGNOSIS — R69 Illness, unspecified: Secondary | ICD-10-CM | POA: Diagnosis not present

## 2017-07-31 DIAGNOSIS — I10 Essential (primary) hypertension: Secondary | ICD-10-CM | POA: Diagnosis not present

## 2017-07-31 DIAGNOSIS — G8929 Other chronic pain: Secondary | ICD-10-CM | POA: Diagnosis not present

## 2017-07-31 DIAGNOSIS — Z6841 Body Mass Index (BMI) 40.0 and over, adult: Secondary | ICD-10-CM | POA: Diagnosis not present

## 2017-07-31 DIAGNOSIS — E785 Hyperlipidemia, unspecified: Secondary | ICD-10-CM | POA: Diagnosis not present

## 2017-07-31 DIAGNOSIS — E119 Type 2 diabetes mellitus without complications: Secondary | ICD-10-CM | POA: Diagnosis not present

## 2017-08-12 ENCOUNTER — Emergency Department (HOSPITAL_COMMUNITY)
Admission: EM | Admit: 2017-08-12 | Discharge: 2017-08-13 | Disposition: A | Discharge status: Home / Self Care | Payer: Medicare HMO | Attending: Emergency Medicine | Admitting: Emergency Medicine

## 2017-08-12 ENCOUNTER — Encounter (HOSPITAL_COMMUNITY): Payer: Self-pay | Admitting: Emergency Medicine

## 2017-08-12 ENCOUNTER — Emergency Department (HOSPITAL_COMMUNITY): Payer: Medicare HMO

## 2017-08-12 DIAGNOSIS — Z86718 Personal history of other venous thrombosis and embolism: Secondary | ICD-10-CM | POA: Insufficient documentation

## 2017-08-12 DIAGNOSIS — N182 Chronic kidney disease, stage 2 (mild): Secondary | ICD-10-CM | POA: Diagnosis not present

## 2017-08-12 DIAGNOSIS — I129 Hypertensive chronic kidney disease with stage 1 through stage 4 chronic kidney disease, or unspecified chronic kidney disease: Secondary | ICD-10-CM | POA: Diagnosis not present

## 2017-08-12 DIAGNOSIS — I252 Old myocardial infarction: Secondary | ICD-10-CM | POA: Diagnosis not present

## 2017-08-12 DIAGNOSIS — J810 Acute pulmonary edema: Secondary | ICD-10-CM | POA: Insufficient documentation

## 2017-08-12 DIAGNOSIS — E785 Hyperlipidemia, unspecified: Secondary | ICD-10-CM | POA: Diagnosis not present

## 2017-08-12 DIAGNOSIS — I2510 Atherosclerotic heart disease of native coronary artery without angina pectoris: Secondary | ICD-10-CM | POA: Diagnosis not present

## 2017-08-12 DIAGNOSIS — I480 Paroxysmal atrial fibrillation: Secondary | ICD-10-CM | POA: Diagnosis not present

## 2017-08-12 DIAGNOSIS — R0602 Shortness of breath: Secondary | ICD-10-CM | POA: Diagnosis not present

## 2017-08-12 DIAGNOSIS — Z79899 Other long term (current) drug therapy: Secondary | ICD-10-CM | POA: Diagnosis not present

## 2017-08-12 DIAGNOSIS — J45909 Unspecified asthma, uncomplicated: Secondary | ICD-10-CM | POA: Diagnosis not present

## 2017-08-12 DIAGNOSIS — E119 Type 2 diabetes mellitus without complications: Secondary | ICD-10-CM | POA: Diagnosis not present

## 2017-08-12 DIAGNOSIS — E039 Hypothyroidism, unspecified: Secondary | ICD-10-CM | POA: Insufficient documentation

## 2017-08-12 DIAGNOSIS — R002 Palpitations: Secondary | ICD-10-CM | POA: Diagnosis not present

## 2017-08-12 DIAGNOSIS — R000 Tachycardia, unspecified: Secondary | ICD-10-CM | POA: Diagnosis not present

## 2017-08-12 DIAGNOSIS — R079 Chest pain, unspecified: Secondary | ICD-10-CM | POA: Diagnosis not present

## 2017-08-12 DIAGNOSIS — R0789 Other chest pain: Secondary | ICD-10-CM | POA: Diagnosis not present

## 2017-08-12 DIAGNOSIS — Z87891 Personal history of nicotine dependence: Secondary | ICD-10-CM | POA: Insufficient documentation

## 2017-08-12 DIAGNOSIS — Z7901 Long term (current) use of anticoagulants: Secondary | ICD-10-CM | POA: Diagnosis not present

## 2017-08-12 LAB — CBC WITH DIFFERENTIAL/PLATELET
BASOS ABS: 0 10*3/uL (ref 0.0–0.1)
Basophils Relative: 0 %
Eosinophils Absolute: 0.3 10*3/uL (ref 0.0–0.7)
Eosinophils Relative: 3 %
HEMATOCRIT: 41.6 % (ref 36.0–46.0)
Hemoglobin: 12.8 g/dL (ref 12.0–15.0)
LYMPHS ABS: 1.5 10*3/uL (ref 0.7–4.0)
LYMPHS PCT: 20 %
MCH: 26.7 pg (ref 26.0–34.0)
MCHC: 30.8 g/dL (ref 30.0–36.0)
MCV: 86.7 fL (ref 78.0–100.0)
MONO ABS: 0.8 10*3/uL (ref 0.1–1.0)
Monocytes Relative: 10 %
NEUTROS ABS: 5.1 10*3/uL (ref 1.7–7.7)
Neutrophils Relative %: 67 %
Platelets: 251 10*3/uL (ref 150–400)
RBC: 4.8 MIL/uL (ref 3.87–5.11)
RDW: 15.6 % — AB (ref 11.5–15.5)
WBC: 7.6 10*3/uL (ref 4.0–10.5)

## 2017-08-12 LAB — COMPREHENSIVE METABOLIC PANEL
ALT: 14 U/L (ref 14–54)
AST: 20 U/L (ref 15–41)
Albumin: 3.6 g/dL (ref 3.5–5.0)
Alkaline Phosphatase: 67 U/L (ref 38–126)
Anion gap: 10 (ref 5–15)
BILIRUBIN TOTAL: 0.4 mg/dL (ref 0.3–1.2)
BUN: 19 mg/dL (ref 6–20)
CO2: 27 mmol/L (ref 22–32)
CREATININE: 0.65 mg/dL (ref 0.44–1.00)
Calcium: 9.4 mg/dL (ref 8.9–10.3)
Chloride: 104 mmol/L (ref 101–111)
Glucose, Bld: 102 mg/dL — ABNORMAL HIGH (ref 65–99)
POTASSIUM: 3.5 mmol/L (ref 3.5–5.1)
Sodium: 141 mmol/L (ref 135–145)
TOTAL PROTEIN: 7.1 g/dL (ref 6.5–8.1)

## 2017-08-12 LAB — TROPONIN I

## 2017-08-12 LAB — BRAIN NATRIURETIC PEPTIDE: B Natriuretic Peptide: 247 pg/mL — ABNORMAL HIGH (ref 0.0–100.0)

## 2017-08-12 MED ORDER — PREDNISONE 10 MG PO TABS
60.0000 mg | ORAL_TABLET | Freq: Once | ORAL | Status: AC
  Administered 2017-08-12: 60 mg via ORAL
  Filled 2017-08-12: qty 1

## 2017-08-12 MED ORDER — PREDNISONE 20 MG PO TABS: ORAL_TABLET | ORAL | 0 refills | Status: DC

## 2017-08-12 MED ORDER — FUROSEMIDE 10 MG/ML IJ SOLN
40.0000 mg | Freq: Once | INTRAMUSCULAR | Status: AC
  Administered 2017-08-12: 40 mg via INTRAVENOUS
  Filled 2017-08-12: qty 4

## 2017-08-12 MED ORDER — LEVALBUTEROL HCL 1.25 MG/0.5ML IN NEBU
1.2500 mg | INHALATION_SOLUTION | Freq: Once | RESPIRATORY_TRACT | Status: AC
  Administered 2017-08-12: 1.25 mg via RESPIRATORY_TRACT
  Filled 2017-08-12: qty 0.5

## 2017-08-12 MED ORDER — FUROSEMIDE 20 MG PO TABS: 20.0000 mg | ORAL_TABLET | Freq: Every day | ORAL | 0 refills | Status: DC

## 2017-08-12 NOTE — ED Triage Notes (Signed)
Pt reports having some shortness of breath with heaviness in chest and nausea since yesterday.

## 2017-08-12 NOTE — ED Provider Notes (Signed)
Emergency Department Provider Note   I have reviewed the triage vital signs and the nursing notes.   HISTORY  Chief Complaint Shortness of Breath   HPI [name] is a 77 y.o. female with multiple medical problems as documented below the presents to the emergency department today secondary to palpitations, shortness of breath, nausea and chest pressure.  Patient states she feels these all started at 7:00 this morning.  He feels similar to previous episodes of atrial fibrillation.  The palpitations seem to have improved since they started however she does still have slight shortness of breath and chest pressure.  She does relate a history of cardiac disease but also has a history of pulmonary embolus and asthma.  Does not feel this is similar to previous asthma attacks.  Does have some mild lower extremity edema that comes and goes. No other associated or modifying symptoms.    Past Medical History:  Diagnosis Date  . Allergy   . Allergy history unknown   . Arthritis   . ASCVD (arteriosclerotic cardiovascular disease)    stent to mid and proximal left anterior descending in 06/2002;drug eluting stent placed in the second diagnol in 08/2003 after  A  non-st elevation myocardial infarction   . Asthma   . Atrial fibrillation ([place])   . Chronic anticoagulation   . Chronic nausea   . Chronic pain of left knee   . Coronary artery disease   . Current chronic use of systemic steroids 05/11/2016  . Depression   . Diabetes mellitus    no insulin  . Diarrhea    acute  . FH: colonic polyps    adenomataous  . GERD (gastroesophageal reflux disease)   . History of kidney stones   . Hyperlipidemia    pulmonary embolism 2000 and 09/2008  . Hypertension   . Hypothyroidism   . Insomnia   . Irritable bowel syndrome   . Myocardial infarction ([place])   . Paroxysmal atrial fibrillation (HCC)    normal LV function; episodes occurred in 205 and 09/2007  . Pedal edema   . Peripheral edema     . PONV (postoperative nausea and vomiting)   . Pulmonary embolism ([place])    2000/09/2008  . Rectal bleeding   . Sleep apnea   . Thyroid disease    hypothyroidism  . Tobacco user    stopped   . Venous stasis     Patient Active Problem List   Diagnosis Date Noted  . Nausea vomiting and diarrhea 05/11/2016  . Obesity 05/11/2016  . 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 Insomnia 04/15/2016  . Hyperkalemia 04/08/2016  . Volume depletion 04/08/2016  . AKI (acute kidney injury) ([place]) 04/08/2016  . Depression 04/07/2016  . Nausea without vomiting 05/09/2014  . Melena 05/08/2014  . UGI bleed 05/08/2014  . Dysphagia, unspecified(787.20) 10/26/2013  . Difficulty in walking(719.7) 10/19/2012  . Weakness of left leg 10/19/2012  . Long term current use of anticoagulant therapy 09/26/2012  . Dysphagia 05/04/2012  . Microcytic anemia 08/10/2011  . Acute respiratory failure ([place]) 08/09/2011  . IBS (irritable bowel syndrome) 08/09/2011  . DOE (dyspnea on exertion) 03/05/2011  . Anemia 03/05/2011  . Peripheral neuropathy 10/02/2010  . 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 DIARRHEA, ACUTE 12/05/2008  . Hypothyroidism 12/02/2008  . Controlled type 2 diabetes mellitus ([place]) 12/02/2008  . Hyperlipidemia LDL goal <70 12/02/2008  . Essential hypertension 12/02/2008  . Coronary atherosclerosis 12/02/2008  .  Chronic atrial fibrillation ([place]) 12/02/2008  . Asthma 12/02/2008  . GERD 12/02/2008  . TOBACCO USE, QUIT 12/02/2008    Past Surgical History:  Procedure Laterality Date  . ABDOMINAL HYSTERECTOMY    . APPENDECTOMY    . BALLOON DILATION N/A 11/11/2013   Procedure: BALLOON DILATION;  Surgeon: Rogene Houston, MD;  [facility];  Service: Endoscopy;  Laterality: N/A;  . BREAST REDUCTION SURGERY    . BREAST SURGERY    . CHOLECYSTECTOMY    . CLEFT PALATE REPAIR     4 surgeries  . COLONOSCOPY  2011   negative  .  COLONOSCOPY N/A 05/10/2014   Procedure: COLONOSCOPY;  Surgeon: Rogene Houston, MD;  [facility];  Service: Endoscopy;  Laterality: N/A;  . ESOPHAGEAL DILATION N/A 03/08/2016   Procedure: ESOPHAGEAL DILATION;  Surgeon: Rogene Houston, MD;  [facility];  Service: Endoscopy;  Laterality: N/A;  . ESOPHAGOGASTRODUODENOSCOPY N/A 11/11/2013   Procedure: ESOPHAGOGASTRODUODENOSCOPY (EGD);  Surgeon: Rogene Houston, MD;  [facility];  Service: Endoscopy;  Laterality: N/A;  200-moved to 100 Ann notified pt  . ESOPHAGOGASTRODUODENOSCOPY N/A 05/09/2014   Procedure: ESOPHAGOGASTRODUODENOSCOPY (EGD);  Surgeon: Rogene Houston, MD;  [facility];  Service: Endoscopy;  Laterality: N/A;  . ESOPHAGOGASTRODUODENOSCOPY N/A 03/08/2016   Procedure: ESOPHAGOGASTRODUODENOSCOPY (EGD);  Surgeon: Rogene Houston, MD;  [facility];  Service: Endoscopy;  Laterality: N/A;  2:20  . HEEL SPUR SURGERY    . KNEE SURGERY    . MALONEY DILATION N/A 11/11/2013   Procedure: Venia Minks DILATION;  Surgeon: Rogene Houston, MD;  [facility];  Service: Endoscopy;  Laterality: N/A;  . OOPHORECTOMY    . SAVORY DILATION N/A 11/11/2013   Procedure: SAVORY DILATION;  Surgeon: Rogene Houston, MD;  [facility];  Service: Endoscopy;  Laterality: N/A;  . TONSILLECTOMY      Current Outpatient Rx  . Order #: [id number] Class: Historical Med  . Order #: [id number] Class: Normal  . Order #: [id number] Class: Normal  . Order #: [id number] Class: Historical Med  . Order #: [id number] Class: Normal  . Order #: [id number] Class: Normal  . Order #: [id number] Class: Normal  . Order #: [id number] Class: Normal  . Order #: [id number] Class: Normal  . Order #: [id number] Class: Normal  . Order #: [id number] Class: Normal  . Order #: [id number] Class: Normal  . Order #: [id number] Class: Print  . Order #: [id number] Class: Normal  . Order #: [id number] Class: Normal  . Order #: [id number] Class:  Normal  . Order #: [id number] Class: Normal  . Order #: [id number] Class: Print  . Order #: [id number] Class: Print    Allergies Cefzil [cefprozil] and Demerol  Family History  Problem Relation Age of Onset  . Heart attack Mother   . Diabetes Mother   . Hyperlipidemia Mother   . Heart attack Father   . Lung cancer Sister   . Lymphoma Brother   . Colon cancer Neg Hx     Social History Social History   Tobacco Use  . Smoking status: Former Smoker    Packs/day: 2.00    Years: 30.00    Pack years: 60.00    Types: Cigarettes    Start date: 05/05/1987  . Smokeless tobacco: Never Used  Substance Use Topics  . Alcohol use: No    Alcohol/week: 0.0 oz  . Drug use: No    Review of Systems  All other systems negative except as documented in the HPI. All pertinent positives and  negatives as reviewed in the HPI. ____________________________________________   PHYSICAL EXAM:  VITAL SIGNS: ED Triage Vitals  Enc Vitals Group     BP 08/12/17 0826 (!) 141/85     Pulse Rate 08/12/17 0826 89     Resp 08/12/17 0826 18     Temp 08/12/17 0826 98.1 F (36.7 C)     Temp Source 08/12/17 0826 Oral     SpO2 08/12/17 0826 97 %     Weight 08/12/17 0824 219 lb (99.3 kg)     Height 08/12/17 0824 5\' 1"  (1.549 m)    Constitutional: Alert and oriented. Well appearing and in no acute distress. Eyes: Conjunctivae are normal. PERRL. EOMI. Head: Atraumatic. Nose: No congestion/rhinnorhea. Mouth/Throat: Mucous membranes are moist.  Oropharynx non-erythematous. Neck: No stridor.  No meningeal signs.   Cardiovascular: Normal rate, irregular rhythm. Good peripheral circulation. Grossly normal heart sounds.   Respiratory: Normal respiratory effort.  No retractions. Lungs diminished with mild end expiratory wheezing but audible upper airway wheezing.. Gastrointestinal: Soft and nontender. No distention.  Musculoskeletal: No lower extremity tenderness with trace edema. No gross deformities of  extremities. Neurologic:  Normal speech and language. No gross focal neurologic deficits are appreciated.  Skin:  Skin is warm, dry and intact. No rash noted.   ____________________________________________   LABS (all labs ordered are listed, but only abnormal results are displayed)  Labs Reviewed  CBC WITH DIFFERENTIAL/PLATELET - Abnormal; Notable for the following components:      Result Value   RDW 15.6 (*)    All other components within normal limits  COMPREHENSIVE METABOLIC PANEL - Abnormal; Notable for the following components:   Glucose, Bld 102 (*)    All other components within normal limits  BRAIN NATRIURETIC PEPTIDE - Abnormal; Notable for the following components:   B Natriuretic Peptide 247.0 (*)    All other components within normal limits  TROPONIN I   ____________________________________________  EKG   EKG Interpretation  Date/Time:  Tuesday August 12 2017 08:28:45 EST Ventricular Rate:  102 PR Interval:    QRS Duration: 93 QT Interval:  412 QTC Calculation: 537 R Axis:   2 Text Interpretation:  Atrial fibrillation Low voltage, precordial leads Prolonged QT interval Afib new since november 2018 Confirmed by Merrily Pew [phone]) on 08/12/2017 9:04:06 AM       ____________________________________________  RADIOLOGY  Dg Chest Portable 1 View  Result Date: 08/12/2017 CLINICAL DATA:  Shortness of breath, chest pain, atrial fib EXAM: PORTABLE CHEST 1 VIEW COMPARISON:  05/05/2017 FINDINGS: Cardiomegaly with vascular congestion. Minimal right base atelectasis. No confluent opacity on the left. No effusions or acute bony abnormality. IMPRESSION: Cardiomegaly with vascular congestion. Right base atelectasis. Electronically Signed   By: Rolm Baptise M.D.   On: 08/12/2017 08:50    ____________________________________________   PROCEDURES  Procedure(s) performed:   Procedures   ____________________________________________   INITIAL IMPRESSION /  ASSESSMENT AND PLAN / ED COURSE Unclear if symptoms are from asthma exacerbation versus atrial fibrillation versus less likely to be heart attack or congestive heart failure.  We will treat for asthma and evaluate for other etiologies and disposition appropriately.  Patient found to have likely mild pulmonary edema had much improvement after some diuresis with Lasix.  We will sent home on a short course of Lasix with PCP follow-up for further workup and management.  No hypoxia, respiratory distress or other indication for admission at this time.  Pertinent labs & imaging results that were available during my care of  the patient were reviewed by me and considered in my medical decision making (see chart for details).  ____________________________________________  FINAL CLINICAL IMPRESSION(S) / ED DIAGNOSES  Final diagnoses:  Shortness of breath  Acute pulmonary edema (HCC)     MEDICATIONS GIVEN DURING THIS VISIT:  Medications  levalbuterol (XOPENEX) nebulizer solution 1.25 mg (1.25 mg Nebulization Given 08/12/17 1004)  predniSONE (DELTASONE) tablet 60 mg (60 mg Oral Given 08/12/17 0931)  furosemide (LASIX) injection 40 mg (40 mg Intravenous Given 08/12/17 1143)     NEW OUTPATIENT MEDICATIONS STARTED DURING THIS VISIT:  New Prescriptions   FUROSEMIDE (LASIX) 20 MG TABLET    Take 1 tablet (20 mg total) by mouth daily for 7 days.   PREDNISONE (DELTASONE) 20 MG TABLET    2 tabs po daily x 4 days    Note:  This note was prepared with assistance of Dragon voice recognition software. Occasional wrong-word or sound-a-like substitutions may have occurred due to the inherent limitations of voice recognition software.   Merrily Pew, MD 08/12/17 1515

## 2017-08-15 ENCOUNTER — Other Ambulatory Visit: Payer: Self-pay | Admitting: Family Medicine

## 2017-08-15 ENCOUNTER — Encounter: Payer: Self-pay | Admitting: Family Medicine

## 2017-08-15 ENCOUNTER — Ambulatory Visit (INDEPENDENT_AMBULATORY_CARE_PROVIDER_SITE_OTHER): Payer: Medicare HMO | Admitting: Family Medicine

## 2017-08-15 VITALS — BP 120/84 | Ht 61.0 in | Wt 213.0 lb

## 2017-08-15 DIAGNOSIS — R69 Illness, unspecified: Secondary | ICD-10-CM | POA: Diagnosis not present

## 2017-08-15 DIAGNOSIS — I5033 Acute on chronic diastolic (congestive) heart failure: Secondary | ICD-10-CM

## 2017-08-15 MED ORDER — ESCITALOPRAM OXALATE 20 MG PO TABS: 20.0000 mg | ORAL_TABLET | Freq: Every day | ORAL | 5 refills | Status: DC

## 2017-08-15 MED ORDER — ALBUTEROL SULFATE HFA 108 (90 BASE) MCG/ACT IN AERS: INHALATION_SPRAY | RESPIRATORY_TRACT | 3 refills | Status: DC

## 2017-08-15 MED ORDER — WARFARIN SODIUM 5 MG PO TABS: ORAL_TABLET | ORAL | 5 refills | Status: DC

## 2017-08-15 MED ORDER — LEVOTHYROXINE SODIUM 50 MCG PO TABS: 50.0000 ug | ORAL_TABLET | Freq: Every day | ORAL | 5 refills | Status: DC

## 2017-08-15 MED ORDER — FUROSEMIDE 20 MG PO TABS: 20.0000 mg | ORAL_TABLET | Freq: Every day | ORAL | 1 refills | Status: DC

## 2017-08-15 MED ORDER — POTASSIUM CHLORIDE ER 20 MEQ PO TBCR: EXTENDED_RELEASE_TABLET | ORAL | 1 refills | Status: DC

## 2017-08-15 NOTE — Progress Notes (Signed)
   Subjective:    Patient ID: [name], female    DOB: May 26, 1941, 77 y.o.   MRN: [id number]  HPI Patient is here today for a hospital follow up. She went to the hospital on 08/12/2017 for shortness of breath and palpitations. She states she had fluid overload. She states the hospital pulled two quarts of fluid off of her lungs. She states she is now on prednisone and feels better,can breath good,feels like a different person.Na restrictions, she is changing her eating habits.  Patient recently seen in the emergency room.  Complete hospital record and all results in all intervention reviewed in presence of patient and with patient today  Patient reports now her breathing overall has improved considerably.  Handling the diuretic well no obvious side effects.  Blood work in the ER revealed borderline low potassium Review of Systems No headache, no major weight loss or weight gain, no chest pain no back pain abdominal pain no change in bowel habits complete ROS otherwise negative     Objective:   Physical Exam  Alert and oriented, vitals reviewed and stable, NAD ENT-TM's and ext canals WNL bilat via otoscopic exam Soft palate, tonsils and post pharynx WNL via oropharyngeal exam Neck-symmetric, no masses; thyroid nonpalpable and nontender Pulmonary-no tachypnea or accessory muscle use; Clear without wheezes via auscultation Card--no abnrml murmurs, rhythm irregular but rate controlled NL Carotid pulses symmetric, without bruits       Assessment & Plan:  Impression 1 element of congestive heart failure.  Echocardiogram over 2 years ago revealed diastolic dysfunction patient also has A. fib.  Control very good at this point.  Due to see cardiologist next week.  They may like to do echocardiogram.  Will maintain Lasix long-term.  Will add potassium rationale discussed with patient warning signs discussed.  Watch weight decrease salt intake  Greater than 50% of this 25 minute face to  face visit was spent in counseling and discussion and coordination of care regarding the above diagnosis/diagnosies

## 2017-08-26 ENCOUNTER — Ambulatory Visit: Payer: Medicare HMO

## 2017-08-26 ENCOUNTER — Ambulatory Visit: Payer: Medicare HMO | Admitting: Cardiovascular Disease

## 2017-08-28 ENCOUNTER — Ambulatory Visit: Payer: Medicare HMO | Admitting: Family Medicine

## 2017-09-03 ENCOUNTER — Other Ambulatory Visit: Payer: Self-pay | Admitting: Cardiovascular Disease

## 2017-09-03 ENCOUNTER — Ambulatory Visit: Payer: Medicare HMO

## 2017-09-05 ENCOUNTER — Telehealth: Payer: Self-pay | Admitting: Family Medicine

## 2017-09-05 ENCOUNTER — Ambulatory Visit (INDEPENDENT_AMBULATORY_CARE_PROVIDER_SITE_OTHER): Payer: Medicare HMO

## 2017-09-05 DIAGNOSIS — Z7901 Long term (current) use of anticoagulants: Secondary | ICD-10-CM

## 2017-09-05 LAB — POCT INR: INR: 1.7

## 2017-09-05 NOTE — Telephone Encounter (Signed)
Pt in today for INR check; stated she was started on Lasix but she is not urinating as she usually does. She wanted to know if she could take 2 of the fluid pills. She stated that if she did not get better sh would go to ER.

## 2017-09-05 NOTE — Patient Instructions (Signed)
Take one tablet each day except Thursday; on Thursday take 1/2 tablet. Recheck in 2 weeks

## 2017-09-08 NOTE — Telephone Encounter (Signed)
Left message to return call 

## 2017-09-08 NOTE — Telephone Encounter (Signed)
Let her know we really do not think of this med as her needing to feel she is peeing enough, nmore related to sewelling and weight gain and excess fluid, so rec cont same dose of lasix

## 2017-09-08 NOTE — Telephone Encounter (Signed)
First, call pt again, how is her weight doing, sob?, sticking with same dose or has self adjusted?  Does pt weigh herself daily??  Watching salt intake?

## 2017-09-08 NOTE — Telephone Encounter (Signed)
Discussed with pt. Pt verbalized understanding.  °

## 2017-09-08 NOTE — Telephone Encounter (Signed)
She is watching salt intake, she states no sob really but her breathing is worse at night. Does not weigh herself. She is only taking one dose of lasix at 9am

## 2017-09-19 ENCOUNTER — Ambulatory Visit: Payer: Medicare HMO

## 2017-09-23 ENCOUNTER — Other Ambulatory Visit: Payer: Self-pay | Admitting: Family Medicine

## 2017-09-23 ENCOUNTER — Telehealth: Payer: Self-pay | Admitting: Family Medicine

## 2017-09-23 ENCOUNTER — Ambulatory Visit (INDEPENDENT_AMBULATORY_CARE_PROVIDER_SITE_OTHER): Payer: Medicare HMO

## 2017-09-23 DIAGNOSIS — H9190 Unspecified hearing loss, unspecified ear: Secondary | ICD-10-CM

## 2017-09-23 DIAGNOSIS — Z7901 Long term (current) use of anticoagulants: Secondary | ICD-10-CM

## 2017-09-23 DIAGNOSIS — R32 Unspecified urinary incontinence: Secondary | ICD-10-CM

## 2017-09-23 LAB — POCT INR: INR: 1.8

## 2017-09-23 NOTE — Patient Instructions (Signed)
TAKE ONE TABLET(5MG ) EVERY DAY. RECHECK INR 10/07/17

## 2017-09-23 NOTE — Telephone Encounter (Signed)
Referrals put in. Pt notified.

## 2017-09-23 NOTE — Telephone Encounter (Signed)
1.  It would be fine to go ahead and refer her to ENT for hearing issue #2 it is okay to refer to urology but there may be some limits to what urology can do.  Keep all regular follow-up visits with Dr. Richardson Landry

## 2017-09-23 NOTE — Telephone Encounter (Signed)
Pt would like referral to Dr. Benjamine Mola for hearing issues. She states her hearing is muffled. Also would like referral to a urologist due to incontinence. Pt states she "urinates 24/7 and a lot". She is taking Lasix 20mg . She understands Dr. Richardson Landry is out this week.

## 2017-09-29 ENCOUNTER — Encounter: Payer: Self-pay | Admitting: Family Medicine

## 2017-09-30 ENCOUNTER — Other Ambulatory Visit: Payer: Self-pay | Admitting: Cardiovascular Disease

## 2017-10-02 ENCOUNTER — Telehealth: Payer: Self-pay | Admitting: Family Medicine

## 2017-10-02 NOTE — Telephone Encounter (Signed)
Pt contacted office and stated the she is on blood thinner and had cut her face. She stated she was applying pressure and it was not bleeding bad, it was a slow bleed but the blood would not clot. Nurse informed her to continue to hold pressure on the area and if it continued to bleed to go to ER. Pt asked if provider would decrease her Coumadin to 1/2 tab instead of 1 tablet each day. Patient was in for INR on 09/23/2017; result was 1.8 and Dr. Nicki Reaper increased to one tablet each day. Next INR is 10/07/2016

## 2017-10-02 NOTE — Telephone Encounter (Signed)
Keep coumadin dose same and apply pressure as advised

## 2017-10-02 NOTE — Telephone Encounter (Signed)
Contacted pt and pt verbalized understanding

## 2017-10-07 ENCOUNTER — Ambulatory Visit (INDEPENDENT_AMBULATORY_CARE_PROVIDER_SITE_OTHER): Payer: Medicare HMO | Admitting: *Deleted

## 2017-10-07 DIAGNOSIS — Z7901 Long term (current) use of anticoagulants: Secondary | ICD-10-CM | POA: Diagnosis not present

## 2017-10-07 LAB — POCT INR: INR: 2.4

## 2017-10-07 NOTE — Patient Instructions (Signed)
Coumadin 5mg  tablets. Take one every day. Recheck INR in 4 weeks

## 2017-10-27 ENCOUNTER — Ambulatory Visit: Payer: Medicare HMO | Admitting: Cardiovascular Disease

## 2017-10-28 ENCOUNTER — Ambulatory Visit: Payer: Medicare HMO | Admitting: Family Medicine

## 2017-10-29 ENCOUNTER — Other Ambulatory Visit: Payer: Self-pay | Admitting: *Deleted

## 2017-10-29 MED ORDER — FLUTICASONE PROPIONATE HFA 110 MCG/ACT IN AERO: 2.0000 | INHALATION_SPRAY | Freq: Two times a day (BID) | RESPIRATORY_TRACT | 3 refills | Status: DC

## 2017-11-04 ENCOUNTER — Ambulatory Visit: Payer: Medicare HMO

## 2017-11-05 ENCOUNTER — Encounter: Payer: Self-pay | Admitting: Family Medicine

## 2017-11-05 ENCOUNTER — Ambulatory Visit (INDEPENDENT_AMBULATORY_CARE_PROVIDER_SITE_OTHER): Payer: Medicare HMO | Admitting: Family Medicine

## 2017-11-05 VITALS — Ht 61.0 in | Wt 219.6 lb

## 2017-11-05 DIAGNOSIS — Z79899 Other long term (current) drug therapy: Secondary | ICD-10-CM | POA: Diagnosis not present

## 2017-11-05 DIAGNOSIS — E039 Hypothyroidism, unspecified: Secondary | ICD-10-CM | POA: Diagnosis not present

## 2017-11-05 DIAGNOSIS — Z7901 Long term (current) use of anticoagulants: Secondary | ICD-10-CM | POA: Diagnosis not present

## 2017-11-05 DIAGNOSIS — E119 Type 2 diabetes mellitus without complications: Secondary | ICD-10-CM | POA: Diagnosis not present

## 2017-11-05 DIAGNOSIS — Z79891 Long term (current) use of opiate analgesic: Secondary | ICD-10-CM | POA: Diagnosis not present

## 2017-11-05 LAB — POCT INR: INR: 2.5

## 2017-11-05 MED ORDER — OXYCODONE HCL 10 MG PO TABS: 10.0000 mg | ORAL_TABLET | Freq: Two times a day (BID) | ORAL | 0 refills | Status: DC | PRN

## 2017-11-05 NOTE — Progress Notes (Signed)
   Subjective:    Patient ID: [name], female    DOB: 06-04-1941, 77 y.o.   MRN: [id number]  HPI  This patient was seen today for chronic pain  The medication list was reviewed and updated.   -Compliance with medication: yes- oxycodone 10 mg  - Number patient states they take daily: 2-3 a day  -when was the last dose patient took? Took last one this am  The patient was advised the importance of maintaining medication and not using illegal substances with these.  Here for refills and follow up  The patient was educated that we can provide 3 monthly scripts for their medication, it is their responsibility to follow the instructions.  Side effects or complications from medications: none  Patient is aware that pain medications are meant to minimize the severity of the pain to allow their pain levels to improve to allow for better function. They are aware of that pain medications cannot totally remove their pain.  Due for UDT ( at least once per year) : today- 10/2017  Patient would like blood work to test her liver and kidneys  Results for orders placed or performed in visit on 11/05/17  POCT INR  Result Value Ref Range   INR 2.5    Patient does coumadin 5mg  one tablet daily    Patient notes her wheezing has been acting up more lately.  Compliant with medications.  Flovent was accidentally removed from her medication list.  Took this quite a bit of time to track this down and restored to the medication last  Blood pressure medicine and blood pressure levels reviewed today with patient. Compliant with blood pressure medicine. States does not miss a dose. No obvious side effects. Blood pressure generally good when checked elsewhere. Watching salt intake.   Patient claims compliance with diabetes medication. No obvious side effects. Reports no substantial low sugar spells. Most numbers are generally in good range when checked fasting. Generally does not miss a dose of  medication. Watching diabetic diet closely    Review of Systems No headache, no major weight loss or weight gain, no chest pain no back pain abdominal pain no change in bowel habits complete ROS otherwise negative     Objective:   Physical Exam  Alert and oriented, vitals reviewed and stable, NAD ENT-TM's and ext canals WNL bilat via otoscopic exam Soft palate, tonsils and post pharynx WNL via oropharyngeal exam Neck-symmetric, no masses; thyroid nonpalpable and nontender Pulmonary-no tachypnea or accessory muscle use; Wheezes wheezes via auscultation Card--no abnrml murmurs, rhythm reg and rate WNL Carotid pulses symmetric, without bruits Mild expiratory      Assessment & Plan:  Impression type 2 diabetes.  Controlled good discussed to maintain same therapy  2.  Hypertension.  Good control discussed to maintain same meds  3.  Hyperlipidemia.  Prior blood work reviewed compliance discussed to maintain  4.  Chronic asthma.  Very controlled though not perfect patient wishes no further medicines at this time  Impression: Chronic pain. Patient compliant with medication. No substantial side effects. [place] controlled substance registry reviewed to ensure compliance and proper use of medication. Patient aware goal of medicine is not complete resolution of pain but to control his symptoms to improve his functional capacity. Aware of potential adverse side effects   Up in 3 months

## 2017-11-06 LAB — HEPATIC FUNCTION PANEL
ALK PHOS: 72 IU/L (ref 39–117)
ALT: 12 IU/L (ref 0–32)
AST: 22 IU/L (ref 0–40)
Albumin: 4 g/dL (ref 3.5–4.8)
Bilirubin Total: <0.2 mg/dL (ref 0.0–1.2)
Bilirubin, Direct: 0.07 mg/dL (ref 0.00–0.40)
Total Protein: 7.2 g/dL (ref 6.0–8.5)

## 2017-11-06 LAB — BASIC METABOLIC PANEL
BUN/Creatinine Ratio: 25 (ref 12–28)
BUN: 30 mg/dL — ABNORMAL HIGH (ref 8–27)
CALCIUM: 9.7 mg/dL (ref 8.7–10.3)
CHLORIDE: 102 mmol/L (ref 96–106)
CO2: 25 mmol/L (ref 20–29)
Creatinine, Ser: 1.18 mg/dL — ABNORMAL HIGH (ref 0.57–1.00)
GFR calc non Af Amer: 44 mL/min/{1.73_m2} — ABNORMAL LOW (ref >59)
GFR, EST AFRICAN AMERICAN: 51 mL/min/{1.73_m2} — AB (ref >59)
Glucose: 80 mg/dL (ref 65–99)
POTASSIUM: 4.4 mmol/L (ref 3.5–5.2)
SODIUM: 144 mmol/L (ref 134–144)

## 2017-11-06 LAB — HEMOGLOBIN A1C
Est. average glucose Bld gHb Est-mCnc: 123 mg/dL
Hgb A1c MFr Bld: 5.9 % — ABNORMAL HIGH (ref 4.8–5.6)

## 2017-11-06 LAB — TSH: TSH: 6.4 u[IU]/mL — ABNORMAL HIGH (ref 0.450–4.500)

## 2017-11-11 LAB — TOXASSURE SELECT 13 (MW), URINE

## 2017-11-21 ENCOUNTER — Ambulatory Visit: Payer: Medicare HMO | Admitting: Urology

## 2017-11-24 ENCOUNTER — Telehealth: Payer: Self-pay | Admitting: Family Medicine

## 2017-11-24 ENCOUNTER — Other Ambulatory Visit: Payer: Self-pay

## 2017-11-24 MED ORDER — LEVOTHYROXINE SODIUM 75 MCG PO TABS: 75.0000 ug | ORAL_TABLET | Freq: Every day | ORAL | 5 refills | Status: DC

## 2017-11-24 NOTE — Telephone Encounter (Signed)
Patient got a call this morning about her blood work results.  She wants to know if there is anything she can do about her kidneys to stop them from getting worse?

## 2017-11-24 NOTE — Addendum Note (Signed)
Addended by: Karle Barr on: 11/24/2017 09:50 AM   Modules accepted: Orders

## 2017-11-24 NOTE — Telephone Encounter (Signed)
Please advise 

## 2017-11-25 ENCOUNTER — Other Ambulatory Visit: Payer: Self-pay | Admitting: Family Medicine

## 2017-11-25 DIAGNOSIS — Z79899 Other long term (current) drug therapy: Secondary | ICD-10-CM

## 2017-11-25 NOTE — Telephone Encounter (Signed)
Spoke with patient; lab orders entered and pt verbalized understanding.

## 2017-11-25 NOTE — Telephone Encounter (Signed)
Common for some renal disease to appear in mpts with numerous health issues. The lasix pt now taking for element of heart failure can aggravate this, but fluid pill important ot maintian good breathing, rec re ck this met 7 in one month.

## 2017-12-03 ENCOUNTER — Telehealth: Payer: Self-pay | Admitting: Family Medicine

## 2017-12-03 NOTE — Telephone Encounter (Signed)
Patient is calling for clarification on when she is supposed to have her lab work completed.

## 2017-12-03 NOTE — Telephone Encounter (Signed)
Patient states she was told to have her blood work done one month from the date of her office visit. She was seen 11/05/2017,So I advised that she have her labs drawn now. As it is now a month from the date of the last office visit.

## 2017-12-05 ENCOUNTER — Other Ambulatory Visit: Payer: Self-pay | Admitting: Nurse Practitioner

## 2017-12-05 ENCOUNTER — Ambulatory Visit: Payer: Medicare HMO

## 2017-12-05 ENCOUNTER — Ambulatory Visit (INDEPENDENT_AMBULATORY_CARE_PROVIDER_SITE_OTHER): Payer: Medicare HMO | Admitting: *Deleted

## 2017-12-05 DIAGNOSIS — Z7901 Long term (current) use of anticoagulants: Secondary | ICD-10-CM | POA: Diagnosis not present

## 2017-12-05 LAB — POCT INR: INR: 1.8 — AB (ref 2.0–3.0)

## 2017-12-05 MED ORDER — DIPHENOXYLATE-ATROPINE 2.5-0.025 MG PO TABS: ORAL_TABLET | ORAL | 2 refills | Status: DC

## 2017-12-05 NOTE — Patient Instructions (Signed)
Coumadin 5mg  take one tablet daily and recheck INR in 2 weeks.

## 2017-12-19 ENCOUNTER — Encounter

## 2017-12-19 ENCOUNTER — Ambulatory Visit (INDEPENDENT_AMBULATORY_CARE_PROVIDER_SITE_OTHER): Payer: Medicare HMO | Admitting: Cardiovascular Disease

## 2017-12-19 ENCOUNTER — Ambulatory Visit: Payer: Medicare HMO

## 2017-12-19 ENCOUNTER — Encounter: Payer: Self-pay | Admitting: Cardiovascular Disease

## 2017-12-19 VITALS — BP 126/76 | HR 60 | Ht 61.0 in | Wt 216.0 lb

## 2017-12-19 DIAGNOSIS — I25118 Atherosclerotic heart disease of native coronary artery with other forms of angina pectoris: Secondary | ICD-10-CM | POA: Diagnosis not present

## 2017-12-19 DIAGNOSIS — I252 Old myocardial infarction: Secondary | ICD-10-CM

## 2017-12-19 DIAGNOSIS — I5032 Chronic diastolic (congestive) heart failure: Secondary | ICD-10-CM

## 2017-12-19 DIAGNOSIS — Z955 Presence of coronary angioplasty implant and graft: Secondary | ICD-10-CM

## 2017-12-19 DIAGNOSIS — I480 Paroxysmal atrial fibrillation: Secondary | ICD-10-CM

## 2017-12-19 DIAGNOSIS — I10 Essential (primary) hypertension: Secondary | ICD-10-CM

## 2017-12-19 DIAGNOSIS — E785 Hyperlipidemia, unspecified: Secondary | ICD-10-CM

## 2017-12-19 MED ORDER — FUROSEMIDE 40 MG PO TABS: 40.0000 mg | ORAL_TABLET | Freq: Every day | ORAL | 3 refills | Status: DC

## 2017-12-19 NOTE — Progress Notes (Signed)
SUBJECTIVE: The patient presents for past due follow-up.  I last evaluated her on 06/04/2016.  She has a history of coronary disease and non-STEMI with prior percutaneous coronary interventions.  She also has paroxysmal atrial fibrillation, hypertension, and hyperlipidemia.  She was evaluated in the ED on 08/12/2017 for shortness of breath.  I reviewed all relevant documentation, labs, and studies.  BNP was elevated at 247.  Chest x-ray was consistent with CHF.  She was given IV Lasix.  Echocardiogram dated 10/17/2014 showed vigorous left ventricular systolic function, LVEF 65 to 21%, grade 2 diastolic dysfunction with elevated filling pressures.  I personally reviewed the ECG performed on 08/12/2017 which showed atrial fibrillation, 102 bpm.  I reviewed lipids dated 04/14/2017 which showed total cholesterol 151, triglycerides 102, HDL 59, LDL 72.  She currently denies chest pain.  She feels that her Lasix does not work consistently.  She takes 20 mill grams daily.  She said her ankles and feet swell if she has been up on her feet for prolonged periods of time.  She denies orthopnea and paroxysmal nocturnal dyspnea.  She denies palpitations.     Review of Systems: As per "subjective", otherwise negative.  Allergies  Allergen Reactions  . Cefzil [Cefprozil] Nausea And Vomiting  . Demerol Nausea And Vomiting    Current Outpatient Medications  Medication Sig Dispense Refill  . acetaminophen (TYLENOL) 500 MG tablet Take 500 mg by mouth daily as needed for headache.    . albuterol (PROVENTIL) (2.5 MG/3ML) 0.083% nebulizer solution Take 3 mLs (2.5 mg total) by nebulization every 6 (six) hours as needed for wheezing or shortness of breath. 75 mL 5  . albuterol (VENTOLIN HFA) 108 (90 Base) MCG/ACT inhaler INHALE 2 PUFFS INTO THE LUNGS 4 TIMES DAILY AS NEEDED FOR WHEEZING. 18 g 3  . atorvastatin (LIPITOR) 40 MG tablet TAKE ONE TABLET BY MOUTH DAILY. 90 tablet 3  . Cholecalciferol (VITAMIN  D3) 1000 units CAPS Take 1 capsule every evening by mouth.     . diltiazem (CARDIZEM CD) 180 MG 24 hr capsule TAKE ONE CAPSULE BY MOUTH DAILY. 30 capsule 11  . diphenoxylate-atropine (LOMOTIL) 2.5-0.025 MG tablet TAKE ONE TABLET BY MOUTH 2 TIMES A DAY AS NEEDED. 60 tablet 2  . escitalopram (LEXAPRO) 20 MG tablet Take 1 tablet (20 mg total) by mouth daily. 30 tablet 5  . fluticasone (FLOVENT HFA) 110 MCG/ACT inhaler Inhale 2 puffs into the lungs 2 (two) times daily. 12 g 3  . fluticasone (FLOVENT HFA) 220 MCG/ACT inhaler Inhale 2 puffs into the lungs 2 (two) times daily. 1 Inhaler 12  . furosemide (LASIX) 20 MG tablet Take 1 tablet (20 mg total) by mouth daily. 90 tablet 1  . hydrocortisone 2.5 % cream Apply topically 2 (two) times daily. 30 g 0  . levothyroxine (SYNTHROID, LEVOTHROID) 75 MCG tablet Take 1 tablet (75 mcg total) by mouth daily. 30 tablet 5  . losartan (COZAAR) 25 MG tablet Take 1 tablet (25 mg total) by mouth daily. 90 tablet 3  . nitroGLYCERIN (NITROLINGUAL) 0.4 MG/SPRAY spray Place 1 spray under the tongue every 5 (five) minutes x 3 doses as needed for chest pain. 12 g 3  . ONE TOUCH ULTRA TEST test strip USE TO TEST BLOOD SUGAR ONCE DAILY. 50 each 0  . Oxycodone HCl 10 MG TABS Take 1 tablet (10 mg total) by mouth 2 (two) times daily as needed (FOR PAIN). 60 tablet 0  . pantoprazole (PROTONIX) 40 MG  tablet Take 1 tablet (40 mg total) by mouth 2 (two) times daily before a meal. (Patient taking differently: Take 40 mg 2 (two) times daily as needed by mouth (FOR ACID REFLUX). ) 60 tablet 3  . Potassium Chloride ER 20 MEQ TBCR One po daily 90 tablet 1  . predniSONE (DELTASONE) 20 MG tablet 2 tabs po daily x 4 days 8 tablet 0  . promethazine (PHENERGAN) 12.5 MG tablet TAKE (1) TABLET BY MOUTH EVERY SIX HOURS AS NEEDED FOR NAUSEA/VOMITING. 12 tablet 0  . warfarin (COUMADIN) 5 MG tablet TAKE ONE TABLET BY MOUTH ONCE DAILY. TAKE AS DIRECTED BY DOCTOR. 30 tablet 5   No current  facility-administered medications for this visit.     Past Medical History:  Diagnosis Date  . Allergy   . Allergy history unknown   . Arthritis   . ASCVD (arteriosclerotic cardiovascular disease)    stent to mid and proximal left anterior descending in 06/2002;drug eluting stent placed in the second diagnol in 08/2003 after  A  non-st elevation myocardial infarction   . Asthma   . Atrial fibrillation ([place])   . Chronic anticoagulation   . Chronic nausea   . Chronic pain of left knee   . Coronary artery disease   . Current chronic use of systemic steroids 05/11/2016  . Depression   . Diabetes mellitus    no insulin  . Diarrhea    acute  . FH: colonic polyps    adenomataous  . GERD (gastroesophageal reflux disease)   . History of kidney stones   . Hyperlipidemia    pulmonary embolism 2000 and 09/2008  . Hypertension   . Hypothyroidism   . Insomnia   . Irritable bowel syndrome   . Myocardial infarction ([place])   . Paroxysmal atrial fibrillation ([facility])    normal LV function; episodes occurred in 205 and 09/2007  . Pedal edema   . Peripheral edema   . PONV (postoperative nausea and vomiting)   . Pulmonary embolism ([place])    2000/09/2008  . Rectal bleeding   . Sleep apnea   . Thyroid disease    hypothyroidism  . Tobacco user    stopped   . Venous stasis     Past Surgical History:  Procedure Laterality Date  . ABDOMINAL HYSTERECTOMY    . APPENDECTOMY    . BALLOON DILATION N/A 11/11/2013   Procedure: BALLOON DILATION;  Surgeon: Rogene Houston, MD;  [facility];  Service: Endoscopy;  Laterality: N/A;  . BREAST REDUCTION SURGERY    . BREAST SURGERY    . CHOLECYSTECTOMY    . CLEFT PALATE REPAIR     4 surgeries  . COLONOSCOPY  2011   negative  . COLONOSCOPY N/A 05/10/2014   Procedure: COLONOSCOPY;  Surgeon: Rogene Houston, MD;  [facility];  Service: Endoscopy;  Laterality: N/A;  . ESOPHAGEAL DILATION N/A 03/08/2016   Procedure: ESOPHAGEAL DILATION;   Surgeon: Rogene Houston, MD;  [facility];  Service: Endoscopy;  Laterality: N/A;  . ESOPHAGOGASTRODUODENOSCOPY N/A 11/11/2013   Procedure: ESOPHAGOGASTRODUODENOSCOPY (EGD);  Surgeon: Rogene Houston, MD;  [facility];  Service: Endoscopy;  Laterality: N/A;  200-moved to 100 Ann notified pt  . ESOPHAGOGASTRODUODENOSCOPY N/A 05/09/2014   Procedure: ESOPHAGOGASTRODUODENOSCOPY (EGD);  Surgeon: Rogene Houston, MD;  [facility];  Service: Endoscopy;  Laterality: N/A;  . ESOPHAGOGASTRODUODENOSCOPY N/A 03/08/2016   Procedure: ESOPHAGOGASTRODUODENOSCOPY (EGD);  Surgeon: Rogene Houston, MD;  [facility];  Service: Endoscopy;  Laterality: N/A;  2:20  . HEEL SPUR SURGERY    . KNEE SURGERY    . MALONEY DILATION N/A 11/11/2013   Procedure: Venia Minks DILATION;  Surgeon: Rogene Houston, MD;  [facility];  Service: Endoscopy;  Laterality: N/A;  . OOPHORECTOMY    . SAVORY DILATION N/A 11/11/2013   Procedure: SAVORY DILATION;  Surgeon: Rogene Houston, MD;  [facility];  Service: Endoscopy;  Laterality: N/A;  . TONSILLECTOMY      Social History   Socioeconomic History  . Marital status: Widowed    Spouse name: Not on file  . Number of children: Not on file  . Years of education: Not on file  . Highest education level: Not on file  Occupational History  . Occupation: retired    Fish farm manager: RETIRED  Social Needs  . Financial resource strain: Not on file  . Food insecurity:    Worry: Not on file    Inability: Not on file  . Transportation needs:    Medical: Not on file    Non-medical: Not on file  Tobacco Use  . Smoking status: Former Smoker    Packs/day: 2.00    Years: 30.00    Pack years: 60.00    Types: Cigarettes    Start date: 05/05/1987  . Smokeless tobacco: Never Used  Substance and Sexual Activity  . Alcohol use: No    Alcohol/week: 0.0 oz  . Drug use: No  . Sexual activity: Never    Birth control/protection:  Abstinence  Lifestyle  . Physical activity:    Days per week: Not on file    Minutes per session: Not on file  . Stress: Not on file  Relationships  . Social connections:    Talks on phone: Not on file    Gets together: Not on file    Attends religious service: Not on file    Active member of club or organization: Not on file    Attends meetings of clubs or organizations: Not on file    Relationship status: Not on file  . Intimate partner violence:    Fear of current or ex partner: Not on file    Emotionally abused: Not on file    Physically abused: Not on file    Forced sexual activity: Not on file  Other Topics Concern  . Not on file  Social History Narrative  . Not on file     Vitals:   12/19/17 1326  BP: 126/76  Pulse: 60  SpO2: 96%  Weight: 216 lb (98 kg)  Height: 5\' 1"  (1.549 m)    Wt Readings from Last 3 Encounters:  12/19/17 216 lb (98 kg)  11/05/17 219 lb 9.6 oz (99.6 kg)  08/15/17 213 lb 0.6 oz (96.6 kg)     PHYSICAL EXAM General: NAD HEENT: Normal. Neck: No JVD, no thyromegaly. Lungs: Clear to auscultation bilaterally with normal respiratory effort. CV: Regular rate and rhythm, normal S1/S2, no S3/S4, no murmur. No pretibial or periankle edema. Abdomen: Soft, nontender, no distention.  Neurologic: Alert and oriented.  Psych: Normal affect. Skin: Normal. Musculoskeletal: No gross deformities.    ECG: Reviewed above under Subjective   Labs: Lab Results  Component Value Date/Time   K 4.4 11/05/2017 03:01 PM   BUN 30 (H) 11/05/2017 03:01 PM   CREATININE 1.18 (H) 11/05/2017 03:01 PM   CREATININE 1.50 (H) 02/01/2016 02:43 PM   ALT 12 11/05/2017 03:01  PM   TSH 6.400 (H) 11/05/2017 03:01 PM   HGB 12.8 08/12/2017 08:48 AM   HGB 12.9 04/14/2017 12:32 PM     Lipids: Lab Results  Component Value Date/Time   LDLCALC 72 04/14/2017 12:32 PM   CHOL 151 04/14/2017 12:32 PM   TRIG 102 04/14/2017 12:32 PM   HDL 59 04/14/2017 12:32 PM        ASSESSMENT AND PLAN: 1.  Coronary artery disease with a history of non-STEMI and prior percutaneous coronary interventions: Low risk nuclear stress test in 2016.  Symptomatically stable.  I will obtain an echocardiogram to evaluate interval changes in cardiac structure and function given recent ED evaluation for congestive heart failure.  Not on aspirin as she is on warfarin.  Continue Lipitor 40 mg and losartan 25 mg.  Heart rate is 60 bpm on long-acting diltiazem.  Hold off on beta-blockers.  2.  Hypertension: Blood pressures controlled.  No changes to therapy.  3.  Paroxysmal atrial fibrillation: Currently in a regular rhythm.  Symptomatically stable.  Continue long-acting diltiazem.  Systemically anticoagulated with warfarin.  No changes to therapy.  4.  Pulmonary embolism: No recurrences.  5.  Hyperlipidemia: Lipids reviewed above.  Continue Lipitor 40 mg.  6.  Chronic diastolic heart failure: Currently on Lasix 20 mg with intermittent efficacy.  I will increase to 40 mg daily and check a basic metabolic panel within a few days.  I will obtain an echocardiogram to evaluate for interval changes in cardiac structure and function.    Disposition: Follow up 6 months  Time spent: 40 minutes, of which greater than 50% was spent reviewing symptoms, relevant blood tests and studies, and discussing management plan with the patient.    Kate Sable, M.D., F.A.C.C.

## 2017-12-19 NOTE — Patient Instructions (Addendum)
Your physician wants you to follow-up in: 6 months with Dr.Koneswaran You will receive a reminder letter in the mail two months in advance. If you don't receive a letter, please call our office to schedule the follow-up appointment.    INCREASE Lasix to 40 mg daily    Get lab work : BMET next Tuesday  12/23/17    Your physician has requested that you have an echocardiogram. Echocardiography is a painless test that uses sound waves to create images of your heart. It provides your doctor with information about the size and shape of your heart and how well your heart's chambers and valves are working. This procedure takes approximately one hour. There are no restrictions for this procedure.     If you need a refill on your cardiac medications before your next appointment, please call your pharmacy.      Thank you for choosing [facility] !

## 2017-12-22 ENCOUNTER — Ambulatory Visit (INDEPENDENT_AMBULATORY_CARE_PROVIDER_SITE_OTHER): Payer: Medicare HMO | Admitting: Otolaryngology

## 2017-12-22 DIAGNOSIS — H608X3 Other otitis externa, bilateral: Secondary | ICD-10-CM | POA: Diagnosis not present

## 2017-12-22 DIAGNOSIS — H903 Sensorineural hearing loss, bilateral: Secondary | ICD-10-CM

## 2017-12-24 ENCOUNTER — Other Ambulatory Visit: Payer: Self-pay | Admitting: Cardiovascular Disease

## 2017-12-24 ENCOUNTER — Ambulatory Visit (INDEPENDENT_AMBULATORY_CARE_PROVIDER_SITE_OTHER): Payer: Medicare HMO

## 2017-12-24 ENCOUNTER — Ambulatory Visit (HOSPITAL_COMMUNITY)
Admission: RE | Admit: 2017-12-24 | Discharge: 2017-12-24 | Disposition: A | Discharge status: Home / Self Care | Payer: Medicare HMO | Source: Ambulatory Visit | Attending: Cardiovascular Disease | Admitting: Cardiovascular Disease

## 2017-12-24 ENCOUNTER — Telehealth: Payer: Self-pay | Admitting: *Deleted

## 2017-12-24 DIAGNOSIS — K219 Gastro-esophageal reflux disease without esophagitis: Secondary | ICD-10-CM | POA: Insufficient documentation

## 2017-12-24 DIAGNOSIS — E039 Hypothyroidism, unspecified: Secondary | ICD-10-CM | POA: Diagnosis not present

## 2017-12-24 DIAGNOSIS — I429 Cardiomyopathy, unspecified: Secondary | ICD-10-CM | POA: Insufficient documentation

## 2017-12-24 DIAGNOSIS — I4891 Unspecified atrial fibrillation: Secondary | ICD-10-CM | POA: Diagnosis not present

## 2017-12-24 DIAGNOSIS — I110 Hypertensive heart disease with heart failure: Secondary | ICD-10-CM | POA: Diagnosis not present

## 2017-12-24 DIAGNOSIS — D649 Anemia, unspecified: Secondary | ICD-10-CM | POA: Insufficient documentation

## 2017-12-24 DIAGNOSIS — Z86711 Personal history of pulmonary embolism: Secondary | ICD-10-CM | POA: Insufficient documentation

## 2017-12-24 DIAGNOSIS — Z79899 Other long term (current) drug therapy: Secondary | ICD-10-CM | POA: Diagnosis not present

## 2017-12-24 DIAGNOSIS — I5032 Chronic diastolic (congestive) heart failure: Secondary | ICD-10-CM | POA: Diagnosis not present

## 2017-12-24 DIAGNOSIS — Z87891 Personal history of nicotine dependence: Secondary | ICD-10-CM | POA: Insufficient documentation

## 2017-12-24 DIAGNOSIS — Z7901 Long term (current) use of anticoagulants: Secondary | ICD-10-CM

## 2017-12-24 LAB — POCT INR: INR: 3.5 — AB (ref 2.0–3.0)

## 2017-12-24 NOTE — Telephone Encounter (Signed)
-----   Message from Herminio Commons, MD sent at 12/24/2017  3:11 PM EDT ----- Normal pumping function. Stiffness with relaxation. Similar findings when compared to echo performed in April 2016.

## 2017-12-24 NOTE — Telephone Encounter (Signed)
Patient informed. 

## 2017-12-24 NOTE — Progress Notes (Signed)
*  PRELIMINARY RESULTS* Echocardiogram 2D Echocardiogram has been performed.  Leavy Cella 12/24/2017, 2:56 PM

## 2017-12-24 NOTE — Patient Instructions (Signed)
Take one tablet on all days except on Saturday. Take one half on saturday

## 2017-12-25 ENCOUNTER — Encounter: Payer: Self-pay | Admitting: Family Medicine

## 2017-12-25 LAB — TSH: TSH: 0.128 u[IU]/mL — ABNORMAL LOW (ref 0.450–4.500)

## 2017-12-25 LAB — BASIC METABOLIC PANEL
BUN/Creatinine Ratio: 20 (ref 12–28)
BUN: 18 mg/dL (ref 8–27)
CALCIUM: 9.5 mg/dL (ref 8.7–10.3)
CO2: 27 mmol/L (ref 20–29)
CREATININE: 0.88 mg/dL (ref 0.57–1.00)
Chloride: 99 mmol/L (ref 96–106)
GFR calc Af Amer: 72 mL/min/{1.73_m2} (ref >59)
GFR, EST NON AFRICAN AMERICAN: 63 mL/min/{1.73_m2} (ref >59)
Glucose: 81 mg/dL (ref 65–99)
Potassium: 4.1 mmol/L (ref 3.5–5.2)
Sodium: 141 mmol/L (ref 134–144)

## 2017-12-26 ENCOUNTER — Other Ambulatory Visit: Payer: Self-pay | Admitting: Nurse Practitioner

## 2017-12-26 DIAGNOSIS — E038 Other specified hypothyroidism: Secondary | ICD-10-CM

## 2017-12-30 ENCOUNTER — Other Ambulatory Visit: Payer: Self-pay | Admitting: Cardiovascular Disease

## 2018-01-21 ENCOUNTER — Ambulatory Visit (INDEPENDENT_AMBULATORY_CARE_PROVIDER_SITE_OTHER): Payer: Medicare HMO

## 2018-01-21 DIAGNOSIS — Z7901 Long term (current) use of anticoagulants: Secondary | ICD-10-CM | POA: Diagnosis not present

## 2018-01-21 LAB — POCT INR: INR: 2.1 (ref 2.0–3.0)

## 2018-01-21 NOTE — Patient Instructions (Signed)
Take one tablet each day except Saturday. On Saturday take one half tablet. Recheck in 4 weeks

## 2018-02-02 ENCOUNTER — Ambulatory Visit: Payer: Medicare HMO | Admitting: Family Medicine

## 2018-02-03 ENCOUNTER — Other Ambulatory Visit: Payer: Self-pay | Admitting: Cardiovascular Disease

## 2018-02-06 ENCOUNTER — Other Ambulatory Visit: Payer: Self-pay | Admitting: Family Medicine

## 2018-02-11 ENCOUNTER — Ambulatory Visit (INDEPENDENT_AMBULATORY_CARE_PROVIDER_SITE_OTHER): Payer: Medicare HMO | Admitting: Family Medicine

## 2018-02-11 ENCOUNTER — Encounter: Payer: Self-pay | Admitting: Family Medicine

## 2018-02-11 VITALS — BP 132/86 | Ht 61.0 in | Wt 219.6 lb

## 2018-02-11 DIAGNOSIS — E039 Hypothyroidism, unspecified: Secondary | ICD-10-CM

## 2018-02-11 DIAGNOSIS — E038 Other specified hypothyroidism: Secondary | ICD-10-CM

## 2018-02-11 DIAGNOSIS — Z7901 Long term (current) use of anticoagulants: Secondary | ICD-10-CM

## 2018-02-11 DIAGNOSIS — J45901 Unspecified asthma with (acute) exacerbation: Secondary | ICD-10-CM | POA: Diagnosis not present

## 2018-02-11 DIAGNOSIS — K219 Gastro-esophageal reflux disease without esophagitis: Secondary | ICD-10-CM

## 2018-02-11 DIAGNOSIS — E119 Type 2 diabetes mellitus without complications: Secondary | ICD-10-CM

## 2018-02-11 DIAGNOSIS — I5033 Acute on chronic diastolic (congestive) heart failure: Secondary | ICD-10-CM

## 2018-02-11 LAB — POCT GLYCOSYLATED HEMOGLOBIN (HGB A1C): HEMOGLOBIN A1C: 4.6 % (ref 4.0–5.6)

## 2018-02-11 MED ORDER — OXYCODONE HCL 10 MG PO TABS: 10.0000 mg | ORAL_TABLET | Freq: Two times a day (BID) | ORAL | 0 refills | Status: DC | PRN

## 2018-02-11 NOTE — Progress Notes (Signed)
Subjective:    Patient ID: [name], female    DOB: 04-Feb-1941, 77 y.o.   MRN: [id number] Patient arrives with tremendous number of concern Diabetes  She presents for her follow-up diabetic visit. She has type 2 diabetes mellitus. There are no hypoglycemic associated symptoms. There are no diabetic associated symptoms. There are no hypoglycemic complications. There are no diabetic complications. She does not see a podiatrist.Eye exam is not current.   This patient was seen today for chronic pain  The medication list was reviewed and updated.   -Compliance with medication: yes  - Number patient states they take daily: 1-2 depends on how much pain  -when was the last dose patient took? Last night  The patient was advised the importance of maintaining medication and not using illegal substances with these.  Here for refills and follow up  The patient was educated that we can provide 3 monthly scripts for their medication, it is their responsibility to follow the instructions.  Side effects or complications from medications: none  Patient is aware that pain medications are meant to minimize the severity of the pain to allow their pain levels to improve to allow for better function. They are aware of that pain medications cannot totally remove their pain.  Due for UDT ( at least once per year) : done 11/05/17   Pt states she has had a scratchy throat and her ears have been bothering her for a few days.    Asthma overall stable,   Notes breathing easier thes day s    Patient compliant with pain medication. Continues to experience the pain which led to initiation of analgesic intervention. No significant negative side effects. States definitely needs the pain medication to maintain current level of functioning. Does not receive controlled substance pain medication elsewhere.   Patient claims compliance with diabetes medication. No obvious side effects. Reports no substantial  low sugar spells. Most numbers are generally in good range when checked fasting. Generally does not miss a dose of medication. Watching diabetic diet closely  Patient notes ongoing compliance with antidepressant medication. No obvious side effects. Reports does not miss a dose. Overall continues to help depression substantially. No thoughts of homicide or suicide. Would like to maintain medication.  Notes right lateral chesp pain , sharp, comes and goes, worse with motion, occurred when sliding a heaby chair   Patient continues to take lipid medication regularly. No obvious side effects from it. Generally does not miss a dose. Prior blood work results are reviewed with patient. Patient continues to work on fat intake in diet  chonic antiocoaulatio        Pain in side4.6 Results for orders placed or performed in visit on 02/11/18  POCT HgB A1C  Result Value Ref Range   Hemoglobin A1C 4.6 4.0 - 5.6 %   HbA1c POC (<> result, manual entry)     HbA1c, POC (prediabetic range)     HbA1c, POC (controlled diabetic range)      Review of Systems No headache, no major weight loss or weight gain, no chest pain no back pain abdominal pain no change in bowel habits complete ROS otherwise negative     Objective:   Physical Exam  Alert and oriented, vitals reviewed and stable, somewhat anxious appearing.  Blood pressure good on repeat ENT-TM's and ext canals WNL bilat via otoscopic exam Soft palate, tonsils and post pharynx WNL via oropharyngeal exam Neck-symmetric, no masses; thyroid nonpalpable and nontender Pulmonary-no tachypnea  or accessory muscle use; Clear without wheezes via auscultation Card--no abnrml murmurs, rhythm reg and rate WNL Carotid pulses symmetric, without bruits       Assessment & Plan:  Impression: Chronic pain. Patient compliant with medication. No substantial side effects. [place] controlled substance registry reviewed to ensure compliance and proper use of  medication. Patient aware goal of medicine is not complete resolution of pain but to control his symptoms to improve his functional capacity. Aware of potential adverse side effects  #2.  Type 2 diabetes.  Control good appetite.  No hypoglycemic episodes diet exercise discussed  3.  Hypertension.  Good control discussed maintain same meds  4.  Asthma.  Generally stable.  Patient uses albuterol rarely.  5.  Recent viral syndrome.  Symptom care discussed  6.  Chest pain.  Likely musculoskeletal nature rationale discussed  7.  Depression clinically stable to maintain same meds  8.  Chronic anticoagulation.  Discussed.  To maintain same dose  Follow-up in several months medication refill\discussed  Greater than 50% of this 40 minute face to face visit was spent in counseling and discussion and coordination of care regarding the above diagnosis/diagnosies

## 2018-02-18 ENCOUNTER — Ambulatory Visit: Payer: Medicare HMO

## 2018-02-19 ENCOUNTER — Telehealth (INDEPENDENT_AMBULATORY_CARE_PROVIDER_SITE_OTHER): Payer: Self-pay | Admitting: Internal Medicine

## 2018-02-19 ENCOUNTER — Ambulatory Visit (INDEPENDENT_AMBULATORY_CARE_PROVIDER_SITE_OTHER): Payer: Medicare HMO | Admitting: Internal Medicine

## 2018-02-19 NOTE — Telephone Encounter (Signed)
Patient had an appointment and had to cancel because of severe diarrhea - patient would like you to call her at [phone]

## 2018-02-19 NOTE — Telephone Encounter (Signed)
OTC fiber 4 gms daily. Lomotil twice a day

## 2018-02-23 ENCOUNTER — Other Ambulatory Visit: Payer: Self-pay | Admitting: Family Medicine

## 2018-02-24 ENCOUNTER — Encounter (INDEPENDENT_AMBULATORY_CARE_PROVIDER_SITE_OTHER): Payer: Self-pay | Admitting: Internal Medicine

## 2018-02-24 ENCOUNTER — Ambulatory Visit (INDEPENDENT_AMBULATORY_CARE_PROVIDER_SITE_OTHER): Payer: Medicare HMO | Admitting: Internal Medicine

## 2018-02-25 ENCOUNTER — Ambulatory Visit (INDEPENDENT_AMBULATORY_CARE_PROVIDER_SITE_OTHER): Payer: Medicare HMO | Admitting: *Deleted

## 2018-02-25 ENCOUNTER — Other Ambulatory Visit (INDEPENDENT_AMBULATORY_CARE_PROVIDER_SITE_OTHER): Payer: Self-pay | Admitting: Internal Medicine

## 2018-02-25 DIAGNOSIS — Z7901 Long term (current) use of anticoagulants: Secondary | ICD-10-CM | POA: Diagnosis not present

## 2018-02-25 LAB — POCT INR: INR: 1.7 — AB (ref 2.0–3.0)

## 2018-02-25 NOTE — Patient Instructions (Signed)
Coumadin 5mg  tablets. Take one tablet every day. Recheck INR in 4 weeks

## 2018-02-26 ENCOUNTER — Other Ambulatory Visit: Payer: Self-pay | Admitting: Family Medicine

## 2018-02-27 DIAGNOSIS — R69 Illness, unspecified: Secondary | ICD-10-CM | POA: Diagnosis not present

## 2018-03-02 ENCOUNTER — Other Ambulatory Visit: Payer: Self-pay | Admitting: Family Medicine

## 2018-03-03 ENCOUNTER — Ambulatory Visit (INDEPENDENT_AMBULATORY_CARE_PROVIDER_SITE_OTHER): Payer: Medicare HMO | Admitting: Family Medicine

## 2018-03-03 ENCOUNTER — Encounter: Payer: Self-pay | Admitting: Family Medicine

## 2018-03-03 VITALS — BP 128/78 | Ht 61.0 in | Wt 221.0 lb

## 2018-03-03 DIAGNOSIS — L89321 Pressure ulcer of left buttock, stage 1: Secondary | ICD-10-CM | POA: Diagnosis not present

## 2018-03-03 DIAGNOSIS — E038 Other specified hypothyroidism: Secondary | ICD-10-CM | POA: Diagnosis not present

## 2018-03-03 DIAGNOSIS — L89311 Pressure ulcer of right buttock, stage 1: Secondary | ICD-10-CM | POA: Diagnosis not present

## 2018-03-03 NOTE — Progress Notes (Signed)
   Subjective:    Patient ID: [name], female    DOB: 1941/01/30, 77 y.o.   MRN: [id number]  HPI Pt here today due to bed sores. Noticed these about 2 months Pt has multiple areas on bottom. Pt states they are painful when she sits down. She sleeps in her lift chair because she cant sleep laying down. Pt states she constantly pees on herself and her IBS acts up a lot. Pt also states she has been itching all over. Pt states she is using betadine on sores.   Constantly in lift chair, sitting and sleeping d/t asthma and heart failure has to sleep sitting up.  Reports needing to get a wedge to help her sleep in the bed. Has about 3-4 sores over the last two months.  Incontinent of urine x 3 months.  Sometimes not able to get to bathroom for BM, sometimes has diarrhea.  States added fiber to diet recently which has helped. No known fevers. She is taking oxycodone but does not help with pain d/t sores on buttocks.  Review of Systems  Constitutional: Negative for fever.  Gastrointestinal: Positive for diarrhea.  Genitourinary:       Positive for urinary incontinence.   Skin: Positive for wound (sores to bilateral buttocks).       Objective:   Physical Exam  Constitutional: She is oriented to person, place, and time. She is cooperative. No distress.  HENT:  Head: Normocephalic and atraumatic.  Eyes: Right eye exhibits no discharge. Left eye exhibits no discharge.  Cardiovascular: Normal rate and regular rhythm.  Pulmonary/Chest: Effort normal and breath sounds normal. No respiratory distress.  Neurological: She is alert and oriented to person, place, and time.  Skin: Skin is warm and dry.  3 pressure sores noted to buttocks. 1 is noted to right of rectum and 1 to left side of rectum, gray in color, appears to be healing, no signs of infection.  1 small red/pink nodule noted to left buttocks, no sign of infection.  Vitals reviewed.     Assessment & Plan:  Pressure injury of left buttock,  stage 1  Pressure injury of right buttock, stage 1  Home health consulted for management of Pressure sores and medical supplies to prevent sores while in lift chair and wedge for bed vs. Hospital bed at home. She has her regularly scheduled f/u with Dr. Richardson Landry in November, but will f/u before then as needed if her symptoms worsen.    As attending physician to this patient visit, this patient was seen in conjunction with the nurse practitioner.  The history,physical and treatment plan was reviewed with the nurse practitioner and pertinent findings were verified with the patient.  Also the treatment plan was reviewed with the patient while they were present. I did review over her history and physical as well as I looked that the pressure sores which are healing but she would benefit from extra padding and some help with her home consult home health Face-to-face evaluation was done today Patient bedridden/home written She would benefit from having to go in her lift chair to lessen bedsores she would also benefit from possibly a hospital bed or a wedge that would allow her to sleep with her standard bed to minimize the risk of pressure sores

## 2018-03-03 NOTE — Telephone Encounter (Signed)
Six mo worth all

## 2018-03-03 NOTE — Patient Instructions (Addendum)
During the day sitting on a donut ring will be helpful to relieve pressure off your bottom.  We will put in a referral to home health to help you get the medical supplies you need.

## 2018-03-04 ENCOUNTER — Other Ambulatory Visit: Payer: Self-pay | Admitting: *Deleted

## 2018-03-04 ENCOUNTER — Other Ambulatory Visit: Payer: Self-pay

## 2018-03-04 LAB — TSH: TSH: 1.48 u[IU]/mL (ref 0.450–4.500)

## 2018-03-04 MED ORDER — FLUTICASONE PROPIONATE HFA 110 MCG/ACT IN AERO: 2.0000 | INHALATION_SPRAY | Freq: Two times a day (BID) | RESPIRATORY_TRACT | 5 refills | Status: DC

## 2018-03-04 MED ORDER — ESCITALOPRAM OXALATE 20 MG PO TABS: 20.0000 mg | ORAL_TABLET | Freq: Every day | ORAL | 5 refills | Status: DC

## 2018-03-04 MED ORDER — LEVOTHYROXINE SODIUM 75 MCG PO TABS: 75.0000 ug | ORAL_TABLET | Freq: Every day | ORAL | 1 refills | Status: DC

## 2018-03-04 MED ORDER — WARFARIN SODIUM 5 MG PO TABS: ORAL_TABLET | ORAL | 5 refills | Status: DC

## 2018-03-04 MED ORDER — ALBUTEROL SULFATE HFA 108 (90 BASE) MCG/ACT IN AERS: INHALATION_SPRAY | RESPIRATORY_TRACT | 5 refills | Status: DC

## 2018-03-04 NOTE — Telephone Encounter (Signed)
Still six months all

## 2018-03-06 DIAGNOSIS — L89322 Pressure ulcer of left buttock, stage 2: Secondary | ICD-10-CM | POA: Diagnosis not present

## 2018-03-06 DIAGNOSIS — L89312 Pressure ulcer of right buttock, stage 2: Secondary | ICD-10-CM | POA: Diagnosis not present

## 2018-03-06 DIAGNOSIS — Z79891 Long term (current) use of opiate analgesic: Secondary | ICD-10-CM | POA: Diagnosis not present

## 2018-03-06 DIAGNOSIS — J45909 Unspecified asthma, uncomplicated: Secondary | ICD-10-CM | POA: Diagnosis not present

## 2018-03-06 DIAGNOSIS — I509 Heart failure, unspecified: Secondary | ICD-10-CM | POA: Diagnosis not present

## 2018-03-06 DIAGNOSIS — Z9181 History of falling: Secondary | ICD-10-CM | POA: Diagnosis not present

## 2018-03-06 DIAGNOSIS — Z7901 Long term (current) use of anticoagulants: Secondary | ICD-10-CM | POA: Diagnosis not present

## 2018-03-06 DIAGNOSIS — R32 Unspecified urinary incontinence: Secondary | ICD-10-CM | POA: Diagnosis not present

## 2018-03-09 ENCOUNTER — Telehealth: Payer: Self-pay | Admitting: Family Medicine

## 2018-03-09 NOTE — Telephone Encounter (Signed)
Pharmacy stated she last filled potassium 07/2017 (has refills) Protonix last filled 05/2016

## 2018-03-09 NOTE — Telephone Encounter (Signed)
[name] at [facility] needing a verbal order approving visits.  First visit was Friday.  Needs verbal for 1 visit 1st week, 2 visits 2nd week.  (Patient saw Ria Comment last week for bed sores).  Also said she is missing 3 prescriptions that are still listed as current meds on her list from our office. Patient didn't seem to know anything about them. Nurse needs to know if she should be currently taking these: Potassiu, Chloride 20,  Pantoprazole 40, and Albuterol Neb. Solution.

## 2018-03-09 NOTE — Telephone Encounter (Signed)
Stop both then, and rx albuterol with refills 6

## 2018-03-09 NOTE — Telephone Encounter (Signed)
Ok we need to ck and see if pt has been getting these meds from the parmacy, definitely needs albuterol answer for others depoends on whether shes been getting

## 2018-03-09 NOTE — Telephone Encounter (Signed)
Verbal orders given to [facility] at [facility]

## 2018-03-11 DIAGNOSIS — L89312 Pressure ulcer of right buttock, stage 2: Secondary | ICD-10-CM | POA: Diagnosis not present

## 2018-03-11 DIAGNOSIS — Z7901 Long term (current) use of anticoagulants: Secondary | ICD-10-CM | POA: Diagnosis not present

## 2018-03-11 DIAGNOSIS — Z79891 Long term (current) use of opiate analgesic: Secondary | ICD-10-CM | POA: Diagnosis not present

## 2018-03-11 DIAGNOSIS — L89322 Pressure ulcer of left buttock, stage 2: Secondary | ICD-10-CM | POA: Diagnosis not present

## 2018-03-11 DIAGNOSIS — R32 Unspecified urinary incontinence: Secondary | ICD-10-CM | POA: Diagnosis not present

## 2018-03-11 DIAGNOSIS — I509 Heart failure, unspecified: Secondary | ICD-10-CM | POA: Diagnosis not present

## 2018-03-11 DIAGNOSIS — J45909 Unspecified asthma, uncomplicated: Secondary | ICD-10-CM | POA: Diagnosis not present

## 2018-03-11 DIAGNOSIS — Z9181 History of falling: Secondary | ICD-10-CM | POA: Diagnosis not present

## 2018-03-14 DIAGNOSIS — Z79891 Long term (current) use of opiate analgesic: Secondary | ICD-10-CM | POA: Diagnosis not present

## 2018-03-14 DIAGNOSIS — R32 Unspecified urinary incontinence: Secondary | ICD-10-CM | POA: Diagnosis not present

## 2018-03-14 DIAGNOSIS — I509 Heart failure, unspecified: Secondary | ICD-10-CM | POA: Diagnosis not present

## 2018-03-14 DIAGNOSIS — J45909 Unspecified asthma, uncomplicated: Secondary | ICD-10-CM | POA: Diagnosis not present

## 2018-03-14 DIAGNOSIS — Z7901 Long term (current) use of anticoagulants: Secondary | ICD-10-CM | POA: Diagnosis not present

## 2018-03-14 DIAGNOSIS — Z9181 History of falling: Secondary | ICD-10-CM | POA: Diagnosis not present

## 2018-03-14 DIAGNOSIS — L89312 Pressure ulcer of right buttock, stage 2: Secondary | ICD-10-CM | POA: Diagnosis not present

## 2018-03-14 DIAGNOSIS — L89322 Pressure ulcer of left buttock, stage 2: Secondary | ICD-10-CM | POA: Diagnosis not present

## 2018-03-17 DIAGNOSIS — J45909 Unspecified asthma, uncomplicated: Secondary | ICD-10-CM | POA: Diagnosis not present

## 2018-03-17 DIAGNOSIS — Z9181 History of falling: Secondary | ICD-10-CM | POA: Diagnosis not present

## 2018-03-17 DIAGNOSIS — L89322 Pressure ulcer of left buttock, stage 2: Secondary | ICD-10-CM | POA: Diagnosis not present

## 2018-03-17 DIAGNOSIS — Z7901 Long term (current) use of anticoagulants: Secondary | ICD-10-CM | POA: Diagnosis not present

## 2018-03-17 DIAGNOSIS — L89312 Pressure ulcer of right buttock, stage 2: Secondary | ICD-10-CM | POA: Diagnosis not present

## 2018-03-17 DIAGNOSIS — Z79891 Long term (current) use of opiate analgesic: Secondary | ICD-10-CM | POA: Diagnosis not present

## 2018-03-17 DIAGNOSIS — I509 Heart failure, unspecified: Secondary | ICD-10-CM | POA: Diagnosis not present

## 2018-03-17 DIAGNOSIS — R32 Unspecified urinary incontinence: Secondary | ICD-10-CM | POA: Diagnosis not present

## 2018-03-18 ENCOUNTER — Ambulatory Visit (INDEPENDENT_AMBULATORY_CARE_PROVIDER_SITE_OTHER): Payer: Medicare HMO | Admitting: Internal Medicine

## 2018-03-18 ENCOUNTER — Encounter (INDEPENDENT_AMBULATORY_CARE_PROVIDER_SITE_OTHER): Payer: Self-pay | Admitting: Internal Medicine

## 2018-03-18 ENCOUNTER — Telehealth: Payer: Self-pay | Admitting: Family Medicine

## 2018-03-18 NOTE — Telephone Encounter (Signed)
Fell asleep and forgot to take night time meds last night.  Should she take this morning? Specifically her Coumadin?

## 2018-03-18 NOTE — Telephone Encounter (Signed)
No, just skip those

## 2018-03-18 NOTE — Telephone Encounter (Signed)
Contacted patient and informed her to skip those meds that she forgot to take. Pt verbalized understanding.

## 2018-03-19 DIAGNOSIS — Z9181 History of falling: Secondary | ICD-10-CM | POA: Diagnosis not present

## 2018-03-19 DIAGNOSIS — R32 Unspecified urinary incontinence: Secondary | ICD-10-CM | POA: Diagnosis not present

## 2018-03-19 DIAGNOSIS — I509 Heart failure, unspecified: Secondary | ICD-10-CM | POA: Diagnosis not present

## 2018-03-19 DIAGNOSIS — J45909 Unspecified asthma, uncomplicated: Secondary | ICD-10-CM | POA: Diagnosis not present

## 2018-03-19 DIAGNOSIS — Z79891 Long term (current) use of opiate analgesic: Secondary | ICD-10-CM | POA: Diagnosis not present

## 2018-03-19 DIAGNOSIS — Z7901 Long term (current) use of anticoagulants: Secondary | ICD-10-CM | POA: Diagnosis not present

## 2018-03-19 DIAGNOSIS — L89312 Pressure ulcer of right buttock, stage 2: Secondary | ICD-10-CM | POA: Diagnosis not present

## 2018-03-19 DIAGNOSIS — L89322 Pressure ulcer of left buttock, stage 2: Secondary | ICD-10-CM | POA: Diagnosis not present

## 2018-03-24 ENCOUNTER — Other Ambulatory Visit: Payer: Self-pay | Admitting: Family Medicine

## 2018-03-25 ENCOUNTER — Other Ambulatory Visit (INDEPENDENT_AMBULATORY_CARE_PROVIDER_SITE_OTHER): Payer: Self-pay | Admitting: Internal Medicine

## 2018-03-26 ENCOUNTER — Other Ambulatory Visit (INDEPENDENT_AMBULATORY_CARE_PROVIDER_SITE_OTHER): Payer: Self-pay | Admitting: Internal Medicine

## 2018-03-26 ENCOUNTER — Ambulatory Visit: Payer: Medicare HMO

## 2018-03-31 ENCOUNTER — Telehealth: Payer: Self-pay | Admitting: Family Medicine

## 2018-03-31 ENCOUNTER — Ambulatory Visit: Payer: Medicare HMO

## 2018-03-31 NOTE — Telephone Encounter (Signed)
Please advise 

## 2018-03-31 NOTE — Telephone Encounter (Signed)
Patient is aware 

## 2018-03-31 NOTE — Telephone Encounter (Signed)
warfarin (COUMADIN) 5 MG tablet losartan (COZAAR) 25 MG tablet  diltiazem (CARDIZEM CD) 180 MG 24 hr capsule escitalopram (LEXAPRO) 20 MG tablet   Pt forgot to take her medications last night and she wants to know if she should take them this morning and also if she can move them to morning instead of taking them at night.

## 2018-03-31 NOTE — Telephone Encounter (Signed)
Take extra coum this morn otherwise skip doses, keep with night time with all that she is on,

## 2018-04-02 ENCOUNTER — Ambulatory Visit: Payer: Medicare HMO

## 2018-04-14 ENCOUNTER — Ambulatory Visit: Payer: Medicare HMO

## 2018-04-14 ENCOUNTER — Ambulatory Visit: Payer: Medicare HMO | Admitting: Family Medicine

## 2018-04-14 ENCOUNTER — Other Ambulatory Visit: Payer: Self-pay | Admitting: Family Medicine

## 2018-04-14 MED ORDER — MECLIZINE HCL 25 MG PO TABS: 25.0000 mg | ORAL_TABLET | Freq: Three times a day (TID) | ORAL | 0 refills | Status: DC | PRN

## 2018-04-15 ENCOUNTER — Other Ambulatory Visit: Payer: Self-pay | Admitting: Family Medicine

## 2018-04-18 DIAGNOSIS — R69 Illness, unspecified: Secondary | ICD-10-CM | POA: Diagnosis not present

## 2018-04-20 DIAGNOSIS — J45909 Unspecified asthma, uncomplicated: Secondary | ICD-10-CM

## 2018-04-20 DIAGNOSIS — R32 Unspecified urinary incontinence: Secondary | ICD-10-CM

## 2018-04-20 DIAGNOSIS — L89322 Pressure ulcer of left buttock, stage 2: Secondary | ICD-10-CM

## 2018-04-20 DIAGNOSIS — I509 Heart failure, unspecified: Secondary | ICD-10-CM

## 2018-04-20 DIAGNOSIS — L89312 Pressure ulcer of right buttock, stage 2: Secondary | ICD-10-CM

## 2018-04-24 ENCOUNTER — Emergency Department (HOSPITAL_COMMUNITY)
Admission: EM | Admit: 2018-04-24 | Discharge: 2018-04-24 | Disposition: A | Discharge status: Home / Self Care | Payer: Medicare HMO | Attending: Emergency Medicine | Admitting: Emergency Medicine

## 2018-04-24 ENCOUNTER — Other Ambulatory Visit (INDEPENDENT_AMBULATORY_CARE_PROVIDER_SITE_OTHER): Payer: Self-pay | Admitting: Internal Medicine

## 2018-04-24 ENCOUNTER — Encounter (HOSPITAL_COMMUNITY): Payer: Self-pay

## 2018-04-24 ENCOUNTER — Telehealth: Payer: Self-pay | Admitting: *Deleted

## 2018-04-24 ENCOUNTER — Ambulatory Visit: Payer: Medicare HMO

## 2018-04-24 ENCOUNTER — Other Ambulatory Visit: Payer: Self-pay

## 2018-04-24 ENCOUNTER — Emergency Department (HOSPITAL_COMMUNITY): Payer: Medicare HMO

## 2018-04-24 DIAGNOSIS — J45909 Unspecified asthma, uncomplicated: Secondary | ICD-10-CM | POA: Diagnosis not present

## 2018-04-24 DIAGNOSIS — N182 Chronic kidney disease, stage 2 (mild): Secondary | ICD-10-CM | POA: Diagnosis not present

## 2018-04-24 DIAGNOSIS — I252 Old myocardial infarction: Secondary | ICD-10-CM | POA: Insufficient documentation

## 2018-04-24 DIAGNOSIS — E039 Hypothyroidism, unspecified: Secondary | ICD-10-CM | POA: Diagnosis not present

## 2018-04-24 DIAGNOSIS — Z79899 Other long term (current) drug therapy: Secondary | ICD-10-CM | POA: Diagnosis not present

## 2018-04-24 DIAGNOSIS — Z87891 Personal history of nicotine dependence: Secondary | ICD-10-CM | POA: Diagnosis not present

## 2018-04-24 DIAGNOSIS — K5732 Diverticulitis of large intestine without perforation or abscess without bleeding: Secondary | ICD-10-CM | POA: Diagnosis not present

## 2018-04-24 DIAGNOSIS — R1084 Generalized abdominal pain: Secondary | ICD-10-CM | POA: Diagnosis not present

## 2018-04-24 DIAGNOSIS — R1011 Right upper quadrant pain: Secondary | ICD-10-CM | POA: Diagnosis present

## 2018-04-24 DIAGNOSIS — Z7901 Long term (current) use of anticoagulants: Secondary | ICD-10-CM | POA: Insufficient documentation

## 2018-04-24 DIAGNOSIS — R52 Pain, unspecified: Secondary | ICD-10-CM | POA: Diagnosis not present

## 2018-04-24 DIAGNOSIS — K5792 Diverticulitis of intestine, part unspecified, without perforation or abscess without bleeding: Secondary | ICD-10-CM

## 2018-04-24 DIAGNOSIS — F329 Major depressive disorder, single episode, unspecified: Secondary | ICD-10-CM | POA: Diagnosis not present

## 2018-04-24 DIAGNOSIS — I129 Hypertensive chronic kidney disease with stage 1 through stage 4 chronic kidney disease, or unspecified chronic kidney disease: Secondary | ICD-10-CM | POA: Insufficient documentation

## 2018-04-24 DIAGNOSIS — I2510 Atherosclerotic heart disease of native coronary artery without angina pectoris: Secondary | ICD-10-CM | POA: Insufficient documentation

## 2018-04-24 DIAGNOSIS — E1122 Type 2 diabetes mellitus with diabetic chronic kidney disease: Secondary | ICD-10-CM | POA: Insufficient documentation

## 2018-04-24 DIAGNOSIS — R58 Hemorrhage, not elsewhere classified: Secondary | ICD-10-CM | POA: Diagnosis not present

## 2018-04-24 DIAGNOSIS — R69 Illness, unspecified: Secondary | ICD-10-CM | POA: Diagnosis not present

## 2018-04-24 LAB — COMPREHENSIVE METABOLIC PANEL
ALT: 13 U/L (ref 0–44)
AST: 20 U/L (ref 15–41)
Albumin: 3.3 g/dL — ABNORMAL LOW (ref 3.5–5.0)
Alkaline Phosphatase: 66 U/L (ref 38–126)
Anion gap: 8 (ref 5–15)
BUN: 11 mg/dL (ref 8–23)
CO2: 28 mmol/L (ref 22–32)
Calcium: 8.9 mg/dL (ref 8.9–10.3)
Chloride: 105 mmol/L (ref 98–111)
Creatinine, Ser: 0.78 mg/dL (ref 0.44–1.00)
GFR calc Af Amer: >60 mL/min (ref >60)
GFR calc non Af Amer: >60 mL/min (ref >60)
Glucose, Bld: 147 mg/dL — ABNORMAL HIGH (ref 70–99)
Potassium: 3.7 mmol/L (ref 3.5–5.1)
Sodium: 141 mmol/L (ref 135–145)
Total Bilirubin: 0.8 mg/dL (ref 0.3–1.2)
Total Protein: 6.9 g/dL (ref 6.5–8.1)

## 2018-04-24 LAB — URINALYSIS, ROUTINE W REFLEX MICROSCOPIC
Bilirubin Urine: NEGATIVE
Glucose, UA: NEGATIVE mg/dL
Ketones, ur: NEGATIVE mg/dL
Nitrite: NEGATIVE
Protein, ur: NEGATIVE mg/dL
Specific Gravity, Urine: 1.029 (ref 1.005–1.030)
WBC, UA: >50 WBC/hpf — ABNORMAL HIGH (ref 0–5)
pH: 6 (ref 5.0–8.0)

## 2018-04-24 LAB — CBC
HCT: 40 % (ref 36.0–46.0)
Hemoglobin: 12.3 g/dL (ref 12.0–15.0)
MCH: 26.8 pg (ref 26.0–34.0)
MCHC: 30.8 g/dL (ref 30.0–36.0)
MCV: 87.1 fL (ref 80.0–100.0)
Platelets: 274 10*3/uL (ref 150–400)
RBC: 4.59 MIL/uL (ref 3.87–5.11)
RDW: 16.1 % — ABNORMAL HIGH (ref 11.5–15.5)
WBC: 7.2 10*3/uL (ref 4.0–10.5)
nRBC: 0 % (ref 0.0–0.2)

## 2018-04-24 LAB — LIPASE, BLOOD: Lipase: 25 U/L (ref 11–51)

## 2018-04-24 LAB — PROTIME-INR
INR: 3.23
PROTHROMBIN TIME: 32.5 s — AB (ref 11.4–15.2)

## 2018-04-24 LAB — POC OCCULT BLOOD, ED: Fecal Occult Bld: POSITIVE — AB

## 2018-04-24 MED ORDER — METRONIDAZOLE 500 MG PO TABS: 500.0000 mg | ORAL_TABLET | Freq: Three times a day (TID) | ORAL | 0 refills | Status: DC

## 2018-04-24 MED ORDER — IOPAMIDOL (ISOVUE-300) INJECTION 61%
100.0000 mL | Freq: Once | INTRAVENOUS | Status: AC | PRN
  Administered 2018-04-24: 100 mL via INTRAVENOUS

## 2018-04-24 MED ORDER — CIPROFLOXACIN HCL 500 MG PO TABS: 500.0000 mg | ORAL_TABLET | Freq: Two times a day (BID) | ORAL | 0 refills | Status: DC

## 2018-04-24 NOTE — ED Triage Notes (Addendum)
Pt reports right lower abdominal pain since yesterday. Pt reports that she had a BM since am and once she wiped she noticed red blood on tissue. States she had been constipated. Pt reports she is on coumadin due to hx of multiple PE's

## 2018-04-24 NOTE — Telephone Encounter (Signed)
Pt called and states she suppose to come in today for INR check but she is having severe pain on right lower side, cannot walk due to pain and she saw some blood when she wiped after urination. Advised pt she needs to go to ED. She states she will call her friend to take her and if unable to get her friend she will call 911.

## 2018-04-24 NOTE — Telephone Encounter (Signed)
correct 

## 2018-04-24 NOTE — ED Provider Notes (Signed)
[facility] EMERGENCY DEPARTMENT Provider Note   CSN: [id number] Arrival date & time: 04/24/18  0946  History   Chief Complaint Chief Complaint  Patient presents with  . Abdominal Pain    HPI  [name] is a 77 y.o. female with a hx of afib chronically anticoagulated on warfarin, CAD, DM, GERD, IBS, prior pulmonary embolism, who is s/p cholecystectomy, appendectomy, and hysterectomy presenting to the ED with complaint of abdominal pain and rectal bleed today. Patient states that she has bouts of diarrhea/constipation with her IBS. Relays that prior to today she felt she was having some constipation with her last BM being 2 days prior. Today she was able to have a bowel movement which was loose stool, not quite watery diarrhea. She states that when she wiped she noted bright red blood on the toilet paper- not in bowl or mixed in stool, no melena noted. She states that this frightened her. This has not reoccurred. She states she also noted abdominal pain today- RUQ/RLQ, dull ache at rest, but much worse with movement. She states her abdominal pain today is not her typical IBS abdominal discomfort. Reports baseline nausea which she has each morning without vomiting, no change from usual. Denies fever, chills, chest pain, dyspnea, or dysuria. States similar more sever episode of similar in 2015- this was her last colonoscopy. Denies recent foreign travel, abx, or hospitalizations.    HPI  Past Medical History:  Diagnosis Date  . Allergy   . Allergy history unknown   . Arthritis   . ASCVD (arteriosclerotic cardiovascular disease)    stent to mid and proximal left anterior descending in 06/2002;drug eluting stent placed in the second diagnol in 08/2003 after  A  non-st elevation myocardial infarction   . Asthma   . Atrial fibrillation ([place])   . Chronic anticoagulation   . Chronic nausea   . Chronic pain of left knee   . Coronary artery disease   . Current chronic use of systemic steroids  05/11/2016  . Depression   . Diabetes mellitus    no insulin  . Diarrhea    acute  . FH: colonic polyps    adenomataous  . GERD (gastroesophageal reflux disease)   . History of kidney stones   . Hyperlipidemia    pulmonary embolism 2000 and 09/2008  . Hypertension   . Hypothyroidism   . Insomnia   . Irritable bowel syndrome   . Myocardial infarction ([place])   . Paroxysmal atrial fibrillation (H[facility])    normal LV function; episodes occurred in 205 and 09/2007  . Pedal edema   . Peripheral edema   . PONV (postoperative nausea and vomiting)   . Pulmonary embolism ([place])    2000/09/2008  . Rectal bleeding   . Sleep apnea   . Thyroid disease    hypothyroidism  . Tobacco user    stopped   . Venous stasis     Patient Active Problem List   Diagnosis Date Noted  . Nausea vomiting and diarrhea 05/11/2016  . Obesity 05/11/2016  . 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 Insomnia 04/15/2016  . Hyperkalemia 04/08/2016  . Volume depletion 04/08/2016  . AKI (acute kidney injury) ([place]) 04/08/2016  . Depression 04/07/2016  . Nausea without vomiting 05/09/2014  . Melena 05/08/2014  . UGI bleed 05/08/2014  . Dysphagia, unspecified(787.20) 10/26/2013  . Difficulty in walking(719.7) 10/19/2012  . Weakness of left leg 10/19/2012  . Long term current use of anticoagulant therapy 09/26/2012  .  Dysphagia 05/04/2012  . Microcytic anemia 08/10/2011  . Acute respiratory failure ([place]) 08/09/2011  . IBS (irritable bowel syndrome) 08/09/2011  . DOE (dyspnea on exertion) 03/05/2011  . Anemia 03/05/2011  . Peripheral neuropathy 10/02/2010  . 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 DIARRHEA, ACUTE 12/05/2008  . Hypothyroidism 12/02/2008  . Controlled type 2 diabetes mellitus ([place]) 12/02/2008  . Hyperlipidemia LDL goal <70 12/02/2008  . Essential hypertension 12/02/2008  . Coronary atherosclerosis 12/02/2008  . Chronic atrial fibrillation  12/02/2008  . Asthma 12/02/2008  . GERD 12/02/2008  . TOBACCO USE, QUIT 12/02/2008    Past Surgical History:  Procedure Laterality Date  . ABDOMINAL HYSTERECTOMY    . APPENDECTOMY    . BALLOON DILATION N/A 11/11/2013   Procedure: BALLOON DILATION;  Surgeon: Rogene Houston, MD;  Location: [facility];  Service: Endoscopy;  Laterality: N/A;  . BREAST REDUCTION SURGERY    . BREAST SURGERY    . CHOLECYSTECTOMY    . CLEFT PALATE REPAIR     4 surgeries  . COLONOSCOPY  2011   negative  . COLONOSCOPY N/A 05/10/2014   Procedure: COLONOSCOPY;  Surgeon: Rogene Houston, MD;  Location: [facility];  Service: Endoscopy;  Laterality: N/A;  . ESOPHAGEAL DILATION N/A 03/08/2016   Procedure: ESOPHAGEAL DILATION;  Surgeon: Rogene Houston, MD;  Location: [facility];  Service: Endoscopy;  Laterality: N/A;  . ESOPHAGOGASTRODUODENOSCOPY N/A 11/11/2013   Procedure: ESOPHAGOGASTRODUODENOSCOPY (EGD);  Surgeon: Rogene Houston, MD;  Location: [facility];  Service: Endoscopy;  Laterality: N/A;  200-moved to 100 Ann notified pt  . ESOPHAGOGASTRODUODENOSCOPY N/A 05/09/2014   Procedure: ESOPHAGOGASTRODUODENOSCOPY (EGD);  Surgeon: Rogene Houston, MD;  [facility];  Service: Endoscopy;  Laterality: N/A;  . ESOPHAGOGASTRODUODENOSCOPY N/A 03/08/2016   Procedure: ESOPHAGOGASTRODUODENOSCOPY (EGD);  Surgeon: Rogene Houston, MD;  [facility];  Service: Endoscopy;  Laterality: N/A;  2:20  . HEEL SPUR SURGERY    . KNEE SURGERY    . MALONEY DILATION N/A 11/11/2013   Procedure: Venia Minks DILATION;  Surgeon: Rogene Houston, MD;  Location: [facility];  Service: Endoscopy;  Laterality: N/A;  . OOPHORECTOMY    . SAVORY DILATION N/A 11/11/2013   Procedure: SAVORY DILATION;  Surgeon: Rogene Houston, MD;  [facility]: [facility];  Service: Endoscopy;  Laterality: N/A;  . TONSILLECTOMY       OB History   None      Home Medications    Prior to Admission medications   Medication Sig  Start Date End Date Taking? Authorizing Provider  acetaminophen (TYLENOL) 500 MG tablet Take 500 mg by mouth daily as needed for headache.    [provider]  albuterol (PROVENTIL) (2.5 MG/3ML) 0.083% nebulizer solution Take 3 mLs (2.5 mg total) by nebulization every 6 (six) hours as needed for wheezing or shortness of breath. 06/05/16   Mikey Kirschner, MD  albuterol (VENTOLIN HFA) 108 (90 Base) MCG/ACT inhaler INHALE 2 PUFFS INTO THE LUNGS 4 TIMES DAILY AS NEEDED FOR WHEEZING. 03/04/18   Mikey Kirschner, MD  albuterol (VENTOLIN HFA) 108 (90 Base) MCG/ACT inhaler INHALE 2 PUFFS INTO THE LUNGS 4 TIMES DAILY AS NEEDED FOR WHEEZING. 03/04/18   Mikey Kirschner, MD  atorvastatin (LIPITOR) 40 MG tablet TAKE ONE TABLET BY MOUTH DAILY. 12/24/17   Herminio Commons, MD  Cholecalciferol (VITAMIN D3) 1000 units CAPS Take 1 capsule every evening by mouth.  [provider]  diltiazem (CARDIZEM CD) 180 MG 24 hr capsule TAKE ONE CAPSULE BY MOUTH DAILY. 08/15/16   Herminio Commons, MD  diphenoxylate-atropine (LOMOTIL) 2.5-0.025 MG tablet TAKE ONE TABLET BY MOUTH 2 TIMES A DAY AS NEEDED. 03/26/18   Setzer, Rona Ravens, NP  escitalopram (LEXAPRO) 20 MG tablet TAKE ONE TABLET BY MOUTH ONCE DAILY. 03/04/18   Mikey Kirschner, MD  escitalopram (LEXAPRO) 20 MG tablet Take 1 tablet (20 mg total) by mouth daily. 03/04/18   Mikey Kirschner, MD  FLOVENT HFA 110 MCG/ACT inhaler INHALE 2 PUFFS TWICE DAILY. 03/04/18   Mikey Kirschner, MD  fluticasone (FLOVENT HFA) 110 MCG/ACT inhaler Inhale 2 puffs into the lungs 2 (two) times daily. 03/04/18   Mikey Kirschner, MD  fluticasone (FLOVENT HFA) 220 MCG/ACT inhaler Inhale 2 puffs into the lungs 2 (two) times daily. 01/01/17   Mikey Kirschner, MD  furosemide (LASIX) 40 MG tablet Take 1 tablet (40 mg total) by mouth daily. 12/19/17   Herminio Commons, MD  hydrocortisone 2.5 % cream Apply topically 2 (two) times daily. 04/14/17   Mikey Kirschner, MD    levothyroxine (SYNTHROID, LEVOTHROID) 75 MCG tablet Take 1 tablet (75 mcg total) by mouth daily. 03/04/18   Mikey Kirschner, MD  losartan (COZAAR) 25 MG tablet TAKE 1 TABLET BY MOUTH ONCE DAILY. 02/03/18   Herminio Commons, MD  meclizine (ANTIVERT) 25 MG tablet Take 1 tablet (25 mg total) by mouth 3 (three) times daily as needed for dizziness. 04/14/18   Mikey Kirschner, MD  nitroGLYCERIN (NITROLINGUAL) 0.4 MG/SPRAY spray Place 1 spray under the tongue every 5 (five) minutes x 3 doses as needed for chest pain. 06/04/16   Herminio Commons, MD  nitroGLYCERIN (NITROSTAT) 0.4 MG SL tablet PLACE 1 TAB UNDER THE TONGUE EVERY 5 MINUTES FOR 3 DOSES AS NEEDED FOR CHEST PAIN. 12/30/17   Herminio Commons, MD  ONE TOUCH ULTRA TEST test strip USE TO TEST BLOOD SUGAR ONCE DAILY. 03/24/18   Mikey Kirschner, MD  Oxycodone HCl 10 MG TABS Take 1 tablet (10 mg total) by mouth 2 (two) times daily as needed (FOR PAIN). 02/11/18   Mikey Kirschner, MD  pantoprazole (PROTONIX) 40 MG tablet Take 1 tablet (40 mg total) by mouth 2 (two) times daily before a meal. Patient taking differently: Take 40 mg 2 (two) times daily as needed by mouth (FOR ACID REFLUX).  02/23/16   Setzer, Rona Ravens, NP  Potassium Chloride ER 20 MEQ TBCR One po daily 08/15/17   Mikey Kirschner, MD  predniSONE (DELTASONE) 20 MG tablet 2 tabs po daily x 4 days 08/12/17   Mesner, Corene Cornea, MD  promethazine (PHENERGAN) 12.5 MG tablet TAKE (1) TABLET BY MOUTH EVERY SIX HOURS AS NEEDED FOR NAUSEA/VOMITING. 05/05/17   Mikey Kirschner, MD  warfarin (COUMADIN) 5 MG tablet TAKE ONE TABLET BY MOUTH ONCE DAILY. TAKE AS DIRECTED BY DOCTOR. 03/04/18   Mikey Kirschner, MD  warfarin (COUMADIN) 5 MG tablet TAKE ONE TABLET BY MOUTH ONCE DAILY. TAKE AS DIRECTED BY DOCTOR. 03/04/18   Mikey Kirschner, MD  ferrous sulfate 325 (65 FE) MG tablet Take 1 tablet (325 mg total) by mouth daily with breakfast. IRON SUPPLEMENT. 08/12/11 09/13/11  Rexene Alberts, MD   ipratropium (ATROVENT) 0.02 % nebulizer solution Take 500 mcg by nebulization every 4 (four) hours as needed. For shortness of breath  09/13/11  [provider]    [facility]  History Family History  Problem Relation Age of Onset  . Heart attack Mother   . Diabetes Mother   . Hyperlipidemia Mother   . Heart attack Father   . Lung cancer Sister   . Lymphoma Brother   . Colon cancer Neg Hx     Social History Social History   Tobacco Use  . Smoking status: Former Smoker    Packs/day: 2.00    Years: 30.00    Pack years: 60.00    Types: Cigarettes    Start date: 05/05/1987  . Smokeless tobacco: Never Used  Substance Use Topics  . Alcohol use: No    Alcohol/week: 0.0 standard drinks  . Drug use: No     Allergies   Cefzil [cefprozil] and Demerol   Review of Systems Review of Systems  Constitutional: Negative for chills and fever.  Respiratory: Negative for shortness of breath.   Cardiovascular: Negative for chest pain.  Gastrointestinal: Positive for abdominal pain, anal bleeding, constipation, diarrhea and nausea. Negative for vomiting.  Genitourinary: Negative for dysuria.  All other systems reviewed and are negative.    Physical Exam Updated Vital Signs BP (!) 183/79 (BP Location: Left Wrist)   Pulse 64   Temp 98.7 F (37.1 C) (Oral)   Wt 93 kg   SpO2 94%   BMI 38.73 kg/m   Physical Exam  Constitutional: She appears well-developed and well-nourished.  Non-toxic appearance. No distress.  HENT:  Head: Normocephalic and atraumatic.  Eyes: Conjunctivae are normal. Right eye exhibits no discharge. Left eye exhibits no discharge.  Neck: Neck supple.  Cardiovascular: Normal rate and regular rhythm.  Pulmonary/Chest: Effort normal and breath sounds normal. No respiratory distress. She has no wheezes. She has no rhonchi. She has no rales.  Respiration even and unlabored  Abdominal: Soft. Bowel sounds are normal. She exhibits no distension. There is  tenderness in the right upper quadrant and right lower quadrant. There is no rigidity, no rebound and no guarding.  Genitourinary: Rectal exam shows guaiac positive stool. Rectal exam shows no external hemorrhoid and no tenderness. Pelvic exam was performed with patient in the knee-chest position.  Genitourinary Comments: No bright red blood noted. Soft brown stool in rectal vault. No gross melena. RN present as chaperone for assessment.   Neurological: She is alert.  Clear speech.   Skin: Skin is warm and dry. No rash noted.  Psychiatric: She has a normal mood and affect. Her behavior is normal.  Nursing note and vitals reviewed.   ED Treatments / Results  Labs (all labs ordered are listed, but only abnormal results are displayed) Labs Reviewed  COMPREHENSIVE METABOLIC PANEL - Abnormal; Notable for the following components:      Result Value   Glucose, Bld 147 (*)    Albumin 3.3 (*)    All other components within normal limits  CBC - Abnormal; Notable for the following components:   RDW 16.1 (*)    All other components within normal limits  PROTIME-INR - Abnormal; Notable for the following components:   Prothrombin Time 32.5 (*)    All other components within normal limits  POC OCCULT BLOOD, ED - Abnormal; Notable for the following components:   Fecal Occult Bld POSITIVE (*)    All other components within normal limits  LIPASE, BLOOD  URINALYSIS, ROUTINE W REFLEX MICROSCOPIC    EKG None  Radiology Ct Abdomen Pelvis W Contrast  Result Date: 04/24/2018 CLINICAL DATA:  Acute right-sided abdominal pain. EXAM: CT ABDOMEN AND PELVIS  WITH CONTRAST TECHNIQUE: Multidetector CT imaging of the abdomen and pelvis was performed using the standard protocol following bolus administration of intravenous contrast. CONTRAST:  140mL ISOVUE-300 IOPAMIDOL (ISOVUE-300) INJECTION 61% COMPARISON:  CT scan of May 10, 2016. FINDINGS: Lower chest: No acute abnormality. Hepatobiliary: No focal liver  abnormality is seen. Status post cholecystectomy. No biliary dilatation. Pancreas: Unremarkable. No pancreatic ductal dilatation or surrounding inflammatory changes. Spleen: Normal in size without focal abnormality. Adrenals/Urinary Tract: Adrenal glands appear normal. Stable bilateral renal cysts are noted. Nonobstructive left renal calculus is noted. No hydronephrosis or renal obstruction is noted. Urinary bladder is unremarkable. Stomach/Bowel: The stomach appears normal. Status post appendectomy. Focal diverticulitis of sigmoid colon is noted. No abscess formation is noted. Vascular/Lymphatic: Aortic atherosclerosis. No enlarged abdominal or pelvic lymph nodes. Reproductive: Status post hysterectomy. No adnexal masses. Other: No abdominal wall hernia or abnormality. No abdominopelvic ascites. Musculoskeletal: No acute or significant osseous findings. IMPRESSION: Focal sigmoid diverticulitis.  No abscess formation is noted. Nonobstructive left renal calculus. Aortic Atherosclerosis (ICD10-I70.0). Electronically Signed   By: Marijo Conception, M.D.   On: 04/24/2018 12:16    Procedures Procedures (including critical care time)  Medications Ordered in ED Medications - No data to display   Prior Results Reviewed:   Findings:   Prep excellent. 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 All of these  lesions were ablated with argon plasma coagulator. Single small diverticulum at descending colon along with scattered diverticula at sigmoid colon. 4 mm polyp ablated via cold biopsy from sigmoid colon. Random biopsies taken from normal-appearing mucosa of sigmoid colon. Normal rectal mucosa. Small hemorrhoids below the dentate line.  Initial Impression / Assessment and Plan / ED Course  I have reviewed the triage vital signs and the nursing notes.  Pertinent labs & imaging results that were available during  my care of the patient were reviewed by me and considered in my medical decision making (see chart for details).   Patient presents to the ED for abdominal pain and 1 episode of blood on toilet paper after wiping. She has baseline nausea and bouts of diarrhea/constipation with her IBS which are unchanged. Vitals notable for intermittent mild brachycardia and elevated BP- doubt HTn emergency, patient aware of need for recheck. On exam she has tenderness to RUQ/RLQ without peritoneal signs and rectal exam without bright red blood or melena noted, fecal occult positive. Further evaluate with labs and CT abdomen/pelvis. Prior colonoscopy note reviewed from 2015 noted to have AV malformations with stigmata which were ablated. She also was noted to have a polyp which was removed as well as diverticula and small hemorrhoids.  Labs reviewed and grossly unremarkable. No leukocytosis, no anemia, no significant electrolyte derangements. LFTs, renal function, and lipase WNL. Urinalysis with leukocytes/WBCs, rare bacteria, no urinary sxs, will culture.   CT abdomen pelvis with focal sigmoid diverticulitis.  No abscess formation is noted. Patient does not meet sepsis criteria. Suspect diverticulitis vs. Hemorrhoid as etiology of blood on toilet paper this AM. No repeat episodes of bleeding while in the ER. Patient hgb/hct consistent with prior visit, no signs of symptomatic anemia denies dyspnea/CP/dizziness/lightheadendss. Discussed all findings and plan of care with supervising physician Dr. Wilson Singer- will discharge home with abx and pain control with close follow up with her GI doctor and strict return precautions. Discussed with pharmacy regarding abx selection with patient's med list recommend cipro/flagyl coverage. I discussed  results, treatment plan, need for PCP/GI follow-up, and return precautions with the patient who is pleased with plan for discharge. She states she has narcotic medications prescribed to her at home  which she can take for pain. Provided opportunity for questions, patient confirmed understanding and is in agreement with plan.    Final Clinical Impressions(s) / ED Diagnoses   Final diagnoses:  Diverticulitis    ED Discharge Orders         Ordered    ciprofloxacin (CIPRO) 500 MG tablet  Every 12 hours     04/24/18 1256    metroNIDAZOLE (FLAGYL) 500 MG tablet  3 times daily     04/24/18 [address] [place], PA-C 04/24/18 1258    Virgel Manifold, MD 04/24/18 1453

## 2018-04-24 NOTE — ED Notes (Signed)
Patient transported to X-ray 

## 2018-04-24 NOTE — Discharge Instructions (Signed)
You were seen in the emergency he was seen in the emergency department today for abdominal pain with an episode of rectal bleeding.  Your labs in the ER were fairly similar to prior you have had.  Your CT scan shows findings of diverticulitis, please see the attached handout for further information on this diagnosis.  We are placing you on antibiotics, ciprofloxacin and Flagyl to treat this infection.  Please take your At Home oxycodone as needed for pain.  Please take all of your antibiotics until finished. You may develop abdominal discomfort or diarrhea from the antibiotic.  You may help offset this with probiotics which you can buy at the store (ask your pharmacist if unable to find) or get probiotics in the form of eating yogurt. Do not eat or take the probiotics until 2 hours after your antibiotic. If you are unable to tolerate these side effects follow-up with your primary care provider or return to the emergency department.   If you begin to experience any blistering, rashes, swelling, or difficulty breathing seek medical care for evaluation of potentially more serious side effects.   Please be aware that this medication may interact with other medications you are taking, please be sure to discuss your medication list with your pharmacist.  If on coumadin the antibiotic will effect your coumadin level-please discuss with the pharmacist and the provider who manages your Coumadin.  We would like you to follow-up with your primary care provider as well as your GI specialist in the next 3 to 5 days.  Return to the ER for new or worsening symptoms including but not limited to worsening pain, repeated episodes of blood in the stool, inability to keep fluids down, fever, or any other concerns.   Additionally have your blood pressure rechecked by the primary care provider as it was elevated in the ER today.

## 2018-04-25 ENCOUNTER — Emergency Department (HOSPITAL_COMMUNITY)
Admission: EM | Admit: 2018-04-25 | Discharge: 2018-04-25 | Disposition: A | Discharge status: Home / Self Care | Payer: Medicare HMO | Attending: Emergency Medicine | Admitting: Emergency Medicine

## 2018-04-25 ENCOUNTER — Encounter (HOSPITAL_COMMUNITY): Payer: Self-pay | Admitting: Emergency Medicine

## 2018-04-25 ENCOUNTER — Emergency Department (HOSPITAL_COMMUNITY): Payer: Medicare HMO

## 2018-04-25 ENCOUNTER — Other Ambulatory Visit: Payer: Self-pay

## 2018-04-25 DIAGNOSIS — E1122 Type 2 diabetes mellitus with diabetic chronic kidney disease: Secondary | ICD-10-CM | POA: Insufficient documentation

## 2018-04-25 DIAGNOSIS — Z7901 Long term (current) use of anticoagulants: Secondary | ICD-10-CM | POA: Insufficient documentation

## 2018-04-25 DIAGNOSIS — R109 Unspecified abdominal pain: Secondary | ICD-10-CM | POA: Diagnosis not present

## 2018-04-25 DIAGNOSIS — J45909 Unspecified asthma, uncomplicated: Secondary | ICD-10-CM | POA: Diagnosis not present

## 2018-04-25 DIAGNOSIS — I252 Old myocardial infarction: Secondary | ICD-10-CM | POA: Diagnosis not present

## 2018-04-25 DIAGNOSIS — Z79899 Other long term (current) drug therapy: Secondary | ICD-10-CM | POA: Insufficient documentation

## 2018-04-25 DIAGNOSIS — I10 Essential (primary) hypertension: Secondary | ICD-10-CM | POA: Diagnosis not present

## 2018-04-25 DIAGNOSIS — I480 Paroxysmal atrial fibrillation: Secondary | ICD-10-CM | POA: Diagnosis not present

## 2018-04-25 DIAGNOSIS — Z87891 Personal history of nicotine dependence: Secondary | ICD-10-CM | POA: Insufficient documentation

## 2018-04-25 DIAGNOSIS — E039 Hypothyroidism, unspecified: Secondary | ICD-10-CM | POA: Insufficient documentation

## 2018-04-25 DIAGNOSIS — E785 Hyperlipidemia, unspecified: Secondary | ICD-10-CM | POA: Diagnosis not present

## 2018-04-25 DIAGNOSIS — N182 Chronic kidney disease, stage 2 (mild): Secondary | ICD-10-CM | POA: Insufficient documentation

## 2018-04-25 DIAGNOSIS — R110 Nausea: Secondary | ICD-10-CM

## 2018-04-25 DIAGNOSIS — Z86718 Personal history of other venous thrombosis and embolism: Secondary | ICD-10-CM | POA: Diagnosis not present

## 2018-04-25 DIAGNOSIS — R079 Chest pain, unspecified: Secondary | ICD-10-CM | POA: Diagnosis not present

## 2018-04-25 DIAGNOSIS — I129 Hypertensive chronic kidney disease with stage 1 through stage 4 chronic kidney disease, or unspecified chronic kidney disease: Secondary | ICD-10-CM | POA: Insufficient documentation

## 2018-04-25 DIAGNOSIS — I2510 Atherosclerotic heart disease of native coronary artery without angina pectoris: Secondary | ICD-10-CM | POA: Diagnosis not present

## 2018-04-25 DIAGNOSIS — I4891 Unspecified atrial fibrillation: Secondary | ICD-10-CM | POA: Diagnosis not present

## 2018-04-25 DIAGNOSIS — R0602 Shortness of breath: Secondary | ICD-10-CM | POA: Diagnosis not present

## 2018-04-25 HISTORY — DX: Diverticulitis of intestine, part unspecified, without perforation or abscess without bleeding: K57.92

## 2018-04-25 LAB — HEPATIC FUNCTION PANEL
ALBUMIN: 3.5 g/dL (ref 3.5–5.0)
ALT: 15 U/L (ref 0–44)
AST: 23 U/L (ref 15–41)
Alkaline Phosphatase: 68 U/L (ref 38–126)
BILIRUBIN INDIRECT: 0.7 mg/dL (ref 0.3–0.9)
Bilirubin, Direct: 0.1 mg/dL (ref 0.0–0.2)
TOTAL PROTEIN: 7.3 g/dL (ref 6.5–8.1)
Total Bilirubin: 0.8 mg/dL (ref 0.3–1.2)

## 2018-04-25 LAB — CBC
HEMATOCRIT: 43.2 % (ref 36.0–46.0)
HEMOGLOBIN: 13.2 g/dL (ref 12.0–15.0)
MCH: 26.1 pg (ref 26.0–34.0)
MCHC: 30.6 g/dL (ref 30.0–36.0)
MCV: 85.5 fL (ref 80.0–100.0)
Platelets: 278 10*3/uL (ref 150–400)
RBC: 5.05 MIL/uL (ref 3.87–5.11)
RDW: 16 % — ABNORMAL HIGH (ref 11.5–15.5)
WBC: 8.3 10*3/uL (ref 4.0–10.5)
nRBC: 0 % (ref 0.0–0.2)

## 2018-04-25 LAB — PROTIME-INR
INR: 3.31
Prothrombin Time: 33.1 seconds — ABNORMAL HIGH (ref 11.4–15.2)

## 2018-04-25 LAB — BRAIN NATRIURETIC PEPTIDE: B NATRIURETIC PEPTIDE 5: 201 pg/mL — AB (ref 0.0–100.0)

## 2018-04-25 LAB — BASIC METABOLIC PANEL
ANION GAP: 10 (ref 5–15)
BUN: 10 mg/dL (ref 8–23)
CO2: 27 mmol/L (ref 22–32)
Calcium: 9 mg/dL (ref 8.9–10.3)
Chloride: 105 mmol/L (ref 98–111)
Creatinine, Ser: 0.75 mg/dL (ref 0.44–1.00)
GFR calc Af Amer: >60 mL/min (ref >60)
GFR calc non Af Amer: >60 mL/min (ref >60)
Glucose, Bld: 115 mg/dL — ABNORMAL HIGH (ref 70–99)
POTASSIUM: 3.5 mmol/L (ref 3.5–5.1)
Sodium: 142 mmol/L (ref 135–145)

## 2018-04-25 LAB — TROPONIN I: Troponin I: <0.03 ng/mL (ref <0.03)

## 2018-04-25 MED ORDER — ONDANSETRON HCL 4 MG/2ML IJ SOLN: 4.0000 mg | Freq: Once | INTRAMUSCULAR | Status: DC

## 2018-04-25 MED ORDER — ONDANSETRON 4 MG PO TBDP: ORAL_TABLET | ORAL | 0 refills | Status: DC

## 2018-04-25 NOTE — Discharge Instructions (Addendum)
Follow-up with your doctor as planned.  Continue taking her antibiotic

## 2018-04-25 NOTE — ED Triage Notes (Addendum)
Patient brought in via EMS from home. Alert and oriented. Airway patent. Paramedic called out for chest pain upon their arrival. Patient c/o shortness of breath with excretion, tightness in chest. Patient reports excessive amount of urination. Patient does take Lasix. Hx of a-fib. Paramedic states A-fib on monitor with rate of 109. Patient treated here in ER yesterday and given medications for Diverticulitis.

## 2018-04-25 NOTE — ED Provider Notes (Signed)
[facility] EMERGENCY DEPARTMENT Provider Note   CSN: [id number] Arrival date & time: 04/25/18  1448     History   Chief Complaint Chief Complaint  Patient presents with  . Chest Pain    HPI Somer P Myhre is a 77 y.o. female.  Patient complains of nausea.  Patient was diagnosed with diverticulitis yesterday and was started on some antibiotics.  She also has complained of a little shortness of breath  The history is provided by the patient. No language interpreter was used.  Illness  This is a new problem. The current episode started 12 to 24 hours ago. The problem occurs constantly. The problem has not changed since onset.Associated symptoms include abdominal pain. Pertinent negatives include no chest pain and no headaches. Nothing aggravates the symptoms. Nothing relieves the symptoms. She has tried nothing for the symptoms. The treatment provided no relief.    Past Medical History:  Diagnosis Date  . Allergy   . Allergy history unknown   . Arthritis   . ASCVD (arteriosclerotic cardiovascular disease)    stent to mid and proximal left anterior descending in 06/2002;drug eluting stent placed in the second diagnol in 08/2003 after  A  non-st elevation myocardial infarction   . Asthma   . Atrial fibrillation ([place])   . Chronic anticoagulation   . Chronic nausea   . Chronic pain of left knee   . Coronary artery disease   . Current chronic use of systemic steroids 05/11/2016  . Depression   . Diabetes mellitus    no insulin  . Diarrhea    acute  . Diverticulitis of intestine   . FH: colonic polyps    adenomataous  . GERD (gastroesophageal reflux disease)   . History of kidney stones   . Hyperlipidemia    pulmonary embolism 2000 and 09/2008  . Hypertension   . Hypothyroidism   . Insomnia   . Irritable bowel syndrome   . Myocardial infarction ([place])   . Paroxysmal atrial fibrillation (H[facility])    normal LV function; episodes occurred in 205 and 09/2007  . Pedal edema   .  Peripheral edema   . PONV (postoperative nausea and vomiting)   . Pulmonary embolism ([place])    2000/09/2008  . Rectal bleeding   . Sleep apnea   . Thyroid disease    hypothyroidism  . Tobacco user    stopped   . Venous stasis     Patient Active Problem List   Diagnosis Date Noted  . Nausea vomiting and diarrhea 05/11/2016  . Obesity 05/11/2016  . 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 Insomnia 04/15/2016  . Hyperkalemia 04/08/2016  . Volume depletion 04/08/2016  . AKI (acute kidney injury) ([place]) 04/08/2016  . Depression 04/07/2016  . Nausea without vomiting 05/09/2014  . Melena 05/08/2014  . UGI bleed 05/08/2014  . Dysphagia, unspecified(787.20) 10/26/2013  . Difficulty in walking(719.7) 10/19/2012  . Weakness of left leg 10/19/2012  . Long term current use of anticoagulant therapy 09/26/2012  . Dysphagia 05/04/2012  . Microcytic anemia 08/10/2011  . Acute respiratory failure ([place]) 08/09/2011  . IBS (irritable bowel syndrome) 08/09/2011  . DOE (dyspnea on exertion) 03/05/2011  . Anemia 03/05/2011  . Peripheral neuropathy 10/02/2010  . 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 DIARRHEA, ACUTE 12/05/2008  . Hypothyroidism 12/02/2008  . Controlled type 2 diabetes mellitus ([place]) 12/02/2008  . Hyperlipidemia LDL goal <70 12/02/2008  . Essential hypertension 12/02/2008  . Coronary atherosclerosis  12/02/2008  . Chronic atrial fibrillation 12/02/2008  . Asthma 12/02/2008  . GERD 12/02/2008  . TOBACCO USE, QUIT 12/02/2008    Past Surgical History:  Procedure Laterality Date  . ABDOMINAL HYSTERECTOMY    . APPENDECTOMY    . BALLOON DILATION N/A 11/11/2013   Procedure: BALLOON DILATION;  Surgeon: Rogene Houston, MD;  Location: [facility] ENDO [facility];  Service: Endoscopy;  Laterality: N/A;  . BREAST REDUCTION SURGERY    . BREAST SURGERY    . CHOLECYSTECTOMY    . CLEFT PALATE REPAIR     4 surgeries  . COLONOSCOPY  2011   negative    . COLONOSCOPY N/A 05/10/2014   Procedure: COLONOSCOPY;  Surgeon: Rogene Houston, MD;  Location: [facility] ENDO SU[facility];  Service: Endoscopy;  Laterality: N/A;  . ESOPHAGEAL DILATION N/A 03/08/2016   Procedure: ESOPHAGEAL DILATION;  Surgeon: Rogene Houston, MD;  Location: [facility];  Service: Endoscopy;  Laterality: N/A;  . ESOPHAGOGASTRODUODENOSCOPY N/A 11/11/2013   Procedure: ESOPHAGOGASTRODUODENOSCOPY (EGD);  Surgeon: Rogene Houston, MD;  Location: [facility];  Service: Endoscopy;  Laterality: N/A;  200-moved to 100 Ann notified pt  . ESOPHAGOGASTRODUODENOSCOPY N/A 05/09/2014   Procedure: ESOPHAGOGASTRODUODENOSCOPY (EGD);  Surgeon: Rogene Houston, MD;  [facility];  Service: Endoscopy;  Laterality: N/A;  . ESOPHAGOGASTRODUODENOSCOPY N/A 03/08/2016   Procedure: ESOPHAGOGASTRODUODENOSCOPY (EGD);  Surgeon: Rogene Houston, MD;  Location: [facility];  Service: Endoscopy;  Laterality: N/A;  2:20  . HEEL SPUR SURGERY    . KNEE SURGERY    . MALONEY DILATION N/A 11/11/2013   Procedure: Venia Minks DILATION;  Surgeon: Rogene Houston, MD;  Location: [facility];  Service: Endoscopy;  Laterality: N/A;  . OOPHORECTOMY    . SAVORY DILATION N/A 11/11/2013   Procedure: SAVORY DILATION;  Surgeon: Rogene Houston, MD;  Location: [facility];  Service: Endoscopy;  Laterality: N/A;  . TONSILLECTOMY       OB History   None      Home Medications    Prior to Admission medications   Medication Sig Start Date End Date Taking? Authorizing Provider  acetaminophen (TYLENOL) 500 MG tablet Take 500 mg by mouth daily as needed for headache.   Yes [provider]  albuterol (PROVENTIL) (2.5 MG/3ML) 0.083% nebulizer solution Take 3 mLs (2.5 mg total) by nebulization every 6 (six) hours as needed for wheezing or shortness of breath. 06/05/16  Yes Mikey Kirschner, MD  albuterol (VENTOLIN HFA) 108 (90 Base) MCG/ACT inhaler INHALE 2 PUFFS INTO THE LUNGS 4 TIMES DAILY AS NEEDED FOR WHEEZING.  03/04/18  Yes Mikey Kirschner, MD  albuterol (VENTOLIN HFA) 108 (90 Base) MCG/ACT inhaler INHALE 2 PUFFS INTO THE LUNGS 4 TIMES DAILY AS NEEDED FOR WHEEZING. 03/04/18  Yes Mikey Kirschner, MD  atorvastatin (LIPITOR) 40 MG tablet TAKE ONE TABLET BY MOUTH DAILY. 12/24/17  Yes Herminio Commons, MD  Cholecalciferol (VITAMIN D3) 1000 units CAPS Take 1 capsule every evening by mouth.    Yes [provider]  diltiazem (CARDIZEM CD) 180 MG 24 hr capsule TAKE ONE CAPSULE BY MOUTH DAILY. 08/15/16  Yes Herminio Commons, MD  diphenoxylate-atropine (LOMOTIL) 2.5-0.025 MG tablet TAKE ONE TABLET BY MOUTH 2 TIMES A DAY AS NEEDED. 03/26/18  Yes Setzer, Terri L, NP  escitalopram (LEXAPRO) 20 MG tablet TAKE ONE TABLET BY MOUTH ONCE DAILY. 03/04/18  Yes Mikey Kirschner, MD  fluticasone (FLOVENT HFA) 110 MCG/ACT inhaler Inhale 2 puffs into the  lungs 2 (two) times daily. 03/04/18  Yes Mikey Kirschner, MD  furosemide (LASIX) 40 MG tablet Take 1 tablet (40 mg total) by mouth daily. 12/19/17  Yes Herminio Commons, MD  hydrocortisone 2.5 % cream Apply topically 2 (two) times daily. 04/14/17  Yes Mikey Kirschner, MD  levothyroxine (SYNTHROID, LEVOTHROID) 75 MCG tablet Take 1 tablet (75 mcg total) by mouth daily. 03/04/18  Yes Mikey Kirschner, MD  losartan (COZAAR) 25 MG tablet TAKE 1 TABLET BY MOUTH ONCE DAILY. 02/03/18  Yes Herminio Commons, MD  meclizine (ANTIVERT) 25 MG tablet Take 1 tablet (25 mg total) by mouth 3 (three) times daily as needed for dizziness. 04/14/18  Yes Mikey Kirschner, MD  metroNIDAZOLE (FLAGYL) 500 MG tablet Take 1 tablet (500 mg total) by mouth 3 (three) times daily. 04/24/18  Yes Petrucelli, Samantha R, PA-C  nitroGLYCERIN (NITROSTAT) 0.4 MG SL tablet PLACE 1 TAB UNDER THE TONGUE EVERY 5 MINUTES FOR 3 DOSES AS NEEDED FOR CHEST PAIN. 12/30/17  Yes Herminio Commons, MD  ONE TOUCH ULTRA TEST test strip USE TO TEST BLOOD SUGAR ONCE DAILY. 03/24/18  Yes Mikey Kirschner, MD    Oxycodone HCl 10 MG TABS Take 1 tablet (10 mg total) by mouth 2 (two) times daily as needed (FOR PAIN). 02/11/18  Yes Mikey Kirschner, MD  Potassium Chloride ER 20 MEQ TBCR One po daily 08/15/17  Yes Mikey Kirschner, MD  promethazine (PHENERGAN) 12.5 MG tablet TAKE (1) TABLET BY MOUTH EVERY SIX HOURS AS NEEDED FOR NAUSEA/VOMITING. 05/05/17  Yes Mikey Kirschner, MD  warfarin (COUMADIN) 5 MG tablet TAKE ONE TABLET BY MOUTH ONCE DAILY. TAKE AS DIRECTED BY DOCTOR. 03/04/18  Yes Mikey Kirschner, MD  ciprofloxacin (CIPRO) 500 MG tablet Take 1 tablet (500 mg total) by mouth every 12 (twelve) hours. 04/24/18   Petrucelli, Samantha R, PA-C  escitalopram (LEXAPRO) 20 MG tablet Take 1 tablet (20 mg total) by mouth daily. Patient not taking: Reported on 04/25/2018 03/04/18   Mikey Kirschner, MD  fluticasone (FLOVENT HFA) 220 MCG/ACT inhaler Inhale 2 puffs into the lungs 2 (two) times daily. Patient not taking: Reported on 04/25/2018 01/01/17   Mikey Kirschner, MD  nitroGLYCERIN (NITROLINGUAL) 0.4 MG/SPRAY spray Place 1 spray under the tongue every 5 (five) minutes x 3 doses as needed for chest pain. Patient not taking: Reported on 04/25/2018 06/04/16   Herminio Commons, MD  ondansetron (ZOFRAN ODT) 4 MG disintegrating tablet 4mg  ODT q4 hours prn nausea/vomit 04/25/18   Milton Ferguson, MD  pantoprazole (PROTONIX) 40 MG tablet Take 1 tablet (40 mg total) by mouth 2 (two) times daily before a meal. Patient not taking: Reported on 04/25/2018 02/23/16   Butch Penny, NP  warfarin (COUMADIN) 5 MG tablet TAKE ONE TABLET BY MOUTH ONCE DAILY. TAKE AS DIRECTED BY DOCTOR. Patient not taking: Reported on 04/25/2018 03/04/18   Mikey Kirschner, MD  ferrous sulfate 325 (65 FE) MG tablet Take 1 tablet (325 mg total) by mouth daily with breakfast. IRON SUPPLEMENT. 08/12/11 09/13/11  Rexene Alberts, MD  ipratropium (ATROVENT) 0.02 % nebulizer solution Take 500 mcg by nebulization every 4 (four) hours as needed. For  shortness of breath  09/13/11  [provider]    Family History Family History  Problem Relation Age of Onset  . Heart attack Mother   . Diabetes Mother   . Hyperlipidemia Mother   . Heart attack Father   . Lung cancer Sister   .  Lymphoma Brother   . Colon cancer Neg Hx     Social History Social History   Tobacco Use  . Smoking status: Former Smoker    Packs/day: 2.00    Years: 30.00    Pack years: 60.00    Types: Cigarettes    Start date: 05/05/1987  . Smokeless tobacco: Never Used  Substance Use Topics  . Alcohol use: No    Alcohol/week: 0.0 standard drinks  . Drug use: No     Allergies   Cefzil [cefprozil] and Demerol   Review of Systems Review of Systems  Constitutional: Negative for appetite change and fatigue.  HENT: Negative for congestion, ear discharge and sinus pressure.   Eyes: Negative for discharge.  Respiratory: Negative for cough.   Cardiovascular: Negative for chest pain.  Gastrointestinal: Positive for abdominal pain and nausea. Negative for diarrhea.  Genitourinary: Negative for frequency and hematuria.  Musculoskeletal: Negative for back pain.  Skin: Negative for rash.  Neurological: Negative for seizures and headaches.  Psychiatric/Behavioral: Negative for hallucinations.     Physical Exam Updated Vital Signs BP (!) 145/90   Pulse 66   Temp 98.5 F (36.9 C) (Oral)   Resp 14   Ht 5\' 1"  (1.549 m)   Wt 93 kg   SpO2 (!) 89%   BMI 38.73 kg/m   Physical Exam  Constitutional: She is oriented to person, place, and time. She appears well-developed.  HENT:  Head: Normocephalic.  Eyes: Conjunctivae and EOM are normal. No scleral icterus.  Neck: Neck supple. No thyromegaly present.  Cardiovascular: Normal rate and regular rhythm. Exam reveals no gallop and no friction rub.  No murmur heard. Pulmonary/Chest: No stridor. She has no wheezes. She has no rales. She exhibits no tenderness.  Abdominal: She exhibits no distension.  There is tenderness. There is no rebound.  Minimal tenderness in lower abdomen  Musculoskeletal: Normal range of motion. She exhibits no edema.  Lymphadenopathy:    She has no cervical adenopathy.  Neurological: She is oriented to person, place, and time. She exhibits normal muscle tone. Coordination normal.  Skin: No rash noted. No erythema.  Psychiatric: She has a normal mood and affect. Her behavior is normal.     ED Treatments / Results  Labs (all labs ordered are listed, but only abnormal results are displayed) Labs Reviewed  BASIC METABOLIC PANEL - Abnormal; Notable for the following components:      Result Value   Glucose, Bld 115 (*)    All other components within normal limits  CBC - Abnormal; Notable for the following components:   RDW 16.0 (*)    All other components within normal limits  PROTIME-INR - Abnormal; Notable for the following components:   Prothrombin Time 33.1 (*)    All other components within normal limits  BRAIN NATRIURETIC PEPTIDE - Abnormal; Notable for the following components:   B Natriuretic Peptide 201.0 (*)    All other components within normal limits  TROPONIN I  HEPATIC FUNCTION PANEL    EKG EKG Interpretation  Date/Time:  Saturday April 25 2018 09:57:20 EDT Ventricular Rate:  69 PR Interval:    QRS Duration: 96 QT Interval:  403 QTC Calculation: 441 R Axis:   -2 Text Interpretation:  Sinus rhythm Low voltage, precordial leads Confirmed by Milton Ferguson [phone]) on 04/25/2018 10:44:51 AM   Radiology Dg Chest 2 View  Result Date: 04/25/2018 CLINICAL DATA:  Chest pain. EXAM: CHEST - 2 VIEW COMPARISON:  Chest x-ray dated August 12, 2017.  FINDINGS: Stable cardiomegaly. Normal pulmonary vascularity. No focal consolidation, pleural effusion, or pneumothorax. No acute osseous abnormality. Unchanged chronic upper and lower thoracic compression deformities. IMPRESSION: Stable cardiomegaly.  No active cardiopulmonary disease.  Electronically Signed   By: Titus Dubin M.D.   On: 04/25/2018 11:29   Ct Abdomen Pelvis W Contrast  Result Date: 04/24/2018 CLINICAL DATA:  Acute right-sided abdominal pain. EXAM: CT ABDOMEN AND PELVIS WITH CONTRAST TECHNIQUE: Multidetector CT imaging of the abdomen and pelvis was performed using the standard protocol following bolus administration of intravenous contrast. CONTRAST:  160mL ISOVUE-300 IOPAMIDOL (ISOVUE-300) INJECTION 61% COMPARISON:  CT scan of May 10, 2016. FINDINGS: Lower chest: No acute abnormality. Hepatobiliary: No focal liver abnormality is seen. Status post cholecystectomy. No biliary dilatation. Pancreas: Unremarkable. No pancreatic ductal dilatation or surrounding inflammatory changes. Spleen: Normal in size without focal abnormality. Adrenals/Urinary Tract: Adrenal glands appear normal. Stable bilateral renal cysts are noted. Nonobstructive left renal calculus is noted. No hydronephrosis or renal obstruction is noted. Urinary bladder is unremarkable. Stomach/Bowel: The stomach appears normal. Status post appendectomy. Focal diverticulitis of sigmoid colon is noted. No abscess formation is noted. Vascular/Lymphatic: Aortic atherosclerosis. No enlarged abdominal or pelvic lymph nodes. Reproductive: Status post hysterectomy. No adnexal masses. Other: No abdominal wall hernia or abnormality. No abdominopelvic ascites. Musculoskeletal: No acute or significant osseous findings. IMPRESSION: Focal sigmoid diverticulitis.  No abscess formation is noted. Nonobstructive left renal calculus. Aortic Atherosclerosis (ICD10-I70.0). Electronically Signed   By: Marijo Conception, M.D.   On: 04/24/2018 12:16    Procedures Procedures (including critical care time)  Medications Ordered in ED Medications  ondansetron (ZOFRAN) injection 4 mg (has no administration in time range)     Initial Impression / Assessment and Plan / ED Course  I have reviewed the triage vital signs and the  nursing notes.  Pertinent labs & imaging results that were available during my care of the patient were reviewed by me and considered in my medical decision making (see chart for details).     Patient nausea has improved with Zofran.  Labs are unremarkable.  Patient will continue taking her antibiotic for her diverticulitis and is given a prescription of Zofran will follow-up with her doctor this week  Final Clinical Impressions(s) / ED Diagnoses   Final diagnoses:  Nausea    ED Discharge Orders         Ordered    ondansetron (ZOFRAN ODT) 4 MG disintegrating tablet     04/25/18 1212           Milton Ferguson, MD 04/25/18 1218

## 2018-04-26 LAB — URINE CULTURE: Culture: 50000 — AB

## 2018-04-27 ENCOUNTER — Telehealth: Payer: Self-pay | Admitting: Emergency Medicine

## 2018-04-27 NOTE — Telephone Encounter (Signed)
Post ED Visit - Positive Culture Follow-up  Culture report reviewed by antimicrobial stewardship pharmacist:  []  Elenor Quinones, Pharm.D. []  Heide Guile, Pharm.D., BCPS AQ-ID []  Parks Neptune, Pharm.D., BCPS []  Alycia Rossetti, Pharm.D., BCPS []  [place], Pharm.D., BCPS, AAHIVP []  Legrand Como, Pharm.D., BCPS, AAHIVP []  Salome Arnt, PharmD, BCPS []  Johnnette Gourd, PharmD, BCPS [x]  Hughes Better, PharmD, BCPS []  Leeroy Cha, PharmD  Positive urine culture Treated with ciprofloxacin and netronidazole, organism sensitive to the same and no further patient follow-up is required at this time.  Hazle Nordmann 04/27/2018, 9:39 AM

## 2018-04-28 ENCOUNTER — Other Ambulatory Visit: Payer: Self-pay | Admitting: Cardiovascular Disease

## 2018-04-28 ENCOUNTER — Ambulatory Visit (INDEPENDENT_AMBULATORY_CARE_PROVIDER_SITE_OTHER): Payer: Medicare HMO | Admitting: Family Medicine

## 2018-04-28 ENCOUNTER — Other Ambulatory Visit: Payer: Self-pay | Admitting: Family Medicine

## 2018-04-28 VITALS — Ht 61.0 in | Wt 208.0 lb

## 2018-04-28 DIAGNOSIS — R69 Illness, unspecified: Secondary | ICD-10-CM | POA: Diagnosis not present

## 2018-04-28 DIAGNOSIS — E119 Type 2 diabetes mellitus without complications: Secondary | ICD-10-CM | POA: Diagnosis not present

## 2018-04-28 DIAGNOSIS — Z7901 Long term (current) use of anticoagulants: Secondary | ICD-10-CM | POA: Diagnosis not present

## 2018-04-28 DIAGNOSIS — Z23 Encounter for immunization: Secondary | ICD-10-CM

## 2018-04-28 DIAGNOSIS — I5033 Acute on chronic diastolic (congestive) heart failure: Secondary | ICD-10-CM | POA: Diagnosis not present

## 2018-04-28 DIAGNOSIS — J45901 Unspecified asthma with (acute) exacerbation: Secondary | ICD-10-CM

## 2018-04-28 LAB — POCT INR: INR: 3.7 — AB (ref 2.0–3.0)

## 2018-04-28 MED ORDER — BLOOD GLUCOSE MONITOR KIT: PACK | 0 refills | Status: DC

## 2018-04-28 NOTE — Telephone Encounter (Signed)
Ok six ref each

## 2018-04-28 NOTE — Progress Notes (Signed)
   Subjective:    Patient ID: [name], female    DOB: 12-31-1940, 77 y.o.   MRN: [id number]  HPI Patient is here today to follow up on her recent trip to the ed on Nov 2,2019. She states she started having palpitations then she felt nauseous. She state she called 911 and they took her to [facility].She states the did ekg and blood work.  Complete hospital record reviewed.  Emergency room note reviewed.  Along with blood work results.  Had transient nausea.  Etiology unclear.  Not felt to be cardiac.  Patient received treatment emergency room now overall is feeling better  Patient claims compliance with diabetes medication. No obvious side effects. Reports no substantial low sugar spells. Most numbers are generally in good range when checked fasting. Generally does not miss a dose of medication. Watching diabetic diet closely  Patient has ongoing symptoms associated with asthma.  See prior note.  Using albuterol couple times per day.  Claims compliance with her chronic medications.  Pt sen in the r, handling the meds well now  abd pain is now better  Review of Systems No headache, no major weight loss or weight gain, no chest pain no back pain abdominal pain no change in bowel habits complete ROS otherwise negative     Objective:   Physical Exam  Alert and oriented, vitals reviewed and stable, NAD ENT-TM's and ext canals WNL bilat via otoscopic exam Soft palate, tonsils and post pharynx WNL via oropharyngeal exam Neck-symmetric, no masses; thyroid nonpalpable and nontender Pulmonary-no tachypnea or accessory muscle use; Clear without wheezes via auscultation Card--no abnrml murmurs, rhythm reg and rate WNL Carotid pulses symmetric, without bruits       Assessment & Plan:  Impression status post ER visit for nausea etiology unclear.  Work-up results reviewed with patient now resolved  2.  Type 2 diabetes discussed sugars overall running decent medications reviewed  3.  Chronic  asthma ongoing.  Substantial at times.  Patient wishes to add no new chronic medications.  4.  Chronic anticoagulation INR results I reviewed discussed adjustments as appropriate  Vaccines discussed flu shot today  Follow-up regular appointment

## 2018-04-29 ENCOUNTER — Telehealth (INDEPENDENT_AMBULATORY_CARE_PROVIDER_SITE_OTHER): Payer: Self-pay | Admitting: Internal Medicine

## 2018-04-29 NOTE — Telephone Encounter (Signed)
Patient left message stating she is having issues with diarrhea - please call her at [phone]

## 2018-04-29 NOTE — Telephone Encounter (Signed)
She will come to [facility] tomorrow before I prescribe Lomotil

## 2018-04-30 ENCOUNTER — Ambulatory Visit (INDEPENDENT_AMBULATORY_CARE_PROVIDER_SITE_OTHER): Payer: Medicare HMO | Admitting: Internal Medicine

## 2018-04-30 ENCOUNTER — Encounter (INDEPENDENT_AMBULATORY_CARE_PROVIDER_SITE_OTHER): Payer: Self-pay | Admitting: Internal Medicine

## 2018-04-30 ENCOUNTER — Encounter (INDEPENDENT_AMBULATORY_CARE_PROVIDER_SITE_OTHER): Payer: Self-pay | Admitting: *Deleted

## 2018-04-30 VITALS — BP 140/78 | HR 76 | Temp 98.6°F | Ht 61.0 in | Wt 208.9 lb

## 2018-04-30 DIAGNOSIS — K5732 Diverticulitis of large intestine without perforation or abscess without bleeding: Secondary | ICD-10-CM | POA: Diagnosis not present

## 2018-04-30 DIAGNOSIS — R1319 Other dysphagia: Secondary | ICD-10-CM

## 2018-04-30 DIAGNOSIS — R1310 Dysphagia, unspecified: Secondary | ICD-10-CM

## 2018-04-30 DIAGNOSIS — R197 Diarrhea, unspecified: Secondary | ICD-10-CM

## 2018-04-30 MED ORDER — DIPHENOXYLATE-ATROPINE 2.5-0.025 MG PO TABS: ORAL_TABLET | ORAL | 0 refills | Status: DC

## 2018-04-30 NOTE — Progress Notes (Signed)
Subjective:    Patient ID: [name], female    DOB: 1941-05-16, 77 y.o.   MRN: [id number]  HPI Here today for f/u. Seen in the ED 04/24/2018 with diverticulitis. Hx of same. Underwent a CT which revealed Focal sigmoid diverticulitis.  No abscess formation is noted. Started on Cipro and Flagyl.  She is feeling better. She is 80% better.  She denies any fever. She is having loose stools.  She on on Lomotil for her loose stools.She does not have diarrhea on a daily basis.  She has maintained her weight since 2017.  She also c/o dysphagia today. Difficult time swallowing solid foods. Symptoms for about a year.  Her last EGD was in 2017 for epigastric pain, dysphaiga Impression: - Normal esophagus. - Z-line regular, 36 cm from the incisors. - 2 cm hiatal hernia. - No endoscopic esophageal abnormality to explain  patient's dysphagia. Esophagus dilated. Dilated. - Normal stomach. - Normal duodenal bulb and second portion of the  duodenum. - No specimens collected. - Comment: Nausea may be secondary t or  gastroparesis. If she remains with nauseashe may  consider dropping narcotic dose. Since no structural abnormality noted to esophagus  she possibly ha esophageal motility disorder. Review of Systems Past Medical History:  Diagnosis Date  . Allergy   . Allergy history unknown   . Arthritis   . ASCVD (arteriosclerotic cardiovascular disease)    stent to mid and proximal left anterior descending in 06/2002;drug eluting stent placed in the second diagnol in 08/2003 after  A  non-st elevation myocardial infarction   . Asthma   . Atrial  fibrillation ([place])   . Chronic anticoagulation   . Chronic nausea   . Chronic pain of left knee   . Coronary artery disease   . Current chronic use of systemic steroids 05/11/2016  . Depression   . Diabetes mellitus    no insulin  . Diarrhea    acute  . Diverticulitis of intestine   . FH: colonic polyps    adenomataous  . GERD (gastroesophageal reflux disease)   . History of kidney stones   . Hyperlipidemia    pulmonary embolism 2000 and 09/2008  . Hypertension   . Hypothyroidism   . Insomnia   . Irritable bowel syndrome   . Myocardial infarction ([place])   . Paroxysmal atrial fibrillation ([facility])    normal LV function; episodes occurred in 205 and 09/2007  . Pedal edema   . Peripheral edema   . PONV (postoperative nausea and vomiting)   . Pulmonary embolism ([place])    2000/09/2008  . Rectal bleeding   . Sleep apnea   . Thyroid disease    hypothyroidism  . Tobacco user    stopped   . Venous stasis     Past Surgical History:  Procedure Laterality Date  . ABDOMINAL HYSTERECTOMY    . APPENDECTOMY    . BALLOON DILATION N/A 11/11/2013   Procedure: BALLOON DILATION;  Surgeon: Rogene Houston, MD;  [facility];  Service: Endoscopy;  Laterality: N/A;  . BREAST REDUCTION SURGERY    . BREAST SURGERY    . CHOLECYSTECTOMY    . CLEFT PALATE REPAIR     4 surgeries  . COLONOSCOPY  2011   negative  . COLONOSCOPY N/A 05/10/2014   Procedure: COLONOSCOPY;  Surgeon: Rogene Houston, MD;  [facility];  Service: Endoscopy;  Laterality: N/A;  . ESOPHAGEAL DILATION N/A 03/08/2016   Procedure: ESOPHAGEAL DILATION;  Surgeon: Rogene Houston, MD;  [facility];  Service: Endoscopy;  Laterality: N/A;  . ESOPHAGOGASTRODUODENOSCOPY N/A 11/11/2013   Procedure: ESOPHAGOGASTRODUODENOSCOPY (EGD);  Surgeon: Rogene Houston, MD;  [facility];  Service: Endoscopy;  Laterality: N/A;  200-moved to 100 Ann notified pt  . ESOPHAGOGASTRODUODENOSCOPY N/A 05/09/2014    Procedure: ESOPHAGOGASTRODUODENOSCOPY (EGD);  Surgeon: Rogene Houston, MD;  [facility];  Service: Endoscopy;  Laterality: N/A;  . ESOPHAGOGASTRODUODENOSCOPY N/A 03/08/2016   Procedure: ESOPHAGOGASTRODUODENOSCOPY (EGD);  Surgeon: Rogene Houston, MD;  [facility];  Service: Endoscopy;  Laterality: N/A;  2:20  . HEEL SPUR SURGERY    . KNEE SURGERY    . MALONEY DILATION N/A 11/11/2013   Procedure: Venia Minks DILATION;  Surgeon: Rogene Houston, MD;  [facility];  Service: Endoscopy;  Laterality: N/A;  . OOPHORECTOMY    . SAVORY DILATION N/A 11/11/2013   Procedure: SAVORY DILATION;  Surgeon: Rogene Houston, MD;  [facility];  Service: Endoscopy;  Laterality: N/A;  . TONSILLECTOMY      Allergies  Allergen Reactions  . Cefzil [Cefprozil] Nausea And Vomiting  . Demerol Nausea And Vomiting    Current Outpatient Medications on File Prior to Visit  Medication Sig Dispense Refill  . acetaminophen (TYLENOL) 500 MG tablet Take 500 mg by mouth daily as needed for headache.    . albuterol (PROVENTIL) (2.5 MG/3ML) 0.083% nebulizer solution Take 3 mLs (2.5 mg total) by nebulization every 6 (six) hours as needed for wheezing or shortness of breath. 75 mL 5  . albuterol (VENTOLIN HFA) 108 (90 Base) MCG/ACT inhaler INHALE 2 PUFFS INTO THE LUNGS 4 TIMES DAILY AS NEEDED FOR WHEEZING. 18 g 5  . albuterol (VENTOLIN HFA) 108 (90 Base) MCG/ACT inhaler INHALE 2 PUFFS INTO THE LUNGS 4 TIMES DAILY AS NEEDED FOR WHEEZING. 18 g 5  . atorvastatin (LIPITOR) 40 MG tablet TAKE ONE TABLET BY MOUTH DAILY. 90 tablet 3  . blood glucose meter kit and supplies KIT Dispense based on patient and insurance preference. Test once a day. Dx: E11.9 1 each 0  . Cholecalciferol (VITAMIN D3) 1000 units CAPS Take 1 capsule every evening by mouth.     . ciprofloxacin (CIPRO) 500 MG tablet Take 1 tablet (500 mg total) by mouth every 12 (twelve) hours. 20 tablet 0  . diltiazem (CARDIZEM CD) 180 MG  24 hr capsule TAKE ONE CAPSULE BY MOUTH DAILY. 30 capsule 11  . diphenoxylate-atropine (LOMOTIL) 2.5-0.025 MG tablet TAKE ONE TABLET BY MOUTH 2 TIMES A DAY AS NEEDED. 60 tablet 0  . escitalopram (LEXAPRO) 20 MG tablet TAKE ONE TABLET BY MOUTH ONCE DAILY. 30 tablet 0  . fluticasone (FLOVENT HFA) 110 MCG/ACT inhaler Inhale 2 puffs into the lungs 2 (two) times daily. 12 g 5  . furosemide (LASIX) 40 MG tablet Take 1 tablet (40 mg total) by mouth daily. 90 tablet 3  . levothyroxine (SYNTHROID, LEVOTHROID) 75 MCG tablet Take 1 tablet (75 mcg total) by mouth daily. 90 tablet 1  . losartan (COZAAR) 25 MG tablet TAKE 1 TABLET BY MOUTH ONCE DAILY. 90 tablet 3  . meclizine (ANTIVERT) 25 MG tablet Take 1 tablet (25 mg total) by mouth 3 (three) times daily as needed for dizziness. 30 tablet 0  . metroNIDAZOLE (FLAGYL) 500 MG tablet Take 1 tablet (500 mg total) by mouth 3 (three) times daily. 30 tablet 0  . nitroGLYCERIN (NITROLINGUAL) 0.4 MG/SPRAY spray Place 1 spray under  the tongue every 5 (five) minutes x 3 doses as needed for chest pain. 12 g 3  . ONE TOUCH ULTRA TEST test strip USE TO TEST BLOOD SUGAR ONCE DAILY. 50 each 0  . Oxycodone HCl 10 MG TABS Take 1 tablet (10 mg total) by mouth 2 (two) times daily as needed (FOR PAIN). 60 tablet 0  . pantoprazole (PROTONIX) 40 MG tablet Take 1 tablet (40 mg total) by mouth 2 (two) times daily before a meal. 60 tablet 3  . Potassium Chloride ER 20 MEQ TBCR One po daily 90 tablet 1  . warfarin (COUMADIN) 5 MG tablet TAKE ONE TABLET BY MOUTH ONCE DAILY. TAKE AS DIRECTED BY DOCTOR. 30 tablet 0  . [DISCONTINUED] ferrous sulfate 325 (65 FE) MG tablet Take 1 tablet (325 mg total) by mouth daily with breakfast. IRON SUPPLEMENT.    . [DISCONTINUED] ipratropium (ATROVENT) 0.02 % nebulizer solution Take 500 mcg by nebulization every 4 (four) hours as needed. For shortness of breath     No current facility-administered medications on file prior to visit.           Objective:   Physical Exam Blood pressure 140/78, pulse 76, temperature 98.6 F (37 C), height 5' 1"  (1.549 m), weight 208 lb 14.4 oz (94.8 kg). Alert and oriented. Skin warm and dry. Oral mucosa is moist.   . Sclera anicteric, conjunctivae is pink. Thyroid not enlarged. No cervical lymphadenopathy. Lungs clear. Heart regular rate and rhythm.  Abdomen is soft. Bowel sounds are positive. No hepatomegaly. No abdominal masses felt. No tenderness.  No edema to lower extremities.           Assessment & Plan:  Diverticulitis. She is 80% better. Diarrhea off an on (chronic) Rx for Lomotil. Dysphagia: DG esophagram.

## 2018-04-30 NOTE — Patient Instructions (Signed)
Rx for Lomotil to her pharmacy. DG esophagram.

## 2018-05-01 ENCOUNTER — Telehealth: Payer: Self-pay | Admitting: Family Medicine

## 2018-05-01 NOTE — Telephone Encounter (Signed)
Please advise. Thank you

## 2018-05-01 NOTE — Telephone Encounter (Signed)
Patient states she has appointment on 05/04/18 with Dr. Laural Golden to run a scope down her throat. She wants to know if she should stop her coumadin for surgery,she needs to know by 11 today.

## 2018-05-04 ENCOUNTER — Ambulatory Visit (HOSPITAL_COMMUNITY)
Admission: RE | Admit: 2018-05-04 | Discharge: 2018-05-04 | Disposition: A | Discharge status: Home / Self Care | Payer: Medicare HMO | Source: Ambulatory Visit | Attending: Internal Medicine | Admitting: Internal Medicine

## 2018-05-04 DIAGNOSIS — K449 Diaphragmatic hernia without obstruction or gangrene: Secondary | ICD-10-CM | POA: Insufficient documentation

## 2018-05-04 DIAGNOSIS — R1310 Dysphagia, unspecified: Secondary | ICD-10-CM | POA: Diagnosis not present

## 2018-05-04 DIAGNOSIS — R1319 Other dysphagia: Secondary | ICD-10-CM

## 2018-05-05 ENCOUNTER — Ambulatory Visit: Payer: Medicare HMO

## 2018-05-11 ENCOUNTER — Telehealth: Payer: Self-pay | Admitting: Family Medicine

## 2018-05-11 MED ORDER — PROMETHAZINE HCL 25 MG PO TABS: ORAL_TABLET | ORAL | 0 refills | Status: DC

## 2018-05-11 NOTE — Telephone Encounter (Signed)
Patient states she has been having experiencing nausea since yesterday evening, no abd pain, no fever, no appetite, pt requesting nausea medication. Advise.    [place], [place]

## 2018-05-11 NOTE — Telephone Encounter (Signed)
Med sent to pharm. Pt notified.  

## 2018-05-11 NOTE — Telephone Encounter (Signed)
Nausea started yesterday evening, no vomiting, no fever, no abdominal pain. Pt wanting phenergan tablets because zofran doesn't last long for her. Pt states she cuts them in half. She took her last one last night and it did help.   [facility]

## 2018-05-11 NOTE — Telephone Encounter (Signed)
Phenergan 25 mg, #14, half tablet twice daily as needed nausea, caution drowsiness

## 2018-05-12 ENCOUNTER — Ambulatory Visit: Payer: Medicare HMO

## 2018-05-14 ENCOUNTER — Ambulatory Visit (INDEPENDENT_AMBULATORY_CARE_PROVIDER_SITE_OTHER): Payer: Medicare HMO

## 2018-05-14 ENCOUNTER — Ambulatory Visit: Payer: Medicare HMO | Admitting: Family Medicine

## 2018-05-14 DIAGNOSIS — Z7901 Long term (current) use of anticoagulants: Secondary | ICD-10-CM | POA: Diagnosis not present

## 2018-05-14 LAB — POCT INR: INR: 4 — AB (ref 2.0–3.0)

## 2018-05-14 NOTE — Patient Instructions (Signed)
Skip today only 05/14/2018. Then take one tablet all days except on Tuesday and Saturday take 1/2 tablet. Return in 2 weeks for nurse visit.

## 2018-05-26 ENCOUNTER — Ambulatory Visit (INDEPENDENT_AMBULATORY_CARE_PROVIDER_SITE_OTHER): Payer: Medicare HMO

## 2018-05-26 DIAGNOSIS — Z7901 Long term (current) use of anticoagulants: Secondary | ICD-10-CM | POA: Diagnosis not present

## 2018-05-26 LAB — POCT INR: INR: 2.6 (ref 2.0–3.0)

## 2018-05-26 NOTE — Patient Instructions (Signed)
Take one 5 mg per day every day. Nurse visit recheck in 3 weeks.

## 2018-05-27 ENCOUNTER — Ambulatory Visit: Payer: Medicare HMO

## 2018-05-30 ENCOUNTER — Other Ambulatory Visit (INDEPENDENT_AMBULATORY_CARE_PROVIDER_SITE_OTHER): Payer: Self-pay | Admitting: Internal Medicine

## 2018-05-30 DIAGNOSIS — R197 Diarrhea, unspecified: Secondary | ICD-10-CM

## 2018-06-04 ENCOUNTER — Telehealth (INDEPENDENT_AMBULATORY_CARE_PROVIDER_SITE_OTHER): Payer: Self-pay | Admitting: Internal Medicine

## 2018-06-04 MED ORDER — DIPHENOXYLATE-ATROPINE 2.5-0.025 MG PO TABS: ORAL_TABLET | ORAL | 0 refills | Status: DC

## 2018-06-04 NOTE — Telephone Encounter (Signed)
Rx for Lomotil to front desk.

## 2018-06-04 NOTE — Telephone Encounter (Signed)
err

## 2018-06-11 ENCOUNTER — Telehealth: Payer: Self-pay | Admitting: Family Medicine

## 2018-06-11 MED ORDER — OXYCODONE HCL 10 MG PO TABS: 10.0000 mg | ORAL_TABLET | Freq: Two times a day (BID) | ORAL | 0 refills | Status: DC | PRN

## 2018-06-11 NOTE — Telephone Encounter (Signed)
ok 

## 2018-06-11 NOTE — Telephone Encounter (Signed)
Pt requesting refill for Oxycodone HCl 10 MG TABS  Pt has a 2 month follow up appt 06/29/2018 with Dr.Steve.  Pharmacy:  [facility], [place]  Pt wants medication delivered

## 2018-06-12 ENCOUNTER — Other Ambulatory Visit: Payer: Self-pay | Admitting: *Deleted

## 2018-06-12 MED ORDER — OXYCODONE HCL 10 MG PO TABS: 10.0000 mg | ORAL_TABLET | Freq: Two times a day (BID) | ORAL | 0 refills | Status: DC | PRN

## 2018-06-12 NOTE — Telephone Encounter (Signed)
Script signed. [facility] and they will send a delivery driver to pick up medication and when it is filled they will deliver.

## 2018-06-12 NOTE — Telephone Encounter (Signed)
Script printed await signature from dr Richardson Landry

## 2018-06-22 ENCOUNTER — Other Ambulatory Visit: Payer: Self-pay | Admitting: Cardiovascular Disease

## 2018-06-23 ENCOUNTER — Ambulatory Visit: Payer: Medicare HMO

## 2018-06-29 ENCOUNTER — Ambulatory Visit: Payer: Medicare HMO | Admitting: Family Medicine

## 2018-06-29 ENCOUNTER — Ambulatory Visit (INDEPENDENT_AMBULATORY_CARE_PROVIDER_SITE_OTHER): Payer: Medicare HMO | Admitting: Family Medicine

## 2018-06-29 ENCOUNTER — Encounter: Payer: Self-pay | Admitting: Family Medicine

## 2018-06-29 VITALS — BP 132/86 | Wt 204.8 lb

## 2018-06-29 DIAGNOSIS — E119 Type 2 diabetes mellitus without complications: Secondary | ICD-10-CM | POA: Diagnosis not present

## 2018-06-29 DIAGNOSIS — E039 Hypothyroidism, unspecified: Secondary | ICD-10-CM | POA: Diagnosis not present

## 2018-06-29 DIAGNOSIS — J45901 Unspecified asthma with (acute) exacerbation: Secondary | ICD-10-CM | POA: Diagnosis not present

## 2018-06-29 DIAGNOSIS — R490 Dysphonia: Secondary | ICD-10-CM | POA: Diagnosis not present

## 2018-06-29 DIAGNOSIS — Z7901 Long term (current) use of anticoagulants: Secondary | ICD-10-CM | POA: Diagnosis not present

## 2018-06-29 LAB — POCT INR
INR: 4.2 — AB (ref 2.0–3.0)
INR: 4.2 — AB (ref 2.0–3.0)

## 2018-06-29 LAB — POCT GLYCOSYLATED HEMOGLOBIN (HGB A1C): HEMOGLOBIN A1C: 5.2 % (ref 4.0–5.6)

## 2018-06-29 MED ORDER — LEVOTHYROXINE SODIUM 75 MCG PO TABS: 75.0000 ug | ORAL_TABLET | Freq: Every day | ORAL | 1 refills | Status: DC

## 2018-06-29 MED ORDER — WARFARIN SODIUM 5 MG PO TABS: ORAL_TABLET | ORAL | 0 refills | Status: DC

## 2018-06-29 NOTE — Progress Notes (Signed)
   Subjective:    Patient ID: [name], female    DOB: Jan 04, 1941, 78 y.o.   MRN: [id number] Patient arrives with numerous concerns Diabetes  She presents for her follow-up diabetic visit. She has type 2 diabetes mellitus. There are no hypoglycemic associated symptoms. There are no diabetic associated symptoms. There are no hypoglycemic complications. There are no diabetic complications. She does not see a podiatrist.Eye exam is current.   Pt here for 2 month checkup.   Results for orders placed or performed in visit on 06/29/18  POCT HgB A1C  Result Value Ref Range   Hemoglobin A1C 5.2 4.0 - 5.6 %   HbA1c POC (<> result, manual entry)     HbA1c, POC (prediabetic range)     HbA1c, POC (controlled diabetic range)    POCT INR  Result Value Ref Range   INR 4.2 (A) 2.0 - 3.0   Patient claims compliance with diabetes medication. No obvious side effects. Reports no substantial low sugar spells. Most numbers are generally in good range when checked fasting. Generally does not miss a dose of medication. Watching diabetic diet closely  Asthma.  Clinically stable.  Using steroid inhaler faithfully.  Rare use of albuterol.  Generally good control.  Claims compliance with her Coumadin.  See results she no bleeding  Blood pressure medicine and blood pressure levels reviewed today with patient. Compliant with blood pressure medicine. States does not miss a dose. No obvious side effects. Blood pressure generally good when checked elsewhere. Watching salt intake.     .  Review of Systems No headache, no major weight loss or weight gain, no chest pain no back pain abdominal pain no change in bowel habits complete ROS otherwise negative     Objective:   Physical Exam Alert and oriented, vitals reviewed and stable, NAD ENT-TM's and ext canals WNL bilat via otoscopic exam Soft palate, tonsils and post pharynx WNL via oropharyngeal exam Neck-symmetric, no masses; thyroid nonpalpable and  nontender Pulmonary-no tachypnea or accessory muscle use; Clear without wheezes via auscultation Card--no abnrml murmurs, rhythm reg and rate WNL Carotid pulses symmetric, without bruits        Assessment & Plan:  Impression 1 type 2 diabetes.  Tight control discussed no hypoglycemic episodes reported maintain same approach  2.  Hypertension good control discussed maintain same  3.  Asthma.  Clinically stable discussed maintain same  #4 hoarseness.  2 months duration.  Patient is worried about this.  I advised her to take Claritin.  She would like to see an ENT  Anticoagulation.  INR too high.  Hold 1 dose.  Backoff is noted.  Follow-up in a couple weeks.  Follow-up office visit in 4 months

## 2018-06-29 NOTE — Patient Instructions (Addendum)
Once per day claritin at bedtime; Stop coumadin today. Take one tablet every day except Thursday. On Thursday take 1/2 tablet. Recheck in 2 weeks

## 2018-07-06 ENCOUNTER — Encounter: Payer: Self-pay | Admitting: Family Medicine

## 2018-07-08 ENCOUNTER — Other Ambulatory Visit (INDEPENDENT_AMBULATORY_CARE_PROVIDER_SITE_OTHER): Payer: Self-pay | Admitting: Internal Medicine

## 2018-07-10 ENCOUNTER — Telehealth: Payer: Self-pay | Admitting: Family Medicine

## 2018-07-10 NOTE — Telephone Encounter (Signed)
Pt has received 2 calls stating Dr. Richardson Landry has ordered a back and shoulder brace and are wanting her medicare card number. She is wanting to verify if that is true or not because she doesn't need a back brace or shoulder brace. She thinks this is a Pension scheme manager.

## 2018-07-10 NOTE — Telephone Encounter (Signed)
Contacted [employer]. [employer] states they have no order on file for back or shoulder brace. Contacted patient. Pt states that she received a call last night. She called the number that showed on caller ID. The number was house phone. Pt states that when she received this calls, they had all her info and said that Dr. Wolfgang Phoenix had written an order for her. Informed patient to not give any information out unless she know who the person is. Pt verbalized understanding.

## 2018-07-12 NOTE — Telephone Encounter (Signed)
Correct response

## 2018-07-14 ENCOUNTER — Ambulatory Visit: Payer: Medicare HMO | Admitting: Family Medicine

## 2018-07-15 ENCOUNTER — Telehealth: Payer: Self-pay | Admitting: Family Medicine

## 2018-07-15 MED ORDER — OXYCODONE HCL 10 MG PO TABS: 10.0000 mg | ORAL_TABLET | Freq: Two times a day (BID) | ORAL | 0 refills | Status: DC | PRN

## 2018-07-15 NOTE — Telephone Encounter (Signed)
Requesting refill for Oxycodone HCl 10 MG TABS  Pharmacy:  [facility], [place]

## 2018-07-15 NOTE — Telephone Encounter (Signed)
Ok times one if time 

## 2018-07-20 ENCOUNTER — Other Ambulatory Visit: Payer: Self-pay | Admitting: Cardiovascular Disease

## 2018-07-20 ENCOUNTER — Other Ambulatory Visit: Payer: Self-pay | Admitting: Family Medicine

## 2018-07-22 ENCOUNTER — Ambulatory Visit: Payer: Medicare HMO | Admitting: Family Medicine

## 2018-07-23 ENCOUNTER — Telehealth: Payer: Self-pay | Admitting: Family Medicine

## 2018-07-23 NOTE — Telephone Encounter (Signed)
Patient states she wants to go to [facility] or what ever the new name for it is.

## 2018-07-23 NOTE — Telephone Encounter (Signed)
Is pt seeking n h or rest home or?

## 2018-07-23 NOTE — Telephone Encounter (Signed)
thx

## 2018-07-23 NOTE — Telephone Encounter (Signed)
Long Term Care form placed in blue folder in Dr.Steve's box.

## 2018-07-27 ENCOUNTER — Ambulatory Visit (INDEPENDENT_AMBULATORY_CARE_PROVIDER_SITE_OTHER): Payer: Medicare HMO | Admitting: Family Medicine

## 2018-07-27 ENCOUNTER — Encounter: Payer: Self-pay | Admitting: Family Medicine

## 2018-07-27 VITALS — BP 136/82 | Wt 208.4 lb

## 2018-07-27 DIAGNOSIS — I5033 Acute on chronic diastolic (congestive) heart failure: Secondary | ICD-10-CM

## 2018-07-27 DIAGNOSIS — Z7901 Long term (current) use of anticoagulants: Secondary | ICD-10-CM | POA: Diagnosis not present

## 2018-07-27 DIAGNOSIS — R262 Difficulty in walking, not elsewhere classified: Secondary | ICD-10-CM | POA: Diagnosis not present

## 2018-07-27 DIAGNOSIS — E039 Hypothyroidism, unspecified: Secondary | ICD-10-CM | POA: Diagnosis not present

## 2018-07-27 DIAGNOSIS — E119 Type 2 diabetes mellitus without complications: Secondary | ICD-10-CM | POA: Diagnosis not present

## 2018-07-27 DIAGNOSIS — J45901 Unspecified asthma with (acute) exacerbation: Secondary | ICD-10-CM

## 2018-07-27 LAB — POCT INR: INR: 4.2 — AB (ref 2.0–3.0)

## 2018-07-27 MED ORDER — TRIAMCINOLONE ACETONIDE 0.1 % EX CREA: TOPICAL_CREAM | CUTANEOUS | 0 refills | Status: DC

## 2018-07-27 NOTE — Patient Instructions (Signed)
Take one tablet on Monday, Wednesday, Friday and Saturday. Take one half tablet on Sunday, Tuesday and Thursday. Recheck INR in 2 weeks.

## 2018-07-27 NOTE — Progress Notes (Signed)
   Subjective:    Patient ID: [name], female    DOB: 04/14/1941, 78 y.o.   MRN: [id number]  HPI Pt here today for 2 week recheck. Pt was here on Jan 6 for long term anticoagulant therapy. 5 mg Coumadin, half tablet on thursdays.   Pt has a rash on left side of chest. Pt states it has been there about 3 weeks. Pt states the rash itches and is spreading.    Ongoing challenges with congestive heart failure.  Shortness of breath with exertion.  History of known coronary artery disease.  History of pulmonary emboli.  On chronic anticoagulation.  Progressive difficulty with gait.  Uses a walker now.  Unsteady when moving about always needs the walker.  Is reached a point where she is unable to drive.  Results for orders placed or performed in visit on 07/27/18  POCT INR  Result Value Ref Range   INR 4.2 (A) 2.0 - 3.0    Patient notes progressive weakness.  Having difficulty getting around.  Using a walker.  She has no family members who can help her.  Patient has reached a point where she feels she needs day and nighttime support.  She does not want a rest home.  She would like to be in a nursing home setting.  She needs daily assistance in bathing.  She is having difficulty cooking.  Having difficulty ambulating.  Getting confused as far as her medications go./States the bubble wrap has helped considerably Review of Systems No headache, no major weight loss or weight gain, no chest pain no back pain abdominal pain no change in bowel habits complete ROS otherwise negative     Objective:   Physical Exam Patient is obese.  Vitals stable.  Alert.  No major distress.  Chronic Mallett's evident.  Occasional cough during exam lungs clear currently although cough sounds somewhat wheezy.  No tachypnea heart regular rate and rhythm.  Ankles trace edema.  Walker present.       Assessment & Plan:  Impression 1 chronic diastolic congestive heart failure.  Clinically stable  2.   Chronic asthma.  Fair control oral  3.  Hypothyroidism.  4.  Type 2 diabetes.  See prior note for  5.  Chronic anticoagulation.  History of pulmonary embolus.  Also paroxysmal atrial fibrillation.  6.  Coronary artery disease.  Hyperlipidemia.  Medications all discussed and clarified.  We will work on filling out form for potential nursing home placement.  Patient has reached the point she feels she needs to be in nursing home.  She has been assured by someone at the local nursing center that she will qualify for this.  Needing assistance with activities of daily living.  Greater than 50% of this 40 minute face to face visit was spent in counseling and discussion and coordination of care regarding the above diagnosis/diagnosies   We will schedule follow-up in case patient's plans to enter nursing home are not fulfilled for some reason.  INR adjusted Coumadin adjusted

## 2018-07-31 ENCOUNTER — Other Ambulatory Visit: Payer: Self-pay | Admitting: Family Medicine

## 2018-08-05 ENCOUNTER — Telehealth: Payer: Self-pay | Admitting: Family Medicine

## 2018-08-05 NOTE — Telephone Encounter (Signed)
checking on status of form

## 2018-08-05 NOTE — Telephone Encounter (Signed)
Per Dr.Steve still working on it should be ready tomorrow

## 2018-08-06 NOTE — Telephone Encounter (Signed)
Form completed and ready for pick up  

## 2018-08-07 ENCOUNTER — Other Ambulatory Visit (INDEPENDENT_AMBULATORY_CARE_PROVIDER_SITE_OTHER): Payer: Self-pay | Admitting: Internal Medicine

## 2018-08-10 ENCOUNTER — Ambulatory Visit (INDEPENDENT_AMBULATORY_CARE_PROVIDER_SITE_OTHER): Payer: Medicare HMO | Admitting: Family Medicine

## 2018-08-10 ENCOUNTER — Encounter: Payer: Self-pay | Admitting: Family Medicine

## 2018-08-10 VITALS — BP 110/72 | Temp 98.4°F | Ht 61.0 in | Wt 200.6 lb

## 2018-08-10 DIAGNOSIS — E119 Type 2 diabetes mellitus without complications: Secondary | ICD-10-CM

## 2018-08-10 DIAGNOSIS — Z7901 Long term (current) use of anticoagulants: Secondary | ICD-10-CM | POA: Diagnosis not present

## 2018-08-10 DIAGNOSIS — J45901 Unspecified asthma with (acute) exacerbation: Secondary | ICD-10-CM | POA: Diagnosis not present

## 2018-08-10 DIAGNOSIS — E039 Hypothyroidism, unspecified: Secondary | ICD-10-CM

## 2018-08-10 LAB — POCT INR: INR: 2.6 (ref 2.0–3.0)

## 2018-08-10 MED ORDER — NEOMYCIN-POLYMYXIN-HC 3.5-10000-1 OT SOLN: 4.0000 [drp] | Freq: Four times a day (QID) | OTIC | 1 refills | Status: DC

## 2018-08-10 NOTE — Progress Notes (Signed)
   Subjective:    Patient ID: [name], female    DOB: 03-02-1941, 78 y.o.   MRN: [id number]  HPI pt arrives for a follow up. Working on going to nursing home.   Sore throat and left ear pain. Started yesterday. Ha had pain and discomort,.has used drops.  Some mild sore throat.  No fever no chills  Results for orders placed or performed in visit on 08/10/18  POCT INR  Result Value Ref Range   INR 2.6 2.0 - 3.0   Blood pressure medicine and blood pressure levels reviewed today with patient. Compliant with blood pressure medicine. States does not miss a dose. No obvious side effects. Blood pressure generally good when checked elsewhere. Watching salt intake.   Patient claims compliance with diabetes medication. No obvious side effects. Reports no substantial low sugar spells. Most numbers are generally in good range when checked fasting. Generally does not miss a dose of medication. Watching diabetic diet closely  Asthma overall stable.  Rare use of rescue inhaler.  Compliant with anticoagulant medicines at this time.  Review of Systems No headache, no major weight loss or weight gain, no chest pain no back pain abdominal pain no change in bowel habits complete ROS otherwise negative     Objective:   Physical Exam  Alert and oriented, vitals reviewed and stable, NAD ENT-TM's and ext canals WNL bilat via otoscopic exam Soft palate, tonsils and post pharynx WNL via oropharyngeal exam Neck-symmetric, no masses; thyroid nonpalpable and nontender Pulmonary-no tachypnea or accessory muscle use; Clear without wheezes via auscultation Card--no abnrml murmurs, rhythm reg and rate WNL Carotid pulses symmetric, without bruits       Assessment & Plan:  Impression 1 chronic anticoagulation back to goal discussed with the patient Coumadin dose.  2.  Type 2 diabetes.  Control good discussed diet discussed  3.  Hypertension clinically stable/compliant discussed  4.  Asthma  clinically stable continue same  5.  Diminished mobility patient now utilizing walker.  Also in need of assistance with bathing and cooking  Follow-up with monthly INR.  Recheck in 3 months diet exercise discussed.  Of note form filled out admission to nursing home

## 2018-08-12 ENCOUNTER — Other Ambulatory Visit: Payer: Self-pay | Admitting: Family Medicine

## 2018-08-12 ENCOUNTER — Telehealth: Payer: Self-pay | Admitting: Family Medicine

## 2018-08-12 MED ORDER — AMOXICILLIN 500 MG PO CAPS: ORAL_CAPSULE | ORAL | 0 refills | Status: DC

## 2018-08-12 NOTE — Telephone Encounter (Signed)
amox 500 tid ten d 

## 2018-08-12 NOTE — Telephone Encounter (Signed)
Medication sent in; called pharmacy to ask them to deliver. Pt verbalized understanding.

## 2018-08-12 NOTE — Telephone Encounter (Signed)
Pt is having drainage and a very sore throat. She is hoping to have something called in to [facility], [place] was seen in the office Monday and she states this all started yesterday.

## 2018-08-12 NOTE — Telephone Encounter (Signed)
Patient states se mentioned sore throat Monday but started with congestion yesterday and would like something called in for her

## 2018-08-13 ENCOUNTER — Emergency Department (HOSPITAL_COMMUNITY): Payer: Medicare HMO

## 2018-08-13 ENCOUNTER — Encounter (HOSPITAL_COMMUNITY): Payer: Self-pay | Admitting: Emergency Medicine

## 2018-08-13 ENCOUNTER — Telehealth: Payer: Self-pay | Admitting: *Deleted

## 2018-08-13 ENCOUNTER — Inpatient Hospital Stay (HOSPITAL_COMMUNITY)
Admission: EM | Admit: 2018-08-13 | Discharge: 2018-08-18 | DRG: 153 | Disposition: A | Discharge status: Skilled Nursing Facility | Payer: Medicare HMO | Attending: Internal Medicine | Admitting: Internal Medicine

## 2018-08-13 ENCOUNTER — Other Ambulatory Visit: Payer: Self-pay

## 2018-08-13 DIAGNOSIS — Z8371 Family history of colonic polyps: Secondary | ICD-10-CM

## 2018-08-13 DIAGNOSIS — Z7951 Long term (current) use of inhaled steroids: Secondary | ICD-10-CM

## 2018-08-13 DIAGNOSIS — I10 Essential (primary) hypertension: Secondary | ICD-10-CM | POA: Diagnosis present

## 2018-08-13 DIAGNOSIS — E039 Hypothyroidism, unspecified: Secondary | ICD-10-CM | POA: Diagnosis present

## 2018-08-13 DIAGNOSIS — E0822 Diabetes mellitus due to underlying condition with diabetic chronic kidney disease: Secondary | ICD-10-CM | POA: Diagnosis not present

## 2018-08-13 DIAGNOSIS — R05 Cough: Secondary | ICD-10-CM | POA: Diagnosis not present

## 2018-08-13 DIAGNOSIS — J36 Peritonsillar abscess: Secondary | ICD-10-CM | POA: Diagnosis not present

## 2018-08-13 DIAGNOSIS — Z79899 Other long term (current) drug therapy: Secondary | ICD-10-CM

## 2018-08-13 DIAGNOSIS — R791 Abnormal coagulation profile: Secondary | ICD-10-CM | POA: Diagnosis not present

## 2018-08-13 DIAGNOSIS — R509 Fever, unspecified: Secondary | ICD-10-CM | POA: Diagnosis not present

## 2018-08-13 DIAGNOSIS — Z833 Family history of diabetes mellitus: Secondary | ICD-10-CM

## 2018-08-13 DIAGNOSIS — J45909 Unspecified asthma, uncomplicated: Secondary | ICD-10-CM | POA: Diagnosis present

## 2018-08-13 DIAGNOSIS — K219 Gastro-esophageal reflux disease without esophagitis: Secondary | ICD-10-CM | POA: Diagnosis present

## 2018-08-13 DIAGNOSIS — E119 Type 2 diabetes mellitus without complications: Secondary | ICD-10-CM | POA: Diagnosis not present

## 2018-08-13 DIAGNOSIS — E785 Hyperlipidemia, unspecified: Secondary | ICD-10-CM | POA: Diagnosis not present

## 2018-08-13 DIAGNOSIS — I2510 Atherosclerotic heart disease of native coronary artery without angina pectoris: Secondary | ICD-10-CM | POA: Diagnosis not present

## 2018-08-13 DIAGNOSIS — E1169 Type 2 diabetes mellitus with other specified complication: Secondary | ICD-10-CM

## 2018-08-13 DIAGNOSIS — I480 Paroxysmal atrial fibrillation: Secondary | ICD-10-CM | POA: Diagnosis not present

## 2018-08-13 DIAGNOSIS — I252 Old myocardial infarction: Secondary | ICD-10-CM

## 2018-08-13 DIAGNOSIS — Z7401 Bed confinement status: Secondary | ICD-10-CM | POA: Diagnosis not present

## 2018-08-13 DIAGNOSIS — E038 Other specified hypothyroidism: Secondary | ICD-10-CM | POA: Diagnosis not present

## 2018-08-13 DIAGNOSIS — H9202 Otalgia, left ear: Secondary | ICD-10-CM | POA: Diagnosis not present

## 2018-08-13 DIAGNOSIS — Z79890 Hormone replacement therapy (postmenopausal): Secondary | ICD-10-CM

## 2018-08-13 DIAGNOSIS — Z801 Family history of malignant neoplasm of trachea, bronchus and lung: Secondary | ICD-10-CM | POA: Diagnosis not present

## 2018-08-13 DIAGNOSIS — Z87891 Personal history of nicotine dependence: Secondary | ICD-10-CM | POA: Diagnosis not present

## 2018-08-13 DIAGNOSIS — F329 Major depressive disorder, single episode, unspecified: Secondary | ICD-10-CM | POA: Diagnosis present

## 2018-08-13 DIAGNOSIS — Z807 Family history of other malignant neoplasms of lymphoid, hematopoietic and related tissues: Secondary | ICD-10-CM

## 2018-08-13 DIAGNOSIS — E118 Type 2 diabetes mellitus with unspecified complications: Secondary | ICD-10-CM

## 2018-08-13 DIAGNOSIS — Z885 Allergy status to narcotic agent status: Secondary | ICD-10-CM

## 2018-08-13 DIAGNOSIS — Z9049 Acquired absence of other specified parts of digestive tract: Secondary | ICD-10-CM

## 2018-08-13 DIAGNOSIS — R41841 Cognitive communication deficit: Secondary | ICD-10-CM | POA: Diagnosis not present

## 2018-08-13 DIAGNOSIS — Z7984 Long term (current) use of oral hypoglycemic drugs: Secondary | ICD-10-CM

## 2018-08-13 DIAGNOSIS — M2550 Pain in unspecified joint: Secondary | ICD-10-CM | POA: Diagnosis not present

## 2018-08-13 DIAGNOSIS — R262 Difficulty in walking, not elsewhere classified: Secondary | ICD-10-CM | POA: Diagnosis not present

## 2018-08-13 DIAGNOSIS — Z90710 Acquired absence of both cervix and uterus: Secondary | ICD-10-CM

## 2018-08-13 DIAGNOSIS — Z7901 Long term (current) use of anticoagulants: Secondary | ICD-10-CM

## 2018-08-13 DIAGNOSIS — R070 Pain in throat: Secondary | ICD-10-CM | POA: Diagnosis not present

## 2018-08-13 DIAGNOSIS — Z888 Allergy status to other drugs, medicaments and biological substances status: Secondary | ICD-10-CM | POA: Diagnosis not present

## 2018-08-13 DIAGNOSIS — Z8249 Family history of ischemic heart disease and other diseases of the circulatory system: Secondary | ICD-10-CM | POA: Diagnosis not present

## 2018-08-13 DIAGNOSIS — Z8349 Family history of other endocrine, nutritional and metabolic diseases: Secondary | ICD-10-CM | POA: Diagnosis not present

## 2018-08-13 DIAGNOSIS — Z87730 Personal history of (corrected) cleft lip and palate: Secondary | ICD-10-CM

## 2018-08-13 DIAGNOSIS — Z79891 Long term (current) use of opiate analgesic: Secondary | ICD-10-CM

## 2018-08-13 DIAGNOSIS — M6281 Muscle weakness (generalized): Secondary | ICD-10-CM | POA: Diagnosis not present

## 2018-08-13 DIAGNOSIS — I4891 Unspecified atrial fibrillation: Secondary | ICD-10-CM | POA: Diagnosis not present

## 2018-08-13 DIAGNOSIS — R2681 Unsteadiness on feet: Secondary | ICD-10-CM | POA: Diagnosis not present

## 2018-08-13 DIAGNOSIS — R5381 Other malaise: Secondary | ICD-10-CM | POA: Diagnosis not present

## 2018-08-13 LAB — CBC WITH DIFFERENTIAL/PLATELET
Abs Immature Granulocytes: 0.05 10*3/uL (ref 0.00–0.07)
Basophils Absolute: 0 10*3/uL (ref 0.0–0.1)
Basophils Relative: 0 %
Eosinophils Absolute: 0 10*3/uL (ref 0.0–0.5)
Eosinophils Relative: 0 %
HCT: 39.5 % (ref 36.0–46.0)
Hemoglobin: 12.1 g/dL (ref 12.0–15.0)
Immature Granulocytes: 0 %
LYMPHS PCT: 6 %
Lymphs Abs: 0.9 10*3/uL (ref 0.7–4.0)
MCH: 27.3 pg (ref 26.0–34.0)
MCHC: 30.6 g/dL (ref 30.0–36.0)
MCV: 89.2 fL (ref 80.0–100.0)
Monocytes Absolute: 0.9 10*3/uL (ref 0.1–1.0)
Monocytes Relative: 6 %
Neutro Abs: 13.1 10*3/uL — ABNORMAL HIGH (ref 1.7–7.7)
Neutrophils Relative %: 88 %
Platelets: 241 10*3/uL (ref 150–400)
RBC: 4.43 MIL/uL (ref 3.87–5.11)
RDW: 15.1 % (ref 11.5–15.5)
WBC: 15 10*3/uL — ABNORMAL HIGH (ref 4.0–10.5)
nRBC: 0 % (ref 0.0–0.2)

## 2018-08-13 LAB — BASIC METABOLIC PANEL
Anion gap: 11 (ref 5–15)
BUN: 14 mg/dL (ref 8–23)
CHLORIDE: 103 mmol/L (ref 98–111)
CO2: 25 mmol/L (ref 22–32)
CREATININE: 0.72 mg/dL (ref 0.44–1.00)
Calcium: 9 mg/dL (ref 8.9–10.3)
GFR calc Af Amer: >60 mL/min (ref >60)
GFR calc non Af Amer: >60 mL/min (ref >60)
Glucose, Bld: 110 mg/dL — ABNORMAL HIGH (ref 70–99)
Potassium: 4 mmol/L (ref 3.5–5.1)
SODIUM: 139 mmol/L (ref 135–145)

## 2018-08-13 LAB — PROTIME-INR
INR: 2.13
Prothrombin Time: 23.6 seconds — ABNORMAL HIGH (ref 11.4–15.2)

## 2018-08-13 LAB — GLUCOSE, CAPILLARY: GLUCOSE-CAPILLARY: 78 mg/dL (ref 70–99)

## 2018-08-13 LAB — CBG MONITORING, ED: Glucose-Capillary: 85 mg/dL (ref 70–99)

## 2018-08-13 MED ORDER — IBUPROFEN 100 MG/5ML PO SUSP
400.0000 mg | Freq: Four times a day (QID) | ORAL | Status: DC
  Administered 2018-08-15: 400 mg via ORAL
  Filled 2018-08-13 (×9): qty 20

## 2018-08-13 MED ORDER — PANTOPRAZOLE SODIUM 40 MG PO TBEC
40.0000 mg | DELAYED_RELEASE_TABLET | Freq: Two times a day (BID) | ORAL | Status: DC
  Administered 2018-08-14 – 2018-08-18 (×9): 40 mg via ORAL
  Filled 2018-08-13 (×10): qty 1

## 2018-08-13 MED ORDER — BUDESONIDE 0.5 MG/2ML IN SUSP
0.5000 mg | Freq: Two times a day (BID) | RESPIRATORY_TRACT | Status: DC
  Administered 2018-08-13 – 2018-08-18 (×10): 0.5 mg via RESPIRATORY_TRACT
  Filled 2018-08-13 (×11): qty 2

## 2018-08-13 MED ORDER — CLINDAMYCIN PHOSPHATE 600 MG/50ML IV SOLN
600.0000 mg | Freq: Once | INTRAVENOUS | Status: AC
  Administered 2018-08-13: 600 mg via INTRAVENOUS
  Filled 2018-08-13: qty 50

## 2018-08-13 MED ORDER — LEVOTHYROXINE SODIUM 75 MCG PO TABS
75.0000 ug | ORAL_TABLET | Freq: Every day | ORAL | Status: DC
  Administered 2018-08-14 – 2018-08-18 (×5): 75 ug via ORAL
  Filled 2018-08-13 (×5): qty 1

## 2018-08-13 MED ORDER — ACETAMINOPHEN 650 MG RE SUPP: 650.0000 mg | Freq: Four times a day (QID) | RECTAL | Status: DC | PRN

## 2018-08-13 MED ORDER — NITROGLYCERIN 0.4 MG/SPRAY TL SOLN
1.0000 | Status: DC | PRN
  Filled 2018-08-13: qty 12

## 2018-08-13 MED ORDER — INSULIN ASPART 100 UNIT/ML ~~LOC~~ SOLN
0.0000 [IU] | Freq: Three times a day (TID) | SUBCUTANEOUS | Status: DC
  Administered 2018-08-14: 2 [IU] via SUBCUTANEOUS
  Administered 2018-08-14: 3 [IU] via SUBCUTANEOUS
  Administered 2018-08-15 – 2018-08-17 (×3): 1 [IU] via SUBCUTANEOUS

## 2018-08-13 MED ORDER — ALBUTEROL SULFATE (2.5 MG/3ML) 0.083% IN NEBU: 2.5000 mg | INHALATION_SOLUTION | Freq: Four times a day (QID) | RESPIRATORY_TRACT | Status: DC | PRN

## 2018-08-13 MED ORDER — SODIUM CHLORIDE 0.9 % IV SOLN
3.0000 g | Freq: Four times a day (QID) | INTRAVENOUS | Status: DC
  Administered 2018-08-13 – 2018-08-17 (×15): 3 g via INTRAVENOUS
  Filled 2018-08-13 (×17): qty 3

## 2018-08-13 MED ORDER — IOHEXOL 300 MG/ML  SOLN
75.0000 mL | Freq: Once | INTRAMUSCULAR | Status: AC | PRN
  Administered 2018-08-13: 75 mL via INTRAVENOUS

## 2018-08-13 MED ORDER — DIPHENOXYLATE-ATROPINE 2.5-0.025 MG PO TABS
1.0000 | ORAL_TABLET | Freq: Four times a day (QID) | ORAL | Status: DC | PRN
  Administered 2018-08-16 – 2018-08-18 (×4): 1 via ORAL
  Filled 2018-08-13 (×5): qty 1

## 2018-08-13 MED ORDER — ACETAMINOPHEN 325 MG PO TABS: 650.0000 mg | ORAL_TABLET | Freq: Four times a day (QID) | ORAL | Status: DC | PRN

## 2018-08-13 MED ORDER — ATORVASTATIN CALCIUM 40 MG PO TABS
40.0000 mg | ORAL_TABLET | Freq: Every day | ORAL | Status: DC
  Administered 2018-08-13 – 2018-08-18 (×6): 40 mg via ORAL
  Filled 2018-08-13 (×6): qty 1

## 2018-08-13 MED ORDER — LOSARTAN POTASSIUM 50 MG PO TABS
25.0000 mg | ORAL_TABLET | Freq: Every day | ORAL | Status: DC
  Administered 2018-08-14: 25 mg via ORAL
  Filled 2018-08-13: qty 1

## 2018-08-13 MED ORDER — FLUTICASONE PROPIONATE HFA 110 MCG/ACT IN AERO: 2.0000 | INHALATION_SPRAY | Freq: Two times a day (BID) | RESPIRATORY_TRACT | Status: DC

## 2018-08-13 MED ORDER — DILTIAZEM HCL ER COATED BEADS 180 MG PO CP24
180.0000 mg | ORAL_CAPSULE | Freq: Every day | ORAL | Status: DC
  Administered 2018-08-13 – 2018-08-18 (×6): 180 mg via ORAL
  Filled 2018-08-13 (×6): qty 1

## 2018-08-13 MED ORDER — MORPHINE SULFATE (PF) 2 MG/ML IV SOLN
1.0000 mg | INTRAVENOUS | Status: DC | PRN
  Administered 2018-08-13 – 2018-08-14 (×2): 1 mg via INTRAVENOUS
  Filled 2018-08-13 (×2): qty 1

## 2018-08-13 MED ORDER — MECLIZINE HCL 25 MG PO TABS: 25.0000 mg | ORAL_TABLET | Freq: Three times a day (TID) | ORAL | Status: DC | PRN

## 2018-08-13 MED ORDER — ONDANSETRON HCL 4 MG PO TABS: 4.0000 mg | ORAL_TABLET | Freq: Four times a day (QID) | ORAL | Status: DC | PRN

## 2018-08-13 MED ORDER — METHYLPREDNISOLONE SODIUM SUCC 125 MG IJ SOLR
125.0000 mg | Freq: Once | INTRAMUSCULAR | Status: AC
  Administered 2018-08-13: 125 mg via INTRAVENOUS
  Filled 2018-08-13: qty 2

## 2018-08-13 MED ORDER — ESCITALOPRAM OXALATE 20 MG PO TABS
20.0000 mg | ORAL_TABLET | Freq: Every day | ORAL | Status: DC
  Administered 2018-08-13 – 2018-08-18 (×6): 20 mg via ORAL
  Filled 2018-08-13 (×6): qty 1

## 2018-08-13 MED ORDER — ONDANSETRON HCL 4 MG/2ML IJ SOLN
4.0000 mg | Freq: Four times a day (QID) | INTRAMUSCULAR | Status: DC | PRN
  Administered 2018-08-17: 4 mg via INTRAVENOUS
  Filled 2018-08-13: qty 2

## 2018-08-13 MED ORDER — SODIUM CHLORIDE 0.9 % IV SOLN
INTRAVENOUS | Status: DC
  Administered 2018-08-13 – 2018-08-16 (×2): via INTRAVENOUS

## 2018-08-13 NOTE — ED Notes (Signed)
Patient transported to CT 

## 2018-08-13 NOTE — ED Provider Notes (Addendum)
[facility] EMERGENCY DEPARTMENT Provider Note   CSN: [id number] Arrival date & time: 08/13/18  8938    History   Chief Complaint Chief Complaint  Patient presents with  . Sore Throat    HPI [name] is a 78 y.o. female with a history as outlined below significant for cleft palate with surgical repair x4, non-insulin-dependent diabetes, CAD, GERD, paroxysmal atrial fibrillation on chronic Coumadin therapy presenting with a 4-day history of throat pain.  Initially she experienced pain in her left ear but reports it was worsened with swallowing.  She was seen by her primary MD 4 days ago for general follow-up, then called her PCP yesterday stating she was having ear pain and was called in a course of amoxicillin which she started yesterday.  She endorses worsening pain and swelling sensation in the left ear with radiation into her jaw and neck area.  She denies fevers or chills.  She also endorses a cough with occasional production of sputum.  She denies shortness of breath.  The history is provided by the patient.    Past Medical History:  Diagnosis Date  . Allergy   . Allergy history unknown   . Arthritis   . ASCVD (arteriosclerotic cardiovascular disease)    stent to mid and proximal left anterior descending in 06/2002;drug eluting stent placed in the second diagnol in 08/2003 after  A  non-st elevation myocardial infarction   . Asthma   . Atrial fibrillation ([place])   . Chronic anticoagulation   . Chronic nausea   . Chronic pain of left knee   . Coronary artery disease   . Current chronic use of systemic steroids 05/11/2016  . Depression   . Diabetes mellitus    no insulin  . Diarrhea    acute  . Diverticulitis of intestine   . FH: colonic polyps    adenomataous  . GERD (gastroesophageal reflux disease)   . History of kidney stones   . Hyperlipidemia    pulmonary embolism 2000 and 09/2008  . Hypertension   . Hypothyroidism   . Insomnia   . Irritable bowel syndrome    . Myocardial infarction ([place])   . Paroxysmal atrial fibrillation (HCC)    normal LV function; episodes occurred in 205 and 09/2007  . Pedal edema   . Peripheral edema   . PONV (postoperative nausea and vomiting)   . Pulmonary embolism ([place])    2000/09/2008  . Rectal bleeding   . Sleep apnea   . Thyroid disease    hypothyroidism  . Tobacco user    stopped   . Venous stasis     Patient Active Problem List   Diagnosis Date Noted  . Nausea vomiting and diarrhea 05/11/2016  . Obesity 05/11/2016  . 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 Insomnia 04/15/2016  . Hyperkalemia 04/08/2016  . Volume depletion 04/08/2016  . AKI (acute kidney injury) ([place]) 04/08/2016  . Depression 04/07/2016  . Nausea without vomiting 05/09/2014  . Melena 05/08/2014  . UGI bleed 05/08/2014  . Dysphagia, unspecified(787.20) 10/26/2013  . Difficulty walking 10/19/2012  . Weakness of left leg 10/19/2012  . Long term current use of anticoagulant therapy 09/26/2012  . Dysphagia 05/04/2012  . Microcytic anemia 08/10/2011  . Acute respiratory failure ([place]) 08/09/2011  . IBS (irritable bowel syndrome) 08/09/2011  . DOE (dyspnea on exertion) 03/05/2011  . Anemia 03/05/2011  . Peripheral neuropathy 10/02/2010  . 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  DIARRHEA, ACUTE 12/05/2008  . Hypothyroidism 12/02/2008  . Controlled type 2 diabetes mellitus (HCC) 12/02/2008  . Hyperlipidemia LDL goal <70 12/02/2008  . Essential hypertension 12/02/2008  . Coronary atherosclerosis 12/02/2008  . Chronic atrial fibrillation 12/02/2008  . Asthma 12/02/2008  . GERD 12/02/2008  . TOBACCO USE, QUIT 12/02/2008    Past Surgical History:  Procedure Laterality Date  . ABDOMINAL HYSTERECTOMY    . APPENDECTOMY    . BALLOON DILATION N/A 11/11/2013   Procedure: BALLOON DILATION;  Surgeon: Najeeb U Rehman, MD;  Location: [facility];  Service: Endoscopy;  Laterality: N/A;  .  BREAST REDUCTION SURGERY    . BREAST SURGERY    . CHOLECYSTECTOMY    . CLEFT PALATE REPAIR     4 surgeries  . COLONOSCOPY  2011   negative  . COLONOSCOPY N/A 05/10/2014   Procedure: COLONOSCOPY;  Surgeon: Najeeb U Rehman, MD;  Location: [facility];  Service: Endoscopy;  Laterality: N/A;  . ESOPHAGEAL DILATION N/A 03/08/2016   Procedure: ESOPHAGEAL DILATION;  Surgeon: Najeeb U Rehman, MD;  Location: [facility];  Service: Endoscopy;  Laterality: N/A;  . ESOPHAGOGASTRODUODENOSCOPY N/A 11/11/2013   Procedure: ESOPHAGOGASTRODUODENOSCOPY (EGD);  Surgeon: Najeeb U Rehman, MD;  Location: [facility];  Service: Endoscopy;  Laterality: N/A;  200-moved to 100 Ann notified pt  . ESOPHAGOGASTRODUODENOSCOPY N/A 05/09/2014   Procedure: ESOPHAGOGASTRODUODENOSCOPY (EGD);  Surgeon: Najeeb U Rehman, MD;  [facility]: [facility];  Service: Endoscopy;  Laterality: N/A;  . ESOPHAGOGASTRODUODENOSCOPY N/A 03/08/2016   Procedure: ESOPHAGOGASTRODUODENOSCOPY (EGD);  Surgeon: Najeeb U Rehman, MD;  [facility]: [facility];  Service: Endoscopy;  Laterality: N/A;  2:20  . HEEL SPUR SURGERY    . KNEE SURGERY    . MALONEY DILATION N/A 11/11/2013   Procedure: MALONEY DILATION;  Surgeon: Najeeb U Rehman, MD;  [facility]: [facility];  Service: Endoscopy;  Laterality: N/A;  . OOPHORECTOMY    . SAVORY DILATION N/A 11/11/2013   Procedure: SAVORY DILATION;  Surgeon: Najeeb U Rehman, MD;  [facility]: [facility];  Service: Endoscopy;  Laterality: N/A;  . TONSILLECTOMY       OB History   No obstetric history on file.      Home Medications    Prior to Admission medications   Medication Sig Start Date End Date Taking? Authorizing Provider  acetaminophen (TYLENOL) 500 MG tablet Take 500 mg by mouth daily as needed for headache.    [provider]  albuterol (PROVENTIL) (2.5 MG/3ML) 0.083% nebulizer solution Take 3 mLs (2.5 mg total) by nebulization every 6 (six) hours as needed for wheezing or shortness of  breath. 06/05/16   Luking, William S, MD  albuterol (VENTOLIN HFA) 108 (90 Base) MCG/ACT inhaler INHALE 2 PUFFS INTO THE LUNGS 4 TIMES DAILY AS NEEDED FOR WHEEZING. 03/04/18   Luking, William S, MD  albuterol (VENTOLIN HFA) 108 (90 Base) MCG/ACT inhaler INHALE 2 PUFFS INTO THE LUNGS 4 TIMES DAILY AS NEEDED FOR WHEEZING. 04/28/18   Luking, William S, MD  amoxicillin (AMOXIL) 500 MG capsule Take one capsule by mouth three times daily for 10 days. 08/12/18   Luking, William S, MD  atorvastatin (LIPITOR) 40 MG tablet TAKE ONE TABLET BY MOUTH DAILY. 06/22/18   Koneswaran, Suresh A, MD  blood glucose meter kit and supplies KIT Dispense based on patient and insurance preference. Test once a day. Dx: E11.9 04/28/18   Luking, William S, MD  Cholecalciferol (VITAMIN D3) 1000 units CAPS Take 1 capsule every evening   by mouth.     [provider]  ciprofloxacin (CIPRO) 500 MG tablet Take 1 tablet (500 mg total) by mouth every 12 (twelve) hours. 04/24/18   Petrucelli, Samantha R, PA-C  diltiazem (CARDIZEM CD) 180 MG 24 hr capsule TAKE ONE CAPSULE BY MOUTH DAILY. 08/15/16   Koneswaran, Suresh A, MD  diphenoxylate-atropine (LOMOTIL) 2.5-0.025 MG tablet Up to twice as day as needed 04/30/18   Setzer, Terri L, NP  diphenoxylate-atropine (LOMOTIL) 2.5-0.025 MG tablet Twice a day as needed 06/04/18   Setzer, Terri L, NP  diphenoxylate-atropine (LOMOTIL) 2.5-0.025 MG tablet TAKE ONE TABLET BY MOUTH 2 TIMES A DAY AS NEEDED. 07/08/18   Setzer, Terri L, NP  diphenoxylate-atropine (LOMOTIL) 2.5-0.025 MG tablet TAKE ONE TABLET BY MOUTH 2 TIMES A DAY AS NEEDED. 08/10/18   Setzer, Terri L, NP  escitalopram (LEXAPRO) 20 MG tablet TAKE ONE TABLET BY MOUTH ONCE DAILY. 03/04/18   Luking, William S, MD  fluticasone (FLOVENT HFA) 110 MCG/ACT inhaler Inhale 2 puffs into the lungs 2 (two) times daily. 03/04/18   Luking, William S, MD  furosemide (LASIX) 40 MG tablet TAKE ONE TABLET BY MOUTH DAILY. 07/20/18   Koneswaran, Suresh A, MD    levothyroxine (SYNTHROID, LEVOTHROID) 75 MCG tablet Take 1 tablet (75 mcg total) by mouth daily. 06/29/18   Luking, William S, MD  losartan (COZAAR) 25 MG tablet TAKE 1 TABLET BY MOUTH ONCE DAILY. 02/03/18   Koneswaran, Suresh A, MD  meclizine (ANTIVERT) 25 MG tablet Take 1 tablet (25 mg total) by mouth 3 (three) times daily as needed for dizziness. 04/14/18   Luking, William S, MD  metroNIDAZOLE (FLAGYL) 500 MG tablet Take 1 tablet (500 mg total) by mouth 3 (three) times daily. 04/24/18   Petrucelli, Samantha R, PA-C  neomycin-polymyxin-hydrocortisone (CORTISPORIN) OTIC solution Place 4 drops into the left ear 4 (four) times daily. 08/10/18   Luking, William S, MD  nitroGLYCERIN (NITROLINGUAL) 0.4 MG/SPRAY spray Place 1 spray under the tongue every 5 (five) minutes x 3 doses as needed for chest pain. 06/04/16   Koneswaran, Suresh A, MD  ONE TOUCH ULTRA TEST test strip USE TO TEST BLOOD SUGAR ONCE DAILY. 03/24/18   Luking, William S, MD  Oxycodone HCl 10 MG TABS Take 1 tablet (10 mg total) by mouth 2 (two) times daily as needed (FOR PAIN). 07/15/18   Luking, William S, MD  pantoprazole (PROTONIX) 40 MG tablet Take 1 tablet (40 mg total) by mouth 2 (two) times daily before a meal. 02/23/16   Setzer, Terri L, NP  Potassium Chloride ER 20 MEQ TBCR One po daily 08/15/17   Luking, William S, MD  promethazine (PHENERGAN) 25 MG tablet Take 1/2 tablet Bid prn nausea. Caution Drowiness. 05/11/18   Luking, Scott A, MD  triamcinolone cream (KENALOG) 0.1 % APPLY TO AFFECTED AREAS TWICE DAILY. 07/31/18   Luking, William S, MD  warfarin (COUMADIN) 5 MG tablet TAKE ONE TABLET BY MOUTH ONCE DAILY. TAKE AS DIRECTED BY DOCTOR. 07/20/18   Luking, William S, MD  ferrous sulfate 325 (65 FE) MG tablet Take 1 tablet (325 mg total) by mouth daily with breakfast. IRON SUPPLEMENT. 08/12/11 09/13/11  Fisher, Denise, MD  ipratropium (ATROVENT) 0.02 % nebulizer solution Take 500 mcg by nebulization every 4 (four) hours as needed. For shortness  of breath  09/13/11  [provider]    Family History Family History  Problem Relation Age of Onset  . Heart attack Mother   . Diabetes Mother   .   Hyperlipidemia Mother   . Heart attack Father   . Lung cancer Sister   . Lymphoma Brother   . Colon cancer Neg Hx     Social History Social History   Tobacco Use  . Smoking status: Former Smoker    Packs/day: 2.00    Years: 30.00    Pack years: 60.00    Types: Cigarettes    Start date: 05/05/1987  . Smokeless tobacco: Never Used  Substance Use Topics  . Alcohol use: No    Alcohol/week: 0.0 standard drinks  . Drug use: No     Allergies   Cefzil [cefprozil] and Demerol   Review of Systems Review of Systems  Constitutional: Positive for fever. Negative for chills.  HENT: Positive for ear pain, sore throat, trouble swallowing and voice change. Negative for congestion, ear discharge, rhinorrhea and sinus pressure.   Eyes: Negative for discharge.  Respiratory: Positive for cough. Negative for chest tightness, shortness of breath, wheezing and stridor.   Cardiovascular: Negative for chest pain.  Gastrointestinal: Negative for abdominal pain.  Genitourinary: Negative.      Physical Exam Updated Vital Signs BP (!) 165/80 (BP Location: Left Arm)   Pulse 60   Temp 99.2 F (37.3 C) (Oral)   Resp 18   Ht 5' 1" (1.549 m)   Wt 90.7 kg   SpO2 95%   BMI 37.79 kg/m   Physical Exam Constitutional:      Appearance: She is well-developed.  HENT:     Head: Normocephalic and atraumatic.     Right Ear: Tympanic membrane and ear canal normal.     Left Ear: Tympanic membrane and ear canal normal.     Nose: Mucosal edema present. No rhinorrhea.     Mouth/Throat:     Mouth: Mucous membranes are moist.     Palate: Mass present.     Pharynx: Uvula midline. Posterior oropharyngeal erythema present. No oropharyngeal exudate or uvula swelling.     Tonsils: Tonsillar abscess present.     Comments: Edema and erythema of  left soft palate.  Her pharynx is otherwise patent.  There is no exudate.  There is a well-healed midline incision. Eyes:     Conjunctiva/sclera: Conjunctivae normal.  Neck:     Musculoskeletal: Normal range of motion.     Comments: Phonation is coarse. Cardiovascular:     Rate and Rhythm: Normal rate.     Heart sounds: Normal heart sounds.  Pulmonary:     Effort: Pulmonary effort is normal. No respiratory distress.     Breath sounds: No wheezing or rales.  Abdominal:     Palpations: Abdomen is soft.     Tenderness: There is no abdominal tenderness.  Musculoskeletal: Normal range of motion.  Skin:    General: Skin is warm and dry.     Findings: No rash.  Neurological:     Mental Status: She is alert and oriented to person, place, and time.      ED Treatments / Results  Labs (all labs ordered are listed, but only abnormal results are displayed) Labs Reviewed  CBC WITH DIFFERENTIAL/PLATELET - Abnormal; Notable for the following components:      Result Value   WBC 15.0 (*)    Neutro Abs 13.1 (*)    All other components within normal limits  BASIC METABOLIC PANEL - Abnormal; Notable for the following components:   Glucose, Bld 110 (*)    All other components within normal limits  PROTIME-INR - Abnormal; Notable for  the following components:   Prothrombin Time 23.6 (*)    All other components within normal limits    EKG None  Radiology Ct Soft Tissue Neck W Contrast  Result Date: 08/13/2018 CLINICAL DATA:  Throat pain and difficulty swallowing. EXAM: CT NECK WITH CONTRAST TECHNIQUE: Multidetector CT imaging of the neck was performed using the standard protocol following the bolus administration of intravenous contrast. CONTRAST:  75mL OMNIPAQUE IOHEXOL 300 MG/ML  SOLN COMPARISON:  None. FINDINGS: Pharynx and larynx: There is a large tonsillar abscess on the LEFT, roughly 2.2 x 2.1 cm cross-section. There is surrounding LEFT parapharyngeal inflammation, but no frank  peritonsillar abscess. There is also abnormal retropharyngeal fluid, possible retropharyngeal abscess, 13 x 7 mm cross-section extending over at least 2-3 vertebral body segments. Abnormal inflammatory change extends into the paraglottic region on the LEFT. This results in significant glottic narrowing. Correlate clinically for symptoms of stridor or airway compromise. Urgent ENT consultation is recommended. Salivary glands: No inflammation, mass, or stone. Thyroid: Normal. Lymph nodes: Reactive cervical lymph nodes. Vascular: Patent. Atherosclerosis. Limited intracranial: Negative. Visualized orbits: Negative. Mastoids and visualized paranasal sinuses: No definite layering sinus fluid. Skeleton: Spondylosis. Upper chest: No mass or pneumothorax. No subglottic narrowing. Other: None. IMPRESSION: 1. Large tonsillar abscess on the LEFT, roughly 2.2 x 2.1 cm cross-section. Surrounding LEFT parapharyngeal inflammation, but no frank peritonsillar abscess. 2. Inflammatory change extends caudally into the LEFT paraglottic space, and there is effacement of the supraglottic airway. Urgent ENT consultation is warranted. 3. Retropharyngeal effusion versus abscess, 13 x 7 mm cross-section extending over at least 2-3 vertebral body segments. 4. These results were called by telephone at the time of interpretation on 08/13/2018 at 2:07 pm to Dr. Zammit , who verbally acknowledged these results. Electronically Signed   By: John T Curnes M.D.   On: 08/13/2018 14:09    Procedures Procedures (including critical care time)  Medications Ordered in ED Medications  iohexol (OMNIPAQUE) 300 MG/ML solution 75 mL (75 mLs Intravenous Contrast Given 08/13/18 1338)  clindamycin (CLEOCIN) IVPB 600 mg (600 mg Intravenous New Bag/Given 08/13/18 1433)     Initial Impression / Assessment and Plan / ED Course  I have reviewed the triage vital signs and the nursing notes.  Pertinent labs & imaging results that were available during my care  of the patient were reviewed by me and considered in my medical decision making (see chart for details).        Patient discussed with Dr. Rosen who agrees with need for admission.  He will follow, will see this evening or first thing tomorrow morning.  Agrees with clindamycin as already given.  Requests hospitalist admission and transferred to [facility].  Discussed with Dr. Arrien who will admit patient and get transferred to [facility] for further management of this infection.   Final Clinical Impressions(s) / ED Diagnoses   Final diagnoses:  Tonsillar abscess    ED Discharge Orders    None       Idol, Julie, PA-C 08/13/18 1539    Idol, Julie, PA-C 08/13/18 1549    Zammit, Joseph, MD 08/14/18 1503  

## 2018-08-13 NOTE — ED Notes (Signed)
Pt returned from CT °

## 2018-08-13 NOTE — ED Triage Notes (Signed)
Pt saw PCP on Monday, sore throat and ear ache, treated from ear infection. Pt states she is no better.  Cough with green sputum

## 2018-08-13 NOTE — Telephone Encounter (Signed)
ok 

## 2018-08-13 NOTE — Progress Notes (Signed)
Pharmacy Antibiotic Note  [name] is a 78 y.o. female admitted on 08/13/2018 with tonsillar abscess.  Pharmacy has been consulted for unasyn dosing.  Plan: unasyn 3gm iv q6h    Height: 5\' 1"  (154.9 cm) Weight: 200 lb (90.7 kg) IBW/kg (Calculated) : 47.8  Temp (24hrs), Avg:99.2 F (37.3 C), Min:99.2 F (37.3 C), Max:99.2 F (37.3 C)  Recent Labs  Lab 08/13/18 1205  WBC 15.0*  CREATININE 0.72    Estimated Creatinine Clearance: 58.5 mL/min (by C-G formula based on SCr of 0.72 mg/dL).    Allergies  Allergen Reactions  . Cefzil [Cefprozil] Nausea And Vomiting  . Demerol Nausea And Vomiting    Antimicrobials this admission: 2/20 unasyn >>  2/20 clindamycin x 1     Thank you for allowing pharmacy to be a part of this patient's care.  Donna Christen Gabreille Dardis 08/13/2018 5:05 PM

## 2018-08-13 NOTE — H&P (Signed)
History and Physical    Chiyeko P Dittmer ZOX:[id number] DOB: [birth year] DOA: 08/13/2018  PCP: Mikey Kirschner, MD   Patient coming from: Home   Chief Complaint: Left ear and throat pain.   HPI: [name] is a 78 y.o. female with medical history significant of type 2 diabetes mellitus, hypertension, dyslipidemia, atrial fibrillation and hypothyroidism who presents with left throat pain.  Her symptoms started 10 days ago with left ear pain, dull in nature, with no improving or worsening factors, that progressed into left throat pain.  Her symptoms have been worsening to the point where she has difficulty speaking and swallowing, denies any fevers and chills.  Positive nausea but no vomiting, significant decreased p.o. intake.  Patient had ear infections in the past but this is the first time she is experiencing this type of deep pain on the left throat.  Her diabetes is diet-controlled.   ED Course: Patient was diagnosed with a left tonsillar abscess, she received IV clindamycin, ENT Dr. Constance Holster was consulted, recommendations to admit patient for IV antibiotic therapy and further medical management.  Patient will be transferred to [facility].  Review of Systems:  1. General: No fevers, no chills, no weight gain or weight loss 2. ENT: No runny nose or sore throat, no hearing disturbances/positive left ear pain, sinus congestion and postnasal dripping. 3. Pulmonary: No dyspnea, cough, wheezing, or hemoptysis 4. Cardiovascular: No angina, claudication, lower extremity edema, pnd or orthopnea 5. Gastrointestinal: Positive nausea, but no vomiting, no diarrhea or constipation 6. Hematology: No easy bruisability or frequent infections 7. Urology: No dysuria, hematuria or increased urinary frequency 8. Dermatology: No rashes. 9. Neurology: No seizures or paresthesias 10. Musculoskeletal: No joint pain or deformities  Past Medical History:  Diagnosis Date  . Allergy   . Allergy history unknown    . Arthritis   . ASCVD (arteriosclerotic cardiovascular disease)    stent to mid and proximal left anterior descending in 06/2002;drug eluting stent placed in the second diagnol in 08/2003 after  A  non-st elevation myocardial infarction   . Asthma   . Atrial fibrillation ([place])   . Chronic anticoagulation   . Chronic nausea   . Chronic pain of left knee   . Coronary artery disease   . Current chronic use of systemic steroids 05/11/2016  . Depression   . Diabetes mellitus    no insulin  . Diarrhea    acute  . Diverticulitis of intestine   . FH: colonic polyps    adenomataous  . GERD (gastroesophageal reflux disease)   . History of kidney stones   . Hyperlipidemia    pulmonary embolism 2000 and 09/2008  . Hypertension   . Hypothyroidism   . Insomnia   . Irritable bowel syndrome   . Myocardial infarction ([employer] [place])   . Paroxysmal atrial fibrillation ([facility])    normal LV function; episodes occurred in 205 and 09/2007  . Pedal edema   . Peripheral edema   . PONV (postoperative nausea and vomiting)   . Pulmonary embolism ([place])    2000/09/2008  . Rectal bleeding   . Sleep apnea   . Thyroid disease    hypothyroidism  . Tobacco user    stopped   . Venous stasis     Past Surgical History:  Procedure Laterality Date  . ABDOMINAL HYSTERECTOMY    . APPENDECTOMY    . BALLOON DILATION N/A 11/11/2013   Procedure: BALLOON DILATION;  Surgeon: Rogene Houston, MD;  [facility];  Service: Endoscopy;  Laterality: N/A;  . BREAST REDUCTION SURGERY    . BREAST SURGERY    . CHOLECYSTECTOMY    . CLEFT PALATE REPAIR     4 surgeries  . COLONOSCOPY  2011   negative  . COLONOSCOPY N/A 05/10/2014   Procedure: COLONOSCOPY;  Surgeon: Rogene Houston, MD;  Location: [facility] END[facility];  Service: Endoscopy;  Laterality: N/A;  . ESOPHAGEAL DILATION N/A 03/08/2016   Procedure: ESOPHAGEAL DILATION;  Surgeon: Rogene Houston, MD;  Location: [facility];  Service: Endoscopy;  Laterality: N/A;  .  ESOPHAGOGASTRODUODENOSCOPY N/A 11/11/2013   Procedure: ESOPHAGOGASTRODUODENOSCOPY (EGD);  Surgeon: Rogene Houston, MD;  Location[facility];  Service: Endoscopy;  Laterality: N/A;  200-moved to 100 Ann notified pt  . ESOPHAGOGASTRODUODENOSCOPY N/A 05/09/2014   Procedure: ESOPHAGOGASTRODUODENOSCOPY (EGD);  Surgeon: Rogene Houston, MD;  [facility];  Service: Endoscopy;  Laterality: N/A;  . ESOPHAGOGASTRODUODENOSCOPY N/A 03/08/2016   Procedure: ESOPHAGOGASTRODUODENOSCOPY (EGD);  Surgeon: Rogene Houston, MD;  [facility];  Service: Endoscopy;  Laterality: N/A;  2:20  . HEEL SPUR SURGERY    . KNEE SURGERY    . MALONEY DILATION N/A 11/11/2013   Procedure: Venia Minks DILATION;  Surgeon: Rogene Houston, MD;  Location[facility];  Service: Endoscopy;  Laterality: N/A;  . OOPHORECTOMY    . SAVORY DILATION N/A 11/11/2013   Procedure: SAVORY DILATION;  Surgeon: Rogene Houston, MD;  Location[facility];  Service: Endoscopy;  Laterality: N/A;  . TONSILLECTOMY       reports that she has quit smoking. Her smoking use included cigarettes. She started smoking about 31 years ago. She has a 60.00 pack-year smoking history. She has never used smokeless tobacco. She reports that she does not drink alcohol or use drugs.  Allergies  Allergen Reactions  . Cefzil [Cefprozil] Nausea And Vomiting  . Demerol Nausea And Vomiting    Family History  Problem Relation Age of Onset  . Heart attack Mother   . Diabetes Mother   . Hyperlipidemia Mother   . Heart attack Father   . Lung cancer Sister   . Lymphoma Brother   . Colon cancer Neg Hx      Prior to Admission medications   Medication Sig Start Date End Date Taking? Authorizing Provider  acetaminophen (TYLENOL) 500 MG tablet Take 500 mg by mouth daily as needed for headache.    [provider]  albuterol (PROVENTIL) (2.5 MG/3ML) 0.083% nebulizer solution Take 3 mLs (2.5 mg total) by nebulization every 6 (six) hours  as needed for wheezing or shortness of breath. 06/05/16   Mikey Kirschner, MD  albuterol (VENTOLIN HFA) 108 (90 Base) MCG/ACT inhaler INHALE 2 PUFFS INTO THE LUNGS 4 TIMES DAILY AS NEEDED FOR WHEEZING. 03/04/18   Mikey Kirschner, MD  albuterol (VENTOLIN HFA) 108 (90 Base) MCG/ACT inhaler INHALE 2 PUFFS INTO THE LUNGS 4 TIMES DAILY AS NEEDED FOR WHEEZING. 04/28/18   Mikey Kirschner, MD  amoxicillin (AMOXIL) 500 MG capsule Take one capsule by mouth three times daily for 10 days. 08/12/18   Mikey Kirschner, MD  atorvastatin (LIPITOR) 40 MG tablet TAKE ONE TABLET BY MOUTH DAILY. 06/22/18   Herminio Commons, MD  blood glucose meter kit and supplies KIT Dispense based on patient and insurance preference. Test once a day. Dx: E11.9 04/28/18   Mikey Kirschner, MD  Cholecalciferol (VITAMIN D3) 1000 units CAPS Take 1 capsule  every evening by mouth.     [provider]  ciprofloxacin (CIPRO) 500 MG tablet Take 1 tablet (500 mg total) by mouth every 12 (twelve) hours. 04/24/18   Petrucelli, Samantha R, PA-C  diltiazem (CARDIZEM CD) 180 MG 24 hr capsule TAKE ONE CAPSULE BY MOUTH DAILY. 08/15/16   Herminio Commons, MD  diphenoxylate-atropine (LOMOTIL) 2.5-0.025 MG tablet Up to twice as day as needed 04/30/18   Butch Penny, NP  diphenoxylate-atropine (LOMOTIL) 2.5-0.025 MG tablet Twice a day as needed 06/04/18   Setzer, Rona Ravens, NP  diphenoxylate-atropine (LOMOTIL) 2.5-0.025 MG tablet TAKE ONE TABLET BY MOUTH 2 TIMES A DAY AS NEEDED. 07/08/18   Setzer, Rona Ravens, NP  diphenoxylate-atropine (LOMOTIL) 2.5-0.025 MG tablet TAKE ONE TABLET BY MOUTH 2 TIMES A DAY AS NEEDED. 08/10/18   Setzer, Rona Ravens, NP  escitalopram (LEXAPRO) 20 MG tablet TAKE ONE TABLET BY MOUTH ONCE DAILY. 03/04/18   Mikey Kirschner, MD  fluticasone (FLOVENT HFA) 110 MCG/ACT inhaler Inhale 2 puffs into the lungs 2 (two) times daily. 03/04/18   Mikey Kirschner, MD  furosemide (LASIX) 40 MG tablet TAKE ONE TABLET BY MOUTH DAILY.  07/20/18   Herminio Commons, MD  levothyroxine (SYNTHROID, LEVOTHROID) 75 MCG tablet Take 1 tablet (75 mcg total) by mouth daily. 06/29/18   Mikey Kirschner, MD  losartan (COZAAR) 25 MG tablet TAKE 1 TABLET BY MOUTH ONCE DAILY. 02/03/18   Herminio Commons, MD  meclizine (ANTIVERT) 25 MG tablet Take 1 tablet (25 mg total) by mouth 3 (three) times daily as needed for dizziness. 04/14/18   Mikey Kirschner, MD  metroNIDAZOLE (FLAGYL) 500 MG tablet Take 1 tablet (500 mg total) by mouth 3 (three) times daily. 04/24/18   Petrucelli, Samantha R, PA-C  neomycin-polymyxin-hydrocortisone (CORTISPORIN) OTIC solution Place 4 drops into the left ear 4 (four) times daily. 08/10/18   Mikey Kirschner, MD  nitroGLYCERIN (NITROLINGUAL) 0.4 MG/SPRAY spray Place 1 spray under the tongue every 5 (five) minutes x 3 doses as needed for chest pain. 06/04/16   Herminio Commons, MD  ONE TOUCH ULTRA TEST test strip USE TO TEST BLOOD SUGAR ONCE DAILY. 03/24/18   Mikey Kirschner, MD  Oxycodone HCl 10 MG TABS Take 1 tablet (10 mg total) by mouth 2 (two) times daily as needed (FOR PAIN). 07/15/18   Mikey Kirschner, MD  pantoprazole (PROTONIX) 40 MG tablet Take 1 tablet (40 mg total) by mouth 2 (two) times daily before a meal. 02/23/16   Setzer, Rona Ravens, NP  Potassium Chloride ER 20 MEQ TBCR One po daily 08/15/17   Mikey Kirschner, MD  promethazine (PHENERGAN) 25 MG tablet Take 1/2 tablet Bid prn nausea. Caution Drowiness. 05/11/18   Kathyrn Drown, MD  triamcinolone cream (KENALOG) 0.1 % APPLY TO AFFECTED AREAS TWICE DAILY. 07/31/18   Mikey Kirschner, MD  warfarin (COUMADIN) 5 MG tablet TAKE ONE TABLET BY MOUTH ONCE DAILY. TAKE AS DIRECTED BY DOCTOR. 07/20/18   Mikey Kirschner, MD  ferrous sulfate 325 (65 FE) MG tablet Take 1 tablet (325 mg total) by mouth daily with breakfast. IRON SUPPLEMENT. 08/12/11 09/13/11  Rexene Alberts, MD  ipratropium (ATROVENT) 0.02 % nebulizer solution Take 500 mcg by nebulization every 4  (four) hours as needed. For shortness of breath  09/13/11  [provider]    Physical Exam: Vitals:   08/13/18 1004 08/13/18 1005  BP: (!) 165/80   Pulse: 60   Resp: 18  Temp: 99.2 F (37.3 C)   TempSrc: Oral   SpO2: 95%   Weight:  90.7 kg  Height:  '5\' 1"'$  (1.549 m)    Vitals:   08/13/18 1004 08/13/18 1005  BP: (!) 165/80   Pulse: 60   Resp: 18   Temp: 99.2 F (37.3 C)   TempSrc: Oral   SpO2: 95%   Weight:  90.7 kg  Height:  '5\' 1"'$  (1.549 m)   General: deconditioned and ill looking appearing.  Neurology: Awake and alert, non focal Head and Neck. Head normocephalic. Neck supple with no adenopathy or thyromegaly.  Wide neck, positive, tender lymphadenopathy on the left submandibular region. No stridor.  E ENT: mild pallor, no icterus, oral mucosa moist Cardiovascular: No JVD. S1-S2 present, rhythmic, no gallops, or rubs, positive 3.6 systolic murmur at the right upper sternal border. Trace non pitting lower extremity edema. Pulmonary: positive breath sounds bilaterally, adequate air movement, no wheezing, rhonchi or rales. Gastrointestinal. Abdomen with no organomegaly, non tender, no rebound or guarding Skin. No rashes Musculoskeletal: no joint deformities    Labs on Admission: I have personally reviewed following labs and imaging studies  CBC: Recent Labs  Lab 08/13/18 1205  WBC 15.0*  NEUTROABS 13.1*  HGB 12.1  HCT 39.5  MCV 89.2  PLT 101   Basic Metabolic Panel: Recent Labs  Lab 08/13/18 1205  NA 139  K 4.0  CL 103  CO2 25  GLUCOSE 110*  BUN 14  CREATININE 0.72  CALCIUM 9.0   GFR: Estimated Creatinine Clearance: 58.5 mL/min (by C-G formula based on SCr of 0.72 mg/dL). Liver Function Tests: No results for input(s): AST, ALT, ALKPHOS, BILITOT, PROT, ALBUMIN in the last 168 hours. No results for input(s): LIPASE, AMYLASE in the last 168 hours. No results for input(s): AMMONIA in the last 168 hours. Coagulation Profile: Recent Labs  Lab  08/10/18 1322 08/13/18 1205  INR 2.6 2.13   Cardiac Enzymes: No results for input(s): CKTOTAL, CKMB, CKMBINDEX, TROPONINI in the last 168 hours. BNP (last 3 results) No results for input(s): PROBNP in the last 8760 hours. HbA1C: No results for input(s): HGBA1C in the last 72 hours. CBG: No results for input(s): GLUCAP in the last 168 hours. Lipid Profile: No results for input(s): CHOL, HDL, LDLCALC, TRIG, CHOLHDL, LDLDIRECT in the last 72 hours. Thyroid Function Tests: No results for input(s): TSH, T4TOTAL, FREET4, T3FREE, THYROIDAB in the last 72 hours. Anemia Panel: No results for input(s): VITAMINB12, FOLATE, FERRITIN, TIBC, IRON, RETICCTPCT in the last 72 hours. Urine analysis:    Component Value Date/Time   COLORURINE YELLOW 04/24/2018 1020   APPEARANCEUR HAZY (A) 04/24/2018 1020   LABSPEC 1.029 04/24/2018 1020   PHURINE 6.0 04/24/2018 1020   GLUCOSEU NEGATIVE 04/24/2018 1020   HGBUR SMALL (A) 04/24/2018 1020   BILIRUBINUR NEGATIVE 04/24/2018 1020   KETONESUR NEGATIVE 04/24/2018 1020   PROTEINUR NEGATIVE 04/24/2018 1020   UROBILINOGEN 0.2 05/08/2014 2240   NITRITE NEGATIVE 04/24/2018 1020   LEUKOCYTESUR LARGE (A) 04/24/2018 1020    Radiological Exams on Admission: Ct Soft Tissue Neck W Contrast  Result Date: 08/13/2018 CLINICAL DATA:  Throat pain and difficulty swallowing. EXAM: CT NECK WITH CONTRAST TECHNIQUE: Multidetector CT imaging of the neck was performed using the standard protocol following the bolus administration of intravenous contrast. CONTRAST:  48m OMNIPAQUE IOHEXOL 300 MG/ML  SOLN COMPARISON:  None. FINDINGS: Pharynx and larynx: There is a large tonsillar abscess on the LEFT, roughly 2.2 x 2.1 cm cross-section. There is  surrounding LEFT parapharyngeal inflammation, but no frank peritonsillar abscess. There is also abnormal retropharyngeal fluid, possible retropharyngeal abscess, 13 x 7 mm cross-section extending over at least 2-3 vertebral body segments.  Abnormal inflammatory change extends into the paraglottic region on the LEFT. This results in significant glottic narrowing. Correlate clinically for symptoms of stridor or airway compromise. Urgent ENT consultation is recommended. Salivary glands: No inflammation, mass, or stone. Thyroid: Normal. Lymph nodes: Reactive cervical lymph nodes. Vascular: Patent. Atherosclerosis. Limited intracranial: Negative. Visualized orbits: Negative. Mastoids and visualized paranasal sinuses: No definite layering sinus fluid. Skeleton: Spondylosis. Upper chest: No mass or pneumothorax. No subglottic narrowing. Other: None. IMPRESSION: 1. Large tonsillar abscess on the LEFT, roughly 2.2 x 2.1 cm cross-section. Surrounding LEFT parapharyngeal inflammation, but no frank peritonsillar abscess. 2. Inflammatory change extends caudally into the LEFT paraglottic space, and there is effacement of the supraglottic airway. Urgent ENT consultation is warranted. 3. Retropharyngeal effusion versus abscess, 13 x 7 mm cross-section extending over at least 2-3 vertebral body segments. 4. These results were called by telephone at the time of interpretation on 08/13/2018 at 2:07 pm to Dr. Roderic Palau , who verbally acknowledged these results. Electronically Signed   By: Staci Righter M.D.   On: 08/13/2018 14:09    EKG: Independently reviewed. Na  Assessment/Plan Active Problems:   Tonsillar abscess  78 year old female with diabetes, hypertension and dyslipidemia who presents with left ear/throat pain for the last 10 days, worsening to the point where she has difficulty speaking and swallowing.  No fevers or chills, positive nausea but no vomiting.  On her initial physical examination the temperature is 99.2, blood pressure 165/80, heart rate 60, respiratory rate 18, oxygen saturation 95% on room air.  She has a wide neck, tender lymphadenopathy in the left submandibular region, no erythema or skin changes. No stridor. Her lungs are clear to  auscultation bilaterally, heart S1-S2 present and rhythmic, abdomen is protuberant nontender, trace lower extremity edema.  Sodium 139, potassium 4.0, chloride 103, bicarb 25, glucose 110, BUN 14, creatinine 0.72, white count 15.0, hemoglobin 12.1, hematocrit 39.5, platelets 241.  INR 2.1.  Soft tissue neck CT with large tonsillar abscess on the left, roughly 2.2 x 2.1 cm in cross-section.  Surrounding left parapharyngeal inflammation, no frank peritonsillar abscess.  Inflammatory changes extend caudally into the left paraglottic space.  Positive effacement of the supraglottic airway.  Retropharyngeal effusion versus abscess 13 x 7 mm cross-section extending over the last to 3 vertebral body segments.  Patient will be admitted to the hospital with working diagnosis of large left tonsillar abscess.   1.  Large left tonsillar abscess.  Patient will be admitted to the medical ward, she will placed on antibiotic therapy with IV Unasyn, will follow-up white cell count, cultures and temperature curve.  Pain control with ibuprofen and morphine.  Oximetry monitoring and supplemental 02 per [place].  ENT has been consulted, Dr. Constance Holster will follow-up patient during this hospitalization.  2.  Type 2 diabetes mellitus.  Patient follows a diet control, her admission glucose is 110.  Patient will be placed on insulin sliding scale for glucose coverage and monitoring.  Diabetic diet.  3.  Hypertension.  Patient will continue losartan for blood pressure control. She will be placed on isotonic saline, hold furosemide.  4.  Paroxysmal atrial fibrillation.  Continue diltiazem for rate control, will hold on warfarin for now in case patient need invasive intervention.  Her admission INR is therapeutic at 2.1.  5.  Hypothyroidism.  Continue  levothyroxine.  6.  Dyslipidemia.  Continue atorvastatin.    DVT prophylaxis:  Elevated INR Code Status: full  Family Communication: no family at the bedside   Disposition Plan:  med-surg  Consults called: ENT Dr. Constance Holster   Admission status: Inpatient.     Mauricio Gerome Apley MD [facility]   08/13/2018, 3:42 PM

## 2018-08-13 NOTE — Telephone Encounter (Signed)
Patient called and stated she was having difficulty breathing this am and felt like she was choking on her mucus and earlier felt like she was going to die. Patient didn't feel able to drive. Advised the patient to call EMS for transport to hospital for evaluation and treatment. Patient verbalized understanding.

## 2018-08-14 DIAGNOSIS — I480 Paroxysmal atrial fibrillation: Secondary | ICD-10-CM

## 2018-08-14 DIAGNOSIS — J36 Peritonsillar abscess: Principal | ICD-10-CM

## 2018-08-14 DIAGNOSIS — E118 Type 2 diabetes mellitus with unspecified complications: Secondary | ICD-10-CM

## 2018-08-14 DIAGNOSIS — E038 Other specified hypothyroidism: Secondary | ICD-10-CM

## 2018-08-14 LAB — BASIC METABOLIC PANEL
Anion gap: 12 (ref 5–15)
BUN: 9 mg/dL (ref 8–23)
CALCIUM: 9 mg/dL (ref 8.9–10.3)
CO2: 25 mmol/L (ref 22–32)
Chloride: 102 mmol/L (ref 98–111)
Creatinine, Ser: 0.77 mg/dL (ref 0.44–1.00)
GFR calc Af Amer: >60 mL/min (ref >60)
GFR calc non Af Amer: >60 mL/min (ref >60)
Glucose, Bld: 115 mg/dL — ABNORMAL HIGH (ref 70–99)
Potassium: 3.7 mmol/L (ref 3.5–5.1)
SODIUM: 139 mmol/L (ref 135–145)

## 2018-08-14 LAB — CBC
HCT: 40.4 % (ref 36.0–46.0)
Hemoglobin: 13 g/dL (ref 12.0–15.0)
MCH: 28.1 pg (ref 26.0–34.0)
MCHC: 32.2 g/dL (ref 30.0–36.0)
MCV: 87.3 fL (ref 80.0–100.0)
Platelets: 257 10*3/uL (ref 150–400)
RBC: 4.63 MIL/uL (ref 3.87–5.11)
RDW: 15.1 % (ref 11.5–15.5)
WBC: 15.9 10*3/uL — ABNORMAL HIGH (ref 4.0–10.5)
nRBC: 0 % (ref 0.0–0.2)

## 2018-08-14 LAB — GLUCOSE, CAPILLARY
Glucose-Capillary: 105 mg/dL — ABNORMAL HIGH (ref 70–99)
Glucose-Capillary: 205 mg/dL — ABNORMAL HIGH (ref 70–99)
Glucose-Capillary: 205 mg/dL — ABNORMAL HIGH (ref 70–99)
Glucose-Capillary: 145 mg/dL — ABNORMAL HIGH (ref 70–99)

## 2018-08-14 MED ORDER — LOSARTAN POTASSIUM 50 MG PO TABS
50.0000 mg | ORAL_TABLET | Freq: Every day | ORAL | Status: DC
  Administered 2018-08-15 – 2018-08-18 (×4): 50 mg via ORAL
  Filled 2018-08-14 (×4): qty 1

## 2018-08-14 MED ORDER — WARFARIN - PHARMACIST DOSING INPATIENT
Freq: Every day | Status: DC
  Administered 2018-08-14: 18:00:00

## 2018-08-14 MED ORDER — PHENOL 1.4 % MT LIQD
1.0000 | OROMUCOSAL | Status: DC | PRN
  Filled 2018-08-14: qty 177

## 2018-08-14 MED ORDER — WARFARIN SODIUM 6 MG PO TABS
6.0000 mg | ORAL_TABLET | Freq: Once | ORAL | Status: AC
  Administered 2018-08-14: 6 mg via ORAL
  Filled 2018-08-14: qty 1

## 2018-08-14 NOTE — Progress Notes (Signed)
PROGRESS NOTE        PATIENT DETAILS Name: [name] Age: 78 y.o. Sex: female Date of Birth: [birth year] Admit Date: 08/13/2018 Admitting Physician Mauricio Gerome Apley, MD UXN:[id number], Grace Bushy, MD  Brief Narrative: Patient is a 78 y.o. female history of DM-2, hypertension, dyslipidemia, atrial fibrillation on Coumadin-presented with sore throat, left ear pain-found to have left sided tonsillitis-possibly early intratonsillar abscess.  See below for further details  Subjective: Feels somewhat better than yesterday-her voice is come back-she still has difficulty-mostly pain on swallowing.  Assessment/Plan: Left-sided tonsillitis with early intratonsillar abscess: Improved after initiating IV Unasyn.  Although improved-still with difficulty swallowing-and significant leukocytosis.  Thankfully no airway issues.  Discussed with ENT-Dr. Rosen-recommendations are to continue with IV Unasyn-if patient continues improvement-then possibly discharge home over the next few days on oral antimicrobial therapy.  If patient does not improve-we will reconsult ENT.  We will start clear liquids and slowly advance.  DM-2: CBG stable with SSI.  Hypertension: Uncontrolled-increase losartan to 50 mg, continue Cardizem-decrease IV fluid.  Follow and optimize accordingly.  PAF: Rate controlled with Cardizem-resume Coumadin per pharmacy.  Hypothyroidism: Continue Synthroid  Depression: Stable-continue Lexapro  Asthma: Lungs clear-stable-continue bronchodilators.  DVT Prophylaxis: Full dose anticoagulation with Coumadin  Code Status: Full code  Family Communication: Niece at bedside  Disposition Plan: Remain inpatient  Antimicrobial agents: Anti-infectives (From admission, onward)   Start     Dose/Rate Route Frequency Ordered Stop   08/13/18 1715  Ampicillin-Sulbactam (UNASYN) 3 g in sodium chloride 0.9 % 100 mL IVPB     3 g 200 mL/hr over 30 Minutes Intravenous  Every 6 hours 08/13/18 1705     08/13/18 1415  clindamycin (CLEOCIN) IVPB 600 mg     600 mg 100 mL/hr over 30 Minutes Intravenous  Once 08/13/18 1407 08/13/18 1503      Procedures: None  CONSULTS:  ENT  Time spent: 25- minutes-Greater than 50% of this time was spent in counseling, explanation of diagnosis, planning of further management, and coordination of care.  MEDICATIONS: Scheduled Meds: . atorvastatin  40 mg Oral Daily  . budesonide (PULMICORT) nebulizer solution  0.5 mg Nebulization BID  . diltiazem  180 mg Oral Daily  . escitalopram  20 mg Oral Daily  . ibuprofen  400 mg Oral Q6H  . insulin aspart  0-9 Units Subcutaneous TID WC  . levothyroxine  75 mcg Oral Daily  . losartan  25 mg Oral Daily  . pantoprazole  40 mg Oral BID AC   Continuous Infusions: . sodium chloride 75 mL/hr at 08/13/18 2037  . ampicillin-sulbactam (UNASYN) IV 3 g (08/14/18 0540)   PRN Meds:.acetaminophen **OR** acetaminophen, albuterol, diphenoxylate-atropine, meclizine, morphine injection, nitroGLYCERIN, ondansetron **OR** ondansetron (ZOFRAN) IV, phenol   PHYSICAL EXAM: Vital signs: Vitals:   08/13/18 1005 08/13/18 1832 08/13/18 2157 08/14/18 0901  BP:  (!) 177/79    Pulse:  61    Resp:  18    Temp:  99.3 F (37.4 C)    TempSrc:  Oral    SpO2:  94% 98% 93%  Weight: 90.7 kg     Height: 5\' 1"  (1.549 m)      Filed Weights   08/13/18 1005  Weight: 90.7 kg   Body mass index is 37.79 kg/m.   General appearance :Awake, alert, not in any distress. S HEENT: Atraumatic and  Normocephalic Neck: supple, no JVD. No cervical lymphadenopathy. No thyromegaly Resp:Good air entry bilaterally, no added sounds  CVS: S1 S2 regular, no murmurs.  GI: Bowel sounds present, Non tender and not distended with no gaurding, rigidity or rebound.No organomegaly Extremities: B/L Lower Ext shows no edema, both legs are warm to touch Neurology:  speech clear,Non focal, sensation is grossly  intact. Psychiatric: Normal judgment and insight. Alert and oriented x 3. Normal mood. Musculoskeletal:No digital cyanosis Skin:No Rash, warm and dry Wounds:N/A  I have personally reviewed following labs and imaging studies  LABORATORY DATA: CBC: Recent Labs  Lab 08/13/18 1205 08/14/18 0334  WBC 15.0* 15.9*  NEUTROABS 13.1*  --   HGB 12.1 13.0  HCT 39.5 40.4  MCV 89.2 87.3  PLT 241 220    Basic Metabolic Panel: Recent Labs  Lab 08/13/18 1205 08/14/18 0334  NA 139 139  K 4.0 3.7  CL 103 102  CO2 25 25  GLUCOSE 110* 115*  BUN 14 9  CREATININE 0.72 0.77  CALCIUM 9.0 9.0    GFR: Estimated Creatinine Clearance: 58.5 mL/min (by C-G formula based on SCr of 0.77 mg/dL).  Liver Function Tests: No results for input(s): AST, ALT, ALKPHOS, BILITOT, PROT, ALBUMIN in the last 168 hours. No results for input(s): LIPASE, AMYLASE in the last 168 hours. No results for input(s): AMMONIA in the last 168 hours.  Coagulation Profile: Recent Labs  Lab 08/10/18 1322 08/13/18 1205  INR 2.6 2.13    Cardiac Enzymes: No results for input(s): CKTOTAL, CKMB, CKMBINDEX, TROPONINI in the last 168 hours.  BNP (last 3 results) No results for input(s): PROBNP in the last 8760 hours.  HbA1C: No results for input(s): HGBA1C in the last 72 hours.  CBG: Recent Labs  Lab 08/13/18 1717 08/13/18 2350 08/14/18 0738  GLUCAP 85 78 105*    Lipid Profile: No results for input(s): CHOL, HDL, LDLCALC, TRIG, CHOLHDL, LDLDIRECT in the last 72 hours.  Thyroid Function Tests: No results for input(s): TSH, T4TOTAL, FREET4, T3FREE, THYROIDAB in the last 72 hours.  Anemia Panel: No results for input(s): VITAMINB12, FOLATE, FERRITIN, TIBC, IRON, RETICCTPCT in the last 72 hours.  Urine analysis:    Component Value Date/Time   COLORURINE YELLOW 04/24/2018 1020   APPEARANCEUR HAZY (A) 04/24/2018 1020   LABSPEC 1.029 04/24/2018 1020   PHURINE 6.0 04/24/2018 1020   GLUCOSEU NEGATIVE  04/24/2018 1020   HGBUR SMALL (A) 04/24/2018 1020   BILIRUBINUR NEGATIVE 04/24/2018 1020   KETONESUR NEGATIVE 04/24/2018 1020   PROTEINUR NEGATIVE 04/24/2018 1020   UROBILINOGEN 0.2 05/08/2014 2240   NITRITE NEGATIVE 04/24/2018 1020   LEUKOCYTESUR LARGE (A) 04/24/2018 1020    Sepsis Labs: Lactic Acid, Venous    Component Value Date/Time   LATICACIDVEN 1.0 05/10/2016 2125    MICROBIOLOGY: No results found for this or any previous visit (from the past 240 hour(s)).  RADIOLOGY STUDIES/RESULTS: Ct Soft Tissue Neck W Contrast  Result Date: 08/13/2018 CLINICAL DATA:  Throat pain and difficulty swallowing. EXAM: CT NECK WITH CONTRAST TECHNIQUE: Multidetector CT imaging of the neck was performed using the standard protocol following the bolus administration of intravenous contrast. CONTRAST:  40mL OMNIPAQUE IOHEXOL 300 MG/ML  SOLN COMPARISON:  None. FINDINGS: Pharynx and larynx: There is a large tonsillar abscess on the LEFT, roughly 2.2 x 2.1 cm cross-section. There is surrounding LEFT parapharyngeal inflammation, but no frank peritonsillar abscess. There is also abnormal retropharyngeal fluid, possible retropharyngeal abscess, 13 x 7 mm cross-section extending over at least 2-3  vertebral body segments. Abnormal inflammatory change extends into the paraglottic region on the LEFT. This results in significant glottic narrowing. Correlate clinically for symptoms of stridor or airway compromise. Urgent ENT consultation is recommended. Salivary glands: No inflammation, mass, or stone. Thyroid: Normal. Lymph nodes: Reactive cervical lymph nodes. Vascular: Patent. Atherosclerosis. Limited intracranial: Negative. Visualized orbits: Negative. Mastoids and visualized paranasal sinuses: No definite layering sinus fluid. Skeleton: Spondylosis. Upper chest: No mass or pneumothorax. No subglottic narrowing. Other: None. IMPRESSION: 1. Large tonsillar abscess on the LEFT, roughly 2.2 x 2.1 cm cross-section.  Surrounding LEFT parapharyngeal inflammation, but no frank peritonsillar abscess. 2. Inflammatory change extends caudally into the LEFT paraglottic space, and there is effacement of the supraglottic airway. Urgent ENT consultation is warranted. 3. Retropharyngeal effusion versus abscess, 13 x 7 mm cross-section extending over at least 2-3 vertebral body segments. 4. These results were called by telephone at the time of interpretation on 08/13/2018 at 2:07 pm to Dr. Roderic Palau , who verbally acknowledged these results. Electronically Signed   By: Staci Righter M.D.   On: 08/13/2018 14:09     LOS: 1 day   Oren Binet, MD  [facility]  If 7PM-7AM, please contact night-coverage  Please page via [email or web address]  Go to [email or web address] and use [place]'s universal password to access. If you do not have the password, please contact the hospital operator.  Locate the [facility] provider you are looking for under [facility] and page to a number that you can be directly reached. If you still have difficulty reaching the provider, please page the [facility] (Director on Call) for the Hospitalists listed on amion for assistance.  08/14/2018, 10:48 AM

## 2018-08-14 NOTE — Progress Notes (Signed)
ANTICOAGULATION CONSULT NOTE - Initial Consult  Pharmacy Consult for Coumadin Indication: atrial fibrillation  Allergies  Allergen Reactions  . Cefzil [Cefprozil] Nausea And Vomiting  . Demerol Nausea And Vomiting    Patient Measurements: Height: 5\' 1"  (154.9 cm) Weight: 200 lb (90.7 kg) IBW/kg (Calculated) : 47.8 Vital Signs:    Labs: Recent Labs    08/13/18 1205 08/14/18 0334  HGB 12.1 13.0  HCT 39.5 40.4  PLT 241 257  LABPROT 23.6*  --   INR 2.13  --   CREATININE 0.72 0.77    Estimated Creatinine Clearance: 58.5 mL/min (by C-G formula based on SCr of 0.77 mg/dL).   Medical History: Past Medical History:  Diagnosis Date  . Allergy   . Allergy history unknown   . Arthritis   . ASCVD (arteriosclerotic cardiovascular disease)    stent to mid and proximal left anterior descending in 06/2002;drug eluting stent placed in the second diagnol in 08/2003 after  A  non-st elevation myocardial infarction   . Asthma   . Atrial fibrillation ([place])   . Chronic anticoagulation   . Chronic nausea   . Chronic pain of left knee   . Coronary artery disease   . Current chronic use of systemic steroids 05/11/2016  . Depression   . Diabetes mellitus    no insulin  . Diarrhea    acute  . Diverticulitis of intestine   . FH: colonic polyps    adenomataous  . GERD (gastroesophageal reflux disease)   . History of kidney stones   . Hyperlipidemia    pulmonary embolism 2000 and 09/2008  . Hypertension   . Hypothyroidism   . Insomnia   . Irritable bowel syndrome   . Myocardial infarction ([place])   . Paroxysmal atrial fibrillation ([facility])    normal LV function; episodes occurred in 205 and 09/2007  . Pedal edema   . Peripheral edema   . PONV (postoperative nausea and vomiting)   . Pulmonary embolism ([place])    2000/09/2008  . Rectal bleeding   . Sleep apnea   . Thyroid disease    hypothyroidism  . Tobacco user    stopped   . Venous stasis    Assessment: CC/HPI: tonsillar  abscess, Left ear and throat pain.  PMH: type 2 diabetes mellitus, hypertension, dyslipidemia, atrial fibrillation and hypothyroidism   Significant events: Amoxil PTA  Anticoag: Afib. INR 2.13. CBC WNL. CHADS2VASC=6. LD 2/19. Missed dose 2/20.  - PTA Warfarin: 5mg  MWFSat, 2.5mg  TTSun with admit INR 2.13.  Goal of Therapy:  Heparin level 0.3-0.7 units/ml Monitor platelets by anticoagulation protocol: Yes   Plan:  Coumadin 6mg  po x 1 tonight (missed dose 2/20).  Daily INR  Precilla Purnell S. Alford Highland, PharmD, BCPS Clinical Staff Pharmacist Eilene Ghazi [place] 08/14/2018,10:55 AM

## 2018-08-14 NOTE — Discharge Instructions (Signed)

## 2018-08-14 NOTE — Consult Note (Signed)
Reason for Consult: Tonsil abscess Referring Physician: Jonetta Osgood, MD  [name] is an 78 y.o. female.  HPI: Admitted yesterday with a severe tonsillitis and evidence of a tonsillar abscess on CT imaging.  History of heart disease and on anticoagulation therapy.  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  Since admission and starting IV antibiotic she feels much better today.  Past Medical History:  Diagnosis Date  . Allergy   . Allergy history unknown   . Arthritis   . ASCVD (arteriosclerotic cardiovascular disease)    stent to mid and proximal left anterior descending in 06/2002;drug eluting stent placed in the second diagnol in 08/2003 after  A  non-st elevation myocardial infarction   . Asthma   . Atrial fibrillation ([place])   . Chronic anticoagulation   . Chronic nausea   . Chronic pain of left knee   . Coronary artery disease   . Current chronic use of systemic steroids 05/11/2016  . Depression   . Diabetes mellitus    no insulin  . Diarrhea    acute  . Diverticulitis of intestine   . FH: colonic polyps    adenomataous  . GERD (gastroesophageal reflux disease)   . History of kidney stones   . Hyperlipidemia    pulmonary embolism 2000 and 09/2008  . Hypertension   . Hypothyroidism   . Insomnia   . Irritable bowel syndrome   . Myocardial infarction ([place])   . Paroxysmal atrial fibrillation ([facility])    normal LV function; episodes occurred in 205 and 09/2007  . Pedal edema   . Peripheral edema   . PONV (postoperative nausea and vomiting)   . Pulmonary embolism ([place])    2000/09/2008  . Rectal bleeding   . Sleep apnea   . Thyroid disease    hypothyroidism  . Tobacco user    stopped   . Venous stasis     Past Surgical History:  Procedure Laterality Date  . ABDOMINAL HYSTERECTOMY    . APPENDECTOMY    . BALLOON DILATION N/A 11/11/2013   Procedure: BALLOON DILATION;  Surgeon: Rogene Houston, MD;  [facility];   Service: Endoscopy;  Laterality: N/A;  . BREAST REDUCTION SURGERY    . BREAST SURGERY    . CHOLECYSTECTOMY    . CLEFT PALATE REPAIR     4 surgeries  . COLONOSCOPY  2011   negative  . COLONOSCOPY N/A 05/10/2014   Procedure: COLONOSCOPY;  Surgeon: Rogene Houston, MD;  [facility];  Service: Endoscopy;  Laterality: N/A;  . ESOPHAGEAL DILATION N/A 03/08/2016   Procedure: ESOPHAGEAL DILATION;  Surgeon: Rogene Houston, MD;  [facility];  Service: Endoscopy;  Laterality: N/A;  . ESOPHAGOGASTRODUODENOSCOPY N/A 11/11/2013   Procedure: ESOPHAGOGASTRODUODENOSCOPY (EGD);  Surgeon: Rogene Houston, MD;  [facility];  Service: Endoscopy;  Laterality: N/A;  200-moved to 100 Ann notified pt  . ESOPHAGOGASTRODUODENOSCOPY N/A 05/09/2014   Procedure: ESOPHAGOGASTRODUODENOSCOPY (EGD);  Surgeon: Rogene Houston, MD;  [facility];  Service: Endoscopy;  Laterality: N/A;  . ESOPHAGOGASTRODUODENOSCOPY N/A 03/08/2016   Procedure: ESOPHAGOGASTRODUODENOSCOPY (EGD);  Surgeon: Rogene Houston, MD;  [facility];  Service: Endoscopy;  Laterality: N/A;  2:20  . HEEL SPUR SURGERY    . KNEE SURGERY    . MALONEY DILATION N/A 11/11/2013   Procedure: Venia Minks DILATION;  Surgeon: Rogene Houston, MD;  [facility];  Service: Endoscopy;  Laterality: N/A;  . OOPHORECTOMY    . SAVORY DILATION N/A 11/11/2013   Procedure: SAVORY DILATION;  Surgeon: Rogene Houston, MD;  [facility];  Service: Endoscopy;  Laterality: N/A;  . TONSILLECTOMY      Family History  Problem Relation Age of Onset  . Heart attack Mother   . Diabetes Mother   . Hyperlipidemia Mother   . Heart attack Father   . Lung cancer Sister   . Lymphoma Brother   . Colon cancer Neg Hx     Social History:  reports that she has quit smoking. Her smoking use included cigarettes. She started smoking about 31 years ago. She has a 60.00 pack-year smoking history. She has never used  smokeless tobacco. She reports that she does not drink alcohol or use drugs.  Allergies:  Allergies  Allergen Reactions  . Cefzil [Cefprozil] Nausea And Vomiting  . Demerol Nausea And Vomiting    Medications: Reviewed  Results for orders placed or performed during the hospital encounter of 08/13/18 (from the past 48 hour(s))  CBC with Differential     Status: Abnormal   Collection Time: 08/13/18 12:05 PM  Result Value Ref Range   WBC 15.0 (H) 4.0 - 10.5 K/uL   RBC 4.43 3.87 - 5.11 MIL/uL   Hemoglobin 12.1 12.0 - 15.0 g/dL   HCT 39.5 36.0 - 46.0 %   MCV 89.2 80.0 - 100.0 fL   MCH 27.3 26.0 - 34.0 pg   MCHC 30.6 30.0 - 36.0 g/dL   RDW 15.1 11.5 - 15.5 %   Platelets 241 150 - 400 K/uL   nRBC 0.0 0.0 - 0.2 %   Neutrophils Relative % 88 %   Neutro Abs 13.1 (H) 1.7 - 7.7 K/uL   Lymphocytes Relative 6 %   Lymphs Abs 0.9 0.7 - 4.0 K/uL   Monocytes Relative 6 %   Monocytes Absolute 0.9 0.1 - 1.0 K/uL   Eosinophils Relative 0 %   Eosinophils Absolute 0.0 0.0 - 0.5 K/uL   Basophils Relative 0 %   Basophils Absolute 0.0 0.0 - 0.1 K/uL   Immature Granulocytes 0 %   Abs Immature Granulocytes 0.05 0.00 - 0.07 K/uL    Comment: Performed at [facility], [address] [place], [place] [address]  Basic metabolic panel     Status: Abnormal   Collection Time: 08/13/18 12:05 PM  Result Value Ref Range   Sodium 139 135 - 145 mmol/L   Potassium 4.0 3.5 - 5.1 mmol/L   Chloride 103 98 - 111 mmol/L   CO2 25 22 - 32 mmol/L   Glucose, Bld 110 (H) 70 - 99 mg/dL   BUN 14 8 - 23 mg/dL   Creatinine, Ser 0.72 0.44 - 1.00 mg/dL   Calcium 9.0 8.9 - 10.3 mg/dL   GFR calc non Af Amer >60 >60 mL/min   GFR calc Af Amer >60 >60 mL/min   Anion gap 11 5 - 15    Comment: Performed at [facility], [address], [place], [place] [address]  Protime-INR     Status: Abnormal   Collection Time: 08/13/18 12:05 PM  Result Value Ref Range   Prothrombin Time 23.6 (H) 11.4 - 15.2 seconds   INR 2.13      Comment: Performed at [facility], [address], [place], [place] [address]  CBG monitoring, ED     Status: None   Collection Time: 08/13/18  5:17 PM  Result Value  Ref Range   Glucose-Capillary 85 70 - 99 mg/dL  Glucose, capillary     Status: None   Collection Time: 08/13/18 11:50 PM  Result Value Ref Range   Glucose-Capillary 78 70 - 99 mg/dL  Basic metabolic panel     Status: Abnormal   Collection Time: 08/14/18  3:34 AM  Result Value Ref Range   Sodium 139 135 - 145 mmol/L   Potassium 3.7 3.5 - 5.1 mmol/L   Chloride 102 98 - 111 mmol/L   CO2 25 22 - 32 mmol/L   Glucose, Bld 115 (H) 70 - 99 mg/dL   BUN 9 8 - 23 mg/dL   Creatinine, Ser 0.77 0.44 - 1.00 mg/dL   Calcium 9.0 8.9 - 10.3 mg/dL   GFR calc non Af Amer >60 >60 mL/min   GFR calc Af Amer >60 >60 mL/min   Anion gap 12 5 - 15    Comment: Performed at [facility], [place] [address], [place], [place] [address]  CBC     Status: Abnormal   Collection Time: 08/14/18  3:34 AM  Result Value Ref Range   WBC 15.9 (H) 4.0 - 10.5 K/uL   RBC 4.63 3.87 - 5.11 MIL/uL   Hemoglobin 13.0 12.0 - 15.0 g/dL   HCT 40.4 36.0 - 46.0 %   MCV 87.3 80.0 - 100.0 fL   MCH 28.1 26.0 - 34.0 pg   MCHC 32.2 30.0 - 36.0 g/dL   RDW 15.1 11.5 - 15.5 %   Platelets 257 150 - 400 K/uL   nRBC 0.0 0.0 - 0.2 %    Comment: Performed at [facility], [place] [address] [place], [place] [address]  Glucose, capillary     Status: Abnormal   Collection Time: 08/14/18  7:38 AM  Result Value Ref Range   Glucose-Capillary 105 (H) 70 - 99 mg/dL    Ct Soft Tissue Neck W Contrast  Result Date: 08/13/2018 CLINICAL DATA:  Throat pain and difficulty swallowing. EXAM: CT NECK WITH CONTRAST TECHNIQUE: Multidetector CT imaging of the neck was performed using the standard protocol following the bolus administration of intravenous contrast. CONTRAST:  40mL OMNIPAQUE IOHEXOL 300 MG/ML  SOLN COMPARISON:  None. FINDINGS: Pharynx and larynx: There is a large tonsillar  abscess on the LEFT, roughly 2.2 x 2.1 cm cross-section. There is surrounding LEFT parapharyngeal inflammation, but no frank peritonsillar abscess. There is also abnormal retropharyngeal fluid, possible retropharyngeal abscess, 13 x 7 mm cross-section extending over at least 2-3 vertebral body segments. Abnormal inflammatory change extends into the paraglottic region on the LEFT. This results in significant glottic narrowing. Correlate clinically for symptoms of stridor or airway compromise. Urgent ENT consultation is recommended. Salivary glands: No inflammation, mass, or stone. Thyroid: Normal. Lymph nodes: Reactive cervical lymph nodes. Vascular: Patent. Atherosclerosis. Limited intracranial: Negative. Visualized orbits: Negative. Mastoids and visualized paranasal sinuses: No definite layering sinus fluid. Skeleton: Spondylosis. Upper chest: No mass or pneumothorax. No subglottic narrowing. Other: None. IMPRESSION: 1. Large tonsillar abscess on the LEFT, roughly 2.2 x 2.1 cm cross-section. Surrounding LEFT parapharyngeal inflammation, but no frank peritonsillar abscess. 2. Inflammatory change extends caudally into the LEFT paraglottic space, and there is effacement of the supraglottic airway. Urgent ENT consultation is warranted. 3. Retropharyngeal effusion versus abscess, 13 x 7 mm cross-section extending over at least 2-3 vertebral body segments. 4. These results were called by telephone at the time of interpretation on 08/13/2018 at 2:07 pm to Dr. Roderic Palau , who verbally acknowledged  these results. Electronically Signed   By: Staci Righter M.D.   On: 08/13/2018 14:09    QZE:[id number] except as listed in admit H&P  Blood pressure (!) 177/79, pulse 61, temperature 99.3 F (37.4 C), temperature source Oral, resp. rate 18, height 5\' 1"  (1.549 m), weight 90.7 kg, SpO2 93 %.  PHYSICAL EXAM: Overall appearance:  Healthy appearing, in no distress, breathing clearly, no trouble handling secretions.  She does have  a hypernasal voice from her cleft palate. Head:  Normocephalic, atraumatic. Ears: External ears look healthy. Nose: External nose is healthy in appearance. Internal nasal exam free of any lesions or obstruction. Oral Cavity/Pharynx:  There are no mucosal lesions or masses identified.  There is asymmetric swelling and fullness of the left tonsil with slight erythema.  There is no trismus. Larynx/Hypopharynx: Deferred Neuro:  No identifiable neurologic deficits. Neck: No palpable neck masses, or adenopathy.  Studies Reviewed: CT of the neck reviewed.  Procedures: none   Assessment/Plan: Asymmetric tonsillitis, possible early intratonsillar abscess.  No evidence of parapharyngeal or peritonsillar abscess.  Since she is on anticoagulation therapy and responding nicely to the antibiotics recommend continue medical therapy for now.  There is no clinical concerns about airway obstruction at this point.  Contact us if there are additional concerns and if she fails to resolve clinically.  Izora Gala 08/14/2018, 10:23 AM

## 2018-08-14 NOTE — NC FL2 (Signed)
[place] LEVEL OF CARE SCREENING TOOL     IDENTIFICATION  Patient Name: [name] Birthdate: Sep 13, 1940 Sex: female Admission Date (Current Location): 08/13/2018  [place] and [place] Number:  [employer] and Address:  [facility] [place], [place] [address], [place], [place] [address]      Provider Number: [id number]  Attending Physician Name and Address:  Jonetta Osgood, MD  Relative Name and Phone Number:  [name], niece, [phone]    Current Level of Care: Hospital Recommended Level of Care: [place] Prior Approval Number:    Date Approved/Denied:   PASRR Number: [id number]  Discharge Plan: SNF    Current Diagnoses: Patient Active Problem List   Diagnosis Date Noted  . Tonsillar abscess 08/13/2018  . AF (paroxysmal atrial fibrillation) ([place]) 08/13/2018  . Nausea vomiting and diarrhea 05/11/2016  . Obesity 05/11/2016  . 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 Insomnia 04/15/2016  . Hyperkalemia 04/08/2016  . Volume depletion 04/08/2016  . AKI (acute kidney injury) ([place]) 04/08/2016  . Depression 04/07/2016  . Nausea without vomiting 05/09/2014  . Melena 05/08/2014  . UGI bleed 05/08/2014  . Dysphagia, unspecified(787.20) 10/26/2013  . Difficulty walking 10/19/2012  . Weakness of left leg 10/19/2012  . Long term current use of anticoagulant therapy 09/26/2012  . Dysphagia 05/04/2012  . Microcytic anemia 08/10/2011  . Acute respiratory failure ([place]) 08/09/2011  . IBS (irritable bowel syndrome) 08/09/2011  . DOE (dyspnea on exertion) 03/05/2011  . Anemia 03/05/2011  . Peripheral neuropathy 10/02/2010  . 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 DIARRHEA, ACUTE 12/05/2008  . Hypothyroidism 12/02/2008  . Controlled type 2 diabetes mellitus ([place]) 12/02/2008  . Hyperlipidemia LDL goal <70 12/02/2008  . Essential hypertension 12/02/2008  . Coronary  atherosclerosis 12/02/2008  . Chronic atrial fibrillation 12/02/2008  . Asthma 12/02/2008  . GERD 12/02/2008  . TOBACCO USE, QUIT 12/02/2008    Orientation RESPIRATION BLADDER Height & Weight     Self, Time, Situation, Place  Normal Incontinent, External catheter Weight: 90.7 kg Height:  5\' 1"  (154.9 cm)  BEHAVIORAL SYMPTOMS/MOOD NEUROLOGICAL BOWEL NUTRITION STATUS      Continent Diet(Please see DC Summary)  AMBULATORY STATUS COMMUNICATION OF NEEDS Skin   Limited Assist Verbally Normal                       Personal Care Assistance Level of Assistance  Bathing, Feeding, Dressing Bathing Assistance: Limited assistance Feeding assistance: Independent Dressing Assistance: Limited assistance     Functional Limitations Info  Sight, Hearing, Speech Sight Info: Adequate Hearing Info: Adequate Speech Info: Adequate    SPECIAL CARE FACTORS FREQUENCY  PT (By licensed PT), OT (By licensed OT)     PT Frequency: 5x/week OT Frequency: 3x/week            Contractures Contractures Info: Not present    Additional Factors Info  Code Status, Allergies, Psychotropic, Insulin Sliding Scale Code Status Info: Full Allergies Info: Cefzil Cefprozil, Demerol Psychotropic Info: Lexapro Insulin Sliding Scale Info: 3x daily with meals       Current Medications (08/14/2018):  This is the current hospital active medication list Current Facility-Administered Medications  Medication Dose Route Frequency Provider Last Rate Last Dose  . 0.9 %  sodium chloride infusion   Intravenous Continuous Jonetta Osgood, MD 50 mL/hr at 08/14/18 1151    . acetaminophen (TYLENOL) tablet 650 mg  650 mg  Oral Q6H PRN Arrien, Jimmy Picket, MD       Or  . acetaminophen (TYLENOL) suppository 650 mg  650 mg Rectal Q6H PRN Arrien, Jimmy Picket, MD      . albuterol (PROVENTIL) (2.5 MG/3ML) 0.083% nebulizer solution 2.5 mg  2.5 mg Nebulization Q6H PRN Arrien, Jimmy Picket, MD      .  Ampicillin-Sulbactam (UNASYN) 3 g in sodium chloride 0.9 % 100 mL IVPB  3 g Intravenous Q6H Coffee, Garrett, RPH 200 mL/hr at 08/14/18 1055 3 g at 08/14/18 1055  . atorvastatin (LIPITOR) tablet 40 mg  40 mg Oral Daily Arrien, Jimmy Picket, MD   40 mg at 08/14/18 8264  . budesonide (PULMICORT) nebulizer solution 0.5 mg  0.5 mg Nebulization BID Arrien, Jimmy Picket, MD   0.5 mg at 08/14/18 0901  . diltiazem (CARDIZEM CD) 24 hr capsule 180 mg  180 mg Oral Daily Arrien, Jimmy Picket, MD   180 mg at 08/14/18 0908  . diphenoxylate-atropine (LOMOTIL) 2.5-0.025 MG per tablet 1 tablet  1 tablet Oral QID PRN Arrien, Jimmy Picket, MD      . escitalopram [facility]) tablet 20 mg  20 mg Oral Daily Arrien, Jimmy Picket, MD   20 mg at 08/14/18 0907  . ibuprofen (ADVIL,MOTRIN) 100 MG/5ML suspension 400 mg  400 mg Oral Q6H Arrien, Jimmy Picket, MD      . insulin aspart (novoLOG) injection 0-9 Units  0-9 Units Subcutaneous TID WC Arrien, Jimmy Picket, MD   3 Units at 08/14/18 1150  . levothyroxine (SYNTHROID, LEVOTHROID) tablet 75 mcg  75 mcg Oral Daily Tawni Millers, MD   75 mcg at 08/14/18 0908  . [START ON 08/15/2018] losartan (COZAAR) tablet 50 mg  50 mg Oral Daily Ghimire, Henreitta Leber, MD      . meclizine (ANTIVERT) tablet 25 mg  25 mg Oral TID PRN Arrien, Jimmy Picket, MD      . morphine 2 MG/ML injection 1 mg  1 mg Intravenous Q2H PRN Arrien, Jimmy Picket, MD   1 mg at 08/14/18 0907  . nitroGLYCERIN (NITROLINGUAL) 0.4 MG/SPRAY spray 1 spray  1 spray Sublingual Q5 Min x 3 PRN Arrien, Jimmy Picket, MD      . ondansetron [facility]) tablet 4 mg  4 mg Oral Q6H PRN Arrien, Jimmy Picket, MD       Or  . ondansetron [facility]) injection 4 mg  4 mg Intravenous Q6H PRN Arrien, Jimmy Picket, MD      . pantoprazole (PROTONIX) EC tablet 40 mg  40 mg Oral BID AC Arrien, Jimmy Picket, MD   40 mg at 08/14/18 0908  . phenol (CHLORASEPTIC) mouth spray 1 spray  1 spray Mouth/Throat PRN  Ghimire, Henreitta Leber, MD      . warfarin (COUMADIN) tablet 6 mg  6 mg Oral ONCE-1800 [facility] [place], [place]      . Warfarin - Pharmacist Dosing Inpatient   Does not apply q1800 Karren Cobble [facility]         Discharge Medications: Please see discharge summary for a list of discharge medications.  Relevant Imaging Results:  Relevant Lab Results:   Additional Information SSN [place], [place]

## 2018-08-14 NOTE — Clinical Social Work Note (Signed)
Clinical Social Work Assessment  Patient Details  Name: [name] MRN: [id number] Date of Birth: 03/22/1941  Date of referral:  08/14/18               Reason for consult:  Facility Placement                Permission sought to share information with:  Facility Sport and exercise psychologist, Family Supports Permission granted to share information::  Yes, Verbal Permission Granted  Name::     [name]  Agency::  [facility]  Relationship::  Niece  Contact Information:  [phone]  Housing/Transportation Living arrangements for the past 2 months:  Apartment Source of Information:  Engineer, materials, Other (Comment Required) Patient Interpreter Needed:  None Criminal Activity/Legal Involvement Pertinent to Current Situation/Hospitalization:  No - Comment as needed Significant Relationships:  Other Family Members Lives with:  Self Do you feel safe going back to the place where you live?  No Need for family participation in patient care:  No (Coment)  Care giving concerns:  CSW received consult for possible SNF placement at time of discharge. CSW spoke with patient regarding request for SNF placement at time of discharge. Patient reported that she lives alone and has been set up to go to [facility] for rehab. Patient expressed understanding of PT recommendation and is agreeable to SNF placement at time of discharge. CSW to continue to follow and assist with discharge planning needs.   Social Worker assessment / plan:  CSW spoke with patient concerning possibility of rehab at [facility] before returning home.  Employment status:  Retired Nurse, adult PT Recommendations:  Not assessed at this time [place] / Referral to community resources:  [place]  Patient/Family's Response to care:  Patient recognizes need for rehab before returning home and is agreeable to [employer]. [facility] will initiate insurance authorization process once PT has seen patient.    Patient/Family's Understanding of and Emotional Response to Diagnosis, Current Treatment, and Prognosis:  Patient/family is realistic regarding therapy needs and expressed being hopeful for SNF placement. Patient expressed understanding of CSW role and discharge process as well as medical condition. No questions/concerns about plan or treatment.    Emotional Assessment Appearance:  Appears stated age Attitude/Demeanor/Rapport:  Engaged, Gracious Affect (typically observed):  Accepting, Appropriate Orientation:  Oriented to Self, Oriented to Place, Oriented to  Time, Oriented to Situation Alcohol / Substance use:  Not Applicable Psych involvement (Current and /or in the community):  No (Comment)  Discharge Needs  Concerns to be addressed:  Care Coordination Readmission within the last 30 days:  No Current discharge risk:  None Barriers to Discharge:  Continued Medical Work up   [employer], LCSW 08/14/2018, 1:10 PM

## 2018-08-14 NOTE — Evaluation (Signed)
Physical Therapy Evaluation Patient Details Name: [name] MRN: [id number] DOB: Apr 01, 1941 Today's Date: 08/14/2018   History of Present Illness  78yo female presenting with throat pain of 10 days in duration, now exacerbated to the point that she cannot speak and has difficulty swallowing. Diagnosed with L tonsillar abscess, admitted for IV antibiotics and medical management. PMH asthma, PAF, on chronic anticoagulants, knee pain, DM, hx PE, hx MI, hx heel and knee surgery   Clinical Impression   Patient received in bed, initially resistant to participating in PT but then able to be convinced to participate in modified session. Able to complete initial bed mobility with Min guard, became dizzy at EOB however this improved with extended time in upright sitting position. Then required ModA to come to full stand and began to experience severe dizziness in standing, very unsteady on feed even with RW. Dynamap not available to assess BP, however feel that dizziness may possibly be orthostatic in nature. She was able to take lateral side steps along side of bed, and was returned to supine with MinA. She was left in bed with all needs met, bed alarm active, and RN present and attending. She will continue to benefit from skilled PT services in the acute setting, recommend ST-SNF and 24/7 assist/S moving forward.     Follow Up Recommendations SNF;Supervision/Assistance - 24 hour    Equipment Recommendations  Other (comment)(defer to next venue )    Recommendations for Other Services       Precautions / Restrictions Precautions Precautions: Fall;Other (comment) Precaution Comments: watch vitals  Restrictions Weight Bearing Restrictions: No      Mobility  Bed Mobility Overal bed mobility: Needs Assistance Bed Mobility: Supine to Sit;Sit to Supine     Supine to sit: Min guard Sit to supine: Min assist   General bed mobility comments: min guard to come sit at EOB but with increasing  dizziness when upright; MinA for LE management to return to bed   Transfers Overall transfer level: Needs assistance Equipment used: Rolling walker (2 wheeled) Transfers: Sit to/from Stand Sit to Stand: Mod assist         General transfer comment: ModA to boost to full standing position, became extremely dizzy and very unsteady in standing even with RW   Ambulation/Gait             General Gait Details: deferred due to dizziness/safety concerns but able to take lateral steps up EOB with MinA for RW management   Stairs            Wheelchair Mobility    Modified Rankin (Stroke Patients Only)       Balance Overall balance assessment: Needs assistance Sitting-balance support: Bilateral upper extremity supported;Feet supported Sitting balance-Leahy Scale: Good     Standing balance support: Bilateral upper extremity supported;During functional activity Standing balance-Leahy Scale: Poor                               Pertinent Vitals/Pain Pain Assessment: Faces Faces Pain Scale: Hurts little more Pain Location: throat  Pain Descriptors / Indicators: Aching;Sore Pain Intervention(s): Limited activity within patient's tolerance;Monitored during session    [place] expects to be discharged to:: Private residence Living Arrangements: Alone Available Help at Discharge: Friend(s);Available PRN/intermittently Type of Home: Apartment Home Access: Level entry     Home Layout: One level Home Equipment: Walker - 4 wheels;Shower seat      Prior  Function Level of Independence: Independent               Hand Dominance   Dominant Hand: Right    Extremity/Trunk Assessment   Upper Extremity Assessment Upper Extremity Assessment: Generalized weakness    Lower Extremity Assessment Lower Extremity Assessment: Generalized weakness    Cervical / Trunk Assessment Cervical / Trunk Assessment: Kyphotic  Communication    Communication: No difficulties  Cognition Arousal/Alertness: Awake/alert Behavior During Therapy: Flat affect;Impulsive Overall Cognitive Status: Impaired/Different from baseline Area of Impairment: Orientation;Attention;Memory;Following commands;Safety/judgement;Awareness;Problem solving                 Orientation Level: Person;Place;Time;Situation Current Attention Level: Selective Memory: Decreased short-term memory Following Commands: Follows one step commands consistently;Follows one step commands with increased time;Follows multi-step commands inconsistently Safety/Judgement: Decreased awareness of safety Awareness: Intellectual Problem Solving: Slow processing;Requires verbal cues        General Comments      Exercises     Assessment/Plan    PT Assessment Patient needs continued PT services  PT Problem List Decreased strength;Decreased mobility;Decreased safety awareness;Decreased coordination;Obesity;Decreased activity tolerance;Decreased balance;Decreased knowledge of use of DME       PT Treatment Interventions DME instruction;Therapeutic activities;Cognitive remediation;Gait training;Therapeutic exercise;Patient/family education;Stair training;Balance training;Wheelchair mobility training;Functional mobility training;Neuromuscular re-education    PT Goals (Current goals can be found in the Care Plan section)  Acute Rehab PT Goals Patient Stated Goal: get stronger before going home  PT Goal Formulation: With patient Time For Goal Achievement: 08/28/18 Potential to Achieve Goals: Fair    Frequency Min 2X/week   Barriers to discharge        Co-evaluation               AM-PAC PT "6 Clicks" Mobility  Outcome Measure Help needed turning from your back to your side while in a flat bed without using bedrails?: None Help needed moving from lying on your back to sitting on the side of a flat bed without using bedrails?: None Help needed moving to and  from a bed to a chair (including a wheelchair)?: A Little Help needed standing up from a chair using your arms (e.g., wheelchair or bedside chair)?: A Lot Help needed to walk in hospital room?: A Little Help needed climbing 3-5 steps with a railing? : A Lot 6 Click Score: 18    End of Session   Activity Tolerance: Treatment limited secondary to medical complications (Comment)(limited by severe dizziness in standing ) Patient left: in bed;with bed alarm set;with call bell/phone within reach;with nursing/sitter in room Nurse Communication: Mobility status;Other (comment)(severe dizziness in standing, possibly orthostatic in nature ) PT Visit Diagnosis: Unsteadiness on feet (R26.81);Muscle weakness (generalized) (M62.81)    Time: 3300-7622 PT Time Calculation (min) (ACUTE ONLY): 18 min   Charges:   PT Evaluation $PT Eval Low Complexity: 1 Low          Deniece Ree PT, DPT, CBIS  Supplemental Physical Therapist [place]    Pager [phone] Acute Rehab Office [phone]

## 2018-08-14 NOTE — Progress Notes (Signed)
Patient had one episode of diarrhea. Offered Lomotil. Pt refusing at this time.

## 2018-08-14 NOTE — Progress Notes (Signed)
PT evaluation complete, formal documentation to follow. Per evaluation findings, PT currently recommending SNF due to severe weakness and impaired balance, poor functional activity tolerance. Defer to next venue for equipment recommendations. Page at number below or message in [place] with questions.   Deniece Ree PT, DPT, CBIS  Supplemental Physical Therapist [facility]    Pager [phone] Acute Rehab Office [phone]

## 2018-08-14 NOTE — Progress Notes (Signed)
Informed Dr. Sloan Leiter that patient states she takes 4 lomotil at a time for her chronic Diarrhea and only one is ordered PRN. MD to review chart.

## 2018-08-15 LAB — CBC
HEMATOCRIT: 38 % (ref 36.0–46.0)
Hemoglobin: 12 g/dL (ref 12.0–15.0)
MCH: 27.7 pg (ref 26.0–34.0)
MCHC: 31.6 g/dL (ref 30.0–36.0)
MCV: 87.8 fL (ref 80.0–100.0)
Platelets: 290 10*3/uL (ref 150–400)
RBC: 4.33 MIL/uL (ref 3.87–5.11)
RDW: 15.1 % (ref 11.5–15.5)
WBC: 10.5 10*3/uL (ref 4.0–10.5)
nRBC: 0 % (ref 0.0–0.2)

## 2018-08-15 LAB — GLUCOSE, CAPILLARY
Glucose-Capillary: 129 mg/dL — ABNORMAL HIGH (ref 70–99)
Glucose-Capillary: 126 mg/dL — ABNORMAL HIGH (ref 70–99)
Glucose-Capillary: 117 mg/dL — ABNORMAL HIGH (ref 70–99)
Glucose-Capillary: 108 mg/dL — ABNORMAL HIGH (ref 70–99)

## 2018-08-15 LAB — PROTIME-INR
INR: 2.95
Prothrombin Time: 30.3 seconds — ABNORMAL HIGH (ref 11.4–15.2)

## 2018-08-15 NOTE — Progress Notes (Signed)
Pt has had 3 episodes of loose incontinent stool today.  Pt can just barely make it to the bedside commode before she has brown/red/ jelly like stools.  Pt says her bm's have been like this for years, "It's got so bad, I can't go out to eat anymore because I'll mess myself."  Pt states she has a GI doctor who put her on imodium, but it does not help, "I have IBS".

## 2018-08-15 NOTE — Progress Notes (Signed)
PROGRESS NOTE        PATIENT DETAILS Name: [name] Age: 78 y.o. Sex: female Date of Birth: [birth year] Admit Date: 08/13/2018 Admitting Physician Mauricio Gerome Apley, MD [id number], Grace Bushy, MD  Brief Narrative: Patient is a 78 y.o. female history of DM-2, hypertension, dyslipidemia, atrial fibrillation on Coumadin-presented with sore throat, left ear pain-found to have left sided tonsillitis-possibly early intratonsillar abscess.  See below for further details  Subjective: Feeling overall better-throat is still sore-but voice quality has improved compared to yesterday.  Assessment/Plan: Left-sided tonsillitis with early intratonsillar abscess: Slowly improving-voice quality better-throat still sore-tonsils still enlarged but compared to yesterday much improved.  No airway issues.  Cytosis has resolved.  Continue IV Unasyn-if clinical improvement continues-suspect we could consider transitioning to oral antimicrobial therapy in the next day or so.  Tolerating full liquids-we will do a trial of soft diet today.  Appreciate ENT input yesterday-since patient is symptoms are improving-hopefully we can avoid incision and drainage.  DM-2: CBGs stable-continue SSI.  Hypertension: Continue losartan, Cardizem.  Follow and optimize.   PAF: Rate controlled with Cardizem, Coumadin per pharmacy-INR therapeutic.    Hypothyroidism: Continue Synthroid  Depression: Stable-continue Lexapro  Asthma: Lungs clear-stable-continue bronchodilators.  Deconditioning/debility: Secondary to acute illness-PT evaluation completed-recommendations of SNF on discharge.  Await insurance authorization.  DVT Prophylaxis: Full dose anticoagulation with Coumadin  Code Status: Full code  Family Communication: Niece at bedside  Disposition Plan: Remain inpatient  Antimicrobial agents: Anti-infectives (From admission, onward)   Start     Dose/Rate Route Frequency Ordered  Stop   08/13/18 1715  Ampicillin-Sulbactam (UNASYN) 3 g in sodium chloride 0.9 % 100 mL IVPB     3 g 200 mL/hr over 30 Minutes Intravenous Every 6 hours 08/13/18 1705     08/13/18 1415  clindamycin (CLEOCIN) IVPB 600 mg     600 mg 100 mL/hr over 30 Minutes Intravenous  Once 08/13/18 1407 08/13/18 1503      Procedures: None  CONSULTS:  ENT  Time spent: 25- minutes-Greater than 50% of this time was spent in counseling, explanation of diagnosis, planning of further management, and coordination of care.  MEDICATIONS: Scheduled Meds: . atorvastatin  40 mg Oral Daily  . budesonide (PULMICORT) nebulizer solution  0.5 mg Nebulization BID  . diltiazem  180 mg Oral Daily  . escitalopram  20 mg Oral Daily  . ibuprofen  400 mg Oral Q6H  . insulin aspart  0-9 Units Subcutaneous TID WC  . levothyroxine  75 mcg Oral Daily  . losartan  50 mg Oral Daily  . pantoprazole  40 mg Oral BID AC  . Warfarin - Pharmacist Dosing Inpatient   Does not apply q1800   Continuous Infusions: . sodium chloride 50 mL/hr at 08/14/18 1600  . ampicillin-sulbactam (UNASYN) IV 3 g (08/15/18 1045)   PRN Meds:.acetaminophen **OR** acetaminophen, albuterol, diphenoxylate-atropine, meclizine, morphine injection, nitroGLYCERIN, ondansetron **OR** ondansetron (ZOFRAN) IV, phenol   PHYSICAL EXAM: Vital signs: Vitals:   08/14/18 1421 08/14/18 2140 08/15/18 0529 08/15/18 0847  BP: (!) 108/49 (!) 127/46 128/76   Pulse: (!) 52 (!) 50 (!) 49   Resp: 18  18   Temp: 97.8 F (36.6 C) 98.1 F (36.7 C) 97.8 F (36.6 C)   TempSrc: Oral Oral Oral   SpO2: 92% 93% 94% 94%  Weight:      Height:  Filed Weights   08/13/18 1005  Weight: 90.7 kg   Body mass index is 37.79 kg/m.   General appearance:Awake, alert, not in any distress.  Eyes:no scleral icterus. HEENT: Atraumatic and Normocephalic Neck: supple, no JVD. Resp:Good air entry bilaterally,no rales or rhonchi CVS: S1 S2 regular GI: Bowel sounds present,  Non tender and not distended with no gaurding, rigidity or rebound. Extremities: B/L Lower Ext shows no edema, both legs are warm to touch Neurology:  Non focal Psychiatric: Normal judgment and insight. Normal mood. Musculoskeletal:No digital cyanosis Skin:No Rash, warm and dry Wounds:N/A  I have personally reviewed following labs and imaging studies  LABORATORY DATA: CBC: Recent Labs  Lab 08/13/18 1205 08/14/18 0334 08/15/18 0442  WBC 15.0* 15.9* 10.5  NEUTROABS 13.1*  --   --   HGB 12.1 13.0 12.0  HCT 39.5 40.4 38.0  MCV 89.2 87.3 87.8  PLT 241 257 161    Basic Metabolic Panel: Recent Labs  Lab 08/13/18 1205 08/14/18 0334  NA 139 139  K 4.0 3.7  CL 103 102  CO2 25 25  GLUCOSE 110* 115*  BUN 14 9  CREATININE 0.72 0.77  CALCIUM 9.0 9.0    GFR: Estimated Creatinine Clearance: 58.5 mL/min (by C-G formula based on SCr of 0.77 mg/dL).  Liver Function Tests: No results for input(s): AST, ALT, ALKPHOS, BILITOT, PROT, ALBUMIN in the last 168 hours. No results for input(s): LIPASE, AMYLASE in the last 168 hours. No results for input(s): AMMONIA in the last 168 hours.  Coagulation Profile: Recent Labs  Lab 08/10/18 1322 08/13/18 1205 08/15/18 0442  INR 2.6 2.13 2.95    Cardiac Enzymes: No results for input(s): CKTOTAL, CKMB, CKMBINDEX, TROPONINI in the last 168 hours.  BNP (last 3 results) No results for input(s): PROBNP in the last 8760 hours.  HbA1C: No results for input(s): HGBA1C in the last 72 hours.  CBG: Recent Labs  Lab 08/14/18 0738 08/14/18 1143 08/14/18 1710 08/14/18 2315 08/15/18 0800  GLUCAP 105* 205* 205* 145* 129*    Lipid Profile: No results for input(s): CHOL, HDL, LDLCALC, TRIG, CHOLHDL, LDLDIRECT in the last 72 hours.  Thyroid Function Tests: No results for input(s): TSH, T4TOTAL, FREET4, T3FREE, THYROIDAB in the last 72 hours.  Anemia Panel: No results for input(s): VITAMINB12, FOLATE, FERRITIN, TIBC, IRON, RETICCTPCT in  the last 72 hours.  Urine analysis:    Component Value Date/Time   COLORURINE YELLOW 04/24/2018 1020   APPEARANCEUR HAZY (A) 04/24/2018 1020   LABSPEC 1.029 04/24/2018 1020   PHURINE 6.0 04/24/2018 1020   GLUCOSEU NEGATIVE 04/24/2018 1020   HGBUR SMALL (A) 04/24/2018 1020   BILIRUBINUR NEGATIVE 04/24/2018 1020   KETONESUR NEGATIVE 04/24/2018 1020   PROTEINUR NEGATIVE 04/24/2018 1020   UROBILINOGEN 0.2 05/08/2014 2240   NITRITE NEGATIVE 04/24/2018 1020   LEUKOCYTESUR LARGE (A) 04/24/2018 1020    Sepsis Labs: Lactic Acid, Venous    Component Value Date/Time   LATICACIDVEN 1.0 05/10/2016 2125    MICROBIOLOGY: No results found for this or any previous visit (from the past 240 hour(s)).  RADIOLOGY STUDIES/RESULTS: Ct Soft Tissue Neck W Contrast  Result Date: 08/13/2018 CLINICAL DATA:  Throat pain and difficulty swallowing. EXAM: CT NECK WITH CONTRAST TECHNIQUE: Multidetector CT imaging of the neck was performed using the standard protocol following the bolus administration of intravenous contrast. CONTRAST:  43mL OMNIPAQUE IOHEXOL 300 MG/ML  SOLN COMPARISON:  None. FINDINGS: Pharynx and larynx: There is a large tonsillar abscess on the LEFT, roughly 2.2 x  2.1 cm cross-section. There is surrounding LEFT parapharyngeal inflammation, but no frank peritonsillar abscess. There is also abnormal retropharyngeal fluid, possible retropharyngeal abscess, 13 x 7 mm cross-section extending over at least 2-3 vertebral body segments. Abnormal inflammatory change extends into the paraglottic region on the LEFT. This results in significant glottic narrowing. Correlate clinically for symptoms of stridor or airway compromise. Urgent ENT consultation is recommended. Salivary glands: No inflammation, mass, or stone. Thyroid: Normal. Lymph nodes: Reactive cervical lymph nodes. Vascular: Patent. Atherosclerosis. Limited intracranial: Negative. Visualized orbits: Negative. Mastoids and visualized paranasal  sinuses: No definite layering sinus fluid. Skeleton: Spondylosis. Upper chest: No mass or pneumothorax. No subglottic narrowing. Other: None. IMPRESSION: 1. Large tonsillar abscess on the LEFT, roughly 2.2 x 2.1 cm cross-section. Surrounding LEFT parapharyngeal inflammation, but no frank peritonsillar abscess. 2. Inflammatory change extends caudally into the LEFT paraglottic space, and there is effacement of the supraglottic airway. Urgent ENT consultation is warranted. 3. Retropharyngeal effusion versus abscess, 13 x 7 mm cross-section extending over at least 2-3 vertebral body segments. 4. These results were called by telephone at the time of interpretation on 08/13/2018 at 2:07 pm to Dr. Roderic Palau , who verbally acknowledged these results. Electronically Signed   By: Staci Righter M.D.   On: 08/13/2018 14:09     LOS: 2 days   Oren Binet, MD  [facility]  If 7PM-7AM, please contact night-coverage  Please page via [email or web address]  Go to [email or web address] and use [place]'s universal password to access. If you do not have the password, please contact the hospital operator.  Locate the [facility] provider you are looking for under [facility] and page to a number that you can be directly reached. If you still have difficulty reaching the provider, please page the [facility] (Director on Call) for the Hospitalists listed on amion for assistance.  08/15/2018, 11:08 AM

## 2018-08-15 NOTE — Progress Notes (Signed)
ANTICOAGULATION CONSULT NOTE - Initial Consult  Pharmacy Consult for Coumadin Indication: atrial fibrillation  Allergies  Allergen Reactions  . Cefzil [Cefprozil] Nausea And Vomiting  . Demerol Nausea And Vomiting    Patient Measurements: Height: 5\' 1"  (154.9 cm) Weight: 200 lb (90.7 kg) IBW/kg (Calculated) : 47.8 Vital Signs: Temp: 97.8 F (36.6 C) (02/22 0529) Temp Source: Oral (02/22 0529) BP: 128/76 (02/22 0529) Pulse Rate: 49 (02/22 0529)  Labs: Recent Labs    08/13/18 1205 08/14/18 0334 08/15/18 0442  HGB 12.1 13.0 12.0  HCT 39.5 40.4 38.0  PLT 241 257 290  LABPROT 23.6*  --  30.3*  INR 2.13  --  2.95  CREATININE 0.72 0.77  --     Estimated Creatinine Clearance: 58.5 mL/min (by C-G formula based on SCr of 0.77 mg/dL).   Medical History: Past Medical History:  Diagnosis Date  . Allergy   . Allergy history unknown   . Arthritis   . ASCVD (arteriosclerotic cardiovascular disease)    stent to mid and proximal left anterior descending in 06/2002;drug eluting stent placed in the second diagnol in 08/2003 after  A  non-st elevation myocardial infarction   . Asthma   . Atrial fibrillation ([place])   . Chronic anticoagulation   . Chronic nausea   . Chronic pain of left knee   . Coronary artery disease   . Current chronic use of systemic steroids 05/11/2016  . Depression   . Diabetes mellitus    no insulin  . Diarrhea    acute  . Diverticulitis of intestine   . FH: colonic polyps    adenomataous  . GERD (gastroesophageal reflux disease)   . History of kidney stones   . Hyperlipidemia    pulmonary embolism 2000 and 09/2008  . Hypertension   . Hypothyroidism   . Insomnia   . Irritable bowel syndrome   . Myocardial infarction ([place])   . Paroxysmal atrial fibrillation ([facility])    normal LV function; episodes occurred in 205 and 09/2007  . Pedal edema   . Peripheral edema   . PONV (postoperative nausea and vomiting)   . Pulmonary embolism ([place])    2000/09/2008   . Rectal bleeding   . Sleep apnea   . Thyroid disease    hypothyroidism  . Tobacco user    stopped   . Venous stasis    Assessment: [name] is a 78yo female presenting with tonsillar abscess, Left ear and throat pain. Afib on coumadin PTA.  INR 2.13 on admit. CBC WNL. CHADS2VASC=6. Missed dose 2/20.  - PTA Warfarin: 5mg  MWFSat, 2.5mg  TTSun with admit INR 2.13.  2/22 AM: INR this morning bumped from 2.13 >> 2.95 after 6mg  dose last night. Will hold tonight's dose given rapid rise in INR. Likely d/t dietary constraints with tonsillar abscess.  No bleeding noted and CBC stable/WNL. Likely resume tomorrow.  Goal of Therapy:  INR 2-3 Monitor platelets by anticoagulation protocol: Yes   Plan:  Hold coumadin tonight x1 Likely resume tomorrow at 2.5mg  Daily INR  Thank you for involving pharmacy in this patient's care.  Janae Bridgeman, PharmD PGY1 Pharmacy Resident Phone: [phone] 08/15/2018 9:46 AM

## 2018-08-16 LAB — CBC
HCT: 39.6 % (ref 36.0–46.0)
HEMOGLOBIN: 12.3 g/dL (ref 12.0–15.0)
MCH: 27 pg (ref 26.0–34.0)
MCHC: 31.1 g/dL (ref 30.0–36.0)
MCV: 87 fL (ref 80.0–100.0)
Platelets: 300 10*3/uL (ref 150–400)
RBC: 4.55 MIL/uL (ref 3.87–5.11)
RDW: 15.3 % (ref 11.5–15.5)
WBC: 10.6 10*3/uL — ABNORMAL HIGH (ref 4.0–10.5)
nRBC: 0 % (ref 0.0–0.2)

## 2018-08-16 LAB — GLUCOSE, CAPILLARY
GLUCOSE-CAPILLARY: 88 mg/dL (ref 70–99)
Glucose-Capillary: 83 mg/dL (ref 70–99)
Glucose-Capillary: 76 mg/dL (ref 70–99)
Glucose-Capillary: 115 mg/dL — ABNORMAL HIGH (ref 70–99)

## 2018-08-16 LAB — PROTIME-INR
INR: 3.98
Prothrombin Time: 38.2 seconds — ABNORMAL HIGH (ref 11.4–15.2)

## 2018-08-16 MED ORDER — FUROSEMIDE 40 MG PO TABS
40.0000 mg | ORAL_TABLET | Freq: Every day | ORAL | Status: DC
  Administered 2018-08-16 – 2018-08-17 (×2): 40 mg via ORAL
  Filled 2018-08-16 (×3): qty 1

## 2018-08-16 NOTE — Progress Notes (Signed)
ANTICOAGULATION and ANTIBIOTIC CONSULT NOTE - Initial Consult  Pharmacy Consult for Coumadin Indication: atrial fibrillation  Allergies  Allergen Reactions  . Cefzil [Cefprozil] Nausea And Vomiting  . Demerol Nausea And Vomiting    Patient Measurements: Height: 5\' 1"  (154.9 cm) Weight: 200 lb (90.7 kg) IBW/kg (Calculated) : 47.8 Vital Signs: Temp: 98.2 F (36.8 C) (02/23 0447) Temp Source: Oral (02/22 2154) BP: 143/80 (02/23 0447) Pulse Rate: 91 (02/23 0812)  Labs: Recent Labs    08/13/18 1205 08/14/18 0334 08/15/18 0442 08/16/18 0552  HGB 12.1 13.0 12.0 12.3  HCT 39.5 40.4 38.0 39.6  PLT 241 257 290 300  LABPROT 23.6*  --  30.3* 38.2*  INR 2.13  --  2.95 3.98  CREATININE 0.72 0.77  --   --     Estimated Creatinine Clearance: 58.5 mL/min (by C-G formula based on SCr of 0.77 mg/dL).   Medical History: Past Medical History:  Diagnosis Date  . Allergy   . Allergy history unknown   . Arthritis   . ASCVD (arteriosclerotic cardiovascular disease)    stent to mid and proximal left anterior descending in 06/2002;drug eluting stent placed in the second diagnol in 08/2003 after  A  non-st elevation myocardial infarction   . Asthma   . Atrial fibrillation ([place])   . Chronic anticoagulation   . Chronic nausea   . Chronic pain of left knee   . Coronary artery disease   . Current chronic use of systemic steroids 05/11/2016  . Depression   . Diabetes mellitus    no insulin  . Diarrhea    acute  . Diverticulitis of intestine   . FH: colonic polyps    adenomataous  . GERD (gastroesophageal reflux disease)   . History of kidney stones   . Hyperlipidemia    pulmonary embolism 2000 and 09/2008  . Hypertension   . Hypothyroidism   . Insomnia   . Irritable bowel syndrome   . Myocardial infarction ([place])   . Paroxysmal atrial fibrillation (H[facility])    normal LV function; episodes occurred in 205 and 09/2007  . Pedal edema   . Peripheral edema   . PONV (postoperative nausea  and vomiting)   . Pulmonary embolism ([place])    2000/09/2008  . Rectal bleeding   . Sleep apnea   . Thyroid disease    hypothyroidism  . Tobacco user    stopped   . Venous stasis    Assessment: [name] is a 78yo female presenting with tonsillar abscess, Left ear and throat pain. Afib on coumadin PTA.  INR 2.13 on admit. CBC WNL. CHADS2VASC=6. Missed dose 2/20.  - PTA Warfarin: 5mg  MWFSat, 2.5mg  TTSun with admit INR 2.13.  2/23 AM: INR this morning bumped from 2.95 >> 3.98 after holding dose last night. Will hold tonight's dose again given rapid rise in INR. Likely d/t recent 6mg  dose and dietary constraints with tonsillar abscess.  No bleeding noted and CBC stable/WNL.   Goal of Therapy:  INR 2-3 Monitor platelets by anticoagulation protocol: Yes   Plan:  Hold coumadin tonight x1 Daily INR Continue Unasyn 3g q6h Monitor renal function, clinical improvement, cultures as appropriate May consider stepping down to PO antimicrobials as pt clinically improves  Thank you for involving pharmacy in this patient's care.  Janae Bridgeman, PharmD PGY1 Pharmacy Resident Phone: [phone] 08/16/2018 8:17 AM

## 2018-08-16 NOTE — Progress Notes (Signed)
PROGRESS NOTE        PATIENT DETAILS Name: [name] Age: 78 y.o. Sex: female Date of Birth: 02/03/1941 Admit Date: 08/13/2018 Admitting Physician Mauricio Gerome Apley, MD [id number], Grace Bushy, MD  Brief Narrative: Patient is a 78 y.o. female history of DM-2, hypertension, dyslipidemia, atrial fibrillation on Coumadin-presented with sore throat, left ear pain-found to have left sided tonsillitis-possibly early intratonsillar abscess.  See below for further details  Subjective: Her voice is back to normal-throat is still sore but slowly getting better every day.  No fever.  Assessment/Plan: Left-sided tonsillitis with early intratonsillar abscess: Slow improvement continues-voice quality is essentially back to normal.  Afebrile, no further leukocytosis.  Continue IV Unasyn-we will consider transitioning to Augmentin in the next day or so.  Tolerating soft diet. Appreciate ENT input yesterday-since patient is symptoms are improving-hopefully we can avoid incision and drainage.  DM-2: CBGs stable-continue SSI.  Hypertension: Continue losartan, Cardizem.  Follow and optimize.   PAF: Rate controlled with Cardizem-continue Coumadin per pharmacy.   Hypothyroidism: Continue Synthroid  Depression: Stable-continue Lexapro  Asthma: Lungs clear-stable-continue bronchodilators.  Deconditioning/debility: Secondary to acute illness-PT evaluation completed-recommendations of SNF on discharge.  Await insurance authorization.  DVT Prophylaxis: Full dose anticoagulation with Coumadin  Code Status: Full code  Family Communication: None at bedside  Disposition Plan: Remain inpatient-SNF on discharge-if bed available/insurance authorization obtained  Antimicrobial agents: Anti-infectives (From admission, onward)   Start     Dose/Rate Route Frequency Ordered Stop   08/13/18 1715  Ampicillin-Sulbactam (UNASYN) 3 g in sodium chloride 0.9 % 100 mL IVPB     3  g 200 mL/hr over 30 Minutes Intravenous Every 6 hours 08/13/18 1705     08/13/18 1415  clindamycin (CLEOCIN) IVPB 600 mg     600 mg 100 mL/hr over 30 Minutes Intravenous  Once 08/13/18 1407 08/13/18 1503      Procedures: None  CONSULTS:  ENT  Time spent: 25- minutes-Greater than 50% of this time was spent in counseling, explanation of diagnosis, planning of further management, and coordination of care.  MEDICATIONS: Scheduled Meds: . atorvastatin  40 mg Oral Daily  . budesonide (PULMICORT) nebulizer solution  0.5 mg Nebulization BID  . diltiazem  180 mg Oral Daily  . escitalopram  20 mg Oral Daily  . insulin aspart  0-9 Units Subcutaneous TID WC  . levothyroxine  75 mcg Oral Daily  . losartan  50 mg Oral Daily  . pantoprazole  40 mg Oral BID AC  . Warfarin - Pharmacist Dosing Inpatient   Does not apply q1800   Continuous Infusions: . sodium chloride 50 mL/hr at 08/16/18 0616  . ampicillin-sulbactam (UNASYN) IV 3 g (08/16/18 1126)   PRN Meds:.acetaminophen **OR** acetaminophen, albuterol, diphenoxylate-atropine, meclizine, morphine injection, nitroGLYCERIN, ondansetron **OR** ondansetron (ZOFRAN) IV, phenol   PHYSICAL EXAM: Vital signs: Vitals:   08/15/18 2109 08/15/18 2154 08/16/18 0447 08/16/18 0812  BP:  (!) 143/94 (!) 143/80   Pulse:  92 87 91  Resp:  16 18 18   Temp:  (!) 97.5 F (36.4 C) 98.2 F (36.8 C)   TempSrc:  Oral    SpO2: 94% 97% 98% 98%  Weight:      Height:       Filed Weights   08/13/18 1005  Weight: 90.7 kg   Body mass index is 37.79 kg/m.   appearance:Awake,  alert, not in any distress.  Eyes:no scleral icterus. HEENT: Atraumatic and Normocephalic Neck: supple, no JVD. Resp:Good air entry bilaterally,no rales or rhonchi CVS: S1 S2 regular GI: Bowel sounds present, Non tender and not distended with no gaurding, rigidity or rebound. Extremities: B/L Lower Ext shows no edema, both legs are warm to touch Neurology:  Non  focal Musculoskeletal:No digital cyanosis Skin:No Rash, warm and dry Wounds:N/A  I have personally reviewed following labs and imaging studies  LABORATORY DATA: CBC: Recent Labs  Lab 08/13/18 1205 08/14/18 0334 08/15/18 0442 08/16/18 0552  WBC 15.0* 15.9* 10.5 10.6*  NEUTROABS 13.1*  --   --   --   HGB 12.1 13.0 12.0 12.3  HCT 39.5 40.4 38.0 39.6  MCV 89.2 87.3 87.8 87.0  PLT 241 257 290 382    Basic Metabolic Panel: Recent Labs  Lab 08/13/18 1205 08/14/18 0334  NA 139 139  K 4.0 3.7  CL 103 102  CO2 25 25  GLUCOSE 110* 115*  BUN 14 9  CREATININE 0.72 0.77  CALCIUM 9.0 9.0    GFR: Estimated Creatinine Clearance: 58.5 mL/min (by C-G formula based on SCr of 0.77 mg/dL).  Liver Function Tests: No results for input(s): AST, ALT, ALKPHOS, BILITOT, PROT, ALBUMIN in the last 168 hours. No results for input(s): LIPASE, AMYLASE in the last 168 hours. No results for input(s): AMMONIA in the last 168 hours.  Coagulation Profile: Recent Labs  Lab 08/10/18 1322 08/13/18 1205 08/15/18 0442 08/16/18 0552  INR 2.6 2.13 2.95 3.98    Cardiac Enzymes: No results for input(s): CKTOTAL, CKMB, CKMBINDEX, TROPONINI in the last 168 hours.  BNP (last 3 results) No results for input(s): PROBNP in the last 8760 hours.  HbA1C: No results for input(s): HGBA1C in the last 72 hours.  CBG: Recent Labs  Lab 08/15/18 1158 08/15/18 1709 08/15/18 2154 08/16/18 0806 08/16/18 1228  GLUCAP 126* 117* 108* 83 76    Lipid Profile: No results for input(s): CHOL, HDL, LDLCALC, TRIG, CHOLHDL, LDLDIRECT in the last 72 hours.  Thyroid Function Tests: No results for input(s): TSH, T4TOTAL, FREET4, T3FREE, THYROIDAB in the last 72 hours.  Anemia Panel: No results for input(s): VITAMINB12, FOLATE, FERRITIN, TIBC, IRON, RETICCTPCT in the last 72 hours.  Urine analysis:    Component Value Date/Time   COLORURINE YELLOW 04/24/2018 1020   APPEARANCEUR HAZY (A) 04/24/2018 1020    LABSPEC 1.029 04/24/2018 1020   PHURINE 6.0 04/24/2018 1020   GLUCOSEU NEGATIVE 04/24/2018 1020   HGBUR SMALL (A) 04/24/2018 1020   BILIRUBINUR NEGATIVE 04/24/2018 1020   KETONESUR NEGATIVE 04/24/2018 1020   PROTEINUR NEGATIVE 04/24/2018 1020   UROBILINOGEN 0.2 05/08/2014 2240   NITRITE NEGATIVE 04/24/2018 1020   LEUKOCYTESUR LARGE (A) 04/24/2018 1020    Sepsis Labs: Lactic Acid, Venous    Component Value Date/Time   LATICACIDVEN 1.0 05/10/2016 2125    MICROBIOLOGY: No results found for this or any previous visit (from the past 240 hour(s)).  RADIOLOGY STUDIES/RESULTS: Ct Soft Tissue Neck W Contrast  Result Date: 08/13/2018 CLINICAL DATA:  Throat pain and difficulty swallowing. EXAM: CT NECK WITH CONTRAST TECHNIQUE: Multidetector CT imaging of the neck was performed using the standard protocol following the bolus administration of intravenous contrast. CONTRAST:  28mL OMNIPAQUE IOHEXOL 300 MG/ML  SOLN COMPARISON:  None. FINDINGS: Pharynx and larynx: There is a large tonsillar abscess on the LEFT, roughly 2.2 x 2.1 cm cross-section. There is surrounding LEFT parapharyngeal inflammation, but no frank peritonsillar abscess. There is  also abnormal retropharyngeal fluid, possible retropharyngeal abscess, 13 x 7 mm cross-section extending over at least 2-3 vertebral body segments. Abnormal inflammatory change extends into the paraglottic region on the LEFT. This results in significant glottic narrowing. Correlate clinically for symptoms of stridor or airway compromise. Urgent ENT consultation is recommended. Salivary glands: No inflammation, mass, or stone. Thyroid: Normal. Lymph nodes: Reactive cervical lymph nodes. Vascular: Patent. Atherosclerosis. Limited intracranial: Negative. Visualized orbits: Negative. Mastoids and visualized paranasal sinuses: No definite layering sinus fluid. Skeleton: Spondylosis. Upper chest: No mass or pneumothorax. No subglottic narrowing. Other: None. IMPRESSION:  1. Large tonsillar abscess on the LEFT, roughly 2.2 x 2.1 cm cross-section. Surrounding LEFT parapharyngeal inflammation, but no frank peritonsillar abscess. 2. Inflammatory change extends caudally into the LEFT paraglottic space, and there is effacement of the supraglottic airway. Urgent ENT consultation is warranted. 3. Retropharyngeal effusion versus abscess, 13 x 7 mm cross-section extending over at least 2-3 vertebral body segments. 4. These results were called by telephone at the time of interpretation on 08/13/2018 at 2:07 pm to Dr. Roderic Palau , who verbally acknowledged these results. Electronically Signed   By: Staci Righter M.D.   On: 08/13/2018 14:09     LOS: 3 days   Oren Binet, MD  [facility]  If 7PM-7AM, please contact night-coverage  Please page via [email or web address]  Go to [email or web address] and use [place]'s universal password to access. If you do not have the password, please contact the hospital operator.  Locate the [facility] provider you are looking for under [facility] and page to a number that you can be directly reached. If you still have difficulty reaching the provider, please page the [facility] (Director on Call) for the Hospitalists listed on amion for assistance.  08/16/2018, 12:38 PM

## 2018-08-17 LAB — GLUCOSE, CAPILLARY
GLUCOSE-CAPILLARY: 123 mg/dL — AB (ref 70–99)
Glucose-Capillary: 87 mg/dL (ref 70–99)
Glucose-Capillary: 95 mg/dL (ref 70–99)
Glucose-Capillary: 103 mg/dL — ABNORMAL HIGH (ref 70–99)

## 2018-08-17 LAB — PROTIME-INR
INR: 3.3
Prothrombin Time: 33 seconds — ABNORMAL HIGH (ref 11.4–15.2)

## 2018-08-17 MED ORDER — AMOXICILLIN-POT CLAVULANATE 875-125 MG PO TABS
1.0000 | ORAL_TABLET | Freq: Two times a day (BID) | ORAL | Status: DC
  Administered 2018-08-17 – 2018-08-18 (×3): 1 via ORAL
  Filled 2018-08-17 (×3): qty 1

## 2018-08-17 NOTE — Care Management Important Message (Signed)
Important Message  Patient Details  Name: [name] MRN: [id number] Date of Birth: 15-Feb-1941   Medicare Important Message Given:  Yes    Orbie Pyo 08/17/2018, 4:35 PM

## 2018-08-17 NOTE — Progress Notes (Signed)
PROGRESS NOTE        PATIENT DETAILS Name: [name] Age: 78 y.o. Sex: female Date of Birth: 02-Jun-1941 Admit Date: 08/13/2018 Admitting Physician Mauricio Gerome Apley, MD [id number], Grace Bushy, MD  Brief Narrative: Patient is a 78 y.o. female history of DM-2, hypertension, dyslipidemia, atrial fibrillation on Coumadin-presented with sore throat, left ear pain-found to have left sided tonsillitis-possibly early intratonsillar abscess.  See below for further details  Subjective: Throat is still sore-but voice is back to normal.  Assessment/Plan: Left-sided tonsillitis with early intratonsillar abscess: Improved-voice quality back to normal, afebrile-no further leukocytosis.  Has been on Unasyn since admission-since clinically improved-can transition to Augmentin today.  Continues to have a enlarged left tonsil-but markedly better than the past few days.  Has tolerated advancement in diet.  We will plan on a 2-week course of antimicrobial therapy followed by ENT follow-up after she has completed a course of antimicrobial therapy.  DM-2: CBG stable, continue SSI.  Hypertension: Controlled-continue losartan and Cardizem.  Follow and optimize   PAF: Rate controlled with Cardizem-continue Coumadin per pharmacy.  Still slightly subtherapeutic but improving.  Hypothyroidism: Continue Synthroid  Depression: Stable-continue Lexapro  Asthma: Lungs clear-stable-continue bronchodilators.  Deconditioning/debility: Secondary to acute illness-PT evaluation completed-recommendations of SNF on discharge.  Await insurance authorization.  DVT Prophylaxis: Full dose anticoagulation with Coumadin  Code Status: Full code  Family Communication: None at bedside  Disposition Plan: Remain inpatient-SNF on discharge-if bed available/insurance authorization obtained  Antimicrobial agents: Anti-infectives (From admission, onward)   Start     Dose/Rate Route Frequency  Ordered Stop   08/13/18 1715  Ampicillin-Sulbactam (UNASYN) 3 g in sodium chloride 0.9 % 100 mL IVPB     3 g 200 mL/hr over 30 Minutes Intravenous Every 6 hours 08/13/18 1705     08/13/18 1415  clindamycin (CLEOCIN) IVPB 600 mg     600 mg 100 mL/hr over 30 Minutes Intravenous  Once 08/13/18 1407 08/13/18 1503      Procedures: None  CONSULTS:  ENT  Time spent: 25- minutes-Greater than 50% of this time was spent in counseling, explanation of diagnosis, planning of further management, and coordination of care.  MEDICATIONS: Scheduled Meds: . atorvastatin  40 mg Oral Daily  . budesonide (PULMICORT) nebulizer solution  0.5 mg Nebulization BID  . diltiazem  180 mg Oral Daily  . escitalopram  20 mg Oral Daily  . furosemide  40 mg Oral Daily  . insulin aspart  0-9 Units Subcutaneous TID WC  . levothyroxine  75 mcg Oral Daily  . losartan  50 mg Oral Daily  . pantoprazole  40 mg Oral BID AC  . Warfarin - Pharmacist Dosing Inpatient   Does not apply q1800   Continuous Infusions: . sodium chloride 10 mL/hr (08/16/18 1700)  . ampicillin-sulbactam (UNASYN) IV 3 g (08/17/18 0456)   PRN Meds:.acetaminophen **OR** acetaminophen, albuterol, diphenoxylate-atropine, meclizine, morphine injection, nitroGLYCERIN, ondansetron **OR** ondansetron (ZOFRAN) IV, phenol   PHYSICAL EXAM: Vital signs: Vitals:   08/16/18 2056 08/16/18 2214 08/17/18 0600 08/17/18 0952  BP:  (!) 137/99 (!) 155/94   Pulse:  (!) 51 62   Resp:  18 18   Temp:  97.8 F (36.6 C) 98.1 F (36.7 C)   TempSrc:   Oral   SpO2: 97% 96% 95% 99%  Weight:      Height:  Filed Weights   08/13/18 1005  Weight: 90.7 kg   Body mass index is 37.79 kg/m.   General appearance:Awake, alert, not in any distress.  Eyes:no scleral icterus. HEENT: Atraumatic and Normocephalic Neck: supple, no JVD. Resp:Good air entry bilaterally,no rales or rhonchi CVS: S1 S2 regular, no murmurs.  GI: Bowel sounds present, Non tender and  not distended with no gaurding, rigidity or rebound. Extremities: B/L Lower Ext shows no edema, both legs are warm to touch Neurology:  Non focal Psychiatric: Normal judgment and insight. Normal mood. Musculoskeletal:No digital cyanosis Skin:No Rash, warm and dry Wounds:N/A  I have personally reviewed following labs and imaging studies  LABORATORY DATA: CBC: Recent Labs  Lab 08/13/18 1205 08/14/18 0334 08/15/18 0442 08/16/18 0552  WBC 15.0* 15.9* 10.5 10.6*  NEUTROABS 13.1*  --   --   --   HGB 12.1 13.0 12.0 12.3  HCT 39.5 40.4 38.0 39.6  MCV 89.2 87.3 87.8 87.0  PLT 241 257 290 448    Basic Metabolic Panel: Recent Labs  Lab 08/13/18 1205 08/14/18 0334  NA 139 139  K 4.0 3.7  CL 103 102  CO2 25 25  GLUCOSE 110* 115*  BUN 14 9  CREATININE 0.72 0.77  CALCIUM 9.0 9.0    GFR: Estimated Creatinine Clearance: 58.5 mL/min (by C-G formula based on SCr of 0.77 mg/dL).  Liver Function Tests: No results for input(s): AST, ALT, ALKPHOS, BILITOT, PROT, ALBUMIN in the last 168 hours. No results for input(s): LIPASE, AMYLASE in the last 168 hours. No results for input(s): AMMONIA in the last 168 hours.  Coagulation Profile: Recent Labs  Lab 08/10/18 1322 08/13/18 1205 08/15/18 0442 08/16/18 0552 08/17/18 0459  INR 2.6 2.13 2.95 3.98 3.30    Cardiac Enzymes: No results for input(s): CKTOTAL, CKMB, CKMBINDEX, TROPONINI in the last 168 hours.  BNP (last 3 results) No results for input(s): PROBNP in the last 8760 hours.  HbA1C: No results for input(s): HGBA1C in the last 72 hours.  CBG: Recent Labs  Lab 08/16/18 1228 08/16/18 1712 08/16/18 2216 08/17/18 0752 08/17/18 1133  GLUCAP 76 88 115* 87 123*    Lipid Profile: No results for input(s): CHOL, HDL, LDLCALC, TRIG, CHOLHDL, LDLDIRECT in the last 72 hours.  Thyroid Function Tests: No results for input(s): TSH, T4TOTAL, FREET4, T3FREE, THYROIDAB in the last 72 hours.  Anemia Panel: No results for  input(s): VITAMINB12, FOLATE, FERRITIN, TIBC, IRON, RETICCTPCT in the last 72 hours.  Urine analysis:    Component Value Date/Time   COLORURINE YELLOW 04/24/2018 1020   APPEARANCEUR HAZY (A) 04/24/2018 1020   LABSPEC 1.029 04/24/2018 1020   PHURINE 6.0 04/24/2018 1020   GLUCOSEU NEGATIVE 04/24/2018 1020   HGBUR SMALL (A) 04/24/2018 1020   BILIRUBINUR NEGATIVE 04/24/2018 1020   KETONESUR NEGATIVE 04/24/2018 1020   PROTEINUR NEGATIVE 04/24/2018 1020   UROBILINOGEN 0.2 05/08/2014 2240   NITRITE NEGATIVE 04/24/2018 1020   LEUKOCYTESUR LARGE (A) 04/24/2018 1020    Sepsis Labs: Lactic Acid, Venous    Component Value Date/Time   LATICACIDVEN 1.0 05/10/2016 2125    MICROBIOLOGY: No results found for this or any previous visit (from the past 240 hour(s)).  RADIOLOGY STUDIES/RESULTS: Ct Soft Tissue Neck W Contrast  Result Date: 08/13/2018 CLINICAL DATA:  Throat pain and difficulty swallowing. EXAM: CT NECK WITH CONTRAST TECHNIQUE: Multidetector CT imaging of the neck was performed using the standard protocol following the bolus administration of intravenous contrast. CONTRAST:  46mL OMNIPAQUE IOHEXOL 300 MG/ML  SOLN  COMPARISON:  None. FINDINGS: Pharynx and larynx: There is a large tonsillar abscess on the LEFT, roughly 2.2 x 2.1 cm cross-section. There is surrounding LEFT parapharyngeal inflammation, but no frank peritonsillar abscess. There is also abnormal retropharyngeal fluid, possible retropharyngeal abscess, 13 x 7 mm cross-section extending over at least 2-3 vertebral body segments. Abnormal inflammatory change extends into the paraglottic region on the LEFT. This results in significant glottic narrowing. Correlate clinically for symptoms of stridor or airway compromise. Urgent ENT consultation is recommended. Salivary glands: No inflammation, mass, or stone. Thyroid: Normal. Lymph nodes: Reactive cervical lymph nodes. Vascular: Patent. Atherosclerosis. Limited intracranial: Negative.  Visualized orbits: Negative. Mastoids and visualized paranasal sinuses: No definite layering sinus fluid. Skeleton: Spondylosis. Upper chest: No mass or pneumothorax. No subglottic narrowing. Other: None. IMPRESSION: 1. Large tonsillar abscess on the LEFT, roughly 2.2 x 2.1 cm cross-section. Surrounding LEFT parapharyngeal inflammation, but no frank peritonsillar abscess. 2. Inflammatory change extends caudally into the LEFT paraglottic space, and there is effacement of the supraglottic airway. Urgent ENT consultation is warranted. 3. Retropharyngeal effusion versus abscess, 13 x 7 mm cross-section extending over at least 2-3 vertebral body segments. 4. These results were called by telephone at the time of interpretation on 08/13/2018 at 2:07 pm to Dr. Roderic Palau , who verbally acknowledged these results. Electronically Signed   By: Staci Righter M.D.   On: 08/13/2018 14:09     LOS: 4 days   Oren Binet, MD  [facility]  If 7PM-7AM, please contact night-coverage  Please page via [email or web address]  Go to [email or web address] and use [place]'s universal password to access. If you do not have the password, please contact the hospital operator.  Locate the [facility] provider you are looking for under [facility] and page to a number that you can be directly reached. If you still have difficulty reaching the provider, please page the [facility] (Director on Call) for the Hospitalists listed on amion for assistance.  08/17/2018, 11:43 AM

## 2018-08-17 NOTE — Progress Notes (Signed)
Physical Therapy Treatment Patient Details Name: Donovan NASYA VINCENT MRN: [id number] DOB: 01/15/1941 Today's Date: 08/17/2018    History of Present Illness 78yo female presenting with throat pain of 10 days in duration, now exacerbated to the point that she cannot speak and has difficulty swallowing. Diagnosed with L tonsillar abscess, admitted for IV antibiotics and medical management. PMH asthma, PAF, on chronic anticoagulants, knee pain, DM, hx PE, hx MI, hx heel and knee surgery     PT Comments    Pt is making progress towards her goals, however continues to be limited in safe mobility by decreased balance, strength and endurance. Pt currently requires min guard for bed mobility, min A for transfers and modA for ambulation of 5 feet with RW. PT continues to recommend SNF at discharge to improve balance and strength to be able to safely return home.      Follow Up Recommendations  SNF;Supervision/Assistance - 24 hour     Equipment Recommendations  Other (comment)(defer to next venue )       Precautions / Restrictions Precautions Precautions: Fall;Other (comment) Precaution Comments: watch vitals  Restrictions Weight Bearing Restrictions: No    Mobility  Bed Mobility Overal bed mobility: Needs Assistance Bed Mobility: Supine to Sit;Sit to Supine     Supine to sit: Min guard     General bed mobility comments: min guard for safety, increased time and effort  Transfers Overall transfer level: Needs assistance Equipment used: Rolling walker (2 wheeled) Transfers: Sit to/from Stand Sit to Stand: Min assist         General transfer comment: minA for steadying in standing, cues for upright posture, slight dizziness which quickly dissipated  Ambulation/Gait Ambulation/Gait assistance: Mod assist Gait Distance (Feet): 5 Feet Assistive device: Rolling walker (2 wheeled) Gait Pattern/deviations: Step-through pattern;Decreased step length - right;Decreased step length -  left;Trunk flexed;Shuffle Gait velocity: slowed Gait velocity interpretation: <1.8 ft/sec, indicate of risk for recurrent falls General Gait Details: modA for steadying with ambulation of 5 feet to window and then to recliner, able to stand at window for 3 minutes before requesting to sit       Balance Overall balance assessment: Needs assistance Sitting-balance support: Bilateral upper extremity supported;Feet supported Sitting balance-Leahy Scale: Good     Standing balance support: Bilateral upper extremity supported;During functional activity Standing balance-Leahy Scale: Poor Standing balance comment: requires RW assist to maintain balance                            Cognition Arousal/Alertness: Awake/alert Behavior During Therapy: Flat affect;Impulsive Overall Cognitive Status: Impaired/Different from baseline Area of Impairment: Attention;Memory;Following commands;Safety/judgement;Awareness;Problem solving                 Orientation Level: Person;Place;Time;Situation Current Attention Level: Alternating Memory: Decreased short-term memory Following Commands: Follows multi-step commands consistently;Follows multi-step commands with increased time Safety/Judgement: Decreased awareness of safety Awareness: Emergent Problem Solving: Slow processing;Requires verbal cues;Requires tactile cues General Comments: Improved overall cognition, requires increased cuing and time      Exercises      General Comments General comments (skin integrity, edema, etc.): Pt willing to get out of bed to recliner, reports history of bed sores      Pertinent Vitals/Pain Pain Assessment: Faces Faces Pain Scale: Hurts a little bit Pain Location: throat  Pain Descriptors / Indicators: Aching;Sore Pain Intervention(s): Limited activity within patient's tolerance;Monitored during session;Repositioned           PT Goals (  current goals can now be found in the care plan  section) Acute Rehab PT Goals PT Goal Formulation: With patient Time For Goal Achievement: 08/28/18 Potential to Achieve Goals: Fair Progress towards PT goals: Progressing toward goals    Frequency    Min 2X/week      PT Plan Current plan remains appropriate       AM-PAC PT "6 Clicks" Mobility   Outcome Measure  Help needed turning from your back to your side while in a flat bed without using bedrails?: None Help needed moving from lying on your back to sitting on the side of a flat bed without using bedrails?: None Help needed moving to and from a bed to a chair (including a wheelchair)?: A Little Help needed standing up from a chair using your arms (e.g., wheelchair or bedside chair)?: A Little Help needed to walk in hospital room?: A Little Help needed climbing 3-5 steps with a railing? : A Lot 6 Click Score: 19    End of Session Equipment Utilized During Treatment: Gait belt Activity Tolerance: Patient limited by fatigue Patient left: with call bell/phone within reach;in chair Nurse Communication: Mobility status;Other (comment)(severe dizziness in standing, possibly orthostatic in nature ) PT Visit Diagnosis: Unsteadiness on feet (R26.81);Muscle weakness (generalized) (M62.81)     Time: 5003-7048 PT Time Calculation (min) (ACUTE ONLY): 19 min  Charges:  $Gait Training: 8-22 mins                     Edmund Holcomb B. Migdalia Dk PT, DPT Acute Rehabilitation Services Pager [phone] Office [phone]    [place] 08/17/2018, 1:27 PM

## 2018-08-17 NOTE — Progress Notes (Addendum)
[facility] still awaiting insurance approval. They are requesting updated PT note.  Percell Locus Jonia Oakey LCSW [phone]

## 2018-08-17 NOTE — Progress Notes (Signed)
ANTICOAGULATION and ANTIBIOTIC CONSULT NOTE - Initial Consult  Pharmacy Consult for Coumadin Indication: atrial fibrillation  Allergies  Allergen Reactions  . Cefzil [Cefprozil] Nausea And Vomiting  . Demerol Nausea And Vomiting    Patient Measurements: Height: 5\' 1"  (154.9 cm) Weight: 200 lb (90.7 kg) IBW/kg (Calculated) : 47.8 Vital Signs: Temp: 98.1 F (36.7 C) (02/24 0600) Temp Source: Oral (02/24 0600) BP: 155/94 (02/24 0600) Pulse Rate: 62 (02/24 0600)  Labs: Recent Labs    08/15/18 0442 08/16/18 0552 08/17/18 0459  HGB 12.0 12.3  --   HCT 38.0 39.6  --   PLT 290 300  --   LABPROT 30.3* 38.2* 33.0*  INR 2.95 3.98 3.30    Estimated Creatinine Clearance: 58.5 mL/min (by C-G formula based on SCr of 0.77 mg/dL).   Medical History: Past Medical History:  Diagnosis Date  . Allergy   . Allergy history unknown   . Arthritis   . ASCVD (arteriosclerotic cardiovascular disease)    stent to mid and proximal left anterior descending in 06/2002;drug eluting stent placed in the second diagnol in 08/2003 after  A  non-st elevation myocardial infarction   . Asthma   . Atrial fibrillation ([place])   . Chronic anticoagulation   . Chronic nausea   . Chronic pain of left knee   . Coronary artery disease   . Current chronic use of systemic steroids 05/11/2016  . Depression   . Diabetes mellitus    no insulin  . Diarrhea    acute  . Diverticulitis of intestine   . FH: colonic polyps    adenomataous  . GERD (gastroesophageal reflux disease)   . History of kidney stones   . Hyperlipidemia    pulmonary embolism 2000 and 09/2008  . Hypertension   . Hypothyroidism   . Insomnia   . Irritable bowel syndrome   . Myocardial infarction ([place])   . Paroxysmal atrial fibrillation ([facility])    normal LV function; episodes occurred in 205 and 09/2007  . Pedal edema   . Peripheral edema   . PONV (postoperative nausea and vomiting)   . Pulmonary embolism ([place])    2000/09/2008  . Rectal  bleeding   . Sleep apnea   . Thyroid disease    hypothyroidism  . Tobacco user    stopped   . Venous stasis    Assessment: [name] is a 78yo female presenting with tonsillar abscess, Left ear and throat pain. Afib on coumadin PTA.  INR 2.13 on admit. CBC WNL. CHADS2VASC=6. Missed dose 2/20.  - PTA Warfarin: 5mg  MWFSat, 2.5mg  TTSun with admit INR 2.13.  INR still supratherapeutic today at 3.3. We will hold for another day.   Goal of Therapy:  INR 2-3 Monitor platelets by anticoagulation protocol: Yes   Plan:  No coumadin tonight Daily INR  Onnie Boer, PharmD, [place], AAHIVP, CPP Infectious Disease Pharmacist 08/17/2018 12:17 PM

## 2018-08-18 ENCOUNTER — Other Ambulatory Visit: Payer: Self-pay | Admitting: Cardiovascular Disease

## 2018-08-18 ENCOUNTER — Other Ambulatory Visit: Payer: Self-pay | Admitting: Family Medicine

## 2018-08-18 ENCOUNTER — Telehealth: Payer: Self-pay

## 2018-08-18 DIAGNOSIS — M6281 Muscle weakness (generalized): Secondary | ICD-10-CM | POA: Diagnosis not present

## 2018-08-18 DIAGNOSIS — E0822 Diabetes mellitus due to underlying condition with diabetic chronic kidney disease: Secondary | ICD-10-CM | POA: Diagnosis not present

## 2018-08-18 DIAGNOSIS — I4891 Unspecified atrial fibrillation: Secondary | ICD-10-CM | POA: Diagnosis not present

## 2018-08-18 DIAGNOSIS — I2510 Atherosclerotic heart disease of native coronary artery without angina pectoris: Secondary | ICD-10-CM | POA: Diagnosis not present

## 2018-08-18 DIAGNOSIS — R2681 Unsteadiness on feet: Secondary | ICD-10-CM | POA: Diagnosis not present

## 2018-08-18 DIAGNOSIS — R262 Difficulty in walking, not elsewhere classified: Secondary | ICD-10-CM | POA: Diagnosis not present

## 2018-08-18 DIAGNOSIS — I480 Paroxysmal atrial fibrillation: Secondary | ICD-10-CM | POA: Diagnosis not present

## 2018-08-18 DIAGNOSIS — R490 Dysphonia: Secondary | ICD-10-CM | POA: Diagnosis not present

## 2018-08-18 DIAGNOSIS — R109 Unspecified abdominal pain: Secondary | ICD-10-CM | POA: Diagnosis not present

## 2018-08-18 DIAGNOSIS — M797 Fibromyalgia: Secondary | ICD-10-CM | POA: Diagnosis not present

## 2018-08-18 DIAGNOSIS — E118 Type 2 diabetes mellitus with unspecified complications: Secondary | ICD-10-CM | POA: Diagnosis not present

## 2018-08-18 DIAGNOSIS — M353 Polymyalgia rheumatica: Secondary | ICD-10-CM | POA: Diagnosis not present

## 2018-08-18 DIAGNOSIS — E038 Other specified hypothyroidism: Secondary | ICD-10-CM | POA: Diagnosis not present

## 2018-08-18 DIAGNOSIS — R41841 Cognitive communication deficit: Secondary | ICD-10-CM | POA: Diagnosis not present

## 2018-08-18 DIAGNOSIS — M2550 Pain in unspecified joint: Secondary | ICD-10-CM | POA: Diagnosis not present

## 2018-08-18 DIAGNOSIS — I10 Essential (primary) hypertension: Secondary | ICD-10-CM | POA: Diagnosis not present

## 2018-08-18 DIAGNOSIS — E039 Hypothyroidism, unspecified: Secondary | ICD-10-CM | POA: Diagnosis not present

## 2018-08-18 DIAGNOSIS — J381 Polyp of vocal cord and larynx: Secondary | ICD-10-CM | POA: Diagnosis not present

## 2018-08-18 DIAGNOSIS — J36 Peritonsillar abscess: Secondary | ICD-10-CM | POA: Diagnosis not present

## 2018-08-18 DIAGNOSIS — R5381 Other malaise: Secondary | ICD-10-CM | POA: Diagnosis not present

## 2018-08-18 DIAGNOSIS — Z7401 Bed confinement status: Secondary | ICD-10-CM | POA: Diagnosis not present

## 2018-08-18 DIAGNOSIS — E785 Hyperlipidemia, unspecified: Secondary | ICD-10-CM | POA: Diagnosis not present

## 2018-08-18 LAB — PROTIME-INR
INR: 2.5 — ABNORMAL HIGH (ref 0.8–1.2)
Prothrombin Time: 26.9 seconds — ABNORMAL HIGH (ref 11.4–15.2)

## 2018-08-18 LAB — GLUCOSE, CAPILLARY
Glucose-Capillary: 105 mg/dL — ABNORMAL HIGH (ref 70–99)
Glucose-Capillary: 100 mg/dL — ABNORMAL HIGH (ref 70–99)
Glucose-Capillary: 99 mg/dL (ref 70–99)

## 2018-08-18 MED ORDER — INSULIN ASPART 100 UNIT/ML ~~LOC~~ SOLN: SUBCUTANEOUS | 11 refills | Status: DC

## 2018-08-18 MED ORDER — AMOXICILLIN-POT CLAVULANATE 875-125 MG PO TABS: 1.0000 | ORAL_TABLET | Freq: Two times a day (BID) | ORAL | 0 refills | Status: AC

## 2018-08-18 MED ORDER — PANTOPRAZOLE SODIUM 40 MG PO TBEC: 40.0000 mg | DELAYED_RELEASE_TABLET | Freq: Every day | ORAL | 3 refills | Status: DC

## 2018-08-18 MED ORDER — TRIAMCINOLONE ACETONIDE 0.1 % EX CREA: 1.0000 "application " | TOPICAL_CREAM | Freq: Two times a day (BID) | CUTANEOUS | 6 refills | Status: DC

## 2018-08-18 MED ORDER — OXYCODONE HCL 10 MG PO TABS: 10.0000 mg | ORAL_TABLET | Freq: Two times a day (BID) | ORAL | 0 refills | Status: DC | PRN

## 2018-08-18 NOTE — Progress Notes (Signed)
Patient will DC to: [facility] Anticipated DC date: 08/18/2018 Family notified: Patient declined Transport by: Corey Harold   Per MD patient ready for DC to [facility], patient, patient's family, and facility notified of DC. Discharge Summary and FL2 sent to facility. RN to call report prior to discharge [phone] Room A22-2). DC packet on chart. Ambulance transport requested for patient.   CSW will sign off for now as social work intervention is no longer needed. Please consult [place] again if new needs arise.  Cedric Fishman, LCSW Clinical Social Worker [phone]

## 2018-08-18 NOTE — Progress Notes (Signed)
ANTICOAGULATION and ANTIBIOTIC CONSULT NOTE - Initial Consult  Pharmacy Consult for Coumadin Indication: atrial fibrillation  Allergies  Allergen Reactions  . Cefzil [Cefprozil] Nausea And Vomiting  . Demerol Nausea And Vomiting    Patient Measurements: Height: 5\' 1"  (154.9 cm) Weight: 200 lb (90.7 kg) IBW/kg (Calculated) : 47.8 Vital Signs: Temp: 97.7 F (36.5 C) (02/25 0531) Temp Source: Oral (02/25 0531) BP: 149/93 (02/25 0531) Pulse Rate: 65 (02/25 0531)  Labs: Recent Labs    08/16/18 0552 08/17/18 0459 08/18/18 0503  HGB 12.3  --   --   HCT 39.6  --   --   PLT 300  --   --   LABPROT 38.2* 33.0* 26.9*  INR 3.98 3.30 2.5*    Estimated Creatinine Clearance: 58.5 mL/min (by C-G formula based on SCr of 0.77 mg/dL).   Medical History: Past Medical History:  Diagnosis Date  . Allergy   . Allergy history unknown   . Arthritis   . ASCVD (arteriosclerotic cardiovascular disease)    stent to mid and proximal left anterior descending in 06/2002;drug eluting stent placed in the second diagnol in 08/2003 after  A  non-st elevation myocardial infarction   . Asthma   . Atrial fibrillation ([place])   . Chronic anticoagulation   . Chronic nausea   . Chronic pain of left knee   . Coronary artery disease   . Current chronic use of systemic steroids 05/11/2016  . Depression   . Diabetes mellitus    no insulin  . Diarrhea    acute  . Diverticulitis of intestine   . FH: colonic polyps    adenomataous  . GERD (gastroesophageal reflux disease)   . History of kidney stones   . Hyperlipidemia    pulmonary embolism 2000 and 09/2008  . Hypertension   . Hypothyroidism   . Insomnia   . Irritable bowel syndrome   . Myocardial infarction ([place])   . Paroxysmal atrial fibrillation (H[facility])    normal LV function; episodes occurred in 205 and 09/2007  . Pedal edema   . Peripheral edema   . PONV (postoperative nausea and vomiting)   . Pulmonary embolism ([place])    2000/09/2008  . Rectal  bleeding   . Sleep apnea   . Thyroid disease    hypothyroidism  . Tobacco user    stopped   . Venous stasis    Assessment: [name] is a 78yo female presenting with tonsillar abscess, Left ear and throat pain. Afib on coumadin PTA.  INR 2.13 on admit. CBC WNL. CHADS2VASC=6. Missed dose 2/20.  - PTA Warfarin: 5mg  MWFSat, 2.5mg  TTSun with admit INR 2.13.  INR came back therapeutic today. She will be discharged on her home dose of coumadin.  Goal of Therapy:  INR 2-3 Monitor platelets by anticoagulation protocol: Yes   Plan:  Dc on coumadin 2.5mg  TTSun except 5mg  MWFSat Daily INR  Onnie Boer, PharmD, BCIDP, AAHIVP, CPP Infectious Disease Pharmacist 08/18/2018 9:48 AM

## 2018-08-18 NOTE — Telephone Encounter (Signed)
[facility] is requesting refill on Triamcinolone 0.1 % Apply to affected areas bid. 60 grams.  Please advise.

## 2018-08-18 NOTE — Progress Notes (Signed)
Pt given discharge instructions, prescriptions, and care notes. Pt verbalized understanding AEB no further questions or concerns at this time. IV was discontinued, no redness, pain, or swelling noted at this time.  Pt left the floor via wheelchair with staff in stable condition. Skin dry and intact. 

## 2018-08-18 NOTE — Discharge Summary (Addendum)
PATIENT DETAILS Name: [name] Age: 78 y.o. Sex: female Date of Birth: [birth year] MRN: [id number]. Admitting Physician: Tawni Millers, MD [id number], Grace Bushy, MD  Admit Date: 08/13/2018 Discharge date: 08/18/2018  Recommendations for Outpatient Follow-up:  1. Follow up with PCP in 1-2 weeks 2. Please obtain BMP/CBC in one week 3. Please ensure follow-up with ENT within 1 week 4. Please repeat INR in the next 2-3 days to ensure INR in therapeutic range.  Admitted From:  Home  Disposition: SNF   Home Health: No  Equipment/Devices: None  Discharge Condition: Stable  CODE STATUS: FULL CODE  Diet recommendation:  Heart Healthy / Carb Modified-soft diet  Brief Summary: See H&P, Labs, Consult and Test reports for all details in brief, Patient is a 78 y.o. female history of DM-2, hypertension, dyslipidemia, atrial fibrillation on Coumadin-presented with sore throat, left ear pain-found to have left sided tonsillitis-possibly early intratonsillar abscess.  See below for further details  Brief Hospital Course: Left-sided tonsillitis with early intratonsillar abscess:  Admitted and started on IV Unasyn-markedly improved over the course of the past few days.  Had hoarse voice-but voice quality is now back to normal.  Leukocytosis is essentially normalized.  Was evaluated by ENT with recommendations to continue with antimicrobial therapy as she markedly improved.  Her sore throat has improved-by day of discharge she claims she has only minimal sore throat, she has has a enlarged left tonsil still but it is markedly better than what it was on admission.  We will plan on a 2-week course of antimicrobial therapy (10 additional more days), with plans to follow-up with ENT within 1 week.  DM-2: CBG stable, continue SSI.  Hypertension: Controlled-continue losartan and Cardizem.  Follow and optimize  PAF: Rate controlled with Cardizem-INR therapeutic at the time of  discharge.  Resume home regimen of Coumadin-follow INR closely while at SNF.  Hypothyroidism: Continue Synthroid  Depression: Stable-continue Lexapro  Asthma: Lungs clear-stable-continue bronchodilators.  Deconditioning/debility: Secondary to acute illness-PT evaluation completed-recommendations of SNF on discharge.  Procedures/Studies: None  Discharge Diagnoses:  Active Problems:   Hypothyroidism   Controlled type 2 diabetes mellitus ([place])   Hyperlipidemia LDL goal <70   Essential hypertension   Tonsillar abscess   AF (paroxysmal atrial fibrillation) [facility])   Discharge Instructions:  Activity:  As tolerated   Discharge Instructions    Call MD for:  difficulty breathing, headache or visual disturbances   Complete by:  As directed    Call MD for:  persistant nausea and vomiting   Complete by:  As directed    Call MD for:  severe uncontrolled pain   Complete by:  As directed    Call MD for:  temperature >100.4   Complete by:  As directed    Diet - low sodium heart healthy   Complete by:  As directed    Diet Carb Modified   Complete by:  As directed    Discharge instructions   Complete by:  As directed    Follow with Primary MD  Mikey Kirschner, MD in 1 week  FOLLOW WITH ENT IN 2 WEEK  Please get a complete blood count and chemistry panel checked by your Primary MD at your next visit, and again as instructed by your Primary MD.  Get Medicines reviewed and adjusted: Please take all your medications with you for your next visit with your Primary MD  Laboratory/radiological data: Please request your Primary MD to go over all hospital tests and procedure/radiological  results at the follow up, please ask your Primary MD to get all Hospital records sent to his/her office.  In some cases, they will be blood work, cultures and biopsy results pending at the time of your discharge. Please request that your primary care M.D. follows up on these results.  Also Note the  following: If you experience worsening of your admission symptoms, develop shortness of breath, life threatening emergency, suicidal or homicidal thoughts you must seek medical attention immediately by calling 911 or calling your MD immediately  if symptoms less severe.  You must read complete instructions/literature along with all the possible adverse reactions/side effects for all the Medicines you take and that have been prescribed to you. Take any new Medicines after you have completely understood and accpet all the possible adverse reactions/side effects.   Do not drive when taking Pain medications or sleeping medications (Benzodaizepines)  Do not take more than prescribed Pain, Sleep and Anxiety Medications. It is not advisable to combine anxiety,sleep and pain medications without talking with your primary care practitioner  Special Instructions: If you have smoked or chewed Tobacco  in the last 2 yrs please stop smoking, stop any regular Alcohol  and or any Recreational drug use.  Wear Seat belts while driving.  Please note: You were cared for by a hospitalist during your hospital stay. Once you are discharged, your primary care physician will handle any further medical issues. Please note that NO REFILLS for any discharge medications will be authorized once you are discharged, as it is imperative that you return to your primary care physician (or establish a relationship with a primary care physician if you do not have one) for your post hospital discharge needs so that they can reassess your need for medications and monitor your lab values.   Check CBG's AC&HS   Increase activity slowly   Complete by:  As directed      Allergies as of 08/18/2018      Reactions   Cefzil [cefprozil] Nausea And Vomiting   Demerol Nausea And Vomiting      Medication List    STOP taking these medications   amoxicillin 500 MG capsule Commonly known as:  AMOXIL     TAKE these medications     acetaminophen 500 MG tablet Commonly known as:  TYLENOL Take 500 mg by mouth daily as needed for headache.   albuterol (2.5 MG/3ML) 0.083% nebulizer solution Commonly known as:  PROVENTIL Take 3 mLs (2.5 mg total) by nebulization every 6 (six) hours as needed for wheezing or shortness of breath.   albuterol 108 (90 Base) MCG/ACT inhaler Commonly known as:  VENTOLIN HFA INHALE 2 PUFFS INTO THE LUNGS 4 TIMES DAILY AS NEEDED FOR WHEEZING.   amoxicillin-clavulanate 875-125 MG tablet Commonly known as:  AUGMENTIN Take 1 tablet by mouth every 12 (twelve) hours for 10 days.   atorvastatin 40 MG tablet Commonly known as:  LIPITOR TAKE ONE TABLET BY MOUTH DAILY.   blood glucose meter kit and supplies Kit Dispense based on patient and insurance preference. Test once a day. Dx: E11.9   diltiazem 180 MG 24 hr capsule Commonly known as:  CARDIZEM CD TAKE ONE CAPSULE BY MOUTH DAILY.   diphenoxylate-atropine 2.5-0.025 MG tablet Commonly known as:  LOMOTIL TAKE ONE TABLET BY MOUTH 2 TIMES A DAY AS NEEDED.   escitalopram 20 MG tablet Commonly known as:  LEXAPRO TAKE ONE TABLET BY MOUTH ONCE DAILY.   fluticasone 110 MCG/ACT inhaler Commonly known as:  FLOVENT HFA Inhale 2 puffs into the lungs 2 (two) times daily.   furosemide 40 MG tablet Commonly known as:  LASIX TAKE ONE TABLET BY MOUTH DAILY.   insulin aspart 100 UNIT/ML injection Commonly known as:  novoLOG 0-9 Units, Subcutaneous, 3 times daily with meals CBG < 70: implement hypoglycemia protocol CBG 70 - 120: 0 units CBG 121 - 150: 1 unit CBG 151 - 200: 2 units CBG 201 - 250: 3 units CBG 251 - 300: 5 units CBG 301 - 350: 7 units CBG 351 - 400: 9 units CBG > 400   levothyroxine 75 MCG tablet Commonly known as:  SYNTHROID, LEVOTHROID Take 1 tablet (75 mcg total) by mouth daily.   losartan 25 MG tablet Commonly known as:  COZAAR TAKE 1 TABLET BY MOUTH ONCE DAILY.   meclizine 25 MG tablet Commonly known as:   ANTIVERT Take 1 tablet (25 mg total) by mouth 3 (three) times daily as needed for dizziness.   neomycin-polymyxin-hydrocortisone OTIC solution Commonly known as:  CORTISPORIN Place 4 drops into the left ear 4 (four) times daily.   nitroGLYCERIN 0.4 MG/SPRAY spray Commonly known as:  NITROLINGUAL Place 1 spray under the tongue every 5 (five) minutes x 3 doses as needed for chest pain.   ONE TOUCH ULTRA TEST test strip Generic drug:  glucose blood USE TO TEST BLOOD SUGAR ONCE DAILY.   Oxycodone HCl 10 MG Tabs Take 1 tablet (10 mg total) by mouth 2 (two) times daily as needed (FOR PAIN).   pantoprazole 40 MG tablet Commonly known as:  PROTONIX Take 1 tablet (40 mg total) by mouth daily. What changed:  when to take this   Potassium Chloride ER 20 MEQ Tbcr One po daily What changed:    how much to take  how to take this  when to take this   triamcinolone cream 0.1 % Commonly known as:  KENALOG Apply 1 application topically 2 (two) times daily. What changed:    how much to take  how to take this  when to take this  additional instructions   Vitamin D3 25 MCG (1000 UT) Caps Take 1 capsule every evening by mouth.   warfarin 5 MG tablet Commonly known as:  COUMADIN Take as directed. If you are unsure how to take this medication, talk to your nurse or doctor. Original instructions:  TAKE ONE TABLET BY MOUTH ONCE DAILY. TAKE AS DIRECTED BY DOCTOR. What changed:  See the new instructions.       Contact information for follow-up providers    Mikey Kirschner, MD. Schedule an appointment as soon as possible for a visit in 1 week(s).   Specialty:  Family Medicine Contact information: [place] [address] [phone]        Herminio Commons, MD .   Specialty:  Cardiology Contact information: [place] [address] [phone]        Leta Baptist, MD. Schedule an appointment as soon as possible for a visit in 1  week(s).   Specialty:  Otolaryngology Contact information: [address] [place] [address] [phone]            Contact information for after-discharge care    [facility] .   Service:  Skilled Chiropodist information: [address] [place] [phone]  Allergies  Allergen Reactions  . Cefzil [Cefprozil] Nausea And Vomiting  . Demerol Nausea And Vomiting    Consultations:   ENT   Other Procedures/Studies: Ct Soft Tissue Neck W Contrast  Result Date: 08/13/2018 CLINICAL DATA:  Throat pain and difficulty swallowing. EXAM: CT NECK WITH CONTRAST TECHNIQUE: Multidetector CT imaging of the neck was performed using the standard protocol following the bolus administration of intravenous contrast. CONTRAST:  34m OMNIPAQUE IOHEXOL 300 MG/ML  SOLN COMPARISON:  None. FINDINGS: Pharynx and larynx: There is a large tonsillar abscess on the LEFT, roughly 2.2 x 2.1 cm cross-section. There is surrounding LEFT parapharyngeal inflammation, but no frank peritonsillar abscess. There is also abnormal retropharyngeal fluid, possible retropharyngeal abscess, 13 x 7 mm cross-section extending over at least 2-3 vertebral body segments. Abnormal inflammatory change extends into the paraglottic region on the LEFT. This results in significant glottic narrowing. Correlate clinically for symptoms of stridor or airway compromise. Urgent ENT consultation is recommended. Salivary glands: No inflammation, mass, or stone. Thyroid: Normal. Lymph nodes: Reactive cervical lymph nodes. Vascular: Patent. Atherosclerosis. Limited intracranial: Negative. Visualized orbits: Negative. Mastoids and visualized paranasal sinuses: No definite layering sinus fluid. Skeleton: Spondylosis. Upper chest: No mass or pneumothorax. No subglottic narrowing. Other: None. IMPRESSION: 1. Large tonsillar abscess on the LEFT, roughly  2.2 x 2.1 cm cross-section. Surrounding LEFT parapharyngeal inflammation, but no frank peritonsillar abscess. 2. Inflammatory change extends caudally into the LEFT paraglottic space, and there is effacement of the supraglottic airway. Urgent ENT consultation is warranted. 3. Retropharyngeal effusion versus abscess, 13 x 7 mm cross-section extending over at least 2-3 vertebral body segments. 4. These results were called by telephone at the time of interpretation on 08/13/2018 at 2:07 pm to Dr. ZRoderic Palau, who verbally acknowledged these results. Electronically Signed   By: JStaci RighterM.D.   On: 08/13/2018 14:09     TODAY-DAY OF DISCHARGE:  Subjective:   [name] today has no headache,no chest abdominal pain,no new weakness tingling or numbness, feels much better wants to go home today.   Objective:   Blood pressure (!) 149/93, pulse 65, temperature 97.7 F (36.5 C), temperature source Oral, resp. rate 18, height 5' 1"  (1.549 m), weight 90.7 kg, SpO2 96 %.  Intake/Output Summary (Last 24 hours) at 08/18/2018 1335 Last data filed at 08/18/2018 1014 Gross per 24 hour  Intake 480 ml  Output 1250 ml  Net -770 ml   Filed Weights   08/13/18 1005  Weight: 90.7 kg    Exam: Awake Alert, Oriented *3, No new F.N deficits, Normal affect .AT,PERRAL Supple Neck,No JVD, No cervical lymphadenopathy appriciated.  Symmetrical Chest wall movement, Good air movement bilaterally, CTAB RRR,No Gallops,Rubs or new Murmurs, No Parasternal Heave +ve B.Sounds, Abd Soft, Non tender, No organomegaly appriciated, No rebound -guarding or rigidity. No Cyanosis, Clubbing or edema, No new Rash or bruise   PERTINENT RADIOLOGIC STUDIES: Ct Soft Tissue Neck W Contrast  Result Date: 08/13/2018 CLINICAL DATA:  Throat pain and difficulty swallowing. EXAM: CT NECK WITH CONTRAST TECHNIQUE: Multidetector CT imaging of the neck was performed using the standard protocol following the bolus administration of intravenous  contrast. CONTRAST:  741mOMNIPAQUE IOHEXOL 300 MG/ML  SOLN COMPARISON:  None. FINDINGS: Pharynx and larynx: There is a large tonsillar abscess on the LEFT, roughly 2.2 x 2.1 cm cross-section. There is surrounding LEFT parapharyngeal inflammation, but no frank peritonsillar abscess. There is also abnormal retropharyngeal fluid, possible retropharyngeal abscess, 13 x 7 mm cross-section extending over at least  2-3 vertebral body segments. Abnormal inflammatory change extends into the paraglottic region on the LEFT. This results in significant glottic narrowing. Correlate clinically for symptoms of stridor or airway compromise. Urgent ENT consultation is recommended. Salivary glands: No inflammation, mass, or stone. Thyroid: Normal. Lymph nodes: Reactive cervical lymph nodes. Vascular: Patent. Atherosclerosis. Limited intracranial: Negative. Visualized orbits: Negative. Mastoids and visualized paranasal sinuses: No definite layering sinus fluid. Skeleton: Spondylosis. Upper chest: No mass or pneumothorax. No subglottic narrowing. Other: None. IMPRESSION: 1. Large tonsillar abscess on the LEFT, roughly 2.2 x 2.1 cm cross-section. Surrounding LEFT parapharyngeal inflammation, but no frank peritonsillar abscess. 2. Inflammatory change extends caudally into the LEFT paraglottic space, and there is effacement of the supraglottic airway. Urgent ENT consultation is warranted. 3. Retropharyngeal effusion versus abscess, 13 x 7 mm cross-section extending over at least 2-3 vertebral body segments. 4. These results were called by telephone at the time of interpretation on 08/13/2018 at 2:07 pm to Dr. Roderic Palau , who verbally acknowledged these results. Electronically Signed   By: Staci Righter M.D.   On: 08/13/2018 14:09     PERTINENT LAB RESULTS: CBC: Recent Labs    08/16/18 0552  WBC 10.6*  HGB 12.3  HCT 39.6  PLT 300   CMET CMP     Component Value Date/Time   NA 139 08/14/2018 0334   NA 141 12/24/2017 1521   K  3.7 08/14/2018 0334   CL 102 08/14/2018 0334   CO2 25 08/14/2018 0334   GLUCOSE 115 (H) 08/14/2018 0334   BUN 9 08/14/2018 0334   BUN 18 12/24/2017 1521   CREATININE 0.77 08/14/2018 0334   CREATININE 1.50 (H) 02/01/2016 1443   CALCIUM 9.0 08/14/2018 0334   PROT 7.3 04/25/2018 1016   PROT 7.2 11/05/2017 1501   ALBUMIN 3.5 04/25/2018 1016   ALBUMIN 4.0 11/05/2017 1501   AST 23 04/25/2018 1016   ALT 15 04/25/2018 1016   ALKPHOS 68 04/25/2018 1016   BILITOT 0.8 04/25/2018 1016   BILITOT <0.2 11/05/2017 1501   GFRNONAA >60 08/14/2018 0334   GFRAA >60 08/14/2018 0334    GFR Estimated Creatinine Clearance: 58.5 mL/min (by C-G formula based on SCr of 0.77 mg/dL). No results for input(s): LIPASE, AMYLASE in the last 72 hours. No results for input(s): CKTOTAL, CKMB, CKMBINDEX, TROPONINI in the last 72 hours. Invalid input(s): POCBNP No results for input(s): DDIMER in the last 72 hours. No results for input(s): HGBA1C in the last 72 hours. No results for input(s): CHOL, HDL, LDLCALC, TRIG, CHOLHDL, LDLDIRECT in the last 72 hours. No results for input(s): TSH, T4TOTAL, T3FREE, THYROIDAB in the last 72 hours.  Invalid input(s): FREET3 No results for input(s): VITAMINB12, FOLATE, FERRITIN, TIBC, IRON, RETICCTPCT in the last 72 hours. Coags: Recent Labs    08/17/18 0459 08/18/18 0503  INR 3.30 2.5*   Microbiology: No results found for this or any previous visit (from the past 240 hour(s)).  FURTHER DISCHARGE INSTRUCTIONS:  Get Medicines reviewed and adjusted: Please take all your medications with you for your next visit with your Primary MD  Laboratory/radiological data: Please request your Primary MD to go over all hospital tests and procedure/radiological results at the follow up, please ask your Primary MD to get all Hospital records sent to his/her office.  In some cases, they will be blood work, cultures and biopsy results pending at the time of your discharge. Please  request that your primary care M.D. goes through all the records of your hospital data and  follows up on these results.  Also Note the following: If you experience worsening of your admission symptoms, develop shortness of breath, life threatening emergency, suicidal or homicidal thoughts you must seek medical attention immediately by calling 911 or calling your MD immediately  if symptoms less severe.  You must read complete instructions/literature along with all the possible adverse reactions/side effects for all the Medicines you take and that have been prescribed to you. Take any new Medicines after you have completely understood and accpet all the possible adverse reactions/side effects.   Do not drive when taking Pain medications or sleeping medications (Benzodaizepines)  Do not take more than prescribed Pain, Sleep and Anxiety Medications. It is not advisable to combine anxiety,sleep and pain medications without talking with your primary care practitioner  Special Instructions: If you have smoked or chewed Tobacco  in the last 2 yrs please stop smoking, stop any regular Alcohol  and or any Recreational drug use.  Wear Seat belts while driving.  Please note: You were cared for by a hospitalist during your hospital stay. Once you are discharged, your primary care physician will handle any further medical issues. Please note that NO REFILLS for any discharge medications will be authorized once you are discharged, as it is imperative that you return to your primary care physician (or establish a relationship with a primary care physician if you do not have one) for your post hospital discharge needs so that they can reassess your need for medications and monitor your lab values.  Total Time spent coordinating discharge including counseling, education and face to face time equals 35 minutes.  Signed: Oren Binet 08/18/2018 1:35 PM

## 2018-08-18 NOTE — Telephone Encounter (Signed)
Ok six ref 

## 2018-08-18 NOTE — Telephone Encounter (Signed)
Sent to [facility] 

## 2018-08-18 NOTE — Clinical Social Work Placement (Signed)
   CLINICAL SOCIAL WORK PLACEMENT  NOTE  Date:  08/18/2018  Patient Details  Name: [name] MRN: [id number] Date of Birth: [birth year]  Clinical Social Work is seeking post-discharge placement for this patient at the [place] level of care (*CSW will initial, date and re-position this form in  chart as items are completed):  Yes   Patient/family provided with [facility] Work Department's list of facilities offering this level of care within the geographic area requested by the patient (or if unable, by the patient's family).  Yes   Patient/family informed of their freedom to choose among providers that offer the needed level of care, that participate in Medicare, Medicaid or managed care program needed by the patient, have an available bed and are willing to accept the patient.  Yes   Patient/family informed of [facility]'s ownership interest in [facility] and [facility], as well as of the fact that they are under no obligation to receive care at these facilities.  PASRR submitted to EDS on       PASRR number received on       Existing PASRR number confirmed on 08/14/18     FL2 transmitted to all facilities in geographic area requested by pt/family on 08/14/18     FL2 transmitted to all facilities within larger geographic area on       Patient informed that his/her managed care company has contracts with or will negotiate with certain facilities, including the following:        Yes   Patient/family informed of bed offers received.  Patient chooses bed at [facility]     Physician recommends and patient chooses bed at      Patient to be transferred to [facility] on 08/18/18.  Patient to be transferred to facility by [facility]     Patient family notified on 08/18/18 of transfer.  Name of family member notified:  Patient declined     PHYSICIAN       Additional Comment:     _______________________________________________ Benard Halsted, LCSW 08/18/2018, 1:53 PM

## 2018-08-18 NOTE — Consult Note (Signed)
[facility] CM Primary Care Navigator  08/18/2018  [name] 01/15/1941 [id number]   Met withpatientat the bedsidetoidentify possible discharge needs.   Patient reports having "left ear infection down to the throat that caused difficulty to talk and swallow" which had resulted to this admission. (left sided tonsillitis-possibly early intratonsillar abscess, atrial fibrillation, deconditioning/ debility secondary to acute illness)   Patient endorses Dr. William Luking with [facility] as the primary care provider.   Patient statesusing [facility] in [place] to obtain medicationswithout any problem so far.  Patient has been managing her medications at home using "bubble wrap/ blister pack" system filled by pharmacy monthly.   Her friend (Kippy- housekeeper) or her sister in-law (Annie) are providing transportation to her doctors' appointments.  Patient lives alone at home and reports no caregiver but herself. Her friends (Kippy or Rose) provide assistance for her as needed.  Anticipated plan for discharge isskilled nursing facility (SNF)per therapy recommendation. According to patient, plan forlong termplacement after rehab is possible if still not strong enough to care for herself at home.   Patientexpressedunderstandingto callprimarycare provider's office if ever she will return back home, for a post discharge follow- up visitwithin1- 2 weeksor sooner if needs arise.Patient letter (with PCP's contact number) was provided as her reminder.  Explained topatient regarding [facility] CM services available for health management and resources at home,but states for now, unsure of the planat this point. Patient said that her improvement will dictate the plan afterdischargefrom rehab facility. Patient was were encouragedto seek referral from primary care provider to [facility] care management ifdeemed necessary and  appropriatefor any servicesin the near future- if evershe willgetback home.   [facility] care management information was provided for future needs thatshemay have.  Primary care provider's office is listed as providing transition of care (TOC) follow-up.   For additional questions please contact:  Lorraine A. Ajel, BSN, RN-BC [facility] PRIMARY CARE Navigator Cell: [phone] 

## 2018-08-18 NOTE — Progress Notes (Signed)
Fortunato Curling has received Biochemist, clinical.   Cedric Fishman LCSW [phone]

## 2018-08-19 DIAGNOSIS — M797 Fibromyalgia: Secondary | ICD-10-CM | POA: Diagnosis not present

## 2018-08-19 DIAGNOSIS — J36 Peritonsillar abscess: Secondary | ICD-10-CM | POA: Diagnosis not present

## 2018-08-19 DIAGNOSIS — I10 Essential (primary) hypertension: Secondary | ICD-10-CM | POA: Diagnosis not present

## 2018-08-19 DIAGNOSIS — M353 Polymyalgia rheumatica: Secondary | ICD-10-CM | POA: Diagnosis not present

## 2018-08-20 ENCOUNTER — Ambulatory Visit (INDEPENDENT_AMBULATORY_CARE_PROVIDER_SITE_OTHER): Payer: Medicare HMO | Admitting: Otolaryngology

## 2018-08-20 DIAGNOSIS — J36 Peritonsillar abscess: Secondary | ICD-10-CM | POA: Diagnosis not present

## 2018-08-20 DIAGNOSIS — R490 Dysphonia: Secondary | ICD-10-CM | POA: Diagnosis not present

## 2018-08-20 DIAGNOSIS — J381 Polyp of vocal cord and larynx: Secondary | ICD-10-CM | POA: Diagnosis not present

## 2018-09-09 DIAGNOSIS — K589 Irritable bowel syndrome without diarrhea: Secondary | ICD-10-CM | POA: Diagnosis not present

## 2018-09-09 DIAGNOSIS — J36 Peritonsillar abscess: Secondary | ICD-10-CM | POA: Diagnosis not present

## 2018-09-09 DIAGNOSIS — J45909 Unspecified asthma, uncomplicated: Secondary | ICD-10-CM | POA: Diagnosis not present

## 2018-09-16 DIAGNOSIS — I480 Paroxysmal atrial fibrillation: Secondary | ICD-10-CM | POA: Diagnosis not present

## 2018-09-17 DIAGNOSIS — I480 Paroxysmal atrial fibrillation: Secondary | ICD-10-CM | POA: Diagnosis not present

## 2018-09-18 DIAGNOSIS — I480 Paroxysmal atrial fibrillation: Secondary | ICD-10-CM | POA: Diagnosis not present

## 2018-09-21 DIAGNOSIS — I119 Hypertensive heart disease without heart failure: Secondary | ICD-10-CM | POA: Diagnosis not present

## 2018-09-21 DIAGNOSIS — Z7901 Long term (current) use of anticoagulants: Secondary | ICD-10-CM | POA: Diagnosis not present

## 2018-09-21 DIAGNOSIS — I480 Paroxysmal atrial fibrillation: Secondary | ICD-10-CM | POA: Diagnosis not present

## 2018-09-21 DIAGNOSIS — E119 Type 2 diabetes mellitus without complications: Secondary | ICD-10-CM | POA: Diagnosis not present

## 2018-09-22 DIAGNOSIS — I480 Paroxysmal atrial fibrillation: Secondary | ICD-10-CM | POA: Diagnosis not present

## 2018-09-23 DIAGNOSIS — I480 Paroxysmal atrial fibrillation: Secondary | ICD-10-CM | POA: Diagnosis not present

## 2018-09-23 DIAGNOSIS — M6281 Muscle weakness (generalized): Secondary | ICD-10-CM | POA: Diagnosis not present

## 2018-09-23 DIAGNOSIS — I4891 Unspecified atrial fibrillation: Secondary | ICD-10-CM | POA: Diagnosis not present

## 2018-09-24 DIAGNOSIS — Z7901 Long term (current) use of anticoagulants: Secondary | ICD-10-CM | POA: Diagnosis not present

## 2018-09-24 DIAGNOSIS — I4891 Unspecified atrial fibrillation: Secondary | ICD-10-CM | POA: Diagnosis not present

## 2018-09-24 DIAGNOSIS — I480 Paroxysmal atrial fibrillation: Secondary | ICD-10-CM | POA: Diagnosis not present

## 2018-09-24 DIAGNOSIS — Z79899 Other long term (current) drug therapy: Secondary | ICD-10-CM | POA: Diagnosis not present

## 2018-09-24 DIAGNOSIS — M6281 Muscle weakness (generalized): Secondary | ICD-10-CM | POA: Diagnosis not present

## 2018-09-25 DIAGNOSIS — I480 Paroxysmal atrial fibrillation: Secondary | ICD-10-CM | POA: Diagnosis not present

## 2018-09-25 DIAGNOSIS — I4891 Unspecified atrial fibrillation: Secondary | ICD-10-CM | POA: Diagnosis not present

## 2018-09-25 DIAGNOSIS — M6281 Muscle weakness (generalized): Secondary | ICD-10-CM | POA: Diagnosis not present

## 2018-09-28 DIAGNOSIS — I4891 Unspecified atrial fibrillation: Secondary | ICD-10-CM | POA: Diagnosis not present

## 2018-09-28 DIAGNOSIS — M6281 Muscle weakness (generalized): Secondary | ICD-10-CM | POA: Diagnosis not present

## 2018-09-28 DIAGNOSIS — Z79899 Other long term (current) drug therapy: Secondary | ICD-10-CM | POA: Diagnosis not present

## 2018-09-28 DIAGNOSIS — Z7901 Long term (current) use of anticoagulants: Secondary | ICD-10-CM | POA: Diagnosis not present

## 2018-09-28 DIAGNOSIS — I480 Paroxysmal atrial fibrillation: Secondary | ICD-10-CM | POA: Diagnosis not present

## 2018-09-29 DIAGNOSIS — I4891 Unspecified atrial fibrillation: Secondary | ICD-10-CM | POA: Diagnosis not present

## 2018-09-29 DIAGNOSIS — M6281 Muscle weakness (generalized): Secondary | ICD-10-CM | POA: Diagnosis not present

## 2018-09-29 DIAGNOSIS — I480 Paroxysmal atrial fibrillation: Secondary | ICD-10-CM | POA: Diagnosis not present

## 2018-09-30 DIAGNOSIS — I480 Paroxysmal atrial fibrillation: Secondary | ICD-10-CM | POA: Diagnosis not present

## 2018-09-30 DIAGNOSIS — I4891 Unspecified atrial fibrillation: Secondary | ICD-10-CM | POA: Diagnosis not present

## 2018-09-30 DIAGNOSIS — M6281 Muscle weakness (generalized): Secondary | ICD-10-CM | POA: Diagnosis not present

## 2018-10-01 DIAGNOSIS — I4891 Unspecified atrial fibrillation: Secondary | ICD-10-CM | POA: Diagnosis not present

## 2018-10-01 DIAGNOSIS — I480 Paroxysmal atrial fibrillation: Secondary | ICD-10-CM | POA: Diagnosis not present

## 2018-10-01 DIAGNOSIS — M6281 Muscle weakness (generalized): Secondary | ICD-10-CM | POA: Diagnosis not present

## 2018-10-02 DIAGNOSIS — I480 Paroxysmal atrial fibrillation: Secondary | ICD-10-CM | POA: Diagnosis not present

## 2018-10-02 DIAGNOSIS — M6281 Muscle weakness (generalized): Secondary | ICD-10-CM | POA: Diagnosis not present

## 2018-10-02 DIAGNOSIS — I4891 Unspecified atrial fibrillation: Secondary | ICD-10-CM | POA: Diagnosis not present

## 2018-10-05 DIAGNOSIS — I4891 Unspecified atrial fibrillation: Secondary | ICD-10-CM | POA: Diagnosis not present

## 2018-10-05 DIAGNOSIS — I480 Paroxysmal atrial fibrillation: Secondary | ICD-10-CM | POA: Diagnosis not present

## 2018-10-05 DIAGNOSIS — M6281 Muscle weakness (generalized): Secondary | ICD-10-CM | POA: Diagnosis not present

## 2018-10-06 DIAGNOSIS — I4891 Unspecified atrial fibrillation: Secondary | ICD-10-CM | POA: Diagnosis not present

## 2018-10-06 DIAGNOSIS — I480 Paroxysmal atrial fibrillation: Secondary | ICD-10-CM | POA: Diagnosis not present

## 2018-10-06 DIAGNOSIS — M6281 Muscle weakness (generalized): Secondary | ICD-10-CM | POA: Diagnosis not present

## 2018-10-07 DIAGNOSIS — I480 Paroxysmal atrial fibrillation: Secondary | ICD-10-CM | POA: Diagnosis not present

## 2018-10-07 DIAGNOSIS — M6281 Muscle weakness (generalized): Secondary | ICD-10-CM | POA: Diagnosis not present

## 2018-10-07 DIAGNOSIS — I4891 Unspecified atrial fibrillation: Secondary | ICD-10-CM | POA: Diagnosis not present

## 2018-10-08 DIAGNOSIS — I480 Paroxysmal atrial fibrillation: Secondary | ICD-10-CM | POA: Diagnosis not present

## 2018-10-08 DIAGNOSIS — Z7901 Long term (current) use of anticoagulants: Secondary | ICD-10-CM | POA: Diagnosis not present

## 2018-10-08 DIAGNOSIS — M6281 Muscle weakness (generalized): Secondary | ICD-10-CM | POA: Diagnosis not present

## 2018-10-08 DIAGNOSIS — I4891 Unspecified atrial fibrillation: Secondary | ICD-10-CM | POA: Diagnosis not present

## 2018-10-08 DIAGNOSIS — K219 Gastro-esophageal reflux disease without esophagitis: Secondary | ICD-10-CM | POA: Diagnosis not present

## 2018-10-08 DIAGNOSIS — Z79899 Other long term (current) drug therapy: Secondary | ICD-10-CM | POA: Diagnosis not present

## 2018-10-09 DIAGNOSIS — M6281 Muscle weakness (generalized): Secondary | ICD-10-CM | POA: Diagnosis not present

## 2018-10-09 DIAGNOSIS — I4891 Unspecified atrial fibrillation: Secondary | ICD-10-CM | POA: Diagnosis not present

## 2018-10-09 DIAGNOSIS — I480 Paroxysmal atrial fibrillation: Secondary | ICD-10-CM | POA: Diagnosis not present

## 2018-10-12 DIAGNOSIS — M6281 Muscle weakness (generalized): Secondary | ICD-10-CM | POA: Diagnosis not present

## 2018-10-12 DIAGNOSIS — I4891 Unspecified atrial fibrillation: Secondary | ICD-10-CM | POA: Diagnosis not present

## 2018-10-12 DIAGNOSIS — Z79899 Other long term (current) drug therapy: Secondary | ICD-10-CM | POA: Diagnosis not present

## 2018-10-12 DIAGNOSIS — I480 Paroxysmal atrial fibrillation: Secondary | ICD-10-CM | POA: Diagnosis not present

## 2018-10-12 DIAGNOSIS — Z7901 Long term (current) use of anticoagulants: Secondary | ICD-10-CM | POA: Diagnosis not present

## 2018-10-13 DIAGNOSIS — I480 Paroxysmal atrial fibrillation: Secondary | ICD-10-CM | POA: Diagnosis not present

## 2018-10-13 DIAGNOSIS — I4891 Unspecified atrial fibrillation: Secondary | ICD-10-CM | POA: Diagnosis not present

## 2018-10-13 DIAGNOSIS — M6281 Muscle weakness (generalized): Secondary | ICD-10-CM | POA: Diagnosis not present

## 2018-10-14 DIAGNOSIS — I4891 Unspecified atrial fibrillation: Secondary | ICD-10-CM | POA: Diagnosis not present

## 2018-10-14 DIAGNOSIS — M6281 Muscle weakness (generalized): Secondary | ICD-10-CM | POA: Diagnosis not present

## 2018-10-14 DIAGNOSIS — I480 Paroxysmal atrial fibrillation: Secondary | ICD-10-CM | POA: Diagnosis not present

## 2018-10-15 DIAGNOSIS — M6281 Muscle weakness (generalized): Secondary | ICD-10-CM | POA: Diagnosis not present

## 2018-10-15 DIAGNOSIS — Z79899 Other long term (current) drug therapy: Secondary | ICD-10-CM | POA: Diagnosis not present

## 2018-10-15 DIAGNOSIS — I480 Paroxysmal atrial fibrillation: Secondary | ICD-10-CM | POA: Diagnosis not present

## 2018-10-15 DIAGNOSIS — I4891 Unspecified atrial fibrillation: Secondary | ICD-10-CM | POA: Diagnosis not present

## 2018-10-15 DIAGNOSIS — Z7901 Long term (current) use of anticoagulants: Secondary | ICD-10-CM | POA: Diagnosis not present

## 2018-10-16 DIAGNOSIS — M6281 Muscle weakness (generalized): Secondary | ICD-10-CM | POA: Diagnosis not present

## 2018-10-16 DIAGNOSIS — I480 Paroxysmal atrial fibrillation: Secondary | ICD-10-CM | POA: Diagnosis not present

## 2018-10-16 DIAGNOSIS — I4891 Unspecified atrial fibrillation: Secondary | ICD-10-CM | POA: Diagnosis not present

## 2018-10-19 DIAGNOSIS — I480 Paroxysmal atrial fibrillation: Secondary | ICD-10-CM | POA: Diagnosis not present

## 2018-10-19 DIAGNOSIS — Z79899 Other long term (current) drug therapy: Secondary | ICD-10-CM | POA: Diagnosis not present

## 2018-10-19 DIAGNOSIS — Z7901 Long term (current) use of anticoagulants: Secondary | ICD-10-CM | POA: Diagnosis not present

## 2018-10-19 DIAGNOSIS — I4891 Unspecified atrial fibrillation: Secondary | ICD-10-CM | POA: Diagnosis not present

## 2018-10-19 DIAGNOSIS — M6281 Muscle weakness (generalized): Secondary | ICD-10-CM | POA: Diagnosis not present

## 2018-10-20 DIAGNOSIS — I480 Paroxysmal atrial fibrillation: Secondary | ICD-10-CM | POA: Diagnosis not present

## 2018-10-20 DIAGNOSIS — M6281 Muscle weakness (generalized): Secondary | ICD-10-CM | POA: Diagnosis not present

## 2018-10-20 DIAGNOSIS — I4891 Unspecified atrial fibrillation: Secondary | ICD-10-CM | POA: Diagnosis not present

## 2018-10-21 DIAGNOSIS — M6281 Muscle weakness (generalized): Secondary | ICD-10-CM | POA: Diagnosis not present

## 2018-10-21 DIAGNOSIS — I480 Paroxysmal atrial fibrillation: Secondary | ICD-10-CM | POA: Diagnosis not present

## 2018-10-21 DIAGNOSIS — I4891 Unspecified atrial fibrillation: Secondary | ICD-10-CM | POA: Diagnosis not present

## 2018-10-26 DIAGNOSIS — I4891 Unspecified atrial fibrillation: Secondary | ICD-10-CM | POA: Diagnosis not present

## 2018-10-26 DIAGNOSIS — Z79899 Other long term (current) drug therapy: Secondary | ICD-10-CM | POA: Diagnosis not present

## 2018-10-26 DIAGNOSIS — I480 Paroxysmal atrial fibrillation: Secondary | ICD-10-CM | POA: Diagnosis not present

## 2018-10-26 DIAGNOSIS — Z7901 Long term (current) use of anticoagulants: Secondary | ICD-10-CM | POA: Diagnosis not present

## 2018-10-28 DIAGNOSIS — J45909 Unspecified asthma, uncomplicated: Secondary | ICD-10-CM | POA: Diagnosis not present

## 2018-10-29 ENCOUNTER — Other Ambulatory Visit (HOSPITAL_COMMUNITY): Payer: Self-pay | Admitting: Nurse Practitioner

## 2018-10-29 DIAGNOSIS — N6320 Unspecified lump in the left breast, unspecified quadrant: Secondary | ICD-10-CM

## 2018-10-29 DIAGNOSIS — I4891 Unspecified atrial fibrillation: Secondary | ICD-10-CM | POA: Diagnosis not present

## 2018-10-29 DIAGNOSIS — I480 Paroxysmal atrial fibrillation: Secondary | ICD-10-CM | POA: Diagnosis not present

## 2018-10-29 DIAGNOSIS — Z79899 Other long term (current) drug therapy: Secondary | ICD-10-CM | POA: Diagnosis not present

## 2018-10-29 DIAGNOSIS — Z7901 Long term (current) use of anticoagulants: Secondary | ICD-10-CM | POA: Diagnosis not present

## 2018-11-02 DIAGNOSIS — I480 Paroxysmal atrial fibrillation: Secondary | ICD-10-CM | POA: Diagnosis not present

## 2018-11-02 DIAGNOSIS — I4891 Unspecified atrial fibrillation: Secondary | ICD-10-CM | POA: Diagnosis not present

## 2018-11-02 DIAGNOSIS — Z7901 Long term (current) use of anticoagulants: Secondary | ICD-10-CM | POA: Diagnosis not present

## 2018-11-02 DIAGNOSIS — Z79899 Other long term (current) drug therapy: Secondary | ICD-10-CM | POA: Diagnosis not present

## 2018-11-04 DIAGNOSIS — I10 Essential (primary) hypertension: Secondary | ICD-10-CM | POA: Diagnosis not present

## 2018-11-04 DIAGNOSIS — I8290 Acute embolism and thrombosis of unspecified vein: Secondary | ICD-10-CM | POA: Diagnosis not present

## 2018-11-04 DIAGNOSIS — R69 Illness, unspecified: Secondary | ICD-10-CM | POA: Diagnosis not present

## 2018-11-05 DIAGNOSIS — Z79899 Other long term (current) drug therapy: Secondary | ICD-10-CM | POA: Diagnosis not present

## 2018-11-05 DIAGNOSIS — I4891 Unspecified atrial fibrillation: Secondary | ICD-10-CM | POA: Diagnosis not present

## 2018-11-05 DIAGNOSIS — Z7901 Long term (current) use of anticoagulants: Secondary | ICD-10-CM | POA: Diagnosis not present

## 2018-11-05 DIAGNOSIS — R143 Flatulence: Secondary | ICD-10-CM | POA: Diagnosis not present

## 2018-11-05 DIAGNOSIS — I480 Paroxysmal atrial fibrillation: Secondary | ICD-10-CM | POA: Diagnosis not present

## 2018-11-09 DIAGNOSIS — R143 Flatulence: Secondary | ICD-10-CM | POA: Diagnosis not present

## 2018-11-09 DIAGNOSIS — Z7901 Long term (current) use of anticoagulants: Secondary | ICD-10-CM | POA: Diagnosis not present

## 2018-11-09 DIAGNOSIS — I4891 Unspecified atrial fibrillation: Secondary | ICD-10-CM | POA: Diagnosis not present

## 2018-11-09 DIAGNOSIS — I480 Paroxysmal atrial fibrillation: Secondary | ICD-10-CM | POA: Diagnosis not present

## 2018-11-09 DIAGNOSIS — Z79899 Other long term (current) drug therapy: Secondary | ICD-10-CM | POA: Diagnosis not present

## 2018-11-10 ENCOUNTER — Ambulatory Visit: Payer: Medicare HMO | Admitting: Family Medicine

## 2018-11-10 DIAGNOSIS — I480 Paroxysmal atrial fibrillation: Secondary | ICD-10-CM | POA: Diagnosis not present

## 2018-11-10 DIAGNOSIS — I4891 Unspecified atrial fibrillation: Secondary | ICD-10-CM | POA: Diagnosis not present

## 2018-11-11 DIAGNOSIS — I4891 Unspecified atrial fibrillation: Secondary | ICD-10-CM | POA: Diagnosis not present

## 2018-11-11 DIAGNOSIS — I480 Paroxysmal atrial fibrillation: Secondary | ICD-10-CM | POA: Diagnosis not present

## 2018-11-12 DIAGNOSIS — R110 Nausea: Secondary | ICD-10-CM | POA: Diagnosis not present

## 2018-11-12 DIAGNOSIS — Z7901 Long term (current) use of anticoagulants: Secondary | ICD-10-CM | POA: Diagnosis not present

## 2018-11-12 DIAGNOSIS — I4891 Unspecified atrial fibrillation: Secondary | ICD-10-CM | POA: Diagnosis not present

## 2018-11-12 DIAGNOSIS — Z79899 Other long term (current) drug therapy: Secondary | ICD-10-CM | POA: Diagnosis not present

## 2018-11-12 DIAGNOSIS — I480 Paroxysmal atrial fibrillation: Secondary | ICD-10-CM | POA: Diagnosis not present

## 2018-11-16 DIAGNOSIS — I480 Paroxysmal atrial fibrillation: Secondary | ICD-10-CM | POA: Diagnosis not present

## 2018-11-16 DIAGNOSIS — I4891 Unspecified atrial fibrillation: Secondary | ICD-10-CM | POA: Diagnosis not present

## 2018-11-19 DIAGNOSIS — I4891 Unspecified atrial fibrillation: Secondary | ICD-10-CM | POA: Diagnosis not present

## 2018-11-19 DIAGNOSIS — G4733 Obstructive sleep apnea (adult) (pediatric): Secondary | ICD-10-CM | POA: Diagnosis not present

## 2018-11-19 DIAGNOSIS — Z7901 Long term (current) use of anticoagulants: Secondary | ICD-10-CM | POA: Diagnosis not present

## 2018-11-19 DIAGNOSIS — I480 Paroxysmal atrial fibrillation: Secondary | ICD-10-CM | POA: Diagnosis not present

## 2018-11-19 DIAGNOSIS — Z79899 Other long term (current) drug therapy: Secondary | ICD-10-CM | POA: Diagnosis not present

## 2018-11-23 DIAGNOSIS — Z7901 Long term (current) use of anticoagulants: Secondary | ICD-10-CM | POA: Diagnosis not present

## 2018-11-23 DIAGNOSIS — I480 Paroxysmal atrial fibrillation: Secondary | ICD-10-CM | POA: Diagnosis not present

## 2018-11-23 DIAGNOSIS — Z79899 Other long term (current) drug therapy: Secondary | ICD-10-CM | POA: Diagnosis not present

## 2018-11-23 DIAGNOSIS — I4891 Unspecified atrial fibrillation: Secondary | ICD-10-CM | POA: Diagnosis not present

## 2018-11-24 ENCOUNTER — Ambulatory Visit (HOSPITAL_COMMUNITY): Payer: Medicare HMO

## 2018-11-24 ENCOUNTER — Other Ambulatory Visit: Payer: Self-pay

## 2018-11-24 ENCOUNTER — Ambulatory Visit (HOSPITAL_COMMUNITY)
Admission: RE | Admit: 2018-11-24 | Discharge: 2018-11-24 | Disposition: A | Discharge status: Home / Self Care | Payer: Medicare HMO | Source: Ambulatory Visit | Attending: Family Medicine | Admitting: Family Medicine

## 2018-11-24 ENCOUNTER — Ambulatory Visit (HOSPITAL_COMMUNITY): Admission: RE | Admit: 2018-11-24 | Payer: Medicare HMO | Source: Ambulatory Visit

## 2018-11-24 ENCOUNTER — Encounter (HOSPITAL_COMMUNITY): Payer: Medicare HMO

## 2018-11-24 ENCOUNTER — Telehealth: Payer: Self-pay | Admitting: Family Medicine

## 2018-11-24 DIAGNOSIS — N641 Fat necrosis of breast: Secondary | ICD-10-CM | POA: Diagnosis not present

## 2018-11-24 DIAGNOSIS — R928 Other abnormal and inconclusive findings on diagnostic imaging of breast: Secondary | ICD-10-CM | POA: Diagnosis not present

## 2018-11-24 DIAGNOSIS — N6320 Unspecified lump in the left breast, unspecified quadrant: Secondary | ICD-10-CM | POA: Diagnosis not present

## 2018-11-24 DIAGNOSIS — N6002 Solitary cyst of left breast: Secondary | ICD-10-CM | POA: Diagnosis not present

## 2018-11-24 NOTE — Telephone Encounter (Signed)
Please see mammogram result this was sent to me thank you May or may not need follow-up office visit

## 2018-11-30 DIAGNOSIS — Z7901 Long term (current) use of anticoagulants: Secondary | ICD-10-CM | POA: Diagnosis not present

## 2018-11-30 DIAGNOSIS — I4891 Unspecified atrial fibrillation: Secondary | ICD-10-CM | POA: Diagnosis not present

## 2018-11-30 DIAGNOSIS — I480 Paroxysmal atrial fibrillation: Secondary | ICD-10-CM | POA: Diagnosis not present

## 2018-11-30 DIAGNOSIS — Z79899 Other long term (current) drug therapy: Secondary | ICD-10-CM | POA: Diagnosis not present

## 2018-12-03 DIAGNOSIS — B373 Candidiasis of vulva and vagina: Secondary | ICD-10-CM | POA: Diagnosis not present

## 2018-12-03 DIAGNOSIS — I480 Paroxysmal atrial fibrillation: Secondary | ICD-10-CM | POA: Diagnosis not present

## 2018-12-03 DIAGNOSIS — Z7901 Long term (current) use of anticoagulants: Secondary | ICD-10-CM | POA: Diagnosis not present

## 2018-12-03 DIAGNOSIS — Z79899 Other long term (current) drug therapy: Secondary | ICD-10-CM | POA: Diagnosis not present

## 2018-12-07 DIAGNOSIS — I4891 Unspecified atrial fibrillation: Secondary | ICD-10-CM | POA: Diagnosis not present

## 2018-12-07 DIAGNOSIS — Z79899 Other long term (current) drug therapy: Secondary | ICD-10-CM | POA: Diagnosis not present

## 2018-12-07 DIAGNOSIS — Z7901 Long term (current) use of anticoagulants: Secondary | ICD-10-CM | POA: Diagnosis not present

## 2018-12-07 DIAGNOSIS — K589 Irritable bowel syndrome without diarrhea: Secondary | ICD-10-CM | POA: Diagnosis not present

## 2018-12-07 DIAGNOSIS — I10 Essential (primary) hypertension: Secondary | ICD-10-CM | POA: Diagnosis not present

## 2018-12-10 DIAGNOSIS — Z7901 Long term (current) use of anticoagulants: Secondary | ICD-10-CM | POA: Diagnosis not present

## 2018-12-10 DIAGNOSIS — Z79899 Other long term (current) drug therapy: Secondary | ICD-10-CM | POA: Diagnosis not present

## 2018-12-10 DIAGNOSIS — I480 Paroxysmal atrial fibrillation: Secondary | ICD-10-CM | POA: Diagnosis not present

## 2018-12-10 DIAGNOSIS — I4891 Unspecified atrial fibrillation: Secondary | ICD-10-CM | POA: Diagnosis not present

## 2018-12-10 DIAGNOSIS — L209 Atopic dermatitis, unspecified: Secondary | ICD-10-CM | POA: Diagnosis not present

## 2018-12-16 DIAGNOSIS — J36 Peritonsillar abscess: Secondary | ICD-10-CM | POA: Diagnosis not present

## 2018-12-16 DIAGNOSIS — I8290 Acute embolism and thrombosis of unspecified vein: Secondary | ICD-10-CM | POA: Diagnosis not present

## 2018-12-16 DIAGNOSIS — K589 Irritable bowel syndrome without diarrhea: Secondary | ICD-10-CM | POA: Diagnosis not present

## 2018-12-17 DIAGNOSIS — B373 Candidiasis of vulva and vagina: Secondary | ICD-10-CM | POA: Diagnosis not present

## 2018-12-17 DIAGNOSIS — I480 Paroxysmal atrial fibrillation: Secondary | ICD-10-CM | POA: Diagnosis not present

## 2018-12-17 DIAGNOSIS — Z7901 Long term (current) use of anticoagulants: Secondary | ICD-10-CM | POA: Diagnosis not present

## 2018-12-17 DIAGNOSIS — Z79899 Other long term (current) drug therapy: Secondary | ICD-10-CM | POA: Diagnosis not present

## 2018-12-17 DIAGNOSIS — I4891 Unspecified atrial fibrillation: Secondary | ICD-10-CM | POA: Diagnosis not present

## 2018-12-22 ENCOUNTER — Ambulatory Visit (INDEPENDENT_AMBULATORY_CARE_PROVIDER_SITE_OTHER): Payer: Medicare HMO | Admitting: Orthopaedic Surgery

## 2018-12-22 ENCOUNTER — Other Ambulatory Visit: Payer: Self-pay

## 2018-12-22 ENCOUNTER — Encounter: Payer: Self-pay | Admitting: Orthopaedic Surgery

## 2018-12-22 VITALS — BP 116/82 | HR 82

## 2018-12-22 DIAGNOSIS — M1712 Unilateral primary osteoarthritis, left knee: Secondary | ICD-10-CM | POA: Diagnosis not present

## 2018-12-22 DIAGNOSIS — M25561 Pain in right knee: Secondary | ICD-10-CM

## 2018-12-22 DIAGNOSIS — M1711 Unilateral primary osteoarthritis, right knee: Secondary | ICD-10-CM | POA: Diagnosis not present

## 2018-12-22 DIAGNOSIS — M25562 Pain in left knee: Secondary | ICD-10-CM

## 2018-12-22 DIAGNOSIS — G8929 Other chronic pain: Secondary | ICD-10-CM

## 2018-12-22 NOTE — Progress Notes (Signed)
Subjective:    Patient ID: [name], female    DOB: [birth year], 78 y.o.   MRN: [id number]  HPI She has long term chronic knee pain of both knees, more on the left. She has no new trauma. She is a resident at [facility].  She has not had recent x-rays. She has swelling and popping of the knees, pain with ambulation. She has no recent trauma.  She has limited ambulation and has been that way for years     Review of Systems  Constitutional: Positive for activity change.  Respiratory: Positive for shortness of breath.   Endocrine: Positive for cold intolerance and heat intolerance.  Musculoskeletal: Positive for arthralgias, gait problem, joint swelling and myalgias.  Allergic/Immunologic: Positive for environmental allergies and food allergies.  All other systems reviewed and are negative.  For Review of Systems, all other systems reviewed and are negative.  The following is a summary of the past history medically, past history surgically, known current medicines, social history and family history.  This information is gathered electronically by the computer from prior information and documentation.  I review this each visit and have found including this information at this point in the chart is beneficial and informative.   Past Medical History:  Diagnosis Date  . Allergy   . Allergy history unknown   . Arthritis   . ASCVD (arteriosclerotic cardiovascular disease)    stent to mid and proximal left anterior descending in 06/2002;drug eluting stent placed in the second diagnol in 08/2003 after  A  non-st elevation myocardial infarction   . Asthma   . Atrial fibrillation ([place])   . Chronic anticoagulation   . Chronic nausea   . Chronic pain of left knee   . Coronary artery disease   . Current chronic use of systemic steroids 05/11/2016  . Depression   . Diabetes mellitus    no insulin  . Diarrhea    acute  . Diverticulitis of intestine   . FH: colonic polyps    adenomataous  . GERD (gastroesophageal reflux disease)   . History of kidney stones   . Hyperlipidemia    pulmonary embolism 2000 and 09/2008  . Hypertension   . Hypothyroidism   . Insomnia   . Irritable bowel syndrome   . Myocardial infarction ([place])   . Paroxysmal atrial fibrillation ([facility])    normal LV function; episodes occurred in 205 and 09/2007  . Pedal edema   . Peripheral edema   . PONV (postoperative nausea and vomiting)   . Pulmonary embolism ([place])    2000/09/2008  . Rectal bleeding   . Sleep apnea   . Thyroid disease    hypothyroidism  . Tobacco user    stopped   . Venous stasis     Past Surgical History:  Procedure Laterality Date  . ABDOMINAL HYSTERECTOMY    . APPENDECTOMY    . BALLOON DILATION N/A 11/11/2013   Procedure: BALLOON DILATION;  Surgeon: Rogene Houston, MD;  [facility];  Service: Endoscopy;  Laterality: N/A;  . BREAST REDUCTION SURGERY    . BREAST SURGERY    . CHOLECYSTECTOMY    . CLEFT PALATE REPAIR     4 surgeries  . COLONOSCOPY  2011   negative  . COLONOSCOPY N/A 05/10/2014   Procedure: COLONOSCOPY;  Surgeon: Rogene Houston, MD;  [facility];  Service: Endoscopy;  Laterality: N/A;  . ESOPHAGEAL DILATION N/A 03/08/2016   Procedure: ESOPHAGEAL DILATION;  Surgeon: Rogene Houston, MD;  [facility];  Service: Endoscopy;  Laterality: N/A;  . ESOPHAGOGASTRODUODENOSCOPY N/A 11/11/2013   Procedure: ESOPHAGOGASTRODUODENOSCOPY (EGD);  Surgeon: Rogene Houston, MD;  [facility];  Service: Endoscopy;  Laterality: N/A;  200-moved to 100 Ann notified pt  . ESOPHAGOGASTRODUODENOSCOPY N/A 05/09/2014   Procedure: ESOPHAGOGASTRODUODENOSCOPY (EGD);  Surgeon: Rogene Houston, MD;  [facility];  Service: Endoscopy;  Laterality: N/A;  . ESOPHAGOGASTRODUODENOSCOPY N/A 03/08/2016   Procedure: ESOPHAGOGASTRODUODENOSCOPY (EGD);  Surgeon: Rogene Houston, MD;  [facility];  Service: Endoscopy;   Laterality: N/A;  2:20  . HEEL SPUR SURGERY    . KNEE SURGERY    . MALONEY DILATION N/A 11/11/2013   Procedure: Venia Minks DILATION;  Surgeon: Rogene Houston, MD;  [facility];  Service: Endoscopy;  Laterality: N/A;  . OOPHORECTOMY    . SAVORY DILATION N/A 11/11/2013   Procedure: SAVORY DILATION;  Surgeon: Rogene Houston, MD;  [facility];  Service: Endoscopy;  Laterality: N/A;  . TONSILLECTOMY      Current Outpatient Medications on File Prior to Visit  Medication Sig Dispense Refill  . acetaminophen (TYLENOL) 500 MG tablet Take 500 mg by mouth daily as needed for headache.    . albuterol (PROVENTIL) (2.5 MG/3ML) 0.083% nebulizer solution Take 3 mLs (2.5 mg total) by nebulization every 6 (six) hours as needed for wheezing or shortness of breath. 75 mL 5  . albuterol (VENTOLIN HFA) 108 (90 Base) MCG/ACT inhaler INHALE 2 PUFFS INTO THE LUNGS 4 TIMES DAILY AS NEEDED FOR WHEEZING. 18 g 5  . atorvastatin (LIPITOR) 40 MG tablet TAKE ONE TABLET BY MOUTH DAILY. 30 tablet 3  . blood glucose meter kit and supplies KIT Dispense based on patient and insurance preference. Test once a day. Dx: E11.9 1 each 0  . Cholecalciferol (VITAMIN D3) 1000 units CAPS Take 1 capsule every evening by mouth.     . diltiazem (CARDIZEM CD) 180 MG 24 hr capsule TAKE ONE CAPSULE BY MOUTH DAILY. 30 capsule 11  . diphenoxylate-atropine (LOMOTIL) 2.5-0.025 MG tablet TAKE ONE TABLET BY MOUTH 2 TIMES A DAY AS NEEDED. 60 tablet 0  . escitalopram (LEXAPRO) 20 MG tablet TAKE ONE TABLET BY MOUTH ONCE DAILY. 30 tablet 0  . fluticasone (FLOVENT HFA) 110 MCG/ACT inhaler Inhale 2 puffs into the lungs 2 (two) times daily. 12 g 5  . furosemide (LASIX) 40 MG tablet TAKE ONE TABLET BY MOUTH DAILY. 30 tablet 0  . insulin aspart (NOVOLOG) 100 UNIT/ML injection 0-9 Units, Subcutaneous, 3 times daily with meals CBG < 70: implement hypoglycemia protocol CBG 70 - 120: 0 units CBG 121 - 150: 1 unit CBG 151 - 200: 2 units  CBG 201 - 250: 3 units CBG 251 - 300: 5 units CBG 301 - 350: 7 units CBG 351 - 400: 9 units CBG > 400 10 mL 11  . levothyroxine (SYNTHROID, LEVOTHROID) 75 MCG tablet Take 1 tablet (75 mcg total) by mouth daily. 90 tablet 1  . losartan (COZAAR) 25 MG tablet TAKE 1 TABLET BY MOUTH ONCE DAILY. 90 tablet 3  . meclizine (ANTIVERT) 25 MG tablet Take 1 tablet (25 mg total) by mouth 3 (three) times daily as needed for dizziness. 30 tablet 0  . neomycin-polymyxin-hydrocortisone (CORTISPORIN) OTIC solution Place 4 drops into the left ear 4 (four) times daily. 10 mL 1  . nitroGLYCERIN (NITROLINGUAL) 0.4 MG/SPRAY spray Place 1 spray under the tongue every  5 (five) minutes x 3 doses as needed for chest pain. 12 g 3  . ONE TOUCH ULTRA TEST test strip USE TO TEST BLOOD SUGAR ONCE DAILY. 50 each 0  . Oxycodone HCl 10 MG TABS Take 1 tablet (10 mg total) by mouth 2 (two) times daily as needed (FOR PAIN). 15 tablet 0  . pantoprazole (PROTONIX) 40 MG tablet Take 1 tablet (40 mg total) by mouth daily. 60 tablet 3  . Potassium Chloride ER 20 MEQ TBCR One po daily (Patient taking differently: Take 1 tablet by mouth daily. One po daily) 90 tablet 1  . triamcinolone cream (KENALOG) 0.1 % Apply 1 application topically 2 (two) times daily. 60 g 6  . warfarin (COUMADIN) 5 MG tablet TAKE ONE TABLET BY MOUTH ONCE DAILY. TAKE AS DIRECTED BY DOCTOR. (Patient taking differently: Take 5 mg by mouth See admin instructions. Take one tablet on Monday, Wednesday, Friday and Saturday. Take one half tablet on Sunday, Tuesday and Thursday. Take as directed by doctor) 30 tablet 5  . [DISCONTINUED] ferrous sulfate 325 (65 FE) MG tablet Take 1 tablet (325 mg total) by mouth daily with breakfast. IRON SUPPLEMENT.    . [DISCONTINUED] ipratropium (ATROVENT) 0.02 % nebulizer solution Take 500 mcg by nebulization every 4 (four) hours as needed. For shortness of breath     No current facility-administered medications on file prior to visit.      Social History   Socioeconomic History  . Marital status: Widowed    Spouse name: Not on file  . Number of children: Not on file  . Years of education: Not on file  . Highest education level: Not on file  Occupational History  . Occupation: retired    Fish farm manager: RETIRED  Social Needs  . Financial resource strain: Not on file  . Food insecurity    Worry: Not on file    Inability: Not on file  . Transportation needs    Medical: Not on file    Non-medical: Not on file  Tobacco Use  . Smoking status: Former Smoker    Packs/day: 2.00    Years: 30.00    Pack years: 60.00    Types: Cigarettes    Start date: 05/05/1987  . Smokeless tobacco: Never Used  Substance and Sexual Activity  . Alcohol use: No    Alcohol/week: 0.0 standard drinks  . Drug use: No  . Sexual activity: Never    Birth control/protection: Abstinence  Lifestyle  . Physical activity    Days per week: Not on file    Minutes per session: Not on file  . Stress: Not on file  Relationships  . Social Herbalist on phone: Not on file    Gets together: Not on file    Attends religious service: Not on file    Active member of club or organization: Not on file    Attends meetings of clubs or organizations: Not on file    Relationship status: Not on file  . Intimate partner violence    Fear of current or ex partner: Not on file    Emotionally abused: Not on file    Physically abused: Not on file    Forced sexual activity: Not on file  Other Topics Concern  . Not on file  Social History Narrative  . Not on file    Family History  Problem Relation Age of Onset  . Heart attack Mother   . Diabetes Mother   . Hyperlipidemia  Mother   . Heart attack Father   . Lung cancer Sister   . Lymphoma Brother   . Colon cancer Neg Hx     BP 116/82   Pulse 82   204 lb; 5'1", afebrile; 82 pulse    Objective:   Physical Exam Vitals signs reviewed.  Constitutional:      Appearance: She is  well-developed.  HENT:     Head: Normocephalic and atraumatic.  Eyes:     Conjunctiva/sclera: Conjunctivae normal.     Pupils: Pupils are equal, round, and reactive to light.  Neck:     Musculoskeletal: Normal range of motion and neck supple.  Cardiovascular:     Rate and Rhythm: Normal rate and regular rhythm.  Pulmonary:     Effort: Pulmonary effort is normal.  Abdominal:     Palpations: Abdomen is soft.  Musculoskeletal:     Right knee: She exhibits decreased range of motion, swelling, effusion and deformity. Tenderness found. Medial joint line tenderness noted.     Left knee: She exhibits decreased range of motion, swelling, effusion and deformity. Tenderness found. Medial joint line tenderness noted.       Legs:  Skin:    General: Skin is warm and dry.  Neurological:     Mental Status: She is alert and oriented to person, place, and time.     Cranial Nerves: No cranial nerve deficit.     Motor: No abnormal muscle tone.     Coordination: Coordination normal.     Deep Tendon Reflexes: Reflexes are normal and symmetric. Reflexes normal.  Psychiatric:        Behavior: Behavior normal.        Thought Content: Thought content normal.        Judgment: Judgment normal.    I have reviewed the [facility] from [facility].       Assessment & Plan:   Encounter Diagnosis  Name Primary?  . Chronic pain of both knees Yes   I will order x-rays of both knees, to be done at nursing home.  PROCEDURE NOTE:  The patient requests injections of the left knee , verbal consent was obtained.  The left knee was prepped appropriately after time out was performed.   Sterile technique was observed and injection of 1 cc of Depo-Medrol 40 mg with several cc's of plain xylocaine. Anesthesia was provided by ethyl chloride and a 20-gauge needle was used to inject the knee area. The injection was tolerated well.  A band aid dressing was applied.  The patient was advised to apply ice later  today and tomorrow to the injection sight as needed.  PROCEDURE NOTE:  The patient requests injections of the right knee , verbal consent was obtained.  The right knee was prepped appropriately after time out was performed.   Sterile technique was observed and injection of 1 cc of Depo-Medrol 40 mg with several cc's of plain xylocaine. Anesthesia was provided by ethyl chloride and a 20-gauge needle was used to inject the knee area. The injection was tolerated well.  A band aid dressing was applied.  The patient was advised to apply ice later today and tomorrow to the injection sight as needed.  Return in 3 weeks.  Review x-rays then.  Call if any problem.  Precautions discussed.   Electronically Signed Sanjuana Kava, MD 6/30/202011:44 AM

## 2018-12-28 ENCOUNTER — Ambulatory Visit (INDEPENDENT_AMBULATORY_CARE_PROVIDER_SITE_OTHER): Payer: Medicare HMO | Admitting: Otolaryngology

## 2018-12-28 DIAGNOSIS — I480 Paroxysmal atrial fibrillation: Secondary | ICD-10-CM | POA: Diagnosis not present

## 2018-12-28 DIAGNOSIS — J45909 Unspecified asthma, uncomplicated: Secondary | ICD-10-CM | POA: Diagnosis not present

## 2018-12-28 DIAGNOSIS — Z79899 Other long term (current) drug therapy: Secondary | ICD-10-CM | POA: Diagnosis not present

## 2018-12-28 DIAGNOSIS — Z7901 Long term (current) use of anticoagulants: Secondary | ICD-10-CM | POA: Diagnosis not present

## 2018-12-28 DIAGNOSIS — I4891 Unspecified atrial fibrillation: Secondary | ICD-10-CM | POA: Diagnosis not present

## 2018-12-31 DIAGNOSIS — E785 Hyperlipidemia, unspecified: Secondary | ICD-10-CM | POA: Diagnosis not present

## 2018-12-31 DIAGNOSIS — I119 Hypertensive heart disease without heart failure: Secondary | ICD-10-CM | POA: Diagnosis not present

## 2018-12-31 DIAGNOSIS — E039 Hypothyroidism, unspecified: Secondary | ICD-10-CM | POA: Diagnosis not present

## 2018-12-31 DIAGNOSIS — I480 Paroxysmal atrial fibrillation: Secondary | ICD-10-CM | POA: Diagnosis not present

## 2018-12-31 DIAGNOSIS — Z79899 Other long term (current) drug therapy: Secondary | ICD-10-CM | POA: Diagnosis not present

## 2018-12-31 DIAGNOSIS — Z7901 Long term (current) use of anticoagulants: Secondary | ICD-10-CM | POA: Diagnosis not present

## 2018-12-31 DIAGNOSIS — E119 Type 2 diabetes mellitus without complications: Secondary | ICD-10-CM | POA: Diagnosis not present

## 2018-12-31 DIAGNOSIS — E0822 Diabetes mellitus due to underlying condition with diabetic chronic kidney disease: Secondary | ICD-10-CM | POA: Diagnosis not present

## 2018-12-31 DIAGNOSIS — I4891 Unspecified atrial fibrillation: Secondary | ICD-10-CM | POA: Diagnosis not present

## 2018-12-31 DIAGNOSIS — N184 Chronic kidney disease, stage 4 (severe): Secondary | ICD-10-CM | POA: Diagnosis not present

## 2018-12-31 DIAGNOSIS — D649 Anemia, unspecified: Secondary | ICD-10-CM | POA: Diagnosis not present

## 2019-01-01 DIAGNOSIS — I480 Paroxysmal atrial fibrillation: Secondary | ICD-10-CM | POA: Diagnosis not present

## 2019-01-01 DIAGNOSIS — I4891 Unspecified atrial fibrillation: Secondary | ICD-10-CM | POA: Diagnosis not present

## 2019-01-04 DIAGNOSIS — Z79899 Other long term (current) drug therapy: Secondary | ICD-10-CM | POA: Diagnosis not present

## 2019-01-04 DIAGNOSIS — Z7901 Long term (current) use of anticoagulants: Secondary | ICD-10-CM | POA: Diagnosis not present

## 2019-01-04 DIAGNOSIS — I480 Paroxysmal atrial fibrillation: Secondary | ICD-10-CM | POA: Diagnosis not present

## 2019-01-07 DIAGNOSIS — D649 Anemia, unspecified: Secondary | ICD-10-CM | POA: Diagnosis not present

## 2019-01-07 DIAGNOSIS — N184 Chronic kidney disease, stage 4 (severe): Secondary | ICD-10-CM | POA: Diagnosis not present

## 2019-01-07 DIAGNOSIS — I4891 Unspecified atrial fibrillation: Secondary | ICD-10-CM | POA: Diagnosis not present

## 2019-01-07 DIAGNOSIS — I480 Paroxysmal atrial fibrillation: Secondary | ICD-10-CM | POA: Diagnosis not present

## 2019-01-11 DIAGNOSIS — R799 Abnormal finding of blood chemistry, unspecified: Secondary | ICD-10-CM | POA: Diagnosis not present

## 2019-01-11 DIAGNOSIS — I119 Hypertensive heart disease without heart failure: Secondary | ICD-10-CM | POA: Diagnosis not present

## 2019-01-11 DIAGNOSIS — I480 Paroxysmal atrial fibrillation: Secondary | ICD-10-CM | POA: Diagnosis not present

## 2019-01-11 DIAGNOSIS — I4891 Unspecified atrial fibrillation: Secondary | ICD-10-CM | POA: Diagnosis not present

## 2019-01-11 DIAGNOSIS — Z79899 Other long term (current) drug therapy: Secondary | ICD-10-CM | POA: Diagnosis not present

## 2019-01-11 DIAGNOSIS — Z7901 Long term (current) use of anticoagulants: Secondary | ICD-10-CM | POA: Diagnosis not present

## 2019-01-12 ENCOUNTER — Ambulatory Visit (INDEPENDENT_AMBULATORY_CARE_PROVIDER_SITE_OTHER): Payer: Medicare HMO | Admitting: Orthopaedic Surgery

## 2019-01-12 ENCOUNTER — Other Ambulatory Visit: Payer: Self-pay

## 2019-01-12 ENCOUNTER — Encounter: Payer: Self-pay | Admitting: Orthopaedic Surgery

## 2019-01-12 VITALS — BP 117/75 | HR 61 | Temp 98.6°F

## 2019-01-12 DIAGNOSIS — M25562 Pain in left knee: Secondary | ICD-10-CM | POA: Diagnosis not present

## 2019-01-12 DIAGNOSIS — G8929 Other chronic pain: Secondary | ICD-10-CM

## 2019-01-12 DIAGNOSIS — M25561 Pain in right knee: Secondary | ICD-10-CM

## 2019-01-12 NOTE — Progress Notes (Signed)
CC: Both of my knees are hurting. I would like an injection in both knees.  The patient has had chronic pain and tenderness of both knees for some time.  Injections help.  There is no locking or giving way of the knee.  There is no new trauma. There is no redness or signs of infections.  The knees have a mild effusion and some crepitus.  There is no redness or signs of recent trauma.  Right knee ROM is 0-95 and left knee ROM is 0-90.  Impression:  Chronic pain of the both knees  Return:  6 weeks  PROCEDURE NOTE:  The patient requests injections of both knees, verbal consent was obtained.  The left and right knee were individually prepped appropriately after time out was performed.   Sterile technique was observed and injection of 1 cc of Depo-Medrol 40 mg with several cc's of plain xylocaine. Anesthesia was provided by ethyl chloride and a 20-gauge needle was used to inject each knee area. The injections were tolerated well.  A band aid dressing was applied.  The patient was advised to apply ice later today and tomorrow to the injection sight as needed.   Electronically Signed Sanjuana Kava, MD 7/21/202010:15 AM

## 2019-01-14 DIAGNOSIS — I480 Paroxysmal atrial fibrillation: Secondary | ICD-10-CM | POA: Diagnosis not present

## 2019-01-14 DIAGNOSIS — Z7901 Long term (current) use of anticoagulants: Secondary | ICD-10-CM | POA: Diagnosis not present

## 2019-01-14 DIAGNOSIS — I4891 Unspecified atrial fibrillation: Secondary | ICD-10-CM | POA: Diagnosis not present

## 2019-01-14 DIAGNOSIS — Z79899 Other long term (current) drug therapy: Secondary | ICD-10-CM | POA: Diagnosis not present

## 2019-01-15 DIAGNOSIS — R05 Cough: Secondary | ICD-10-CM | POA: Diagnosis not present

## 2019-01-15 DIAGNOSIS — D649 Anemia, unspecified: Secondary | ICD-10-CM | POA: Diagnosis not present

## 2019-01-15 DIAGNOSIS — I517 Cardiomegaly: Secondary | ICD-10-CM | POA: Diagnosis not present

## 2019-01-15 DIAGNOSIS — R319 Hematuria, unspecified: Secondary | ICD-10-CM | POA: Diagnosis not present

## 2019-01-15 DIAGNOSIS — N184 Chronic kidney disease, stage 4 (severe): Secondary | ICD-10-CM | POA: Diagnosis not present

## 2019-01-15 DIAGNOSIS — R4182 Altered mental status, unspecified: Secondary | ICD-10-CM | POA: Diagnosis not present

## 2019-01-15 DIAGNOSIS — I480 Paroxysmal atrial fibrillation: Secondary | ICD-10-CM | POA: Diagnosis not present

## 2019-01-15 DIAGNOSIS — Z79899 Other long term (current) drug therapy: Secondary | ICD-10-CM | POA: Diagnosis not present

## 2019-01-15 DIAGNOSIS — N390 Urinary tract infection, site not specified: Secondary | ICD-10-CM | POA: Diagnosis not present

## 2019-01-16 DIAGNOSIS — I480 Paroxysmal atrial fibrillation: Secondary | ICD-10-CM | POA: Diagnosis not present

## 2019-01-16 DIAGNOSIS — I4891 Unspecified atrial fibrillation: Secondary | ICD-10-CM | POA: Diagnosis not present

## 2019-01-18 DIAGNOSIS — Z7901 Long term (current) use of anticoagulants: Secondary | ICD-10-CM | POA: Diagnosis not present

## 2019-01-18 DIAGNOSIS — N390 Urinary tract infection, site not specified: Secondary | ICD-10-CM | POA: Diagnosis not present

## 2019-01-18 DIAGNOSIS — Z79899 Other long term (current) drug therapy: Secondary | ICD-10-CM | POA: Diagnosis not present

## 2019-01-19 DIAGNOSIS — I10 Essential (primary) hypertension: Secondary | ICD-10-CM | POA: Diagnosis not present

## 2019-01-19 DIAGNOSIS — I4891 Unspecified atrial fibrillation: Secondary | ICD-10-CM | POA: Diagnosis not present

## 2019-01-20 DIAGNOSIS — I480 Paroxysmal atrial fibrillation: Secondary | ICD-10-CM | POA: Diagnosis not present

## 2019-01-20 DIAGNOSIS — I4891 Unspecified atrial fibrillation: Secondary | ICD-10-CM | POA: Diagnosis not present

## 2019-01-21 DIAGNOSIS — I4891 Unspecified atrial fibrillation: Secondary | ICD-10-CM | POA: Diagnosis not present

## 2019-01-21 DIAGNOSIS — I480 Paroxysmal atrial fibrillation: Secondary | ICD-10-CM | POA: Diagnosis not present

## 2019-01-21 DIAGNOSIS — Z7901 Long term (current) use of anticoagulants: Secondary | ICD-10-CM | POA: Diagnosis not present

## 2019-01-21 DIAGNOSIS — Z79899 Other long term (current) drug therapy: Secondary | ICD-10-CM | POA: Diagnosis not present

## 2019-01-25 DIAGNOSIS — N390 Urinary tract infection, site not specified: Secondary | ICD-10-CM | POA: Diagnosis not present

## 2019-01-25 DIAGNOSIS — I480 Paroxysmal atrial fibrillation: Secondary | ICD-10-CM | POA: Diagnosis not present

## 2019-01-25 DIAGNOSIS — B373 Candidiasis of vulva and vagina: Secondary | ICD-10-CM | POA: Diagnosis not present

## 2019-01-25 DIAGNOSIS — Z79899 Other long term (current) drug therapy: Secondary | ICD-10-CM | POA: Diagnosis not present

## 2019-01-25 DIAGNOSIS — I4891 Unspecified atrial fibrillation: Secondary | ICD-10-CM | POA: Diagnosis not present

## 2019-01-25 DIAGNOSIS — Z7901 Long term (current) use of anticoagulants: Secondary | ICD-10-CM | POA: Diagnosis not present

## 2019-01-26 DIAGNOSIS — I4891 Unspecified atrial fibrillation: Secondary | ICD-10-CM | POA: Diagnosis not present

## 2019-01-26 DIAGNOSIS — N184 Chronic kidney disease, stage 4 (severe): Secondary | ICD-10-CM | POA: Diagnosis not present

## 2019-01-26 DIAGNOSIS — I480 Paroxysmal atrial fibrillation: Secondary | ICD-10-CM | POA: Diagnosis not present

## 2019-01-26 DIAGNOSIS — D649 Anemia, unspecified: Secondary | ICD-10-CM | POA: Diagnosis not present

## 2019-01-27 DIAGNOSIS — I4891 Unspecified atrial fibrillation: Secondary | ICD-10-CM | POA: Diagnosis not present

## 2019-01-27 DIAGNOSIS — I480 Paroxysmal atrial fibrillation: Secondary | ICD-10-CM | POA: Diagnosis not present

## 2019-01-28 DIAGNOSIS — Z79899 Other long term (current) drug therapy: Secondary | ICD-10-CM | POA: Diagnosis not present

## 2019-01-28 DIAGNOSIS — Z7901 Long term (current) use of anticoagulants: Secondary | ICD-10-CM | POA: Diagnosis not present

## 2019-01-28 DIAGNOSIS — I480 Paroxysmal atrial fibrillation: Secondary | ICD-10-CM | POA: Diagnosis not present

## 2019-01-28 DIAGNOSIS — B373 Candidiasis of vulva and vagina: Secondary | ICD-10-CM | POA: Diagnosis not present

## 2019-01-29 DIAGNOSIS — I4891 Unspecified atrial fibrillation: Secondary | ICD-10-CM | POA: Diagnosis not present

## 2019-01-29 DIAGNOSIS — I480 Paroxysmal atrial fibrillation: Secondary | ICD-10-CM | POA: Diagnosis not present

## 2019-01-30 DIAGNOSIS — I480 Paroxysmal atrial fibrillation: Secondary | ICD-10-CM | POA: Diagnosis not present

## 2019-01-30 DIAGNOSIS — I4891 Unspecified atrial fibrillation: Secondary | ICD-10-CM | POA: Diagnosis not present

## 2019-02-01 DIAGNOSIS — I480 Paroxysmal atrial fibrillation: Secondary | ICD-10-CM | POA: Diagnosis not present

## 2019-02-01 DIAGNOSIS — E039 Hypothyroidism, unspecified: Secondary | ICD-10-CM | POA: Diagnosis not present

## 2019-02-01 DIAGNOSIS — Z7901 Long term (current) use of anticoagulants: Secondary | ICD-10-CM | POA: Diagnosis not present

## 2019-02-01 DIAGNOSIS — I4891 Unspecified atrial fibrillation: Secondary | ICD-10-CM | POA: Diagnosis not present

## 2019-02-01 DIAGNOSIS — Z79899 Other long term (current) drug therapy: Secondary | ICD-10-CM | POA: Diagnosis not present

## 2019-02-01 DIAGNOSIS — N390 Urinary tract infection, site not specified: Secondary | ICD-10-CM | POA: Diagnosis not present

## 2019-02-01 DIAGNOSIS — B373 Candidiasis of vulva and vagina: Secondary | ICD-10-CM | POA: Diagnosis not present

## 2019-02-02 DIAGNOSIS — I480 Paroxysmal atrial fibrillation: Secondary | ICD-10-CM | POA: Diagnosis not present

## 2019-02-02 DIAGNOSIS — I4891 Unspecified atrial fibrillation: Secondary | ICD-10-CM | POA: Diagnosis not present

## 2019-02-03 DIAGNOSIS — I480 Paroxysmal atrial fibrillation: Secondary | ICD-10-CM | POA: Diagnosis not present

## 2019-02-03 DIAGNOSIS — I4891 Unspecified atrial fibrillation: Secondary | ICD-10-CM | POA: Diagnosis not present

## 2019-02-04 DIAGNOSIS — I480 Paroxysmal atrial fibrillation: Secondary | ICD-10-CM | POA: Diagnosis not present

## 2019-02-04 DIAGNOSIS — I4891 Unspecified atrial fibrillation: Secondary | ICD-10-CM | POA: Diagnosis not present

## 2019-02-05 DIAGNOSIS — I480 Paroxysmal atrial fibrillation: Secondary | ICD-10-CM | POA: Diagnosis not present

## 2019-02-05 DIAGNOSIS — I4891 Unspecified atrial fibrillation: Secondary | ICD-10-CM | POA: Diagnosis not present

## 2019-02-05 DIAGNOSIS — Z20828 Contact with and (suspected) exposure to other viral communicable diseases: Secondary | ICD-10-CM | POA: Diagnosis not present

## 2019-02-06 DIAGNOSIS — I4891 Unspecified atrial fibrillation: Secondary | ICD-10-CM | POA: Diagnosis not present

## 2019-02-06 DIAGNOSIS — I480 Paroxysmal atrial fibrillation: Secondary | ICD-10-CM | POA: Diagnosis not present

## 2019-02-07 DIAGNOSIS — I4891 Unspecified atrial fibrillation: Secondary | ICD-10-CM | POA: Diagnosis not present

## 2019-02-07 DIAGNOSIS — I480 Paroxysmal atrial fibrillation: Secondary | ICD-10-CM | POA: Diagnosis not present

## 2019-02-08 DIAGNOSIS — Z7901 Long term (current) use of anticoagulants: Secondary | ICD-10-CM | POA: Diagnosis not present

## 2019-02-08 DIAGNOSIS — Z79899 Other long term (current) drug therapy: Secondary | ICD-10-CM | POA: Diagnosis not present

## 2019-02-08 DIAGNOSIS — B373 Candidiasis of vulva and vagina: Secondary | ICD-10-CM | POA: Diagnosis not present

## 2019-02-08 DIAGNOSIS — I4891 Unspecified atrial fibrillation: Secondary | ICD-10-CM | POA: Diagnosis not present

## 2019-02-08 DIAGNOSIS — I480 Paroxysmal atrial fibrillation: Secondary | ICD-10-CM | POA: Diagnosis not present

## 2019-02-09 DIAGNOSIS — I4891 Unspecified atrial fibrillation: Secondary | ICD-10-CM | POA: Diagnosis not present

## 2019-02-09 DIAGNOSIS — I480 Paroxysmal atrial fibrillation: Secondary | ICD-10-CM | POA: Diagnosis not present

## 2019-02-11 DIAGNOSIS — Z7901 Long term (current) use of anticoagulants: Secondary | ICD-10-CM | POA: Diagnosis not present

## 2019-02-11 DIAGNOSIS — B373 Candidiasis of vulva and vagina: Secondary | ICD-10-CM | POA: Diagnosis not present

## 2019-02-11 DIAGNOSIS — N952 Postmenopausal atrophic vaginitis: Secondary | ICD-10-CM | POA: Diagnosis not present

## 2019-02-11 DIAGNOSIS — Z79899 Other long term (current) drug therapy: Secondary | ICD-10-CM | POA: Diagnosis not present

## 2019-02-11 DIAGNOSIS — I4891 Unspecified atrial fibrillation: Secondary | ICD-10-CM | POA: Diagnosis not present

## 2019-02-11 DIAGNOSIS — I480 Paroxysmal atrial fibrillation: Secondary | ICD-10-CM | POA: Diagnosis not present

## 2019-02-12 DIAGNOSIS — I480 Paroxysmal atrial fibrillation: Secondary | ICD-10-CM | POA: Diagnosis not present

## 2019-02-12 DIAGNOSIS — I4891 Unspecified atrial fibrillation: Secondary | ICD-10-CM | POA: Diagnosis not present

## 2019-02-13 DIAGNOSIS — I4891 Unspecified atrial fibrillation: Secondary | ICD-10-CM | POA: Diagnosis not present

## 2019-02-13 DIAGNOSIS — I480 Paroxysmal atrial fibrillation: Secondary | ICD-10-CM | POA: Diagnosis not present

## 2019-02-15 DIAGNOSIS — I4891 Unspecified atrial fibrillation: Secondary | ICD-10-CM | POA: Diagnosis not present

## 2019-02-15 DIAGNOSIS — I480 Paroxysmal atrial fibrillation: Secondary | ICD-10-CM | POA: Diagnosis not present

## 2019-02-18 DIAGNOSIS — I480 Paroxysmal atrial fibrillation: Secondary | ICD-10-CM | POA: Diagnosis not present

## 2019-02-18 DIAGNOSIS — Z7901 Long term (current) use of anticoagulants: Secondary | ICD-10-CM | POA: Diagnosis not present

## 2019-02-18 DIAGNOSIS — N952 Postmenopausal atrophic vaginitis: Secondary | ICD-10-CM | POA: Diagnosis not present

## 2019-02-18 DIAGNOSIS — L209 Atopic dermatitis, unspecified: Secondary | ICD-10-CM | POA: Diagnosis not present

## 2019-02-18 DIAGNOSIS — I4891 Unspecified atrial fibrillation: Secondary | ICD-10-CM | POA: Diagnosis not present

## 2019-02-18 DIAGNOSIS — Z79899 Other long term (current) drug therapy: Secondary | ICD-10-CM | POA: Diagnosis not present

## 2019-02-19 DIAGNOSIS — I480 Paroxysmal atrial fibrillation: Secondary | ICD-10-CM | POA: Diagnosis not present

## 2019-02-19 DIAGNOSIS — I4891 Unspecified atrial fibrillation: Secondary | ICD-10-CM | POA: Diagnosis not present

## 2019-02-22 DIAGNOSIS — Z7901 Long term (current) use of anticoagulants: Secondary | ICD-10-CM | POA: Diagnosis not present

## 2019-02-22 DIAGNOSIS — I4891 Unspecified atrial fibrillation: Secondary | ICD-10-CM | POA: Diagnosis not present

## 2019-02-22 DIAGNOSIS — Z79899 Other long term (current) drug therapy: Secondary | ICD-10-CM | POA: Diagnosis not present

## 2019-02-22 DIAGNOSIS — I480 Paroxysmal atrial fibrillation: Secondary | ICD-10-CM | POA: Diagnosis not present

## 2019-02-23 ENCOUNTER — Ambulatory Visit: Payer: Medicare HMO | Admitting: Orthopaedic Surgery

## 2019-02-24 DIAGNOSIS — R69 Illness, unspecified: Secondary | ICD-10-CM | POA: Diagnosis not present

## 2019-02-24 DIAGNOSIS — I10 Essential (primary) hypertension: Secondary | ICD-10-CM | POA: Diagnosis not present

## 2019-02-24 DIAGNOSIS — I2510 Atherosclerotic heart disease of native coronary artery without angina pectoris: Secondary | ICD-10-CM | POA: Diagnosis not present

## 2019-02-25 DIAGNOSIS — Z7901 Long term (current) use of anticoagulants: Secondary | ICD-10-CM | POA: Diagnosis not present

## 2019-02-25 DIAGNOSIS — Z79899 Other long term (current) drug therapy: Secondary | ICD-10-CM | POA: Diagnosis not present

## 2019-02-25 DIAGNOSIS — I480 Paroxysmal atrial fibrillation: Secondary | ICD-10-CM | POA: Diagnosis not present

## 2019-03-04 ENCOUNTER — Ambulatory Visit (INDEPENDENT_AMBULATORY_CARE_PROVIDER_SITE_OTHER): Payer: Medicare HMO | Admitting: Orthopaedic Surgery

## 2019-03-04 ENCOUNTER — Encounter: Payer: Self-pay | Admitting: Orthopaedic Surgery

## 2019-03-04 ENCOUNTER — Other Ambulatory Visit: Payer: Self-pay

## 2019-03-04 VITALS — BP 124/63 | HR 64 | Temp 97.2°F

## 2019-03-04 DIAGNOSIS — I480 Paroxysmal atrial fibrillation: Secondary | ICD-10-CM | POA: Diagnosis not present

## 2019-03-04 DIAGNOSIS — M25561 Pain in right knee: Secondary | ICD-10-CM | POA: Diagnosis not present

## 2019-03-04 DIAGNOSIS — M25562 Pain in left knee: Secondary | ICD-10-CM

## 2019-03-04 DIAGNOSIS — Z794 Long term (current) use of insulin: Secondary | ICD-10-CM | POA: Diagnosis not present

## 2019-03-04 DIAGNOSIS — I4891 Unspecified atrial fibrillation: Secondary | ICD-10-CM | POA: Diagnosis not present

## 2019-03-04 DIAGNOSIS — G8929 Other chronic pain: Secondary | ICD-10-CM

## 2019-03-04 DIAGNOSIS — J029 Acute pharyngitis, unspecified: Secondary | ICD-10-CM | POA: Diagnosis not present

## 2019-03-04 DIAGNOSIS — Z79899 Other long term (current) drug therapy: Secondary | ICD-10-CM | POA: Diagnosis not present

## 2019-03-04 NOTE — Progress Notes (Signed)
CC: Both of my knees are hurting. I would like an injection in both knees.  The patient has had chronic pain and tenderness of both knees for some time.  Injections help.  There is no locking or giving way of the knee.  There is no new trauma. There is no redness or signs of infections.  The knees have a mild effusion and some crepitus.  There is no redness or signs of recent trauma.  Right knee ROM is 0-100 and left knee ROM is 0-105.  Impression:  Chronic pain of the both knees  Return:  3 months  PROCEDURE NOTE:  The patient requests injections of both knees, verbal consent was obtained.  The left and right knee were individually prepped appropriately after time out was performed.   Sterile technique was observed and injection of 1 cc of Depo-Medrol 40 mg with several cc's of plain xylocaine. Anesthesia was provided by ethyl chloride and a 20-gauge needle was used to inject each knee area. The injections were tolerated well.  A band aid dressing was applied.  The patient was advised to apply ice later today and tomorrow to the injection sight as needed.  Forms for nursing home completed.  Electronically Signed Sanjuana Kava, MD 9/10/20209:07 AM

## 2019-03-05 DIAGNOSIS — I480 Paroxysmal atrial fibrillation: Secondary | ICD-10-CM | POA: Diagnosis not present

## 2019-03-05 DIAGNOSIS — I4891 Unspecified atrial fibrillation: Secondary | ICD-10-CM | POA: Diagnosis not present

## 2019-03-05 DIAGNOSIS — R070 Pain in throat: Secondary | ICD-10-CM | POA: Diagnosis not present

## 2019-03-08 DIAGNOSIS — J029 Acute pharyngitis, unspecified: Secondary | ICD-10-CM | POA: Diagnosis not present

## 2019-03-08 DIAGNOSIS — I4891 Unspecified atrial fibrillation: Secondary | ICD-10-CM | POA: Diagnosis not present

## 2019-03-08 DIAGNOSIS — Z7901 Long term (current) use of anticoagulants: Secondary | ICD-10-CM | POA: Diagnosis not present

## 2019-03-08 DIAGNOSIS — Z20828 Contact with and (suspected) exposure to other viral communicable diseases: Secondary | ICD-10-CM | POA: Diagnosis not present

## 2019-03-08 DIAGNOSIS — Z79899 Other long term (current) drug therapy: Secondary | ICD-10-CM | POA: Diagnosis not present

## 2019-03-08 DIAGNOSIS — I480 Paroxysmal atrial fibrillation: Secondary | ICD-10-CM | POA: Diagnosis not present

## 2019-03-09 DIAGNOSIS — I480 Paroxysmal atrial fibrillation: Secondary | ICD-10-CM | POA: Diagnosis not present

## 2019-03-09 DIAGNOSIS — I4891 Unspecified atrial fibrillation: Secondary | ICD-10-CM | POA: Diagnosis not present

## 2019-03-10 DIAGNOSIS — I4891 Unspecified atrial fibrillation: Secondary | ICD-10-CM | POA: Diagnosis not present

## 2019-03-10 DIAGNOSIS — I480 Paroxysmal atrial fibrillation: Secondary | ICD-10-CM | POA: Diagnosis not present

## 2019-03-11 DIAGNOSIS — I4891 Unspecified atrial fibrillation: Secondary | ICD-10-CM | POA: Diagnosis not present

## 2019-03-11 DIAGNOSIS — J029 Acute pharyngitis, unspecified: Secondary | ICD-10-CM | POA: Diagnosis not present

## 2019-03-11 DIAGNOSIS — I480 Paroxysmal atrial fibrillation: Secondary | ICD-10-CM | POA: Diagnosis not present

## 2019-03-11 DIAGNOSIS — Z79899 Other long term (current) drug therapy: Secondary | ICD-10-CM | POA: Diagnosis not present

## 2019-03-15 DIAGNOSIS — Z7901 Long term (current) use of anticoagulants: Secondary | ICD-10-CM | POA: Diagnosis not present

## 2019-03-15 DIAGNOSIS — Z20828 Contact with and (suspected) exposure to other viral communicable diseases: Secondary | ICD-10-CM | POA: Diagnosis not present

## 2019-03-15 DIAGNOSIS — Z79899 Other long term (current) drug therapy: Secondary | ICD-10-CM | POA: Diagnosis not present

## 2019-03-15 DIAGNOSIS — I480 Paroxysmal atrial fibrillation: Secondary | ICD-10-CM | POA: Diagnosis not present

## 2019-03-15 DIAGNOSIS — I4891 Unspecified atrial fibrillation: Secondary | ICD-10-CM | POA: Diagnosis not present

## 2019-03-18 DIAGNOSIS — Z79899 Other long term (current) drug therapy: Secondary | ICD-10-CM | POA: Diagnosis not present

## 2019-03-18 DIAGNOSIS — I10 Essential (primary) hypertension: Secondary | ICD-10-CM | POA: Diagnosis not present

## 2019-03-18 DIAGNOSIS — E039 Hypothyroidism, unspecified: Secondary | ICD-10-CM | POA: Diagnosis not present

## 2019-03-18 DIAGNOSIS — Z7901 Long term (current) use of anticoagulants: Secondary | ICD-10-CM | POA: Diagnosis not present

## 2019-03-18 DIAGNOSIS — I480 Paroxysmal atrial fibrillation: Secondary | ICD-10-CM | POA: Diagnosis not present

## 2019-03-18 DIAGNOSIS — E0822 Diabetes mellitus due to underlying condition with diabetic chronic kidney disease: Secondary | ICD-10-CM | POA: Diagnosis not present

## 2019-03-22 DIAGNOSIS — Z20828 Contact with and (suspected) exposure to other viral communicable diseases: Secondary | ICD-10-CM | POA: Diagnosis not present

## 2019-03-22 DIAGNOSIS — K589 Irritable bowel syndrome without diarrhea: Secondary | ICD-10-CM | POA: Diagnosis not present

## 2019-03-22 DIAGNOSIS — Z7901 Long term (current) use of anticoagulants: Secondary | ICD-10-CM | POA: Diagnosis not present

## 2019-03-22 DIAGNOSIS — I480 Paroxysmal atrial fibrillation: Secondary | ICD-10-CM | POA: Diagnosis not present

## 2019-03-22 DIAGNOSIS — I4891 Unspecified atrial fibrillation: Secondary | ICD-10-CM | POA: Diagnosis not present

## 2019-03-22 DIAGNOSIS — Z79899 Other long term (current) drug therapy: Secondary | ICD-10-CM | POA: Diagnosis not present

## 2019-03-25 DIAGNOSIS — K589 Irritable bowel syndrome without diarrhea: Secondary | ICD-10-CM | POA: Diagnosis not present

## 2019-03-25 DIAGNOSIS — R262 Difficulty in walking, not elsewhere classified: Secondary | ICD-10-CM | POA: Diagnosis not present

## 2019-03-25 DIAGNOSIS — Z7901 Long term (current) use of anticoagulants: Secondary | ICD-10-CM | POA: Diagnosis not present

## 2019-03-25 DIAGNOSIS — I4891 Unspecified atrial fibrillation: Secondary | ICD-10-CM | POA: Diagnosis not present

## 2019-03-25 DIAGNOSIS — R2681 Unsteadiness on feet: Secondary | ICD-10-CM | POA: Diagnosis not present

## 2019-03-25 DIAGNOSIS — I10 Essential (primary) hypertension: Secondary | ICD-10-CM | POA: Diagnosis not present

## 2019-03-25 DIAGNOSIS — Z79899 Other long term (current) drug therapy: Secondary | ICD-10-CM | POA: Diagnosis not present

## 2019-03-25 DIAGNOSIS — I480 Paroxysmal atrial fibrillation: Secondary | ICD-10-CM | POA: Diagnosis not present

## 2019-03-26 DIAGNOSIS — I10 Essential (primary) hypertension: Secondary | ICD-10-CM | POA: Diagnosis not present

## 2019-03-26 DIAGNOSIS — R262 Difficulty in walking, not elsewhere classified: Secondary | ICD-10-CM | POA: Diagnosis not present

## 2019-03-26 DIAGNOSIS — R2681 Unsteadiness on feet: Secondary | ICD-10-CM | POA: Diagnosis not present

## 2019-03-29 DIAGNOSIS — I4891 Unspecified atrial fibrillation: Secondary | ICD-10-CM | POA: Diagnosis not present

## 2019-03-29 DIAGNOSIS — I10 Essential (primary) hypertension: Secondary | ICD-10-CM | POA: Diagnosis not present

## 2019-03-29 DIAGNOSIS — R262 Difficulty in walking, not elsewhere classified: Secondary | ICD-10-CM | POA: Diagnosis not present

## 2019-03-29 DIAGNOSIS — Z79899 Other long term (current) drug therapy: Secondary | ICD-10-CM | POA: Diagnosis not present

## 2019-03-29 DIAGNOSIS — R21 Rash and other nonspecific skin eruption: Secondary | ICD-10-CM | POA: Diagnosis not present

## 2019-03-29 DIAGNOSIS — K589 Irritable bowel syndrome without diarrhea: Secondary | ICD-10-CM | POA: Diagnosis not present

## 2019-03-29 DIAGNOSIS — Z7901 Long term (current) use of anticoagulants: Secondary | ICD-10-CM | POA: Diagnosis not present

## 2019-03-29 DIAGNOSIS — I480 Paroxysmal atrial fibrillation: Secondary | ICD-10-CM | POA: Diagnosis not present

## 2019-03-29 DIAGNOSIS — R2681 Unsteadiness on feet: Secondary | ICD-10-CM | POA: Diagnosis not present

## 2019-03-30 DIAGNOSIS — Z20828 Contact with and (suspected) exposure to other viral communicable diseases: Secondary | ICD-10-CM | POA: Diagnosis not present

## 2019-03-30 DIAGNOSIS — I10 Essential (primary) hypertension: Secondary | ICD-10-CM | POA: Diagnosis not present

## 2019-03-30 DIAGNOSIS — R262 Difficulty in walking, not elsewhere classified: Secondary | ICD-10-CM | POA: Diagnosis not present

## 2019-03-30 DIAGNOSIS — R2681 Unsteadiness on feet: Secondary | ICD-10-CM | POA: Diagnosis not present

## 2019-03-31 DIAGNOSIS — I10 Essential (primary) hypertension: Secondary | ICD-10-CM | POA: Diagnosis not present

## 2019-03-31 DIAGNOSIS — R262 Difficulty in walking, not elsewhere classified: Secondary | ICD-10-CM | POA: Diagnosis not present

## 2019-03-31 DIAGNOSIS — R2681 Unsteadiness on feet: Secondary | ICD-10-CM | POA: Diagnosis not present

## 2019-04-02 DIAGNOSIS — Z20828 Contact with and (suspected) exposure to other viral communicable diseases: Secondary | ICD-10-CM | POA: Diagnosis not present

## 2019-04-05 DIAGNOSIS — I4891 Unspecified atrial fibrillation: Secondary | ICD-10-CM | POA: Diagnosis not present

## 2019-04-05 DIAGNOSIS — R262 Difficulty in walking, not elsewhere classified: Secondary | ICD-10-CM | POA: Diagnosis not present

## 2019-04-05 DIAGNOSIS — I10 Essential (primary) hypertension: Secondary | ICD-10-CM | POA: Diagnosis not present

## 2019-04-05 DIAGNOSIS — R2681 Unsteadiness on feet: Secondary | ICD-10-CM | POA: Diagnosis not present

## 2019-04-06 DIAGNOSIS — I4891 Unspecified atrial fibrillation: Secondary | ICD-10-CM | POA: Diagnosis not present

## 2019-04-06 DIAGNOSIS — Z7901 Long term (current) use of anticoagulants: Secondary | ICD-10-CM | POA: Diagnosis not present

## 2019-04-06 DIAGNOSIS — Z79899 Other long term (current) drug therapy: Secondary | ICD-10-CM | POA: Diagnosis not present

## 2019-04-06 DIAGNOSIS — K589 Irritable bowel syndrome without diarrhea: Secondary | ICD-10-CM | POA: Diagnosis not present

## 2019-04-07 DIAGNOSIS — R2681 Unsteadiness on feet: Secondary | ICD-10-CM | POA: Diagnosis not present

## 2019-04-07 DIAGNOSIS — R262 Difficulty in walking, not elsewhere classified: Secondary | ICD-10-CM | POA: Diagnosis not present

## 2019-04-07 DIAGNOSIS — Z20828 Contact with and (suspected) exposure to other viral communicable diseases: Secondary | ICD-10-CM | POA: Diagnosis not present

## 2019-04-07 DIAGNOSIS — I10 Essential (primary) hypertension: Secondary | ICD-10-CM | POA: Diagnosis not present

## 2019-04-08 DIAGNOSIS — R262 Difficulty in walking, not elsewhere classified: Secondary | ICD-10-CM | POA: Diagnosis not present

## 2019-04-08 DIAGNOSIS — I10 Essential (primary) hypertension: Secondary | ICD-10-CM | POA: Diagnosis not present

## 2019-04-08 DIAGNOSIS — R2681 Unsteadiness on feet: Secondary | ICD-10-CM | POA: Diagnosis not present

## 2019-04-09 DIAGNOSIS — R2681 Unsteadiness on feet: Secondary | ICD-10-CM | POA: Diagnosis not present

## 2019-04-09 DIAGNOSIS — R262 Difficulty in walking, not elsewhere classified: Secondary | ICD-10-CM | POA: Diagnosis not present

## 2019-04-09 DIAGNOSIS — I10 Essential (primary) hypertension: Secondary | ICD-10-CM | POA: Diagnosis not present

## 2019-04-11 DIAGNOSIS — Z20828 Contact with and (suspected) exposure to other viral communicable diseases: Secondary | ICD-10-CM | POA: Diagnosis not present

## 2019-04-12 DIAGNOSIS — Z7901 Long term (current) use of anticoagulants: Secondary | ICD-10-CM | POA: Diagnosis not present

## 2019-04-12 DIAGNOSIS — R262 Difficulty in walking, not elsewhere classified: Secondary | ICD-10-CM | POA: Diagnosis not present

## 2019-04-12 DIAGNOSIS — Z79899 Other long term (current) drug therapy: Secondary | ICD-10-CM | POA: Diagnosis not present

## 2019-04-12 DIAGNOSIS — I4891 Unspecified atrial fibrillation: Secondary | ICD-10-CM | POA: Diagnosis not present

## 2019-04-12 DIAGNOSIS — R2681 Unsteadiness on feet: Secondary | ICD-10-CM | POA: Diagnosis not present

## 2019-04-12 DIAGNOSIS — I10 Essential (primary) hypertension: Secondary | ICD-10-CM | POA: Diagnosis not present

## 2019-04-13 DIAGNOSIS — R262 Difficulty in walking, not elsewhere classified: Secondary | ICD-10-CM | POA: Diagnosis not present

## 2019-04-13 DIAGNOSIS — R2681 Unsteadiness on feet: Secondary | ICD-10-CM | POA: Diagnosis not present

## 2019-04-13 DIAGNOSIS — I10 Essential (primary) hypertension: Secondary | ICD-10-CM | POA: Diagnosis not present

## 2019-04-14 DIAGNOSIS — R262 Difficulty in walking, not elsewhere classified: Secondary | ICD-10-CM | POA: Diagnosis not present

## 2019-04-14 DIAGNOSIS — R2681 Unsteadiness on feet: Secondary | ICD-10-CM | POA: Diagnosis not present

## 2019-04-14 DIAGNOSIS — I10 Essential (primary) hypertension: Secondary | ICD-10-CM | POA: Diagnosis not present

## 2019-04-16 DIAGNOSIS — R2681 Unsteadiness on feet: Secondary | ICD-10-CM | POA: Diagnosis not present

## 2019-04-16 DIAGNOSIS — R262 Difficulty in walking, not elsewhere classified: Secondary | ICD-10-CM | POA: Diagnosis not present

## 2019-04-16 DIAGNOSIS — I10 Essential (primary) hypertension: Secondary | ICD-10-CM | POA: Diagnosis not present

## 2019-04-17 DIAGNOSIS — Z20828 Contact with and (suspected) exposure to other viral communicable diseases: Secondary | ICD-10-CM | POA: Diagnosis not present

## 2019-04-19 DIAGNOSIS — I119 Hypertensive heart disease without heart failure: Secondary | ICD-10-CM | POA: Diagnosis not present

## 2019-04-19 DIAGNOSIS — Z7901 Long term (current) use of anticoagulants: Secondary | ICD-10-CM | POA: Diagnosis not present

## 2019-04-19 DIAGNOSIS — Z79899 Other long term (current) drug therapy: Secondary | ICD-10-CM | POA: Diagnosis not present

## 2019-04-19 DIAGNOSIS — I10 Essential (primary) hypertension: Secondary | ICD-10-CM | POA: Diagnosis not present

## 2019-04-19 DIAGNOSIS — I480 Paroxysmal atrial fibrillation: Secondary | ICD-10-CM | POA: Diagnosis not present

## 2019-04-19 DIAGNOSIS — R262 Difficulty in walking, not elsewhere classified: Secondary | ICD-10-CM | POA: Diagnosis not present

## 2019-04-19 DIAGNOSIS — I4891 Unspecified atrial fibrillation: Secondary | ICD-10-CM | POA: Diagnosis not present

## 2019-04-19 DIAGNOSIS — R2681 Unsteadiness on feet: Secondary | ICD-10-CM | POA: Diagnosis not present

## 2019-04-19 DIAGNOSIS — E119 Type 2 diabetes mellitus without complications: Secondary | ICD-10-CM | POA: Diagnosis not present

## 2019-04-20 DIAGNOSIS — R2681 Unsteadiness on feet: Secondary | ICD-10-CM | POA: Diagnosis not present

## 2019-04-20 DIAGNOSIS — I10 Essential (primary) hypertension: Secondary | ICD-10-CM | POA: Diagnosis not present

## 2019-04-20 DIAGNOSIS — R262 Difficulty in walking, not elsewhere classified: Secondary | ICD-10-CM | POA: Diagnosis not present

## 2019-04-22 DIAGNOSIS — R262 Difficulty in walking, not elsewhere classified: Secondary | ICD-10-CM | POA: Diagnosis not present

## 2019-04-22 DIAGNOSIS — I10 Essential (primary) hypertension: Secondary | ICD-10-CM | POA: Diagnosis not present

## 2019-04-22 DIAGNOSIS — R2681 Unsteadiness on feet: Secondary | ICD-10-CM | POA: Diagnosis not present

## 2019-04-23 DIAGNOSIS — R2681 Unsteadiness on feet: Secondary | ICD-10-CM | POA: Diagnosis not present

## 2019-04-23 DIAGNOSIS — R262 Difficulty in walking, not elsewhere classified: Secondary | ICD-10-CM | POA: Diagnosis not present

## 2019-04-23 DIAGNOSIS — I10 Essential (primary) hypertension: Secondary | ICD-10-CM | POA: Diagnosis not present

## 2019-04-24 DIAGNOSIS — Z20828 Contact with and (suspected) exposure to other viral communicable diseases: Secondary | ICD-10-CM | POA: Diagnosis not present

## 2019-04-26 DIAGNOSIS — K589 Irritable bowel syndrome without diarrhea: Secondary | ICD-10-CM | POA: Diagnosis not present

## 2019-04-26 DIAGNOSIS — Z7901 Long term (current) use of anticoagulants: Secondary | ICD-10-CM | POA: Diagnosis not present

## 2019-04-26 DIAGNOSIS — R2681 Unsteadiness on feet: Secondary | ICD-10-CM | POA: Diagnosis not present

## 2019-04-26 DIAGNOSIS — Z79899 Other long term (current) drug therapy: Secondary | ICD-10-CM | POA: Diagnosis not present

## 2019-04-26 DIAGNOSIS — I10 Essential (primary) hypertension: Secondary | ICD-10-CM | POA: Diagnosis not present

## 2019-04-26 DIAGNOSIS — R262 Difficulty in walking, not elsewhere classified: Secondary | ICD-10-CM | POA: Diagnosis not present

## 2019-04-26 DIAGNOSIS — I4891 Unspecified atrial fibrillation: Secondary | ICD-10-CM | POA: Diagnosis not present

## 2019-04-27 DIAGNOSIS — I10 Essential (primary) hypertension: Secondary | ICD-10-CM | POA: Diagnosis not present

## 2019-04-27 DIAGNOSIS — R2681 Unsteadiness on feet: Secondary | ICD-10-CM | POA: Diagnosis not present

## 2019-04-27 DIAGNOSIS — R262 Difficulty in walking, not elsewhere classified: Secondary | ICD-10-CM | POA: Diagnosis not present

## 2019-04-28 DIAGNOSIS — R2681 Unsteadiness on feet: Secondary | ICD-10-CM | POA: Diagnosis not present

## 2019-04-28 DIAGNOSIS — R262 Difficulty in walking, not elsewhere classified: Secondary | ICD-10-CM | POA: Diagnosis not present

## 2019-04-28 DIAGNOSIS — I10 Essential (primary) hypertension: Secondary | ICD-10-CM | POA: Diagnosis not present

## 2019-04-30 DIAGNOSIS — R2681 Unsteadiness on feet: Secondary | ICD-10-CM | POA: Diagnosis not present

## 2019-04-30 DIAGNOSIS — I10 Essential (primary) hypertension: Secondary | ICD-10-CM | POA: Diagnosis not present

## 2019-04-30 DIAGNOSIS — R262 Difficulty in walking, not elsewhere classified: Secondary | ICD-10-CM | POA: Diagnosis not present

## 2019-05-03 DIAGNOSIS — I10 Essential (primary) hypertension: Secondary | ICD-10-CM | POA: Diagnosis not present

## 2019-05-03 DIAGNOSIS — Z7901 Long term (current) use of anticoagulants: Secondary | ICD-10-CM | POA: Diagnosis not present

## 2019-05-03 DIAGNOSIS — R262 Difficulty in walking, not elsewhere classified: Secondary | ICD-10-CM | POA: Diagnosis not present

## 2019-05-03 DIAGNOSIS — R2681 Unsteadiness on feet: Secondary | ICD-10-CM | POA: Diagnosis not present

## 2019-05-03 DIAGNOSIS — I4891 Unspecified atrial fibrillation: Secondary | ICD-10-CM | POA: Diagnosis not present

## 2019-05-05 DIAGNOSIS — R262 Difficulty in walking, not elsewhere classified: Secondary | ICD-10-CM | POA: Diagnosis not present

## 2019-05-05 DIAGNOSIS — R2681 Unsteadiness on feet: Secondary | ICD-10-CM | POA: Diagnosis not present

## 2019-05-05 DIAGNOSIS — I10 Essential (primary) hypertension: Secondary | ICD-10-CM | POA: Diagnosis not present

## 2019-05-06 DIAGNOSIS — R2681 Unsteadiness on feet: Secondary | ICD-10-CM | POA: Diagnosis not present

## 2019-05-06 DIAGNOSIS — R262 Difficulty in walking, not elsewhere classified: Secondary | ICD-10-CM | POA: Diagnosis not present

## 2019-05-06 DIAGNOSIS — I10 Essential (primary) hypertension: Secondary | ICD-10-CM | POA: Diagnosis not present

## 2019-05-07 DIAGNOSIS — I10 Essential (primary) hypertension: Secondary | ICD-10-CM | POA: Diagnosis not present

## 2019-05-07 DIAGNOSIS — R2681 Unsteadiness on feet: Secondary | ICD-10-CM | POA: Diagnosis not present

## 2019-05-07 DIAGNOSIS — R262 Difficulty in walking, not elsewhere classified: Secondary | ICD-10-CM | POA: Diagnosis not present

## 2019-05-10 DIAGNOSIS — I4891 Unspecified atrial fibrillation: Secondary | ICD-10-CM | POA: Diagnosis not present

## 2019-05-10 DIAGNOSIS — R262 Difficulty in walking, not elsewhere classified: Secondary | ICD-10-CM | POA: Diagnosis not present

## 2019-05-10 DIAGNOSIS — I10 Essential (primary) hypertension: Secondary | ICD-10-CM | POA: Diagnosis not present

## 2019-05-10 DIAGNOSIS — Z79899 Other long term (current) drug therapy: Secondary | ICD-10-CM | POA: Diagnosis not present

## 2019-05-10 DIAGNOSIS — R2681 Unsteadiness on feet: Secondary | ICD-10-CM | POA: Diagnosis not present

## 2019-05-10 DIAGNOSIS — L209 Atopic dermatitis, unspecified: Secondary | ICD-10-CM | POA: Diagnosis not present

## 2019-05-10 DIAGNOSIS — J36 Peritonsillar abscess: Secondary | ICD-10-CM | POA: Diagnosis not present

## 2019-05-10 DIAGNOSIS — Z7901 Long term (current) use of anticoagulants: Secondary | ICD-10-CM | POA: Diagnosis not present

## 2019-05-11 DIAGNOSIS — Z79899 Other long term (current) drug therapy: Secondary | ICD-10-CM | POA: Diagnosis not present

## 2019-05-11 DIAGNOSIS — D649 Anemia, unspecified: Secondary | ICD-10-CM | POA: Diagnosis not present

## 2019-05-12 DIAGNOSIS — R221 Localized swelling, mass and lump, neck: Secondary | ICD-10-CM | POA: Diagnosis not present

## 2019-05-12 DIAGNOSIS — I10 Essential (primary) hypertension: Secondary | ICD-10-CM | POA: Diagnosis not present

## 2019-05-12 DIAGNOSIS — R262 Difficulty in walking, not elsewhere classified: Secondary | ICD-10-CM | POA: Diagnosis not present

## 2019-05-12 DIAGNOSIS — R2681 Unsteadiness on feet: Secondary | ICD-10-CM | POA: Diagnosis not present

## 2019-05-13 DIAGNOSIS — Z79899 Other long term (current) drug therapy: Secondary | ICD-10-CM | POA: Diagnosis not present

## 2019-05-13 DIAGNOSIS — R262 Difficulty in walking, not elsewhere classified: Secondary | ICD-10-CM | POA: Diagnosis not present

## 2019-05-13 DIAGNOSIS — Z7901 Long term (current) use of anticoagulants: Secondary | ICD-10-CM | POA: Diagnosis not present

## 2019-05-13 DIAGNOSIS — R69 Illness, unspecified: Secondary | ICD-10-CM | POA: Diagnosis not present

## 2019-05-13 DIAGNOSIS — J029 Acute pharyngitis, unspecified: Secondary | ICD-10-CM | POA: Diagnosis not present

## 2019-05-13 DIAGNOSIS — R2681 Unsteadiness on feet: Secondary | ICD-10-CM | POA: Diagnosis not present

## 2019-05-13 DIAGNOSIS — I4891 Unspecified atrial fibrillation: Secondary | ICD-10-CM | POA: Diagnosis not present

## 2019-05-13 DIAGNOSIS — R21 Rash and other nonspecific skin eruption: Secondary | ICD-10-CM | POA: Diagnosis not present

## 2019-05-13 DIAGNOSIS — I10 Essential (primary) hypertension: Secondary | ICD-10-CM | POA: Diagnosis not present

## 2019-05-14 DIAGNOSIS — R2681 Unsteadiness on feet: Secondary | ICD-10-CM | POA: Diagnosis not present

## 2019-05-14 DIAGNOSIS — I10 Essential (primary) hypertension: Secondary | ICD-10-CM | POA: Diagnosis not present

## 2019-05-14 DIAGNOSIS — R262 Difficulty in walking, not elsewhere classified: Secondary | ICD-10-CM | POA: Diagnosis not present

## 2019-05-15 ENCOUNTER — Emergency Department (HOSPITAL_COMMUNITY): Payer: Medicare HMO

## 2019-05-15 ENCOUNTER — Inpatient Hospital Stay (HOSPITAL_COMMUNITY)
Admission: EM | Admit: 2019-05-15 | Discharge: 2019-05-21 | DRG: 392 | Disposition: A | Discharge status: Skilled Nursing Facility | Payer: Medicare HMO | Source: Skilled Nursing Facility | Attending: Internal Medicine | Admitting: Internal Medicine

## 2019-05-15 ENCOUNTER — Other Ambulatory Visit: Payer: Self-pay

## 2019-05-15 ENCOUNTER — Encounter (HOSPITAL_COMMUNITY): Payer: Self-pay

## 2019-05-15 DIAGNOSIS — Z9049 Acquired absence of other specified parts of digestive tract: Secondary | ICD-10-CM

## 2019-05-15 DIAGNOSIS — E785 Hyperlipidemia, unspecified: Secondary | ICD-10-CM | POA: Diagnosis present

## 2019-05-15 DIAGNOSIS — K2100 Gastro-esophageal reflux disease with esophagitis, without bleeding: Secondary | ICD-10-CM | POA: Diagnosis present

## 2019-05-15 DIAGNOSIS — Z87730 Personal history of (corrected) cleft lip and palate: Secondary | ICD-10-CM

## 2019-05-15 DIAGNOSIS — Z87891 Personal history of nicotine dependence: Secondary | ICD-10-CM

## 2019-05-15 DIAGNOSIS — N182 Chronic kidney disease, stage 2 (mild): Secondary | ICD-10-CM | POA: Diagnosis present

## 2019-05-15 DIAGNOSIS — E1122 Type 2 diabetes mellitus with diabetic chronic kidney disease: Secondary | ICD-10-CM | POA: Diagnosis present

## 2019-05-15 DIAGNOSIS — J45909 Unspecified asthma, uncomplicated: Secondary | ICD-10-CM | POA: Diagnosis present

## 2019-05-15 DIAGNOSIS — I480 Paroxysmal atrial fibrillation: Secondary | ICD-10-CM | POA: Diagnosis present

## 2019-05-15 DIAGNOSIS — Z8349 Family history of other endocrine, nutritional and metabolic diseases: Secondary | ICD-10-CM

## 2019-05-15 DIAGNOSIS — I10 Essential (primary) hypertension: Secondary | ICD-10-CM | POA: Diagnosis not present

## 2019-05-15 DIAGNOSIS — M47812 Spondylosis without myelopathy or radiculopathy, cervical region: Secondary | ICD-10-CM | POA: Diagnosis present

## 2019-05-15 DIAGNOSIS — E669 Obesity, unspecified: Secondary | ICD-10-CM | POA: Diagnosis present

## 2019-05-15 DIAGNOSIS — Z833 Family history of diabetes mellitus: Secondary | ICD-10-CM

## 2019-05-15 DIAGNOSIS — M4854XD Collapsed vertebra, not elsewhere classified, thoracic region, subsequent encounter for fracture with routine healing: Secondary | ICD-10-CM | POA: Diagnosis present

## 2019-05-15 DIAGNOSIS — Z9089 Acquired absence of other organs: Secondary | ICD-10-CM

## 2019-05-15 DIAGNOSIS — K222 Esophageal obstruction: Secondary | ICD-10-CM

## 2019-05-15 DIAGNOSIS — K2970 Gastritis, unspecified, without bleeding: Secondary | ICD-10-CM

## 2019-05-15 DIAGNOSIS — Z8249 Family history of ischemic heart disease and other diseases of the circulatory system: Secondary | ICD-10-CM

## 2019-05-15 DIAGNOSIS — Z86711 Personal history of pulmonary embolism: Secondary | ICD-10-CM

## 2019-05-15 DIAGNOSIS — E039 Hypothyroidism, unspecified: Secondary | ICD-10-CM | POA: Diagnosis present

## 2019-05-15 DIAGNOSIS — Z66 Do not resuscitate: Secondary | ICD-10-CM | POA: Diagnosis present

## 2019-05-15 DIAGNOSIS — R079 Chest pain, unspecified: Secondary | ICD-10-CM | POA: Diagnosis not present

## 2019-05-15 DIAGNOSIS — Z86010 Personal history of colonic polyps: Secondary | ICD-10-CM

## 2019-05-15 DIAGNOSIS — E1142 Type 2 diabetes mellitus with diabetic polyneuropathy: Secondary | ICD-10-CM | POA: Diagnosis present

## 2019-05-15 DIAGNOSIS — R471 Dysarthria and anarthria: Secondary | ICD-10-CM | POA: Diagnosis present

## 2019-05-15 DIAGNOSIS — K219 Gastro-esophageal reflux disease without esophagitis: Secondary | ICD-10-CM | POA: Diagnosis present

## 2019-05-15 DIAGNOSIS — Z90710 Acquired absence of both cervix and uterus: Secondary | ICD-10-CM

## 2019-05-15 DIAGNOSIS — Z885 Allergy status to narcotic agent status: Secondary | ICD-10-CM

## 2019-05-15 DIAGNOSIS — Z6841 Body Mass Index (BMI) 40.0 and over, adult: Secondary | ICD-10-CM

## 2019-05-15 DIAGNOSIS — Z7901 Long term (current) use of anticoagulants: Secondary | ICD-10-CM

## 2019-05-15 DIAGNOSIS — Z79890 Hormone replacement therapy (postmenopausal): Secondary | ICD-10-CM

## 2019-05-15 DIAGNOSIS — K582 Mixed irritable bowel syndrome: Secondary | ICD-10-CM | POA: Diagnosis present

## 2019-05-15 DIAGNOSIS — I700 Atherosclerosis of aorta: Secondary | ICD-10-CM | POA: Diagnosis present

## 2019-05-15 DIAGNOSIS — F329 Major depressive disorder, single episode, unspecified: Secondary | ICD-10-CM | POA: Diagnosis present

## 2019-05-15 DIAGNOSIS — G4733 Obstructive sleep apnea (adult) (pediatric): Secondary | ICD-10-CM | POA: Diagnosis present

## 2019-05-15 DIAGNOSIS — R1310 Dysphagia, unspecified: Secondary | ICD-10-CM

## 2019-05-15 DIAGNOSIS — Z881 Allergy status to other antibiotic agents status: Secondary | ICD-10-CM

## 2019-05-15 DIAGNOSIS — I2510 Atherosclerotic heart disease of native coronary artery without angina pectoris: Secondary | ICD-10-CM | POA: Diagnosis present

## 2019-05-15 DIAGNOSIS — M1990 Unspecified osteoarthritis, unspecified site: Secondary | ICD-10-CM | POA: Diagnosis present

## 2019-05-15 DIAGNOSIS — Z801 Family history of malignant neoplasm of trachea, bronchus and lung: Secondary | ICD-10-CM

## 2019-05-15 DIAGNOSIS — Z807 Family history of other malignant neoplasms of lymphoid, hematopoietic and related tissues: Secondary | ICD-10-CM

## 2019-05-15 DIAGNOSIS — Z955 Presence of coronary angioplasty implant and graft: Secondary | ICD-10-CM

## 2019-05-15 DIAGNOSIS — I252 Old myocardial infarction: Secondary | ICD-10-CM

## 2019-05-15 DIAGNOSIS — D649 Anemia, unspecified: Secondary | ICD-10-CM | POA: Diagnosis present

## 2019-05-15 DIAGNOSIS — I129 Hypertensive chronic kidney disease with stage 1 through stage 4 chronic kidney disease, or unspecified chronic kidney disease: Secondary | ICD-10-CM | POA: Diagnosis present

## 2019-05-15 DIAGNOSIS — Z79899 Other long term (current) drug therapy: Secondary | ICD-10-CM

## 2019-05-15 DIAGNOSIS — Z20828 Contact with and (suspected) exposure to other viral communicable diseases: Secondary | ICD-10-CM | POA: Diagnosis present

## 2019-05-15 DIAGNOSIS — R52 Pain, unspecified: Secondary | ICD-10-CM | POA: Diagnosis not present

## 2019-05-15 DIAGNOSIS — Z87442 Personal history of urinary calculi: Secondary | ICD-10-CM

## 2019-05-15 LAB — CBC WITH DIFFERENTIAL/PLATELET
Abs Immature Granulocytes: 0.02 10*3/uL (ref 0.00–0.07)
Basophils Absolute: 0 10*3/uL (ref 0.0–0.1)
Basophils Relative: 0 %
Eosinophils Absolute: 0.2 10*3/uL (ref 0.0–0.5)
Eosinophils Relative: 2 %
HCT: 39.7 % (ref 36.0–46.0)
Hemoglobin: 11.8 g/dL — ABNORMAL LOW (ref 12.0–15.0)
Immature Granulocytes: 0 %
Lymphocytes Relative: 20 %
Lymphs Abs: 1.8 10*3/uL (ref 0.7–4.0)
MCH: 28 pg (ref 26.0–34.0)
MCHC: 29.7 g/dL — ABNORMAL LOW (ref 30.0–36.0)
MCV: 94.3 fL (ref 80.0–100.0)
Monocytes Absolute: 0.6 10*3/uL (ref 0.1–1.0)
Monocytes Relative: 7 %
Neutro Abs: 6.1 10*3/uL (ref 1.7–7.7)
Neutrophils Relative %: 71 %
Platelets: 279 10*3/uL (ref 150–400)
RBC: 4.21 MIL/uL (ref 3.87–5.11)
RDW: 14.5 % (ref 11.5–15.5)
WBC: 8.7 10*3/uL (ref 4.0–10.5)
nRBC: 0 % (ref 0.0–0.2)

## 2019-05-15 LAB — BASIC METABOLIC PANEL
Anion gap: 9 (ref 5–15)
BUN: 17 mg/dL (ref 8–23)
CO2: 30 mmol/L (ref 22–32)
Calcium: 9.1 mg/dL (ref 8.9–10.3)
Chloride: 103 mmol/L (ref 98–111)
Creatinine, Ser: 0.83 mg/dL (ref 0.44–1.00)
GFR calc Af Amer: >60 mL/min (ref >60)
GFR calc non Af Amer: >60 mL/min (ref >60)
Glucose, Bld: 92 mg/dL (ref 70–99)
Potassium: 4.7 mmol/L (ref 3.5–5.1)
Sodium: 142 mmol/L (ref 135–145)

## 2019-05-15 LAB — PROTIME-INR
INR: 2.2 — ABNORMAL HIGH (ref 0.8–1.2)
Prothrombin Time: 24.1 seconds — ABNORMAL HIGH (ref 11.4–15.2)

## 2019-05-15 MED ORDER — IOHEXOL 300 MG/ML  SOLN: 75.0000 mL | Freq: Once | INTRAMUSCULAR | Status: DC | PRN

## 2019-05-15 MED ORDER — PHYTONADIONE 5 MG PO TABS
2.5000 mg | ORAL_TABLET | Freq: Once | ORAL | Status: AC
  Administered 2019-05-15: 2.5 mg via ORAL
  Filled 2019-05-15: qty 1

## 2019-05-15 MED ORDER — IOHEXOL 300 MG/ML  SOLN
75.0000 mL | Freq: Once | INTRAMUSCULAR | Status: AC | PRN
  Administered 2019-05-15: 75 mL via INTRAVENOUS

## 2019-05-15 NOTE — Plan of Care (Addendum)
EMR REVIEWED. PMHx: DYSPHAGIA/ESOPHAGEAL MOTILITY DISORDER. TREATED FEB 2020 FOR TONSILLAR ABSCESS. DOUBT PT HAS IMPACTION, LAST EGD/DIL 2017 WIDELY PATENT EGJ. BPE NOV 2019 NO OBSTRUCTION OF TABLET. AWAITING CT CHEST. DISCUSSED WITH MS. IDOL & TRIPLETT. CONSIDER CT NECK IF PT HAVING THROAT PAIN.  FULL CONSULT TO FOLLOW.

## 2019-05-15 NOTE — ED Triage Notes (Signed)
Pt reports pain in right side of throat with swallowing.   Pt has hx of pain in left side of throat with swallowing.   Pt from [facility].

## 2019-05-15 NOTE — ED Provider Notes (Signed)
Patient signed out to me by Evalee Jefferson, PA, PA-C at end of shift pending noncontrasted CT of chest to rule out chest mass.  Pt here from [place] [facility] with dysphagia, history of the same.  Complains of throat pain and difficulty swallowing solids or liquids.  Symptoms began after eating oatmeal yesterday.  last esophageal dilatation was 2017.  Previous provider spoke with GI who request patient be admitted to hospitalist service for likely EGD  tomorrow.  1810  I spoke with Dr. Oneida Alar who reviewed pt chart and expressed concern that patient may have tonsillar issue given the patient has also had previous peritonsillar abscess.  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  There is no tonsillar edema or bulging of the soft palate.  No exudate.    Dg Chest 2 View  Result Date: 05/15/2019 CLINICAL DATA:  Chest pain EXAM: CHEST - 2 VIEW COMPARISON:  04/25/2018 FINDINGS: The heart size and mediastinal contours are enlarged, stable. Calcific aortic knob. No focal airspace consolidation, pleural effusion, or pneumothorax. IMPRESSION: No active cardiopulmonary disease. Electronically Signed   By: Davina Poke M.D.   On: 05/15/2019 15:09   Ct Soft Tissue Neck W Contrast  Result Date: 05/15/2019 CLINICAL DATA:  Initial evaluation for acute pain with swallowing since yesterday. EXAM: CT NECK WITH CONTRAST TECHNIQUE: Multidetector CT imaging of the neck was performed using the standard protocol following the bolus administration of intravenous contrast. CONTRAST:  42mL OMNIPAQUE IOHEXOL 300 MG/ML  SOLN COMPARISON:  None available. FINDINGS: Pharynx and larynx: Oral cavity within normal limits without discrete mass or collection. No acute inflammatory changes seen about the dentition. Palatine tonsils symmetric and within normal limits. Parapharyngeal fat maintained. Remainder of the  oropharynx and visualized nasopharynx within normal limits. No retropharyngeal collection. Epiglottis within normal limits. Vallecula clear. Remainder of the hypopharynx and supraglottic larynx within normal limits without appreciable swelling or inflammatory changes. True cords are opposed and not well assessed, although grossly symmetric and within normal limits. Subglottic airway clear. Salivary glands: Salivary glands including the parotid and submandibular glands are within normal limits. No sialolithiasis or acute sialoadenitis. Thyroid: Thyroid within normal limits. Lymph nodes: No pathologically enlarged or concerning adenopathy seen within the neck. Vascular: Moderate atherosclerotic change present about the aortic arch and carotid bifurcations. Associated stenosis of up to approximately 60% noted at the origin of the left ICA. Right carotid artery system medialized into the retropharyngeal space. Limited intracranial: Unremarkable. Visualized orbits: Globes and orbital soft tissues not visualized on this exam. Mastoids and visualized paranasal sinuses: Visualized maxillary sinuses are clear. Mastoid air cells and middle ear cavities not included on this exam. Skeleton: No acute osseous abnormality. Sclerotic lesion within the C6 vertebral body most characteristic of a benign bone island. No other discrete or worrisome osseous lesions. Moderate cervical spondylosis noted at C5-6. Moderate Upper chest: Visualized upper chest demonstrates no acute finding. Other: None. IMPRESSION: 1. Negative CT of the neck. No acute inflammatory changes or other abnormality identified. 2. Aortic Atherosclerosis (ICD10-I70.0). 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 3. Moderate cervical spondylosis at C5-6. Electronically Signed   By: Jeannine Boga M.D.   On: 05/15/2019 21:34   Ct Chest Wo Contrast  Result Date: 05/15/2019 CLINICAL DATA:  Unexplained dysphagia. EXAM: CT CHEST WITHOUT  CONTRAST TECHNIQUE: Multidetector CT imaging of the chest was performed following the  standard protocol without IV contrast. COMPARISON:  None. FINDINGS: Cardiovascular: The heart size is relatively normal. Aortic calcifications are noted. The main pulmonary artery is significantly dilated measuring 3.8 cm in diameter. Mediastinum/Nodes: --No mediastinal or hilar lymphadenopathy. --No axillary lymphadenopathy. --No supraclavicular lymphadenopathy. --Normal thyroid gland. --The esophagus is unremarkable Lungs/Pleura: No pulmonary nodules or masses. No pleural effusion or pneumothorax. No focal airspace consolidation. No focal pleural abnormality. Upper Abdomen: The patient is status post cholecystectomy. Musculoskeletal: There are multilevel compression fractures throughout the thoracic spine, all of which appear to be chronic. There is a calcified left breast mass, presumably benign. Review of the MIP images confirms the above findings. IMPRESSION: 1. No acute cardiopulmonary process. 2. The main pulmonary artery is significantly dilated measuring 3.8 cm in diameter. This can be seen with pulmonary arterial hypertension. Aortic Atherosclerosis (ICD10-I70.0). Electronically Signed   By: Constance Holster M.D.   On: 05/15/2019 21:10     2200  Consulted hospitalist, Dr. Malen Gauze who agrees to admit.     Kem Parkinson, PA-C 05/15/19 2314    Lucrezia Starch, MD 05/18/19 [phone]

## 2019-05-15 NOTE — ED Provider Notes (Signed)
[facility] EMERGENCY DEPARTMENT Provider Note   CSN: [id number] Arrival date & time: 05/15/19  1406     History   Chief Complaint Chief Complaint  Patient presents with  . throat pain with swallowing    HPI [name] is a 78 y.o. female with a history of cleft palate, CAD with MI, paroxysmal afib on coumadin, asthma, DM, HTN and history of esophageal strictures last undergoing dilitation in 2017 (Dr. Laural Golden) presenting with inability to swallow her food or liquids since yesterday morning, stating she is regurgitating solid and liquids, reporting intermittently also bringing up saliva. She was eating oatmeal yesterday when the symptoms first began.  She reports pain with swallowing which starts in her throat and radiates to her right upper abdomen.  She has found no alleviators for her symptoms.  She denies complaint of chronic heartburn, denies nausea.  She denies fevers, chills, sob, cough or choking episodes.        The history is provided by the patient.    Past Medical History:  Diagnosis Date  . Allergy   . Allergy history unknown   . Arthritis   . ASCVD (arteriosclerotic cardiovascular disease)    stent to mid and proximal left anterior descending in 06/2002;drug eluting stent placed in the second diagnol in 08/2003 after  A  non-st elevation myocardial infarction   . Asthma   . Atrial fibrillation ([place])   . Chronic anticoagulation   . Chronic nausea   . Chronic pain of left knee   . Coronary artery disease   . Current chronic use of systemic steroids 05/11/2016  . Depression   . Diabetes mellitus    no insulin  . Diarrhea    acute  . Diverticulitis of intestine   . FH: colonic polyps    adenomataous  . GERD (gastroesophageal reflux disease)   . History of kidney stones   . Hyperlipidemia    pulmonary embolism 2000 and 09/2008  . Hypertension   . Hypothyroidism   . Insomnia   . Irritable bowel syndrome   . Myocardial infarction ([place])   . Paroxysmal  atrial fibrillation ([facility])    normal LV function; episodes occurred in 205 and 09/2007  . Pedal edema   . Peripheral edema   . PONV (postoperative nausea and vomiting)   . Pulmonary embolism ([place])    2000/09/2008  . Rectal bleeding   . Sleep apnea   . Thyroid disease    hypothyroidism  . Tobacco user    stopped   . Venous stasis     Patient Active Problem List   Diagnosis Date Noted  . Tonsillar abscess 08/13/2018  . AF (paroxysmal atrial fibrillation) ([place]) 08/13/2018  . Nausea vomiting and diarrhea 05/11/2016  . Obesity 05/11/2016  . 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 Insomnia 04/15/2016  . Hyperkalemia 04/08/2016  . Volume depletion 04/08/2016  . AKI (acute kidney injury) ([place]) 04/08/2016  . Depression 04/07/2016  . Nausea without vomiting 05/09/2014  . Melena 05/08/2014  . UGI bleed 05/08/2014  . Dysphagia, unspecified(787.20) 10/26/2013  . Difficulty walking 10/19/2012  . Weakness of left leg 10/19/2012  . Long term current use of anticoagulant therapy 09/26/2012  . Dysphagia 05/04/2012  . Microcytic anemia 08/10/2011  . Acute respiratory failure ([place]) 08/09/2011  . IBS (irritable bowel syndrome) 08/09/2011  . DOE (dyspnea on exertion) 03/05/2011  . Anemia 03/05/2011  . Peripheral neuropathy 10/02/2010  . 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  DIARRHEA, ACUTE 12/05/2008  . Hypothyroidism 12/02/2008  . Controlled type 2 diabetes mellitus ([place]) 12/02/2008  . Hyperlipidemia LDL goal <70 12/02/2008  . Essential hypertension 12/02/2008  . Coronary atherosclerosis 12/02/2008  . Chronic atrial fibrillation ([place]) 12/02/2008  . Asthma 12/02/2008  . GERD 12/02/2008  . TOBACCO USE, QUIT 12/02/2008    Past Surgical History:  Procedure Laterality Date  . ABDOMINAL HYSTERECTOMY    . APPENDECTOMY    . BALLOON DILATION N/A 11/11/2013   Procedure: BALLOON DILATION;  Surgeon: Rogene Houston, MD;  Location: [facility] ENDO  SU[facility];  Service: Endoscopy;  Laterality: N/A;  . BREAST REDUCTION SURGERY    . BREAST SURGERY    . CHOLECYSTECTOMY    . CLEFT PALATE REPAIR     4 surgeries  . COLONOSCOPY  2011   negative  . COLONOSCOPY N/A 05/10/2014   Procedure: COLONOSCOPY;  Surgeon: Rogene Houston, MD;  Location: [facility] SU[facility];  Service: Endoscopy;  Laterality: N/A;  . ESOPHAGEAL DILATION N/A 03/08/2016   Procedure: ESOPHAGEAL DILATION;  Surgeon: Rogene Houston, MD;  Location: [facility];  Service: Endoscopy;  Laterality: N/A;  . ESOPHAGOGASTRODUODENOSCOPY N/A 11/11/2013   Procedure: ESOPHAGOGASTRODUODENOSCOPY (EGD);  Surgeon: Rogene Houston, MD;  Location: [facility];  Service: Endoscopy;  Laterality: N/A;  200-moved to 100 Ann notified pt  . ESOPHAGOGASTRODUODENOSCOPY N/A 05/09/2014   Procedure: ESOPHAGOGASTRODUODENOSCOPY (EGD);  Surgeon: Rogene Houston, MD;  Location: [facility];  Service: Endoscopy;  Laterality: N/A;  . ESOPHAGOGASTRODUODENOSCOPY N/A 03/08/2016   Procedure: ESOPHAGOGASTRODUODENOSCOPY (EGD);  Surgeon: Rogene Houston, MD;  Location: [facility];  Service: Endoscopy;  Laterality: N/A;  2:20  . HEEL SPUR SURGERY    . KNEE SURGERY    . MALONEY DILATION N/A 11/11/2013   Procedure: Venia Minks DILATION;  Surgeon: Rogene Houston, MD;  Location: [facility];  Service: Endoscopy;  Laterality: N/A;  . OOPHORECTOMY    . SAVORY DILATION N/A 11/11/2013   Procedure: SAVORY DILATION;  Surgeon: Rogene Houston, MD;  Location: [facility];  Service: Endoscopy;  Laterality: N/A;  . TONSILLECTOMY       OB History   No obstetric history on file.      Home Medications    Prior to Admission medications   Medication Sig Start Date End Date Taking? Authorizing Provider  acetaminophen (TYLENOL) 500 MG tablet Take 500 mg by mouth daily as needed for headache.   Yes [provider]  albuterol (PROVENTIL) (2.5 MG/3ML) 0.083% nebulizer solution Take 3 mLs (2.5 mg total) by nebulization every 6  (six) hours as needed for wheezing or shortness of breath. 06/05/16  Yes Mikey Kirschner, MD  albuterol (VENTOLIN HFA) 108 (90 Base) MCG/ACT inhaler INHALE 2 PUFFS INTO THE LUNGS 4 TIMES DAILY AS NEEDED FOR WHEEZING. 03/04/18  Yes Mikey Kirschner, MD  atorvastatin (LIPITOR) 40 MG tablet TAKE ONE TABLET BY MOUTH DAILY. Patient taking differently: Take 40 mg by mouth every evening.  06/22/18  Yes Herminio Commons, MD  Cholecalciferol (VITAMIN D3) 1000 units CAPS Take 1 capsule every evening by mouth.    Yes [provider]  conjugated estrogens (PREMARIN) vaginal cream Place 1 Applicatorful vaginally 2 (two) times a week. On Mondays and Fridays   Yes [provider]  diltiazem (CARDIZEM CD) 180 MG 24 hr capsule TAKE ONE CAPSULE BY MOUTH DAILY. Patient taking differently: Take 180 mg by mouth daily.  08/15/16  Yes Herminio Commons, MD  diphenoxylate-atropine (LOMOTIL)  2.5-0.025 MG tablet TAKE ONE TABLET BY MOUTH 2 TIMES A DAY AS NEEDED. Patient taking differently: Take 1 tablet by mouth every 12 (twelve) hours as needed for diarrhea or loose stools.  08/10/18  Yes Setzer, Rona Ravens, NP  doxycycline (VIBRA-TABS) 100 MG tablet Take 100 mg by mouth 2 (two) times daily. 5 day course starting on 05/10/2019   Yes [provider]  escitalopram (LEXAPRO) 20 MG tablet TAKE ONE TABLET BY MOUTH ONCE DAILY. Patient taking differently: Take 20 mg by mouth daily.  03/04/18  Yes Mikey Kirschner, MD  furosemide (LASIX) 40 MG tablet TAKE ONE TABLET BY MOUTH DAILY. Patient taking differently: Take 20 mg by mouth daily.  08/18/18  Yes Herminio Commons, MD  hydrocortisone cream 1 % Apply 1 application topically 2 (two) times daily.   Yes [provider]  levothyroxine (SYNTHROID, LEVOTHROID) 75 MCG tablet Take 1 tablet (75 mcg total) by mouth daily. 06/29/18  Yes Mikey Kirschner, MD  losartan (COZAAR) 25 MG tablet TAKE 1 TABLET BY MOUTH ONCE DAILY. Patient taking  differently: Take 25 mg by mouth daily.  02/03/18  Yes Herminio Commons, MD  meclizine (ANTIVERT) 25 MG tablet Take 1 tablet (25 mg total) by mouth 3 (three) times daily as needed for dizziness. Patient taking differently: Take 25 mg by mouth 2 (two) times daily.  04/14/18  Yes Mikey Kirschner, MD  nitroGLYCERIN (NITROLINGUAL) 0.4 MG/SPRAY spray Place 1 spray under the tongue every 5 (five) minutes x 3 doses as needed for chest pain. 06/04/16  Yes Herminio Commons, MD  pantoprazole (PROTONIX) 40 MG tablet Take 1 tablet (40 mg total) by mouth daily. 08/18/18  Yes Ghimire, Henreitta Leber, MD  Potassium Chloride ER 20 MEQ TBCR One po daily Patient taking differently: Take 20 mEq by mouth daily.  08/15/17  Yes Mikey Kirschner, MD  simethicone (MYLICON) 154 MG chewable tablet Chew 125 mg by mouth 3 (three) times daily with meals.   Yes [provider]  triamcinolone cream (KENALOG) 0.1 % Apply 1 application topically 2 (two) times daily. 08/18/18  Yes Mikey Kirschner, MD  warfarin (COUMADIN) 5 MG tablet TAKE ONE TABLET BY MOUTH ONCE DAILY. TAKE AS DIRECTED BY DOCTOR. Patient taking differently: Take 5 mg by mouth every evening.  07/20/18  Yes Mikey Kirschner, MD  ferrous sulfate 325 (65 FE) MG tablet Take 1 tablet (325 mg total) by mouth daily with breakfast. IRON SUPPLEMENT. 08/12/11 09/13/11  Rexene Alberts, MD  ipratropium (ATROVENT) 0.02 % nebulizer solution Take 500 mcg by nebulization every 4 (four) hours as needed. For shortness of breath  09/13/11  [provider]    Family History Family History  Problem Relation Age of Onset  . Heart attack Mother   . Diabetes Mother   . Hyperlipidemia Mother   . Heart attack Father   . Lung cancer Sister   . Lymphoma Brother   . Colon cancer Neg Hx     Social History Social History   Tobacco Use  . Smoking status: Former Smoker    Packs/day: 2.00    Years: 30.00    Pack years: 60.00    Types: Cigarettes    Start date:  05/05/1987  . Smokeless tobacco: Never Used  Substance Use Topics  . Alcohol use: No    Alcohol/week: 0.0 standard drinks  . Drug use: No     Allergies   Cefzil [cefprozil] and Demerol   Review of Systems Review  of Systems  Constitutional: Negative for chills and fever.  HENT: Positive for sore throat and trouble swallowing. Negative for congestion.   Eyes: Negative.   Respiratory: Negative for choking and shortness of breath.   Cardiovascular: Positive for chest pain.  Gastrointestinal: Positive for abdominal pain. Negative for nausea.       No emesis, but regurgitation per hpi.  Genitourinary: Negative.   Musculoskeletal: Negative for arthralgias, joint swelling and neck pain.  Skin: Negative.  Negative for rash and wound.  Neurological: Negative for dizziness, weakness, light-headedness, numbness and headaches.  Psychiatric/Behavioral: Negative.      Physical Exam Updated Vital Signs BP 134/79 (BP Location: Right Arm)   Pulse 68   Temp 99.2 F (37.3 C) (Oral)   Resp 18   Ht 5\' 1"  (1.549 m)   Wt 99.3 kg   SpO2 93%   BMI 41.38 kg/m   Physical Exam Vitals signs and nursing note reviewed.  Constitutional:      Appearance: She is well-developed.  HENT:     Head: Normocephalic and atraumatic.     Comments: Baseline dysarthria, pt has cleft palate.     Mouth/Throat:     Mouth: Mucous membranes are moist.  Eyes:     Conjunctiva/sclera: Conjunctivae normal.  Neck:     Musculoskeletal: Normal range of motion.  Cardiovascular:     Rate and Rhythm: Normal rate and regular rhythm.     Heart sounds: Normal heart sounds.  Pulmonary:     Effort: Pulmonary effort is normal. No respiratory distress.     Breath sounds: Normal breath sounds. No wheezing.  Abdominal:     General: Abdomen is protuberant. Bowel sounds are normal.     Palpations: Abdomen is soft.     Tenderness: There is no abdominal tenderness.  Musculoskeletal: Normal range of motion.  Skin:     General: Skin is warm and dry.  Neurological:     Mental Status: She is alert.      ED Treatments / Results  Labs (all labs ordered are listed, but only abnormal results are displayed) Labs Reviewed  CBC WITH DIFFERENTIAL/PLATELET - Abnormal; Notable for the following components:      Result Value   Hemoglobin 11.8 (*)    MCHC 29.7 (*)    All other components within normal limits  PROTIME-INR - Abnormal; Notable for the following components:   Prothrombin Time 24.1 (*)    INR 2.2 (*)    All other components within normal limits  BASIC METABOLIC PANEL    EKG None  Radiology Dg Chest 2 View  Result Date: 05/15/2019 CLINICAL DATA:  Chest pain EXAM: CHEST - 2 VIEW COMPARISON:  04/25/2018 FINDINGS: The heart size and mediastinal contours are enlarged, stable. Calcific aortic knob. No focal airspace consolidation, pleural effusion, or pneumothorax. IMPRESSION: No active cardiopulmonary disease. Electronically Signed   By: Davina Poke M.D.   On: 05/15/2019 15:09    Procedures Procedures (including critical care time)  Medications Ordered in ED Medications  phytonadione (VITAMIN K) tablet 2.5 mg (has no administration in time range)     Initial Impression / Assessment and Plan / ED Course  I have reviewed the triage vital signs and the nursing notes.  Pertinent labs & imaging results that were available during my care of the patient were reviewed by me and considered in my medical decision making (see chart for details).        Pt attempted to drink water, was able to drink  1/2 cup prior to regurgitation of fluid.    Call placed to Dr. Oneida Alar. Discussed pt's hx and current sx.  ? If this is impaction vs obstruction as oatmeal should not cause these sx.  She recommended Ct chest non contrast to rule out chest mass.  Vitamin K 2.5 mg now so probable anticipated EGD tomorrow can be performed.  She will see pt tomorrow, plan for medicine admission.   Discussed with  Tammy Triplett PA - C who will arrange admission once CT imaging completed.   Final Clinical Impressions(s) / ED Diagnoses   Final diagnoses:  Dysphagia, unspecified type    ED Discharge Orders    None       Landis Martins 05/15/19 1651    Dorie Rank, MD 05/16/19 [phone]

## 2019-05-15 NOTE — ED Provider Notes (Signed)
Medical screening examination/treatment/procedure(s) were conducted as a shared visit with non-physician practitioner(s) and myself.  I personally evaluated the patient during the encounter.   Patient appears to be handling her secretions.  We will try a fluid challenge to see if she is able to keep liquids down.  If unable to do so she may need GI consultation for esophageal obstruction.   Dorie Rank, MD 05/15/19 [phone]

## 2019-05-16 ENCOUNTER — Encounter (HOSPITAL_COMMUNITY)
Admission: EM | Disposition: A | Discharge status: Skilled Nursing Facility | Payer: Self-pay | Source: Home / Self Care | Attending: Internal Medicine

## 2019-05-16 ENCOUNTER — Other Ambulatory Visit: Payer: Self-pay

## 2019-05-16 DIAGNOSIS — K222 Esophageal obstruction: Secondary | ICD-10-CM | POA: Diagnosis not present

## 2019-05-16 DIAGNOSIS — E785 Hyperlipidemia, unspecified: Secondary | ICD-10-CM

## 2019-05-16 DIAGNOSIS — Z7901 Long term (current) use of anticoagulants: Secondary | ICD-10-CM

## 2019-05-16 DIAGNOSIS — I480 Paroxysmal atrial fibrillation: Secondary | ICD-10-CM

## 2019-05-16 DIAGNOSIS — R1310 Dysphagia, unspecified: Secondary | ICD-10-CM | POA: Diagnosis not present

## 2019-05-16 DIAGNOSIS — I10 Essential (primary) hypertension: Secondary | ICD-10-CM

## 2019-05-16 HISTORY — PX: ESOPHAGEAL DILATION: SHX303

## 2019-05-16 HISTORY — PX: ESOPHAGOGASTRODUODENOSCOPY: SHX5428

## 2019-05-16 LAB — COMPREHENSIVE METABOLIC PANEL
ALT: 12 U/L (ref 0–44)
AST: 17 U/L (ref 15–41)
Albumin: 3.4 g/dL — ABNORMAL LOW (ref 3.5–5.0)
Alkaline Phosphatase: 69 U/L (ref 38–126)
Anion gap: 10 (ref 5–15)
BUN: 16 mg/dL (ref 8–23)
CO2: 28 mmol/L (ref 22–32)
Calcium: 9.1 mg/dL (ref 8.9–10.3)
Chloride: 104 mmol/L (ref 98–111)
Creatinine, Ser: 0.68 mg/dL (ref 0.44–1.00)
GFR calc Af Amer: >60 mL/min (ref >60)
GFR calc non Af Amer: >60 mL/min (ref >60)
Glucose, Bld: 79 mg/dL (ref 70–99)
Potassium: 4.1 mmol/L (ref 3.5–5.1)
Sodium: 142 mmol/L (ref 135–145)
Total Bilirubin: 0.9 mg/dL (ref 0.3–1.2)
Total Protein: 7 g/dL (ref 6.5–8.1)

## 2019-05-16 LAB — CBC
HCT: 43.7 % (ref 36.0–46.0)
Hemoglobin: 13.2 g/dL (ref 12.0–15.0)
MCH: 28 pg (ref 26.0–34.0)
MCHC: 30.2 g/dL (ref 30.0–36.0)
MCV: 92.8 fL (ref 80.0–100.0)
Platelets: 275 10*3/uL (ref 150–400)
RBC: 4.71 MIL/uL (ref 3.87–5.11)
RDW: 14.1 % (ref 11.5–15.5)
WBC: 9 10*3/uL (ref 4.0–10.5)
nRBC: 0 % (ref 0.0–0.2)

## 2019-05-16 LAB — GLUCOSE, CAPILLARY
Glucose-Capillary: 71 mg/dL (ref 70–99)
Glucose-Capillary: 70 mg/dL (ref 70–99)
Glucose-Capillary: 69 mg/dL — ABNORMAL LOW (ref 70–99)
Glucose-Capillary: 142 mg/dL — ABNORMAL HIGH (ref 70–99)
Glucose-Capillary: 108 mg/dL — ABNORMAL HIGH (ref 70–99)

## 2019-05-16 LAB — SARS CORONAVIRUS 2 BY RT PCR (HOSPITAL ORDER, PERFORMED IN ~~LOC~~ HOSPITAL LAB): SARS Coronavirus 2: NEGATIVE

## 2019-05-16 LAB — TSH: TSH: 2.686 u[IU]/mL (ref 0.350–4.500)

## 2019-05-16 LAB — PROTIME-INR
INR: 1.7 — ABNORMAL HIGH (ref 0.8–1.2)
Prothrombin Time: 19.5 seconds — ABNORMAL HIGH (ref 11.4–15.2)

## 2019-05-16 LAB — SARS CORONAVIRUS 2 (TAT 6-24 HRS): SARS Coronavirus 2: NEGATIVE

## 2019-05-16 LAB — MRSA PCR SCREENING: MRSA by PCR: NEGATIVE

## 2019-05-16 SURGERY — EGD (ESOPHAGOGASTRODUODENOSCOPY)
Anesthesia: Moderate Sedation

## 2019-05-16 MED ORDER — MINERAL OIL PO OIL
TOPICAL_OIL | ORAL | Status: AC
  Filled 2019-05-16: qty 30

## 2019-05-16 MED ORDER — LIDOCAINE VISCOUS HCL 2 % MT SOLN
OROMUCOSAL | Status: AC
  Administered 2019-05-17: 10 mL via OROMUCOSAL
  Filled 2019-05-16: qty 15

## 2019-05-16 MED ORDER — STERILE WATER FOR IRRIGATION IR SOLN
Status: DC | PRN
  Administered 2019-05-16: 2.5 mL

## 2019-05-16 MED ORDER — OXYCODONE-ACETAMINOPHEN 5-325 MG PO TABS
1.0000 | ORAL_TABLET | Freq: Three times a day (TID) | ORAL | Status: DC
  Administered 2019-05-17 (×2): 1 via ORAL
  Filled 2019-05-16 (×4): qty 1

## 2019-05-16 MED ORDER — ACETAMINOPHEN 325 MG PO TABS
650.0000 mg | ORAL_TABLET | Freq: Four times a day (QID) | ORAL | Status: DC | PRN
  Administered 2019-05-16 (×2): 650 mg via ORAL
  Filled 2019-05-16 (×2): qty 2

## 2019-05-16 MED ORDER — PANTOPRAZOLE SODIUM 40 MG IV SOLR
40.0000 mg | Freq: Two times a day (BID) | INTRAVENOUS | Status: DC
  Administered 2019-05-16 – 2019-05-19 (×6): 40 mg via INTRAVENOUS
  Filled 2019-05-16 (×6): qty 40

## 2019-05-16 MED ORDER — ATROPINE SULFATE 1 MG/ML IJ SOLN
INTRAMUSCULAR | Status: DC | PRN
  Administered 2019-05-16: .5 mg via INTRAVENOUS

## 2019-05-16 MED ORDER — ATORVASTATIN CALCIUM 40 MG PO TABS
40.0000 mg | ORAL_TABLET | Freq: Every day | ORAL | Status: DC
  Administered 2019-05-16 – 2019-05-21 (×6): 40 mg via ORAL
  Filled 2019-05-16 (×7): qty 1

## 2019-05-16 MED ORDER — ATROPINE SULFATE 1 MG/ML IJ SOLN
INTRAMUSCULAR | Status: AC
  Filled 2019-05-16: qty 1

## 2019-05-16 MED ORDER — ONDANSETRON HCL 4 MG PO TABS: 4.0000 mg | ORAL_TABLET | Freq: Four times a day (QID) | ORAL | Status: DC | PRN

## 2019-05-16 MED ORDER — ESCITALOPRAM OXALATE 10 MG PO TABS
20.0000 mg | ORAL_TABLET | Freq: Every day | ORAL | Status: DC
  Administered 2019-05-16 – 2019-05-21 (×6): 20 mg via ORAL
  Filled 2019-05-16 (×7): qty 2

## 2019-05-16 MED ORDER — DEXTROSE-NACL 5-0.9 % IV SOLN
INTRAVENOUS | Status: AC | PRN
  Administered 2019-05-16: 200 mL via INTRAVENOUS

## 2019-05-16 MED ORDER — LEVOTHYROXINE SODIUM 75 MCG PO TABS
75.0000 ug | ORAL_TABLET | Freq: Every day | ORAL | Status: DC
  Administered 2019-05-17 – 2019-05-21 (×5): 75 ug via ORAL
  Filled 2019-05-16 (×6): qty 1

## 2019-05-16 MED ORDER — LIDOCAINE VISCOUS HCL 2 % MT SOLN
OROMUCOSAL | Status: DC | PRN
  Administered 2019-05-16: 1 via OROMUCOSAL

## 2019-05-16 MED ORDER — PROMETHAZINE HCL 25 MG/ML IJ SOLN
INTRAMUSCULAR | Status: DC | PRN
  Administered 2019-05-16: 6.25 mg via INTRAVENOUS

## 2019-05-16 MED ORDER — FENTANYL CITRATE (PF) 100 MCG/2ML IJ SOLN
INTRAMUSCULAR | Status: DC | PRN
  Administered 2019-05-16: 50 ug via INTRAVENOUS

## 2019-05-16 MED ORDER — ALBUTEROL SULFATE (2.5 MG/3ML) 0.083% IN NEBU: 2.5000 mg | INHALATION_SOLUTION | Freq: Four times a day (QID) | RESPIRATORY_TRACT | Status: DC | PRN

## 2019-05-16 MED ORDER — PANTOPRAZOLE SODIUM 40 MG PO TBEC
40.0000 mg | DELAYED_RELEASE_TABLET | Freq: Every day | ORAL | Status: DC
  Administered 2019-05-16: 40 mg via ORAL
  Filled 2019-05-16 (×2): qty 1

## 2019-05-16 MED ORDER — MIDAZOLAM HCL 5 MG/5ML IJ SOLN
INTRAMUSCULAR | Status: AC
  Filled 2019-05-16: qty 5

## 2019-05-16 MED ORDER — DILTIAZEM HCL ER COATED BEADS 180 MG PO CP24
180.0000 mg | ORAL_CAPSULE | Freq: Every day | ORAL | Status: DC
  Administered 2019-05-16 – 2019-05-20 (×5): 180 mg via ORAL
  Filled 2019-05-16 (×6): qty 1

## 2019-05-16 MED ORDER — LOSARTAN POTASSIUM 50 MG PO TABS
25.0000 mg | ORAL_TABLET | Freq: Every day | ORAL | Status: DC
  Administered 2019-05-16 – 2019-05-20 (×5): 25 mg via ORAL
  Filled 2019-05-16 (×6): qty 1

## 2019-05-16 MED ORDER — FUROSEMIDE 40 MG PO TABS
40.0000 mg | ORAL_TABLET | Freq: Every day | ORAL | Status: DC
  Administered 2019-05-16 – 2019-05-19 (×4): 40 mg via ORAL
  Filled 2019-05-16 (×5): qty 1

## 2019-05-16 MED ORDER — ACETAMINOPHEN 650 MG RE SUPP: 650.0000 mg | Freq: Four times a day (QID) | RECTAL | Status: DC | PRN

## 2019-05-16 MED ORDER — SODIUM CHLORIDE 0.9 % IV SOLN: INTRAVENOUS | Status: DC

## 2019-05-16 MED ORDER — HEPARIN SODIUM (PORCINE) 5000 UNIT/ML IJ SOLN
5000.0000 [IU] | Freq: Three times a day (TID) | INTRAMUSCULAR | Status: DC
  Administered 2019-05-16 – 2019-05-21 (×12): 5000 [IU] via SUBCUTANEOUS
  Filled 2019-05-16 (×14): qty 1

## 2019-05-16 MED ORDER — PROMETHAZINE HCL 25 MG/ML IJ SOLN
INTRAMUSCULAR | Status: AC
  Filled 2019-05-16: qty 1

## 2019-05-16 MED ORDER — SODIUM CHLORIDE 0.9 % IV SOLN
INTRAVENOUS | Status: DC
  Administered 2019-05-16 – 2019-05-18 (×5): via INTRAVENOUS

## 2019-05-16 MED ORDER — FENTANYL CITRATE (PF) 100 MCG/2ML IJ SOLN
INTRAMUSCULAR | Status: AC
  Filled 2019-05-16: qty 2

## 2019-05-16 MED ORDER — MIDAZOLAM HCL 5 MG/5ML IJ SOLN
INTRAMUSCULAR | Status: DC | PRN
  Administered 2019-05-16: 2 mg via INTRAVENOUS

## 2019-05-16 MED ORDER — ONDANSETRON HCL 4 MG/2ML IJ SOLN
4.0000 mg | Freq: Four times a day (QID) | INTRAMUSCULAR | Status: DC | PRN
  Administered 2019-05-16: 4 mg via INTRAVENOUS
  Filled 2019-05-16: qty 2

## 2019-05-16 NOTE — Progress Notes (Addendum)
PROGRESS NOTE  [name]:[id number] DOB: [birth year] DOA: 05/15/2019 PCP: Mikey Kirschner, MD  Brief History:  78 year old female with a history of paroxysmal atrial fibrillation, diabetes mellitus type 2, hypertension, dysphagia, pulmonary embolus, hypothyroidism, and esophageal dysmotility presenting with 1 week history of dysphagia and intermittent odynophagia.  This has resulted in intermittent nausea and vomiting.  Patient is a challenging historian, but in general she states that she had worsening of her dysphagia resulting in vomiting after eating oatmeal on 05/14/2019.  She states that her symptoms have worsened over the past week.  Upon questioning specifically, it appears that her dysphagia difficulties are more with thicker type foods, and not particularly with thin liquids.  It appears that her odynophagia has been intermittent, but she also states that she has chest wall pain with certain types of bodily movements.  She denies any fevers, chills, shortness of breath, coughing, hemoptysis, diarrhea, dysuria, hematuria.  Notably, the patient had an EGD on 03/08/2016 which did not show any endoscopic esophageal abnormalities to explain her dysphagia.  On 05/04/2018, she had a barium swallow study which showed moderate diffuse impaired esophageal motility with mild relative narrowing of her GE junction. In the emergency department, the patient was afebrile hemodynamically stable saturating 95% on room air.  BMP and CBC were essentially unremarkable.  CT of the chest showed dilatation of the main pulmonary artery to 3.8 cm and chronic thoracic spine compression fractures.  CT of the neck showed no acute inflammatory changes but showed cervical spondylosis C5-6.  Assessment/Plan: Dysphagia/odynophagia -GI consult -Clear liquids for now -05/15/2019 CT neck--no acute inflammatory changes -Notably, the patient was recently on a course of doxycycline approximately 1 week prior to  this admission--this may have contributed to some dysphagia/odynophagia -Continue PPI  Chest wall pain/atypical chest pain -Personally reviewed EKG--sinus rhythm, nonspecific T wave change -05/15/2019 CT chest--no lung masses, no consolidation, no edema -Personally reviewed chest x-ray--no edema or consolidation -Personally reviewed EKG--atrial fibrillation, nonspecific T wave change  Paroxysmal atrial fibrillation -Rate controlled -Continue diltiazem CD 180 mg daily -Continue warfarin -restart warfarin when ok with GI  History of PE -restart warfarin when ok with GI  Essential hypertension -Continue losartan and diltiazem CD  Hyperlipidemia -Continue statin  Hypothyroidism -Continue Synthroid         Disposition Plan:   SNF in 1-2 days  Family Communication:   No Family at bedside  Consultants:  none  Code Status:   DNR  DVT Prophylaxis:  [place] Heparin    Procedures: As Listed in Progress Note Above  Antibiotics: None   Total time spent 35 minutes.  Greater than 50% spent face to face counseling and coordinating care.     Subjective: Patient continues to complain of some dysphagia and odynophagia.  She denies any shortness of breath, vomiting, diarrhea, abdominal pain.  She denies any fevers or chills.  Objective: Vitals:   05/15/19 2337 05/16/19 0000 05/16/19 0100 05/16/19 0419  BP: 120/85 126/77 123/72 131/78  Pulse: 97 94 70 62  Resp: (!) 21 13 12 18   Temp:    98.2 F (36.8 C)  TempSrc:    Oral  SpO2: 92% 96% 95% 94%  Weight:      Height:       No intake or output data in the 24 hours ending 05/16/19 1006 Weight change:  Exam:   General:  Pt is alert, follows commands appropriately, not in acute distress  HEENT:  No icterus, No thrush, No neck mass, [place]/AT  Cardiovascular: RRR, S1/S2, no rubs, no gallops  Respiratory: CTA bilaterally, no wheezing, no crackles, no rhonchi  Abdomen: Soft/+BS, non tender, non distended, no  guarding  Extremities: No edema, No lymphangitis, No petechiae, No rashes, no synovitis   Data Reviewed: I have personally reviewed following labs and imaging studies Basic Metabolic Panel: Recent Labs  Lab 05/15/19 1443 05/16/19 0905  NA 142 142  K 4.7 4.1  CL 103 104  CO2 30 28  GLUCOSE 92 79  BUN 17 16  CREATININE 0.83 0.68  CALCIUM 9.1 9.1   Liver Function Tests: Recent Labs  Lab 05/16/19 0905  AST 17  ALT 12  ALKPHOS 69  BILITOT 0.9  PROT 7.0  ALBUMIN 3.4*   No results for input(s): LIPASE, AMYLASE in the last 168 hours. No results for input(s): AMMONIA in the last 168 hours. Coagulation Profile: Recent Labs  Lab 05/15/19 1445 05/16/19 0905  INR 2.2* 1.7*   CBC: Recent Labs  Lab 05/15/19 1443 05/16/19 0905  WBC 8.7 9.0  NEUTROABS 6.1  --   HGB 11.8* 13.2  HCT 39.7 43.7  MCV 94.3 92.8  PLT 279 275   Cardiac Enzymes: No results for input(s): CKTOTAL, CKMB, CKMBINDEX, TROPONINI in the last 168 hours. BNP: Invalid input(s): POCBNP CBG: Recent Labs  Lab 05/16/19 0715  GLUCAP 71   HbA1C: No results for input(s): HGBA1C in the last 72 hours. Urine analysis:    Component Value Date/Time   COLORURINE YELLOW 04/24/2018 1020   APPEARANCEUR HAZY (A) 04/24/2018 1020   LABSPEC 1.029 04/24/2018 1020   PHURINE 6.0 04/24/2018 1020   GLUCOSEU NEGATIVE 04/24/2018 1020   HGBUR SMALL (A) 04/24/2018 1020   BILIRUBINUR NEGATIVE 04/24/2018 1020   KETONESUR NEGATIVE 04/24/2018 1020   PROTEINUR NEGATIVE 04/24/2018 1020   UROBILINOGEN 0.2 05/08/2014 2240   NITRITE NEGATIVE 04/24/2018 1020   LEUKOCYTESUR LARGE (A) 04/24/2018 1020   Sepsis Labs: @LABRCNTIP (procalcitonin:4,lacticidven:4) ) Recent Results (from the past 240 hour(s))  MRSA PCR Screening     Status: None   Collection Time: 05/16/19  4:58 AM   Specimen: Nasopharyngeal  Result Value Ref Range Status   MRSA by PCR NEGATIVE NEGATIVE Final    Comment:        The GeneXpert MRSA Assay  (FDA approved for NASAL specimens only), is one component of a comprehensive MRSA colonization surveillance program. It is not intended to diagnose MRSA infection nor to guide or monitor treatment for MRSA infections. Performed at [facility], [address], [place], [place] [address]      Scheduled Meds:  atorvastatin  40 mg Oral Daily   diltiazem  180 mg Oral Daily   escitalopram  20 mg Oral Daily   furosemide  40 mg Oral Daily   heparin  5,000 Units Subcutaneous Q8H   levothyroxine  75 mcg Oral Daily   losartan  25 mg Oral Daily   pantoprazole  40 mg Oral Daily   Continuous Infusions:  sodium chloride      Procedures/Studies: Dg Chest 2 View  Result Date: 05/15/2019 CLINICAL DATA:  Chest pain EXAM: CHEST - 2 VIEW COMPARISON:  04/25/2018 FINDINGS: The heart size and mediastinal contours are enlarged, stable. Calcific aortic knob. No focal airspace consolidation, pleural effusion, or pneumothorax. IMPRESSION: No active cardiopulmonary disease. Electronically Signed   By: Davina Poke M.D.   On: 05/15/2019 15:09   Ct Soft Tissue Neck W Contrast  Result Date: 05/15/2019 CLINICAL  DATA:  Initial evaluation for acute pain with swallowing since yesterday. EXAM: CT NECK WITH CONTRAST TECHNIQUE: Multidetector CT imaging of the neck was performed using the standard protocol following the bolus administration of intravenous contrast. CONTRAST:  62mL OMNIPAQUE IOHEXOL 300 MG/ML  SOLN COMPARISON:  None available. FINDINGS: Pharynx and larynx: Oral cavity within normal limits without discrete mass or collection. No acute inflammatory changes seen about the dentition. Palatine tonsils symmetric and within normal limits. Parapharyngeal fat maintained. Remainder of the oropharynx and visualized nasopharynx within normal limits. No retropharyngeal collection. Epiglottis within normal limits. Vallecula clear. Remainder of the hypopharynx and supraglottic larynx within normal limits  without appreciable swelling or inflammatory changes. True cords are opposed and not well assessed, although grossly symmetric and within normal limits. Subglottic airway clear. Salivary glands: Salivary glands including the parotid and submandibular glands are within normal limits. No sialolithiasis or acute sialoadenitis. Thyroid: Thyroid within normal limits. Lymph nodes: No pathologically enlarged or concerning adenopathy seen within the neck. Vascular: Moderate atherosclerotic change present about the aortic arch and carotid bifurcations. Associated stenosis of up to approximately 60% noted at the origin of the left ICA. Right carotid artery system medialized into the retropharyngeal space. Limited intracranial: Unremarkable. Visualized orbits: Globes and orbital soft tissues not visualized on this exam. Mastoids and visualized paranasal sinuses: Visualized maxillary sinuses are clear. Mastoid air cells and middle ear cavities not included on this exam. Skeleton: No acute osseous abnormality. Sclerotic lesion within the C6 vertebral body most characteristic of a benign bone island. No other discrete or worrisome osseous lesions. Moderate cervical spondylosis noted at C5-6. Moderate Upper chest: Visualized upper chest demonstrates no acute finding. Other: None. IMPRESSION: 1. Negative CT of the neck. No acute inflammatory changes or other abnormality identified. 2. Aortic Atherosclerosis (ICD10-I70.0). 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 3. Moderate cervical spondylosis at C5-6. Electronically Signed   By: Jeannine Boga M.D.   On: 05/15/2019 21:34   Ct Chest Wo Contrast  Result Date: 05/15/2019 CLINICAL DATA:  Unexplained dysphagia. EXAM: CT CHEST WITHOUT CONTRAST TECHNIQUE: Multidetector CT imaging of the chest was performed following the standard protocol without IV contrast. COMPARISON:  None. FINDINGS: Cardiovascular: The heart size is relatively normal. Aortic  calcifications are noted. The main pulmonary artery is significantly dilated measuring 3.8 cm in diameter. Mediastinum/Nodes: --No mediastinal or hilar lymphadenopathy. --No axillary lymphadenopathy. --No supraclavicular lymphadenopathy. --Normal thyroid gland. --The esophagus is unremarkable Lungs/Pleura: No pulmonary nodules or masses. No pleural effusion or pneumothorax. No focal airspace consolidation. No focal pleural abnormality. Upper Abdomen: The patient is status post cholecystectomy. Musculoskeletal: There are multilevel compression fractures throughout the thoracic spine, all of which appear to be chronic. There is a calcified left breast mass, presumably benign. Review of the MIP images confirms the above findings. IMPRESSION: 1. No acute cardiopulmonary process. 2. The main pulmonary artery is significantly dilated measuring 3.8 cm in diameter. This can be seen with pulmonary arterial hypertension. Aortic Atherosclerosis (ICD10-I70.0). Electronically Signed   By: Constance Holster M.D.   On: 05/15/2019 21:10    Orson Eva, DO  [facility] Pager [phone]  If 7PM-7AM, please contact night-coverage [email or web address] Password [email or web address] 05/16/2019, 10:06 AM   LOS: 0 days

## 2019-05-16 NOTE — Op Note (Signed)
[facility] Patient Name: [name] Procedure Date: 05/16/2019 2:14 PM MRN: [id number] Date of Birth: 06-May-1941 Attending MD: Barney Drain MD, MD CSN: [id number] Age: 78 Admit Type: Inpatient Procedure:                Upper GI endoscopy WITH ESOPHAGEAL DILATION Indications:              Dysphagia, Odynophagia Providers:                Barney Drain MD, MD, Lurline Del, RN, Aram Candela Referring MD:             Rosemary Holms, MD Medicines:                Promethazine 12.5 mg IV, Fentanyl 50 micrograms IV,                            Midazolam 2 mg IV Complications:            No immediate complications. Estimated Blood Loss:     Estimated blood loss was minimal. Procedure:                Pre-Anesthesia Assessment:                           - Prior to the procedure, a History and Physical                            was performed, and patient medications and                            allergies were reviewed. The patient's tolerance of                            previous anesthesia was also reviewed. The risks                            and benefits of the procedure and the sedation                            options and risks were discussed with the patient.                            All questions were answered, and informed consent                            was obtained. Prior Anticoagulants: The patient has                            taken Coumadin (warfarin), last dose was 3 days                            prior to procedure. ASA Grade Assessment: III - A                            patient with severe systemic disease. After  reviewing the risks and benefits, the patient was                            deemed in satisfactory condition to undergo the                            procedure. After obtaining informed consent, the                            endoscope was passed under direct vision.                            Throughout the procedure, the  patient's blood                            pressure, pulse, and oxygen saturations were                            monitored continuously. The [id number]-[id number] ([id number])                            scope was introduced through the mouth, and                            advanced to the second part of duodenum. The                            patient tolerated the procedure well. The upper GI                            endoscopy was technically difficult and complex due                            to narrowing. Scope In: 3:13:56 PM Scope Out: 3:23:33 PM Total Procedure Duration: 0 hours 9 minutes 37 seconds  Findings:      One benign-appearing, intrinsic moderate (circumferential scarring or       stenosis; an endoscope may pass) stenosis was found 30 to 35 cm from the       incisors. This stenosis measured 1 cm (inner diameter) x 5 cm (in       length). The stenosis was traversed. A guidewire was placed and the       scope was withdrawn. Dilation was performed with a Savary dilator with       mild resistance at 11 mm and moderate resistance at 12 mm and 12.8 mm.      The entire examined stomach was normal.      The examined duodenum was normal. Impression:               - ODYNOPHAGIA DUE TO ESOPHAGITIS                           - DYSPHAGIA DUE TO Benign-appearing esophageal                            STRICTURE Moderate Sedation:  Moderate (conscious) sedation was administered by the endoscopy nurse       and supervised by the endoscopist. The following parameters were       monitored: oxygen saturation, heart rate, blood pressure, and response       to care. Total physician intraservice time was 10 minutes. Recommendation:           - Return patient to hospital ward for ongoing care.                           - Full liquid diet. ADVANCE AS TOLERATED.                           - Continue present medications. OK TO RE-START                            COUMADIN ON NOV 23. PROTONIX BI FOR 0NE MONTH  THEN                            ONCE DAILY FOREVER.                           - Repeat upper endoscopy in 1 month for retreatment                            WITH DR. Laural Golden. Procedure Code(s):        --- Professional ---                           [phone], Esophagogastroduodenoscopy, flexible,                            transoral; with insertion of guide wire followed by                            passage of dilator(s) through esophagus over guide                            wire                           [id number], Moderate sedation services provided by the                            same physician or other qualified health care                            professional performing a gastrointestinal                            endoscopic service that sedation supports,                            requiring the presence of an independent trained  observer to assist in the monitoring of the                            patient's level of consciousness and physiological                            status; initial 15 minutes of intra-service time;                            patient age 79 years or older (additional time may                            be reported with [phone], as appropriate) Diagnosis Code(s):        --- Professional ---                           K22.2, Esophageal obstruction                           R13.10, Dysphagia, unspecified CPT copyright 2019 [employer]. All rights reserved. The codes documented in this report are preliminary and upon coder review may  be revised to meet current compliance requirements. Barney Drain, MD Barney Drain MD, MD 05/16/2019 3:35:34 PM This report has been signed electronically. Number of Addenda: 0

## 2019-05-16 NOTE — Interval H&P Note (Signed)
History and Physical Interval Note:  05/16/2019 3:04 PM  [name]  has presented today for surgery, with the diagnosis of DYSPHAGIA.  The various methods of treatment have been discussed with the patient and family. After consideration of risks, benefits and other options for treatment, the patient has consented to  Procedure(s): ESOPHAGOGASTRODUODENOSCOPY (EGD) (N/A) ESOPHAGEAL DILATION (N/A) as a surgical intervention.  The patient's history has been reviewed, patient examined, no change in status, stable for surgery.  I have reviewed the patient's chart and labs.  Questions were answered to the patient's satisfaction.     [employer]

## 2019-05-16 NOTE — H&P (View-Only) (Signed)
Referring Provider: No ref. provider found Primary Care Physician:  Mikey Kirschner, MD Primary Gastroenterologist:  DR. Laural Golden  Reason for Consultation:  DYSPHAGIA/ODYNOPHAGIA   Impression: ADMITTED WITH ODYNOPHAGIA/DYSPHAGIA. DIFFERENTIAL DIAGNOSIS INCLUDES: CANDIDA ESOPHAGITIS, HSV ESOPHAGITIS, EROSIVE ESOPHAGITIS/SCHATZKI'S RING, PRIMARY ESOPHAGEAL MOTILITY DISORDER,OR 2o ESOPHAGEAL MOTILITY DISORDER DUE TO UNCONTROLLED REFLUX, LESS LIKELY ESOPHAGEAL CANCER.  Plan: 1. NPO EXCEPT SIPS WITH MEDS. 2. EGD TODAY. NEEDS PHENERGAN 6.25 MG IV IN PREOP.  DISCUSSED PROCEDURE, BENEFITS, & RISKS: < 1% chance of medication reaction, bleeding, perforation, or ASPIRATION. CALLED AND LVM FOR NIECE([facility]). 3. BID PPI. 4. HOLD COUMADIN.    HPI:  PT HAS HISTORY OF DYSPHAGIA. 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 ONE WEK AGO STARTED HAVIGN PAIN WITH SWALLOWING AND PAIN WITH SWALLOWING. NO STEROIDS OR THRUSH. CHEWS FOOD REALLY WELL AND DRINKS LIQUIDS AND FOOD COMES RIGHT BACK EVEN WITH WATER. ABLE TO SWALLOW HER OWN SPIT. A LITTLE NAUSEA. BMs: USUALLY ONCE A DAY. CHEST HURTS RIGHT DOWN THE MIDDLE. GETTING WORSE. C/O PAIN IN RIGHT SIDE(SQUEEZING) AND MOVE FROM BELLY BUTTON TO RIGHT SIDE.  BEEN GAINING WEIGHT. APPETITE: REALLY GOOD.  PT DENIES FEVER, CHILLS, HEMATOCHEZIA, HEMATEMESIS, nausea, vomiting, melena, diarrhea, SHORTNESS OF BREATH,  CHANGE IN BOWEL IN HABITS, constipation, problems with sedation, OR heartburn or indigestion.  Past Medical History:  Diagnosis Date  . Allergy   . Allergy history unknown   . Arthritis   . ASCVD (arteriosclerotic cardiovascular disease)    stent to mid and proximal left anterior descending in 06/2002;drug eluting stent placed in the second diagnol in 08/2003 after  A  non-st elevation myocardial infarction   . Asthma   . Atrial fibrillation ([place])   . Chronic anticoagulation   . Chronic nausea   . Chronic pain of left knee   . Coronary artery  disease   . Current chronic use of systemic steroids 05/11/2016  . Depression   . Diabetes mellitus    no insulin  . Diarrhea    acute  . Diverticulitis of intestine   . FH: colonic polyps    adenomataous  . GERD (gastroesophageal reflux disease)   . History of kidney stones   . Hyperlipidemia    pulmonary embolism 2000 and 09/2008  . Hypertension   . Hypothyroidism   . Insomnia   . Irritable bowel syndrome   . Myocardial infarction ([place])   . Paroxysmal atrial fibrillation ([facility])    normal LV function; episodes occurred in 205 and 09/2007  . Pedal edema   . Peripheral edema   . PONV (postoperative nausea and vomiting)   . Pulmonary embolism ([place])    2000/09/2008  . Rectal bleeding   . Sleep apnea   . Thyroid disease    hypothyroidism  . Tobacco user    stopped   . Venous stasis     Past Surgical History:  Procedure Laterality Date  . ABDOMINAL HYSTERECTOMY    . APPENDECTOMY    . BALLOON DILATION N/A 11/11/2013   Procedure: BALLOON DILATION;  Surgeon: Rogene Houston, MD;  [facility]: [facility];  Service: Endoscopy;  Laterality: N/A;  . BREAST REDUCTION SURGERY    . BREAST SURGERY    . CHOLECYSTECTOMY    . CLEFT PALATE REPAIR     4 surgeries  . COLONOSCOPY  2011   negative  . COLONOSCOPY N/A 05/10/2014   Procedure: COLONOSCOPY;  Surgeon: Rogene Houston, MD;  [facility]: [facility];  Service: Endoscopy;  Laterality: N/A;  . ESOPHAGEAL  DILATION N/A 03/08/2016   Procedure: ESOPHAGEAL DILATION;  Surgeon: Rogene Houston, MD;  [facility]: [facility];  Service: Endoscopy;  Laterality: N/A;  . ESOPHAGOGASTRODUODENOSCOPY N/A 11/11/2013   Procedure: ESOPHAGOGASTRODUODENOSCOPY (EGD);  Surgeon: Rogene Houston, MD;  [facility]: [facility];  Service: Endoscopy;  Laterality: N/A;  200-moved to 100 Ann notified pt  . ESOPHAGOGASTRODUODENOSCOPY N/A 05/09/2014   Procedure: ESOPHAGOGASTRODUODENOSCOPY (EGD);  Surgeon: Rogene Houston, MD;  [facility]: [facility];  Service:  Endoscopy;  Laterality: N/A;  . ESOPHAGOGASTRODUODENOSCOPY N/A 03/08/2016   Procedure: ESOPHAGOGASTRODUODENOSCOPY (EGD);  Surgeon: Rogene Houston, MD;  [facility];  Service: Endoscopy;  Laterality: N/A;  2:20  . HEEL SPUR SURGERY    . KNEE SURGERY    . MALONEY DILATION N/A 11/11/2013   Procedure: Venia Minks DILATION;  Surgeon: Rogene Houston, MD;  [facility]: [facility];  Service: Endoscopy;  Laterality: N/A;  . OOPHORECTOMY    . SAVORY DILATION N/A 11/11/2013   Procedure: SAVORY DILATION;  Surgeon: Rogene Houston, MD;  [facility]: [facility];  Service: Endoscopy;  Laterality: N/A;  . TONSILLECTOMY      Prior to Admission medications   Medication Sig Start Date End Date Taking? Authorizing Provider  acetaminophen (TYLENOL) 500 MG tablet Take 500 mg by mouth daily as needed for headache.   Yes [provider]  albuterol (PROVENTIL) (2.5 MG/3ML) 0.083% nebulizer solution Take 3 mLs (2.5 mg total) by nebulization every 6 (six) hours as needed for wheezing or shortness of breath. 06/05/16  Yes Mikey Kirschner, MD  albuterol (VENTOLIN HFA) 108 (90 Base) MCG/ACT inhaler INHALE 2 PUFFS INTO THE LUNGS 4 TIMES DAILY AS NEEDED FOR WHEEZING. 03/04/18  Yes Mikey Kirschner, MD  atorvastatin (LIPITOR) 40 MG tablet TAKE ONE TABLET BY MOUTH DAILY. Patient taking differently: Take 40 mg by mouth every evening.  06/22/18  Yes Herminio Commons, MD  Cholecalciferol (VITAMIN D3) 1000 units CAPS Take 1 capsule every evening by mouth.    Yes [provider]  conjugated estrogens (PREMARIN) vaginal cream Place 1 Applicatorful vaginally 2 (two) times a week. On Mondays and Fridays   Yes [provider]  diltiazem (CARDIZEM CD) 180 MG 24 hr capsule TAKE ONE CAPSULE BY MOUTH DAILY. Patient taking differently: Take 180 mg by mouth daily.  08/15/16  Yes Herminio Commons, MD  diphenoxylate-atropine (LOMOTIL) 2.5-0.025 MG tablet TAKE ONE TABLET BY MOUTH 2 TIMES A DAY AS  NEEDED. Patient taking differently: Take 1 tablet by mouth every 12 (twelve) hours as needed for diarrhea or loose stools.  08/10/18  Yes Setzer, Rona Ravens, NP  doxycycline (VIBRA-TABS) 100 MG tablet Take 100 mg by mouth 2 (two) times daily. 5 day course starting on 05/10/2019   Yes [provider]  escitalopram (LEXAPRO) 20 MG tablet TAKE ONE TABLET BY MOUTH ONCE DAILY. Patient taking differently: Take 20 mg by mouth daily.  03/04/18  Yes Mikey Kirschner, MD  furosemide (LASIX) 40 MG tablet TAKE ONE TABLET BY MOUTH DAILY. Patient taking differently: Take 20 mg by mouth daily.  08/18/18  Yes Herminio Commons, MD  hydrocortisone cream 1 % Apply 1 application topically 2 (two) times daily.   Yes [provider]  levothyroxine (SYNTHROID, LEVOTHROID) 75 MCG tablet Take 1 tablet (75 mcg total) by mouth daily. 06/29/18  Yes Mikey Kirschner, MD  losartan (COZAAR) 25 MG tablet TAKE 1 TABLET BY MOUTH ONCE DAILY. Patient taking differently: Take 25 mg by  mouth daily.  02/03/18  Yes Herminio Commons, MD  meclizine (ANTIVERT) 25 MG tablet Take 1 tablet (25 mg total) by mouth 3 (three) times daily as needed for dizziness. Patient taking differently: Take 25 mg by mouth 2 (two) times daily.  04/14/18  Yes Mikey Kirschner, MD  nitroGLYCERIN (NITROLINGUAL) 0.4 MG/SPRAY spray Place 1 spray under the tongue every 5 (five) minutes x 3 doses as needed for chest pain. 06/04/16  Yes Herminio Commons, MD  pantoprazole (PROTONIX) 40 MG tablet Take 1 tablet (40 mg total) by mouth daily. 08/18/18  Yes Ghimire, Henreitta Leber, MD  Potassium Chloride ER 20 MEQ TBCR One po daily Patient taking differently: Take 20 mEq by mouth daily.  08/15/17  Yes Mikey Kirschner, MD  simethicone (MYLICON) 937 MG chewable tablet Chew 125 mg by mouth 3 (three) times daily with meals.   Yes [provider]  triamcinolone cream (KENALOG) 0.1 % Apply 1 application topically 2 (two) times daily. 08/18/18  Yes  Mikey Kirschner, MD  warfarin (COUMADIN) 5 MG tablet TAKE ONE TABLET BY MOUTH ONCE DAILY. TAKE AS DIRECTED BY DOCTOR. Patient taking differently: Take 5 mg by mouth every evening.  07/20/18  Yes Mikey Kirschner, MD  ferrous sulfate 325 (65 FE) MG tablet Take 1 tablet (325 mg total) by mouth daily with breakfast. IRON SUPPLEMENT. 08/12/11 09/13/11  Rexene Alberts, MD  ipratropium (ATROVENT) 0.02 % nebulizer solution Take 500 mcg by nebulization every 4 (four) hours as needed. For shortness of breath  09/13/11  [provider]    Current Facility-Administered Medications  Medication Dose Route Frequency Provider Last Rate Last Dose  . 0.9 %  sodium chloride infusion   Intravenous Continuous Zierle-Ghosh, Asia B, DO      . acetaminophen (TYLENOL) tablet 650 mg  650 mg Oral Q6H PRN Zierle-Ghosh, Asia B, DO   650 mg at 05/16/19 9024   Or  . acetaminophen (TYLENOL) suppository 650 mg  650 mg Rectal Q6H PRN Zierle-Ghosh, Asia B, DO      . albuterol (PROVENTIL) (2.5 MG/3ML) 0.083% nebulizer solution 2.5 mg  2.5 mg Nebulization Q6H PRN Zierle-Ghosh, Asia B, DO      . atorvastatin (LIPITOR) tablet 40 mg  40 mg Oral Daily Zierle-Ghosh, Asia B, DO   40 mg at 05/16/19 0917  . diltiazem (CARDIZEM CD) 24 hr capsule 180 mg  180 mg Oral Daily Zierle-Ghosh, Asia B, DO   180 mg at 05/16/19 0917  . escitalopram (LEXAPRO) tablet 20 mg  20 mg Oral Daily Zierle-Ghosh, Asia B, DO   20 mg at 05/16/19 0973  . furosemide (LASIX) tablet 40 mg  40 mg Oral Daily Zierle-Ghosh, Asia B, DO   40 mg at 05/16/19 5329  . heparin injection 5,000 Units  5,000 Units Subcutaneous Q8H Zierle-Ghosh, Asia B, DO      . levothyroxine (SYNTHROID) tablet 75 mcg  75 mcg Oral Daily Zierle-Ghosh, Asia B, DO      . losartan (COZAAR) tablet 25 mg  25 mg Oral Daily Zierle-Ghosh, Asia B, DO   25 mg at 05/16/19 0917  . ondansetron (ZOFRAN) tablet 4 mg  4 mg Oral Q6H PRN Zierle-Ghosh, Asia B, DO       Or  . ondansetron (ZOFRAN) injection 4  mg  4 mg Intravenous Q6H PRN Zierle-Ghosh, Asia B, DO   4 mg at 05/16/19 0937  . pantoprazole (PROTONIX) EC tablet 40 mg  40 mg Oral Daily Zierle-Ghosh, [place] B,  DO   40 mg at 05/16/19 8144    Allergies as of 05/15/2019 - Review Complete 05/15/2019  Allergen Reaction Noted  . Cefzil [cefprozil] Nausea And Vomiting 05/12/2016  . Demerol Nausea And Vomiting 03/04/2011    Family History  Problem Relation Age of Onset  . Heart attack Mother   . Diabetes Mother   . Hyperlipidemia Mother   . Heart attack Father   . Lung cancer Sister   . Lymphoma Brother   . Colon cancer Neg Hx    Social History   Socioeconomic History  . Marital status: Widowed    Spouse name: Not on file  . Number of children: Not on file  . Years of education: Not on file  . Highest education level: Not on file  Occupational History  . Occupation: retired    Fish farm manager: RETIRED  Social Needs  . Financial resource strain: Not on file  . Food insecurity    Worry: Not on file    Inability: Not on file  . Transportation needs    Medical: Not on file    Non-medical: Not on file  Tobacco Use  . Smoking status: Former Smoker    Packs/day: 2.00    Years: 30.00    Pack years: 60.00    Types: Cigarettes    Start date: 05/05/1987  . Smokeless tobacco: Never Used  Substance and Sexual Activity  . Alcohol use: No    Alcohol/week: 0.0 standard drinks  . Drug use: No  . Sexual activity: Never    Birth control/protection: Abstinence  Lifestyle  . Physical activity    Days per week: Not on file    Minutes per session: Not on file  . Stress: Not on file  Relationships  . Social Herbalist on phone: Not on file    Gets together: Not on file    Attends religious service: Not on file    Active member of club or organization: Not on file    Attends meetings of clubs or organizations: Not on file    Relationship status: Not on file  . Intimate partner violence    Fear of current or ex partner: Not on  file    Emotionally abused: Not on file    Physically abused: Not on file    Forced sexual activity: Not on file  Other Topics Concern  . Not on file  Social History Narrative  . Not on file   Review of Systems: PER HPI OTHERWISE ALL SYSTEMS ARE NEGATIVE.  Vitals: Blood pressure 131/78, pulse 62, temperature 98.2 F (36.8 C), temperature source Oral, resp. rate 18, height 5\' 1"  (1.549 m), weight 99.3 kg, SpO2 94 %.  Physical Exam: General:   Alert,  Well-developed, well-nourished, pleasant and cooperative in NAD Head:  Normocephalic and atraumatic. Eyes:  Sclera clear, no icterus.   Conjunctiva pink. Mouth:  No lesions, dentition abnormal. Neck:  Supple; no masses. Lungs:  Clear throughout to auscultation.   No wheezes. No acute distress. Heart:  Regular rate and rhythm; no murmurs. Abdomen:  Soft, MILDLY tender IN RUQ, nondistended. OBESE, No masses noted. Normal bowel sounds, without guarding, and without rebound.   Msk:  Symmetrical without gross deformities.  Extremities:  Without edema. Neurologic:  Alert and  oriented x4;  HARD OF HEARING, NO FOCAL DEFICITS Cervical Nodes:  No significant cervical adenopathy. Psych:  Alert and cooperative. Normal mood and affect.  Lab Results: Recent Labs    05/15/19 1443 05/16/19  0905  WBC 8.7 9.0  HGB 11.8* 13.2  HCT 39.7 43.7  PLT 279 275   BMET Recent Labs    05/15/19 1443 05/16/19 0905  NA 142 142  K 4.7 4.1  CL 103 104  CO2 30 28  GLUCOSE 92 79  BUN 17 16  CREATININE 0.83 0.68  CALCIUM 9.1 9.1   LFT Recent Labs    05/16/19 0905  PROT 7.0  ALBUMIN 3.4*  AST 17  ALT 12  ALKPHOS 11  BILITOT 0.9     Studies/Results: CT NECK/CHEST NOV 21: nl esophagus, dilated pulmonary artery   LOS: 0 days   Corde Antonini  05/16/2019, 11:10 AM

## 2019-05-16 NOTE — H&P (Signed)
TRH H&P    Patient Demographics:    [name], is a 78 y.o. female  MRN: [id number]  DOB - 22-Jan-1941  Admit Date - 05/15/2019  Referring MD/NP/PA: Dr. Roslynn Amble  Outpatient Primary MD for the patient is Wolfgang Phoenix, Grace Bushy, MD  Patient coming from: SNF  Chief complaint- Regurgitation   HPI:    [name]  is a 78 y.o. female, with history of thyroid disease, OSA, pulmonary embolism, paroxysmal atrial fibrillation, hypertension, hyperlipidemia, GERD, diabetes mellitus, depression and more presents to the ER today with a chief complaint of dysphagia.  Patient reports that she is regurgitating both solids and liquids.  She does report that it is worse with liquids than solids.  When she regurgitates liquids she reports that there is a lot of mucus present.  She reports that she chokes but does not cough during these episodes.  She feels like the food gets stuck in her throat immediately comes back up.  When this happened she has intense pain that cannot be described with characteristics in her esophagus and epigastric abdomen.  She reports this has not happened to her before.  Although she has had esophageal stretching back in 2017.  She reports when the pain was there to 12 out of 10.  Right now it is better.  It is worse with change in position, but if she lays real still she can prevent the pain.  She reports that when the pain is there it resolves spontaneously.  Patient presents from SNF.  She reports that they have likely tried medications that have not offered relief, but she does not know which ones as she is takes the medications they give her.  Patient has lived in [place] since last week in February.  Patient does have a history of IBS, and reports intermittent diarrhea and constipation.  She reports no feeling of nausea.  She has had no fevers, no chest pain.  Patient does have cough but reports that this is chronic  due to severe asthma.  Patient has history of peritonsillar abscess -she has had intermittent difficulty swallowing since this abscess.  She reports she has never had regurgitation like this.  In the ED ED provider spoke with GI who would like patient to be admitted, Coumadin to be held, and plan for EGD tomorrow.  Patient will be kept n.p.o. Chemistry panel was unremarkable.  Hematology revealed a hemoglobin of 11.8, stable as the last hemoglobin was 12.3 in February.  CT chest that showed 1 no acute cardiopulmonary process.  2 the main pulmonary artery is significantly dilated measuring 3.8 cm in diameter this can be seen with pulmonary arterial hypertension. (Patient does have history of OSA).  CT neck was also done that was 1 - CT of the neck.  No acute inflammatory changes or other abnormality identified.  2 aortic atherosclerosis associated approximate 60% stenosis at the origin of the left ICA.  3 moderate cervical spondylosis at C5-C6.    Review of systems:    In addition to the HPI above,  No  Fever-chills, No Headache, No changes with Vision or hearing, No Chest pain, Cough or Shortness of Breath, Positive for epigastric pain, No Nausea , bowel movements are normal for her No Blood in stool or Urine, No dysuria, No new skin rashes or bruises, No new joints pains-aches,  No new weakness, tingling, numbness in any extremity, No recent weight gain or loss, No polyuria, polydypsia or polyphagia, No significant Mental Stressors.  All other systems reviewed and are negative.    Past History of the following :    Past Medical History:  Diagnosis Date   Allergy    Allergy history unknown    Arthritis    ASCVD (arteriosclerotic cardiovascular disease)    stent to mid and proximal left anterior descending in 06/2002;drug eluting stent placed in the second diagnol in 08/2003 after  A  non-st elevation myocardial infarction    Asthma    Atrial fibrillation (HCC)    Chronic  anticoagulation    Chronic nausea    Chronic pain of left knee    Coronary artery disease    Current chronic use of systemic steroids 05/11/2016   Depression    Diabetes mellitus    no insulin   Diarrhea    acute   Diverticulitis of intestine    FH: colonic polyps    adenomataous   GERD (gastroesophageal reflux disease)    History of kidney stones    Hyperlipidemia    pulmonary embolism 2000 and 09/2008   Hypertension    Hypothyroidism    Insomnia    Irritable bowel syndrome    Myocardial infarction (HCC)    Paroxysmal atrial fibrillation (HCC)    normal LV function; episodes occurred in 205 and 09/2007   Pedal edema    Peripheral edema    PONV (postoperative nausea and vomiting)    Pulmonary embolism (HCC)    2000/09/2008   Rectal bleeding    Sleep apnea    Thyroid disease    hypothyroidism   Tobacco user    stopped    Venous stasis       Past Surgical History:  Procedure Laterality Date   ABDOMINAL HYSTERECTOMY     APPENDECTOMY     BALLOON DILATION N/A 11/11/2013   Procedure: BALLOON DILATION;  Surgeon: Rogene Houston, MD;  [facility];  Service: Endoscopy;  Laterality: N/A;   BREAST REDUCTION SURGERY     BREAST SURGERY     CHOLECYSTECTOMY     CLEFT PALATE REPAIR     4 surgeries   COLONOSCOPY  2011   negative   COLONOSCOPY N/A 05/10/2014   Procedure: COLONOSCOPY;  Surgeon: Rogene Houston, MD;  [facility];  Service: Endoscopy;  Laterality: N/A;   ESOPHAGEAL DILATION N/A 03/08/2016   Procedure: ESOPHAGEAL DILATION;  Surgeon: Rogene Houston, MD;  [facility];  Service: Endoscopy;  Laterality: N/A;   ESOPHAGOGASTRODUODENOSCOPY N/A 11/11/2013   Procedure: ESOPHAGOGASTRODUODENOSCOPY (EGD);  Surgeon: Rogene Houston, MD;  [facility];  Service: Endoscopy;  Laterality: N/A;  200-moved to 100 Ann notified pt   ESOPHAGOGASTRODUODENOSCOPY N/A 05/09/2014   Procedure:  ESOPHAGOGASTRODUODENOSCOPY (EGD);  Surgeon: Rogene Houston, MD;  [facility];  Service: Endoscopy;  Laterality: N/A;   ESOPHAGOGASTRODUODENOSCOPY N/A 03/08/2016   Procedure: ESOPHAGOGASTRODUODENOSCOPY (EGD);  Surgeon: Rogene Houston, MD;  [facility];  Service: Endoscopy;  Laterality: N/A;  2:20   HEEL SPUR SURGERY     KNEE SURGERY  MALONEY DILATION N/A 11/11/2013   Procedure: Venia Minks DILATION;  Surgeon: Rogene Houston, MD;  [facility];  Service: Endoscopy;  Laterality: N/A;   OOPHORECTOMY     SAVORY DILATION N/A 11/11/2013   Procedure: SAVORY DILATION;  Surgeon: Rogene Houston, MD;  [facility];  Service: Endoscopy;  Laterality: N/A;   TONSILLECTOMY        Social History:      Social History   Tobacco Use   Smoking status: Former Smoker    Packs/day: 2.00    Years: 30.00    Pack years: 60.00    Types: Cigarettes    Start date: 05/05/1987   Smokeless tobacco: Never Used  Substance Use Topics   Alcohol use: No    Alcohol/week: 0.0 standard drinks       Family History :     Family History  Problem Relation Age of Onset   Heart attack Mother    Diabetes Mother    Hyperlipidemia Mother    Heart attack Father    Lung cancer Sister    Lymphoma Brother    Colon cancer Neg Hx    Family history reviewed   Home Medications:   Prior to Admission medications   Medication Sig Start Date End Date Taking? Authorizing Provider  acetaminophen (TYLENOL) 500 MG tablet Take 500 mg by mouth daily as needed for headache.   Yes [provider]  albuterol (PROVENTIL) (2.5 MG/3ML) 0.083% nebulizer solution Take 3 mLs (2.5 mg total) by nebulization every 6 (six) hours as needed for wheezing or shortness of breath. 06/05/16  Yes Mikey Kirschner, MD  albuterol (VENTOLIN HFA) 108 (90 Base) MCG/ACT inhaler INHALE 2 PUFFS INTO THE LUNGS 4 TIMES DAILY AS NEEDED FOR WHEEZING. 03/04/18  Yes Mikey Kirschner, MD    atorvastatin (LIPITOR) 40 MG tablet TAKE ONE TABLET BY MOUTH DAILY. Patient taking differently: Take 40 mg by mouth every evening.  06/22/18  Yes Herminio Commons, MD  Cholecalciferol (VITAMIN D3) 1000 units CAPS Take 1 capsule every evening by mouth.    Yes [provider]  conjugated estrogens (PREMARIN) vaginal cream Place 1 Applicatorful vaginally 2 (two) times a week. On Mondays and Fridays   Yes [provider]  diltiazem (CARDIZEM CD) 180 MG 24 hr capsule TAKE ONE CAPSULE BY MOUTH DAILY. Patient taking differently: Take 180 mg by mouth daily.  08/15/16  Yes Herminio Commons, MD  diphenoxylate-atropine (LOMOTIL) 2.5-0.025 MG tablet TAKE ONE TABLET BY MOUTH 2 TIMES A DAY AS NEEDED. Patient taking differently: Take 1 tablet by mouth every 12 (twelve) hours as needed for diarrhea or loose stools.  08/10/18  Yes Setzer, Rona Ravens, NP  doxycycline (VIBRA-TABS) 100 MG tablet Take 100 mg by mouth 2 (two) times daily. 5 day course starting on 05/10/2019   Yes [provider]  escitalopram (LEXAPRO) 20 MG tablet TAKE ONE TABLET BY MOUTH ONCE DAILY. Patient taking differently: Take 20 mg by mouth daily.  03/04/18  Yes Mikey Kirschner, MD  furosemide (LASIX) 40 MG tablet TAKE ONE TABLET BY MOUTH DAILY. Patient taking differently: Take 20 mg by mouth daily.  08/18/18  Yes Herminio Commons, MD  hydrocortisone cream 1 % Apply 1 application topically 2 (two) times daily.   Yes [provider]  levothyroxine (SYNTHROID, LEVOTHROID) 75 MCG tablet Take 1 tablet (75 mcg total) by mouth daily. 06/29/18  Yes Mikey Kirschner, MD  losartan (COZAAR) 25 MG tablet TAKE 1  TABLET BY MOUTH ONCE DAILY. Patient taking differently: Take 25 mg by mouth daily.  02/03/18  Yes Herminio Commons, MD  meclizine (ANTIVERT) 25 MG tablet Take 1 tablet (25 mg total) by mouth 3 (three) times daily as needed for dizziness. Patient taking differently: Take 25 mg by mouth 2 (two) times  daily.  04/14/18  Yes Mikey Kirschner, MD  nitroGLYCERIN (NITROLINGUAL) 0.4 MG/SPRAY spray Place 1 spray under the tongue every 5 (five) minutes x 3 doses as needed for chest pain. 06/04/16  Yes Herminio Commons, MD  pantoprazole (PROTONIX) 40 MG tablet Take 1 tablet (40 mg total) by mouth daily. 08/18/18  Yes Ghimire, Henreitta Leber, MD  Potassium Chloride ER 20 MEQ TBCR One po daily Patient taking differently: Take 20 mEq by mouth daily.  08/15/17  Yes Mikey Kirschner, MD  simethicone (MYLICON) 941 MG chewable tablet Chew 125 mg by mouth 3 (three) times daily with meals.   Yes [provider]  triamcinolone cream (KENALOG) 0.1 % Apply 1 application topically 2 (two) times daily. 08/18/18  Yes Mikey Kirschner, MD  warfarin (COUMADIN) 5 MG tablet TAKE ONE TABLET BY MOUTH ONCE DAILY. TAKE AS DIRECTED BY DOCTOR. Patient taking differently: Take 5 mg by mouth every evening.  07/20/18  Yes Mikey Kirschner, MD  ferrous sulfate 325 (65 FE) MG tablet Take 1 tablet (325 mg total) by mouth daily with breakfast. IRON SUPPLEMENT. 08/12/11 09/13/11  Rexene Alberts, MD  ipratropium (ATROVENT) 0.02 % nebulizer solution Take 500 mcg by nebulization every 4 (four) hours as needed. For shortness of breath  09/13/11  [provider]     Allergies:     Allergies  Allergen Reactions   Cefzil [Cefprozil] Nausea And Vomiting   Demerol Nausea And Vomiting     Physical Exam:   Vitals  Blood pressure 123/72, pulse 70, temperature 99.2 F (37.3 C), temperature source Oral, resp. rate 12, height 5\' 1"  (1.549 m), weight 99.3 kg, SpO2 95 %.  1.  General: Lying supine in bed in no acute distress  2. Psychiatric:  Affect normal for situation  3. Neurologic: Cranial nerves II through XII are grossly intact  4. HEENMT:  Head is atraumatic normocephalic pupils are reactive to light trachea is midline no masses palpated  5. Respiratory : Lungs are clear to auscultation bilaterally  6.  Cardiovascular : H RRR  7. Gastrointestinal:  Abdomen is obese, soft, nondistended, nontender  8. Skin:  No acute lesions on limited skin exam     Data Review:    CBC Recent Labs  Lab 05/15/19 1443  WBC 8.7  HGB 11.8*  HCT 39.7  PLT 279  MCV 94.3  MCH 28.0  MCHC 29.7*  RDW 14.5  LYMPHSABS 1.8  MONOABS 0.6  EOSABS 0.2  BASOSABS 0.0   ------------------------------------------------------------------------------------------------------------------  Results for orders placed or performed during the hospital encounter of 05/15/19 (from the past 48 hour(s))  CBC with Differential     Status: Abnormal   Collection Time: 05/15/19  2:43 PM  Result Value Ref Range   WBC 8.7 4.0 - 10.5 K/uL   RBC 4.21 3.87 - 5.11 MIL/uL   Hemoglobin 11.8 (L) 12.0 - 15.0 g/dL   HCT 39.7 36.0 - 46.0 %   MCV 94.3 80.0 - 100.0 fL   MCH 28.0 26.0 - 34.0 pg   MCHC 29.7 (L) 30.0 - 36.0 g/dL   RDW 14.5 11.5 - 15.5 %   Platelets 279 150 -  400 K/uL   nRBC 0.0 0.0 - 0.2 %   Neutrophils Relative % 71 %   Neutro Abs 6.1 1.7 - 7.7 K/uL   Lymphocytes Relative 20 %   Lymphs Abs 1.8 0.7 - 4.0 K/uL   Monocytes Relative 7 %   Monocytes Absolute 0.6 0.1 - 1.0 K/uL   Eosinophils Relative 2 %   Eosinophils Absolute 0.2 0.0 - 0.5 K/uL   Basophils Relative 0 %   Basophils Absolute 0.0 0.0 - 0.1 K/uL   Immature Granulocytes 0 %   Abs Immature Granulocytes 0.02 0.00 - 0.07 K/uL    Comment: Performed at [facility], [address], [place], [place] [address]  Basic metabolic panel     Status: None   Collection Time: 05/15/19  2:43 PM  Result Value Ref Range   Sodium 142 135 - 145 mmol/L   Potassium 4.7 3.5 - 5.1 mmol/L   Chloride 103 98 - 111 mmol/L   CO2 30 22 - 32 mmol/L   Glucose, Bld 92 70 - 99 mg/dL   BUN 17 8 - 23 mg/dL   Creatinine, Ser 0.83 0.44 - 1.00 mg/dL   Calcium 9.1 8.9 - 10.3 mg/dL   GFR calc non Af Amer >60 >60 mL/min   GFR calc Af Amer >60 >60 mL/min   Anion gap 9 5 - 15     Comment: Performed at [facility], [address], [place], [place] [address]  PT/INR     Status: Abnormal   Collection Time: 05/15/19  2:45 PM  Result Value Ref Range   Prothrombin Time 24.1 (H) 11.4 - 15.2 seconds   INR 2.2 (H) 0.8 - 1.2    Comment: (NOTE) INR goal varies based on device and disease states. Performed at [facility], [address], [place], [place] [address]     Chemistries  Recent Labs  Lab 05/15/19 1443  NA 142  K 4.7  CL 103  CO2 30  GLUCOSE 92  BUN 17  CREATININE 0.83  CALCIUM 9.1   ------------------------------------------------------------------------------------------------------------------  ------------------------------------------------------------------------------------------------------------------ GFR: Estimated Creatinine Clearance: 58.4 mL/min (by C-G formula based on SCr of 0.83 mg/dL). Liver Function Tests: No results for input(s): AST, ALT, ALKPHOS, BILITOT, PROT, ALBUMIN in the last 168 hours. No results for input(s): LIPASE, AMYLASE in the last 168 hours. No results for input(s): AMMONIA in the last 168 hours. Coagulation Profile: Recent Labs  Lab 05/15/19 1445  INR 2.2*   Cardiac Enzymes: No results for input(s): CKTOTAL, CKMB, CKMBINDEX, TROPONINI in the last 168 hours. BNP (last 3 results) No results for input(s): PROBNP in the last 8760 hours. HbA1C: No results for input(s): HGBA1C in the last 72 hours. CBG: No results for input(s): GLUCAP in the last 168 hours. Lipid Profile: No results for input(s): CHOL, HDL, LDLCALC, TRIG, CHOLHDL, LDLDIRECT in the last 72 hours. Thyroid Function Tests: No results for input(s): TSH, T4TOTAL, FREET4, T3FREE, THYROIDAB in the last 72 hours. Anemia Panel: No results for input(s): VITAMINB12, FOLATE, FERRITIN, TIBC, IRON, RETICCTPCT in the last 72 hours.  --------------------------------------------------------------------------------------------------------------- Urine analysis:     Component Value Date/Time   COLORURINE YELLOW 04/24/2018 1020   APPEARANCEUR HAZY (A) 04/24/2018 1020   LABSPEC 1.029 04/24/2018 1020   PHURINE 6.0 04/24/2018 1020   GLUCOSEU NEGATIVE 04/24/2018 1020   HGBUR SMALL (A) 04/24/2018 1020   BILIRUBINUR NEGATIVE 04/24/2018 1020   KETONESUR NEGATIVE 04/24/2018 1020   PROTEINUR NEGATIVE 04/24/2018 1020   UROBILINOGEN 0.2 05/08/2014 2240   NITRITE NEGATIVE  04/24/2018 1020   LEUKOCYTESUR LARGE (A) 04/24/2018 1020      Imaging Results:    Dg Chest 2 View  Result Date: 05/15/2019 CLINICAL DATA:  Chest pain EXAM: CHEST - 2 VIEW COMPARISON:  04/25/2018 FINDINGS: The heart size and mediastinal contours are enlarged, stable. Calcific aortic knob. No focal airspace consolidation, pleural effusion, or pneumothorax. IMPRESSION: No active cardiopulmonary disease. Electronically Signed   By: Davina Poke M.D.   On: 05/15/2019 15:09   Ct Soft Tissue Neck W Contrast  Result Date: 05/15/2019 CLINICAL DATA:  Initial evaluation for acute pain with swallowing since yesterday. EXAM: CT NECK WITH CONTRAST TECHNIQUE: Multidetector CT imaging of the neck was performed using the standard protocol following the bolus administration of intravenous contrast. CONTRAST:  54mL OMNIPAQUE IOHEXOL 300 MG/ML  SOLN COMPARISON:  None available. FINDINGS: Pharynx and larynx: Oral cavity within normal limits without discrete mass or collection. No acute inflammatory changes seen about the dentition. Palatine tonsils symmetric and within normal limits. Parapharyngeal fat maintained. Remainder of the oropharynx and visualized nasopharynx within normal limits. No retropharyngeal collection. Epiglottis within normal limits. Vallecula clear. Remainder of the hypopharynx and supraglottic larynx within normal limits without appreciable swelling or inflammatory changes. True cords are opposed and not well assessed, although grossly symmetric and within normal limits. Subglottic airway  clear. Salivary glands: Salivary glands including the parotid and submandibular glands are within normal limits. No sialolithiasis or acute sialoadenitis. Thyroid: Thyroid within normal limits. Lymph nodes: No pathologically enlarged or concerning adenopathy seen within the neck. Vascular: Moderate atherosclerotic change present about the aortic arch and carotid bifurcations. Associated stenosis of up to approximately 60% noted at the origin of the left ICA. Right carotid artery system medialized into the retropharyngeal space. Limited intracranial: Unremarkable. Visualized orbits: Globes and orbital soft tissues not visualized on this exam. Mastoids and visualized paranasal sinuses: Visualized maxillary sinuses are clear. Mastoid air cells and middle ear cavities not included on this exam. Skeleton: No acute osseous abnormality. Sclerotic lesion within the C6 vertebral body most characteristic of a benign bone island. No other discrete or worrisome osseous lesions. Moderate cervical spondylosis noted at C5-6. Moderate Upper chest: Visualized upper chest demonstrates no acute finding. Other: None. IMPRESSION: 1. Negative CT of the neck. No acute inflammatory changes or other abnormality identified. 2. Aortic Atherosclerosis (ICD10-I70.0). 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 3. Moderate cervical spondylosis at C5-6. Electronically Signed   By: Jeannine Boga M.D.   On: 05/15/2019 21:34   Ct Chest Wo Contrast  Result Date: 05/15/2019 CLINICAL DATA:  Unexplained dysphagia. EXAM: CT CHEST WITHOUT CONTRAST TECHNIQUE: Multidetector CT imaging of the chest was performed following the standard protocol without IV contrast. COMPARISON:  None. FINDINGS: Cardiovascular: The heart size is relatively normal. Aortic calcifications are noted. The main pulmonary artery is significantly dilated measuring 3.8 cm in diameter. Mediastinum/Nodes: --No mediastinal or hilar lymphadenopathy. --No  axillary lymphadenopathy. --No supraclavicular lymphadenopathy. --Normal thyroid gland. --The esophagus is unremarkable Lungs/Pleura: No pulmonary nodules or masses. No pleural effusion or pneumothorax. No focal airspace consolidation. No focal pleural abnormality. Upper Abdomen: The patient is status post cholecystectomy. Musculoskeletal: There are multilevel compression fractures throughout the thoracic spine, all of which appear to be chronic. There is a calcified left breast mass, presumably benign. Review of the MIP images confirms the above findings. IMPRESSION: 1. No acute cardiopulmonary process. 2. The main pulmonary artery is significantly dilated measuring 3.8 cm in diameter. This can be seen with  pulmonary arterial hypertension. Aortic Atherosclerosis (ICD10-I70.0). Electronically Signed   By: Constance Holster M.D.   On: 05/15/2019 21:10       Assessment & Plan:    Active Problems:   Dysphagia   1. Dysphagia 1. Continue Protonix 2. Consult GI for possible EGD tomorrow 3. Hold blood thinner for possible EGD tomorrow 4. N.p.o. except for meds and sips for possible EGD tomorrow 5. Continue to monitor 6. May need a speech therapy consult for swallow eval in the future 2. GERD 1. Continue PPI 3. Chronic anemia 1. Stable continue to monitor with daily CBC 4. Depression 1. Continue Lexapro 5. Hypertension 1. Continue Cardizem, losartan 6. Hypothyroid 1. Continue Synthroid and recheck TSH, adjust Synthroid accordingly 7. Paroxysmal atrial fibrillation 1. Hold warfarin for possible procedure tomorrow   DVT Prophylaxis-   Heparin- SCDs   AM Labs Ordered, also please review Full Orders  Family Communication: No family at bedside Code Status: DNR  Admission status: Observation: Based on patients clinical presentation and evaluation of above clinical data, I have made determination that patient meets observation criteria at this time.  Time spent in minutes : 65   Rolla Plate M.D on 05/16/2019 at 2:37 AM

## 2019-05-16 NOTE — Consult Note (Addendum)
Referring Provider: No ref. provider found Primary Care Physician:  Mikey Kirschner, MD Primary Gastroenterologist:  DR. Laural Golden  Reason for Consultation:  DYSPHAGIA/ODYNOPHAGIA   Impression: ADMITTED WITH ODYNOPHAGIA/DYSPHAGIA. DIFFERENTIAL DIAGNOSIS INCLUDES: CANDIDA ESOPHAGITIS, HSV ESOPHAGITIS, EROSIVE ESOPHAGITIS/SCHATZKI'S RING, PRIMARY ESOPHAGEAL MOTILITY DISORDER,OR 2o ESOPHAGEAL MOTILITY DISORDER DUE TO UNCONTROLLED REFLUX, LESS LIKELY ESOPHAGEAL CANCER.  Plan: 1. NPO EXCEPT SIPS WITH MEDS. 2. EGD TODAY. NEEDS PHENERGAN 6.25 MG IV IN PREOP.  DISCUSSED PROCEDURE, BENEFITS, & RISKS: < 1% chance of medication reaction, bleeding, perforation, or ASPIRATION. CALLED AND LVM FOR NIECE(MRS. ROBERTS). 3. BID PPI. 4. HOLD COUMADIN.    HPI:  PT HAS HISTORY OF DYSPHAGIA. 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 ONE WEK AGO STARTED HAVIGN PAIN WITH SWALLOWING AND PAIN WITH SWALLOWING. NO STEROIDS OR THRUSH. CHEWS FOOD REALLY WELL AND DRINKS LIQUIDS AND FOOD COMES RIGHT BACK EVEN WITH WATER. ABLE TO SWALLOW HER OWN SPIT. A LITTLE NAUSEA. BMs: USUALLY ONCE A DAY. CHEST HURTS RIGHT DOWN THE MIDDLE. GETTING WORSE. C/O PAIN IN RIGHT SIDE(SQUEEZING) AND MOVE FROM BELLY BUTTON TO RIGHT SIDE.  BEEN GAINING WEIGHT. APPETITE: REALLY GOOD.  PT DENIES FEVER, CHILLS, HEMATOCHEZIA, HEMATEMESIS, nausea, vomiting, melena, diarrhea, SHORTNESS OF BREATH,  CHANGE IN BOWEL IN HABITS, constipation, problems with sedation, OR heartburn or indigestion.  Past Medical History:  Diagnosis Date  . Allergy   . Allergy history unknown   . Arthritis   . ASCVD (arteriosclerotic cardiovascular disease)    stent to mid and proximal left anterior descending in 06/2002;drug eluting stent placed in the second diagnol in 08/2003 after  A  non-st elevation myocardial infarction   . Asthma   . Atrial fibrillation ([place])   . Chronic anticoagulation   . Chronic nausea   . Chronic pain of left knee   . Coronary artery  disease   . Current chronic use of systemic steroids 05/11/2016  . Depression   . Diabetes mellitus    no insulin  . Diarrhea    acute  . Diverticulitis of intestine   . FH: colonic polyps    adenomataous  . GERD (gastroesophageal reflux disease)   . History of kidney stones   . Hyperlipidemia    pulmonary embolism 2000 and 09/2008  . Hypertension   . Hypothyroidism   . Insomnia   . Irritable bowel syndrome   . Myocardial infarction ([place])   . Paroxysmal atrial fibrillation ([facility])    normal LV function; episodes occurred in 205 and 09/2007  . Pedal edema   . Peripheral edema   . PONV (postoperative nausea and vomiting)   . Pulmonary embolism ([place])    2000/09/2008  . Rectal bleeding   . Sleep apnea   . Thyroid disease    hypothyroidism  . Tobacco user    stopped   . Venous stasis     Past Surgical History:  Procedure Laterality Date  . ABDOMINAL HYSTERECTOMY    . APPENDECTOMY    . BALLOON DILATION N/A 11/11/2013   Procedure: BALLOON DILATION;  Surgeon: Rogene Houston, MD;  [facility];  Service: Endoscopy;  Laterality: N/A;  . BREAST REDUCTION SURGERY    . BREAST SURGERY    . CHOLECYSTECTOMY    . CLEFT PALATE REPAIR     4 surgeries  . COLONOSCOPY  2011   negative  . COLONOSCOPY N/A 05/10/2014   Procedure: COLONOSCOPY;  Surgeon: Rogene Houston, MD;  [facility];  Service: Endoscopy;  Laterality: N/A;  . ESOPHAGEAL  DILATION N/A 03/08/2016   Procedure: ESOPHAGEAL DILATION;  Surgeon: Rogene Houston, MD;  [facility];  Service: Endoscopy;  Laterality: N/A;  . ESOPHAGOGASTRODUODENOSCOPY N/A 11/11/2013   Procedure: ESOPHAGOGASTRODUODENOSCOPY (EGD);  Surgeon: Rogene Houston, MD;  [facility];  Service: Endoscopy;  Laterality: N/A;  200-moved to 100 Ann notified pt  . ESOPHAGOGASTRODUODENOSCOPY N/A 05/09/2014   Procedure: ESOPHAGOGASTRODUODENOSCOPY (EGD);  Surgeon: Rogene Houston, MD;  [facility];  Service:  Endoscopy;  Laterality: N/A;  . ESOPHAGOGASTRODUODENOSCOPY N/A 03/08/2016   Procedure: ESOPHAGOGASTRODUODENOSCOPY (EGD);  Surgeon: Rogene Houston, MD;  [facility];  Service: Endoscopy;  Laterality: N/A;  2:20  . HEEL SPUR SURGERY    . KNEE SURGERY    . MALONEY DILATION N/A 11/11/2013   Procedure: Venia Minks DILATION;  Surgeon: Rogene Houston, MD;  [facility];  Service: Endoscopy;  Laterality: N/A;  . OOPHORECTOMY    . SAVORY DILATION N/A 11/11/2013   Procedure: SAVORY DILATION;  Surgeon: Rogene Houston, MD;  [facility];  Service: Endoscopy;  Laterality: N/A;  . TONSILLECTOMY      Prior to Admission medications   Medication Sig Start Date End Date Taking? Authorizing Provider  acetaminophen (TYLENOL) 500 MG tablet Take 500 mg by mouth daily as needed for headache.   Yes [provider]  albuterol (PROVENTIL) (2.5 MG/3ML) 0.083% nebulizer solution Take 3 mLs (2.5 mg total) by nebulization every 6 (six) hours as needed for wheezing or shortness of breath. 06/05/16  Yes Mikey Kirschner, MD  albuterol (VENTOLIN HFA) 108 (90 Base) MCG/ACT inhaler INHALE 2 PUFFS INTO THE LUNGS 4 TIMES DAILY AS NEEDED FOR WHEEZING. 03/04/18  Yes Mikey Kirschner, MD  atorvastatin (LIPITOR) 40 MG tablet TAKE ONE TABLET BY MOUTH DAILY. Patient taking differently: Take 40 mg by mouth every evening.  06/22/18  Yes Herminio Commons, MD  Cholecalciferol (VITAMIN D3) 1000 units CAPS Take 1 capsule every evening by mouth.    Yes [provider]  conjugated estrogens (PREMARIN) vaginal cream Place 1 Applicatorful vaginally 2 (two) times a week. On Mondays and Fridays   Yes [provider]  diltiazem (CARDIZEM CD) 180 MG 24 hr capsule TAKE ONE CAPSULE BY MOUTH DAILY. Patient taking differently: Take 180 mg by mouth daily.  08/15/16  Yes Herminio Commons, MD  diphenoxylate-atropine (LOMOTIL) 2.5-0.025 MG tablet TAKE ONE TABLET BY MOUTH 2 TIMES A DAY AS  NEEDED. Patient taking differently: Take 1 tablet by mouth every 12 (twelve) hours as needed for diarrhea or loose stools.  08/10/18  Yes Setzer, Rona Ravens, NP  doxycycline (VIBRA-TABS) 100 MG tablet Take 100 mg by mouth 2 (two) times daily. 5 day course starting on 05/10/2019   Yes [provider]  escitalopram (LEXAPRO) 20 MG tablet TAKE ONE TABLET BY MOUTH ONCE DAILY. Patient taking differently: Take 20 mg by mouth daily.  03/04/18  Yes Mikey Kirschner, MD  furosemide (LASIX) 40 MG tablet TAKE ONE TABLET BY MOUTH DAILY. Patient taking differently: Take 20 mg by mouth daily.  08/18/18  Yes Herminio Commons, MD  hydrocortisone cream 1 % Apply 1 application topically 2 (two) times daily.   Yes [provider]  levothyroxine (SYNTHROID, LEVOTHROID) 75 MCG tablet Take 1 tablet (75 mcg total) by mouth daily. 06/29/18  Yes Mikey Kirschner, MD  losartan (COZAAR) 25 MG tablet TAKE 1 TABLET BY MOUTH ONCE DAILY. Patient taking differently: Take 25 mg by  mouth daily.  02/03/18  Yes Herminio Commons, MD  meclizine (ANTIVERT) 25 MG tablet Take 1 tablet (25 mg total) by mouth 3 (three) times daily as needed for dizziness. Patient taking differently: Take 25 mg by mouth 2 (two) times daily.  04/14/18  Yes Mikey Kirschner, MD  nitroGLYCERIN (NITROLINGUAL) 0.4 MG/SPRAY spray Place 1 spray under the tongue every 5 (five) minutes x 3 doses as needed for chest pain. 06/04/16  Yes Herminio Commons, MD  pantoprazole (PROTONIX) 40 MG tablet Take 1 tablet (40 mg total) by mouth daily. 08/18/18  Yes Ghimire, Henreitta Leber, MD  Potassium Chloride ER 20 MEQ TBCR One po daily Patient taking differently: Take 20 mEq by mouth daily.  08/15/17  Yes Mikey Kirschner, MD  simethicone (MYLICON) 962 MG chewable tablet Chew 125 mg by mouth 3 (three) times daily with meals.   Yes [provider]  triamcinolone cream (KENALOG) 0.1 % Apply 1 application topically 2 (two) times daily. 08/18/18  Yes  Mikey Kirschner, MD  warfarin (COUMADIN) 5 MG tablet TAKE ONE TABLET BY MOUTH ONCE DAILY. TAKE AS DIRECTED BY DOCTOR. Patient taking differently: Take 5 mg by mouth every evening.  07/20/18  Yes Mikey Kirschner, MD  ferrous sulfate 325 (65 FE) MG tablet Take 1 tablet (325 mg total) by mouth daily with breakfast. IRON SUPPLEMENT. 08/12/11 09/13/11  Rexene Alberts, MD  ipratropium (ATROVENT) 0.02 % nebulizer solution Take 500 mcg by nebulization every 4 (four) hours as needed. For shortness of breath  09/13/11  [provider]    Current Facility-Administered Medications  Medication Dose Route Frequency Provider Last Rate Last Dose  . 0.9 %  sodium chloride infusion   Intravenous Continuous Zierle-Ghosh, Asia B, DO      . acetaminophen (TYLENOL) tablet 650 mg  650 mg Oral Q6H PRN Zierle-Ghosh, Asia B, DO   650 mg at 05/16/19 8366   Or  . acetaminophen (TYLENOL) suppository 650 mg  650 mg Rectal Q6H PRN Zierle-Ghosh, Asia B, DO      . albuterol (PROVENTIL) (2.5 MG/3ML) 0.083% nebulizer solution 2.5 mg  2.5 mg Nebulization Q6H PRN Zierle-Ghosh, Asia B, DO      . atorvastatin (LIPITOR) tablet 40 mg  40 mg Oral Daily Zierle-Ghosh, Asia B, DO   40 mg at 05/16/19 0917  . diltiazem (CARDIZEM CD) 24 hr capsule 180 mg  180 mg Oral Daily Zierle-Ghosh, Asia B, DO   180 mg at 05/16/19 0917  . escitalopram (LEXAPRO) tablet 20 mg  20 mg Oral Daily Zierle-Ghosh, Asia B, DO   20 mg at 05/16/19 2947  . furosemide (LASIX) tablet 40 mg  40 mg Oral Daily Zierle-Ghosh, Asia B, DO   40 mg at 05/16/19 6546  . heparin injection 5,000 Units  5,000 Units Subcutaneous Q8H Zierle-Ghosh, Asia B, DO      . levothyroxine (SYNTHROID) tablet 75 mcg  75 mcg Oral Daily Zierle-Ghosh, Asia B, DO      . losartan (COZAAR) tablet 25 mg  25 mg Oral Daily Zierle-Ghosh, Asia B, DO   25 mg at 05/16/19 0917  . ondansetron (ZOFRAN) tablet 4 mg  4 mg Oral Q6H PRN Zierle-Ghosh, Asia B, DO       Or  . ondansetron (ZOFRAN) injection 4  mg  4 mg Intravenous Q6H PRN Zierle-Ghosh, Asia B, DO   4 mg at 05/16/19 0937  . pantoprazole (PROTONIX) EC tablet 40 mg  40 mg Oral Daily Zierle-Ghosh, [place] B,  DO   40 mg at 05/16/19 7017    Allergies as of 05/15/2019 - Review Complete 05/15/2019  Allergen Reaction Noted  . Cefzil [cefprozil] Nausea And Vomiting 05/12/2016  . Demerol Nausea And Vomiting 03/04/2011    Family History  Problem Relation Age of Onset  . Heart attack Mother   . Diabetes Mother   . Hyperlipidemia Mother   . Heart attack Father   . Lung cancer Sister   . Lymphoma Brother   . Colon cancer Neg Hx    Social History   Socioeconomic History  . Marital status: Widowed    Spouse name: Not on file  . Number of children: Not on file  . Years of education: Not on file  . Highest education level: Not on file  Occupational History  . Occupation: retired    Fish farm manager: RETIRED  Social Needs  . Financial resource strain: Not on file  . Food insecurity    Worry: Not on file    Inability: Not on file  . Transportation needs    Medical: Not on file    Non-medical: Not on file  Tobacco Use  . Smoking status: Former Smoker    Packs/day: 2.00    Years: 30.00    Pack years: 60.00    Types: Cigarettes    Start date: 05/05/1987  . Smokeless tobacco: Never Used  Substance and Sexual Activity  . Alcohol use: No    Alcohol/week: 0.0 standard drinks  . Drug use: No  . Sexual activity: Never    Birth control/protection: Abstinence  Lifestyle  . Physical activity    Days per week: Not on file    Minutes per session: Not on file  . Stress: Not on file  Relationships  . Social Herbalist on phone: Not on file    Gets together: Not on file    Attends religious service: Not on file    Active member of club or organization: Not on file    Attends meetings of clubs or organizations: Not on file    Relationship status: Not on file  . Intimate partner violence    Fear of current or ex partner: Not on  file    Emotionally abused: Not on file    Physically abused: Not on file    Forced sexual activity: Not on file  Other Topics Concern  . Not on file  Social History Narrative  . Not on file   Review of Systems: PER HPI OTHERWISE ALL SYSTEMS ARE NEGATIVE.  Vitals: Blood pressure 131/78, pulse 62, temperature 98.2 F (36.8 C), temperature source Oral, resp. rate 18, height 5\' 1"  (1.549 m), weight 99.3 kg, SpO2 94 %.  Physical Exam: General:   Alert,  Well-developed, well-nourished, pleasant and cooperative in NAD Head:  Normocephalic and atraumatic. Eyes:  Sclera clear, no icterus.   Conjunctiva pink. Mouth:  No lesions, dentition abnormal. Neck:  Supple; no masses. Lungs:  Clear throughout to auscultation.   No wheezes. No acute distress. Heart:  Regular rate and IRREGULAR rhythm; no murmurs. Abdomen:  Soft, MILDLY tender IN RUQ, nondistended. OBESE, No masses noted. Normal bowel sounds, without guarding, and without rebound.   Msk:  Symmetrical without gross deformities.  Extremities:  Without edema. Neurologic:  Alert and  oriented x4;  HARD OF HEARING, NO FOCAL DEFICITS Cervical Nodes:  No significant cervical adenopathy. Psych:  Alert and cooperative. Normal mood and affect.  Lab Results: Recent Labs    05/15/19 1443  05/16/19 0905  WBC 8.7 9.0  HGB 11.8* 13.2  HCT 39.7 43.7  PLT 279 275   BMET Recent Labs    05/15/19 1443 05/16/19 0905  NA 142 142  K 4.7 4.1  CL 103 104  CO2 30 28  GLUCOSE 92 79  BUN 17 16  CREATININE 0.83 0.68  CALCIUM 9.1 9.1   LFT Recent Labs    05/16/19 0905  PROT 7.0  ALBUMIN 3.4*  AST 17  ALT 12  ALKPHOS 85  BILITOT 0.9     Studies/Results: CT NECK/CHEST NOV 21: nl esophagus, dilated pulmonary artery   LOS: 0 days   Jaran Sainz  05/16/2019, 11:10 AM

## 2019-05-17 DIAGNOSIS — E1122 Type 2 diabetes mellitus with diabetic chronic kidney disease: Secondary | ICD-10-CM | POA: Diagnosis not present

## 2019-05-17 DIAGNOSIS — D649 Anemia, unspecified: Secondary | ICD-10-CM | POA: Diagnosis present

## 2019-05-17 DIAGNOSIS — E039 Hypothyroidism, unspecified: Secondary | ICD-10-CM | POA: Diagnosis present

## 2019-05-17 DIAGNOSIS — I252 Old myocardial infarction: Secondary | ICD-10-CM | POA: Diagnosis not present

## 2019-05-17 DIAGNOSIS — M6281 Muscle weakness (generalized): Secondary | ICD-10-CM | POA: Diagnosis not present

## 2019-05-17 DIAGNOSIS — I129 Hypertensive chronic kidney disease with stage 1 through stage 4 chronic kidney disease, or unspecified chronic kidney disease: Secondary | ICD-10-CM | POA: Diagnosis not present

## 2019-05-17 DIAGNOSIS — Z66 Do not resuscitate: Secondary | ICD-10-CM | POA: Diagnosis not present

## 2019-05-17 DIAGNOSIS — J45909 Unspecified asthma, uncomplicated: Secondary | ICD-10-CM | POA: Diagnosis present

## 2019-05-17 DIAGNOSIS — M47812 Spondylosis without myelopathy or radiculopathy, cervical region: Secondary | ICD-10-CM | POA: Diagnosis present

## 2019-05-17 DIAGNOSIS — R0789 Other chest pain: Secondary | ICD-10-CM | POA: Diagnosis not present

## 2019-05-17 DIAGNOSIS — I2510 Atherosclerotic heart disease of native coronary artery without angina pectoris: Secondary | ICD-10-CM | POA: Diagnosis present

## 2019-05-17 DIAGNOSIS — Z20828 Contact with and (suspected) exposure to other viral communicable diseases: Secondary | ICD-10-CM | POA: Diagnosis not present

## 2019-05-17 DIAGNOSIS — I700 Atherosclerosis of aorta: Secondary | ICD-10-CM | POA: Diagnosis present

## 2019-05-17 DIAGNOSIS — K222 Esophageal obstruction: Secondary | ICD-10-CM | POA: Diagnosis not present

## 2019-05-17 DIAGNOSIS — Z8616 Personal history of COVID-19: Secondary | ICD-10-CM | POA: Diagnosis not present

## 2019-05-17 DIAGNOSIS — K2990 Gastroduodenitis, unspecified, without bleeding: Secondary | ICD-10-CM | POA: Diagnosis not present

## 2019-05-17 DIAGNOSIS — Z6841 Body Mass Index (BMI) 40.0 and over, adult: Secondary | ICD-10-CM | POA: Diagnosis not present

## 2019-05-17 DIAGNOSIS — K209 Esophagitis, unspecified without bleeding: Secondary | ICD-10-CM | POA: Diagnosis not present

## 2019-05-17 DIAGNOSIS — R2681 Unsteadiness on feet: Secondary | ICD-10-CM | POA: Diagnosis not present

## 2019-05-17 DIAGNOSIS — E785 Hyperlipidemia, unspecified: Secondary | ICD-10-CM | POA: Diagnosis not present

## 2019-05-17 DIAGNOSIS — R471 Dysarthria and anarthria: Secondary | ICD-10-CM | POA: Diagnosis present

## 2019-05-17 DIAGNOSIS — M4854XD Collapsed vertebra, not elsewhere classified, thoracic region, subsequent encounter for fracture with routine healing: Secondary | ICD-10-CM | POA: Diagnosis present

## 2019-05-17 DIAGNOSIS — I10 Essential (primary) hypertension: Secondary | ICD-10-CM | POA: Diagnosis not present

## 2019-05-17 DIAGNOSIS — K219 Gastro-esophageal reflux disease without esophagitis: Secondary | ICD-10-CM | POA: Diagnosis not present

## 2019-05-17 DIAGNOSIS — N182 Chronic kidney disease, stage 2 (mild): Secondary | ICD-10-CM | POA: Diagnosis not present

## 2019-05-17 DIAGNOSIS — K582 Mixed irritable bowel syndrome: Secondary | ICD-10-CM | POA: Diagnosis present

## 2019-05-17 DIAGNOSIS — R41841 Cognitive communication deficit: Secondary | ICD-10-CM | POA: Diagnosis not present

## 2019-05-17 DIAGNOSIS — R1310 Dysphagia, unspecified: Secondary | ICD-10-CM | POA: Diagnosis not present

## 2019-05-17 DIAGNOSIS — Z743 Need for continuous supervision: Secondary | ICD-10-CM | POA: Diagnosis not present

## 2019-05-17 DIAGNOSIS — R262 Difficulty in walking, not elsewhere classified: Secondary | ICD-10-CM | POA: Diagnosis not present

## 2019-05-17 DIAGNOSIS — R0902 Hypoxemia: Secondary | ICD-10-CM | POA: Diagnosis not present

## 2019-05-17 DIAGNOSIS — E0822 Diabetes mellitus due to underlying condition with diabetic chronic kidney disease: Secondary | ICD-10-CM | POA: Diagnosis not present

## 2019-05-17 DIAGNOSIS — U071 COVID-19: Secondary | ICD-10-CM | POA: Diagnosis not present

## 2019-05-17 DIAGNOSIS — Z7901 Long term (current) use of anticoagulants: Secondary | ICD-10-CM | POA: Diagnosis not present

## 2019-05-17 DIAGNOSIS — E1142 Type 2 diabetes mellitus with diabetic polyneuropathy: Secondary | ICD-10-CM | POA: Diagnosis not present

## 2019-05-17 DIAGNOSIS — K2100 Gastro-esophageal reflux disease with esophagitis, without bleeding: Secondary | ICD-10-CM | POA: Diagnosis not present

## 2019-05-17 DIAGNOSIS — M1990 Unspecified osteoarthritis, unspecified site: Secondary | ICD-10-CM | POA: Diagnosis present

## 2019-05-17 DIAGNOSIS — I480 Paroxysmal atrial fibrillation: Secondary | ICD-10-CM | POA: Diagnosis not present

## 2019-05-17 DIAGNOSIS — K2970 Gastritis, unspecified, without bleeding: Secondary | ICD-10-CM | POA: Diagnosis not present

## 2019-05-17 DIAGNOSIS — G4733 Obstructive sleep apnea (adult) (pediatric): Secondary | ICD-10-CM | POA: Diagnosis present

## 2019-05-17 DIAGNOSIS — R1312 Dysphagia, oropharyngeal phase: Secondary | ICD-10-CM | POA: Diagnosis not present

## 2019-05-17 LAB — GLUCOSE, CAPILLARY
Glucose-Capillary: 85 mg/dL (ref 70–99)
Glucose-Capillary: 117 mg/dL — ABNORMAL HIGH (ref 70–99)
Glucose-Capillary: 76 mg/dL (ref 70–99)
Glucose-Capillary: 87 mg/dL (ref 70–99)
Glucose-Capillary: 175 mg/dL — ABNORMAL HIGH (ref 70–99)

## 2019-05-17 MED ORDER — OXYCODONE-ACETAMINOPHEN 7.5-325 MG PO TABS
1.0000 | ORAL_TABLET | Freq: Three times a day (TID) | ORAL | Status: DC
  Administered 2019-05-17 – 2019-05-21 (×11): 1 via ORAL
  Filled 2019-05-17 (×11): qty 1

## 2019-05-17 MED ORDER — WARFARIN SODIUM 5 MG PO TABS
5.0000 mg | ORAL_TABLET | Freq: Once | ORAL | Status: AC
  Administered 2019-05-17: 5 mg via ORAL
  Filled 2019-05-17: qty 1

## 2019-05-17 MED ORDER — LIDOCAINE VISCOUS HCL 2 % MT SOLN
10.0000 mL | Freq: Three times a day (TID) | OROMUCOSAL | Status: DC
  Administered 2019-05-17 – 2019-05-21 (×13): 10 mL via OROMUCOSAL
  Filled 2019-05-17 (×15): qty 15

## 2019-05-17 MED ORDER — LIDOCAINE VISCOUS HCL 2 % MT SOLN: 15.0000 mL | Freq: Four times a day (QID) | OROMUCOSAL | Status: DC | PRN

## 2019-05-17 MED ORDER — WARFARIN - PHARMACIST DOSING INPATIENT
Freq: Every day | Status: DC
  Administered 2019-05-18 – 2019-05-19 (×2)

## 2019-05-17 NOTE — NC FL2 (Deleted)
[place] LEVEL OF CARE SCREENING TOOL     IDENTIFICATION  Patient Name: [name] Birthdate: 10-11-1940 Sex: female Admission Date (Current Location): 05/15/2019  [facility] and [place] Number:  [employer] and Address:  [facility] [address] [place]      Provider Number: [id number]  Attending Physician Name and Address:  Orson Eva, MD  Relative Name and Phone Number:       Current Level of Care: Hospital Recommended Level of Care: [place] Prior Approval Number:    Date Approved/Denied:   PASRR Number:    Discharge Plan: SNF    Current Diagnoses: Patient Active Problem List   Diagnosis Date Noted  . Tonsillar abscess 08/13/2018  . AF (paroxysmal atrial fibrillation) ([place]) 08/13/2018  . Nausea vomiting and diarrhea 05/11/2016  . Obesity 05/11/2016  . 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 Insomnia 04/15/2016  . Hyperkalemia 04/08/2016  . Volume depletion 04/08/2016  . AKI (acute kidney injury) ([place]) 04/08/2016  . Depression 04/07/2016  . Nausea without vomiting 05/09/2014  . Melena 05/08/2014  . UGI bleed 05/08/2014  . Dysphagia, unspecified(787.20) 10/26/2013  . Difficulty walking 10/19/2012  . Weakness of left leg 10/19/2012  . Long term current use of anticoagulant therapy 09/26/2012  . Dysphagia 05/04/2012  . Microcytic anemia 08/10/2011  . Acute respiratory failure ([place]) 08/09/2011  . IBS (irritable bowel syndrome) 08/09/2011  . DOE (dyspnea on exertion) 03/05/2011  . Anemia 03/05/2011  . Peripheral neuropathy 10/02/2010  . 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 DIARRHEA, ACUTE 12/05/2008  . Hypothyroidism 12/02/2008  . Controlled type 2 diabetes mellitus ([place]) 12/02/2008  . Hyperlipidemia LDL goal <70 12/02/2008  . Essential hypertension 12/02/2008  . Coronary atherosclerosis 12/02/2008  . Chronic atrial fibrillation ([place])  12/02/2008  . Asthma 12/02/2008  . GERD 12/02/2008  . TOBACCO USE, QUIT 12/02/2008    Orientation RESPIRATION BLADDER Height & Weight     Self, Time, Situation, Place  O2(2L) Incontinent Weight: 218 lb 4.1 oz (99 kg) Height:  5\' 1"  (154.9 cm)  BEHAVIORAL SYMPTOMS/MOOD NEUROLOGICAL BOWEL NUTRITION STATUS      Incontinent    AMBULATORY STATUS COMMUNICATION OF NEEDS Skin   Limited Assist   Normal                       Personal Care Assistance Level of Assistance  Bathing, Dressing, Feeding Bathing Assistance: Limited assistance Feeding assistance: Independent Dressing Assistance: Limited assistance     Functional Limitations Info  Sight, Hearing, Speech Sight Info: Adequate Hearing Info: Adequate Speech Info: Adequate    SPECIAL CARE FACTORS FREQUENCY                       Contractures Contractures Info: Not present    Additional Factors Info  Code Status, Allergies Code Status Info: DNR Allergies Info: Cefzil, Demerol           Current Medications (05/17/2019):  This is the current hospital active medication list Current Facility-Administered Medications  Medication Dose Route Frequency Provider Last Rate Last Dose  . 0.9 %  sodium chloride infusion   Intravenous Continuous Fields, Sandi L, MD 100 mL/hr at 05/17/19 1427    . 0.9 %  sodium chloride infusion   Intravenous Continuous Fields, Sandi L, MD      . acetaminophen (TYLENOL) tablet 650 mg  650 mg Oral Q6H  PRN Danie Binder, MD   650 mg at 05/16/19 2044   Or  . acetaminophen (TYLENOL) suppository 650 mg  650 mg Rectal Q6H PRN Fields, Sandi L, MD      . albuterol (PROVENTIL) (2.5 MG/3ML) 0.083% nebulizer solution 2.5 mg  2.5 mg Nebulization Q6H PRN Fields, Sandi L, MD      . atorvastatin (LIPITOR) tablet 40 mg  40 mg Oral Daily Fields, Sandi L, MD   40 mg at 05/17/19 1004  . diltiazem (CARDIZEM CD) 24 hr capsule 180 mg  180 mg Oral Daily Fields, Sandi L, MD   180 mg at 05/17/19 1004  .  escitalopram (LEXAPRO) tablet 20 mg  20 mg Oral Daily Fields, Sandi L, MD   20 mg at 05/17/19 1002  . furosemide (LASIX) tablet 40 mg  40 mg Oral Daily Fields, Sandi L, MD   40 mg at 05/17/19 1003  . heparin injection 5,000 Units  5,000 Units Subcutaneous Q8H Danie Binder, MD   5,000 Units at 05/17/19 1403  . levothyroxine (SYNTHROID) tablet 75 mcg  75 mcg Oral Daily Danie Binder, MD   75 mcg at 05/17/19 0557  . lidocaine (XYLOCAINE) 2 % viscous mouth solution 10 mL  10 mL Mouth/Throat TID WC & HS Fields, Sandi L, MD   10 mL at 05/17/19 1636  . lidocaine (XYLOCAINE) 2 % viscous mouth solution 15 mL  15 mL Mouth/Throat Q6H PRN Annitta Needs, NP      . losartan (COZAAR) tablet 25 mg  25 mg Oral Daily Fields, Sandi L, MD   25 mg at 05/17/19 1003  . ondansetron (ZOFRAN) tablet 4 mg  4 mg Oral Q6H PRN Fields, Sandi L, MD       Or  . ondansetron (ZOFRAN) injection 4 mg  4 mg Intravenous Q6H PRN Danie Binder, MD   4 mg at 05/16/19 0937  . oxyCODONE-acetaminophen (PERCOCET) 7.5-325 MG per tablet 1 tablet  1 tablet Oral TID AC Danie Binder, MD   1 tablet at 05/17/19 1634  . pantoprazole (PROTONIX) injection 40 mg  40 mg Intravenous BID AC Fields, Sandi L, MD   40 mg at 05/17/19 1634  . Warfarin - Pharmacist Dosing Inpatient   Does not apply q1800 Tat, Shanon Brow, MD         Discharge Medications: Please see discharge summary for a list of discharge medications.  Relevant Imaging Results:  Relevant Lab Results:   Additional Information SSN [id number] Clydene Pugh, LCSW

## 2019-05-17 NOTE — Progress Notes (Signed)
Subjective: No abdominal pain. Still notes odynophagia. Denies eating grits but it appears multiple bites were taken from tray. States she regurgitated a pill, but nursing staff is not aware of this.   Objective: Vital signs in last 24 hours: Temp:  [97.6 F (36.4 C)-98.7 F (37.1 C)] 97.7 F (36.5 C) (11/23 0800) Pulse Rate:  [48-86] 53 (11/23 0800) Resp:  [6-20] 16 (11/23 0312) BP: (90-160)/(46-78) 136/55 (11/23 0800) SpO2:  [90 %-100 %] 94 % (11/23 0800) Weight:  [99 kg] 99 kg (11/23 0312)   General:   Alert and oriented Abdomen:  Bowel sounds present, obese, soft, non-tender, non-distended.  Neurologic:  Alert and  oriented x4 Psych: Flat affect  Intake/Output from previous day: 11/22 0701 - 11/23 0700 In: 1921.1 [P.O.:835; I.V.:1086.1] Out: 400 [Urine:400] Intake/Output this shift: No intake/output data recorded.  Lab Results: Recent Labs    05/15/19 1443 05/16/19 0905  WBC 8.7 9.0  HGB 11.8* 13.2  HCT 39.7 43.7  PLT 279 275   BMET Recent Labs    05/15/19 1443 05/16/19 0905  NA 142 142  K 4.7 4.1  CL 103 104  CO2 30 28  GLUCOSE 92 79  BUN 17 16  CREATININE 0.83 0.68  CALCIUM 9.1 9.1   LFT Recent Labs    05/16/19 0905  PROT 7.0  ALBUMIN 3.4*  AST 17  ALT 12  ALKPHOS 69  BILITOT 0.9   PT/INR Recent Labs    05/15/19 1445 05/16/19 0905  LABPROT 24.1* 19.5*  INR 2.2* 1.7*    Studies/Results: Dg Chest 2 View  Result Date: 05/15/2019 CLINICAL DATA:  Chest pain EXAM: CHEST - 2 VIEW COMPARISON:  04/25/2018 FINDINGS: The heart size and mediastinal contours are enlarged, stable. Calcific aortic knob. No focal airspace consolidation, pleural effusion, or pneumothorax. IMPRESSION: No active cardiopulmonary disease. Electronically Signed   By: Davina Poke M.D.   On: 05/15/2019 15:09   Ct Soft Tissue Neck W Contrast  Result Date: 05/15/2019 CLINICAL DATA:  Initial evaluation for acute pain with swallowing since yesterday. EXAM: CT  NECK WITH CONTRAST TECHNIQUE: Multidetector CT imaging of the neck was performed using the standard protocol following the bolus administration of intravenous contrast. CONTRAST:  109mL OMNIPAQUE IOHEXOL 300 MG/ML  SOLN COMPARISON:  None available. FINDINGS: Pharynx and larynx: Oral cavity within normal limits without discrete mass or collection. No acute inflammatory changes seen about the dentition. Palatine tonsils symmetric and within normal limits. Parapharyngeal fat maintained. Remainder of the oropharynx and visualized nasopharynx within normal limits. No retropharyngeal collection. Epiglottis within normal limits. Vallecula clear. Remainder of the hypopharynx and supraglottic larynx within normal limits without appreciable swelling or inflammatory changes. True cords are opposed and not well assessed, although grossly symmetric and within normal limits. Subglottic airway clear. Salivary glands: Salivary glands including the parotid and submandibular glands are within normal limits. No sialolithiasis or acute sialoadenitis. Thyroid: Thyroid within normal limits. Lymph nodes: No pathologically enlarged or concerning adenopathy seen within the neck. Vascular: Moderate atherosclerotic change present about the aortic arch and carotid bifurcations. Associated stenosis of up to approximately 60% noted at the origin of the left ICA. Right carotid artery system medialized into the retropharyngeal space. Limited intracranial: Unremarkable. Visualized orbits: Globes and orbital soft tissues not visualized on this exam. Mastoids and visualized paranasal sinuses: Visualized maxillary sinuses are clear. Mastoid air cells and middle ear cavities not included on this exam. Skeleton: No acute osseous abnormality. Sclerotic lesion within the C6  vertebral body most characteristic of a benign bone island. No other discrete or worrisome osseous lesions. Moderate cervical spondylosis noted at C5-6. Moderate Upper chest: Visualized  upper chest demonstrates no acute finding. Other: None. IMPRESSION: 1. Negative CT of the neck. No acute inflammatory changes or other abnormality identified. 2. Aortic Atherosclerosis (ICD10-I70.0). 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 3. Moderate cervical spondylosis at C5-6. Electronically Signed   By: Jeannine Boga M.D.   On: 05/15/2019 21:34   Ct Chest Wo Contrast  Result Date: 05/15/2019 CLINICAL DATA:  Unexplained dysphagia. EXAM: CT CHEST WITHOUT CONTRAST TECHNIQUE: Multidetector CT imaging of the chest was performed following the standard protocol without IV contrast. COMPARISON:  None. FINDINGS: Cardiovascular: The heart size is relatively normal. Aortic calcifications are noted. The main pulmonary artery is significantly dilated measuring 3.8 cm in diameter. Mediastinum/Nodes: --No mediastinal or hilar lymphadenopathy. --No axillary lymphadenopathy. --No supraclavicular lymphadenopathy. --Normal thyroid gland. --The esophagus is unremarkable Lungs/Pleura: No pulmonary nodules or masses. No pleural effusion or pneumothorax. No focal airspace consolidation. No focal pleural abnormality. Upper Abdomen: The patient is status post cholecystectomy. Musculoskeletal: There are multilevel compression fractures throughout the thoracic spine, all of which appear to be chronic. There is a calcified left breast mass, presumably benign. Review of the MIP images confirms the above findings. IMPRESSION: 1. No acute cardiopulmonary process. 2. The main pulmonary artery is significantly dilated measuring 3.8 cm in diameter. This can be seen with pulmonary arterial hypertension. Aortic Atherosclerosis (ICD10-I70.0). Electronically Signed   By: Constance Holster M.D.   On: 05/15/2019 21:10    Assessment: 78 year old female admitted with odynophagia/dysphagia with EGD yesterday noting benign-appearing intrinsic moderate stenosis s/p dilation. Felt odynophagia due to esophagitis  and dysphagia due to benign-appearing esophageal stricture. She is reporting odynophagia today and notes she regurgitated her pill; however, nursing staff not aware. Appears she ate a small amount of grits on tray but denies this to me.   May resume Coumadin today. Will need to continue on BID PPI therapy for a month, then transition to once daily. BPE on file Nov 2019 with moderate diffuse impairment of esophageal motility and poor clearance of barium by primary peristaltic waves. Would benefit from supportive measures including sitting upright while eating, soft diet, and not laying down for 2-3 hours after eating.   Plan: Continue PPI BID therapy Restart viscous lidocaine  Sit upright while eating Continue full liquids and advance once tolerating to soft diet EGD in 1 month with Dr. Laural Golden for retreatment May resume Coumadin today Consider speech eval if difficulty advancing diet   Annitta Needs, PhD, ANP-BC [facility]     LOS: 0 days    05/17/2019, 10:57 AM

## 2019-05-17 NOTE — Progress Notes (Signed)
ANTICOAGULATION CONSULT NOTE - Initial Consult  Pharmacy Consult for warfarin Indication: atrial fibrillation  Allergies  Allergen Reactions  . Cefzil [Cefprozil] Nausea And Vomiting  . Demerol Nausea And Vomiting    Patient Measurements: Height: 5\' 1"  (154.9 cm) Weight: 218 lb 4.1 oz (99 kg) IBW/kg (Calculated) : 47.8   Vital Signs: Temp: 97.7 F (36.5 C) (11/23 0800) Temp Source: Oral (11/23 0800) BP: 136/55 (11/23 0800) Pulse Rate: 53 (11/23 0800)  Labs: Recent Labs    05/15/19 1443 05/15/19 1445 05/16/19 0905  HGB 11.8*  --  13.2  HCT 39.7  --  43.7  PLT 279  --  275  LABPROT  --  24.1* 19.5*  INR  --  2.2* 1.7*  CREATININE 0.83  --  0.68    Estimated Creatinine Clearance: 60.5 mL/min (by C-G formula based on SCr of 0.68 mg/dL).   Medical History: Past Medical History:  Diagnosis Date  . Allergy   . Allergy history unknown   . Arthritis   . ASCVD (arteriosclerotic cardiovascular disease)    stent to mid and proximal left anterior descending in 06/2002;drug eluting stent placed in the second diagnol in 08/2003 after  A  non-st elevation myocardial infarction   . Asthma   . Atrial fibrillation ([place])   . Chronic anticoagulation   . Chronic nausea   . Chronic pain of left knee   . Coronary artery disease   . Current chronic use of systemic steroids 05/11/2016  . Depression   . Diabetes mellitus    no insulin  . Diarrhea    acute  . Diverticulitis of intestine   . FH: colonic polyps    adenomataous  . GERD (gastroesophageal reflux disease)   . History of kidney stones   . Hyperlipidemia    pulmonary embolism 2000 and 09/2008  . Hypertension   . Hypothyroidism   . Insomnia   . Irritable bowel syndrome   . Myocardial infarction ([place])   . Paroxysmal atrial fibrillation ([facility])    normal LV function; episodes occurred in 205 and 09/2007  . Pedal edema   . Peripheral edema   . PONV (postoperative nausea and vomiting)   . Pulmonary embolism ([place])    2000/09/2008  . Rectal bleeding   . Sleep apnea   . Thyroid disease    hypothyroidism  . Tobacco user    stopped   . Venous stasis     Medications:  Medications Prior to Admission  Medication Sig Dispense Refill Last Dose  . acetaminophen (TYLENOL) 500 MG tablet Take 500 mg by mouth daily as needed for headache.   05/15/2019 at Unknown time  . albuterol (PROVENTIL) (2.5 MG/3ML) 0.083% nebulizer solution Take 3 mLs (2.5 mg total) by nebulization every 6 (six) hours as needed for wheezing or shortness of breath. 75 mL 5 Past Month at Unknown time  . albuterol (VENTOLIN HFA) 108 (90 Base) MCG/ACT inhaler INHALE 2 PUFFS INTO THE LUNGS 4 TIMES DAILY AS NEEDED FOR WHEEZING. 18 g 5 Past Month at Unknown time  . atorvastatin (LIPITOR) 40 MG tablet TAKE ONE TABLET BY MOUTH DAILY. (Patient taking differently: Take 40 mg by mouth every evening. ) 30 tablet 3 05/14/2019 at Unknown time  . Cholecalciferol (VITAMIN D3) 1000 units CAPS Take 1 capsule every evening by mouth.    05/14/2019 at Unknown time  . conjugated estrogens (PREMARIN) vaginal cream Place 1 Applicatorful vaginally 2 (two) times a week. On Mondays and Fridays   05/14/2019 at Unknown  time  . diltiazem (CARDIZEM CD) 180 MG 24 hr capsule TAKE ONE CAPSULE BY MOUTH DAILY. (Patient taking differently: Take 180 mg by mouth daily. ) 30 capsule 11 05/15/2019 at Unknown time  . diphenoxylate-atropine (LOMOTIL) 2.5-0.025 MG tablet TAKE ONE TABLET BY MOUTH 2 TIMES A DAY AS NEEDED. (Patient taking differently: Take 1 tablet by mouth every 12 (twelve) hours as needed for diarrhea or loose stools. ) 60 tablet 0 Past Month at Unknown time  . doxycycline (VIBRA-TABS) 100 MG tablet Take 100 mg by mouth 2 (two) times daily. 5 day course starting on 05/10/2019   05/15/2019 at Unknown time  . escitalopram (LEXAPRO) 20 MG tablet TAKE ONE TABLET BY MOUTH ONCE DAILY. (Patient taking differently: Take 20 mg by mouth daily. ) 30 tablet 0 05/15/2019 at Unknown time  .  furosemide (LASIX) 40 MG tablet TAKE ONE TABLET BY MOUTH DAILY. (Patient taking differently: Take 20 mg by mouth daily. ) 30 tablet 0 05/14/2019 at Unknown time  . hydrocortisone cream 1 % Apply 1 application topically 2 (two) times daily.   05/15/2019 at Unknown time  . levothyroxine (SYNTHROID, LEVOTHROID) 75 MCG tablet Take 1 tablet (75 mcg total) by mouth daily. 90 tablet 1 05/15/2019 at Unknown time  . losartan (COZAAR) 25 MG tablet TAKE 1 TABLET BY MOUTH ONCE DAILY. (Patient taking differently: Take 25 mg by mouth daily. ) 90 tablet 3 05/15/2019 at Unknown time  . meclizine (ANTIVERT) 25 MG tablet Take 1 tablet (25 mg total) by mouth 3 (three) times daily as needed for dizziness. (Patient taking differently: Take 25 mg by mouth 2 (two) times daily. ) 30 tablet 0 05/15/2019 at Unknown time  . nitroGLYCERIN (NITROLINGUAL) 0.4 MG/SPRAY spray Place 1 spray under the tongue every 5 (five) minutes x 3 doses as needed for chest pain. 12 g 3 unknown  . pantoprazole (PROTONIX) 40 MG tablet Take 1 tablet (40 mg total) by mouth daily. 60 tablet 3 05/15/2019 at Unknown time  . Potassium Chloride ER 20 MEQ TBCR One po daily (Patient taking differently: Take 20 mEq by mouth daily. ) 90 tablet 1 05/15/2019 at Unknown time  . simethicone (MYLICON) 366 MG chewable tablet Chew 125 mg by mouth 3 (three) times daily with meals.   05/15/2019 at Unknown time  . triamcinolone cream (KENALOG) 0.1 % Apply 1 application topically 2 (two) times daily. 60 g 6 05/15/2019 at Unknown time  . warfarin (COUMADIN) 5 MG tablet TAKE ONE TABLET BY MOUTH ONCE DAILY. TAKE AS DIRECTED BY DOCTOR. (Patient taking differently: Take 5 mg by mouth every evening. ) 30 tablet 5 05/14/2019 at 1700    Assessment: Pharmacy consulted to dose warfarin in patient with atrial fibrillation.  Coumadin was held for upper GI endoscopy and ok to restart per GI. Last INR 1.7.  Home dose listed as 5 mg daily.  Vitamin K 2.5 mg PO given on 11/21  Goal  of Therapy:  INR 2-3 Monitor platelets by anticoagulation protocol: Yes   Plan:  Warfarin 5 mg x 1 dose. Monitor labs and s/s of bleeding.  Margot Ables, PharmD Clinical Pharmacist 05/17/2019 3:06 PM

## 2019-05-17 NOTE — NC FL2 (Deleted)
[place] LEVEL OF CARE SCREENING TOOL     IDENTIFICATION  Patient Name: [name] Birthdate: 23-Dec-1940 Sex: female Admission Date (Current Location): 05/15/2019  [facility] and [place] Number:  [employer] and Address:  [place] [address] [place]      Provider Number: [id number]  Attending Physician Name and Address:  Orson Eva, MD  Relative Name and Phone Number:       Current Level of Care: Hospital Recommended Level of Care: [place] Prior Approval Number:    Date Approved/Denied:   PASRR Number:    Discharge Plan: SNF    Current Diagnoses: Patient Active Problem List   Diagnosis Date Noted  . Tonsillar abscess 08/13/2018  . AF (paroxysmal atrial fibrillation) ([place]) 08/13/2018  . Nausea vomiting and diarrhea 05/11/2016  . Obesity 05/11/2016  . 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 Insomnia 04/15/2016  . Hyperkalemia 04/08/2016  . Volume depletion 04/08/2016  . AKI (acute kidney injury) ([place]) 04/08/2016  . Depression 04/07/2016  . Nausea without vomiting 05/09/2014  . Melena 05/08/2014  . UGI bleed 05/08/2014  . Dysphagia, unspecified(787.20) 10/26/2013  . Difficulty walking 10/19/2012  . Weakness of left leg 10/19/2012  . Long term current use of anticoagulant therapy 09/26/2012  . Dysphagia 05/04/2012  . Microcytic anemia 08/10/2011  . Acute respiratory failure ([place]) 08/09/2011  . IBS (irritable bowel syndrome) 08/09/2011  . DOE (dyspnea on exertion) 03/05/2011  . Anemia 03/05/2011  . Peripheral neuropathy 10/02/2010  . 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 DIARRHEA, ACUTE 12/05/2008  . Hypothyroidism 12/02/2008  . Controlled type 2 diabetes mellitus ([place]) 12/02/2008  . Hyperlipidemia LDL goal <70 12/02/2008  . Essential hypertension 12/02/2008  . Coronary atherosclerosis 12/02/2008  . Chronic atrial fibrillation ([place])  12/02/2008  . Asthma 12/02/2008  . GERD 12/02/2008  . TOBACCO USE, QUIT 12/02/2008    Orientation RESPIRATION BLADDER Height & Weight     Self, Time, Situation, Place  O2(2L) Incontinent Weight: 218 lb 4.1 oz (99 kg) Height:  5\' 1"  (154.9 cm)  BEHAVIORAL SYMPTOMS/MOOD NEUROLOGICAL BOWEL NUTRITION STATUS      Incontinent Diet(DYS 1)  AMBULATORY STATUS COMMUNICATION OF NEEDS Skin   Limited Assist   Normal                       Personal Care Assistance Level of Assistance  Bathing, Dressing, Feeding Bathing Assistance: Limited assistance Feeding assistance: Independent Dressing Assistance: Limited assistance     Functional Limitations Info  Sight, Hearing, Speech Sight Info: Adequate Hearing Info: Adequate Speech Info: Adequate    SPECIAL CARE FACTORS FREQUENCY                       Contractures Contractures Info: Not present    Additional Factors Info  Code Status, Allergies Code Status Info: DNR Allergies Info: Cefzil, Demerol           Current Medications (05/17/2019):  This is the current hospital active medication list Current Facility-Administered Medications  Medication Dose Route Frequency Provider Last Rate Last Dose  . 0.9 %  sodium chloride infusion   Intravenous Continuous Fields, Sandi L, MD 100 mL/hr at 05/17/19 1427    . 0.9 %  sodium chloride infusion   Intravenous Continuous Fields, Sandi L, MD      . acetaminophen (TYLENOL) tablet 650 mg  650 mg Oral Q6H  PRN Danie Binder, MD   650 mg at 05/16/19 2044   Or  . acetaminophen (TYLENOL) suppository 650 mg  650 mg Rectal Q6H PRN Fields, Sandi L, MD      . albuterol (PROVENTIL) (2.5 MG/3ML) 0.083% nebulizer solution 2.5 mg  2.5 mg Nebulization Q6H PRN Fields, Sandi L, MD      . atorvastatin (LIPITOR) tablet 40 mg  40 mg Oral Daily Fields, Sandi L, MD   40 mg at 05/17/19 1004  . diltiazem (CARDIZEM CD) 24 hr capsule 180 mg  180 mg Oral Daily Fields, Sandi L, MD   180 mg at 05/17/19 1004  .  escitalopram (LEXAPRO) tablet 20 mg  20 mg Oral Daily Fields, Sandi L, MD   20 mg at 05/17/19 1002  . furosemide (LASIX) tablet 40 mg  40 mg Oral Daily Fields, Sandi L, MD   40 mg at 05/17/19 1003  . heparin injection 5,000 Units  5,000 Units Subcutaneous Q8H Danie Binder, MD   5,000 Units at 05/17/19 1403  . levothyroxine (SYNTHROID) tablet 75 mcg  75 mcg Oral Daily Danie Binder, MD   75 mcg at 05/17/19 0557  . lidocaine (XYLOCAINE) 2 % viscous mouth solution 10 mL  10 mL Mouth/Throat TID WC & HS Fields, Sandi L, MD   10 mL at 05/17/19 1636  . lidocaine (XYLOCAINE) 2 % viscous mouth solution 15 mL  15 mL Mouth/Throat Q6H PRN Annitta Needs, NP      . losartan (COZAAR) tablet 25 mg  25 mg Oral Daily Fields, Sandi L, MD   25 mg at 05/17/19 1003  . ondansetron (ZOFRAN) tablet 4 mg  4 mg Oral Q6H PRN Fields, Sandi L, MD       Or  . ondansetron (ZOFRAN) injection 4 mg  4 mg Intravenous Q6H PRN Danie Binder, MD   4 mg at 05/16/19 0937  . oxyCODONE-acetaminophen (PERCOCET) 7.5-325 MG per tablet 1 tablet  1 tablet Oral TID AC Danie Binder, MD   1 tablet at 05/17/19 1634  . pantoprazole (PROTONIX) injection 40 mg  40 mg Intravenous BID AC Fields, Sandi L, MD   40 mg at 05/17/19 1634  . Warfarin - Pharmacist Dosing Inpatient   Does not apply q1800 Tat, Shanon Brow, MD         Discharge Medications: Please see discharge summary for a list of discharge medications.  Relevant Imaging Results:  Relevant Lab Results:   Additional Information SSN [id number] Clydene Pugh, LCSW

## 2019-05-17 NOTE — Clinical Social Work Note (Signed)
Patient is a LTC resident at [facility], admitting last year. Patient can ambulate but typically uses a wheelchair and prefers to stay in bed per Irven Shelling at [facility].  Patient is limited assistance for ADLs and feeds herself.     Yaneisy Wenz, Clydene Pugh, LCSW

## 2019-05-17 NOTE — Evaluation (Signed)
Clinical/Bedside Swallow Evaluation Patient Details  Name: [name] MRN: [id number] Date of Birth: August 01, 1940  Today's Date: 05/17/2019 Time: SLP Start Time (ACUTE ONLY): 1623 SLP Stop Time (ACUTE ONLY): 1646 SLP Time Calculation (min) (ACUTE ONLY): 23 min  Past Medical History:  Past Medical History:  Diagnosis Date  . Allergy   . Allergy history unknown   . Arthritis   . ASCVD (arteriosclerotic cardiovascular disease)    stent to mid and proximal left anterior descending in 06/2002;drug eluting stent placed in the second diagnol in 08/2003 after  A  non-st elevation myocardial infarction   . Asthma   . Atrial fibrillation ([place])   . Chronic anticoagulation   . Chronic nausea   . Chronic pain of left knee   . Coronary artery disease   . Current chronic use of systemic steroids 05/11/2016  . Depression   . Diabetes mellitus    no insulin  . Diarrhea    acute  . Diverticulitis of intestine   . FH: colonic polyps    adenomataous  . GERD (gastroesophageal reflux disease)   . History of kidney stones   . Hyperlipidemia    pulmonary embolism 2000 and 09/2008  . Hypertension   . Hypothyroidism   . Insomnia   . Irritable bowel syndrome   . Myocardial infarction ([place])   . Paroxysmal atrial fibrillation ([facility])    normal LV function; episodes occurred in 205 and 09/2007  . Pedal edema   . Peripheral edema   . PONV (postoperative nausea and vomiting)   . Pulmonary embolism ([place])    2000/09/2008  . Rectal bleeding   . Sleep apnea   . Thyroid disease    hypothyroidism  . Tobacco user    stopped   . Venous stasis    Past Surgical History:  Past Surgical History:  Procedure Laterality Date  . ABDOMINAL HYSTERECTOMY    . APPENDECTOMY    . BALLOON DILATION N/A 11/11/2013   Procedure: BALLOON DILATION;  Surgeon: Rogene Houston, MD;  [facility];  Service: Endoscopy;  Laterality: N/A;  . BREAST REDUCTION SURGERY    . BREAST SURGERY    . CHOLECYSTECTOMY    .  CLEFT PALATE REPAIR     4 surgeries  . COLONOSCOPY  2011   negative  . COLONOSCOPY N/A 05/10/2014   Procedure: COLONOSCOPY;  Surgeon: Rogene Houston, MD;  [facility];  Service: Endoscopy;  Laterality: N/A;  . ESOPHAGEAL DILATION N/A 03/08/2016   Procedure: ESOPHAGEAL DILATION;  Surgeon: Rogene Houston, MD;  [facility];  Service: Endoscopy;  Laterality: N/A;  . ESOPHAGOGASTRODUODENOSCOPY N/A 11/11/2013   Procedure: ESOPHAGOGASTRODUODENOSCOPY (EGD);  Surgeon: Rogene Houston, MD;  [facility];  Service: Endoscopy;  Laterality: N/A;  200-moved to 100 Ann notified pt  . ESOPHAGOGASTRODUODENOSCOPY N/A 05/09/2014   Procedure: ESOPHAGOGASTRODUODENOSCOPY (EGD);  Surgeon: Rogene Houston, MD;  [facility];  Service: Endoscopy;  Laterality: N/A;  . ESOPHAGOGASTRODUODENOSCOPY N/A 03/08/2016   Procedure: ESOPHAGOGASTRODUODENOSCOPY (EGD);  Surgeon: Rogene Houston, MD;  [facility];  Service: Endoscopy;  Laterality: N/A;  2:20  . HEEL SPUR SURGERY    . KNEE SURGERY    . MALONEY DILATION N/A 11/11/2013   Procedure: Venia Minks DILATION;  Surgeon: Rogene Houston, MD;  [facility];  Service: Endoscopy;  Laterality: N/A;  . OOPHORECTOMY    . SAVORY DILATION N/A 11/11/2013   Procedure: SAVORY DILATION;  Surgeon:  Rogene Houston, MD;  [facility];  Service: Endoscopy;  Laterality: N/A;  . TONSILLECTOMY     HPI:  78 year old female with a history of paroxysmal atrial fibrillation, diabetes mellitus type 2, hypertension, dysphagia, pulmonary embolus, hypothyroidism, and esophageal dysmotility presenting with 1 week history of dysphagia and intermittent odynophagia. This has resulted in intermittent nausea and vomiting. Patient is a challenging historian, but in general she states that she had worsening of her dysphagia resulting in vomiting after eating oatmeal on 05/14/2019. She states that her symptoms have worsened over the past  week. Upon questioning specifically, it appears that her dysphagia difficulties are more with thicker type foods, and not particularly with thin liquids. It appears that her odynophagia has been intermittent, but she also states that she has chest wall pain with certain types of bodily movements. She denies any fevers, chills, shortness of breath, coughing, hemoptysis, diarrhea, dysuria, hematuria. Notably, the patient had an EGD on 03/08/2016 which did not show any endoscopic esophageal abnormalities to explain her dysphagia. On 05/04/2018, she had a barium swallow study which showed moderate diffuse impaired esophageal motility with mild relative narrowing of her GE junction. Pt had EGD yesterday which showed  benign-appearing intrinsic moderate stenosis s/p dilation. Felt odynophagia due to esophagitis and dysphagia due to benign-appearing esophageal stricture. She had left tonsillar abcess last February. 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   Assessment / Plan / Recommendation Clinical Impression  Clinical swallow evaluation completed at bedside. Pt reports regurgitation of foods and liquids which started a week ago. She underwent EGD yesterday showing esophagitis. Pt indicates a burning sensation and points to mid chest/sternum area to identify source of odynophagia. Oral motor examination reveals narrow appearing nasopharynx likely due to previous cleft palate and subsequent surgeries. Pt has oxygen nasal cannula in her nose, however unsure how much Pt is able to receive. Pt with some sound substitutions again likely due to previous cleft palate. She also indicates tonsillar abscess last February. Pt without overt signs of reduced airway protection, however she did demonstrate some regurgitation of liquids and puree when she took larger bites/sips. Recommend continuing diet as ordered (full liquids) and SLP will check back again tomorrow. Can consider MBSS, however would hope that treating  the esophagitis will alleviate symptoms.    SLP Visit Diagnosis: Dysphagia, unspecified (R13.10)    Aspiration Risk  Mild aspiration risk    Diet Recommendation Dysphagia 1 (Puree);Thin liquid   Liquid Administration via: Cup;Straw Medication Administration: Whole meds with liquid Supervision: Patient able to self feed Compensations: Slow rate;Small sips/bites Postural Changes: Seated upright at 90 degrees;Remain upright for at least 30 minutes after po intake    Other  Recommendations Oral Care Recommendations: Oral care BID;Staff/trained caregiver to provide oral care Other Recommendations: Clarify dietary restrictions   Follow up Recommendations (pending)      Frequency and Duration min 2x/week  1 week       Prognosis Prognosis for Safe Diet Advancement: Fair Barriers to Reach Goals: (esophageal component) Barriers/Prognosis Comment: esophagitis      Swallow Study   General Date of Onset: 05/15/19 HPI: 78 year old female with a history of paroxysmal atrial fibrillation, diabetes mellitus type 2, hypertension, dysphagia, pulmonary embolus, hypothyroidism, and esophageal dysmotility presenting with 1 week history of dysphagia and intermittent odynophagia. This has resulted in intermittent nausea and vomiting. Patient is a challenging historian, but in general she states that she had worsening of her dysphagia resulting in vomiting after eating oatmeal on  05/14/2019. She states that her symptoms have worsened over the past week. Upon questioning specifically, it appears that her dysphagia difficulties are more with thicker type foods, and not particularly with thin liquids. It appears that her odynophagia has been intermittent, but she also states that she has chest wall pain with certain types of bodily movements. She denies any fevers, chills, shortness of breath, coughing, hemoptysis, diarrhea, dysuria, hematuria. Notably, the patient had an EGD on 03/08/2016 which did not  show any endoscopic esophageal abnormalities to explain her dysphagia. On 05/04/2018, she had a barium swallow study which showed moderate diffuse impaired esophageal motility with mild relative narrowing of her GE junction. Pt had EGD yesterday which showed  benign-appearing intrinsic moderate stenosis s/p dilation. Felt odynophagia due to esophagitis and dysphagia due to benign-appearing esophageal stricture. She had left tonsillar abcess last February. 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 Type of Study: Bedside Swallow Evaluation Previous Swallow Assessment: EGD yesterday Diet Prior to this Study: (full liquids) Temperature Spikes Noted: No Respiratory Status: Nasal cannula History of Recent Intubation: No Behavior/Cognition: Alert;Cooperative;Pleasant mood Oral Cavity Assessment: Dry Oral Care Completed by SLP: Yes Oral Cavity - Dentition: Adequate natural dentition Vision: Functional for self-feeding Self-Feeding Abilities: Able to feed self Patient Positioning: Upright in bed Baseline Vocal Quality: Normal Volitional Cough: Strong Volitional Swallow: Able to elicit    Oral/Motor/Sensory Function Overall Oral Motor/Sensory Function: Within functional limits   Ice Chips Ice chips: Within functional limits Presentation: Spoon   Thin Liquid Thin Liquid: Within functional limits Presentation: Cup;Self Fed Other Comments: (Pt regurgitated when she took a very large sip)    Nectar Thick Nectar Thick Liquid: Not tested   Honey Thick Honey Thick Liquid: Not tested   Puree Puree: Within functional limits Presentation: Spoon;Self Fed Other Comments: Pt tolerated 1/2 tsp presentations and regurgitated larger bite   Solid     Solid: (Pt accepted one small piece of graham cracker)     Thank you,  Genene Churn, [place] 05/17/2019,4:57 PM

## 2019-05-17 NOTE — Progress Notes (Addendum)
PROGRESS NOTE  [name] SHF:[id number] DOB: 31-Aug-1940 DOA: 05/15/2019 PCP: Mikey Kirschner, MD   Brief History:  78 year old female with a history of paroxysmal atrial fibrillation, diabetes mellitus type 2, hypertension, dysphagia, pulmonary embolus, hypothyroidism, and esophageal dysmotility presenting with 1 week history of dysphagia and intermittent odynophagia.  This has resulted in intermittent nausea and vomiting.  Patient is a challenging historian, but in general she states that she had worsening of her dysphagia resulting in vomiting after eating oatmeal on 05/14/2019.  She states that her symptoms have worsened over the past week.  Upon questioning specifically, it appears that her dysphagia difficulties are more with thicker type foods, and not particularly with thin liquids.  It appears that her odynophagia has been intermittent, but she also states that she has chest wall pain with certain types of bodily movements.  She denies any fevers, chills, shortness of breath, coughing, hemoptysis, diarrhea, dysuria, hematuria.  Notably, the patient had an EGD on 03/08/2016 which did not show any endoscopic esophageal abnormalities to explain her dysphagia.  On 05/04/2018, she had a barium swallow study which showed moderate diffuse impaired esophageal motility with mild relative narrowing of her GE junction. In the emergency department, the patient was afebrile hemodynamically stable saturating 95% on room air.  BMP and CBC were essentially unremarkable.  CT of the chest showed dilatation of the main pulmonary artery to 3.8 cm and chronic thoracic spine compression fractures.  CT of the neck showed no acute inflammatory changes but showed cervical spondylosis C5-6.  Assessment/Plan: Dysphagia/odynophagia -GI consult appreciated -05/16/19 EGD--odynophagia due to esophagitis; benign appearing esophageal stricture; dilated -05/15/2019 CT neck--no acute inflammatory  changes -Notably, the patient was recently on a course of doxycycline approximately 1 week prior to this admission--this may have contributed to some dysphagia/odynophagia -still having dysphagia and odynophagia symptoms with difficulty tolerating full liquids -Continue PPI -speech therapy eval  Chest wall pain/atypical chest pain -Personally reviewed EKG--sinus rhythm, nonspecific T wave change -05/15/2019 CT chest--no lung masses, no consolidation, no edema -Personally reviewed chest x-ray--no edema or consolidation -Personally reviewed EKG--atrial fibrillation, nonspecific T wave change  Paroxysmal atrial fibrillation -Rate controlled -Continue diltiazem CD 180 mg daily -restart warfarin  History of PE -restart warfarin when ok with GI  Essential hypertension -Continue losartan and diltiazem CD  Hyperlipidemia -Continue statin  Hypothyroidism -Continue Synthroid         Disposition Plan:   [place] in 1-2 days if able to tolerate diet Family Communication:   No Family at bedside  Consultants:  none  Code Status:   DNR  DVT Prophylaxis:  [place] Heparin    Procedures: As Listed in Progress Note Above  Antibiotics: None    Subjective: Pt still complains of dysphagia and odynophagia.  Denies, fever, chills, diarrhea.  Still having "regurgitation"  Denies sob, abd pain, diarrhea  Objective: Vitals:   05/17/19 0312 05/17/19 0320 05/17/19 0800 05/17/19 1500  BP: 132/61  (!) 136/55 (!) 107/49  Pulse: (!) 48 (!) 56 (!) 53 (!) 56  Resp: 16     Temp: 97.7 F (36.5 C)  97.7 F (36.5 C) 97.8 F (36.6 C)  TempSrc: Oral  Oral Oral  SpO2: 94%  94% 98%  Weight: 99 kg     Height:        Intake/Output Summary (Last 24 hours) at 05/17/2019 1538 Last data filed at 05/17/2019 1456 Gross per 24 hour  Intake 2161.06 ml  Output  700 ml  Net 1461.06 ml   Weight change: -0.338 kg Exam:   General:  Pt is alert, follows commands appropriately,  not in acute distress  HEENT: No icterus, No thrush, No neck mass, [place]/AT  Cardiovascular: RRR, S1/S2, no rubs, no gallops  Respiratory: bibasilar rales. No wheezing  Abdomen: Soft/+BS, non tender, non distended, no guarding  Extremities: No edema, No lymphangitis, No petechiae, No rashes, no synovitis   Data Reviewed: I have personally reviewed following labs and imaging studies Basic Metabolic Panel: Recent Labs  Lab 05/15/19 1443 05/16/19 0905  NA 142 142  K 4.7 4.1  CL 103 104  CO2 30 28  GLUCOSE 92 79  BUN 17 16  CREATININE 0.83 0.68  CALCIUM 9.1 9.1   Liver Function Tests: Recent Labs  Lab 05/16/19 0905  AST 17  ALT 12  ALKPHOS 69  BILITOT 0.9  PROT 7.0  ALBUMIN 3.4*   No results for input(s): LIPASE, AMYLASE in the last 168 hours. No results for input(s): AMMONIA in the last 168 hours. Coagulation Profile: Recent Labs  Lab 05/15/19 1445 05/16/19 0905  INR 2.2* 1.7*   CBC: Recent Labs  Lab 05/15/19 1443 05/16/19 0905  WBC 8.7 9.0  NEUTROABS 6.1  --   HGB 11.8* 13.2  HCT 39.7 43.7  MCV 94.3 92.8  PLT 279 275   Cardiac Enzymes: No results for input(s): CKTOTAL, CKMB, CKMBINDEX, TROPONINI in the last 168 hours. BNP: Invalid input(s): POCBNP CBG: Recent Labs  Lab 05/16/19 1447 05/16/19 1536 05/16/19 2142 05/17/19 0731 05/17/19 1103  GLUCAP 69* 142* 108* 85 117*   HbA1C: No results for input(s): HGBA1C in the last 72 hours. Urine analysis:    Component Value Date/Time   COLORURINE YELLOW 04/24/2018 1020   APPEARANCEUR HAZY (A) 04/24/2018 1020   LABSPEC 1.029 04/24/2018 1020   PHURINE 6.0 04/24/2018 1020   GLUCOSEU NEGATIVE 04/24/2018 1020   HGBUR SMALL (A) 04/24/2018 1020   BILIRUBINUR NEGATIVE 04/24/2018 1020   KETONESUR NEGATIVE 04/24/2018 1020   PROTEINUR NEGATIVE 04/24/2018 1020   UROBILINOGEN 0.2 05/08/2014 2240   NITRITE NEGATIVE 04/24/2018 1020   LEUKOCYTESUR LARGE (A) 04/24/2018 1020   Sepsis  Labs: @LABRCNTIP (procalcitonin:4,lacticidven:4) ) Recent Results (from the past 240 hour(s))  SARS CORONAVIRUS 2 (Fatoumata Albaugh 6-24 HRS) Nasopharyngeal Nasopharyngeal Swab     Status: None   Collection Time: 05/15/19  9:30 PM   Specimen: Nasopharyngeal Swab  Result Value Ref Range Status   SARS Coronavirus 2 NEGATIVE NEGATIVE Final    Comment: (NOTE) SARS-CoV-2 target nucleic acids are NOT DETECTED. The SARS-CoV-2 RNA is generally detectable in upper and lower respiratory specimens during the acute phase of infection. Negative results do not preclude SARS-CoV-2 infection, do not rule out co-infections with other pathogens, and should not be used as the sole basis for treatment or other patient management decisions. Negative results must be combined with clinical observations, patient history, and epidemiological information. The expected result is Negative. Fact Sheet for Patients: SugarRoll.be Fact Sheet for Healthcare Providers: https://www.woods-mathews.com/ This test is not yet approved or cleared by the [place] FDA and  has been authorized for detection and/or diagnosis of SARS-CoV-2 by FDA under an Emergency Use Authorization (EUA). This EUA will remain  in effect (meaning this test can be used) for the duration of the COVID-19 declaration under Section 56 4(b)(1) of the Act, 21 U.S.C. section 360bbb-3(b)(1), unless the authorization is terminated or revoked sooner. Performed at [facility], [address] [place],  [place] [address]   MRSA PCR Screening     Status: None   Collection Time: 05/16/19  4:58 AM   Specimen: Nasopharyngeal  Result Value Ref Range Status   MRSA by PCR NEGATIVE NEGATIVE Final    Comment:        The GeneXpert MRSA Assay (FDA approved for NASAL specimens only), is one component of a comprehensive MRSA colonization surveillance program. It is not intended to diagnose MRSA infection nor to guide  or monitor treatment for MRSA infections. Performed at [facility], [address], [place], [place] [address]   SARS Coronavirus 2 by RT PCR (hospital order, performed in [place] hospital lab)     Status: None   Collection Time: 05/16/19  1:00 PM  Result Value Ref Range Status   SARS Coronavirus 2 NEGATIVE NEGATIVE Final    Comment: (NOTE) If result is NEGATIVE SARS-CoV-2 target nucleic acids are NOT DETECTED. The SARS-CoV-2 RNA is generally detectable in upper and lower  respiratory specimens during the acute phase of infection. The lowest  concentration of SARS-CoV-2 viral copies this assay can detect is 250  copies / mL. A negative result does not preclude SARS-CoV-2 infection  and should not be used as the sole basis for treatment or other  patient management decisions.  A negative result may occur with  improper specimen collection / handling, submission of specimen other  than nasopharyngeal swab, presence of viral mutation(s) within the  areas targeted by this assay, and inadequate number of viral copies  (<250 copies / mL). A negative result must be combined with clinical  observations, patient history, and epidemiological information. If result is POSITIVE SARS-CoV-2 target nucleic acids are DETECTED. The SARS-CoV-2 RNA is generally detectable in upper and lower  respiratory specimens dur ing the acute phase of infection.  Positive  results are indicative of active infection with SARS-CoV-2.  Clinical  correlation with patient history and other diagnostic information is  necessary to determine patient infection status.  Positive results do  not rule out bacterial infection or co-infection with other viruses. If result is PRESUMPTIVE POSTIVE SARS-CoV-2 nucleic acids MAY BE PRESENT.   A presumptive positive result was obtained on the submitted specimen  and confirmed on repeat testing.  While 2019 novel coronavirus  (SARS-CoV-2) nucleic acids may be present in the  submitted sample  additional confirmatory testing may be necessary for epidemiological  and / or clinical management purposes  to differentiate between  SARS-CoV-2 and other Sarbecovirus currently known to infect humans.  If clinically indicated additional testing with an alternate test  methodology [phone]) is advised. The SARS-CoV-2 RNA is generally  detectable in upper and lower respiratory sp ecimens during the acute  phase of infection. The expected result is Negative. Fact Sheet for Patients:  StrictlyIdeas.no Fact Sheet for Healthcare Providers: BankingDealers.co.za This test is not yet approved or cleared by the [place] FDA and has been authorized for detection and/or diagnosis of SARS-CoV-2 by FDA under an Emergency Use Authorization (EUA).  This EUA will remain in effect (meaning this test can be used) for the duration of the COVID-19 declaration under Section 564(b)(1) of the Act, 21 U.S.C. section 360bbb-3(b)(1), unless the authorization is terminated or revoked sooner. Performed at [facility], [address] [place], [place] [address]      Scheduled Meds:  atorvastatin  40 mg Oral Daily   diltiazem  180 mg Oral Daily   escitalopram  20 mg Oral Daily   furosemide  40  mg Oral Daily   heparin  5,000 Units Subcutaneous Q8H   levothyroxine  75 mcg Oral Daily   losartan  25 mg Oral Daily   oxyCODONE-acetaminophen  1 tablet Oral TID AC   pantoprazole (PROTONIX) IV  40 mg Intravenous BID AC   warfarin  5 mg Oral Once   Warfarin - Pharmacist Dosing Inpatient   Does not apply q1800   Continuous Infusions:  sodium chloride 100 mL/hr at 05/17/19 1427   sodium chloride      Procedures/Studies: Dg Chest 2 View  Result Date: 05/15/2019 CLINICAL DATA:  Chest pain EXAM: CHEST - 2 VIEW COMPARISON:  04/25/2018 FINDINGS: The heart size and mediastinal contours are enlarged, stable. Calcific aortic knob. No  focal airspace consolidation, pleural effusion, or pneumothorax. IMPRESSION: No active cardiopulmonary disease. Electronically Signed   By: Davina Poke M.D.   On: 05/15/2019 15:09   Ct Soft Tissue Neck W Contrast  Result Date: 05/15/2019 CLINICAL DATA:  Initial evaluation for acute pain with swallowing since yesterday. EXAM: CT NECK WITH CONTRAST TECHNIQUE: Multidetector CT imaging of the neck was performed using the standard protocol following the bolus administration of intravenous contrast. CONTRAST:  53mL OMNIPAQUE IOHEXOL 300 MG/ML  SOLN COMPARISON:  None available. FINDINGS: Pharynx and larynx: Oral cavity within normal limits without discrete mass or collection. No acute inflammatory changes seen about the dentition. [place]ine tonsils symmetric and within normal limits. Parapharyngeal fat maintained. Remainder of the oropharynx and visualized nasopharynx within normal limits. No retropharyngeal collection. Epiglottis within normal limits. Vallecula clear. Remainder of the hypopharynx and supraglottic larynx within normal limits without appreciable swelling or inflammatory changes. True cords are opposed and not well assessed, although grossly symmetric and within normal limits. Subglottic airway clear. Salivary glands: Salivary glands including the parotid and submandibular glands are within normal limits. No sialolithiasis or acute sialoadenitis. Thyroid: Thyroid within normal limits. Lymph nodes: No pathologically enlarged or concerning adenopathy seen within the neck. Vascular: Moderate atherosclerotic change present about the aortic arch and carotid bifurcations. Associated stenosis of up to approximately 60% noted at the origin of the left ICA. Right carotid artery system medialized into the retropharyngeal space. Limited intracranial: Unremarkable. Visualized orbits: Globes and orbital soft tissues not visualized on this exam. Mastoids and visualized paranasal sinuses: Visualized maxillary  sinuses are clear. Mastoid air cells and middle ear cavities not included on this exam. Skeleton: No acute osseous abnormality. Sclerotic lesion within the C6 vertebral body most characteristic of a benign bone island. No other discrete or worrisome osseous lesions. Moderate cervical spondylosis noted at C5-6. Moderate Upper chest: Visualized upper chest demonstrates no acute finding. Other: None. IMPRESSION: 1. Negative CT of the neck. No acute inflammatory changes or other abnormality identified. 2. Aortic Atherosclerosis (ICD10-I70.0). 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 3. Moderate cervical spondylosis at C5-6. Electronically Signed   By: Jeannine Boga M.D.   On: 05/15/2019 21:34   Ct Chest Wo Contrast  Result Date: 05/15/2019 CLINICAL DATA:  Unexplained dysphagia. EXAM: CT CHEST WITHOUT CONTRAST TECHNIQUE: Multidetector CT imaging of the chest was performed following the standard protocol without IV contrast. COMPARISON:  None. FINDINGS: Cardiovascular: The heart size is relatively normal. Aortic calcifications are noted. The main pulmonary artery is significantly dilated measuring 3.8 cm in diameter. Mediastinum/Nodes: --No mediastinal or hilar lymphadenopathy. --No axillary lymphadenopathy. --No supraclavicular lymphadenopathy. --Normal thyroid gland. --The esophagus is unremarkable Lungs/Pleura: No pulmonary nodules or masses. No pleural effusion or pneumothorax. No focal airspace consolidation.  No focal pleural abnormality. Upper Abdomen: The patient is status post cholecystectomy. Musculoskeletal: There are multilevel compression fractures throughout the thoracic spine, all of which appear to be chronic. There is a calcified left breast mass, presumably benign. Review of the MIP images confirms the above findings. IMPRESSION: 1. No acute cardiopulmonary process. 2. The main pulmonary artery is significantly dilated measuring 3.8 cm in diameter. This can be seen  with pulmonary arterial hypertension. Aortic Atherosclerosis (ICD10-I70.0). Electronically Signed   By: Constance Holster M.D.   On: 05/15/2019 21:10    Orson Eva, DO  [facility] Pager [phone]  If 7PM-7AM, please contact night-coverage [email or web address] Password [email or web address] 05/17/2019, 3:38 PM   LOS: 0 days

## 2019-05-18 ENCOUNTER — Encounter (HOSPITAL_COMMUNITY): Payer: Self-pay | Admitting: Gastroenterology

## 2019-05-18 DIAGNOSIS — R1310 Dysphagia, unspecified: Secondary | ICD-10-CM

## 2019-05-18 LAB — GLUCOSE, CAPILLARY
Glucose-Capillary: 89 mg/dL (ref 70–99)
Glucose-Capillary: 107 mg/dL — ABNORMAL HIGH (ref 70–99)
Glucose-Capillary: 96 mg/dL (ref 70–99)
Glucose-Capillary: 107 mg/dL — ABNORMAL HIGH (ref 70–99)

## 2019-05-18 LAB — MAGNESIUM: Magnesium: 1.6 mg/dL — ABNORMAL LOW (ref 1.7–2.4)

## 2019-05-18 LAB — BASIC METABOLIC PANEL
Anion gap: 10 (ref 5–15)
BUN: 13 mg/dL (ref 8–23)
CO2: 24 mmol/L (ref 22–32)
Calcium: 8.3 mg/dL — ABNORMAL LOW (ref 8.9–10.3)
Chloride: 109 mmol/L (ref 98–111)
Creatinine, Ser: 0.89 mg/dL (ref 0.44–1.00)
GFR calc Af Amer: >60 mL/min (ref >60)
GFR calc non Af Amer: >60 mL/min (ref >60)
Glucose, Bld: 85 mg/dL (ref 70–99)
Potassium: 3.5 mmol/L (ref 3.5–5.1)
Sodium: 143 mmol/L (ref 135–145)

## 2019-05-18 LAB — PROTIME-INR
INR: 1.3 — ABNORMAL HIGH (ref 0.8–1.2)
Prothrombin Time: 15.7 seconds — ABNORMAL HIGH (ref 11.4–15.2)

## 2019-05-18 MED ORDER — MAGNESIUM SULFATE 2 GM/50ML IV SOLN
2.0000 g | Freq: Once | INTRAVENOUS | Status: AC
  Administered 2019-05-18: 2 g via INTRAVENOUS
  Filled 2019-05-18: qty 50

## 2019-05-18 MED ORDER — POTASSIUM CHLORIDE 20 MEQ/15ML (10%) PO SOLN
20.0000 meq | Freq: Once | ORAL | Status: AC
  Administered 2019-05-18: 20 meq via ORAL
  Filled 2019-05-18: qty 30

## 2019-05-18 MED ORDER — WARFARIN SODIUM 7.5 MG PO TABS
7.5000 mg | ORAL_TABLET | Freq: Once | ORAL | Status: AC
  Administered 2019-05-18: 7.5 mg via ORAL
  Filled 2019-05-18: qty 1

## 2019-05-18 NOTE — Progress Notes (Signed)
PROGRESS NOTE  [name] WJX:[id number] DOB: [birth year] DOA: 05/15/2019 PCP: Mikey Kirschner, MD  Brief History: 78 year old female with a history of paroxysmal atrial fibrillation, diabetes mellitus type 2, hypertension, dysphagia, pulmonary embolus, hypothyroidism, and esophageal dysmotility presenting with 1 week history of dysphagia and intermittent odynophagia. This has resulted in intermittent nausea and vomiting. Patient is a challenging historian, but in general she states that she had worsening of her dysphagia resulting in vomiting after eating oatmeal on 05/14/2019. She states that her symptoms have worsened over the past week. Upon questioning specifically, it appears that her dysphagia difficulties are more with thicker type foods, and not particularly with thin liquids. It appears that her odynophagia has been intermittent, but she also states that she has chest wall pain with certain types of bodily movements. She denies any fevers, chills, shortness of breath, coughing, hemoptysis, diarrhea, dysuria, hematuria. Notably, the patient had an EGD on 03/08/2016 which did not show any endoscopic esophageal abnormalities to explain her dysphagia. On 05/04/2018, she had a barium swallow study which showed moderate diffuse impaired esophageal motility with mild relative narrowing of her GE junction. In the emergency department, the patient was afebrile hemodynamically stable saturating 95% on room air. BMP and CBC were essentially unremarkable. CT of the chest showed dilatation of the main pulmonary artery to 3.8 cm and chronic thoracic spine compression fractures. CT of the neck showed no acute inflammatory changes but showed cervical spondylosis C5-6.  Assessment/Plan: Dysphagia/odynophagia -GI consult appreciated -05/16/19 EGD--odynophagia due to esophagitis; benign appearing esophageal stricture; dilated -05/15/2019 CT neck--no acute inflammatory  changes -Notably, the patient was recently on a course of doxycycline approximately 1 week prior to this admission--this may have contributed to some dysphagia/odynophagia -still having dysphagia and odynophagia symptoms with difficulty tolerating full liquids but a little better -Continue PPI--increase to bid x one month, then daily -speech therapy eval--dys 1 diet if no improvement -down grade to dys 3 diet  Chest wall pain/atypical chest pain -Personally reviewed EKG--sinus rhythm, nonspecific T wave change -05/15/2019 CT chest--no lung masses, no consolidation, no edema -Personally reviewed chest x-ray--no edema or consolidation -Personally reviewed EKG--atrial fibrillation, nonspecific T wave change  Paroxysmal atrialfibrillation -Rate controlled -Continue diltiazem CD 180 mg daily -restart warfarin  History of PE -restart warfarin   Essential hypertension -Continue losartan and diltiazem CD  Hyperlipidemia -Continue statin  Hypothyroidism -Continue Synthroid         Disposition Plan:[place] 2-1HYQM if able to tolerate diet Family Communication:NoFamily at bedside  Consultants:none  Code Status: DNR  DVT Prophylaxis: [place] Heparin    Procedures: As Listed in Progress Note Above  Antibiotics: None     Subjective: Pt still having dysphagia and odynophagia but no regurgitation today.  Pain Is a little better.  Denies f/c, cp, sob, abd pain, diarrhea.  Objective: Vitals:   05/17/19 1500 05/17/19 2127 05/18/19 0455 05/18/19 1518  BP: (!) 107/49 126/63 133/63 (!) 101/48  Pulse: (!) 56 (!) 59 65 (!) 57  Resp:  20 20 16   Temp: 97.8 F (36.6 C) 98.1 F (36.7 C) 98.3 F (36.8 C) 98.3 F (36.8 C)  TempSrc: Oral Oral  Oral  SpO2: 98% 97% 100% 94%  Weight:      Height:        Intake/Output Summary (Last 24 hours) at 05/18/2019 1820 Last data filed at 05/18/2019 1500 Gross per 24 hour  Intake 3451.69 ml  Output  1650 ml  Net  1801.69 ml   Weight change:  Exam:   General:  Pt is alert, follows commands appropriately, not in acute distress  HEENT: No icterus, No thrush, No neck mass, [place]/AT  Cardiovascular: RRR, S1/S2, no rubs, no gallops  Respiratory: CTA bilaterally, no wheezing, no crackles, no rhonchi  Abdomen: Soft/+BS, non tender, non distended, no guarding  Extremities: No edema, No lymphangitis, No petechiae, No rashes, no synovitis   Data Reviewed: I have personally reviewed following labs and imaging studies Basic Metabolic Panel: Recent Labs  Lab 05/15/19 1443 05/16/19 0905 05/18/19 0624  NA 142 142 143  K 4.7 4.1 3.5  CL 103 104 109  CO2 30 28 24   GLUCOSE 92 79 85  BUN 17 16 13   CREATININE 0.83 0.68 0.89  CALCIUM 9.1 9.1 8.3*  MG  --   --  1.6*   Liver Function Tests: Recent Labs  Lab 05/16/19 0905  AST 17  ALT 12  ALKPHOS 69  BILITOT 0.9  PROT 7.0  ALBUMIN 3.4*   No results for input(s): LIPASE, AMYLASE in the last 168 hours. No results for input(s): AMMONIA in the last 168 hours. Coagulation Profile: Recent Labs  Lab 05/15/19 1445 05/16/19 0905 05/18/19 0624  INR 2.2* 1.7* 1.3*   CBC: Recent Labs  Lab 05/15/19 1443 05/16/19 0905  WBC 8.7 9.0  NEUTROABS 6.1  --   HGB 11.8* 13.2  HCT 39.7 43.7  MCV 94.3 92.8  PLT 279 275   Cardiac Enzymes: No results for input(s): CKTOTAL, CKMB, CKMBINDEX, TROPONINI in the last 168 hours. BNP: Invalid input(s): POCBNP CBG: Recent Labs  Lab 05/17/19 1643 05/17/19 2124 05/18/19 0754 05/18/19 1132 05/18/19 1635  GLUCAP 76 175* 89 107* 96   HbA1C: No results for input(s): HGBA1C in the last 72 hours. Urine analysis:    Component Value Date/Time   COLORURINE YELLOW 04/24/2018 1020   APPEARANCEUR HAZY (A) 04/24/2018 1020   LABSPEC 1.029 04/24/2018 1020   PHURINE 6.0 04/24/2018 1020   GLUCOSEU NEGATIVE 04/24/2018 1020   HGBUR SMALL (A) 04/24/2018 1020   BILIRUBINUR NEGATIVE 04/24/2018 1020    KETONESUR NEGATIVE 04/24/2018 1020   PROTEINUR NEGATIVE 04/24/2018 1020   UROBILINOGEN 0.2 05/08/2014 2240   NITRITE NEGATIVE 04/24/2018 1020   LEUKOCYTESUR LARGE (A) 04/24/2018 1020   Sepsis Labs: @LABRCNTIP (procalcitonin:4,lacticidven:4) ) Recent Results (from the past 240 hour(s))  SARS CORONAVIRUS 2 (Areeb Corron 6-24 HRS) Nasopharyngeal Nasopharyngeal Swab     Status: None   Collection Time: 05/15/19  9:30 PM   Specimen: Nasopharyngeal Swab  Result Value Ref Range Status   SARS Coronavirus 2 NEGATIVE NEGATIVE Final    Comment: (NOTE) SARS-CoV-2 target nucleic acids are NOT DETECTED. The SARS-CoV-2 RNA is generally detectable in upper and lower respiratory specimens during the acute phase of infection. Negative results do not preclude SARS-CoV-2 infection, do not rule out co-infections with other pathogens, and should not be used as the sole basis for treatment or other patient management decisions. Negative results must be combined with clinical observations, patient history, and epidemiological information. The expected result is Negative. Fact Sheet for Patients: SugarRoll.be Fact Sheet for Healthcare Providers: https://www.woods-mathews.com/ This test is not yet approved or cleared by the [place] FDA and  has been authorized for detection and/or diagnosis of SARS-CoV-2 by FDA under an Emergency Use Authorization (EUA). This EUA will remain  in effect (meaning this test can be used) for the duration of the COVID-19 declaration under Section 56 4(b)(1) of the Act, 21 U.S.C. section  360bbb-3(b)(1), unless the authorization is terminated or revoked sooner. Performed at [facility], [place] [address], [place], [place] [address]   MRSA PCR Screening     Status: None   Collection Time: 05/16/19  4:58 AM   Specimen: Nasopharyngeal  Result Value Ref Range Status   MRSA by PCR NEGATIVE NEGATIVE Final    Comment:        The GeneXpert  MRSA Assay (FDA approved for NASAL specimens only), is one component of a comprehensive MRSA colonization surveillance program. It is not intended to diagnose MRSA infection nor to guide or monitor treatment for MRSA infections. Performed at [facility], [address], [place], [place] [address]   SARS Coronavirus 2 by RT PCR (hospital order, performed in [place] hospital lab)     Status: None   Collection Time: 05/16/19  1:00 PM  Result Value Ref Range Status   SARS Coronavirus 2 NEGATIVE NEGATIVE Final    Comment: (NOTE) If result is NEGATIVE SARS-CoV-2 target nucleic acids are NOT DETECTED. The SARS-CoV-2 RNA is generally detectable in upper and lower  respiratory specimens during the acute phase of infection. The lowest  concentration of SARS-CoV-2 viral copies this assay can detect is 250  copies / mL. A negative result does not preclude SARS-CoV-2 infection  and should not be used as the sole basis for treatment or other  patient management decisions.  A negative result may occur with  improper specimen collection / handling, submission of specimen other  than nasopharyngeal swab, presence of viral mutation(s) within the  areas targeted by this assay, and inadequate number of viral copies  (<250 copies / mL). A negative result must be combined with clinical  observations, patient history, and epidemiological information. If result is POSITIVE SARS-CoV-2 target nucleic acids are DETECTED. The SARS-CoV-2 RNA is generally detectable in upper and lower  respiratory specimens dur ing the acute phase of infection.  Positive  results are indicative of active infection with SARS-CoV-2.  Clinical  correlation with patient history and other diagnostic information is  necessary to determine patient infection status.  Positive results do  not rule out bacterial infection or co-infection with other viruses. If result is PRESUMPTIVE POSTIVE SARS-CoV-2 nucleic acids MAY BE PRESENT.    A presumptive positive result was obtained on the submitted specimen  and confirmed on repeat testing.  While 2019 novel coronavirus  (SARS-CoV-2) nucleic acids may be present in the submitted sample  additional confirmatory testing may be necessary for epidemiological  and / or clinical management purposes  to differentiate between  SARS-CoV-2 and other Sarbecovirus currently known to infect humans.  If clinically indicated additional testing with an alternate test  methodology [phone]) is advised. The SARS-CoV-2 RNA is generally  detectable in upper and lower respiratory sp ecimens during the acute  phase of infection. The expected result is Negative. Fact Sheet for Patients:  StrictlyIdeas.no Fact Sheet for Healthcare Providers: BankingDealers.co.za This test is not yet approved or cleared by the [place] FDA and has been authorized for detection and/or diagnosis of SARS-CoV-2 by FDA under an Emergency Use Authorization (EUA).  This EUA will remain in effect (meaning this test can be used) for the duration of the COVID-19 declaration under Section 564(b)(1) of the Act, 21 U.S.C. section 360bbb-3(b)(1), unless the authorization is terminated or revoked sooner. Performed at [facility], [address] [place], [place] [address]      Scheduled Meds:  atorvastatin  40 mg Oral Daily  diltiazem  180 mg Oral Daily   escitalopram  20 mg Oral Daily   furosemide  40 mg Oral Daily   heparin  5,000 Units Subcutaneous Q8H   levothyroxine  75 mcg Oral Daily   lidocaine  10 mL Mouth/Throat TID WC & HS   losartan  25 mg Oral Daily   oxyCODONE-acetaminophen  1 tablet Oral TID AC   pantoprazole (PROTONIX) IV  40 mg Intravenous BID AC   potassium chloride  20 mEq Oral Once   Warfarin - Pharmacist Dosing Inpatient   Does not apply q1800   Continuous Infusions:  magnesium sulfate bolus IVPB      Procedures/Studies: Dg  Chest 2 View  Result Date: 05/15/2019 CLINICAL DATA:  Chest pain EXAM: CHEST - 2 VIEW COMPARISON:  04/25/2018 FINDINGS: The heart size and mediastinal contours are enlarged, stable. Calcific aortic knob. No focal airspace consolidation, pleural effusion, or pneumothorax. IMPRESSION: No active cardiopulmonary disease. Electronically Signed   By: Davina Poke M.D.   On: 05/15/2019 15:09   Ct Soft Tissue Neck W Contrast  Result Date: 05/15/2019 CLINICAL DATA:  Initial evaluation for acute pain with swallowing since yesterday. EXAM: CT NECK WITH CONTRAST TECHNIQUE: Multidetector CT imaging of the neck was performed using the standard protocol following the bolus administration of intravenous contrast. CONTRAST:  16mL OMNIPAQUE IOHEXOL 300 MG/ML  SOLN COMPARISON:  None available. FINDINGS: Pharynx and larynx: Oral cavity within normal limits without discrete mass or collection. No acute inflammatory changes seen about the dentition. [place] tonsils symmetric and within normal limits. Parapharyngeal fat maintained. Remainder of the oropharynx and visualized nasopharynx within normal limits. No retropharyngeal collection. Epiglottis within normal limits. Vallecula clear. Remainder of the hypopharynx and supraglottic larynx within normal limits without appreciable swelling or inflammatory changes. True cords are opposed and not well assessed, although grossly symmetric and within normal limits. Subglottic airway clear. Salivary glands: Salivary glands including the parotid and submandibular glands are within normal limits. No sialolithiasis or acute sialoadenitis. Thyroid: Thyroid within normal limits. Lymph nodes: No pathologically enlarged or concerning adenopathy seen within the neck. Vascular: Moderate atherosclerotic change present about the aortic arch and carotid bifurcations. Associated stenosis of up to approximately 60% noted at the origin of the left ICA. Right carotid artery system medialized into  the retropharyngeal space. Limited intracranial: Unremarkable. Visualized orbits: Globes and orbital soft tissues not visualized on this exam. Mastoids and visualized paranasal sinuses: Visualized maxillary sinuses are clear. Mastoid air cells and middle ear cavities not included on this exam. Skeleton: No acute osseous abnormality. Sclerotic lesion within the C6 vertebral body most characteristic of a benign bone island. No other discrete or worrisome osseous lesions. Moderate cervical spondylosis noted at C5-6. Moderate Upper chest: Visualized upper chest demonstrates no acute finding. Other: None. IMPRESSION: 1. Negative CT of the neck. No acute inflammatory changes or other abnormality identified. 2. Aortic Atherosclerosis (ICD10-I70.0). 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 3. Moderate cervical spondylosis at C5-6. Electronically Signed   By: Jeannine Boga M.D.   On: 05/15/2019 21:34   Ct Chest Wo Contrast  Result Date: 05/15/2019 CLINICAL DATA:  Unexplained dysphagia. EXAM: CT CHEST WITHOUT CONTRAST TECHNIQUE: Multidetector CT imaging of the chest was performed following the standard protocol without IV contrast. COMPARISON:  None. FINDINGS: Cardiovascular: The heart size is relatively normal. Aortic calcifications are noted. The main pulmonary artery is significantly dilated measuring 3.8 cm in diameter. Mediastinum/Nodes: --No mediastinal or hilar lymphadenopathy. --No axillary lymphadenopathy. --No  supraclavicular lymphadenopathy. --Normal thyroid gland. --The esophagus is unremarkable Lungs/Pleura: No pulmonary nodules or masses. No pleural effusion or pneumothorax. No focal airspace consolidation. No focal pleural abnormality. Upper Abdomen: The patient is status post cholecystectomy. Musculoskeletal: There are multilevel compression fractures throughout the thoracic spine, all of which appear to be chronic. There is a calcified left breast mass, presumably benign.  Review of the MIP images confirms the above findings. IMPRESSION: 1. No acute cardiopulmonary process. 2. The main pulmonary artery is significantly dilated measuring 3.8 cm in diameter. This can be seen with pulmonary arterial hypertension. Aortic Atherosclerosis (ICD10-I70.0). Electronically Signed   By: Constance Holster M.D.   On: 05/15/2019 21:10    Orson Eva, DO  [facility] Pager [phone]  If 7PM-7AM, please contact night-coverage [email or web address] Password [email or web address] 05/18/2019, 6:20 PM   LOS: 1 day

## 2019-05-18 NOTE — Progress Notes (Addendum)
Subjective: Has pancake and sausage this morning. Ate about 3 bites of pancake. Thinks these went down. Drank milk and some water and has been regurgitating liquid. Denies nausea or vomiting. Reports some pain when swallowing her pancake from her sternal notch down to her umbilicus. No pain at this time. States she hasn't really paid attention to her pain. She ate a bite of pancake while I was in the room and said it went down fine. Minimal pain. Drank a sip of water. Had a few seconds of pain, but overall stated this was much improved compared to a few days ago. Felt water was slow to go down. No other abdominal pain.    Objective: Vital signs in last 24 hours: Temp:  [97.7 F (36.5 C)-98.3 F (36.8 C)] 98.3 F (36.8 C) (11/24 0455) Pulse Rate:  [53-65] 65 (11/24 0455) Resp:  [20] 20 (11/24 0455) BP: (107-136)/(49-63) 133/63 (11/24 0455) SpO2:  [94 %-100 %] 100 % (11/24 0455) Last BM Date: 05/17/19 General:   Alert and oriented, pleasant Head:  Normocephalic and atraumatic. Eyes:  No icterus, sclera clear. Conjuctiva pink.  Abdomen:  Obese abdomen. Bowel sounds present, soft, non-tender, non-distended. Extremities:  Without edema. SCDs in place.  Neurologic:  Alert and  oriented x4;  grossly normal neurologically. Psych:  Normal mood and affect.  Intake/Output from previous day: 11/23 0701 - 11/24 0700 In: 2642.9 [P.O.:360; I.V.:2282.9] Out: 800 [Urine:800] Intake/Output this shift: No intake/output data recorded.  Lab Results: Recent Labs    05/15/19 1443 05/16/19 0905  WBC 8.7 9.0  HGB 11.8* 13.2  HCT 39.7 43.7  PLT 279 275   BMET Recent Labs    05/15/19 1443 05/16/19 0905 05/18/19 0624  NA 142 142 143  K 4.7 4.1 3.5  CL 103 104 109  CO2 30 28 24   GLUCOSE 92 79 85  BUN 17 16 13   CREATININE 0.83 0.68 0.89  CALCIUM 9.1 9.1 8.3*   LFT Recent Labs    05/16/19 0905  PROT 7.0  ALBUMIN 3.4*  AST 17  ALT 12  ALKPHOS 69  BILITOT 0.9   PT/INR Recent  Labs    05/16/19 0905 05/18/19 0624  LABPROT 19.5* 15.7*  INR 1.7* 1.3*    Assessment: 78 year old female admitted with odynophagia and dysphagia on 05/15/2019 who underwent EGD on 05/16/2019 noting benign-appearing intrinsic moderate stenosis s/p dilation.  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  Plans to treat with PPI twice daily x1 month then daily forever.  She continued with odynophagia on 05/17/2019 and viscous lidocaine was added and Percocet was increased.  Patient notes some improvement with dysphagia and odynophagia today.  Feels her pancake this morning was going down well.  She did note some regurgitation of liquid after drinking milk and water.  Minimal pain with swallowing her pancake and short-lived but a little more intense pain when drinking cold water.  She has known esophageal dysmotility noted on BPE in November 2019.  Suspect this is likely playing a role as well.  As patient has noted some improvement, will plan to continue her current therapy with PPI twice daily and viscous lidocaine.  As patient is tolerating her soft, mechanical diet this morning, will hold off on speech evaluation for now.  Plan: Continue Protonix 40 mg twice daily x1 month then daily forever. Continue viscous lidocaine before meals and at bedtime. Continue mechanical soft diet. Sit upright while eating and do not lay  down for 3 hours after eating. Repeat EGD in 1 month with Dr. Laural Golden for retreatment. See how patient does today with lunch/dinner. May need to consider speech evaluation to determine most appropriate textures if she does not tolerate her diet well.   LOS: 1 day    05/18/2019, 7:58 AM   Aliene Altes, PA-C [facility]

## 2019-05-18 NOTE — Progress Notes (Signed)
ANTICOAGULATION CONSULT NOTE -   Pharmacy Consult for warfarin Indication: atrial fibrillation  Allergies  Allergen Reactions  . Cefzil [Cefprozil] Nausea And Vomiting  . Demerol Nausea And Vomiting    Patient Measurements: Height: 5\' 1"  (154.9 cm) Weight: 218 lb 4.1 oz (99 kg) IBW/kg (Calculated) : 47.8   Vital Signs: Temp: 98.3 F (36.8 C) (11/24 0455) Temp Source: Oral (11/23 2127) BP: 133/63 (11/24 0455) Pulse Rate: 65 (11/24 0455)  Labs: Recent Labs    05/15/19 1443 05/15/19 1445 05/16/19 0905 05/18/19 0624  HGB 11.8*  --  13.2  --   HCT 39.7  --  43.7  --   PLT 279  --  275  --   LABPROT  --  24.1* 19.5* 15.7*  INR  --  2.2* 1.7* 1.3*  CREATININE 0.83  --  0.68 0.89    Estimated Creatinine Clearance: 54.4 mL/min (by C-G formula based on SCr of 0.89 mg/dL).   Medical History: Past Medical History:  Diagnosis Date  . Allergy   . Allergy history unknown   . Arthritis   . ASCVD (arteriosclerotic cardiovascular disease)    stent to mid and proximal left anterior descending in 06/2002;drug eluting stent placed in the second diagnol in 08/2003 after  A  non-st elevation myocardial infarction   . Asthma   . Atrial fibrillation ([place])   . Chronic anticoagulation   . Chronic nausea   . Chronic pain of left knee   . Coronary artery disease   . Current chronic use of systemic steroids 05/11/2016  . Depression   . Diabetes mellitus    no insulin  . Diarrhea    acute  . Diverticulitis of intestine   . FH: colonic polyps    adenomataous  . GERD (gastroesophageal reflux disease)   . History of kidney stones   . Hyperlipidemia    pulmonary embolism 2000 and 09/2008  . Hypertension   . Hypothyroidism   . Insomnia   . Irritable bowel syndrome   . Myocardial infarction ([place])   . Paroxysmal atrial fibrillation ([facility])    normal LV function; episodes occurred in 205 and 09/2007  . Pedal edema   . Peripheral edema   . PONV (postoperative nausea and vomiting)   .  Pulmonary embolism ([place])    2000/09/2008  . Rectal bleeding   . Sleep apnea   . Thyroid disease    hypothyroidism  . Tobacco user    stopped   . Venous stasis     Medications:  Medications Prior to Admission  Medication Sig Dispense Refill Last Dose  . acetaminophen (TYLENOL) 500 MG tablet Take 500 mg by mouth daily as needed for headache.   05/15/2019 at Unknown time  . albuterol (PROVENTIL) (2.5 MG/3ML) 0.083% nebulizer solution Take 3 mLs (2.5 mg total) by nebulization every 6 (six) hours as needed for wheezing or shortness of breath. 75 mL 5 Past Month at Unknown time  . albuterol (VENTOLIN HFA) 108 (90 Base) MCG/ACT inhaler INHALE 2 PUFFS INTO THE LUNGS 4 TIMES DAILY AS NEEDED FOR WHEEZING. 18 g 5 Past Month at Unknown time  . atorvastatin (LIPITOR) 40 MG tablet TAKE ONE TABLET BY MOUTH DAILY. (Patient taking differently: Take 40 mg by mouth every evening. ) 30 tablet 3 05/14/2019 at Unknown time  . Cholecalciferol (VITAMIN D3) 1000 units CAPS Take 1 capsule every evening by mouth.    05/14/2019 at Unknown time  . conjugated estrogens (PREMARIN) vaginal cream Place 1 Applicatorful vaginally  2 (two) times a week. On Mondays and Fridays   05/14/2019 at Unknown time  . diltiazem (CARDIZEM CD) 180 MG 24 hr capsule TAKE ONE CAPSULE BY MOUTH DAILY. (Patient taking differently: Take 180 mg by mouth daily. ) 30 capsule 11 05/15/2019 at Unknown time  . diphenoxylate-atropine (LOMOTIL) 2.5-0.025 MG tablet TAKE ONE TABLET BY MOUTH 2 TIMES A DAY AS NEEDED. (Patient taking differently: Take 1 tablet by mouth every 12 (twelve) hours as needed for diarrhea or loose stools. ) 60 tablet 0 Past Month at Unknown time  . doxycycline (VIBRA-TABS) 100 MG tablet Take 100 mg by mouth 2 (two) times daily. 5 day course starting on 05/10/2019   05/15/2019 at Unknown time  . escitalopram (LEXAPRO) 20 MG tablet TAKE ONE TABLET BY MOUTH ONCE DAILY. (Patient taking differently: Take 20 mg by mouth daily. ) 30 tablet 0  05/15/2019 at Unknown time  . furosemide (LASIX) 40 MG tablet TAKE ONE TABLET BY MOUTH DAILY. (Patient taking differently: Take 20 mg by mouth daily. ) 30 tablet 0 05/14/2019 at Unknown time  . hydrocortisone cream 1 % Apply 1 application topically 2 (two) times daily.   05/15/2019 at Unknown time  . levothyroxine (SYNTHROID, LEVOTHROID) 75 MCG tablet Take 1 tablet (75 mcg total) by mouth daily. 90 tablet 1 05/15/2019 at Unknown time  . losartan (COZAAR) 25 MG tablet TAKE 1 TABLET BY MOUTH ONCE DAILY. (Patient taking differently: Take 25 mg by mouth daily. ) 90 tablet 3 05/15/2019 at Unknown time  . meclizine (ANTIVERT) 25 MG tablet Take 1 tablet (25 mg total) by mouth 3 (three) times daily as needed for dizziness. (Patient taking differently: Take 25 mg by mouth 2 (two) times daily. ) 30 tablet 0 05/15/2019 at Unknown time  . nitroGLYCERIN (NITROLINGUAL) 0.4 MG/SPRAY spray Place 1 spray under the tongue every 5 (five) minutes x 3 doses as needed for chest pain. 12 g 3 unknown  . pantoprazole (PROTONIX) 40 MG tablet Take 1 tablet (40 mg total) by mouth daily. 60 tablet 3 05/15/2019 at Unknown time  . Potassium Chloride ER 20 MEQ TBCR One po daily (Patient taking differently: Take 20 mEq by mouth daily. ) 90 tablet 1 05/15/2019 at Unknown time  . simethicone (MYLICON) 616 MG chewable tablet Chew 125 mg by mouth 3 (three) times daily with meals.   05/15/2019 at Unknown time  . triamcinolone cream (KENALOG) 0.1 % Apply 1 application topically 2 (two) times daily. 60 g 6 05/15/2019 at Unknown time  . warfarin (COUMADIN) 5 MG tablet TAKE ONE TABLET BY MOUTH ONCE DAILY. TAKE AS DIRECTED BY DOCTOR. (Patient taking differently: Take 5 mg by mouth every evening. ) 30 tablet 5 05/14/2019 at 1700    Assessment: Pharmacy consulted to dose warfarin in patient with atrial fibrillation.  Coumadin was held for upper GI endoscopy and ok to restart per GI. Last INR 1.7> 1.3. Vitamin K 2.5 mg PO given on 11/21 Will  give booster dose due to decrease in INR   Home dose listed as 5 mg daily.  Goal of Therapy:  INR 2-3 Monitor platelets by anticoagulation protocol: Yes   Plan:  Warfarin 7.5 mg x 1 dose. Monitor labs and s/s of bleeding.  Isac Sarna, BS [place], [place] Clinical Pharmacist Pager [phone] 05/18/2019 8:37 AM

## 2019-05-19 DIAGNOSIS — K2990 Gastroduodenitis, unspecified, without bleeding: Secondary | ICD-10-CM

## 2019-05-19 DIAGNOSIS — K2970 Gastritis, unspecified, without bleeding: Secondary | ICD-10-CM

## 2019-05-19 DIAGNOSIS — K222 Esophageal obstruction: Secondary | ICD-10-CM

## 2019-05-19 LAB — PROTIME-INR
INR: 1.6 — ABNORMAL HIGH (ref 0.8–1.2)
Prothrombin Time: 18.9 seconds — ABNORMAL HIGH (ref 11.4–15.2)

## 2019-05-19 LAB — BASIC METABOLIC PANEL
Anion gap: 11 (ref 5–15)
BUN: 12 mg/dL (ref 8–23)
CO2: 27 mmol/L (ref 22–32)
Calcium: 8.4 mg/dL — ABNORMAL LOW (ref 8.9–10.3)
Chloride: 108 mmol/L (ref 98–111)
Creatinine, Ser: 0.8 mg/dL (ref 0.44–1.00)
GFR calc Af Amer: >60 mL/min (ref >60)
GFR calc non Af Amer: >60 mL/min (ref >60)
Glucose, Bld: 75 mg/dL (ref 70–99)
Potassium: 3.7 mmol/L (ref 3.5–5.1)
Sodium: 146 mmol/L — ABNORMAL HIGH (ref 135–145)

## 2019-05-19 LAB — MAGNESIUM: Magnesium: 2 mg/dL (ref 1.7–2.4)

## 2019-05-19 LAB — GLUCOSE, CAPILLARY: Glucose-Capillary: 96 mg/dL (ref 70–99)

## 2019-05-19 MED ORDER — WARFARIN SODIUM 5 MG PO TABS
5.0000 mg | ORAL_TABLET | Freq: Once | ORAL | Status: AC
  Administered 2019-05-19: 5 mg via ORAL
  Filled 2019-05-19: qty 1

## 2019-05-19 MED ORDER — PANTOPRAZOLE SODIUM 40 MG PO TBEC
40.0000 mg | DELAYED_RELEASE_TABLET | Freq: Two times a day (BID) | ORAL | Status: DC
  Administered 2019-05-19 – 2019-05-21 (×4): 40 mg via ORAL
  Filled 2019-05-19 (×4): qty 1

## 2019-05-19 MED ORDER — BISACODYL 10 MG RE SUPP
10.0000 mg | Freq: Once | RECTAL | Status: AC
  Administered 2019-05-19: 10 mg via RECTAL
  Filled 2019-05-19: qty 1

## 2019-05-19 MED ORDER — METOCLOPRAMIDE HCL 5 MG/ML IJ SOLN
5.0000 mg | Freq: Three times a day (TID) | INTRAMUSCULAR | Status: DC
  Administered 2019-05-19 – 2019-05-21 (×7): 5 mg via INTRAVENOUS
  Filled 2019-05-19 (×7): qty 2

## 2019-05-19 MED ORDER — POTASSIUM CHLORIDE IN NACL 20-0.45 MEQ/L-% IV SOLN
INTRAVENOUS | Status: AC
  Administered 2019-05-19 – 2019-05-20 (×2): via INTRAVENOUS

## 2019-05-19 NOTE — NC FL2 (Signed)
[place] LEVEL OF CARE SCREENING TOOL     IDENTIFICATION  Patient Name: [name] Birthdate: [birth year] Sex: female Admission Date (Current Location): 05/15/2019  [facility] and [place] Number:  [employer] and Address:  [facility] [address], [place]      Provider Number: [id number]  Attending Physician Name and Address:  Orson Eva, MD  Relative Name and Phone Number:       Current Level of Care: Hospital Recommended Level of Care: [place] Prior Approval Number:    Date Approved/Denied:   PASRR Number:    Discharge Plan: SNF    Current Diagnoses: Patient Active Problem List   Diagnosis Date Noted  . Odynophagia   . Tonsillar abscess 08/13/2018  . AF (paroxysmal atrial fibrillation) ([place]) 08/13/2018  . Nausea vomiting and diarrhea 05/11/2016  . Obesity 05/11/2016  . 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 Insomnia 04/15/2016  . Hyperkalemia 04/08/2016  . Volume depletion 04/08/2016  . AKI (acute kidney injury) ([place]) 04/08/2016  . Depression 04/07/2016  . Nausea without vomiting 05/09/2014  . Melena 05/08/2014  . UGI bleed 05/08/2014  . Dysphagia, unspecified(787.20) 10/26/2013  . Difficulty walking 10/19/2012  . Weakness of left leg 10/19/2012  . Long term current use of anticoagulant therapy 09/26/2012  . Dysphagia 05/04/2012  . Microcytic anemia 08/10/2011  . Acute respiratory failure ([place]) 08/09/2011  . IBS (irritable bowel syndrome) 08/09/2011  . DOE (dyspnea on exertion) 03/05/2011  . Anemia 03/05/2011  . Peripheral neuropathy 10/02/2010  . 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 DIARRHEA, ACUTE 12/05/2008  . Hypothyroidism 12/02/2008  . Controlled type 2 diabetes mellitus ([place]) 12/02/2008  . Hyperlipidemia LDL goal <70 12/02/2008  . Essential hypertension 12/02/2008  . Coronary atherosclerosis 12/02/2008  . Chronic atrial  fibrillation ([place]) 12/02/2008  . Asthma 12/02/2008  . GERD 12/02/2008  . TOBACCO USE, QUIT 12/02/2008    Orientation RESPIRATION BLADDER Height & Weight     Self, Time, Situation, Place  O2(2L) Incontinent Weight: 218 lb 4.1 oz (99 kg) Height:  5\' 1"  (154.9 cm)  BEHAVIORAL SYMPTOMS/MOOD NEUROLOGICAL BOWEL NUTRITION STATUS      Incontinent Diet(see discharge summary)  AMBULATORY STATUS COMMUNICATION OF NEEDS Skin   Limited Assist   Normal                       Personal Care Assistance Level of Assistance  Bathing, Dressing, Feeding Bathing Assistance: Limited assistance Feeding assistance: Independent Dressing Assistance: Limited assistance     Functional Limitations Info  Sight, Hearing, Speech Sight Info: Adequate Hearing Info: Adequate Speech Info: Adequate    SPECIAL CARE FACTORS FREQUENCY                       Contractures Contractures Info: Not present    Additional Factors Info  Code Status, Allergies Code Status Info: DNR Allergies Info: Cefzil, Demerol           Current Medications (05/19/2019):  This is the current hospital active medication list Current Facility-Administered Medications  Medication Dose Route Frequency Provider Last Rate Last Dose  . acetaminophen (TYLENOL) tablet 650 mg  650 mg Oral Q6H PRN Barney Drain L, MD   650 mg at 05/16/19 2044   Or  . acetaminophen (TYLENOL) suppository 650 mg  650 mg Rectal Q6H PRN Danie Binder, MD      .  albuterol (PROVENTIL) (2.5 MG/3ML) 0.083% nebulizer solution 2.5 mg  2.5 mg Nebulization Q6H PRN Fields, Sandi L, MD      . atorvastatin (LIPITOR) tablet 40 mg  40 mg Oral Daily Fields, Sandi L, MD   40 mg at 05/19/19 0904  . diltiazem (CARDIZEM CD) 24 hr capsule 180 mg  180 mg Oral Daily Fields, Sandi L, MD   180 mg at 05/19/19 0904  . escitalopram (LEXAPRO) tablet 20 mg  20 mg Oral Daily Fields, Sandi L, MD   20 mg at 05/19/19 0904  . furosemide (LASIX) tablet 40 mg  40 mg Oral Daily  Fields, Sandi L, MD   40 mg at 05/19/19 0904  . heparin injection 5,000 Units  5,000 Units Subcutaneous Q8H Danie Binder, MD   5,000 Units at 05/19/19 [phone]  . levothyroxine (SYNTHROID) tablet 75 mcg  75 mcg Oral Daily Danie Binder, MD   75 mcg at 05/19/19 [phone]  . lidocaine (XYLOCAINE) 2 % viscous mouth solution 10 mL  10 mL Mouth/Throat TID WC & HS Fields, Sandi L, MD   10 mL at 05/19/19 1232  . lidocaine (XYLOCAINE) 2 % viscous mouth solution 15 mL  15 mL Mouth/Throat Q6H PRN Annitta Needs, NP      . losartan (COZAAR) tablet 25 mg  25 mg Oral Daily Fields, Sandi L, MD   25 mg at 05/19/19 0904  . ondansetron (ZOFRAN) tablet 4 mg  4 mg Oral Q6H PRN Fields, Sandi L, MD       Or  . ondansetron (ZOFRAN) injection 4 mg  4 mg Intravenous Q6H PRN Danie Binder, MD   4 mg at 05/16/19 0937  . oxyCODONE-acetaminophen (PERCOCET) 7.5-325 MG per tablet 1 tablet  1 tablet Oral TID AC Danie Binder, MD   1 tablet at 05/19/19 1232  . pantoprazole (PROTONIX) EC tablet 40 mg  40 mg Oral BID AC Tat, David, MD      . warfarin (COUMADIN) tablet 5 mg  5 mg Oral Once Tat, Shanon Brow, MD      . Warfarin - Pharmacist Dosing Inpatient   Does not apply q1800 Tat, Shanon Brow, MD         Discharge Medications: Please see discharge summary for a list of discharge medications.  Relevant Imaging Results:  Relevant Lab Results:   Additional Information SSN [id number], Clydene Pugh, LCSW

## 2019-05-19 NOTE — Progress Notes (Addendum)
ANTICOAGULATION CONSULT NOTE -   Pharmacy Consult for warfarin Indication: atrial fibrillation  Allergies  Allergen Reactions  . Cefzil [Cefprozil] Nausea And Vomiting  . Demerol Nausea And Vomiting    Patient Measurements: Height: 5\' 1"  (154.9 cm) Weight: 218 lb 4.1 oz (99 kg) IBW/kg (Calculated) : 47.8   Vital Signs: Temp: 98.1 F (36.7 C) (11/25 0515) Temp Source: Oral (11/25 0515) BP: 129/52 (11/25 0515) Pulse Rate: 55 (11/25 0515)  Labs: Recent Labs    05/18/19 0624 05/19/19 0442  LABPROT 15.7* 18.9*  INR 1.3* 1.6*  CREATININE 0.89 0.80    Estimated Creatinine Clearance: 60.5 mL/min (by C-G formula based on SCr of 0.8 mg/dL).   Medical History: Past Medical History:  Diagnosis Date  . Allergy   . Allergy history unknown   . Arthritis   . ASCVD (arteriosclerotic cardiovascular disease)    stent to mid and proximal left anterior descending in 06/2002;drug eluting stent placed in the second diagnol in 08/2003 after  A  non-st elevation myocardial infarction   . Asthma   . Atrial fibrillation ([place])   . Chronic anticoagulation   . Chronic nausea   . Chronic pain of left knee   . Coronary artery disease   . Current chronic use of systemic steroids 05/11/2016  . Depression   . Diabetes mellitus    no insulin  . Diarrhea    acute  . Diverticulitis of intestine   . FH: colonic polyps    adenomataous  . GERD (gastroesophageal reflux disease)   . History of kidney stones   . Hyperlipidemia    pulmonary embolism 2000 and 09/2008  . Hypertension   . Hypothyroidism   . Insomnia   . Irritable bowel syndrome   . Myocardial infarction ([place])   . Paroxysmal atrial fibrillation ([facility])    normal LV function; episodes occurred in 205 and 09/2007  . Pedal edema   . Peripheral edema   . PONV (postoperative nausea and vomiting)   . Pulmonary embolism ([place])    2000/09/2008  . Rectal bleeding   . Sleep apnea   . Thyroid disease    hypothyroidism  . Tobacco user    stopped   . Venous stasis     Medications:  Medications Prior to Admission  Medication Sig Dispense Refill Last Dose  . acetaminophen (TYLENOL) 500 MG tablet Take 500 mg by mouth daily as needed for headache.   05/15/2019 at Unknown time  . albuterol (PROVENTIL) (2.5 MG/3ML) 0.083% nebulizer solution Take 3 mLs (2.5 mg total) by nebulization every 6 (six) hours as needed for wheezing or shortness of breath. 75 mL 5 Past Month at Unknown time  . albuterol (VENTOLIN HFA) 108 (90 Base) MCG/ACT inhaler INHALE 2 PUFFS INTO THE LUNGS 4 TIMES DAILY AS NEEDED FOR WHEEZING. 18 g 5 Past Month at Unknown time  . atorvastatin (LIPITOR) 40 MG tablet TAKE ONE TABLET BY MOUTH DAILY. (Patient taking differently: Take 40 mg by mouth every evening. ) 30 tablet 3 05/14/2019 at Unknown time  . Cholecalciferol (VITAMIN D3) 1000 units CAPS Take 1 capsule every evening by mouth.    05/14/2019 at Unknown time  . conjugated estrogens (PREMARIN) vaginal cream Place 1 Applicatorful vaginally 2 (two) times a week. On Mondays and Fridays   05/14/2019 at Unknown time  . diltiazem (CARDIZEM CD) 180 MG 24 hr capsule TAKE ONE CAPSULE BY MOUTH DAILY. (Patient taking differently: Take 180 mg by mouth daily. ) 30 capsule 11 05/15/2019 at Unknown  time  . diphenoxylate-atropine (LOMOTIL) 2.5-0.025 MG tablet TAKE ONE TABLET BY MOUTH 2 TIMES A DAY AS NEEDED. (Patient taking differently: Take 1 tablet by mouth every 12 (twelve) hours as needed for diarrhea or loose stools. ) 60 tablet 0 Past Month at Unknown time  . doxycycline (VIBRA-TABS) 100 MG tablet Take 100 mg by mouth 2 (two) times daily. 5 day course starting on 05/10/2019   05/15/2019 at Unknown time  . escitalopram (LEXAPRO) 20 MG tablet TAKE ONE TABLET BY MOUTH ONCE DAILY. (Patient taking differently: Take 20 mg by mouth daily. ) 30 tablet 0 05/15/2019 at Unknown time  . furosemide (LASIX) 40 MG tablet TAKE ONE TABLET BY MOUTH DAILY. (Patient taking differently: Take 20 mg by  mouth daily. ) 30 tablet 0 05/14/2019 at Unknown time  . hydrocortisone cream 1 % Apply 1 application topically 2 (two) times daily.   05/15/2019 at Unknown time  . levothyroxine (SYNTHROID, LEVOTHROID) 75 MCG tablet Take 1 tablet (75 mcg total) by mouth daily. 90 tablet 1 05/15/2019 at Unknown time  . losartan (COZAAR) 25 MG tablet TAKE 1 TABLET BY MOUTH ONCE DAILY. (Patient taking differently: Take 25 mg by mouth daily. ) 90 tablet 3 05/15/2019 at Unknown time  . meclizine (ANTIVERT) 25 MG tablet Take 1 tablet (25 mg total) by mouth 3 (three) times daily as needed for dizziness. (Patient taking differently: Take 25 mg by mouth 2 (two) times daily. ) 30 tablet 0 05/15/2019 at Unknown time  . nitroGLYCERIN (NITROLINGUAL) 0.4 MG/SPRAY spray Place 1 spray under the tongue every 5 (five) minutes x 3 doses as needed for chest pain. 12 g 3 unknown  . pantoprazole (PROTONIX) 40 MG tablet Take 1 tablet (40 mg total) by mouth daily. 60 tablet 3 05/15/2019 at Unknown time  . Potassium Chloride ER 20 MEQ TBCR One po daily (Patient taking differently: Take 20 mEq by mouth daily. ) 90 tablet 1 05/15/2019 at Unknown time  . simethicone (MYLICON) 076 MG chewable tablet Chew 125 mg by mouth 3 (three) times daily with meals.   05/15/2019 at Unknown time  . triamcinolone cream (KENALOG) 0.1 % Apply 1 application topically 2 (two) times daily. 60 g 6 05/15/2019 at Unknown time  . warfarin (COUMADIN) 5 MG tablet TAKE ONE TABLET BY MOUTH ONCE DAILY. TAKE AS DIRECTED BY DOCTOR. (Patient taking differently: Take 5 mg by mouth every evening. ) 30 tablet 5 05/14/2019 at 1700    Assessment: Pharmacy consulted to dose warfarin in patient with atrial fibrillation.  Coumadin was held for upper GI endoscopy and ok to restart per GI.  Vitamin K 2.5 mg PO given on 11/21 INR 1.7> 1.3>> 1.6, increasing after booster dose yesterday.   Home dose listed as 5 mg daily.  Goal of Therapy:  INR 2-3 Monitor platelets by anticoagulation  protocol: Yes   Plan:  Warfarin 5 mg x 1 dose. Monitor labs and s/s of bleeding.  Isac Sarna, BS Pharm D, [place] Clinical Pharmacist Pager [phone] 05/19/2019 9:17 AM

## 2019-05-19 NOTE — Plan of Care (Signed)

## 2019-05-19 NOTE — Progress Notes (Signed)
Pt requesting that she have "baby food" instead of regular food. RN notified pt that her food could be pureed to a baby food consistency, but we did not have actual baby food. RN will pass on to day shift RN to get MD/Speech to modify diet, if needed.

## 2019-05-19 NOTE — Clinical Social Work Note (Signed)
[name] at [facility] advised that patient will discharge 05/20/2019. [name] is agreeable to discharge on 05/20/2019.    [name], [name] Pugh, LCSW

## 2019-05-19 NOTE — Progress Notes (Signed)
  Speech Language Pathology Treatment: Dysphagia  Patient Details Name: [name] MRN: [id number] DOB: June 02, 1941 Today's Date: 05/19/2019 Time: 1200-1226 SLP Time Calculation (min) (ACUTE ONLY): 26 min  Assessment / Plan / Recommendation Clinical Impression  Pt seen in room with lunch meal. She was seated upright in chair, however she continues to regurgitate even small amounts of po intake. Swallow appears palpable and no suggestions of decreased airway protection. Pt consume a few bites of pears and green beans and eventually regurgitated both mixed with clear mucous. SLP inquired as to when Pt last had a bowl movement and Pt unable to recall, but says she has not had one in the hospital. She reports epigastric pain and discomfort near sternum. SLP d/w RN, Quillian Quince, who will find out when Pt last had BM as this could be contributing to epigastric pain and esophageal symptoms? Pt has been taking narcotics for fibromyalgia, PPI, and liquid lidocaine per RN. SLP will continue to follow, however her complaints appear more esophageal.      HPI HPI: 78 year old female with a history of paroxysmal atrial fibrillation, diabetes mellitus type 2, hypertension, dysphagia, pulmonary embolus, hypothyroidism, and esophageal dysmotility presenting with 1 week history of dysphagia and intermittent odynophagia. This has resulted in intermittent nausea and vomiting. Patient is a challenging historian, but in general she states that she had worsening of her dysphagia resulting in vomiting after eating oatmeal on 05/14/2019. She states that her symptoms have worsened over the past week. Upon questioning specifically, it appears that her dysphagia difficulties are more with thicker type foods, and not particularly with thin liquids. It appears that her odynophagia has been intermittent, but she also states that she has chest wall pain with certain types of bodily movements. She denies any fevers, chills, shortness  of breath, coughing, hemoptysis, diarrhea, dysuria, hematuria. Notably, the patient had an EGD on 03/08/2016 which did not show any endoscopic esophageal abnormalities to explain her dysphagia. On 05/04/2018, she had a barium swallow study which showed moderate diffuse impaired esophageal motility with mild relative narrowing of her GE junction. Pt had EGD yesterday which showed  benign-appearing intrinsic moderate stenosis s/p dilation. Felt odynophagia due to esophagitis and dysphagia due to benign-appearing esophageal stricture. She had left tonsillar abcess last February. 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      SLP Plan  Continue with current plan of care       Recommendations  Diet recommendations: Dysphagia 3 (mechanical soft);Thin liquid Liquids provided via: Cup;Straw Medication Administration: Whole meds with liquid Supervision: Patient able to self feed Compensations: Slow rate;Small sips/bites Postural Changes and/or Swallow Maneuvers: Seated upright 90 degrees;Upright 30-60 min after meal                Oral Care Recommendations: Oral care BID;Staff/trained caregiver to provide oral care Follow up Recommendations: None SLP Visit Diagnosis: Dysphagia, unspecified (R13.10) Plan: Continue with current plan of care       Thank you,  [name], [place]                [place] 05/19/2019, 1:17 PM

## 2019-05-19 NOTE — Progress Notes (Signed)
PROGRESS NOTE  [name]:[id number] DOB: [birth year] DOA: 05/15/2019 PCP: Mikey Kirschner, MD  Brief History: 78 year old female with a history of paroxysmal atrial fibrillation, diabetes mellitus type 2, hypertension, dysphagia, pulmonary embolus, hypothyroidism, and esophageal dysmotility presenting with 1 week history of dysphagia and intermittent odynophagia. This has resulted in intermittent nausea and vomiting. Patient is a challenging historian, but in general she states that she had worsening of her dysphagia resulting in vomiting after eating oatmeal on 05/14/2019. She states that her symptoms have worsened over the past week. Upon questioning specifically, it appears that her dysphagia difficulties are more with thicker type foods, and not particularly with thin liquids. It appears that her odynophagia has been intermittent, but she also states that she has chest wall pain with certain types of bodily movements. She denies any fevers, chills, shortness of breath, coughing, hemoptysis, diarrhea, dysuria, hematuria. Notably, the patient had an EGD on 03/08/2016 which did not show any endoscopic esophageal abnormalities to explain her dysphagia. On 05/04/2018, she had a barium swallow study which showed moderate diffuse impaired esophageal motility with mild relative narrowing of her GE junction. In the emergency department, the patient was afebrile hemodynamically stable saturating 95% on room air. BMP and CBC were essentially unremarkable. CT of the chest showed dilatation of the main pulmonary artery to 3.8 cm and chronic thoracic spine compression fractures. CT of the neck showed no acute inflammatory changes but showed cervical spondylosis C5-6.  Assessment/Plan: Dysphagia/odynophagia -GI consultappreciated -05/16/19 EGD--odynophagia due to esophagitis; benign appearing esophageal stricture; dilated -05/15/2019 CT neck--no acute inflammatory  changes -Notably, the patient was recently on a course of doxycycline approximately 1 week prior to this admission--this may have contributed to some dysphagia/odynophagia -still having dysphagia and odynophagia symptoms with difficulty tolerating full liquids but a little better -Continue PPI--increase to bid x one month, then daily -speech therapy eval--dys 1 diet if no improvement -down grade to dys 3 diet -up in chair with all meals -add reglan -may need to conside PEG if no improvement in dysphagia/odynophagia--interestingly, she is able to tolerate pills  Chest wall pain/atypical chest pain -Personally reviewed EKG--sinus rhythm, nonspecific T wave change -05/15/2019 CT chest--no lung masses, no consolidation, no edema -Personally reviewed chest x-ray--no edema or consolidation -Personally reviewed EKG--atrial fibrillation, nonspecific T wave change  Paroxysmal atrialfibrillation -Rate controlled -Continue diltiazem CD 180 mg daily -restart warfarin  History of PE -restart warfarin   Essential hypertension -Continue losartan and diltiazem CD  Hyperlipidemia -Continue statin  Hypothyroidism -Continue Synthroid         Disposition Plan:[place] 7-4JOINOM able to tolerate diet Family Communication:NoFamily at bedside  Consultants:none  Code Status: DNR  DVT Prophylaxis: [place] Heparin    Procedures: As Listed in Progress Note Above  Antibiotics: None    Subjective: Pt continues to have "regurgitation" of liquids and solids.  Still having odynophagia, but a little better.  Denies f/c, cp, sob, n/v/d, abd pain  Objective: Vitals:   05/18/19 0455 05/18/19 1518 05/18/19 2145 05/19/19 0515  BP: 133/63 (!) 101/48 (!) 109/57 (!) 129/52  Pulse: 65 (!) 57 (!) 57 (!) 55  Resp: 20 16 20 20   Temp: 98.3 F (36.8 C) 98.3 F (36.8 C) 98.1 F (36.7 C) 98.1 F (36.7 C)  TempSrc:  Oral Oral Oral  SpO2: 100% 94% 96% 94%   Weight:      Height:        Intake/Output Summary (Last 24 hours)  at 05/19/2019 1733 Last data filed at 05/19/2019 1637 Gross per 24 hour  Intake 792.66 ml  Output 1000 ml  Net -207.34 ml   Weight change:  Exam:   General:  Pt is alert, follows commands appropriately, not in acute distress  HEENT: No icterus, No thrush, No neck mass, [place]/AT  Cardiovascular: RRR, S1/S2, no rubs, no gallops  Respiratory: bibasilar rales. No wheeze  Abdomen: Soft/+BS, non tender, non distended, no guarding  Extremities: No edema, No lymphangitis, No petechiae, No rashes, no synovitis   Data Reviewed: I have personally reviewed following labs and imaging studies Basic Metabolic Panel: Recent Labs  Lab 05/15/19 1443 05/16/19 0905 05/18/19 0624 05/19/19 0442  NA 142 142 143 146*  K 4.7 4.1 3.5 3.7  CL 103 104 109 108  CO2 30 28 24 27   GLUCOSE 92 79 85 75  BUN 17 16 13 12   CREATININE 0.83 0.68 0.89 0.80  CALCIUM 9.1 9.1 8.3* 8.4*  MG  --   --  1.6* 2.0   Liver Function Tests: Recent Labs  Lab 05/16/19 0905  AST 17  ALT 12  ALKPHOS 69  BILITOT 0.9  PROT 7.0  ALBUMIN 3.4*   No results for input(s): LIPASE, AMYLASE in the last 168 hours. No results for input(s): AMMONIA in the last 168 hours. Coagulation Profile: Recent Labs  Lab 05/15/19 1445 05/16/19 0905 05/18/19 0624 05/19/19 0442  INR 2.2* 1.7* 1.3* 1.6*   CBC: Recent Labs  Lab 05/15/19 1443 05/16/19 0905  WBC 8.7 9.0  NEUTROABS 6.1  --   HGB 11.8* 13.2  HCT 39.7 43.7  MCV 94.3 92.8  PLT 279 275   Cardiac Enzymes: No results for input(s): CKTOTAL, CKMB, CKMBINDEX, TROPONINI in the last 168 hours. BNP: Invalid input(s): POCBNP CBG: Recent Labs  Lab 05/17/19 2124 05/18/19 0754 05/18/19 1132 05/18/19 1635 05/18/19 2140  GLUCAP 175* 89 107* 96 107*   HbA1C: No results for input(s): HGBA1C in the last 72 hours. Urine analysis:    Component Value Date/Time   COLORURINE YELLOW 04/24/2018 1020    APPEARANCEUR HAZY (A) 04/24/2018 1020   LABSPEC 1.029 04/24/2018 1020   PHURINE 6.0 04/24/2018 1020   GLUCOSEU NEGATIVE 04/24/2018 1020   HGBUR SMALL (A) 04/24/2018 1020   BILIRUBINUR NEGATIVE 04/24/2018 1020   KETONESUR NEGATIVE 04/24/2018 1020   PROTEINUR NEGATIVE 04/24/2018 1020   UROBILINOGEN 0.2 05/08/2014 2240   NITRITE NEGATIVE 04/24/2018 1020   LEUKOCYTESUR LARGE (A) 04/24/2018 1020   Sepsis Labs: @LABRCNTIP (procalcitonin:4,lacticidven:4) ) Recent Results (from the past 240 hour(s))  SARS CORONAVIRUS 2 ([place] 6-24 HRS) Nasopharyngeal Nasopharyngeal Swab     Status: None   Collection Time: 05/15/19  9:30 PM   Specimen: Nasopharyngeal Swab  Result Value Ref Range Status   SARS Coronavirus 2 NEGATIVE NEGATIVE Final    Comment: (NOTE) SARS-CoV-2 target nucleic acids are NOT DETECTED. The SARS-CoV-2 RNA is generally detectable in upper and lower respiratory specimens during the acute phase of infection. Negative results do not preclude SARS-CoV-2 infection, do not rule out co-infections with other pathogens, and should not be used as the sole basis for treatment or other patient management decisions. Negative results must be combined with clinical observations, patient history, and epidemiological information. The expected result is Negative. Fact Sheet for Patients: SugarRoll.be Fact Sheet for Healthcare Providers: https://www.woods-mathews.com/ This test is not yet approved or cleared by the [place] FDA and  has been authorized for detection and/or diagnosis of SARS-CoV-2 by FDA under an Emergency  Use Authorization (EUA). This EUA will remain  in effect (meaning this test can be used) for the duration of the COVID-19 declaration under Section 56 4(b)(1) of the Act, 21 U.S.C. section 360bbb-3(b)(1), unless the authorization is terminated or revoked sooner. Performed at [facility], [place] [address] [place],  [place] [address]   MRSA PCR Screening     Status: None   Collection Time: 05/16/19  4:58 AM   Specimen: Nasopharyngeal  Result Value Ref Range Status   MRSA by PCR NEGATIVE NEGATIVE Final    Comment:        The GeneXpert MRSA Assay (FDA approved for NASAL specimens only), is one component of a comprehensive MRSA colonization surveillance program. It is not intended to diagnose MRSA infection nor to guide or monitor treatment for MRSA infections. Performed at [facility], [address], [place], [place] [address]   SARS Coronavirus 2 by RT PCR (hospital order, performed in [place] hospital lab)     Status: None   Collection Time: 05/16/19  1:00 PM  Result Value Ref Range Status   SARS Coronavirus 2 NEGATIVE NEGATIVE Final    Comment: (NOTE) If result is NEGATIVE SARS-CoV-2 target nucleic acids are NOT DETECTED. The SARS-CoV-2 RNA is generally detectable in upper and lower  respiratory specimens during the acute phase of infection. The lowest  concentration of SARS-CoV-2 viral copies this assay can detect is 250  copies / mL. A negative result does not preclude SARS-CoV-2 infection  and should not be used as the sole basis for treatment or other  patient management decisions.  A negative result may occur with  improper specimen collection / handling, submission of specimen other  than nasopharyngeal swab, presence of viral mutation(s) within the  areas targeted by this assay, and inadequate number of viral copies  (<250 copies / mL). A negative result must be combined with clinical  observations, patient history, and epidemiological information. If result is POSITIVE SARS-CoV-2 target nucleic acids are DETECTED. The SARS-CoV-2 RNA is generally detectable in upper and lower  respiratory specimens dur ing the acute phase of infection.  Positive  results are indicative of active infection with SARS-CoV-2.  Clinical  correlation with patient history and other diagnostic information  is  necessary to determine patient infection status.  Positive results do  not rule out bacterial infection or co-infection with other viruses. If result is PRESUMPTIVE POSTIVE SARS-CoV-2 nucleic acids MAY BE PRESENT.   A presumptive positive result was obtained on the submitted specimen  and confirmed on repeat testing.  While 2019 novel coronavirus  (SARS-CoV-2) nucleic acids may be present in the submitted sample  additional confirmatory testing may be necessary for epidemiological  and / or clinical management purposes  to differentiate between  SARS-CoV-2 and other Sarbecovirus currently known to infect humans.  If clinically indicated additional testing with an alternate test  methodology [phone]) is advised. The SARS-CoV-2 RNA is generally  detectable in upper and lower respiratory sp ecimens during the acute  phase of infection. The expected result is Negative. Fact Sheet for Patients:  StrictlyIdeas.no Fact Sheet for Healthcare Providers: BankingDealers.co.za This test is not yet approved or cleared by the [place] FDA and has been authorized for detection and/or diagnosis of SARS-CoV-2 by FDA under an Emergency Use Authorization (EUA).  This EUA will remain in effect (meaning this test can be used) for the duration of the COVID-19 declaration under Section 564(b)(1) of the Act, 21 U.S.C. section 360bbb-3(b)(1), unless  the authorization is terminated or revoked sooner. Performed at [facility], [address] [place], [place] [address]      Scheduled Meds:  atorvastatin  40 mg Oral Daily   diltiazem  180 mg Oral Daily   escitalopram  20 mg Oral Daily   heparin  5,000 Units Subcutaneous Q8H   levothyroxine  75 mcg Oral Daily   lidocaine  10 mL Mouth/Throat TID WC & HS   losartan  25 mg Oral Daily   metoCLOPramide (REGLAN) injection  5 mg Intravenous TID AC & HS   oxyCODONE-acetaminophen  1 tablet Oral TID AC    pantoprazole  40 mg Oral BID AC   Warfarin - Pharmacist Dosing Inpatient   Does not apply q1800   Continuous Infusions:  0.45 % NaCl with KCl 20 mEq / L 75 mL/hr at 05/19/19 1526    Procedures/Studies: Dg Chest 2 View  Result Date: 05/15/2019 CLINICAL DATA:  Chest pain EXAM: CHEST - 2 VIEW COMPARISON:  04/25/2018 FINDINGS: The heart size and mediastinal contours are enlarged, stable. Calcific aortic knob. No focal airspace consolidation, pleural effusion, or pneumothorax. IMPRESSION: No active cardiopulmonary disease. Electronically Signed   By: Davina Poke M.D.   On: 05/15/2019 15:09   Ct Soft Tissue Neck W Contrast  Result Date: 05/15/2019 CLINICAL DATA:  Initial evaluation for acute pain with swallowing since yesterday. EXAM: CT NECK WITH CONTRAST TECHNIQUE: Multidetector CT imaging of the neck was performed using the standard protocol following the bolus administration of intravenous contrast. CONTRAST:  23mL OMNIPAQUE IOHEXOL 300 MG/ML  SOLN COMPARISON:  None available. FINDINGS: Pharynx and larynx: Oral cavity within normal limits without discrete mass or collection. No acute inflammatory changes seen about the dentition. [place] tonsils symmetric and within normal limits. Parapharyngeal fat maintained. Remainder of the oropharynx and visualized nasopharynx within normal limits. No retropharyngeal collection. Epiglottis within normal limits. Vallecula clear. Remainder of the hypopharynx and supraglottic larynx within normal limits without appreciable swelling or inflammatory changes. True cords are opposed and not well assessed, although grossly symmetric and within normal limits. Subglottic airway clear. Salivary glands: Salivary glands including the parotid and submandibular glands are within normal limits. No sialolithiasis or acute sialoadenitis. Thyroid: Thyroid within normal limits. Lymph nodes: No pathologically enlarged or concerning adenopathy seen within the neck.  Vascular: Moderate atherosclerotic change present about the aortic arch and carotid bifurcations. Associated stenosis of up to approximately 60% noted at the origin of the left ICA. Right carotid artery system medialized into the retropharyngeal space. Limited intracranial: Unremarkable. Visualized orbits: Globes and orbital soft tissues not visualized on this exam. Mastoids and visualized paranasal sinuses: Visualized maxillary sinuses are clear. Mastoid air cells and middle ear cavities not included on this exam. Skeleton: No acute osseous abnormality. Sclerotic lesion within the C6 vertebral body most characteristic of a benign bone island. No other discrete or worrisome osseous lesions. Moderate cervical spondylosis noted at C5-6. Moderate Upper chest: Visualized upper chest demonstrates no acute finding. Other: None. IMPRESSION: 1. Negative CT of the neck. No acute inflammatory changes or other abnormality identified. 2. Aortic Atherosclerosis (ICD10-I70.0). 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 3. Moderate cervical spondylosis at C5-6. Electronically Signed   By: Jeannine Boga M.D.   On: 05/15/2019 21:34   Ct Chest Wo Contrast  Result Date: 05/15/2019 CLINICAL DATA:  Unexplained dysphagia. EXAM: CT CHEST WITHOUT CONTRAST TECHNIQUE: Multidetector CT imaging of the chest was performed following the standard protocol without IV contrast. COMPARISON:  None. FINDINGS: Cardiovascular: The heart size is relatively normal. Aortic calcifications are noted. The main pulmonary artery is significantly dilated measuring 3.8 cm in diameter. Mediastinum/Nodes: --No mediastinal or hilar lymphadenopathy. --No axillary lymphadenopathy. --No supraclavicular lymphadenopathy. --Normal thyroid gland. --The esophagus is unremarkable Lungs/Pleura: No pulmonary nodules or masses. No pleural effusion or pneumothorax. No focal airspace consolidation. No focal pleural abnormality. Upper Abdomen:  The patient is status post cholecystectomy. Musculoskeletal: There are multilevel compression fractures throughout the thoracic spine, all of which appear to be chronic. There is a calcified left breast mass, presumably benign. Review of the MIP images confirms the above findings. IMPRESSION: 1. No acute cardiopulmonary process. 2. The main pulmonary artery is significantly dilated measuring 3.8 cm in diameter. This can be seen with pulmonary arterial hypertension. Aortic Atherosclerosis (ICD10-I70.0). Electronically Signed   By: Constance Holster M.D.   On: 05/15/2019 21:10    Orson Eva, DO  [facility] Pager [phone]  If 7PM-7AM, please contact night-coverage [email or web address] Password [facility] 05/19/2019, 5:33 PM   LOS: 2 days

## 2019-05-19 NOTE — Progress Notes (Signed)
Subjective: Feels like swallowing continues to get better. Spoke with SLP today and feels "being slumped in the bed" could be worsening her situation. Helped CNA staff get patient OOB to chair for lunch to see if this helps. Patient is agreeable to eating in the chair. Denies abdominal pain other than mild epigastric soreness. Denies N/V. Food going down better, but still not 100%. Some persistent odynophagia. No other GI complaints.  Objective: Vital signs in last 24 hours: Temp:  [98.1 F (36.7 C)-98.3 F (36.8 C)] 98.1 F (36.7 C) (11/25 0515) Pulse Rate:  [55-57] 55 (11/25 0515) Resp:  [16-20] 20 (11/25 0515) BP: (101-129)/(48-57) 129/52 (11/25 0515) SpO2:  [94 %-96 %] 94 % (11/25 0515) Last BM Date: 05/17/19 General:   Alert and oriented, pleasant. Obese female. Head:  Normocephalic and atraumatic. Eyes:  No icterus, sclera clear. Conjuctiva pink.  Neck:  Supple, without thyromegaly or masses.  Heart:  S1, S2 present, no murmurs noted.  Lungs: Clear to auscultation bilaterally, without wheezing, rales, or rhonchi.  Abdomen:  Bowel sounds present, soft, non-distended. Mild epigastric soreness. No HSM or hernias noted. No rebound or guarding. No masses appreciated  Msk:  Symmetrical without gross deformities. Pulses:  Normal bilateral DP pulses noted. Extremities:  Without clubbing or edema. Neurologic:  Alert and  oriented x4;  grossly normal neurologically. Psych:  Alert and cooperative. Normal mood and affect.  Intake/Output from previous day: 11/24 0701 - 11/25 0700 In: 2113.8 [P.O.:580; I.V.:1533.8] Out: 1650 [Urine:1650] Intake/Output this shift: No intake/output data recorded.  Lab Results: Recent Labs    05/16/19 0905  WBC 9.0  HGB 13.2  HCT 43.7  PLT 275   BMET Recent Labs    05/16/19 0905 05/18/19 0624 05/19/19 0442  NA 142 143 146*  K 4.1 3.5 3.7  CL 104 109 108  CO2 28 24 27   GLUCOSE 79 85 75  BUN 16 13 12   CREATININE 0.68 0.89 0.80   CALCIUM 9.1 8.3* 8.4*   LFT Recent Labs    05/16/19 0905  PROT 7.0  ALBUMIN 3.4*  AST 17  ALT 12  ALKPHOS 69  BILITOT 0.9   PT/INR Recent Labs    05/18/19 0624 05/19/19 0442  LABPROT 15.7* 18.9*  INR 1.3* 1.6*   Hepatitis Panel No results for input(s): HEPBSAG, HCVAB, HEPAIGM, HEPBIGM in the last 72 hours.   Studies/Results: No results found.  Assessment: 78 year old female admitted with odynophagia and dysphagia on 05/15/2019 who underwent EGD on 05/16/2019 noting benign-appearing intrinsic moderate stenosis s/p dilation.  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  Plans to treat with PPI twice daily x1 month then daily forever.  She continued with odynophagia on 05/17/2019 and viscous lidocaine was added and Percocet was increased.  Patient notes some improvement with dysphagia and odynophagia over the past couple days. Ate dinner well last night.  She has known esophageal dysmotility noted on BPE in November 2019.  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 Suspect these are likely playing a role as well.  Will plan to continue her current therapy with PPI twice daily and viscous lidocaine. Spoke with speech today who feels out of bed to chair for meals will help her sit straight up and improve her swallowing.  Plan: 1. Continued PPI bid x 1 month then qd forever 2. Continued viscous lidocaine 3. OOB to chair for all meals 4. Supportive measures   Thank  you for allowing [place] to participate in the care of [name] P Cindi Carbon, DNP, AGNP-C Adult & Gerontological Nurse Practitioner [facility]     LOS: 2 days    05/19/2019, 8:05 AM

## 2019-05-20 LAB — PROTIME-INR
INR: 1.8 — ABNORMAL HIGH (ref 0.8–1.2)
Prothrombin Time: 20.7 seconds — ABNORMAL HIGH (ref 11.4–15.2)

## 2019-05-20 LAB — CBC
HCT: 38.3 % (ref 36.0–46.0)
Hemoglobin: 11.3 g/dL — ABNORMAL LOW (ref 12.0–15.0)
MCH: 27.5 pg (ref 26.0–34.0)
MCHC: 29.5 g/dL — ABNORMAL LOW (ref 30.0–36.0)
MCV: 93.2 fL (ref 80.0–100.0)
Platelets: 241 10*3/uL (ref 150–400)
RBC: 4.11 MIL/uL (ref 3.87–5.11)
RDW: 14.3 % (ref 11.5–15.5)
WBC: 5.8 10*3/uL (ref 4.0–10.5)
nRBC: 0 % (ref 0.0–0.2)

## 2019-05-20 LAB — BASIC METABOLIC PANEL
Anion gap: 10 (ref 5–15)
BUN: 12 mg/dL (ref 8–23)
CO2: 30 mmol/L (ref 22–32)
Calcium: 8.7 mg/dL — ABNORMAL LOW (ref 8.9–10.3)
Chloride: 103 mmol/L (ref 98–111)
Creatinine, Ser: 0.81 mg/dL (ref 0.44–1.00)
GFR calc Af Amer: >60 mL/min (ref >60)
GFR calc non Af Amer: >60 mL/min (ref >60)
Glucose, Bld: 81 mg/dL (ref 70–99)
Potassium: 3.2 mmol/L — ABNORMAL LOW (ref 3.5–5.1)
Sodium: 143 mmol/L (ref 135–145)

## 2019-05-20 MED ORDER — DILTIAZEM HCL ER COATED BEADS 120 MG PO CP24
120.0000 mg | ORAL_CAPSULE | Freq: Every day | ORAL | Status: DC
  Administered 2019-05-21: 120 mg via ORAL
  Filled 2019-05-20: qty 1

## 2019-05-20 MED ORDER — PANTOPRAZOLE SODIUM 40 MG PO TBEC: 40.0000 mg | DELAYED_RELEASE_TABLET | Freq: Two times a day (BID) | ORAL | 0 refills | Status: DC

## 2019-05-20 MED ORDER — ALBUMIN HUMAN 25 % IV SOLN
12.5000 g | Freq: Once | INTRAVENOUS | Status: AC
  Administered 2019-05-20: 12.5 g via INTRAVENOUS
  Filled 2019-05-20: qty 50

## 2019-05-20 MED ORDER — POTASSIUM CHLORIDE 10 MEQ/100ML IV SOLN
10.0000 meq | INTRAVENOUS | Status: AC
  Administered 2019-05-20 (×3): 10 meq via INTRAVENOUS
  Filled 2019-05-20 (×3): qty 100

## 2019-05-20 MED ORDER — LIDOCAINE VISCOUS HCL 2 % MT SOLN: 15.0000 mL | Freq: Four times a day (QID) | OROMUCOSAL | 0 refills | Status: DC | PRN

## 2019-05-20 MED ORDER — SODIUM CHLORIDE 0.9 % IV BOLUS
500.0000 mL | Freq: Once | INTRAVENOUS | Status: AC
  Administered 2019-05-20: 500 mL via INTRAVENOUS

## 2019-05-20 MED ORDER — LIDOCAINE VISCOUS HCL 2 % MT SOLN: 10.0000 mL | Freq: Three times a day (TID) | OROMUCOSAL | 0 refills | Status: DC

## 2019-05-20 MED ORDER — WARFARIN SODIUM 5 MG PO TABS
5.0000 mg | ORAL_TABLET | Freq: Once | ORAL | Status: AC
  Administered 2019-05-20: 5 mg via ORAL
  Filled 2019-05-20: qty 1

## 2019-05-20 MED ORDER — OXYCODONE-ACETAMINOPHEN 7.5-325 MG PO TABS: 1.0000 | ORAL_TABLET | Freq: Three times a day (TID) | ORAL | 0 refills | Status: DC

## 2019-05-20 NOTE — Plan of Care (Signed)

## 2019-05-20 NOTE — Plan of Care (Signed)

## 2019-05-20 NOTE — Progress Notes (Signed)
PROGRESS NOTE  [name] RWE:[id number] DOB: Nov 06, 1940 DOA: 05/15/2019 PCP: Mikey Kirschner, MD  Brief History: 78 year old female with a history of paroxysmal atrial fibrillation, diabetes mellitus type 2, hypertension, dysphagia, pulmonary embolus, hypothyroidism, and esophageal dysmotility presenting with 1 week history of dysphagia and intermittent odynophagia. This has resulted in intermittent nausea and vomiting. Patient is a challenging historian, but in general she states that she had worsening of her dysphagia resulting in vomiting after eating oatmeal on 05/14/2019. She states that her symptoms have worsened over the past week. Upon questioning specifically, it appears that her dysphagia difficulties are more with thicker type foods, and not particularly with thin liquids. It appears that her odynophagia has been intermittent, but she also states that she has chest wall pain with certain types of bodily movements. She denies any fevers, chills, shortness of breath, coughing, hemoptysis, diarrhea, dysuria, hematuria. Notably, the patient had an EGD on 03/08/2016 which did not show any endoscopic esophageal abnormalities to explain her dysphagia. On 05/04/2018, she had a barium swallow study which showed moderate diffuse impaired esophageal motility with mild relative narrowing of her GE junction. In the emergency department, the patient was afebrile hemodynamically stable saturating 95% on room air. BMP and CBC were essentially unremarkable. CT of the chest showed dilatation of the main pulmonary artery to 3.8 cm and chronic thoracic spine compression fractures. CT of the neck showed no acute inflammatory changes but showed cervical spondylosis C5-6.  Assessment/Plan: Dysphagia/odynophagia -GI consultappreciated -05/16/19 EGD--odynophagia due to esophagitis; benign appearing esophageal stricture; dilated -05/15/2019 CT neck--no acute inflammatory  changes -Notably, the patient was recently on a course of doxycycline approximately 1 week prior to this admission--this may have contributed to some dysphagia/odynophagia -still having dysphagia and odynophagia symptoms with difficulty tolerating full liquidsbut a little better -Continue PPI--increase to bid x one month, then daily -speech therapy eval--dys 1 diet if no improvement -down grade to dys 3 diet -up in chair with all meals -added reglan -may need to conside PEG if no improvement in dysphagia/odynophagia--interestingly, she is able to tolerate pills -11/26--pt states no regurgitation, pain with swallowing a little better  Chest wall pain/atypical chest pain -Personally reviewed EKG--sinus rhythm, nonspecific T wave change -05/15/2019 CT chest--no lung masses, no consolidation, no edema -Personally reviewed chest x-ray--no edema or consolidation -Personally reviewed EKG--atrial fibrillation, nonspecific T wave change  Paroxysmal atrialfibrillation -Rate controlled -Continue diltiazem CD--decrease to 120 mg due to bradycardia and soft BPs -restarted warfarin  History of PE -restart warfarin   Essential hypertension -d/c losartan due to soft BP -decrease diltiazem CD due to soft BP -personally checked vitals at 1430--HR 54-RR16-112/52--96%RA  Hyperlipidemia -Continue statin  Hypothyroidism -Continue Synthroid         Disposition Plan:[place] 61/95KD able to tolerate diet Family Communication:NoFamily at bedside  Consultants:none  Code Status: DNR  DVT Prophylaxis: [place] Heparin    Procedures: As Listed in Progress Note Above  Antibiotics: None      Subjective: Pt states odynophagia is a little better.  Denies f/c, sob, n/v/d, abd pain.  No regurgitation today.  She tolerated breakfast and lunch sitting up  Objective: Vitals:   05/19/19 2126 05/20/19 0500 05/20/19 0632 05/20/19 1331  BP: (!) 103/49  (!)  148/64 91/65  Pulse: (!) 50  (!) 49 (!) 54  Resp: 20  18 16   Temp: 97.9 F (36.6 C)  97.7 F (36.5 C) 97.6 F (36.4 C)  TempSrc: Oral  Oral Oral  SpO2: 96%  100% 100%  Weight:  106.2 kg    Height:        Intake/Output Summary (Last 24 hours) at 05/20/2019 1512 Last data filed at 05/20/2019 0300 Gross per 24 hour  Intake 162.63 ml  Output 1500 ml  Net -1337.37 ml   Weight change:  Exam:   General:  Pt is alert, follows commands appropriately, not in acute distress  HEENT: No icterus, No thrush, No neck mass, [place]/AT  Cardiovascular: RRR, S1/S2, no rubs, no gallops  Respiratory: bibasilar rales. No wheeze  Abdomen: Soft/+BS, non tender, non distended, no guarding  Extremities: No edema, No lymphangitis, No petechiae, No rashes, no synovitis   Data Reviewed: I have personally reviewed following labs and imaging studies Basic Metabolic Panel: Recent Labs  Lab 05/15/19 1443 05/16/19 0905 05/18/19 0624 05/19/19 0442 05/20/19 0549  NA 142 142 143 146* 143  K 4.7 4.1 3.5 3.7 3.2*  CL 103 104 109 108 103  CO2 30 28 24 27 30   GLUCOSE 92 79 85 75 81  BUN 17 16 13 12 12   CREATININE 0.83 0.68 0.89 0.80 0.81  CALCIUM 9.1 9.1 8.3* 8.4* 8.7*  MG  --   --  1.6* 2.0  --    Liver Function Tests: Recent Labs  Lab 05/16/19 0905  AST 17  ALT 12  ALKPHOS 69  BILITOT 0.9  PROT 7.0  ALBUMIN 3.4*   No results for input(s): LIPASE, AMYLASE in the last 168 hours. No results for input(s): AMMONIA in the last 168 hours. Coagulation Profile: Recent Labs  Lab 05/15/19 1445 05/16/19 0905 05/18/19 0624 05/19/19 0442 05/20/19 0549  INR 2.2* 1.7* 1.3* 1.6* 1.8*   CBC: Recent Labs  Lab 05/15/19 1443 05/16/19 0905 05/20/19 0549  WBC 8.7 9.0 5.8  NEUTROABS 6.1  --   --   HGB 11.8* 13.2 11.3*  HCT 39.7 43.7 38.3  MCV 94.3 92.8 93.2  PLT 279 275 241   Cardiac Enzymes: No results for input(s): CKTOTAL, CKMB, CKMBINDEX, TROPONINI in the last 168 hours. BNP: Invalid  input(s): POCBNP CBG: Recent Labs  Lab 05/18/19 0754 05/18/19 1132 05/18/19 1635 05/18/19 2140 05/19/19 2125  GLUCAP 89 107* 96 107* 96   HbA1C: No results for input(s): HGBA1C in the last 72 hours. Urine analysis:    Component Value Date/Time   COLORURINE YELLOW 04/24/2018 1020   APPEARANCEUR HAZY (A) 04/24/2018 1020   LABSPEC 1.029 04/24/2018 1020   PHURINE 6.0 04/24/2018 1020   GLUCOSEU NEGATIVE 04/24/2018 1020   HGBUR SMALL (A) 04/24/2018 1020   BILIRUBINUR NEGATIVE 04/24/2018 1020   KETONESUR NEGATIVE 04/24/2018 1020   PROTEINUR NEGATIVE 04/24/2018 1020   UROBILINOGEN 0.2 05/08/2014 2240   NITRITE NEGATIVE 04/24/2018 1020   LEUKOCYTESUR LARGE (A) 04/24/2018 1020   Sepsis Labs: @LABRCNTIP (procalcitonin:4,lacticidven:4) ) Recent Results (from the past 240 hour(s))  SARS CORONAVIRUS 2 (Adama Ferber 6-24 HRS) Nasopharyngeal Nasopharyngeal Swab     Status: None   Collection Time: 05/15/19  9:30 PM   Specimen: Nasopharyngeal Swab  Result Value Ref Range Status   SARS Coronavirus 2 NEGATIVE NEGATIVE Final    Comment: (NOTE) SARS-CoV-2 target nucleic acids are NOT DETECTED. The SARS-CoV-2 RNA is generally detectable in upper and lower respiratory specimens during the acute phase of infection. Negative results do not preclude SARS-CoV-2 infection, do not rule out co-infections with other pathogens, and should not be used as the sole basis for treatment or other patient management decisions. Negative results must  be combined with clinical observations, patient history, and epidemiological information. The expected result is Negative. Fact Sheet for Patients: SugarRoll.be Fact Sheet for Healthcare Providers: https://www.woods-mathews.com/ This test is not yet approved or cleared by the [place] FDA and  has been authorized for detection and/or diagnosis of SARS-CoV-2 by FDA under an Emergency Use Authorization (EUA). This EUA will  remain  in effect (meaning this test can be used) for the duration of the COVID-19 declaration under Section 56 4(b)(1) of the Act, 21 U.S.C. section 360bbb-3(b)(1), unless the authorization is terminated or revoked sooner. Performed at [facility], [place] [address] [place], [place] [address]   MRSA PCR Screening     Status: None   Collection Time: 05/16/19  4:58 AM   Specimen: Nasopharyngeal  Result Value Ref Range Status   MRSA by PCR NEGATIVE NEGATIVE Final    Comment:        The GeneXpert MRSA Assay (FDA approved for NASAL specimens only), is one component of a comprehensive MRSA colonization surveillance program. It is not intended to diagnose MRSA infection nor to guide or monitor treatment for MRSA infections. Performed at [facility], [address], [place], [place] [address]   SARS Coronavirus 2 by RT PCR (hospital order, performed in [place] hospital lab)     Status: None   Collection Time: 05/16/19  1:00 PM  Result Value Ref Range Status   SARS Coronavirus 2 NEGATIVE NEGATIVE Final    Comment: (NOTE) If result is NEGATIVE SARS-CoV-2 target nucleic acids are NOT DETECTED. The SARS-CoV-2 RNA is generally detectable in upper and lower  respiratory specimens during the acute phase of infection. The lowest  concentration of SARS-CoV-2 viral copies this assay can detect is 250  copies / mL. A negative result does not preclude SARS-CoV-2 infection  and should not be used as the sole basis for treatment or other  patient management decisions.  A negative result may occur with  improper specimen collection / handling, submission of specimen other  than nasopharyngeal swab, presence of viral mutation(s) within the  areas targeted by this assay, and inadequate number of viral copies  (<250 copies / mL). A negative result must be combined with clinical  observations, patient history, and epidemiological information. If result is POSITIVE SARS-CoV-2 target  nucleic acids are DETECTED. The SARS-CoV-2 RNA is generally detectable in upper and lower  respiratory specimens dur ing the acute phase of infection.  Positive  results are indicative of active infection with SARS-CoV-2.  Clinical  correlation with patient history and other diagnostic information is  necessary to determine patient infection status.  Positive results do  not rule out bacterial infection or co-infection with other viruses. If result is PRESUMPTIVE POSTIVE SARS-CoV-2 nucleic acids MAY BE PRESENT.   A presumptive positive result was obtained on the submitted specimen  and confirmed on repeat testing.  While 2019 novel coronavirus  (SARS-CoV-2) nucleic acids may be present in the submitted sample  additional confirmatory testing may be necessary for epidemiological  and / or clinical management purposes  to differentiate between  SARS-CoV-2 and other Sarbecovirus currently known to infect humans.  If clinically indicated additional testing with an alternate test  methodology [phone]) is advised. The SARS-CoV-2 RNA is generally  detectable in upper and lower respiratory sp ecimens during the acute  phase of infection. The expected result is Negative. Fact Sheet for Patients:  StrictlyIdeas.no Fact Sheet for Healthcare Providers: BankingDealers.co.za This test is not yet approved or  cleared by the [place] and has been authorized for detection and/or diagnosis of SARS-CoV-2 by FDA under an Emergency Use Authorization (EUA).  This EUA will remain in effect (meaning this test can be used) for the duration of the COVID-19 declaration under Section 564(b)(1) of the Act, 21 U.S.C. section 360bbb-3(b)(1), unless the authorization is terminated or revoked sooner. Performed at [facility], [address] [place], [place] [address]      Scheduled Meds:  atorvastatin  40 mg Oral Daily   diltiazem  180 mg Oral Daily    escitalopram  20 mg Oral Daily   heparin  5,000 Units Subcutaneous Q8H   levothyroxine  75 mcg Oral Daily   lidocaine  10 mL Mouth/Throat TID WC & HS   metoCLOPramide (REGLAN) injection  5 mg Intravenous TID AC & HS   oxyCODONE-acetaminophen  1 tablet Oral TID AC   pantoprazole  40 mg Oral BID AC   warfarin  5 mg Oral Once   Warfarin - Pharmacist Dosing Inpatient   Does not apply q1800   Continuous Infusions:  0.45 % NaCl with KCl 20 mEq / L 75 mL/hr at 05/20/19 2423    Procedures/Studies: Dg Chest 2 View  Result Date: 05/15/2019 CLINICAL DATA:  Chest pain EXAM: CHEST - 2 VIEW COMPARISON:  04/25/2018 FINDINGS: The heart size and mediastinal contours are enlarged, stable. Calcific aortic knob. No focal airspace consolidation, pleural effusion, or pneumothorax. IMPRESSION: No active cardiopulmonary disease. Electronically Signed   By: Davina Poke M.D.   On: 05/15/2019 15:09   Ct Soft Tissue Neck W Contrast  Result Date: 05/15/2019 CLINICAL DATA:  Initial evaluation for acute pain with swallowing since yesterday. EXAM: CT NECK WITH CONTRAST TECHNIQUE: Multidetector CT imaging of the neck was performed using the standard protocol following the bolus administration of intravenous contrast. CONTRAST:  61mL OMNIPAQUE IOHEXOL 300 MG/ML  SOLN COMPARISON:  None available. FINDINGS: Pharynx and larynx: Oral cavity within normal limits without discrete mass or collection. No acute inflammatory changes seen about the dentition. [place] tonsils symmetric and within normal limits. Parapharyngeal fat maintained. Remainder of the oropharynx and visualized nasopharynx within normal limits. No retropharyngeal collection. Epiglottis within normal limits. Vallecula clear. Remainder of the hypopharynx and supraglottic larynx within normal limits without appreciable swelling or inflammatory changes. True cords are opposed and not well assessed, although grossly symmetric and within normal limits.  Subglottic airway clear. Salivary glands: Salivary glands including the parotid and submandibular glands are within normal limits. No sialolithiasis or acute sialoadenitis. Thyroid: Thyroid within normal limits. Lymph nodes: No pathologically enlarged or concerning adenopathy seen within the neck. Vascular: Moderate atherosclerotic change present about the aortic arch and carotid bifurcations. Associated stenosis of up to approximately 60% noted at the origin of the left ICA. Right carotid artery system medialized into the retropharyngeal space. Limited intracranial: Unremarkable. Visualized orbits: Globes and orbital soft tissues not visualized on this exam. Mastoids and visualized paranasal sinuses: Visualized maxillary sinuses are clear. Mastoid air cells and middle ear cavities not included on this exam. Skeleton: No acute osseous abnormality. Sclerotic lesion within the C6 vertebral body most characteristic of a benign bone island. No other discrete or worrisome osseous lesions. Moderate cervical spondylosis noted at C5-6. Moderate Upper chest: Visualized upper chest demonstrates no acute finding. Other: None. IMPRESSION: 1. Negative CT of the neck. No acute inflammatory changes or other abnormality identified. 2. Aortic Atherosclerosis (ICD10-I70.0). 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 3. Moderate  cervical spondylosis at C5-6. Electronically Signed   By: Jeannine Boga M.D.   On: 05/15/2019 21:34   Ct Chest Wo Contrast  Result Date: 05/15/2019 CLINICAL DATA:  Unexplained dysphagia. EXAM: CT CHEST WITHOUT CONTRAST TECHNIQUE: Multidetector CT imaging of the chest was performed following the standard protocol without IV contrast. COMPARISON:  None. FINDINGS: Cardiovascular: The heart size is relatively normal. Aortic calcifications are noted. The main pulmonary artery is significantly dilated measuring 3.8 cm in diameter. Mediastinum/Nodes: --No mediastinal or hilar  lymphadenopathy. --No axillary lymphadenopathy. --No supraclavicular lymphadenopathy. --Normal thyroid gland. --The esophagus is unremarkable Lungs/Pleura: No pulmonary nodules or masses. No pleural effusion or pneumothorax. No focal airspace consolidation. No focal pleural abnormality. Upper Abdomen: The patient is status post cholecystectomy. Musculoskeletal: There are multilevel compression fractures throughout the thoracic spine, all of which appear to be chronic. There is a calcified left breast mass, presumably benign. Review of the MIP images confirms the above findings. IMPRESSION: 1. No acute cardiopulmonary process. 2. The main pulmonary artery is significantly dilated measuring 3.8 cm in diameter. This can be seen with pulmonary arterial hypertension. Aortic Atherosclerosis (ICD10-I70.0). Electronically Signed   By: Constance Holster M.D.   On: 05/15/2019 21:10    Orson Eva, DO  [facility] Pager [phone]  If 7PM-7AM, please contact night-coverage [email or web address] Password [email or web address] 05/20/2019, 3:12 PM   LOS: 3 days

## 2019-05-20 NOTE — Discharge Summary (Signed)
Physician Discharge Summary  [name] TSV:[id number] DOB: [birth year] DOA: 05/15/2019  PCP: Mikey Kirschner, MD  Admit date: 05/15/2019 Discharge date: 05/21/2019  Admitted From: SNF Disposition:  SNF  Recommendations for Outpatient Follow-up:  1. Follow up with PCP in 1-2 weeks 2. Please obtain BMP/CBC in one week 3. Patient must sitting up in chair with all meals;  No meals in bed please     Discharge Condition: Stable CODE STATUS:DNR Diet recommendation: dysphagia 3   Brief/Interim Summary: 78 year old female with a history of paroxysmal atrial fibrillation, diabetes mellitus type 2, hypertension, dysphagia, pulmonary embolus, hypothyroidism, and esophageal dysmotility presenting with 1 week history of dysphagia and intermittent odynophagia. This has resulted in intermittent nausea and vomiting. Patient is a challenging historian, but in general she states that she had worsening of her dysphagia resulting in vomiting after eating oatmeal on 05/14/2019. She states that her symptoms have worsened over the past week. Upon questioning specifically, it appears that her dysphagia difficulties are more with thicker type foods, and not particularly with thin liquids. It appears that her odynophagia has been intermittent, but she also states that she has chest wall pain with certain types of bodily movements. She denies any fevers, chills, shortness of breath, coughing, hemoptysis, diarrhea, dysuria, hematuria. Notably, the patient had an EGD on 03/08/2016 which did not show any endoscopic esophageal abnormalities to explain her dysphagia. On 05/04/2018, she had a barium swallow study which showed moderate diffuse impaired esophageal motility with mild relative narrowing of her GE junction. In the emergency department, the patient was afebrile hemodynamically stable saturating 95% on room air. BMP and CBC were essentially unremarkable. CT of the chest showed dilatation of the main  pulmonary artery to 3.8 cm and chronic thoracic spine compression fractures. CT of the neck showed no acute inflammatory changes but showed cervical spondylosis C5-6. GI was consulted to assist.  05/16/19, patient underwent EGD--see below for details.  PPI was increased to bid and viscous lidocaine with meals was added.  Slowly, her pain improved and she did not "regurgitate" any further once she was made to sit up in chair for each meal and reglan was added.  Discharge Diagnoses:  Dysphagia/odynophagia -GI consultappreciated -05/16/19 EGD--odynophagia due to esophagitis; benign appearing esophageal stricture; dilated -05/15/2019 CT neck--no acute inflammatory changes -Notably, the patient was recently on a course of doxycycline approximately 1 week prior to this admission--this may have contributed to some dysphagia/odynophagia -still having dysphagia and odynophagia symptoms with difficulty tolerating full liquidsbut a little better -Continue PPI--increase to bid x one month, then daily -speech therapy eval--dys 1 diet if no improvement -down grade to dys 3 diet -up in chair with all meals -added reglan -may need to conside PEG if no improvement in dysphagia/odynophagia--interestingly, she is able to tolerate pills -11/26--pt states no regurgitation, pain with swallowing a little better  Chest wall pain/atypical chest pain -Personally reviewed EKG--sinus rhythm, nonspecific T wave change -05/15/2019 CT chest--no lung masses, no consolidation, no edema -Personally reviewed chest x-ray--no edema or consolidation -Personally reviewed EKG--atrial fibrillation, nonspecific T wave change  Paroxysmal atrialfibrillation -Rate controlled -Continue diltiazem CD--decrease to 120 mg due to bradycardia and soft BPs -restarted warfarin  History of PE -restart warfarin   Essential hypertension -d/c losartan due to soft BP -decrease diltiazem CD due to soft BP -personally checked vitals  at 1430--HR 54-RR16-112/52--96%RA  Hyperlipidemia -Continue statin  Hypothyroidism -Continue Synthroid   Discharge Instructions   Allergies as of 05/21/2019      Reactions  Cefzil [cefprozil] Nausea And Vomiting   Demerol Nausea And Vomiting      Medication List    STOP taking these medications   doxycycline 100 MG tablet Commonly known as: VIBRA-TABS   losartan 25 MG tablet Commonly known as: COZAAR     TAKE these medications   acetaminophen 500 MG tablet Commonly known as: TYLENOL Take 500 mg by mouth daily as needed for headache.   albuterol (2.5 MG/3ML) 0.083% nebulizer solution Commonly known as: PROVENTIL Take 3 mLs (2.5 mg total) by nebulization every 6 (six) hours as needed for wheezing or shortness of breath.   albuterol 108 (90 Base) MCG/ACT inhaler Commonly known as: Ventolin HFA INHALE 2 PUFFS INTO THE LUNGS 4 TIMES DAILY AS NEEDED FOR WHEEZING.   atorvastatin 40 MG tablet Commonly known as: LIPITOR TAKE ONE TABLET BY MOUTH DAILY. What changed: when to take this   conjugated estrogens vaginal cream Commonly known as: PREMARIN Place 1 Applicatorful vaginally 2 (two) times a week. On Mondays and Fridays   diltiazem 120 MG 24 hr capsule Commonly known as: CARDIZEM CD Take 1 capsule (120 mg total) by mouth daily. What changed:   medication strength  how much to take   diphenoxylate-atropine 2.5-0.025 MG tablet Commonly known as: LOMOTIL TAKE ONE TABLET BY MOUTH 2 TIMES A DAY AS NEEDED. What changed: See the new instructions.   escitalopram 20 MG tablet Commonly known as: LEXAPRO TAKE ONE TABLET BY MOUTH ONCE DAILY.   furosemide 40 MG tablet Commonly known as: LASIX TAKE ONE TABLET BY MOUTH DAILY. What changed: how much to take   hydrocortisone cream 1 % Apply 1 application topically 2 (two) times daily.   levothyroxine 75 MCG tablet Commonly known as: SYNTHROID Take 1 tablet (75 mcg total) by mouth daily.   lidocaine 2 %  solution Commonly known as: XYLOCAINE Use as directed 10 mLs in the mouth or throat 4 (four) times daily -  with meals and at bedtime.   lidocaine 2 % solution Commonly known as: XYLOCAINE Use as directed 15 mLs in the mouth or throat every 6 (six) hours as needed (odynophagia).   meclizine 25 MG tablet Commonly known as: ANTIVERT Take 1 tablet (25 mg total) by mouth 3 (three) times daily as needed for dizziness. What changed: when to take this   metoCLOPramide 5 MG tablet Commonly known as: Reglan Take 1 tablet (5 mg total) by mouth 3 (three) times daily before meals.   nitroGLYCERIN 0.4 MG/SPRAY spray Commonly known as: NITROLINGUAL Place 1 spray under the tongue every 5 (five) minutes x 3 doses as needed for chest pain.   oxyCODONE-acetaminophen 7.5-325 MG tablet Commonly known as: PERCOCET Take 1 tablet by mouth 3 (three) times daily before meals.   pantoprazole 40 MG tablet Commonly known as: PROTONIX Take 1 tablet (40 mg total) by mouth 2 (two) times daily before a meal. X 1 month, then once daily forever What changed:   when to take this  additional instructions   Potassium Chloride ER 20 MEQ Tbcr One po daily What changed:   how much to take  how to take this  when to take this  additional instructions   simethicone 125 MG chewable tablet Commonly known as: MYLICON Chew 081 mg by mouth 3 (three) times daily with meals.   triamcinolone cream 0.1 % Commonly known as: KENALOG Apply 1 application topically 2 (two) times daily.   Vitamin D3 25 MCG (1000 UT) Caps Take 1 capsule every evening  by mouth.   warfarin 5 MG tablet Commonly known as: COUMADIN Take as directed. If you are unsure how to take this medication, talk to your nurse or doctor. Original instructions: TAKE ONE TABLET BY MOUTH ONCE DAILY. TAKE AS DIRECTED BY DOCTOR. What changed: See the new instructions.       Allergies  Allergen Reactions   Cefzil [Cefprozil] Nausea And Vomiting     Demerol Nausea And Vomiting    Consultations:  GI   Procedures/Studies: Dg Chest 2 View  Result Date: 05/15/2019 CLINICAL DATA:  Chest pain EXAM: CHEST - 2 VIEW COMPARISON:  04/25/2018 FINDINGS: The heart size and mediastinal contours are enlarged, stable. Calcific aortic knob. No focal airspace consolidation, pleural effusion, or pneumothorax. IMPRESSION: No active cardiopulmonary disease. Electronically Signed   By: Davina Poke M.D.   On: 05/15/2019 15:09   Ct Soft Tissue Neck W Contrast  Result Date: 05/15/2019 CLINICAL DATA:  Initial evaluation for acute pain with swallowing since yesterday. EXAM: CT NECK WITH CONTRAST TECHNIQUE: Multidetector CT imaging of the neck was performed using the standard protocol following the bolus administration of intravenous contrast. CONTRAST:  23mL OMNIPAQUE IOHEXOL 300 MG/ML  SOLN COMPARISON:  None available. FINDINGS: Pharynx and larynx: Oral cavity within normal limits without discrete mass or collection. No acute inflammatory changes seen about the dentition. Palatine tonsils symmetric and within normal limits. Parapharyngeal fat maintained. Remainder of the oropharynx and visualized nasopharynx within normal limits. No retropharyngeal collection. Epiglottis within normal limits. Vallecula clear. Remainder of the hypopharynx and supraglottic larynx within normal limits without appreciable swelling or inflammatory changes. True cords are opposed and not well assessed, although grossly symmetric and within normal limits. Subglottic airway clear. Salivary glands: Salivary glands including the parotid and submandibular glands are within normal limits. No sialolithiasis or acute sialoadenitis. Thyroid: Thyroid within normal limits. Lymph nodes: No pathologically enlarged or concerning adenopathy seen within the neck. Vascular: Moderate atherosclerotic change present about the aortic arch and carotid bifurcations. Associated stenosis of up to  approximately 60% noted at the origin of the left ICA. Right carotid artery system medialized into the retropharyngeal space. Limited intracranial: Unremarkable. Visualized orbits: Globes and orbital soft tissues not visualized on this exam. Mastoids and visualized paranasal sinuses: Visualized maxillary sinuses are clear. Mastoid air cells and middle ear cavities not included on this exam. Skeleton: No acute osseous abnormality. Sclerotic lesion within the C6 vertebral body most characteristic of a benign bone island. No other discrete or worrisome osseous lesions. Moderate cervical spondylosis noted at C5-6. Moderate Upper chest: Visualized upper chest demonstrates no acute finding. Other: None. IMPRESSION: 1. Negative CT of the neck. No acute inflammatory changes or other abnormality identified. 2. Aortic Atherosclerosis (ICD10-I70.0). 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 3. Moderate cervical spondylosis at C5-6. Electronically Signed   By: Jeannine Boga M.D.   On: 05/15/2019 21:34   Ct Chest Wo Contrast  Result Date: 05/15/2019 CLINICAL DATA:  Unexplained dysphagia. EXAM: CT CHEST WITHOUT CONTRAST TECHNIQUE: Multidetector CT imaging of the chest was performed following the standard protocol without IV contrast. COMPARISON:  None. FINDINGS: Cardiovascular: The heart size is relatively normal. Aortic calcifications are noted. The main pulmonary artery is significantly dilated measuring 3.8 cm in diameter. Mediastinum/Nodes: --No mediastinal or hilar lymphadenopathy. --No axillary lymphadenopathy. --No supraclavicular lymphadenopathy. --Normal thyroid gland. --The esophagus is unremarkable Lungs/Pleura: No pulmonary nodules or masses. No pleural effusion or pneumothorax. No focal airspace consolidation. No focal pleural abnormality. Upper Abdomen: The  patient is status post cholecystectomy. Musculoskeletal: There are multilevel compression fractures throughout the thoracic  spine, all of which appear to be chronic. There is a calcified left breast mass, presumably benign. Review of the MIP images confirms the above findings. IMPRESSION: 1. No acute cardiopulmonary process. 2. The main pulmonary artery is significantly dilated measuring 3.8 cm in diameter. This can be seen with pulmonary arterial hypertension. Aortic Atherosclerosis (ICD10-I70.0). Electronically Signed   By: Constance Holster M.D.   On: 05/15/2019 21:10        Discharge Exam: Vitals:   05/20/19 2128 05/21/19 0627  BP: (!) 100/56 (!) 145/66  Pulse: (!) 55 (!) 48  Resp: 20 20  Temp: 97.8 F (36.6 C) 98.8 F (37.1 C)  SpO2: 97% 96%   Vitals:   05/20/19 1510 05/20/19 2128 05/21/19 0500 05/21/19 0627  BP: 110/72 (!) 100/56  (!) 145/66  Pulse:  (!) 55  (!) 48  Resp:  20  20  Temp:  97.8 F (36.6 C)  98.8 F (37.1 C)  TempSrc:  Oral  Oral  SpO2: 94% 97%  96%  Weight:   107.4 kg   Height:        General: Pt is alert, awake, not in acute distress Cardiovascular: RRR, S1/S2 +, no rubs, no gallops Respiratory: CTA bilaterally, no wheezing, no rhonchi Abdominal: Soft, NT, ND, bowel sounds + Extremities: no edema, no cyanosis   The results of significant diagnostics from this hospitalization (including imaging, microbiology, ancillary and laboratory) are listed below for reference.    Significant Diagnostic Studies: Dg Chest 2 View  Result Date: 05/15/2019 CLINICAL DATA:  Chest pain EXAM: CHEST - 2 VIEW COMPARISON:  04/25/2018 FINDINGS: The heart size and mediastinal contours are enlarged, stable. Calcific aortic knob. No focal airspace consolidation, pleural effusion, or pneumothorax. IMPRESSION: No active cardiopulmonary disease. Electronically Signed   By: Davina Poke M.D.   On: 05/15/2019 15:09   Ct Soft Tissue Neck W Contrast  Result Date: 05/15/2019 CLINICAL DATA:  Initial evaluation for acute pain with swallowing since yesterday. EXAM: CT NECK WITH CONTRAST TECHNIQUE:  Multidetector CT imaging of the neck was performed using the standard protocol following the bolus administration of intravenous contrast. CONTRAST:  27mL OMNIPAQUE IOHEXOL 300 MG/ML  SOLN COMPARISON:  None available. FINDINGS: Pharynx and larynx: Oral cavity within normal limits without discrete mass or collection. No acute inflammatory changes seen about the dentition. Palatine tonsils symmetric and within normal limits. Parapharyngeal fat maintained. Remainder of the oropharynx and visualized nasopharynx within normal limits. No retropharyngeal collection. Epiglottis within normal limits. Vallecula clear. Remainder of the hypopharynx and supraglottic larynx within normal limits without appreciable swelling or inflammatory changes. True cords are opposed and not well assessed, although grossly symmetric and within normal limits. Subglottic airway clear. Salivary glands: Salivary glands including the parotid and submandibular glands are within normal limits. No sialolithiasis or acute sialoadenitis. Thyroid: Thyroid within normal limits. Lymph nodes: No pathologically enlarged or concerning adenopathy seen within the neck. Vascular: Moderate atherosclerotic change present about the aortic arch and carotid bifurcations. Associated stenosis of up to approximately 60% noted at the origin of the left ICA. Right carotid artery system medialized into the retropharyngeal space. Limited intracranial: Unremarkable. Visualized orbits: Globes and orbital soft tissues not visualized on this exam. Mastoids and visualized paranasal sinuses: Visualized maxillary sinuses are clear. Mastoid air cells and middle ear cavities not included on this exam. Skeleton: No acute osseous abnormality. Sclerotic lesion within the C6 vertebral body most  characteristic of a benign bone island. No other discrete or worrisome osseous lesions. Moderate cervical spondylosis noted at C5-6. Moderate Upper chest: Visualized upper chest demonstrates no  acute finding. Other: None. IMPRESSION: 1. Negative CT of the neck. No acute inflammatory changes or other abnormality identified. 2. Aortic Atherosclerosis (ICD10-I70.0). 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 3. Moderate cervical spondylosis at C5-6. Electronically Signed   By: Jeannine Boga M.D.   On: 05/15/2019 21:34   Ct Chest Wo Contrast  Result Date: 05/15/2019 CLINICAL DATA:  Unexplained dysphagia. EXAM: CT CHEST WITHOUT CONTRAST TECHNIQUE: Multidetector CT imaging of the chest was performed following the standard protocol without IV contrast. COMPARISON:  None. FINDINGS: Cardiovascular: The heart size is relatively normal. Aortic calcifications are noted. The main pulmonary artery is significantly dilated measuring 3.8 cm in diameter. Mediastinum/Nodes: --No mediastinal or hilar lymphadenopathy. --No axillary lymphadenopathy. --No supraclavicular lymphadenopathy. --Normal thyroid gland. --The esophagus is unremarkable Lungs/Pleura: No pulmonary nodules or masses. No pleural effusion or pneumothorax. No focal airspace consolidation. No focal pleural abnormality. Upper Abdomen: The patient is status post cholecystectomy. Musculoskeletal: There are multilevel compression fractures throughout the thoracic spine, all of which appear to be chronic. There is a calcified left breast mass, presumably benign. Review of the MIP images confirms the above findings. IMPRESSION: 1. No acute cardiopulmonary process. 2. The main pulmonary artery is significantly dilated measuring 3.8 cm in diameter. This can be seen with pulmonary arterial hypertension. Aortic Atherosclerosis (ICD10-I70.0). Electronically Signed   By: Constance Holster M.D.   On: 05/15/2019 21:10     Microbiology: Recent Results (from the past 240 hour(s))  SARS CORONAVIRUS 2 ([facility] 6-24 HRS) Nasopharyngeal Nasopharyngeal Swab     Status: None   Collection Time: 05/15/19  9:30 PM   Specimen: Nasopharyngeal Swab   Result Value Ref Range Status   SARS Coronavirus 2 NEGATIVE NEGATIVE Final    Comment: (NOTE) SARS-CoV-2 target nucleic acids are NOT DETECTED. The SARS-CoV-2 RNA is generally detectable in upper and lower respiratory specimens during the acute phase of infection. Negative results do not preclude SARS-CoV-2 infection, do not rule out co-infections with other pathogens, and should not be used as the sole basis for treatment or other patient management decisions. Negative results must be combined with clinical observations, patient history, and epidemiological information. The expected result is Negative. Fact Sheet for Patients: SugarRoll.be Fact Sheet for Healthcare Providers: https://www.woods-mathews.com/ This test is not yet approved or cleared by the [place] FDA and  has been authorized for detection and/or diagnosis of SARS-CoV-2 by FDA under an Emergency Use Authorization (EUA). This EUA will remain  in effect (meaning this test can be used) for the duration of the COVID-19 declaration under Section 56 4(b)(1) of the Act, 21 U.S.C. section 360bbb-3(b)(1), unless the authorization is terminated or revoked sooner. Performed at [facility], [place] [address] [place], [place] [address]   MRSA PCR Screening     Status: None   Collection Time: 05/16/19  4:58 AM   Specimen: Nasopharyngeal  Result Value Ref Range Status   MRSA by PCR NEGATIVE NEGATIVE Final    Comment:        The GeneXpert MRSA Assay (FDA approved for NASAL specimens only), is one component of a comprehensive MRSA colonization surveillance program. It is not intended to diagnose MRSA infection nor to guide or monitor treatment for MRSA infections. Performed at [facility], [address], [place], [place] [address]   SARS Coronavirus  2 by RT PCR (hospital order, performed in [facility] hospital lab)     Status: None   Collection Time: 05/16/19  1:00 PM   Result Value Ref Range Status   SARS Coronavirus 2 NEGATIVE NEGATIVE Final    Comment: (NOTE) If result is NEGATIVE SARS-CoV-2 target nucleic acids are NOT DETECTED. The SARS-CoV-2 RNA is generally detectable in upper and lower  respiratory specimens during the acute phase of infection. The lowest  concentration of SARS-CoV-2 viral copies this assay can detect is 250  copies / mL. A negative result does not preclude SARS-CoV-2 infection  and should not be used as the sole basis for treatment or other  patient management decisions.  A negative result may occur with  improper specimen collection / handling, submission of specimen other  than nasopharyngeal swab, presence of viral mutation(s) within the  areas targeted by this assay, and inadequate number of viral copies  (<250 copies / mL). A negative result must be combined with clinical  observations, patient history, and epidemiological information. If result is POSITIVE SARS-CoV-2 target nucleic acids are DETECTED. The SARS-CoV-2 RNA is generally detectable in upper and lower  respiratory specimens dur ing the acute phase of infection.  Positive  results are indicative of active infection with SARS-CoV-2.  Clinical  correlation with patient history and other diagnostic information is  necessary to determine patient infection status.  Positive results do  not rule out bacterial infection or co-infection with other viruses. If result is PRESUMPTIVE POSTIVE SARS-CoV-2 nucleic acids MAY BE PRESENT.   A presumptive positive result was obtained on the submitted specimen  and confirmed on repeat testing.  While 2019 novel coronavirus  (SARS-CoV-2) nucleic acids may be present in the submitted sample  additional confirmatory testing may be necessary for epidemiological  and / or clinical management purposes  to differentiate between  SARS-CoV-2 and other Sarbecovirus currently known to infect humans.  If clinically indicated additional  testing with an alternate test  methodology [phone]) is advised. The SARS-CoV-2 RNA is generally  detectable in upper and lower respiratory sp ecimens during the acute  phase of infection. The expected result is Negative. Fact Sheet for Patients:  StrictlyIdeas.no Fact Sheet for Healthcare Providers: BankingDealers.co.za This test is not yet approved or cleared by the [place] FDA and has been authorized for detection and/or diagnosis of SARS-CoV-2 by FDA under an Emergency Use Authorization (EUA).  This EUA will remain in effect (meaning this test can be used) for the duration of the COVID-19 declaration under Section 564(b)(1) of the Act, 21 U.S.C. section 360bbb-3(b)(1), unless the authorization is terminated or revoked sooner. Performed at [facility], [address] [place], [place] [address]      Labs: Basic Metabolic Panel: Recent Labs  Lab 05/16/19 [phone] 05/18/19 0624 05/19/19 0442 05/20/19 0549 05/21/19 0610  NA 142 143 146* 143 141  K 4.1 3.5 3.7 3.2* 4.0  CL 104 109 108 103 106  CO2 28 24 27 30 28   GLUCOSE 79 85 75 81 80  BUN 16 13 12 12 13   CREATININE 0.68 0.89 0.80 0.81 0.76  CALCIUM 9.1 8.3* 8.4* 8.7* 8.5*  MG  --  1.6* 2.0  --  1.6*   Liver Function Tests: Recent Labs  Lab 05/16/19 0905  AST 17  ALT 12  ALKPHOS 69  BILITOT 0.9  PROT 7.0  ALBUMIN 3.4*   No results for input(s): LIPASE, AMYLASE in the last 168 hours. No results for input(s): AMMONIA  in the last 168 hours. CBC: Recent Labs  Lab 05/15/19 1443 05/16/19 0905 05/20/19 0549  WBC 8.7 9.0 5.8  NEUTROABS 6.1  --   --   HGB 11.8* 13.2 11.3*  HCT 39.7 43.7 38.3  MCV 94.3 92.8 93.2  PLT 279 275 241   Cardiac Enzymes: No results for input(s): CKTOTAL, CKMB, CKMBINDEX, TROPONINI in the last 168 hours. BNP: Invalid input(s): POCBNP CBG: Recent Labs  Lab 05/18/19 0754 05/18/19 1132 05/18/19 1635 05/18/19 2140 05/19/19 2125   GLUCAP 89 107* 96 107* 96    Time coordinating discharge:  36 minutes  Signed:  Orson Eva, DO [facility] Pager: [phone] 05/21/2019, 9:19 AM

## 2019-05-20 NOTE — Progress Notes (Signed)
ANTICOAGULATION CONSULT NOTE -   Pharmacy Consult for warfarin Indication: atrial fibrillation  Allergies  Allergen Reactions  . Cefzil [Cefprozil] Nausea And Vomiting  . Demerol Nausea And Vomiting    Patient Measurements: Height: 5\' 1"  (154.9 cm) Weight: 234 lb 2.1 oz (106.2 kg) IBW/kg (Calculated) : 47.8   Vital Signs: Temp: 97.7 F (36.5 C) (11/26 0632) Temp Source: Oral (11/26 3149) BP: 148/64 (11/26 7026) Pulse Rate: 49 (11/26 0632)  Labs: Recent Labs    05/18/19 0624 05/19/19 0442 05/20/19 0549  HGB  --   --  11.3*  HCT  --   --  38.3  PLT  --   --  241  LABPROT 15.7* 18.9* 20.7*  INR 1.3* 1.6* 1.8*  CREATININE 0.89 0.80 0.81    Estimated Creatinine Clearance: 62.3 mL/min (by C-G formula based on SCr of 0.81 mg/dL).   Medical History: Past Medical History:  Diagnosis Date  . Allergy   . Allergy history unknown   . Arthritis   . ASCVD (arteriosclerotic cardiovascular disease)    stent to mid and proximal left anterior descending in 06/2002;drug eluting stent placed in the second diagnol in 08/2003 after  A  non-st elevation myocardial infarction   . Asthma   . Atrial fibrillation ([place])   . Chronic anticoagulation   . Chronic nausea   . Chronic pain of left knee   . Coronary artery disease   . Current chronic use of systemic steroids 05/11/2016  . Depression   . Diabetes mellitus    no insulin  . Diarrhea    acute  . Diverticulitis of intestine   . FH: colonic polyps    adenomataous  . GERD (gastroesophageal reflux disease)   . History of kidney stones   . Hyperlipidemia    pulmonary embolism 2000 and 09/2008  . Hypertension   . Hypothyroidism   . Insomnia   . Irritable bowel syndrome   . Myocardial infarction ([place])   . Paroxysmal atrial fibrillation ([facility])    normal LV function; episodes occurred in 205 and 09/2007  . Pedal edema   . Peripheral edema   . PONV (postoperative nausea and vomiting)   . Pulmonary embolism ([place])    2000/09/2008   . Rectal bleeding   . Sleep apnea   . Thyroid disease    hypothyroidism  . Tobacco user    stopped   . Venous stasis     Medications:  Medications Prior to Admission  Medication Sig Dispense Refill Last Dose  . acetaminophen (TYLENOL) 500 MG tablet Take 500 mg by mouth daily as needed for headache.   05/15/2019 at Unknown time  . albuterol (PROVENTIL) (2.5 MG/3ML) 0.083% nebulizer solution Take 3 mLs (2.5 mg total) by nebulization every 6 (six) hours as needed for wheezing or shortness of breath. 75 mL 5 Past Month at Unknown time  . albuterol (VENTOLIN HFA) 108 (90 Base) MCG/ACT inhaler INHALE 2 PUFFS INTO THE LUNGS 4 TIMES DAILY AS NEEDED FOR WHEEZING. 18 g 5 Past Month at Unknown time  . atorvastatin (LIPITOR) 40 MG tablet TAKE ONE TABLET BY MOUTH DAILY. (Patient taking differently: Take 40 mg by mouth every evening. ) 30 tablet 3 05/14/2019 at Unknown time  . Cholecalciferol (VITAMIN D3) 1000 units CAPS Take 1 capsule every evening by mouth.    05/14/2019 at Unknown time  . conjugated estrogens (PREMARIN) vaginal cream Place 1 Applicatorful vaginally 2 (two) times a week. On Mondays and Fridays   05/14/2019 at Unknown  time  . diltiazem (CARDIZEM CD) 180 MG 24 hr capsule TAKE ONE CAPSULE BY MOUTH DAILY. (Patient taking differently: Take 180 mg by mouth daily. ) 30 capsule 11 05/15/2019 at Unknown time  . diphenoxylate-atropine (LOMOTIL) 2.5-0.025 MG tablet TAKE ONE TABLET BY MOUTH 2 TIMES A DAY AS NEEDED. (Patient taking differently: Take 1 tablet by mouth every 12 (twelve) hours as needed for diarrhea or loose stools. ) 60 tablet 0 Past Month at Unknown time  . doxycycline (VIBRA-TABS) 100 MG tablet Take 100 mg by mouth 2 (two) times daily. 5 day course starting on 05/10/2019   05/15/2019 at Unknown time  . escitalopram (LEXAPRO) 20 MG tablet TAKE ONE TABLET BY MOUTH ONCE DAILY. (Patient taking differently: Take 20 mg by mouth daily. ) 30 tablet 0 05/15/2019 at Unknown time  . furosemide  (LASIX) 40 MG tablet TAKE ONE TABLET BY MOUTH DAILY. (Patient taking differently: Take 20 mg by mouth daily. ) 30 tablet 0 05/14/2019 at Unknown time  . hydrocortisone cream 1 % Apply 1 application topically 2 (two) times daily.   05/15/2019 at Unknown time  . levothyroxine (SYNTHROID, LEVOTHROID) 75 MCG tablet Take 1 tablet (75 mcg total) by mouth daily. 90 tablet 1 05/15/2019 at Unknown time  . losartan (COZAAR) 25 MG tablet TAKE 1 TABLET BY MOUTH ONCE DAILY. (Patient taking differently: Take 25 mg by mouth daily. ) 90 tablet 3 05/15/2019 at Unknown time  . meclizine (ANTIVERT) 25 MG tablet Take 1 tablet (25 mg total) by mouth 3 (three) times daily as needed for dizziness. (Patient taking differently: Take 25 mg by mouth 2 (two) times daily. ) 30 tablet 0 05/15/2019 at Unknown time  . nitroGLYCERIN (NITROLINGUAL) 0.4 MG/SPRAY spray Place 1 spray under the tongue every 5 (five) minutes x 3 doses as needed for chest pain. 12 g 3 unknown  . pantoprazole (PROTONIX) 40 MG tablet Take 1 tablet (40 mg total) by mouth daily. 60 tablet 3 05/15/2019 at Unknown time  . Potassium Chloride ER 20 MEQ TBCR One po daily (Patient taking differently: Take 20 mEq by mouth daily. ) 90 tablet 1 05/15/2019 at Unknown time  . simethicone (MYLICON) 366 MG chewable tablet Chew 125 mg by mouth 3 (three) times daily with meals.   05/15/2019 at Unknown time  . triamcinolone cream (KENALOG) 0.1 % Apply 1 application topically 2 (two) times daily. 60 g 6 05/15/2019 at Unknown time  . warfarin (COUMADIN) 5 MG tablet TAKE ONE TABLET BY MOUTH ONCE DAILY. TAKE AS DIRECTED BY DOCTOR. (Patient taking differently: Take 5 mg by mouth every evening. ) 30 tablet 5 05/14/2019 at 1700    Assessment: Pharmacy consulted to dose warfarin in patient with atrial fibrillation.  Coumadin was held for upper GI endoscopy and ok to restart per GI.  Vitamin K 2.5 mg PO given on 11/21 INR 1.7> 1.3>> 1.6>1.8   Home dose listed as 5 mg daily.  Goal  of Therapy:  INR 2-3 Monitor platelets by anticoagulation protocol: Yes   Plan:  Warfarin 5 mg x 1 dose. Monitor labs and s/s of bleeding.  Margot Ables, PharmD Clinical Pharmacist 05/20/2019 7:54 AM

## 2019-05-21 DIAGNOSIS — R41841 Cognitive communication deficit: Secondary | ICD-10-CM | POA: Diagnosis not present

## 2019-05-21 DIAGNOSIS — K209 Esophagitis, unspecified without bleeding: Secondary | ICD-10-CM | POA: Diagnosis not present

## 2019-05-21 DIAGNOSIS — M6281 Muscle weakness (generalized): Secondary | ICD-10-CM | POA: Diagnosis not present

## 2019-05-21 DIAGNOSIS — R1312 Dysphagia, oropharyngeal phase: Secondary | ICD-10-CM | POA: Diagnosis not present

## 2019-05-21 DIAGNOSIS — R262 Difficulty in walking, not elsewhere classified: Secondary | ICD-10-CM | POA: Diagnosis not present

## 2019-05-21 DIAGNOSIS — U071 COVID-19: Secondary | ICD-10-CM | POA: Diagnosis not present

## 2019-05-21 DIAGNOSIS — E0822 Diabetes mellitus due to underlying condition with diabetic chronic kidney disease: Secondary | ICD-10-CM | POA: Diagnosis not present

## 2019-05-21 DIAGNOSIS — I480 Paroxysmal atrial fibrillation: Secondary | ICD-10-CM | POA: Diagnosis not present

## 2019-05-21 DIAGNOSIS — R1310 Dysphagia, unspecified: Secondary | ICD-10-CM | POA: Diagnosis not present

## 2019-05-21 DIAGNOSIS — R2681 Unsteadiness on feet: Secondary | ICD-10-CM | POA: Diagnosis not present

## 2019-05-21 DIAGNOSIS — R0789 Other chest pain: Secondary | ICD-10-CM | POA: Diagnosis not present

## 2019-05-21 DIAGNOSIS — Z8616 Personal history of COVID-19: Secondary | ICD-10-CM | POA: Diagnosis not present

## 2019-05-21 LAB — BASIC METABOLIC PANEL
Anion gap: 7 (ref 5–15)
BUN: 13 mg/dL (ref 8–23)
CO2: 28 mmol/L (ref 22–32)
Calcium: 8.5 mg/dL — ABNORMAL LOW (ref 8.9–10.3)
Chloride: 106 mmol/L (ref 98–111)
Creatinine, Ser: 0.76 mg/dL (ref 0.44–1.00)
GFR calc Af Amer: >60 mL/min (ref >60)
GFR calc non Af Amer: >60 mL/min (ref >60)
Glucose, Bld: 80 mg/dL (ref 70–99)
Potassium: 4 mmol/L (ref 3.5–5.1)
Sodium: 141 mmol/L (ref 135–145)

## 2019-05-21 LAB — PROTIME-INR
INR: 2.3 — ABNORMAL HIGH (ref 0.8–1.2)
Prothrombin Time: 25.3 seconds — ABNORMAL HIGH (ref 11.4–15.2)

## 2019-05-21 LAB — MAGNESIUM: Magnesium: 1.6 mg/dL — ABNORMAL LOW (ref 1.7–2.4)

## 2019-05-21 MED ORDER — METOCLOPRAMIDE HCL 5 MG PO TABS: 5.0000 mg | ORAL_TABLET | Freq: Three times a day (TID) | ORAL | 0 refills | Status: DC

## 2019-05-21 MED ORDER — WARFARIN SODIUM 5 MG PO TABS: 5.0000 mg | ORAL_TABLET | Freq: Once | ORAL | Status: DC

## 2019-05-21 MED ORDER — DILTIAZEM HCL ER COATED BEADS 120 MG PO CP24: 120.0000 mg | ORAL_CAPSULE | Freq: Every day | ORAL | Status: DC

## 2019-05-21 NOTE — Progress Notes (Signed)
Nsg Discharge Note  Admit Date:  05/15/2019 Discharge date: 05/21/2019   [name] to be D/C'd to [facility] per MD order.  AVS completed.  Copy for chart, and copy for patient signed, and dated. Patient/caregiver able to verbalize understanding.  Discharge Medication: Allergies as of 05/21/2019      Reactions   Cefzil [cefprozil] Nausea And Vomiting   Demerol Nausea And Vomiting      Medication List    STOP taking these medications   doxycycline 100 MG tablet Commonly known as: VIBRA-TABS   losartan 25 MG tablet Commonly known as: COZAAR     TAKE these medications   acetaminophen 500 MG tablet Commonly known as: TYLENOL Take 500 mg by mouth daily as needed for headache.   albuterol (2.5 MG/3ML) 0.083% nebulizer solution Commonly known as: PROVENTIL Take 3 mLs (2.5 mg total) by nebulization every 6 (six) hours as needed for wheezing or shortness of breath.   albuterol 108 (90 Base) MCG/ACT inhaler Commonly known as: Ventolin HFA INHALE 2 PUFFS INTO THE LUNGS 4 TIMES DAILY AS NEEDED FOR WHEEZING.   atorvastatin 40 MG tablet Commonly known as: LIPITOR TAKE ONE TABLET BY MOUTH DAILY. What changed: when to take this   conjugated estrogens vaginal cream Commonly known as: PREMARIN Place 1 Applicatorful vaginally 2 (two) times a week. On Mondays and Fridays   diltiazem 120 MG 24 hr capsule Commonly known as: CARDIZEM CD Take 1 capsule (120 mg total) by mouth daily. What changed:   medication strength  how much to take   diphenoxylate-atropine 2.5-0.025 MG tablet Commonly known as: LOMOTIL TAKE ONE TABLET BY MOUTH 2 TIMES A DAY AS NEEDED. What changed: See the new instructions.   escitalopram 20 MG tablet Commonly known as: LEXAPRO TAKE ONE TABLET BY MOUTH ONCE DAILY.   furosemide 40 MG tablet Commonly known as: LASIX TAKE ONE TABLET BY MOUTH DAILY. What changed: how much to take   hydrocortisone cream 1 % Apply 1 application topically 2 (two) times  daily.   levothyroxine 75 MCG tablet Commonly known as: SYNTHROID Take 1 tablet (75 mcg total) by mouth daily.   lidocaine 2 % solution Commonly known as: XYLOCAINE Use as directed 10 mLs in the mouth or throat 4 (four) times daily -  with meals and at bedtime.   lidocaine 2 % solution Commonly known as: XYLOCAINE Use as directed 15 mLs in the mouth or throat every 6 (six) hours as needed (odynophagia).   meclizine 25 MG tablet Commonly known as: ANTIVERT Take 1 tablet (25 mg total) by mouth 3 (three) times daily as needed for dizziness. What changed: when to take this   metoCLOPramide 5 MG tablet Commonly known as: Reglan Take 1 tablet (5 mg total) by mouth 3 (three) times daily before meals.   nitroGLYCERIN 0.4 MG/SPRAY spray Commonly known as: NITROLINGUAL Place 1 spray under the tongue every 5 (five) minutes x 3 doses as needed for chest pain.   oxyCODONE-acetaminophen 7.5-325 MG tablet Commonly known as: PERCOCET Take 1 tablet by mouth 3 (three) times daily before meals.   pantoprazole 40 MG tablet Commonly known as: PROTONIX Take 1 tablet (40 mg total) by mouth 2 (two) times daily before a meal. X 1 month, then once daily forever What changed:   when to take this  additional instructions   Potassium Chloride ER 20 MEQ Tbcr One po daily What changed:   how much to take  how to take this  when to take  this  additional instructions   simethicone 125 MG chewable tablet Commonly known as: MYLICON Chew 497 mg by mouth 3 (three) times daily with meals.   triamcinolone cream 0.1 % Commonly known as: KENALOG Apply 1 application topically 2 (two) times daily.   Vitamin D3 25 MCG (1000 UT) Caps Take 1 capsule every evening by mouth.   warfarin 5 MG tablet Commonly known as: COUMADIN Take as directed. If you are unsure how to take this medication, talk to your nurse or doctor. Original instructions: TAKE ONE TABLET BY MOUTH ONCE DAILY. TAKE AS DIRECTED BY  DOCTOR. What changed: See the new instructions.       Discharge Assessment: Vitals:   05/21/19 0627 05/21/19 1255  BP: (!) 145/66 101/61  Pulse: (!) 48 (!) 51  Resp: 20 20  Temp: 98.8 F (37.1 C) 97.7 F (36.5 C)  SpO2: 96% 98%   Skin clean, dry and intact without evidence of skin break down, no evidence of skin tears noted. IV catheter discontinued intact. Site without signs and symptoms of complications - no redness or edema noted at insertion site, patient denies c/o pain - only slight tenderness at site.  Dressing with slight pressure applied.  D/c Instructions-Education: Discharge instructions given to patient/family with verbalized understanding. D/c education completed with patient/family including follow up instructions, medication list, d/c activities limitations if indicated, with other d/c instructions as indicated by MD - patient able to verbalize understanding, all questions fully answered. Patient instructed to return to ED, call 911, or call MD for any changes in condition.  Patient escorted via [place], and D/C home via private auto.  Venita Sheffield, RN 05/21/2019 1:49 PM

## 2019-05-21 NOTE — Progress Notes (Signed)
ANTICOAGULATION CONSULT NOTE -   Pharmacy Consult for warfarin Indication: atrial fibrillation  Allergies  Allergen Reactions  . Cefzil [Cefprozil] Nausea And Vomiting  . Demerol Nausea And Vomiting    Patient Measurements: Height: 5\' 1"  (154.9 cm) Weight: 236 lb 12.4 oz (107.4 kg) IBW/kg (Calculated) : 47.8   Vital Signs: Temp: 98.8 F (37.1 C) (11/27 0627) Temp Source: Oral (11/27 0627) BP: 145/66 (11/27 0627) Pulse Rate: 48 (11/27 0627)  Labs: Recent Labs    05/19/19 0442 05/20/19 0549 05/21/19 0610  HGB  --  11.3*  --   HCT  --  38.3  --   PLT  --  241  --   LABPROT 18.9* 20.7* 25.3*  INR 1.6* 1.8* 2.3*  CREATININE 0.80 0.81  --     Estimated Creatinine Clearance: 62.6 mL/min (by C-G formula based on SCr of 0.81 mg/dL).   Medical History: Past Medical History:  Diagnosis Date  . Allergy   . Allergy history unknown   . Arthritis   . ASCVD (arteriosclerotic cardiovascular disease)    stent to mid and proximal left anterior descending in 06/2002;drug eluting stent placed in the second diagnol in 08/2003 after  A  non-st elevation myocardial infarction   . Asthma   . Atrial fibrillation ([place])   . Chronic anticoagulation   . Chronic nausea   . Chronic pain of left knee   . Coronary artery disease   . Current chronic use of systemic steroids 05/11/2016  . Depression   . Diabetes mellitus    no insulin  . Diarrhea    acute  . Diverticulitis of intestine   . FH: colonic polyps    adenomataous  . GERD (gastroesophageal reflux disease)   . History of kidney stones   . Hyperlipidemia    pulmonary embolism 2000 and 09/2008  . Hypertension   . Hypothyroidism   . Insomnia   . Irritable bowel syndrome   . Myocardial infarction ([place])   . Paroxysmal atrial fibrillation ([facility])    normal LV function; episodes occurred in 205 and 09/2007  . Pedal edema   . Peripheral edema   . PONV (postoperative nausea and vomiting)   . Pulmonary embolism ([place])    2000/09/2008  . Rectal bleeding   . Sleep apnea   . Thyroid disease    hypothyroidism  . Tobacco user    stopped   . Venous stasis     Medications:  Medications Prior to Admission  Medication Sig Dispense Refill Last Dose  . acetaminophen (TYLENOL) 500 MG tablet Take 500 mg by mouth daily as needed for headache.   05/15/2019 at Unknown time  . albuterol (PROVENTIL) (2.5 MG/3ML) 0.083% nebulizer solution Take 3 mLs (2.5 mg total) by nebulization every 6 (six) hours as needed for wheezing or shortness of breath. 75 mL 5 Past Month at Unknown time  . albuterol (VENTOLIN HFA) 108 (90 Base) MCG/ACT inhaler INHALE 2 PUFFS INTO THE LUNGS 4 TIMES DAILY AS NEEDED FOR WHEEZING. 18 g 5 Past Month at Unknown time  . atorvastatin (LIPITOR) 40 MG tablet TAKE ONE TABLET BY MOUTH DAILY. (Patient taking differently: Take 40 mg by mouth every evening. ) 30 tablet 3 05/14/2019 at Unknown time  . Cholecalciferol (VITAMIN D3) 1000 units CAPS Take 1 capsule every evening by mouth.    05/14/2019 at Unknown time  . conjugated estrogens (PREMARIN) vaginal cream Place 1 Applicatorful vaginally 2 (two) times a week. On Mondays and Fridays   05/14/2019 at  Unknown time  . diltiazem (CARDIZEM CD) 180 MG 24 hr capsule TAKE ONE CAPSULE BY MOUTH DAILY. (Patient taking differently: Take 180 mg by mouth daily. ) 30 capsule 11 05/15/2019 at Unknown time  . diphenoxylate-atropine (LOMOTIL) 2.5-0.025 MG tablet TAKE ONE TABLET BY MOUTH 2 TIMES A DAY AS NEEDED. (Patient taking differently: Take 1 tablet by mouth every 12 (twelve) hours as needed for diarrhea or loose stools. ) 60 tablet 0 Past Month at Unknown time  . doxycycline (VIBRA-TABS) 100 MG tablet Take 100 mg by mouth 2 (two) times daily. 5 day course starting on 05/10/2019   05/15/2019 at Unknown time  . escitalopram (LEXAPRO) 20 MG tablet TAKE ONE TABLET BY MOUTH ONCE DAILY. (Patient taking differently: Take 20 mg by mouth daily. ) 30 tablet 0 05/15/2019 at Unknown time  .  furosemide (LASIX) 40 MG tablet TAKE ONE TABLET BY MOUTH DAILY. (Patient taking differently: Take 20 mg by mouth daily. ) 30 tablet 0 05/14/2019 at Unknown time  . hydrocortisone cream 1 % Apply 1 application topically 2 (two) times daily.   05/15/2019 at Unknown time  . levothyroxine (SYNTHROID, LEVOTHROID) 75 MCG tablet Take 1 tablet (75 mcg total) by mouth daily. 90 tablet 1 05/15/2019 at Unknown time  . losartan (COZAAR) 25 MG tablet TAKE 1 TABLET BY MOUTH ONCE DAILY. (Patient taking differently: Take 25 mg by mouth daily. ) 90 tablet 3 05/15/2019 at Unknown time  . meclizine (ANTIVERT) 25 MG tablet Take 1 tablet (25 mg total) by mouth 3 (three) times daily as needed for dizziness. (Patient taking differently: Take 25 mg by mouth 2 (two) times daily. ) 30 tablet 0 05/15/2019 at Unknown time  . nitroGLYCERIN (NITROLINGUAL) 0.4 MG/SPRAY spray Place 1 spray under the tongue every 5 (five) minutes x 3 doses as needed for chest pain. 12 g 3 unknown  . pantoprazole (PROTONIX) 40 MG tablet Take 1 tablet (40 mg total) by mouth daily. 60 tablet 3 05/15/2019 at Unknown time  . Potassium Chloride ER 20 MEQ TBCR One po daily (Patient taking differently: Take 20 mEq by mouth daily. ) 90 tablet 1 05/15/2019 at Unknown time  . simethicone (MYLICON) 450 MG chewable tablet Chew 125 mg by mouth 3 (three) times daily with meals.   05/15/2019 at Unknown time  . triamcinolone cream (KENALOG) 0.1 % Apply 1 application topically 2 (two) times daily. 60 g 6 05/15/2019 at Unknown time  . warfarin (COUMADIN) 5 MG tablet TAKE ONE TABLET BY MOUTH ONCE DAILY. TAKE AS DIRECTED BY DOCTOR. (Patient taking differently: Take 5 mg by mouth every evening. ) 30 tablet 5 05/14/2019 at 1700    Assessment: Pharmacy consulted to dose warfarin in patient with atrial fibrillation.  Coumadin was held for upper GI endoscopy and ok to restart per GI.  Vitamin K 2.5 mg PO given on 11/21 INR therapeutic at 2.3   Home dose listed as 5 mg  daily.  Goal of Therapy:  INR 2-3 Monitor platelets by anticoagulation protocol: Yes   Plan:  Warfarin 5 mg x 1 dose. Monitor labs and s/s of bleeding.  Margot Ables, PharmD Clinical Pharmacist 05/21/2019 7:45 AM

## 2019-05-21 NOTE — Clinical Social Work Note (Signed)
Message left for niece advising of discharge.     [place], Clydene Pugh, LCSW

## 2019-05-21 NOTE — Progress Notes (Signed)
Report called to nurse [place] at [facility]. Awaiting EMS for transport

## 2019-05-21 NOTE — Plan of Care (Signed)

## 2019-05-23 DIAGNOSIS — I480 Paroxysmal atrial fibrillation: Secondary | ICD-10-CM | POA: Diagnosis not present

## 2019-05-23 DIAGNOSIS — M6281 Muscle weakness (generalized): Secondary | ICD-10-CM | POA: Diagnosis not present

## 2019-05-23 DIAGNOSIS — R0789 Other chest pain: Secondary | ICD-10-CM | POA: Diagnosis not present

## 2019-05-23 DIAGNOSIS — K209 Esophagitis, unspecified without bleeding: Secondary | ICD-10-CM | POA: Diagnosis not present

## 2019-05-23 DIAGNOSIS — R1310 Dysphagia, unspecified: Secondary | ICD-10-CM | POA: Diagnosis not present

## 2019-05-23 DIAGNOSIS — R262 Difficulty in walking, not elsewhere classified: Secondary | ICD-10-CM | POA: Diagnosis not present

## 2019-05-24 DIAGNOSIS — E785 Hyperlipidemia, unspecified: Secondary | ICD-10-CM | POA: Diagnosis not present

## 2019-05-24 DIAGNOSIS — R262 Difficulty in walking, not elsewhere classified: Secondary | ICD-10-CM | POA: Diagnosis not present

## 2019-05-24 DIAGNOSIS — R1310 Dysphagia, unspecified: Secondary | ICD-10-CM | POA: Diagnosis not present

## 2019-05-24 DIAGNOSIS — E119 Type 2 diabetes mellitus without complications: Secondary | ICD-10-CM | POA: Diagnosis not present

## 2019-05-24 DIAGNOSIS — I480 Paroxysmal atrial fibrillation: Secondary | ICD-10-CM | POA: Diagnosis not present

## 2019-05-24 DIAGNOSIS — K209 Esophagitis, unspecified without bleeding: Secondary | ICD-10-CM | POA: Diagnosis not present

## 2019-05-24 DIAGNOSIS — M6281 Muscle weakness (generalized): Secondary | ICD-10-CM | POA: Diagnosis not present

## 2019-05-24 DIAGNOSIS — D649 Anemia, unspecified: Secondary | ICD-10-CM | POA: Diagnosis not present

## 2019-05-24 DIAGNOSIS — I4891 Unspecified atrial fibrillation: Secondary | ICD-10-CM | POA: Diagnosis not present

## 2019-05-24 DIAGNOSIS — K2100 Gastro-esophageal reflux disease with esophagitis, without bleeding: Secondary | ICD-10-CM | POA: Diagnosis not present

## 2019-05-24 DIAGNOSIS — I10 Essential (primary) hypertension: Secondary | ICD-10-CM | POA: Diagnosis not present

## 2019-05-24 DIAGNOSIS — Z79899 Other long term (current) drug therapy: Secondary | ICD-10-CM | POA: Diagnosis not present

## 2019-05-24 DIAGNOSIS — Z7901 Long term (current) use of anticoagulants: Secondary | ICD-10-CM | POA: Diagnosis not present

## 2019-05-24 DIAGNOSIS — R0789 Other chest pain: Secondary | ICD-10-CM | POA: Diagnosis not present

## 2019-05-24 DIAGNOSIS — E039 Hypothyroidism, unspecified: Secondary | ICD-10-CM | POA: Diagnosis not present

## 2019-05-24 DIAGNOSIS — I119 Hypertensive heart disease without heart failure: Secondary | ICD-10-CM | POA: Diagnosis not present

## 2019-05-25 DIAGNOSIS — I480 Paroxysmal atrial fibrillation: Secondary | ICD-10-CM | POA: Diagnosis not present

## 2019-05-25 DIAGNOSIS — R1310 Dysphagia, unspecified: Secondary | ICD-10-CM | POA: Diagnosis not present

## 2019-05-25 DIAGNOSIS — R0789 Other chest pain: Secondary | ICD-10-CM | POA: Diagnosis not present

## 2019-05-25 DIAGNOSIS — K209 Esophagitis, unspecified without bleeding: Secondary | ICD-10-CM | POA: Diagnosis not present

## 2019-05-25 DIAGNOSIS — M6281 Muscle weakness (generalized): Secondary | ICD-10-CM | POA: Diagnosis not present

## 2019-05-25 DIAGNOSIS — R262 Difficulty in walking, not elsewhere classified: Secondary | ICD-10-CM | POA: Diagnosis not present

## 2019-05-26 DIAGNOSIS — I480 Paroxysmal atrial fibrillation: Secondary | ICD-10-CM | POA: Diagnosis not present

## 2019-05-26 DIAGNOSIS — R0789 Other chest pain: Secondary | ICD-10-CM | POA: Diagnosis not present

## 2019-05-26 DIAGNOSIS — L718 Other rosacea: Secondary | ICD-10-CM | POA: Diagnosis not present

## 2019-05-26 DIAGNOSIS — M6281 Muscle weakness (generalized): Secondary | ICD-10-CM | POA: Diagnosis not present

## 2019-05-26 DIAGNOSIS — K209 Esophagitis, unspecified without bleeding: Secondary | ICD-10-CM | POA: Diagnosis not present

## 2019-05-26 DIAGNOSIS — R1310 Dysphagia, unspecified: Secondary | ICD-10-CM | POA: Diagnosis not present

## 2019-05-26 DIAGNOSIS — R262 Difficulty in walking, not elsewhere classified: Secondary | ICD-10-CM | POA: Diagnosis not present

## 2019-05-27 DIAGNOSIS — E785 Hyperlipidemia, unspecified: Secondary | ICD-10-CM | POA: Diagnosis not present

## 2019-05-27 DIAGNOSIS — Z5181 Encounter for therapeutic drug level monitoring: Secondary | ICD-10-CM | POA: Diagnosis not present

## 2019-05-27 DIAGNOSIS — E039 Hypothyroidism, unspecified: Secondary | ICD-10-CM | POA: Diagnosis not present

## 2019-05-27 DIAGNOSIS — L719 Rosacea, unspecified: Secondary | ICD-10-CM | POA: Diagnosis not present

## 2019-05-27 DIAGNOSIS — D649 Anemia, unspecified: Secondary | ICD-10-CM | POA: Diagnosis not present

## 2019-05-27 DIAGNOSIS — Z79899 Other long term (current) drug therapy: Secondary | ICD-10-CM | POA: Diagnosis not present

## 2019-05-27 DIAGNOSIS — I4891 Unspecified atrial fibrillation: Secondary | ICD-10-CM | POA: Diagnosis not present

## 2019-05-27 DIAGNOSIS — E119 Type 2 diabetes mellitus without complications: Secondary | ICD-10-CM | POA: Diagnosis not present

## 2019-05-27 DIAGNOSIS — Z7901 Long term (current) use of anticoagulants: Secondary | ICD-10-CM | POA: Diagnosis not present

## 2019-05-28 DIAGNOSIS — R1310 Dysphagia, unspecified: Secondary | ICD-10-CM | POA: Diagnosis not present

## 2019-05-28 DIAGNOSIS — R262 Difficulty in walking, not elsewhere classified: Secondary | ICD-10-CM | POA: Diagnosis not present

## 2019-05-28 DIAGNOSIS — K209 Esophagitis, unspecified without bleeding: Secondary | ICD-10-CM | POA: Diagnosis not present

## 2019-05-28 DIAGNOSIS — I480 Paroxysmal atrial fibrillation: Secondary | ICD-10-CM | POA: Diagnosis not present

## 2019-05-28 DIAGNOSIS — R0789 Other chest pain: Secondary | ICD-10-CM | POA: Diagnosis not present

## 2019-05-28 DIAGNOSIS — M6281 Muscle weakness (generalized): Secondary | ICD-10-CM | POA: Diagnosis not present

## 2019-05-31 DIAGNOSIS — K209 Esophagitis, unspecified without bleeding: Secondary | ICD-10-CM | POA: Diagnosis not present

## 2019-05-31 DIAGNOSIS — R0789 Other chest pain: Secondary | ICD-10-CM | POA: Diagnosis not present

## 2019-05-31 DIAGNOSIS — D649 Anemia, unspecified: Secondary | ICD-10-CM | POA: Diagnosis not present

## 2019-05-31 DIAGNOSIS — I480 Paroxysmal atrial fibrillation: Secondary | ICD-10-CM | POA: Diagnosis not present

## 2019-05-31 DIAGNOSIS — I4891 Unspecified atrial fibrillation: Secondary | ICD-10-CM | POA: Diagnosis not present

## 2019-05-31 DIAGNOSIS — R1310 Dysphagia, unspecified: Secondary | ICD-10-CM | POA: Diagnosis not present

## 2019-05-31 DIAGNOSIS — Z7901 Long term (current) use of anticoagulants: Secondary | ICD-10-CM | POA: Diagnosis not present

## 2019-05-31 DIAGNOSIS — R262 Difficulty in walking, not elsewhere classified: Secondary | ICD-10-CM | POA: Diagnosis not present

## 2019-05-31 DIAGNOSIS — R791 Abnormal coagulation profile: Secondary | ICD-10-CM | POA: Diagnosis not present

## 2019-05-31 DIAGNOSIS — M6281 Muscle weakness (generalized): Secondary | ICD-10-CM | POA: Diagnosis not present

## 2019-06-01 ENCOUNTER — Ambulatory Visit: Payer: Medicare HMO | Admitting: Orthopaedic Surgery

## 2019-06-01 ENCOUNTER — Encounter: Payer: Self-pay | Admitting: Orthopaedic Surgery

## 2019-06-01 DIAGNOSIS — I480 Paroxysmal atrial fibrillation: Secondary | ICD-10-CM | POA: Diagnosis not present

## 2019-06-01 DIAGNOSIS — M6281 Muscle weakness (generalized): Secondary | ICD-10-CM | POA: Diagnosis not present

## 2019-06-01 DIAGNOSIS — R1310 Dysphagia, unspecified: Secondary | ICD-10-CM | POA: Diagnosis not present

## 2019-06-01 DIAGNOSIS — R262 Difficulty in walking, not elsewhere classified: Secondary | ICD-10-CM | POA: Diagnosis not present

## 2019-06-01 DIAGNOSIS — R0789 Other chest pain: Secondary | ICD-10-CM | POA: Diagnosis not present

## 2019-06-01 DIAGNOSIS — K209 Esophagitis, unspecified without bleeding: Secondary | ICD-10-CM | POA: Diagnosis not present

## 2019-06-02 DIAGNOSIS — I480 Paroxysmal atrial fibrillation: Secondary | ICD-10-CM | POA: Diagnosis not present

## 2019-06-02 DIAGNOSIS — K209 Esophagitis, unspecified without bleeding: Secondary | ICD-10-CM | POA: Diagnosis not present

## 2019-06-02 DIAGNOSIS — Z20828 Contact with and (suspected) exposure to other viral communicable diseases: Secondary | ICD-10-CM | POA: Diagnosis not present

## 2019-06-02 DIAGNOSIS — R1310 Dysphagia, unspecified: Secondary | ICD-10-CM | POA: Diagnosis not present

## 2019-06-02 DIAGNOSIS — R262 Difficulty in walking, not elsewhere classified: Secondary | ICD-10-CM | POA: Diagnosis not present

## 2019-06-02 DIAGNOSIS — M6281 Muscle weakness (generalized): Secondary | ICD-10-CM | POA: Diagnosis not present

## 2019-06-02 DIAGNOSIS — R0789 Other chest pain: Secondary | ICD-10-CM | POA: Diagnosis not present

## 2019-06-03 ENCOUNTER — Ambulatory Visit: Payer: Medicare HMO | Admitting: Orthopaedic Surgery

## 2019-06-03 DIAGNOSIS — Z7901 Long term (current) use of anticoagulants: Secondary | ICD-10-CM | POA: Diagnosis not present

## 2019-06-03 DIAGNOSIS — R0789 Other chest pain: Secondary | ICD-10-CM | POA: Diagnosis not present

## 2019-06-03 DIAGNOSIS — R262 Difficulty in walking, not elsewhere classified: Secondary | ICD-10-CM | POA: Diagnosis not present

## 2019-06-03 DIAGNOSIS — I480 Paroxysmal atrial fibrillation: Secondary | ICD-10-CM | POA: Diagnosis not present

## 2019-06-03 DIAGNOSIS — M6281 Muscle weakness (generalized): Secondary | ICD-10-CM | POA: Diagnosis not present

## 2019-06-03 DIAGNOSIS — R1310 Dysphagia, unspecified: Secondary | ICD-10-CM | POA: Diagnosis not present

## 2019-06-03 DIAGNOSIS — I4891 Unspecified atrial fibrillation: Secondary | ICD-10-CM | POA: Diagnosis not present

## 2019-06-03 DIAGNOSIS — Z5181 Encounter for therapeutic drug level monitoring: Secondary | ICD-10-CM | POA: Diagnosis not present

## 2019-06-03 DIAGNOSIS — K209 Esophagitis, unspecified without bleeding: Secondary | ICD-10-CM | POA: Diagnosis not present

## 2019-06-04 DIAGNOSIS — R0789 Other chest pain: Secondary | ICD-10-CM | POA: Diagnosis not present

## 2019-06-04 DIAGNOSIS — R1310 Dysphagia, unspecified: Secondary | ICD-10-CM | POA: Diagnosis not present

## 2019-06-04 DIAGNOSIS — M6281 Muscle weakness (generalized): Secondary | ICD-10-CM | POA: Diagnosis not present

## 2019-06-04 DIAGNOSIS — R262 Difficulty in walking, not elsewhere classified: Secondary | ICD-10-CM | POA: Diagnosis not present

## 2019-06-04 DIAGNOSIS — K209 Esophagitis, unspecified without bleeding: Secondary | ICD-10-CM | POA: Diagnosis not present

## 2019-06-04 DIAGNOSIS — I480 Paroxysmal atrial fibrillation: Secondary | ICD-10-CM | POA: Diagnosis not present

## 2019-06-07 DIAGNOSIS — R262 Difficulty in walking, not elsewhere classified: Secondary | ICD-10-CM | POA: Diagnosis not present

## 2019-06-07 DIAGNOSIS — I8290 Acute embolism and thrombosis of unspecified vein: Secondary | ICD-10-CM | POA: Diagnosis not present

## 2019-06-07 DIAGNOSIS — K209 Esophagitis, unspecified without bleeding: Secondary | ICD-10-CM | POA: Diagnosis not present

## 2019-06-07 DIAGNOSIS — Z5181 Encounter for therapeutic drug level monitoring: Secondary | ICD-10-CM | POA: Diagnosis not present

## 2019-06-07 DIAGNOSIS — R1310 Dysphagia, unspecified: Secondary | ICD-10-CM | POA: Diagnosis not present

## 2019-06-07 DIAGNOSIS — J45909 Unspecified asthma, uncomplicated: Secondary | ICD-10-CM | POA: Diagnosis not present

## 2019-06-07 DIAGNOSIS — R0789 Other chest pain: Secondary | ICD-10-CM | POA: Diagnosis not present

## 2019-06-07 DIAGNOSIS — I480 Paroxysmal atrial fibrillation: Secondary | ICD-10-CM | POA: Diagnosis not present

## 2019-06-07 DIAGNOSIS — Z7901 Long term (current) use of anticoagulants: Secondary | ICD-10-CM | POA: Diagnosis not present

## 2019-06-07 DIAGNOSIS — M6281 Muscle weakness (generalized): Secondary | ICD-10-CM | POA: Diagnosis not present

## 2019-06-07 DIAGNOSIS — I4891 Unspecified atrial fibrillation: Secondary | ICD-10-CM | POA: Diagnosis not present

## 2019-06-08 DIAGNOSIS — R0789 Other chest pain: Secondary | ICD-10-CM | POA: Diagnosis not present

## 2019-06-08 DIAGNOSIS — R262 Difficulty in walking, not elsewhere classified: Secondary | ICD-10-CM | POA: Diagnosis not present

## 2019-06-08 DIAGNOSIS — M6281 Muscle weakness (generalized): Secondary | ICD-10-CM | POA: Diagnosis not present

## 2019-06-08 DIAGNOSIS — R1310 Dysphagia, unspecified: Secondary | ICD-10-CM | POA: Diagnosis not present

## 2019-06-08 DIAGNOSIS — I480 Paroxysmal atrial fibrillation: Secondary | ICD-10-CM | POA: Diagnosis not present

## 2019-06-08 DIAGNOSIS — K209 Esophagitis, unspecified without bleeding: Secondary | ICD-10-CM | POA: Diagnosis not present

## 2019-06-09 DIAGNOSIS — K209 Esophagitis, unspecified without bleeding: Secondary | ICD-10-CM | POA: Diagnosis not present

## 2019-06-09 DIAGNOSIS — R1310 Dysphagia, unspecified: Secondary | ICD-10-CM | POA: Diagnosis not present

## 2019-06-09 DIAGNOSIS — I480 Paroxysmal atrial fibrillation: Secondary | ICD-10-CM | POA: Diagnosis not present

## 2019-06-09 DIAGNOSIS — M6281 Muscle weakness (generalized): Secondary | ICD-10-CM | POA: Diagnosis not present

## 2019-06-09 DIAGNOSIS — R0789 Other chest pain: Secondary | ICD-10-CM | POA: Diagnosis not present

## 2019-06-09 DIAGNOSIS — R262 Difficulty in walking, not elsewhere classified: Secondary | ICD-10-CM | POA: Diagnosis not present

## 2019-06-10 DIAGNOSIS — Z79899 Other long term (current) drug therapy: Secondary | ICD-10-CM | POA: Diagnosis not present

## 2019-06-10 DIAGNOSIS — D649 Anemia, unspecified: Secondary | ICD-10-CM | POA: Diagnosis not present

## 2019-06-10 DIAGNOSIS — R7989 Other specified abnormal findings of blood chemistry: Secondary | ICD-10-CM | POA: Diagnosis not present

## 2019-06-10 DIAGNOSIS — R0989 Other specified symptoms and signs involving the circulatory and respiratory systems: Secondary | ICD-10-CM | POA: Diagnosis not present

## 2019-06-10 DIAGNOSIS — R110 Nausea: Secondary | ICD-10-CM | POA: Diagnosis not present

## 2019-06-10 DIAGNOSIS — R1310 Dysphagia, unspecified: Secondary | ICD-10-CM | POA: Diagnosis not present

## 2019-06-10 DIAGNOSIS — R69 Illness, unspecified: Secondary | ICD-10-CM | POA: Diagnosis not present

## 2019-06-10 DIAGNOSIS — Z20828 Contact with and (suspected) exposure to other viral communicable diseases: Secondary | ICD-10-CM | POA: Diagnosis not present

## 2019-06-10 DIAGNOSIS — Z7901 Long term (current) use of anticoagulants: Secondary | ICD-10-CM | POA: Diagnosis not present

## 2019-06-10 DIAGNOSIS — I4891 Unspecified atrial fibrillation: Secondary | ICD-10-CM | POA: Diagnosis not present

## 2019-06-10 DIAGNOSIS — Z5181 Encounter for therapeutic drug level monitoring: Secondary | ICD-10-CM | POA: Diagnosis not present

## 2019-06-11 DIAGNOSIS — R0789 Other chest pain: Secondary | ICD-10-CM | POA: Diagnosis not present

## 2019-06-11 DIAGNOSIS — R001 Bradycardia, unspecified: Secondary | ICD-10-CM | POA: Diagnosis not present

## 2019-06-11 DIAGNOSIS — I480 Paroxysmal atrial fibrillation: Secondary | ICD-10-CM | POA: Diagnosis not present

## 2019-06-11 DIAGNOSIS — M6281 Muscle weakness (generalized): Secondary | ICD-10-CM | POA: Diagnosis not present

## 2019-06-11 DIAGNOSIS — R262 Difficulty in walking, not elsewhere classified: Secondary | ICD-10-CM | POA: Diagnosis not present

## 2019-06-11 DIAGNOSIS — R1310 Dysphagia, unspecified: Secondary | ICD-10-CM | POA: Diagnosis not present

## 2019-06-11 DIAGNOSIS — K209 Esophagitis, unspecified without bleeding: Secondary | ICD-10-CM | POA: Diagnosis not present

## 2019-06-14 DIAGNOSIS — Z5181 Encounter for therapeutic drug level monitoring: Secondary | ICD-10-CM | POA: Diagnosis not present

## 2019-06-14 DIAGNOSIS — M6281 Muscle weakness (generalized): Secondary | ICD-10-CM | POA: Diagnosis not present

## 2019-06-14 DIAGNOSIS — R1310 Dysphagia, unspecified: Secondary | ICD-10-CM | POA: Diagnosis not present

## 2019-06-14 DIAGNOSIS — Z7901 Long term (current) use of anticoagulants: Secondary | ICD-10-CM | POA: Diagnosis not present

## 2019-06-14 DIAGNOSIS — R0789 Other chest pain: Secondary | ICD-10-CM | POA: Diagnosis not present

## 2019-06-14 DIAGNOSIS — K209 Esophagitis, unspecified without bleeding: Secondary | ICD-10-CM | POA: Diagnosis not present

## 2019-06-14 DIAGNOSIS — R110 Nausea: Secondary | ICD-10-CM | POA: Diagnosis not present

## 2019-06-14 DIAGNOSIS — I4891 Unspecified atrial fibrillation: Secondary | ICD-10-CM | POA: Diagnosis not present

## 2019-06-14 DIAGNOSIS — R262 Difficulty in walking, not elsewhere classified: Secondary | ICD-10-CM | POA: Diagnosis not present

## 2019-06-14 DIAGNOSIS — I480 Paroxysmal atrial fibrillation: Secondary | ICD-10-CM | POA: Diagnosis not present

## 2019-06-15 ENCOUNTER — Ambulatory Visit: Payer: Medicare HMO | Admitting: Orthopaedic Surgery

## 2019-06-15 DIAGNOSIS — R0789 Other chest pain: Secondary | ICD-10-CM | POA: Diagnosis not present

## 2019-06-15 DIAGNOSIS — R69 Illness, unspecified: Secondary | ICD-10-CM | POA: Diagnosis not present

## 2019-06-15 DIAGNOSIS — M6281 Muscle weakness (generalized): Secondary | ICD-10-CM | POA: Diagnosis not present

## 2019-06-15 DIAGNOSIS — R1310 Dysphagia, unspecified: Secondary | ICD-10-CM | POA: Diagnosis not present

## 2019-06-15 DIAGNOSIS — I480 Paroxysmal atrial fibrillation: Secondary | ICD-10-CM | POA: Diagnosis not present

## 2019-06-15 DIAGNOSIS — R262 Difficulty in walking, not elsewhere classified: Secondary | ICD-10-CM | POA: Diagnosis not present

## 2019-06-15 DIAGNOSIS — K209 Esophagitis, unspecified without bleeding: Secondary | ICD-10-CM | POA: Diagnosis not present

## 2019-06-16 DIAGNOSIS — R1310 Dysphagia, unspecified: Secondary | ICD-10-CM | POA: Diagnosis not present

## 2019-06-16 DIAGNOSIS — R0789 Other chest pain: Secondary | ICD-10-CM | POA: Diagnosis not present

## 2019-06-16 DIAGNOSIS — I480 Paroxysmal atrial fibrillation: Secondary | ICD-10-CM | POA: Diagnosis not present

## 2019-06-16 DIAGNOSIS — R262 Difficulty in walking, not elsewhere classified: Secondary | ICD-10-CM | POA: Diagnosis not present

## 2019-06-16 DIAGNOSIS — M6281 Muscle weakness (generalized): Secondary | ICD-10-CM | POA: Diagnosis not present

## 2019-06-16 DIAGNOSIS — K209 Esophagitis, unspecified without bleeding: Secondary | ICD-10-CM | POA: Diagnosis not present

## 2019-06-19 DIAGNOSIS — Z20828 Contact with and (suspected) exposure to other viral communicable diseases: Secondary | ICD-10-CM | POA: Diagnosis not present

## 2019-06-19 DIAGNOSIS — R1310 Dysphagia, unspecified: Secondary | ICD-10-CM | POA: Diagnosis not present

## 2019-06-19 DIAGNOSIS — K209 Esophagitis, unspecified without bleeding: Secondary | ICD-10-CM | POA: Diagnosis not present

## 2019-06-19 DIAGNOSIS — R262 Difficulty in walking, not elsewhere classified: Secondary | ICD-10-CM | POA: Diagnosis not present

## 2019-06-19 DIAGNOSIS — M6281 Muscle weakness (generalized): Secondary | ICD-10-CM | POA: Diagnosis not present

## 2019-06-19 DIAGNOSIS — R0789 Other chest pain: Secondary | ICD-10-CM | POA: Diagnosis not present

## 2019-06-19 DIAGNOSIS — I480 Paroxysmal atrial fibrillation: Secondary | ICD-10-CM | POA: Diagnosis not present

## 2019-06-20 DIAGNOSIS — K209 Esophagitis, unspecified without bleeding: Secondary | ICD-10-CM | POA: Diagnosis not present

## 2019-06-20 DIAGNOSIS — I480 Paroxysmal atrial fibrillation: Secondary | ICD-10-CM | POA: Diagnosis not present

## 2019-06-20 DIAGNOSIS — R262 Difficulty in walking, not elsewhere classified: Secondary | ICD-10-CM | POA: Diagnosis not present

## 2019-06-20 DIAGNOSIS — R0789 Other chest pain: Secondary | ICD-10-CM | POA: Diagnosis not present

## 2019-06-20 DIAGNOSIS — R1310 Dysphagia, unspecified: Secondary | ICD-10-CM | POA: Diagnosis not present

## 2019-06-20 DIAGNOSIS — M6281 Muscle weakness (generalized): Secondary | ICD-10-CM | POA: Diagnosis not present

## 2019-06-21 DIAGNOSIS — I4891 Unspecified atrial fibrillation: Secondary | ICD-10-CM | POA: Diagnosis not present

## 2019-06-21 DIAGNOSIS — R262 Difficulty in walking, not elsewhere classified: Secondary | ICD-10-CM | POA: Diagnosis not present

## 2019-06-21 DIAGNOSIS — Z5181 Encounter for therapeutic drug level monitoring: Secondary | ICD-10-CM | POA: Diagnosis not present

## 2019-06-21 DIAGNOSIS — R0789 Other chest pain: Secondary | ICD-10-CM | POA: Diagnosis not present

## 2019-06-21 DIAGNOSIS — Z7901 Long term (current) use of anticoagulants: Secondary | ICD-10-CM | POA: Diagnosis not present

## 2019-06-21 DIAGNOSIS — Z79899 Other long term (current) drug therapy: Secondary | ICD-10-CM | POA: Diagnosis not present

## 2019-06-21 DIAGNOSIS — Z20828 Contact with and (suspected) exposure to other viral communicable diseases: Secondary | ICD-10-CM | POA: Diagnosis not present

## 2019-06-21 DIAGNOSIS — K209 Esophagitis, unspecified without bleeding: Secondary | ICD-10-CM | POA: Diagnosis not present

## 2019-06-21 DIAGNOSIS — I480 Paroxysmal atrial fibrillation: Secondary | ICD-10-CM | POA: Diagnosis not present

## 2019-06-21 DIAGNOSIS — R1310 Dysphagia, unspecified: Secondary | ICD-10-CM | POA: Diagnosis not present

## 2019-06-21 DIAGNOSIS — J45901 Unspecified asthma with (acute) exacerbation: Secondary | ICD-10-CM | POA: Diagnosis not present

## 2019-06-21 DIAGNOSIS — M6281 Muscle weakness (generalized): Secondary | ICD-10-CM | POA: Diagnosis not present

## 2019-06-21 DIAGNOSIS — J310 Chronic rhinitis: Secondary | ICD-10-CM | POA: Diagnosis not present

## 2019-06-22 ENCOUNTER — Ambulatory Visit: Payer: Medicare HMO | Admitting: Orthopaedic Surgery

## 2019-06-22 DIAGNOSIS — Z79899 Other long term (current) drug therapy: Secondary | ICD-10-CM | POA: Diagnosis not present

## 2019-06-22 DIAGNOSIS — I480 Paroxysmal atrial fibrillation: Secondary | ICD-10-CM | POA: Diagnosis not present

## 2019-06-22 DIAGNOSIS — K209 Esophagitis, unspecified without bleeding: Secondary | ICD-10-CM | POA: Diagnosis not present

## 2019-06-22 DIAGNOSIS — R0789 Other chest pain: Secondary | ICD-10-CM | POA: Diagnosis not present

## 2019-06-22 DIAGNOSIS — R319 Hematuria, unspecified: Secondary | ICD-10-CM | POA: Diagnosis not present

## 2019-06-22 DIAGNOSIS — M6281 Muscle weakness (generalized): Secondary | ICD-10-CM | POA: Diagnosis not present

## 2019-06-22 DIAGNOSIS — N390 Urinary tract infection, site not specified: Secondary | ICD-10-CM | POA: Diagnosis not present

## 2019-06-22 DIAGNOSIS — R262 Difficulty in walking, not elsewhere classified: Secondary | ICD-10-CM | POA: Diagnosis not present

## 2019-06-22 DIAGNOSIS — R1310 Dysphagia, unspecified: Secondary | ICD-10-CM | POA: Diagnosis not present

## 2019-06-23 DIAGNOSIS — R1310 Dysphagia, unspecified: Secondary | ICD-10-CM | POA: Diagnosis not present

## 2019-06-23 DIAGNOSIS — R0789 Other chest pain: Secondary | ICD-10-CM | POA: Diagnosis not present

## 2019-06-23 DIAGNOSIS — Z7901 Long term (current) use of anticoagulants: Secondary | ICD-10-CM | POA: Diagnosis not present

## 2019-06-23 DIAGNOSIS — K209 Esophagitis, unspecified without bleeding: Secondary | ICD-10-CM | POA: Diagnosis not present

## 2019-06-23 DIAGNOSIS — R262 Difficulty in walking, not elsewhere classified: Secondary | ICD-10-CM | POA: Diagnosis not present

## 2019-06-23 DIAGNOSIS — I4891 Unspecified atrial fibrillation: Secondary | ICD-10-CM | POA: Diagnosis not present

## 2019-06-23 DIAGNOSIS — M6281 Muscle weakness (generalized): Secondary | ICD-10-CM | POA: Diagnosis not present

## 2019-06-23 DIAGNOSIS — I480 Paroxysmal atrial fibrillation: Secondary | ICD-10-CM | POA: Diagnosis not present

## 2019-06-24 DIAGNOSIS — R0789 Other chest pain: Secondary | ICD-10-CM | POA: Diagnosis not present

## 2019-06-24 DIAGNOSIS — R262 Difficulty in walking, not elsewhere classified: Secondary | ICD-10-CM | POA: Diagnosis not present

## 2019-06-24 DIAGNOSIS — I480 Paroxysmal atrial fibrillation: Secondary | ICD-10-CM | POA: Diagnosis not present

## 2019-06-24 DIAGNOSIS — K209 Esophagitis, unspecified without bleeding: Secondary | ICD-10-CM | POA: Diagnosis not present

## 2019-06-24 DIAGNOSIS — M6281 Muscle weakness (generalized): Secondary | ICD-10-CM | POA: Diagnosis not present

## 2019-06-24 DIAGNOSIS — R1310 Dysphagia, unspecified: Secondary | ICD-10-CM | POA: Diagnosis not present

## 2019-06-24 DIAGNOSIS — I4891 Unspecified atrial fibrillation: Secondary | ICD-10-CM | POA: Diagnosis not present

## 2019-06-24 DIAGNOSIS — Z20828 Contact with and (suspected) exposure to other viral communicable diseases: Secondary | ICD-10-CM | POA: Diagnosis not present

## 2019-06-24 DIAGNOSIS — Z7901 Long term (current) use of anticoagulants: Secondary | ICD-10-CM | POA: Diagnosis not present

## 2019-06-28 DIAGNOSIS — M6281 Muscle weakness (generalized): Secondary | ICD-10-CM | POA: Diagnosis not present

## 2019-06-28 DIAGNOSIS — Z7901 Long term (current) use of anticoagulants: Secondary | ICD-10-CM | POA: Diagnosis not present

## 2019-06-28 DIAGNOSIS — U071 COVID-19: Secondary | ICD-10-CM | POA: Diagnosis not present

## 2019-06-28 DIAGNOSIS — I4891 Unspecified atrial fibrillation: Secondary | ICD-10-CM | POA: Diagnosis not present

## 2019-06-28 DIAGNOSIS — I8290 Acute embolism and thrombosis of unspecified vein: Secondary | ICD-10-CM | POA: Diagnosis not present

## 2019-06-28 DIAGNOSIS — Z20828 Contact with and (suspected) exposure to other viral communicable diseases: Secondary | ICD-10-CM | POA: Diagnosis not present

## 2019-06-28 DIAGNOSIS — R262 Difficulty in walking, not elsewhere classified: Secondary | ICD-10-CM | POA: Diagnosis not present

## 2019-06-28 DIAGNOSIS — R0602 Shortness of breath: Secondary | ICD-10-CM | POA: Diagnosis not present

## 2019-06-28 DIAGNOSIS — Z5181 Encounter for therapeutic drug level monitoring: Secondary | ICD-10-CM | POA: Diagnosis not present

## 2019-06-28 DIAGNOSIS — I480 Paroxysmal atrial fibrillation: Secondary | ICD-10-CM | POA: Diagnosis not present

## 2019-06-29 DIAGNOSIS — M6281 Muscle weakness (generalized): Secondary | ICD-10-CM | POA: Diagnosis not present

## 2019-06-29 DIAGNOSIS — U071 COVID-19: Secondary | ICD-10-CM | POA: Diagnosis not present

## 2019-06-29 DIAGNOSIS — I480 Paroxysmal atrial fibrillation: Secondary | ICD-10-CM | POA: Diagnosis not present

## 2019-06-29 DIAGNOSIS — R262 Difficulty in walking, not elsewhere classified: Secondary | ICD-10-CM | POA: Diagnosis not present

## 2019-07-01 DIAGNOSIS — Z7901 Long term (current) use of anticoagulants: Secondary | ICD-10-CM | POA: Diagnosis not present

## 2019-07-01 DIAGNOSIS — M6281 Muscle weakness (generalized): Secondary | ICD-10-CM | POA: Diagnosis not present

## 2019-07-01 DIAGNOSIS — I480 Paroxysmal atrial fibrillation: Secondary | ICD-10-CM | POA: Diagnosis not present

## 2019-07-01 DIAGNOSIS — I4891 Unspecified atrial fibrillation: Secondary | ICD-10-CM | POA: Diagnosis not present

## 2019-07-01 DIAGNOSIS — Z5181 Encounter for therapeutic drug level monitoring: Secondary | ICD-10-CM | POA: Diagnosis not present

## 2019-07-01 DIAGNOSIS — R262 Difficulty in walking, not elsewhere classified: Secondary | ICD-10-CM | POA: Diagnosis not present

## 2019-07-01 DIAGNOSIS — U071 COVID-19: Secondary | ICD-10-CM | POA: Diagnosis not present

## 2019-07-02 DIAGNOSIS — I480 Paroxysmal atrial fibrillation: Secondary | ICD-10-CM | POA: Diagnosis not present

## 2019-07-02 DIAGNOSIS — U071 COVID-19: Secondary | ICD-10-CM | POA: Diagnosis not present

## 2019-07-02 DIAGNOSIS — R262 Difficulty in walking, not elsewhere classified: Secondary | ICD-10-CM | POA: Diagnosis not present

## 2019-07-02 DIAGNOSIS — M6281 Muscle weakness (generalized): Secondary | ICD-10-CM | POA: Diagnosis not present

## 2019-07-05 DIAGNOSIS — I480 Paroxysmal atrial fibrillation: Secondary | ICD-10-CM | POA: Diagnosis not present

## 2019-07-05 DIAGNOSIS — M6281 Muscle weakness (generalized): Secondary | ICD-10-CM | POA: Diagnosis not present

## 2019-07-05 DIAGNOSIS — Z5181 Encounter for therapeutic drug level monitoring: Secondary | ICD-10-CM | POA: Diagnosis not present

## 2019-07-05 DIAGNOSIS — I4891 Unspecified atrial fibrillation: Secondary | ICD-10-CM | POA: Diagnosis not present

## 2019-07-05 DIAGNOSIS — J45901 Unspecified asthma with (acute) exacerbation: Secondary | ICD-10-CM | POA: Diagnosis not present

## 2019-07-05 DIAGNOSIS — R262 Difficulty in walking, not elsewhere classified: Secondary | ICD-10-CM | POA: Diagnosis not present

## 2019-07-05 DIAGNOSIS — Z7901 Long term (current) use of anticoagulants: Secondary | ICD-10-CM | POA: Diagnosis not present

## 2019-07-05 DIAGNOSIS — U071 COVID-19: Secondary | ICD-10-CM | POA: Diagnosis not present

## 2019-07-06 DIAGNOSIS — U071 COVID-19: Secondary | ICD-10-CM | POA: Diagnosis not present

## 2019-07-06 DIAGNOSIS — I480 Paroxysmal atrial fibrillation: Secondary | ICD-10-CM | POA: Diagnosis not present

## 2019-07-06 DIAGNOSIS — I4891 Unspecified atrial fibrillation: Secondary | ICD-10-CM | POA: Diagnosis not present

## 2019-07-06 DIAGNOSIS — R262 Difficulty in walking, not elsewhere classified: Secondary | ICD-10-CM | POA: Diagnosis not present

## 2019-07-06 DIAGNOSIS — M6281 Muscle weakness (generalized): Secondary | ICD-10-CM | POA: Diagnosis not present

## 2019-07-06 DIAGNOSIS — Z7901 Long term (current) use of anticoagulants: Secondary | ICD-10-CM | POA: Diagnosis not present

## 2019-07-07 DIAGNOSIS — U071 COVID-19: Secondary | ICD-10-CM | POA: Diagnosis not present

## 2019-07-07 DIAGNOSIS — I480 Paroxysmal atrial fibrillation: Secondary | ICD-10-CM | POA: Diagnosis not present

## 2019-07-07 DIAGNOSIS — R262 Difficulty in walking, not elsewhere classified: Secondary | ICD-10-CM | POA: Diagnosis not present

## 2019-07-07 DIAGNOSIS — M6281 Muscle weakness (generalized): Secondary | ICD-10-CM | POA: Diagnosis not present

## 2019-07-08 DIAGNOSIS — I4891 Unspecified atrial fibrillation: Secondary | ICD-10-CM | POA: Diagnosis not present

## 2019-07-08 DIAGNOSIS — Z5181 Encounter for therapeutic drug level monitoring: Secondary | ICD-10-CM | POA: Diagnosis not present

## 2019-07-08 DIAGNOSIS — Z7901 Long term (current) use of anticoagulants: Secondary | ICD-10-CM | POA: Diagnosis not present

## 2019-07-09 DIAGNOSIS — U071 COVID-19: Secondary | ICD-10-CM | POA: Diagnosis not present

## 2019-07-09 DIAGNOSIS — R262 Difficulty in walking, not elsewhere classified: Secondary | ICD-10-CM | POA: Diagnosis not present

## 2019-07-09 DIAGNOSIS — I480 Paroxysmal atrial fibrillation: Secondary | ICD-10-CM | POA: Diagnosis not present

## 2019-07-09 DIAGNOSIS — M6281 Muscle weakness (generalized): Secondary | ICD-10-CM | POA: Diagnosis not present

## 2019-07-12 DIAGNOSIS — I4891 Unspecified atrial fibrillation: Secondary | ICD-10-CM | POA: Diagnosis not present

## 2019-07-12 DIAGNOSIS — U071 COVID-19: Secondary | ICD-10-CM | POA: Diagnosis not present

## 2019-07-12 DIAGNOSIS — I480 Paroxysmal atrial fibrillation: Secondary | ICD-10-CM | POA: Diagnosis not present

## 2019-07-12 DIAGNOSIS — Z5181 Encounter for therapeutic drug level monitoring: Secondary | ICD-10-CM | POA: Diagnosis not present

## 2019-07-12 DIAGNOSIS — R262 Difficulty in walking, not elsewhere classified: Secondary | ICD-10-CM | POA: Diagnosis not present

## 2019-07-12 DIAGNOSIS — Z7901 Long term (current) use of anticoagulants: Secondary | ICD-10-CM | POA: Diagnosis not present

## 2019-07-12 DIAGNOSIS — M6281 Muscle weakness (generalized): Secondary | ICD-10-CM | POA: Diagnosis not present

## 2019-07-13 DIAGNOSIS — U071 COVID-19: Secondary | ICD-10-CM | POA: Diagnosis not present

## 2019-07-13 DIAGNOSIS — I480 Paroxysmal atrial fibrillation: Secondary | ICD-10-CM | POA: Diagnosis not present

## 2019-07-13 DIAGNOSIS — R262 Difficulty in walking, not elsewhere classified: Secondary | ICD-10-CM | POA: Diagnosis not present

## 2019-07-13 DIAGNOSIS — M6281 Muscle weakness (generalized): Secondary | ICD-10-CM | POA: Diagnosis not present

## 2019-07-14 DIAGNOSIS — U071 COVID-19: Secondary | ICD-10-CM | POA: Diagnosis not present

## 2019-07-14 DIAGNOSIS — M6281 Muscle weakness (generalized): Secondary | ICD-10-CM | POA: Diagnosis not present

## 2019-07-14 DIAGNOSIS — R262 Difficulty in walking, not elsewhere classified: Secondary | ICD-10-CM | POA: Diagnosis not present

## 2019-07-14 DIAGNOSIS — I480 Paroxysmal atrial fibrillation: Secondary | ICD-10-CM | POA: Diagnosis not present

## 2019-07-16 DIAGNOSIS — M6281 Muscle weakness (generalized): Secondary | ICD-10-CM | POA: Diagnosis not present

## 2019-07-16 DIAGNOSIS — U071 COVID-19: Secondary | ICD-10-CM | POA: Diagnosis not present

## 2019-07-16 DIAGNOSIS — R262 Difficulty in walking, not elsewhere classified: Secondary | ICD-10-CM | POA: Diagnosis not present

## 2019-07-16 DIAGNOSIS — Z5181 Encounter for therapeutic drug level monitoring: Secondary | ICD-10-CM | POA: Diagnosis not present

## 2019-07-16 DIAGNOSIS — Z7901 Long term (current) use of anticoagulants: Secondary | ICD-10-CM | POA: Diagnosis not present

## 2019-07-16 DIAGNOSIS — I480 Paroxysmal atrial fibrillation: Secondary | ICD-10-CM | POA: Diagnosis not present

## 2019-07-17 DIAGNOSIS — I4891 Unspecified atrial fibrillation: Secondary | ICD-10-CM | POA: Diagnosis not present

## 2019-07-17 DIAGNOSIS — Z7901 Long term (current) use of anticoagulants: Secondary | ICD-10-CM | POA: Diagnosis not present

## 2019-07-19 ENCOUNTER — Telehealth: Payer: Self-pay

## 2019-07-19 ENCOUNTER — Telehealth (INDEPENDENT_AMBULATORY_CARE_PROVIDER_SITE_OTHER): Payer: Medicare HMO | Admitting: Cardiovascular Disease

## 2019-07-19 ENCOUNTER — Encounter: Payer: Self-pay | Admitting: Cardiovascular Disease

## 2019-07-19 VITALS — BP 114/66 | HR 70 | Resp 18 | Ht 61.0 in | Wt 223.0 lb

## 2019-07-19 DIAGNOSIS — Z955 Presence of coronary angioplasty implant and graft: Secondary | ICD-10-CM

## 2019-07-19 DIAGNOSIS — I25118 Atherosclerotic heart disease of native coronary artery with other forms of angina pectoris: Secondary | ICD-10-CM

## 2019-07-19 DIAGNOSIS — I480 Paroxysmal atrial fibrillation: Secondary | ICD-10-CM | POA: Diagnosis not present

## 2019-07-19 DIAGNOSIS — E785 Hyperlipidemia, unspecified: Secondary | ICD-10-CM

## 2019-07-19 DIAGNOSIS — M6281 Muscle weakness (generalized): Secondary | ICD-10-CM | POA: Diagnosis not present

## 2019-07-19 DIAGNOSIS — I252 Old myocardial infarction: Secondary | ICD-10-CM

## 2019-07-19 DIAGNOSIS — I10 Essential (primary) hypertension: Secondary | ICD-10-CM

## 2019-07-19 DIAGNOSIS — R262 Difficulty in walking, not elsewhere classified: Secondary | ICD-10-CM | POA: Diagnosis not present

## 2019-07-19 DIAGNOSIS — Z7901 Long term (current) use of anticoagulants: Secondary | ICD-10-CM | POA: Diagnosis not present

## 2019-07-19 DIAGNOSIS — I5032 Chronic diastolic (congestive) heart failure: Secondary | ICD-10-CM

## 2019-07-19 DIAGNOSIS — U071 COVID-19: Secondary | ICD-10-CM | POA: Diagnosis not present

## 2019-07-19 DIAGNOSIS — R791 Abnormal coagulation profile: Secondary | ICD-10-CM | POA: Diagnosis not present

## 2019-07-19 DIAGNOSIS — Z5181 Encounter for therapeutic drug level monitoring: Secondary | ICD-10-CM | POA: Diagnosis not present

## 2019-07-19 NOTE — Progress Notes (Signed)
Virtual Visit via Telephone Note   This visit type was conducted due to national recommendations for restrictions regarding the COVID-19 Pandemic (e.g. social distancing) in an effort to limit this patient's exposure and mitigate transmission in our community.  Due to her co-morbid illnesses, this patient is at least at moderate risk for complications without adequate follow up.  This format is felt to be most appropriate for this patient at this time.  The patient did not have access to video technology/had technical difficulties with video requiring transitioning to audio format only (telephone).  All issues noted in this document were discussed and addressed.  No physical exam could be performed with this format.  Please refer to the patient's chart for her  consent to telehealth for [facility].   Date:  07/19/2019   ID:  [name], [name] July 28, 1940, MRN [id number]  Patient Location: Home Provider Location: Home  PCP:  Mikey Kirschner, MD  Cardiologist:  Kate Sable, MD  Electrophysiologist:  None   Evaluation Performed:  Follow-Up Visit  Chief Complaint:  CAD, PAF  History of Present Illness:    [name] is a 79 y.o. female with a history of coronary disease and non-STEMI with prior percutaneous coronary interventions.  She also has paroxysmal atrial fibrillation, hypertension, and hyperlipidemia.  She was hospitalized in November 2020 for dysphagia/odynophagia and underwent an EGD on 05/16/2019 which demonstrated a benign-appearing esophageal stricture which was dilated.  She resides at [facility]. I spoke with the director of nursing, Dedra Skeens, who relayed questions for me.  She denies chest pain and palpitations. She has sporadic shortness of breath with exertion. She is deconditioned. She denies leg swelling and syncope.  Her appetite is good.  She denies fevers and chills.  She is recovering from [place].   Past Medical History:  Diagnosis Date  . Allergy     . Allergy history unknown   . Arthritis   . ASCVD (arteriosclerotic cardiovascular disease)    stent to mid and proximal left anterior descending in 06/2002;drug eluting stent placed in the second diagnol in 08/2003 after  A  non-st elevation myocardial infarction   . Asthma   . Atrial fibrillation ([place])   . Chronic anticoagulation   . Chronic nausea   . Chronic pain of left knee   . Coronary artery disease   . Current chronic use of systemic steroids 05/11/2016  . Depression   . Diabetes mellitus    no insulin  . Diarrhea    acute  . Diverticulitis of intestine   . FH: colonic polyps    adenomataous  . GERD (gastroesophageal reflux disease)   . History of kidney stones   . Hyperlipidemia    pulmonary embolism 2000 and 09/2008  . Hypertension   . Hypothyroidism   . Insomnia   . Irritable bowel syndrome   . Myocardial infarction ([place])   . Paroxysmal atrial fibrillation ([facility])    normal LV function; episodes occurred in 205 and 09/2007  . Pedal edema   . Peripheral edema   . PONV (postoperative nausea and vomiting)   . Pulmonary embolism ([place])    2000/09/2008  . Rectal bleeding   . Sleep apnea   . Thyroid disease    hypothyroidism  . Tobacco user    stopped   . Venous stasis    Past Surgical History:  Procedure Laterality Date  . ABDOMINAL HYSTERECTOMY    . APPENDECTOMY    . BALLOON DILATION N/A 11/11/2013  Procedure: BALLOON DILATION;  Surgeon: Rogene Houston, MD;  [facility];  Service: Endoscopy;  Laterality: N/A;  . BREAST REDUCTION SURGERY    . BREAST SURGERY    . CHOLECYSTECTOMY    . CLEFT PALATE REPAIR     4 surgeries  . COLONOSCOPY  2011   negative  . COLONOSCOPY N/A 05/10/2014   Procedure: COLONOSCOPY;  Surgeon: Rogene Houston, MD;  [facility];  Service: Endoscopy;  Laterality: N/A;  . ESOPHAGEAL DILATION N/A 03/08/2016   Procedure: ESOPHAGEAL DILATION;  Surgeon: Rogene Houston, MD;  [facility];  Service: Endoscopy;   Laterality: N/A;  . ESOPHAGEAL DILATION N/A 05/16/2019   Procedure: ESOPHAGEAL DILATION;  Surgeon: Danie Binder, MD;  [facility];  Service: Endoscopy;  Laterality: N/A;  . ESOPHAGOGASTRODUODENOSCOPY N/A 11/11/2013   Procedure: ESOPHAGOGASTRODUODENOSCOPY (EGD);  Surgeon: Rogene Houston, MD;  [facility];  Service: Endoscopy;  Laterality: N/A;  200-moved to 100 Ann notified pt  . ESOPHAGOGASTRODUODENOSCOPY N/A 05/09/2014   Procedure: ESOPHAGOGASTRODUODENOSCOPY (EGD);  Surgeon: Rogene Houston, MD;  [facility];  Service: Endoscopy;  Laterality: N/A;  . ESOPHAGOGASTRODUODENOSCOPY N/A 03/08/2016   Procedure: ESOPHAGOGASTRODUODENOSCOPY (EGD);  Surgeon: Rogene Houston, MD;  [facility];  Service: Endoscopy;  Laterality: N/A;  2:20  . ESOPHAGOGASTRODUODENOSCOPY N/A 05/16/2019   Procedure: ESOPHAGOGASTRODUODENOSCOPY (EGD);  Surgeon: Danie Binder, MD;  [facility];  Service: Endoscopy;  Laterality: N/A;  . HEEL SPUR SURGERY    . KNEE SURGERY    . MALONEY DILATION N/A 11/11/2013   Procedure: Venia Minks DILATION;  Surgeon: Rogene Houston, MD;  [facility];  Service: Endoscopy;  Laterality: N/A;  . OOPHORECTOMY    . SAVORY DILATION N/A 11/11/2013   Procedure: SAVORY DILATION;  Surgeon: Rogene Houston, MD;  [facility];  Service: Endoscopy;  Laterality: N/A;  . TONSILLECTOMY       Current Meds  Medication Sig  . acetaminophen (TYLENOL) 500 MG tablet Take 500 mg by mouth daily as needed for headache.  . albuterol (PROVENTIL) (2.5 MG/3ML) 0.083% nebulizer solution Take 3 mLs (2.5 mg total) by nebulization every 6 (six) hours as needed for wheezing or shortness of breath.  Marland Kitchen albuterol (VENTOLIN HFA) 108 (90 Base) MCG/ACT inhaler INHALE 2 PUFFS INTO THE LUNGS 4 TIMES DAILY AS NEEDED FOR WHEEZING.  Marland Kitchen atorvastatin (LIPITOR) 40 MG tablet TAKE ONE TABLET BY MOUTH DAILY. (Patient taking differently: Take 40 mg by mouth every  evening. )  . Cholecalciferol (VITAMIN D3) 1000 units CAPS Take 1 capsule every evening by mouth.   . conjugated estrogens (PREMARIN) vaginal cream Place 1 Applicatorful vaginally 2 (two) times a week. On Mondays and Fridays  . diltiazem (CARDIZEM CD) 120 MG 24 hr capsule Take 1 capsule (120 mg total) by mouth daily.  . diphenoxylate-atropine (LOMOTIL) 2.5-0.025 MG tablet TAKE ONE TABLET BY MOUTH 2 TIMES A DAY AS NEEDED. (Patient taking differently: Take 1 tablet by mouth every 12 (twelve) hours as needed for diarrhea or loose stools. )  . escitalopram (LEXAPRO) 20 MG tablet TAKE ONE TABLET BY MOUTH ONCE DAILY. (Patient taking differently: Take 20 mg by mouth daily. )  . furosemide (LASIX) 40 MG tablet TAKE ONE TABLET BY MOUTH DAILY. (Patient taking differently: Take 40 mg by mouth daily. )  . hydrocortisone cream 1 % Apply 1 application topically 2 (two) times daily.  Marland Kitchen levothyroxine (SYNTHROID) 50 MCG tablet  Take 50 mcg by mouth daily before breakfast.  . lidocaine (XYLOCAINE) 2 % solution Use as directed 10 mLs in the mouth or throat 4 (four) times daily -  with meals and at bedtime.  . magnesium oxide (MAG-OX) 400 MG tablet Take 400 mg by mouth daily.  . meclizine (ANTIVERT) 25 MG tablet Take 1 tablet (25 mg total) by mouth 3 (three) times daily as needed for dizziness. (Patient taking differently: Take 25 mg by mouth 2 (two) times daily. )  . metoCLOPramide (REGLAN) 5 MG tablet Take 1 tablet (5 mg total) by mouth 3 (three) times daily before meals.  . nitroGLYCERIN (NITROLINGUAL) 0.4 MG/SPRAY spray Place 1 spray under the tongue every 5 (five) minutes x 3 doses as needed for chest pain.  . pantoprazole (PROTONIX) 40 MG tablet Take 1 tablet (40 mg total) by mouth 2 (two) times daily before a meal. X 1 month, then once daily forever  . Potassium Chloride ER 20 MEQ TBCR One po daily (Patient taking differently: Take 20 mEq by mouth daily. )  . rosuvastatin (CRESTOR) 20 MG tablet Take 20 mg by mouth  daily.  . simethicone (MYLICON) 789 MG chewable tablet Chew 125 mg by mouth 3 (three) times daily with meals.  . triamcinolone cream (KENALOG) 0.1 % Apply 1 application topically 2 (two) times daily.  Marland Kitchen warfarin (COUMADIN) 5 MG tablet TAKE ONE TABLET BY MOUTH ONCE DAILY. TAKE AS DIRECTED BY DOCTOR. (Patient taking differently: Take 5 mg by mouth every evening. )     Allergies:   Cefzil [cefprozil] and Demerol   Social History   Tobacco Use  . Smoking status: Former Smoker    Packs/day: 2.00    Years: 30.00    Pack years: 60.00    Types: Cigarettes    Start date: 05/05/1987  . Smokeless tobacco: Never Used  Substance Use Topics  . Alcohol use: No    Alcohol/week: 0.0 standard drinks  . Drug use: No     Family Hx: The patient's family history includes Diabetes in her mother; Heart attack in her father and mother; Hyperlipidemia in her mother; Lung cancer in her sister; Lymphoma in her brother. There is no history of Colon cancer.  ROS:   Please see the history of present illness.     All other systems reviewed and are negative.   Prior CV studies:   The following studies were reviewed today:  Echo 12/24/17:  - Left ventricle: The cavity size was normal. Wall thickness was   increased in a pattern of mild LVH. Systolic function was normal.   The estimated ejection fraction was in the range of 60% to 65%.   Wall motion was normal; there were no regional wall motion   abnormalities. Features are consistent with a pseudonormal left   ventricular filling pattern, with concomitant abnormal relaxation   and increased filling pressure (grade 2 diastolic dysfunction).   Doppler parameters are consistent with indeterminate ventricular   filling pressure. - Aortic valve: Moderately calcified annulus. Trileaflet; normal   thickness leaflets. - Mitral valve: Calcified annulus.  Nuclear stress test 10/17/2014:  IMPRESSION: 1. Small region of basal inferolateral ischemia as outlined  above, summed stress score 5.  2. Normal left ventricular wall motion.  3. Left ventricular ejection fraction 78%  4. Overall low-risk stress test findings*.  Labs/Other Tests and Data Reviewed:    EKG:  No ECG reviewed.  Recent Labs: 05/16/2019: ALT 12; TSH 2.686 05/20/2019: Hemoglobin 11.3; Platelets 241 05/21/2019:  BUN 13; Creatinine, Ser 0.76; Magnesium 1.6; Potassium 4.0; Sodium 141   Recent Lipid Panel Lab Results  Component Value Date/Time   CHOL 151 04/14/2017 12:32 PM   TRIG 102 04/14/2017 12:32 PM   HDL 59 04/14/2017 12:32 PM   CHOLHDL 2.6 04/14/2017 12:32 PM   CHOLHDL 3.0 04/08/2014 01:54 PM   LDLCALC 72 04/14/2017 12:32 PM    Wt Readings from Last 3 Encounters:  07/19/19 223 lb (101.2 kg)  05/21/19 236 lb 12.4 oz (107.4 kg)  08/13/18 200 lb (90.7 kg)     Objective:    Vital Signs:  BP 114/66   Pulse 70   Resp 18   Ht 5\' 1"  (1.549 m)   Wt 223 lb (101.2 kg)   SpO2 98%   BMI 42.14 kg/m    VITAL SIGNS:  reviewed  ASSESSMENT & PLAN:    1.  Coronary artery disease with a history of non-STEMI and prior percutaneous coronary interventions: Low risk nuclear stress test in 2016.  Symptomatically stable. Not on aspirin as she is on warfarin.  Continue rosuvastatin.  Heart rate is controlled on long-acting diltiazem.  Hold off on beta-blockers.  2.  Hypertension: Blood pressure is normal.  No changes to therapy.  3.  Paroxysmal atrial fibrillation:  Symptomatically stable.  Continue long-acting diltiazem.  Systemically anticoagulated with warfarin.  No changes to therapy.  4.  Pulmonary embolism: No recurrences.  5.  Hyperlipidemia: Continue rosuvastatin.  6.  Chronic diastolic heart failure: Stable.  Continue Lasix 40 mg daily.    COVID-19 Education: The signs and symptoms of COVID-19 were discussed with the patient and how to seek care for testing (follow up with PCP or arrange E-visit).  The importance of social distancing was discussed  today.  Time:   Today, I have spent 15 minutes with the patient with telehealth technology discussing the above problems.     Medication Adjustments/Labs and Tests Ordered: Current medicines are reviewed at length with the patient today.  Concerns regarding medicines are outlined above.   Tests Ordered: No orders of the defined types were placed in this encounter.   Medication Changes: No orders of the defined types were placed in this encounter.   Follow Up:  Virtual Visit  in 6 month(s)  Signed, Kate Sable, MD  07/19/2019 9:38 AM    [place]

## 2019-07-19 NOTE — Telephone Encounter (Signed)
Virtual Visit Pre-Appointment Phone Call  "(Name), I am calling you today to discuss your upcoming appointment. We are currently trying to limit exposure to the virus that causes COVID-19 by seeing patients at home rather than in the office."  1. "What is the BEST phone number to call the day of the visit?" - include this in appointment notes  2. "Do you have or have access to (through a family member/friend) a smartphone with video capability that we can use for your visit?" a. If yes - list this number in appt notes as "cell" (if different from BEST phone #) and list the appointment type as a VIDEO visit in appointment notes b. If no - list the appointment type as a PHONE visit in appointment notes  3. Confirm consent - "In the setting of the current Covid19 crisis, you are scheduled for a (phone or video) visit with your provider on (date) at (time).  Just as we do with many in-office visits, in order for you to participate in this visit, we must obtain consent.  If you'd like, I can send this to your mychart (if signed up) or email for you to review.  Otherwise, I can obtain your verbal consent now.  All virtual visits are billed to your insurance company just like a normal visit would be.  By agreeing to a virtual visit, we'd like you to understand that the technology does not allow for your provider to perform an examination, and thus may limit your provider's ability to fully assess your condition. If your provider identifies any concerns that need to be evaluated in person, we will make arrangements to do so.  Finally, though the technology is pretty good, we cannot assure that it will always work on either your or our end, and in the setting of a video visit, we may have to convert it to a phone-only visit.  In either situation, we cannot ensure that we have a secure connection.  Are you willing to proceed?" STAFF: Did the patient verbally acknowledge consent to telehealth visit? Document  YES/NO here: Yes 4.   5. Advise patient to be prepared - "Two hours prior to your appointment, go ahead and check your blood pressure, pulse, oxygen saturation, and your weight (if you have the equipment to check those) and write them all down. When your visit starts, your provider will ask you for this information. If you have an Apple Watch or Kardia device, please plan to have heart rate information ready on the day of your appointment. Please have a pen and paper handy nearby the day of the visit as well."  6. Give patient instructions for MyChart download to smartphone OR [email or web address] as below if video visit (depending on what platform provider is using)  7. Inform patient they will receive a phone call 15 minutes prior to their appointment time (may be from unknown caller ID) so they should be prepared to answer    TELEPHONE CALL NOTE  Ahuva P Lukins has been deemed a candidate for a follow-up tele-health visit to limit community exposure during the Covid-19 pandemic. I spoke with the patient via phone to ensure availability of phone/video source, confirm preferred email & phone number, and discuss instructions and expectations.  I reminded Felipa P Fetters to be prepared with any vital sign and/or heart rhythm information that could potentially be obtained via home monitoring, at the time of her visit. I reminded Maribeth P Vu to expect a phone call  prior to her visit.  Bernita Raisin, RN 07/19/2019 9:34 AM   INSTRUCTIONS FOR DOWNLOADING THE MYCHART APP TO SMARTPHONE  - The patient must first make sure to have activated MyChart and know their login information - If Apple, go to [employer] and type in MyChart in the search bar and download the app. If Android, ask patient to go to [employer] and type in [place] in the search bar and download the app. The app is free but as with any other app downloads, their phone may require them to verify saved payment information or  Apple/Android password.  - The patient will need to then log into the app with their MyChart username and password, and select [facility] as their healthcare provider to link the account. When it is time for your visit, go to the MyChart app, find appointments, and click Begin Video Visit. Be sure to Select Allow for your device to access the Microphone and Camera for your visit. You will then be connected, and your provider will be with you shortly.  **If they have any issues connecting, or need assistance please contact MyChart service desk [phone])**  **If using a computer, in order to ensure the best quality for their visit they will need to use either of the following Internet Browsers: [employer], or Google Chrome**  IF USING DOXIMITY or DOXY.ME - The patient will receive a link just prior to their visit by text.     FULL LENGTH CONSENT FOR TELE-HEALTH VISIT   I hereby voluntarily request, consent and authorize [facility] and its employed or contracted physicians, physician assistants, nurse practitioners or other licensed health care professionals (the Practitioner), to provide me with telemedicine health care services (the "Services") as deemed necessary by the treating Practitioner. I acknowledge and consent to receive the Services by the Practitioner via telemedicine. I understand that the telemedicine visit will involve communicating with the Practitioner through live audiovisual communication technology and the disclosure of certain medical information by electronic transmission. I acknowledge that I have been given the opportunity to request an in-person assessment or other available alternative prior to the telemedicine visit and am voluntarily participating in the telemedicine visit.  I understand that I have the right to withhold or withdraw my consent to the use of telemedicine in the course of my care at any time, without affecting my right to future care  or treatment, and that the Practitioner or I may terminate the telemedicine visit at any time. I understand that I have the right to inspect all information obtained and/or recorded in the course of the telemedicine visit and may receive copies of available information for a reasonable fee.  I understand that some of the potential risks of receiving the Services via telemedicine include:  Marland Kitchen Delay or interruption in medical evaluation due to technological equipment failure or disruption; . Information transmitted may not be sufficient (e.g. poor resolution of images) to allow for appropriate medical decision making by the Practitioner; and/or  . In rare instances, security protocols could fail, causing a breach of personal health information.  Furthermore, I acknowledge that it is my responsibility to provide information about my medical history, conditions and care that is complete and accurate to the best of my ability. I acknowledge that Practitioner's advice, recommendations, and/or decision may be based on factors not within their control, such as incomplete or inaccurate data provided by me or distortions of diagnostic images or specimens that may result from  electronic transmissions. I understand that the practice of medicine is not an exact science and that Practitioner makes no warranties or guarantees regarding treatment outcomes. I acknowledge that I will receive a copy of this consent concurrently upon execution via email to the email address I last provided but may also request a printed copy by calling the office of [facility].    I understand that my insurance will be billed for this visit.   I have read or had this consent read to me. . I understand the contents of this consent, which adequately explains the benefits and risks of the Services being provided via telemedicine.  . I have been provided ample opportunity to ask questions regarding this consent and the Services and have had  my questions answered to my satisfaction. . I give my informed consent for the services to be provided through the use of telemedicine in my medical care  By participating in this telemedicine visit I agree to the above.

## 2019-07-19 NOTE — Patient Instructions (Signed)
Medication Instructions:  Your physician recommends that you continue on your current medications as directed. Please refer to the Current Medication list given to you today.  *If you need a refill on your cardiac medications before your next appointment, please call your pharmacy*  Lab Work: NONE If you have labs (blood work) drawn today and your tests are completely normal, you will receive your results only by: Marland Kitchen MyChart Message (if you have MyChart) OR . A paper copy in the mail If you have any lab test that is abnormal or we need to change your treatment, we will call you to review the results.  Testing/Procedures: NONE  Follow-Up: At [facility], you and your health needs are our priority.  As part of our continuing mission to provide you with exceptional heart care, we have created designated Provider Care Teams.  These Care Teams include your primary Cardiologist (physician) and Advanced Practice Providers (APPs -  Physician Assistants and Nurse Practitioners) who all work together to provide you with the care you need, when you need it.  Your next appointment:   6 month(s)  The format for your next appointment:   Virtual Visit   Provider:   Kate Sable, MD  Other Instructions NONE      Thank you for choosing [facility] !

## 2019-07-20 DIAGNOSIS — M6281 Muscle weakness (generalized): Secondary | ICD-10-CM | POA: Diagnosis not present

## 2019-07-20 DIAGNOSIS — R262 Difficulty in walking, not elsewhere classified: Secondary | ICD-10-CM | POA: Diagnosis not present

## 2019-07-20 DIAGNOSIS — U071 COVID-19: Secondary | ICD-10-CM | POA: Diagnosis not present

## 2019-07-20 DIAGNOSIS — I4891 Unspecified atrial fibrillation: Secondary | ICD-10-CM | POA: Diagnosis not present

## 2019-07-20 DIAGNOSIS — I480 Paroxysmal atrial fibrillation: Secondary | ICD-10-CM | POA: Diagnosis not present

## 2019-07-20 DIAGNOSIS — J45909 Unspecified asthma, uncomplicated: Secondary | ICD-10-CM | POA: Diagnosis not present

## 2019-07-20 DIAGNOSIS — Z7901 Long term (current) use of anticoagulants: Secondary | ICD-10-CM | POA: Diagnosis not present

## 2019-07-20 DIAGNOSIS — I8290 Acute embolism and thrombosis of unspecified vein: Secondary | ICD-10-CM | POA: Diagnosis not present

## 2019-07-21 DIAGNOSIS — M6281 Muscle weakness (generalized): Secondary | ICD-10-CM | POA: Diagnosis not present

## 2019-07-21 DIAGNOSIS — U071 COVID-19: Secondary | ICD-10-CM | POA: Diagnosis not present

## 2019-07-21 DIAGNOSIS — I480 Paroxysmal atrial fibrillation: Secondary | ICD-10-CM | POA: Diagnosis not present

## 2019-07-21 DIAGNOSIS — R262 Difficulty in walking, not elsewhere classified: Secondary | ICD-10-CM | POA: Diagnosis not present

## 2019-07-22 DIAGNOSIS — Z7901 Long term (current) use of anticoagulants: Secondary | ICD-10-CM | POA: Diagnosis not present

## 2019-07-22 DIAGNOSIS — Z5181 Encounter for therapeutic drug level monitoring: Secondary | ICD-10-CM | POA: Diagnosis not present

## 2019-07-22 DIAGNOSIS — I4891 Unspecified atrial fibrillation: Secondary | ICD-10-CM | POA: Diagnosis not present

## 2019-07-23 DIAGNOSIS — U071 COVID-19: Secondary | ICD-10-CM | POA: Diagnosis not present

## 2019-07-23 DIAGNOSIS — M6281 Muscle weakness (generalized): Secondary | ICD-10-CM | POA: Diagnosis not present

## 2019-07-23 DIAGNOSIS — R262 Difficulty in walking, not elsewhere classified: Secondary | ICD-10-CM | POA: Diagnosis not present

## 2019-07-23 DIAGNOSIS — I480 Paroxysmal atrial fibrillation: Secondary | ICD-10-CM | POA: Diagnosis not present

## 2019-07-26 DIAGNOSIS — Z5181 Encounter for therapeutic drug level monitoring: Secondary | ICD-10-CM | POA: Diagnosis not present

## 2019-07-26 DIAGNOSIS — I4891 Unspecified atrial fibrillation: Secondary | ICD-10-CM | POA: Diagnosis not present

## 2019-07-26 DIAGNOSIS — R791 Abnormal coagulation profile: Secondary | ICD-10-CM | POA: Diagnosis not present

## 2019-07-26 DIAGNOSIS — Z7901 Long term (current) use of anticoagulants: Secondary | ICD-10-CM | POA: Diagnosis not present

## 2019-07-29 ENCOUNTER — Encounter: Payer: Self-pay | Admitting: Family Medicine

## 2019-07-29 DIAGNOSIS — I4891 Unspecified atrial fibrillation: Secondary | ICD-10-CM | POA: Diagnosis not present

## 2019-07-29 DIAGNOSIS — Z7901 Long term (current) use of anticoagulants: Secondary | ICD-10-CM | POA: Diagnosis not present

## 2019-07-29 DIAGNOSIS — Z5181 Encounter for therapeutic drug level monitoring: Secondary | ICD-10-CM | POA: Diagnosis not present

## 2019-08-02 DIAGNOSIS — I4891 Unspecified atrial fibrillation: Secondary | ICD-10-CM | POA: Diagnosis not present

## 2019-08-02 DIAGNOSIS — N952 Postmenopausal atrophic vaginitis: Secondary | ICD-10-CM | POA: Diagnosis not present

## 2019-08-02 DIAGNOSIS — Z5181 Encounter for therapeutic drug level monitoring: Secondary | ICD-10-CM | POA: Diagnosis not present

## 2019-08-02 DIAGNOSIS — Z7901 Long term (current) use of anticoagulants: Secondary | ICD-10-CM | POA: Diagnosis not present

## 2019-08-03 DIAGNOSIS — Z7901 Long term (current) use of anticoagulants: Secondary | ICD-10-CM | POA: Diagnosis not present

## 2019-08-03 DIAGNOSIS — I4891 Unspecified atrial fibrillation: Secondary | ICD-10-CM | POA: Diagnosis not present

## 2019-08-04 DIAGNOSIS — Z7901 Long term (current) use of anticoagulants: Secondary | ICD-10-CM | POA: Diagnosis not present

## 2019-08-04 DIAGNOSIS — I4891 Unspecified atrial fibrillation: Secondary | ICD-10-CM | POA: Diagnosis not present

## 2019-08-05 DIAGNOSIS — I4891 Unspecified atrial fibrillation: Secondary | ICD-10-CM | POA: Diagnosis not present

## 2019-08-05 DIAGNOSIS — Z7901 Long term (current) use of anticoagulants: Secondary | ICD-10-CM | POA: Diagnosis not present

## 2019-08-09 DIAGNOSIS — N952 Postmenopausal atrophic vaginitis: Secondary | ICD-10-CM | POA: Diagnosis not present

## 2019-08-09 DIAGNOSIS — Z5181 Encounter for therapeutic drug level monitoring: Secondary | ICD-10-CM | POA: Diagnosis not present

## 2019-08-09 DIAGNOSIS — Z7901 Long term (current) use of anticoagulants: Secondary | ICD-10-CM | POA: Diagnosis not present

## 2019-08-09 DIAGNOSIS — I4891 Unspecified atrial fibrillation: Secondary | ICD-10-CM | POA: Diagnosis not present

## 2019-08-10 DIAGNOSIS — R69 Illness, unspecified: Secondary | ICD-10-CM | POA: Diagnosis not present

## 2019-08-11 DIAGNOSIS — Z7901 Long term (current) use of anticoagulants: Secondary | ICD-10-CM | POA: Diagnosis not present

## 2019-08-11 DIAGNOSIS — I4891 Unspecified atrial fibrillation: Secondary | ICD-10-CM | POA: Diagnosis not present

## 2019-08-11 DIAGNOSIS — Z5181 Encounter for therapeutic drug level monitoring: Secondary | ICD-10-CM | POA: Diagnosis not present

## 2019-08-16 DIAGNOSIS — Z5181 Encounter for therapeutic drug level monitoring: Secondary | ICD-10-CM | POA: Diagnosis not present

## 2019-08-16 DIAGNOSIS — Z7901 Long term (current) use of anticoagulants: Secondary | ICD-10-CM | POA: Diagnosis not present

## 2019-08-16 DIAGNOSIS — I4891 Unspecified atrial fibrillation: Secondary | ICD-10-CM | POA: Diagnosis not present

## 2019-08-16 DIAGNOSIS — D649 Anemia, unspecified: Secondary | ICD-10-CM | POA: Diagnosis not present

## 2019-08-16 DIAGNOSIS — R1310 Dysphagia, unspecified: Secondary | ICD-10-CM | POA: Diagnosis not present

## 2019-08-17 DIAGNOSIS — R69 Illness, unspecified: Secondary | ICD-10-CM | POA: Diagnosis not present

## 2019-08-19 DIAGNOSIS — D649 Anemia, unspecified: Secondary | ICD-10-CM | POA: Diagnosis not present

## 2019-08-19 DIAGNOSIS — R195 Other fecal abnormalities: Secondary | ICD-10-CM | POA: Diagnosis not present

## 2019-08-19 DIAGNOSIS — Z5181 Encounter for therapeutic drug level monitoring: Secondary | ICD-10-CM | POA: Diagnosis not present

## 2019-08-19 DIAGNOSIS — I480 Paroxysmal atrial fibrillation: Secondary | ICD-10-CM | POA: Diagnosis not present

## 2019-08-19 DIAGNOSIS — I4891 Unspecified atrial fibrillation: Secondary | ICD-10-CM | POA: Diagnosis not present

## 2019-08-19 DIAGNOSIS — R7989 Other specified abnormal findings of blood chemistry: Secondary | ICD-10-CM | POA: Diagnosis not present

## 2019-08-19 DIAGNOSIS — Z7901 Long term (current) use of anticoagulants: Secondary | ICD-10-CM | POA: Diagnosis not present

## 2019-08-20 DIAGNOSIS — Z1159 Encounter for screening for other viral diseases: Secondary | ICD-10-CM | POA: Diagnosis not present

## 2019-08-20 DIAGNOSIS — A0472 Enterocolitis due to Clostridium difficile, not specified as recurrent: Secondary | ICD-10-CM | POA: Diagnosis not present

## 2019-08-23 DIAGNOSIS — I4891 Unspecified atrial fibrillation: Secondary | ICD-10-CM | POA: Diagnosis not present

## 2019-08-23 DIAGNOSIS — R300 Dysuria: Secondary | ICD-10-CM | POA: Diagnosis not present

## 2019-08-23 DIAGNOSIS — Z7901 Long term (current) use of anticoagulants: Secondary | ICD-10-CM | POA: Diagnosis not present

## 2019-08-23 DIAGNOSIS — Z5181 Encounter for therapeutic drug level monitoring: Secondary | ICD-10-CM | POA: Diagnosis not present

## 2019-08-24 DIAGNOSIS — R69 Illness, unspecified: Secondary | ICD-10-CM | POA: Diagnosis not present

## 2019-08-25 DIAGNOSIS — Z79899 Other long term (current) drug therapy: Secondary | ICD-10-CM | POA: Diagnosis not present

## 2019-08-25 DIAGNOSIS — N390 Urinary tract infection, site not specified: Secondary | ICD-10-CM | POA: Diagnosis not present

## 2019-08-25 DIAGNOSIS — R319 Hematuria, unspecified: Secondary | ICD-10-CM | POA: Diagnosis not present

## 2019-08-26 DIAGNOSIS — N390 Urinary tract infection, site not specified: Secondary | ICD-10-CM | POA: Diagnosis not present

## 2019-08-30 DIAGNOSIS — I4891 Unspecified atrial fibrillation: Secondary | ICD-10-CM | POA: Diagnosis not present

## 2019-08-30 DIAGNOSIS — Z7901 Long term (current) use of anticoagulants: Secondary | ICD-10-CM | POA: Diagnosis not present

## 2019-08-31 DIAGNOSIS — I480 Paroxysmal atrial fibrillation: Secondary | ICD-10-CM | POA: Diagnosis not present

## 2019-08-31 DIAGNOSIS — I4891 Unspecified atrial fibrillation: Secondary | ICD-10-CM | POA: Diagnosis not present

## 2019-08-31 DIAGNOSIS — Z7901 Long term (current) use of anticoagulants: Secondary | ICD-10-CM | POA: Diagnosis not present

## 2019-08-31 DIAGNOSIS — Z5181 Encounter for therapeutic drug level monitoring: Secondary | ICD-10-CM | POA: Diagnosis not present

## 2019-09-01 DIAGNOSIS — I482 Chronic atrial fibrillation, unspecified: Secondary | ICD-10-CM | POA: Diagnosis not present

## 2019-09-03 DIAGNOSIS — I482 Chronic atrial fibrillation, unspecified: Secondary | ICD-10-CM | POA: Diagnosis not present

## 2019-09-03 DIAGNOSIS — Z7901 Long term (current) use of anticoagulants: Secondary | ICD-10-CM | POA: Diagnosis not present

## 2019-09-03 DIAGNOSIS — I480 Paroxysmal atrial fibrillation: Secondary | ICD-10-CM | POA: Diagnosis not present

## 2019-09-03 DIAGNOSIS — Z5181 Encounter for therapeutic drug level monitoring: Secondary | ICD-10-CM | POA: Diagnosis not present

## 2019-09-07 DIAGNOSIS — I480 Paroxysmal atrial fibrillation: Secondary | ICD-10-CM | POA: Diagnosis not present

## 2019-09-07 DIAGNOSIS — I482 Chronic atrial fibrillation, unspecified: Secondary | ICD-10-CM | POA: Diagnosis not present

## 2019-09-07 DIAGNOSIS — Z7901 Long term (current) use of anticoagulants: Secondary | ICD-10-CM | POA: Diagnosis not present

## 2019-09-07 DIAGNOSIS — I8290 Acute embolism and thrombosis of unspecified vein: Secondary | ICD-10-CM | POA: Diagnosis not present

## 2019-09-07 DIAGNOSIS — Z5181 Encounter for therapeutic drug level monitoring: Secondary | ICD-10-CM | POA: Diagnosis not present

## 2019-09-14 DIAGNOSIS — Z7901 Long term (current) use of anticoagulants: Secondary | ICD-10-CM | POA: Diagnosis not present

## 2019-09-14 DIAGNOSIS — I480 Paroxysmal atrial fibrillation: Secondary | ICD-10-CM | POA: Diagnosis not present

## 2019-09-14 DIAGNOSIS — Z5181 Encounter for therapeutic drug level monitoring: Secondary | ICD-10-CM | POA: Diagnosis not present

## 2019-09-14 DIAGNOSIS — I8290 Acute embolism and thrombosis of unspecified vein: Secondary | ICD-10-CM | POA: Diagnosis not present

## 2019-09-14 DIAGNOSIS — I482 Chronic atrial fibrillation, unspecified: Secondary | ICD-10-CM | POA: Diagnosis not present

## 2019-09-14 DIAGNOSIS — R69 Illness, unspecified: Secondary | ICD-10-CM | POA: Diagnosis not present

## 2019-09-16 ENCOUNTER — Other Ambulatory Visit: Payer: Self-pay | Admitting: *Deleted

## 2019-09-21 DIAGNOSIS — I482 Chronic atrial fibrillation, unspecified: Secondary | ICD-10-CM | POA: Diagnosis not present

## 2019-09-21 DIAGNOSIS — Z7901 Long term (current) use of anticoagulants: Secondary | ICD-10-CM | POA: Diagnosis not present

## 2019-09-21 DIAGNOSIS — Z5181 Encounter for therapeutic drug level monitoring: Secondary | ICD-10-CM | POA: Diagnosis not present

## 2019-09-21 DIAGNOSIS — I480 Paroxysmal atrial fibrillation: Secondary | ICD-10-CM | POA: Diagnosis not present

## 2019-09-21 DIAGNOSIS — I8290 Acute embolism and thrombosis of unspecified vein: Secondary | ICD-10-CM | POA: Diagnosis not present

## 2019-09-22 DIAGNOSIS — R410 Disorientation, unspecified: Secondary | ICD-10-CM | POA: Diagnosis not present

## 2019-09-22 DIAGNOSIS — R319 Hematuria, unspecified: Secondary | ICD-10-CM | POA: Diagnosis not present

## 2019-09-22 DIAGNOSIS — N390 Urinary tract infection, site not specified: Secondary | ICD-10-CM | POA: Diagnosis not present

## 2019-09-28 DIAGNOSIS — I482 Chronic atrial fibrillation, unspecified: Secondary | ICD-10-CM | POA: Diagnosis not present

## 2019-09-28 DIAGNOSIS — I480 Paroxysmal atrial fibrillation: Secondary | ICD-10-CM | POA: Diagnosis not present

## 2019-09-28 DIAGNOSIS — I8290 Acute embolism and thrombosis of unspecified vein: Secondary | ICD-10-CM | POA: Diagnosis not present

## 2019-09-28 DIAGNOSIS — Z7901 Long term (current) use of anticoagulants: Secondary | ICD-10-CM | POA: Diagnosis not present

## 2019-09-28 DIAGNOSIS — Z5181 Encounter for therapeutic drug level monitoring: Secondary | ICD-10-CM | POA: Diagnosis not present

## 2019-09-30 DIAGNOSIS — R319 Hematuria, unspecified: Secondary | ICD-10-CM | POA: Diagnosis not present

## 2019-09-30 DIAGNOSIS — R410 Disorientation, unspecified: Secondary | ICD-10-CM | POA: Diagnosis not present

## 2019-09-30 DIAGNOSIS — N390 Urinary tract infection, site not specified: Secondary | ICD-10-CM | POA: Diagnosis not present

## 2019-10-01 DIAGNOSIS — I482 Chronic atrial fibrillation, unspecified: Secondary | ICD-10-CM | POA: Diagnosis not present

## 2019-10-01 DIAGNOSIS — I480 Paroxysmal atrial fibrillation: Secondary | ICD-10-CM | POA: Diagnosis not present

## 2019-10-01 DIAGNOSIS — Z7901 Long term (current) use of anticoagulants: Secondary | ICD-10-CM | POA: Diagnosis not present

## 2019-10-01 DIAGNOSIS — Z5181 Encounter for therapeutic drug level monitoring: Secondary | ICD-10-CM | POA: Diagnosis not present

## 2019-10-01 DIAGNOSIS — I8290 Acute embolism and thrombosis of unspecified vein: Secondary | ICD-10-CM | POA: Diagnosis not present

## 2019-10-05 DIAGNOSIS — I480 Paroxysmal atrial fibrillation: Secondary | ICD-10-CM | POA: Diagnosis not present

## 2019-10-05 DIAGNOSIS — Z5181 Encounter for therapeutic drug level monitoring: Secondary | ICD-10-CM | POA: Diagnosis not present

## 2019-10-05 DIAGNOSIS — Z7901 Long term (current) use of anticoagulants: Secondary | ICD-10-CM | POA: Diagnosis not present

## 2019-10-05 DIAGNOSIS — I8290 Acute embolism and thrombosis of unspecified vein: Secondary | ICD-10-CM | POA: Diagnosis not present

## 2019-10-05 DIAGNOSIS — I482 Chronic atrial fibrillation, unspecified: Secondary | ICD-10-CM | POA: Diagnosis not present

## 2019-10-08 DIAGNOSIS — Z5181 Encounter for therapeutic drug level monitoring: Secondary | ICD-10-CM | POA: Diagnosis not present

## 2019-10-08 DIAGNOSIS — I480 Paroxysmal atrial fibrillation: Secondary | ICD-10-CM | POA: Diagnosis not present

## 2019-10-08 DIAGNOSIS — Z7901 Long term (current) use of anticoagulants: Secondary | ICD-10-CM | POA: Diagnosis not present

## 2019-10-08 DIAGNOSIS — I8290 Acute embolism and thrombosis of unspecified vein: Secondary | ICD-10-CM | POA: Diagnosis not present

## 2019-10-08 DIAGNOSIS — I482 Chronic atrial fibrillation, unspecified: Secondary | ICD-10-CM | POA: Diagnosis not present

## 2019-10-12 DIAGNOSIS — I10 Essential (primary) hypertension: Secondary | ICD-10-CM | POA: Diagnosis not present

## 2019-10-12 DIAGNOSIS — R69 Illness, unspecified: Secondary | ICD-10-CM | POA: Diagnosis not present

## 2019-10-12 DIAGNOSIS — E039 Hypothyroidism, unspecified: Secondary | ICD-10-CM | POA: Diagnosis not present

## 2019-10-12 DIAGNOSIS — M353 Polymyalgia rheumatica: Secondary | ICD-10-CM | POA: Diagnosis not present

## 2019-10-12 DIAGNOSIS — I482 Chronic atrial fibrillation, unspecified: Secondary | ICD-10-CM | POA: Diagnosis not present

## 2019-10-13 DIAGNOSIS — I8290 Acute embolism and thrombosis of unspecified vein: Secondary | ICD-10-CM | POA: Diagnosis not present

## 2019-10-13 DIAGNOSIS — Z7901 Long term (current) use of anticoagulants: Secondary | ICD-10-CM | POA: Diagnosis not present

## 2019-10-13 DIAGNOSIS — Z5181 Encounter for therapeutic drug level monitoring: Secondary | ICD-10-CM | POA: Diagnosis not present

## 2019-10-15 DIAGNOSIS — I482 Chronic atrial fibrillation, unspecified: Secondary | ICD-10-CM | POA: Diagnosis not present

## 2019-10-19 DIAGNOSIS — I482 Chronic atrial fibrillation, unspecified: Secondary | ICD-10-CM | POA: Diagnosis not present

## 2019-10-22 DIAGNOSIS — Z5181 Encounter for therapeutic drug level monitoring: Secondary | ICD-10-CM | POA: Diagnosis not present

## 2019-10-22 DIAGNOSIS — I482 Chronic atrial fibrillation, unspecified: Secondary | ICD-10-CM | POA: Diagnosis not present

## 2019-10-22 DIAGNOSIS — I8290 Acute embolism and thrombosis of unspecified vein: Secondary | ICD-10-CM | POA: Diagnosis not present

## 2019-10-22 DIAGNOSIS — Z7901 Long term (current) use of anticoagulants: Secondary | ICD-10-CM | POA: Diagnosis not present

## 2019-10-26 DIAGNOSIS — Z79899 Other long term (current) drug therapy: Secondary | ICD-10-CM | POA: Diagnosis not present

## 2019-10-26 DIAGNOSIS — I482 Chronic atrial fibrillation, unspecified: Secondary | ICD-10-CM | POA: Diagnosis not present

## 2019-10-26 DIAGNOSIS — I4891 Unspecified atrial fibrillation: Secondary | ICD-10-CM | POA: Diagnosis not present

## 2019-10-29 DIAGNOSIS — I482 Chronic atrial fibrillation, unspecified: Secondary | ICD-10-CM | POA: Diagnosis not present

## 2019-11-01 DIAGNOSIS — I482 Chronic atrial fibrillation, unspecified: Secondary | ICD-10-CM | POA: Diagnosis not present

## 2019-11-01 DIAGNOSIS — Z5181 Encounter for therapeutic drug level monitoring: Secondary | ICD-10-CM | POA: Diagnosis not present

## 2019-11-01 DIAGNOSIS — I8290 Acute embolism and thrombosis of unspecified vein: Secondary | ICD-10-CM | POA: Diagnosis not present

## 2019-11-01 DIAGNOSIS — I480 Paroxysmal atrial fibrillation: Secondary | ICD-10-CM | POA: Diagnosis not present

## 2019-11-05 DIAGNOSIS — I482 Chronic atrial fibrillation, unspecified: Secondary | ICD-10-CM | POA: Diagnosis not present

## 2019-11-08 DIAGNOSIS — Z5181 Encounter for therapeutic drug level monitoring: Secondary | ICD-10-CM | POA: Diagnosis not present

## 2019-11-08 DIAGNOSIS — I480 Paroxysmal atrial fibrillation: Secondary | ICD-10-CM | POA: Diagnosis not present

## 2019-11-08 DIAGNOSIS — Z7901 Long term (current) use of anticoagulants: Secondary | ICD-10-CM | POA: Diagnosis not present

## 2019-11-08 DIAGNOSIS — R791 Abnormal coagulation profile: Secondary | ICD-10-CM | POA: Diagnosis not present

## 2019-11-08 DIAGNOSIS — I4891 Unspecified atrial fibrillation: Secondary | ICD-10-CM | POA: Diagnosis not present

## 2019-11-11 DIAGNOSIS — I480 Paroxysmal atrial fibrillation: Secondary | ICD-10-CM | POA: Diagnosis not present

## 2019-11-11 DIAGNOSIS — Z7901 Long term (current) use of anticoagulants: Secondary | ICD-10-CM | POA: Diagnosis not present

## 2019-11-11 DIAGNOSIS — Z5181 Encounter for therapeutic drug level monitoring: Secondary | ICD-10-CM | POA: Diagnosis not present

## 2019-11-11 DIAGNOSIS — I4891 Unspecified atrial fibrillation: Secondary | ICD-10-CM | POA: Diagnosis not present

## 2019-11-15 DIAGNOSIS — I482 Chronic atrial fibrillation, unspecified: Secondary | ICD-10-CM | POA: Diagnosis not present

## 2019-11-17 DIAGNOSIS — I4891 Unspecified atrial fibrillation: Secondary | ICD-10-CM | POA: Diagnosis not present

## 2019-11-17 DIAGNOSIS — Z7901 Long term (current) use of anticoagulants: Secondary | ICD-10-CM | POA: Diagnosis not present

## 2019-11-23 DIAGNOSIS — M6281 Muscle weakness (generalized): Secondary | ICD-10-CM | POA: Diagnosis not present

## 2019-11-23 DIAGNOSIS — Z8616 Personal history of COVID-19: Secondary | ICD-10-CM | POA: Diagnosis not present

## 2019-11-23 DIAGNOSIS — Z7901 Long term (current) use of anticoagulants: Secondary | ICD-10-CM | POA: Diagnosis not present

## 2019-11-23 DIAGNOSIS — K209 Esophagitis, unspecified without bleeding: Secondary | ICD-10-CM | POA: Diagnosis not present

## 2019-11-23 DIAGNOSIS — R41841 Cognitive communication deficit: Secondary | ICD-10-CM | POA: Diagnosis not present

## 2019-11-23 DIAGNOSIS — R1312 Dysphagia, oropharyngeal phase: Secondary | ICD-10-CM | POA: Diagnosis not present

## 2019-11-23 DIAGNOSIS — I480 Paroxysmal atrial fibrillation: Secondary | ICD-10-CM | POA: Diagnosis not present

## 2019-11-23 DIAGNOSIS — I4891 Unspecified atrial fibrillation: Secondary | ICD-10-CM | POA: Diagnosis not present

## 2019-11-24 DIAGNOSIS — R1312 Dysphagia, oropharyngeal phase: Secondary | ICD-10-CM | POA: Diagnosis not present

## 2019-11-24 DIAGNOSIS — M6281 Muscle weakness (generalized): Secondary | ICD-10-CM | POA: Diagnosis not present

## 2019-11-24 DIAGNOSIS — K209 Esophagitis, unspecified without bleeding: Secondary | ICD-10-CM | POA: Diagnosis not present

## 2019-11-24 DIAGNOSIS — I480 Paroxysmal atrial fibrillation: Secondary | ICD-10-CM | POA: Diagnosis not present

## 2019-11-24 DIAGNOSIS — Z8616 Personal history of COVID-19: Secondary | ICD-10-CM | POA: Diagnosis not present

## 2019-11-24 DIAGNOSIS — R41841 Cognitive communication deficit: Secondary | ICD-10-CM | POA: Diagnosis not present

## 2019-11-25 DIAGNOSIS — I480 Paroxysmal atrial fibrillation: Secondary | ICD-10-CM | POA: Diagnosis not present

## 2019-11-25 DIAGNOSIS — Z8616 Personal history of COVID-19: Secondary | ICD-10-CM | POA: Diagnosis not present

## 2019-11-25 DIAGNOSIS — K209 Esophagitis, unspecified without bleeding: Secondary | ICD-10-CM | POA: Diagnosis not present

## 2019-11-25 DIAGNOSIS — M6281 Muscle weakness (generalized): Secondary | ICD-10-CM | POA: Diagnosis not present

## 2019-11-25 DIAGNOSIS — R1312 Dysphagia, oropharyngeal phase: Secondary | ICD-10-CM | POA: Diagnosis not present

## 2019-11-25 DIAGNOSIS — R41841 Cognitive communication deficit: Secondary | ICD-10-CM | POA: Diagnosis not present

## 2019-11-26 DIAGNOSIS — R1312 Dysphagia, oropharyngeal phase: Secondary | ICD-10-CM | POA: Diagnosis not present

## 2019-11-26 DIAGNOSIS — I480 Paroxysmal atrial fibrillation: Secondary | ICD-10-CM | POA: Diagnosis not present

## 2019-11-26 DIAGNOSIS — K209 Esophagitis, unspecified without bleeding: Secondary | ICD-10-CM | POA: Diagnosis not present

## 2019-11-26 DIAGNOSIS — M6281 Muscle weakness (generalized): Secondary | ICD-10-CM | POA: Diagnosis not present

## 2019-11-26 DIAGNOSIS — R41841 Cognitive communication deficit: Secondary | ICD-10-CM | POA: Diagnosis not present

## 2019-11-26 DIAGNOSIS — Z8616 Personal history of COVID-19: Secondary | ICD-10-CM | POA: Diagnosis not present

## 2019-11-27 DIAGNOSIS — I4891 Unspecified atrial fibrillation: Secondary | ICD-10-CM | POA: Diagnosis not present

## 2019-11-27 DIAGNOSIS — Z7901 Long term (current) use of anticoagulants: Secondary | ICD-10-CM | POA: Diagnosis not present

## 2019-11-28 DIAGNOSIS — Z8616 Personal history of COVID-19: Secondary | ICD-10-CM | POA: Diagnosis not present

## 2019-11-28 DIAGNOSIS — K209 Esophagitis, unspecified without bleeding: Secondary | ICD-10-CM | POA: Diagnosis not present

## 2019-11-28 DIAGNOSIS — R1312 Dysphagia, oropharyngeal phase: Secondary | ICD-10-CM | POA: Diagnosis not present

## 2019-11-28 DIAGNOSIS — R41841 Cognitive communication deficit: Secondary | ICD-10-CM | POA: Diagnosis not present

## 2019-11-28 DIAGNOSIS — I480 Paroxysmal atrial fibrillation: Secondary | ICD-10-CM | POA: Diagnosis not present

## 2019-11-28 DIAGNOSIS — M6281 Muscle weakness (generalized): Secondary | ICD-10-CM | POA: Diagnosis not present

## 2019-11-29 DIAGNOSIS — R41841 Cognitive communication deficit: Secondary | ICD-10-CM | POA: Diagnosis not present

## 2019-11-29 DIAGNOSIS — Z79899 Other long term (current) drug therapy: Secondary | ICD-10-CM | POA: Diagnosis not present

## 2019-11-29 DIAGNOSIS — R791 Abnormal coagulation profile: Secondary | ICD-10-CM | POA: Diagnosis not present

## 2019-11-29 DIAGNOSIS — R1312 Dysphagia, oropharyngeal phase: Secondary | ICD-10-CM | POA: Diagnosis not present

## 2019-11-29 DIAGNOSIS — I480 Paroxysmal atrial fibrillation: Secondary | ICD-10-CM | POA: Diagnosis not present

## 2019-11-29 DIAGNOSIS — M6281 Muscle weakness (generalized): Secondary | ICD-10-CM | POA: Diagnosis not present

## 2019-11-29 DIAGNOSIS — K209 Esophagitis, unspecified without bleeding: Secondary | ICD-10-CM | POA: Diagnosis not present

## 2019-11-29 DIAGNOSIS — Z8616 Personal history of COVID-19: Secondary | ICD-10-CM | POA: Diagnosis not present

## 2019-11-29 DIAGNOSIS — Z5181 Encounter for therapeutic drug level monitoring: Secondary | ICD-10-CM | POA: Diagnosis not present

## 2019-11-30 DIAGNOSIS — M6281 Muscle weakness (generalized): Secondary | ICD-10-CM | POA: Diagnosis not present

## 2019-11-30 DIAGNOSIS — R1312 Dysphagia, oropharyngeal phase: Secondary | ICD-10-CM | POA: Diagnosis not present

## 2019-11-30 DIAGNOSIS — R41841 Cognitive communication deficit: Secondary | ICD-10-CM | POA: Diagnosis not present

## 2019-11-30 DIAGNOSIS — Z8616 Personal history of COVID-19: Secondary | ICD-10-CM | POA: Diagnosis not present

## 2019-11-30 DIAGNOSIS — K209 Esophagitis, unspecified without bleeding: Secondary | ICD-10-CM | POA: Diagnosis not present

## 2019-11-30 DIAGNOSIS — I480 Paroxysmal atrial fibrillation: Secondary | ICD-10-CM | POA: Diagnosis not present

## 2019-12-01 DIAGNOSIS — I480 Paroxysmal atrial fibrillation: Secondary | ICD-10-CM | POA: Diagnosis not present

## 2019-12-01 DIAGNOSIS — R1312 Dysphagia, oropharyngeal phase: Secondary | ICD-10-CM | POA: Diagnosis not present

## 2019-12-01 DIAGNOSIS — M6281 Muscle weakness (generalized): Secondary | ICD-10-CM | POA: Diagnosis not present

## 2019-12-01 DIAGNOSIS — K209 Esophagitis, unspecified without bleeding: Secondary | ICD-10-CM | POA: Diagnosis not present

## 2019-12-01 DIAGNOSIS — Z8616 Personal history of COVID-19: Secondary | ICD-10-CM | POA: Diagnosis not present

## 2019-12-01 DIAGNOSIS — R41841 Cognitive communication deficit: Secondary | ICD-10-CM | POA: Diagnosis not present

## 2019-12-02 DIAGNOSIS — I480 Paroxysmal atrial fibrillation: Secondary | ICD-10-CM | POA: Diagnosis not present

## 2019-12-02 DIAGNOSIS — R41841 Cognitive communication deficit: Secondary | ICD-10-CM | POA: Diagnosis not present

## 2019-12-02 DIAGNOSIS — Z8616 Personal history of COVID-19: Secondary | ICD-10-CM | POA: Diagnosis not present

## 2019-12-02 DIAGNOSIS — I4891 Unspecified atrial fibrillation: Secondary | ICD-10-CM | POA: Diagnosis not present

## 2019-12-02 DIAGNOSIS — K209 Esophagitis, unspecified without bleeding: Secondary | ICD-10-CM | POA: Diagnosis not present

## 2019-12-02 DIAGNOSIS — M6281 Muscle weakness (generalized): Secondary | ICD-10-CM | POA: Diagnosis not present

## 2019-12-02 DIAGNOSIS — Z7901 Long term (current) use of anticoagulants: Secondary | ICD-10-CM | POA: Diagnosis not present

## 2019-12-02 DIAGNOSIS — R1312 Dysphagia, oropharyngeal phase: Secondary | ICD-10-CM | POA: Diagnosis not present

## 2019-12-06 DIAGNOSIS — I4891 Unspecified atrial fibrillation: Secondary | ICD-10-CM | POA: Diagnosis not present

## 2019-12-06 DIAGNOSIS — Z7901 Long term (current) use of anticoagulants: Secondary | ICD-10-CM | POA: Diagnosis not present

## 2019-12-08 DIAGNOSIS — Z7901 Long term (current) use of anticoagulants: Secondary | ICD-10-CM | POA: Diagnosis not present

## 2019-12-08 DIAGNOSIS — I4891 Unspecified atrial fibrillation: Secondary | ICD-10-CM | POA: Diagnosis not present

## 2019-12-10 DIAGNOSIS — I4891 Unspecified atrial fibrillation: Secondary | ICD-10-CM | POA: Diagnosis not present

## 2019-12-10 DIAGNOSIS — Z7901 Long term (current) use of anticoagulants: Secondary | ICD-10-CM | POA: Diagnosis not present

## 2019-12-14 DIAGNOSIS — R69 Illness, unspecified: Secondary | ICD-10-CM | POA: Diagnosis not present

## 2019-12-29 DIAGNOSIS — N390 Urinary tract infection, site not specified: Secondary | ICD-10-CM | POA: Diagnosis not present

## 2019-12-29 DIAGNOSIS — Z79899 Other long term (current) drug therapy: Secondary | ICD-10-CM | POA: Diagnosis not present

## 2019-12-29 DIAGNOSIS — R319 Hematuria, unspecified: Secondary | ICD-10-CM | POA: Diagnosis not present

## 2019-12-29 DIAGNOSIS — R69 Illness, unspecified: Secondary | ICD-10-CM | POA: Diagnosis not present

## 2020-01-06 DIAGNOSIS — E119 Type 2 diabetes mellitus without complications: Secondary | ICD-10-CM | POA: Diagnosis not present

## 2020-01-06 DIAGNOSIS — K2100 Gastro-esophageal reflux disease with esophagitis, without bleeding: Secondary | ICD-10-CM | POA: Diagnosis not present

## 2020-01-06 DIAGNOSIS — E785 Hyperlipidemia, unspecified: Secondary | ICD-10-CM | POA: Diagnosis not present

## 2020-01-06 DIAGNOSIS — R69 Illness, unspecified: Secondary | ICD-10-CM | POA: Diagnosis not present

## 2020-01-06 DIAGNOSIS — I10 Essential (primary) hypertension: Secondary | ICD-10-CM | POA: Diagnosis not present

## 2020-01-06 DIAGNOSIS — I2510 Atherosclerotic heart disease of native coronary artery without angina pectoris: Secondary | ICD-10-CM | POA: Diagnosis not present

## 2020-01-06 DIAGNOSIS — M797 Fibromyalgia: Secondary | ICD-10-CM | POA: Diagnosis not present

## 2020-01-06 DIAGNOSIS — I480 Paroxysmal atrial fibrillation: Secondary | ICD-10-CM | POA: Diagnosis not present

## 2020-01-13 DIAGNOSIS — R630 Anorexia: Secondary | ICD-10-CM | POA: Diagnosis not present

## 2020-01-13 DIAGNOSIS — E785 Hyperlipidemia, unspecified: Secondary | ICD-10-CM | POA: Diagnosis not present

## 2020-01-13 DIAGNOSIS — E119 Type 2 diabetes mellitus without complications: Secondary | ICD-10-CM | POA: Diagnosis not present

## 2020-01-13 DIAGNOSIS — I2510 Atherosclerotic heart disease of native coronary artery without angina pectoris: Secondary | ICD-10-CM | POA: Diagnosis not present

## 2020-01-13 DIAGNOSIS — I10 Essential (primary) hypertension: Secondary | ICD-10-CM | POA: Diagnosis not present

## 2020-01-24 ENCOUNTER — Telehealth: Payer: Medicare HMO | Admitting: Cardiovascular Disease

## 2020-01-24 DIAGNOSIS — R69 Illness, unspecified: Secondary | ICD-10-CM | POA: Diagnosis not present

## 2020-02-01 DIAGNOSIS — E785 Hyperlipidemia, unspecified: Secondary | ICD-10-CM | POA: Diagnosis not present

## 2020-02-01 DIAGNOSIS — R69 Illness, unspecified: Secondary | ICD-10-CM | POA: Diagnosis not present

## 2020-02-01 DIAGNOSIS — I2510 Atherosclerotic heart disease of native coronary artery without angina pectoris: Secondary | ICD-10-CM | POA: Diagnosis not present

## 2020-02-14 DIAGNOSIS — R69 Illness, unspecified: Secondary | ICD-10-CM | POA: Diagnosis not present

## 2020-02-24 ENCOUNTER — Encounter: Payer: Self-pay | Admitting: Cardiology

## 2020-02-24 NOTE — Progress Notes (Signed)
Cardiology Office Note  Date: 02/25/2020   ID: Kayann P Makya, Phill[place] 08-04-1940, MRN [id number]  PCP:  Mikey Kirschner, MD (Inactive)  Cardiologist:  Rozann Lesches, MD Electrophysiologist:  None   Chief Complaint  Patient presents with  . Cardiac follow-up    History of Present Illness: [name] is an 79 y.o. female former patient of Dr. Bronson Ing now presenting to establish follow-up with me.  I reviewed her records and updated the chart.  She was last evaluated in January.  She continues to reside at [place].  She is here with an Environmental consultant today.  She is very hard of hearing, we were able to communicate however.  She is in a wheelchair, not able to ambulate.  She states that she enjoys coloring.  She did not report any significant chest pain or palpitations.  States that she has had some rhinorrhea and probable allergy symptoms.  I went over her medications which are outlined below.  She is on Xarelto at this point with history of paroxysmal atrial fibrillation, also previous pulmonary emboli.  She has not had recent lab work, I reviewed results from November 2020.  No obvious bleeding problems.  Past Medical History:  Diagnosis Date  . Allergy   . Arthritis   . Asthma   . CAD (coronary artery disease)    BMS and DES to LAD 2004; DES to D2 2005  . Chronic pain of left knee   . Depression   . Diverticulitis of intestine   . Essential hypertension   . FH: colonic polyps    Adenomataous  . GERD (gastroesophageal reflux disease)   . History of kidney stones   . Hyperlipidemia   . Hypothyroidism   . Insomnia   . Irritable bowel syndrome   . NSTEMI (non-ST elevated myocardial infarction) ([place])    2005  . Paroxysmal atrial fibrillation (HCC)   . Pulmonary embolism ([place])    2000 and 2010  . Sleep apnea   . Type 2 diabetes mellitus ([place])   . Venous stasis     Past Surgical History:  Procedure Laterality Date  . ABDOMINAL HYSTERECTOMY    . APPENDECTOMY    .  BALLOON DILATION N/A 11/11/2013   Procedure: BALLOON DILATION;  Surgeon: Rogene Houston, MD;  [facility];  Service: Endoscopy;  Laterality: N/A;  . BREAST REDUCTION SURGERY    . BREAST SURGERY    . CHOLECYSTECTOMY    . CLEFT PALATE REPAIR     4 surgeries  . COLONOSCOPY  2011   negative  . COLONOSCOPY N/A 05/10/2014   Procedure: COLONOSCOPY;  Surgeon: Rogene Houston, MD;  [facility];  Service: Endoscopy;  Laterality: N/A;  . ESOPHAGEAL DILATION N/A 03/08/2016   Procedure: ESOPHAGEAL DILATION;  Surgeon: Rogene Houston, MD;  [facility];  Service: Endoscopy;  Laterality: N/A;  . ESOPHAGEAL DILATION N/A 05/16/2019   Procedure: ESOPHAGEAL DILATION;  Surgeon: Danie Binder, MD;  [facility];  Service: Endoscopy;  Laterality: N/A;  . ESOPHAGOGASTRODUODENOSCOPY N/A 11/11/2013   Procedure: ESOPHAGOGASTRODUODENOSCOPY (EGD);  Surgeon: Rogene Houston, MD;  [facility];  Service: Endoscopy;  Laterality: N/A;  200-moved to 100 Ann notified pt  . ESOPHAGOGASTRODUODENOSCOPY N/A 05/09/2014   Procedure: ESOPHAGOGASTRODUODENOSCOPY (EGD);  Surgeon: Rogene Houston, MD;  [facility];  Service: Endoscopy;  Laterality: N/A;  . ESOPHAGOGASTRODUODENOSCOPY N/A 03/08/2016   Procedure: ESOPHAGOGASTRODUODENOSCOPY (EGD);  Surgeon: Rogene Houston,  MD;  [facility];  Service: Endoscopy;  Laterality: N/A;  2:20  . ESOPHAGOGASTRODUODENOSCOPY N/A 05/16/2019   Procedure: ESOPHAGOGASTRODUODENOSCOPY (EGD);  Surgeon: Danie Binder, MD;  [facility];  Service: Endoscopy;  Laterality: N/A;  . HEEL SPUR SURGERY    . KNEE SURGERY    . MALONEY DILATION N/A 11/11/2013   Procedure: Venia Minks DILATION;  Surgeon: Rogene Houston, MD;  [facility];  Service: Endoscopy;  Laterality: N/A;  . OOPHORECTOMY    . SAVORY DILATION N/A 11/11/2013   Procedure: SAVORY DILATION;  Surgeon: Rogene Houston, MD;  [facility];   Service: Endoscopy;  Laterality: N/A;  . TONSILLECTOMY      Current Outpatient Medications  Medication Sig Dispense Refill  . acetaminophen (TYLENOL) 500 MG tablet Take 500 mg by mouth daily as needed for headache.    . albuterol (PROVENTIL) (2.5 MG/3ML) 0.083% nebulizer solution Take 3 mLs (2.5 mg total) by nebulization every 6 (six) hours as needed for wheezing or shortness of breath. 75 mL 5  . albuterol (VENTOLIN HFA) 108 (90 Base) MCG/ACT inhaler INHALE 2 PUFFS INTO THE LUNGS 4 TIMES DAILY AS NEEDED FOR WHEEZING. 18 g 5  . Cholecalciferol (VITAMIN D3) 1000 units CAPS Take 1 capsule every evening by mouth.     . conjugated estrogens (PREMARIN) vaginal cream Place 1 Applicatorful vaginally 2 (two) times a week. On Mondays and Fridays    . diltiazem (CARDIZEM CD) 120 MG 24 hr capsule Take 1 capsule (120 mg total) by mouth daily.    . diphenoxylate-atropine (LOMOTIL) 2.5-0.025 MG tablet TAKE ONE TABLET BY MOUTH 2 TIMES A DAY AS NEEDED. 60 tablet 0  . escitalopram (LEXAPRO) 20 MG tablet TAKE ONE TABLET BY MOUTH ONCE DAILY. 30 tablet 0  . FLOVENT DISKUS 100 MCG/BLIST AEPB Inhale 2 puffs into the lungs in the morning and at bedtime.    . furosemide (LASIX) 40 MG tablet TAKE ONE TABLET BY MOUTH DAILY. 30 tablet 0  . hydrocortisone cream 1 % Apply 1 application topically 2 (two) times daily.    Marland Kitchen levothyroxine (SYNTHROID) 50 MCG tablet Take 50 mcg by mouth daily before breakfast.    . lidocaine (XYLOCAINE) 2 % solution Use as directed 10 mLs in the mouth or throat 4 (four) times daily -  with meals and at bedtime.  0  . magnesium oxide (MAG-OX) 400 MG tablet Take 400 mg by mouth daily.    . meclizine (ANTIVERT) 25 MG tablet Take 1 tablet (25 mg total) by mouth 3 (three) times daily as needed for dizziness. 30 tablet 0  . metoCLOPramide (REGLAN) 5 MG tablet Take 1 tablet (5 mg total) by mouth 3 (three) times daily before meals. 90 tablet 0  . nitroGLYCERIN (NITROLINGUAL) 0.4 MG/SPRAY spray Place 1  spray under the tongue every 5 (five) minutes x 3 doses as needed for chest pain. 12 g 3  . pantoprazole (PROTONIX) 40 MG tablet Take 1 tablet (40 mg total) by mouth 2 (two) times daily before a meal. X 1 month, then once daily forever 60 tablet 0  . Potassium Chloride ER 20 MEQ TBCR One po daily 90 tablet 1  . rivaroxaban (XARELTO) 20 MG TABS tablet Take 20 mg by mouth daily with supper.    . rosuvastatin (CRESTOR) 20 MG tablet Take 20 mg by mouth daily.    . simethicone (MYLICON) 856 MG chewable tablet Chew 125 mg by mouth 3 (three) times daily with  meals.    . triamcinolone cream (KENALOG) 0.1 % Apply 1 application topically 2 (two) times daily. 60 g 6   No current facility-administered medications for this visit.   Allergies:  Cefzil [cefprozil] and Demerol   ROS:   Hearing loss.  Physical Exam: VS:  BP 115/79   Pulse 67   SpO2 96% , BMI There is no height or weight on file to calculate BMI.  Wt Readings from Last 3 Encounters:  07/19/19 223 lb (101.2 kg)  05/21/19 236 lb 12.4 oz (107.4 kg)  08/13/18 200 lb (90.7 kg)    General: Elderly woman seated in wheelchair.  Appears comfortable. HEENT: Conjunctiva and lids normal, wearing a mask. Neck: Supple, no elevated JVP or carotid bruits, no thyromegaly. Lungs: Decreased breath sounds without wheezing, nonlabored breathing at rest. Cardiac: Regular rate and rhythm, soft systolic murmur. Extremities: Mild leg edema, distal pulses 2+.  ECG:  An ECG dated 05/15/2019 was personally reviewed today and demonstrated:  Atrial fibrillation with low voltage and nonspecific T wave changes.  Recent Labwork: 05/16/2019: ALT 12; AST 17; TSH 2.686 05/20/2019: Hemoglobin 11.3; Platelets 241 05/21/2019: BUN 13; Creatinine, Ser 0.76; Magnesium 1.6; Potassium 4.0; Sodium 141     Component Value Date/Time   CHOL 151 04/14/2017 1232   TRIG 102 04/14/2017 1232   HDL 59 04/14/2017 1232   CHOLHDL 2.6 04/14/2017 1232   CHOLHDL 3.0 04/08/2014 1354     VLDL 25 04/08/2014 1354   LDLCALC 72 04/14/2017 1232    Other Studies Reviewed Today:  Echocardiogram 12/24/2017: - Left ventricle: The cavity size was normal. Wall thickness was  increased in a pattern of mild LVH. Systolic function was normal.  The estimated ejection fraction was in the range of 60% to 65%.  Wall motion was normal; there were no regional wall motion  abnormalities. Features are consistent with a pseudonormal left  ventricular filling pattern, with concomitant abnormal relaxation  and increased filling pressure (grade 2 diastolic dysfunction).  Doppler parameters are consistent with indeterminate ventricular  filling pressure.  - Aortic valve: Moderately calcified annulus. Trileaflet; normal  thickness leaflets.  - Mitral valve: Calcified annulus.   Lexiscan Myoview 10/17/2014: IMPRESSION: 1. Small region of basal inferolateral ischemia as outlined above, summed stress score 5.  2. Normal left ventricular wall motion.  3. Left ventricular ejection fraction 78%  4. Overall low-risk stress test findings*.  Assessment and Plan:  1.  Paroxysmal atrial fibrillation.  CHA2DS2-VASc score is 6.  She is on Xarelto at this time, no obvious bleeding problems.  CBC and BMET will be obtained, I reviewed her lab work from November 2020.  2.  CAD status post BMS and DES to the LAD in 2004 with subsequent DES to the second diagonal in 2005.  No obvious angina symptoms at low-level activity.  Not on aspirin given use of Xarelto.  Otherwise continue Cardizem CD and Crestor.  3.  Chronic diastolic heart failure by history.  LVEF 60 to 65% with grade 2 diastolic dysfunction by last echocardiogram in 2019.  She continues on Lasix with potassium supplement.  4.  Essential hypertension, blood pressure is well controlled today.  Medication Adjustments/Labs and Tests Ordered: Current medicines are reviewed at length with the patient today.  Concerns regarding  medicines are outlined above.   Tests Ordered: Orders Placed This Encounter  Procedures  . CBC  . Basic Metabolic Panel (BMET)    Medication Changes: No orders of the defined types were placed in this encounter.  Disposition:  Follow up 6 months in the [place] office.  Signed, Satira Sark, MD, [facility] 02/25/2020 9:17 AM    [facility] [place]. [address] [place], [place] [address] Phone: [phone]; Fax: [phone]

## 2020-02-25 ENCOUNTER — Other Ambulatory Visit: Payer: Self-pay

## 2020-02-25 ENCOUNTER — Other Ambulatory Visit (HOSPITAL_COMMUNITY)
Admission: RE | Admit: 2020-02-25 | Discharge: 2020-02-25 | Disposition: A | Discharge status: Home / Self Care | Payer: Medicare HMO | Source: Ambulatory Visit | Attending: Cardiology | Admitting: Cardiology

## 2020-02-25 ENCOUNTER — Encounter: Payer: Self-pay | Admitting: Cardiology

## 2020-02-25 ENCOUNTER — Ambulatory Visit (INDEPENDENT_AMBULATORY_CARE_PROVIDER_SITE_OTHER): Payer: Medicare HMO | Admitting: Cardiology

## 2020-02-25 VITALS — BP 115/79 | HR 67

## 2020-02-25 DIAGNOSIS — I5032 Chronic diastolic (congestive) heart failure: Secondary | ICD-10-CM | POA: Diagnosis not present

## 2020-02-25 DIAGNOSIS — I10 Essential (primary) hypertension: Secondary | ICD-10-CM

## 2020-02-25 DIAGNOSIS — I480 Paroxysmal atrial fibrillation: Secondary | ICD-10-CM | POA: Diagnosis not present

## 2020-02-25 DIAGNOSIS — I25119 Atherosclerotic heart disease of native coronary artery with unspecified angina pectoris: Secondary | ICD-10-CM | POA: Insufficient documentation

## 2020-02-25 DIAGNOSIS — E782 Mixed hyperlipidemia: Secondary | ICD-10-CM

## 2020-02-25 LAB — BASIC METABOLIC PANEL
Anion gap: 12 (ref 5–15)
BUN: 19 mg/dL (ref 8–23)
CO2: 31 mmol/L (ref 22–32)
Calcium: 9.5 mg/dL (ref 8.9–10.3)
Chloride: 98 mmol/L (ref 98–111)
Creatinine, Ser: 0.88 mg/dL (ref 0.44–1.00)
GFR calc Af Amer: >60 mL/min (ref >60)
GFR calc non Af Amer: >60 mL/min (ref >60)
Glucose, Bld: 109 mg/dL — ABNORMAL HIGH (ref 70–99)
Potassium: 3.8 mmol/L (ref 3.5–5.1)
Sodium: 141 mmol/L (ref 135–145)

## 2020-02-25 LAB — CBC
HCT: 46.4 % — ABNORMAL HIGH (ref 36.0–46.0)
Hemoglobin: 13.9 g/dL (ref 12.0–15.0)
MCH: 27 pg (ref 26.0–34.0)
MCHC: 30 g/dL (ref 30.0–36.0)
MCV: 90.1 fL (ref 80.0–100.0)
Platelets: 324 10*3/uL (ref 150–400)
RBC: 5.15 MIL/uL — ABNORMAL HIGH (ref 3.87–5.11)
RDW: 15.6 % — ABNORMAL HIGH (ref 11.5–15.5)
WBC: 13.5 10*3/uL — ABNORMAL HIGH (ref 4.0–10.5)
nRBC: 0 % (ref 0.0–0.2)

## 2020-02-25 NOTE — Patient Instructions (Signed)
Medication Instructions:  Your physician recommends that you continue on your current medications as directed. Please refer to the Current Medication list given to you today.  *If you need a refill on your cardiac medications before your next appointment, please call your pharmacy*   Lab Work: Cbc, bmet today  If you have labs (blood work) drawn today and your tests are completely normal, you will receive your results only by:  [place] (if you have MyChart) OR  A paper copy in the mail If you have any lab test that is abnormal or we need to change your treatment, we will call you to review the results.   Testing/Procedures: None today   Follow-Up: At [facility], you and your health needs are our priority.  As part of our continuing mission to provide you with exceptional heart care, we have created designated Provider Care Teams.  These Care Teams include your primary Cardiologist (physician) and Advanced Practice Providers (APPs -  Physician Assistants and Nurse Practitioners) who all work together to provide you with the care you need, when you need it.  We recommend signing up for the patient portal called "MyChart".  Sign up information is provided on this After Visit Summary.  MyChart is used to connect with patients for Virtual Visits (Telemedicine).  Patients are able to view lab/test results, encounter notes, upcoming appointments, etc.  Non-urgent messages can be sent to your provider as well.   To learn more about what you can do with MyChart, go to [email or web address].    Your next appointment:   6 month(s)  The format for your next appointment:   In Person  Provider:   Rozann Lesches, MD   Other Instructions None       Thank you for choosing [facility] !

## 2020-03-06 DIAGNOSIS — R69 Illness, unspecified: Secondary | ICD-10-CM | POA: Diagnosis not present

## 2020-03-14 DIAGNOSIS — H26493 Other secondary cataract, bilateral: Secondary | ICD-10-CM | POA: Diagnosis not present

## 2020-03-14 DIAGNOSIS — Z961 Presence of intraocular lens: Secondary | ICD-10-CM | POA: Diagnosis not present

## 2020-03-14 DIAGNOSIS — E119 Type 2 diabetes mellitus without complications: Secondary | ICD-10-CM | POA: Diagnosis not present

## 2020-03-14 DIAGNOSIS — H04123 Dry eye syndrome of bilateral lacrimal glands: Secondary | ICD-10-CM | POA: Diagnosis not present

## 2020-03-20 DIAGNOSIS — R69 Illness, unspecified: Secondary | ICD-10-CM | POA: Diagnosis not present

## 2020-03-27 DIAGNOSIS — R69 Illness, unspecified: Secondary | ICD-10-CM | POA: Diagnosis not present

## 2020-03-27 DIAGNOSIS — E039 Hypothyroidism, unspecified: Secondary | ICD-10-CM | POA: Diagnosis not present

## 2020-03-27 DIAGNOSIS — E785 Hyperlipidemia, unspecified: Secondary | ICD-10-CM | POA: Diagnosis not present

## 2020-03-27 DIAGNOSIS — I480 Paroxysmal atrial fibrillation: Secondary | ICD-10-CM | POA: Diagnosis not present

## 2020-03-27 DIAGNOSIS — E119 Type 2 diabetes mellitus without complications: Secondary | ICD-10-CM | POA: Diagnosis not present

## 2020-03-27 DIAGNOSIS — I2510 Atherosclerotic heart disease of native coronary artery without angina pectoris: Secondary | ICD-10-CM | POA: Diagnosis not present

## 2020-03-27 DIAGNOSIS — M797 Fibromyalgia: Secondary | ICD-10-CM | POA: Diagnosis not present

## 2020-03-27 DIAGNOSIS — I10 Essential (primary) hypertension: Secondary | ICD-10-CM | POA: Diagnosis not present

## 2020-03-31 DIAGNOSIS — R69 Illness, unspecified: Secondary | ICD-10-CM | POA: Diagnosis not present

## 2020-04-17 DIAGNOSIS — R69 Illness, unspecified: Secondary | ICD-10-CM | POA: Diagnosis not present

## 2020-04-24 DIAGNOSIS — R410 Disorientation, unspecified: Secondary | ICD-10-CM | POA: Diagnosis not present

## 2020-05-05 DIAGNOSIS — R69 Illness, unspecified: Secondary | ICD-10-CM | POA: Diagnosis not present

## 2020-05-10 DIAGNOSIS — R69 Illness, unspecified: Secondary | ICD-10-CM | POA: Diagnosis not present

## 2020-05-23 DIAGNOSIS — I480 Paroxysmal atrial fibrillation: Secondary | ICD-10-CM | POA: Diagnosis not present

## 2020-05-23 DIAGNOSIS — E039 Hypothyroidism, unspecified: Secondary | ICD-10-CM | POA: Diagnosis not present

## 2020-05-23 DIAGNOSIS — E785 Hyperlipidemia, unspecified: Secondary | ICD-10-CM | POA: Diagnosis not present

## 2020-05-25 DIAGNOSIS — R69 Illness, unspecified: Secondary | ICD-10-CM | POA: Diagnosis not present

## 2020-06-07 DIAGNOSIS — R69 Illness, unspecified: Secondary | ICD-10-CM | POA: Diagnosis not present

## 2020-07-17 DIAGNOSIS — E785 Hyperlipidemia, unspecified: Secondary | ICD-10-CM | POA: Diagnosis not present

## 2020-07-17 DIAGNOSIS — E039 Hypothyroidism, unspecified: Secondary | ICD-10-CM | POA: Diagnosis not present

## 2020-07-17 DIAGNOSIS — E119 Type 2 diabetes mellitus without complications: Secondary | ICD-10-CM | POA: Diagnosis not present

## 2020-07-17 DIAGNOSIS — I10 Essential (primary) hypertension: Secondary | ICD-10-CM | POA: Diagnosis not present

## 2020-07-17 DIAGNOSIS — I480 Paroxysmal atrial fibrillation: Secondary | ICD-10-CM | POA: Diagnosis not present

## 2020-07-17 DIAGNOSIS — M797 Fibromyalgia: Secondary | ICD-10-CM | POA: Diagnosis not present

## 2020-07-17 DIAGNOSIS — R69 Illness, unspecified: Secondary | ICD-10-CM | POA: Diagnosis not present

## 2020-07-17 DIAGNOSIS — I2510 Atherosclerotic heart disease of native coronary artery without angina pectoris: Secondary | ICD-10-CM | POA: Diagnosis not present

## 2020-07-18 DIAGNOSIS — M85822 Other specified disorders of bone density and structure, left upper arm: Secondary | ICD-10-CM | POA: Diagnosis not present

## 2020-07-18 DIAGNOSIS — M79622 Pain in left upper arm: Secondary | ICD-10-CM | POA: Diagnosis not present

## 2020-07-18 DIAGNOSIS — M19012 Primary osteoarthritis, left shoulder: Secondary | ICD-10-CM | POA: Diagnosis not present

## 2020-07-20 DIAGNOSIS — R69 Illness, unspecified: Secondary | ICD-10-CM | POA: Diagnosis not present

## 2020-07-26 DIAGNOSIS — M79621 Pain in right upper arm: Secondary | ICD-10-CM | POA: Diagnosis not present

## 2020-07-26 DIAGNOSIS — M19011 Primary osteoarthritis, right shoulder: Secondary | ICD-10-CM | POA: Diagnosis not present

## 2020-07-26 DIAGNOSIS — M19031 Primary osteoarthritis, right wrist: Secondary | ICD-10-CM | POA: Diagnosis not present

## 2020-08-01 DIAGNOSIS — R69 Illness, unspecified: Secondary | ICD-10-CM | POA: Diagnosis not present

## 2020-08-09 DIAGNOSIS — R69 Illness, unspecified: Secondary | ICD-10-CM | POA: Diagnosis not present

## 2020-08-15 DIAGNOSIS — R69 Illness, unspecified: Secondary | ICD-10-CM | POA: Diagnosis not present

## 2020-08-21 DIAGNOSIS — R69 Illness, unspecified: Secondary | ICD-10-CM | POA: Diagnosis not present

## 2020-08-30 NOTE — Progress Notes (Deleted)
Cardiology Office Note  Date: 08/30/2020   ID: [name], [place] 1941/02/03, MRN [id number]  PCP:  Mikey Kirschner, MD (Inactive)  Cardiologist:  Rozann Lesches, MD Electrophysiologist:  None   No chief complaint on file.   History of Present Illness: [name] is an 80 y.o. female last seen in September 2021.  Past Medical History:  Diagnosis Date  . Allergy   . Arthritis   . Asthma   . CAD (coronary artery disease)    BMS and DES to LAD 2004; DES to D2 2005  . Chronic pain of left knee   . Depression   . Diverticulitis of intestine   . Essential hypertension   . FH: colonic polyps    Adenomataous  . GERD (gastroesophageal reflux disease)   . History of kidney stones   . Hyperlipidemia   . Hypothyroidism   . Insomnia   . Irritable bowel syndrome   . NSTEMI (non-ST elevated myocardial infarction) ([place])    2005  . Paroxysmal atrial fibrillation (HCC)   . Pulmonary embolism ([place])    2000 and 2010  . Sleep apnea   . Type 2 diabetes mellitus ([place])   . Venous stasis     Past Surgical History:  Procedure Laterality Date  . ABDOMINAL HYSTERECTOMY    . APPENDECTOMY    . BALLOON DILATION N/A 11/11/2013   Procedure: BALLOON DILATION;  Surgeon: Rogene Houston, MD;  [facility];  Service: Endoscopy;  Laterality: N/A;  . BREAST REDUCTION SURGERY    . BREAST SURGERY    . CHOLECYSTECTOMY    . CLEFT PALATE REPAIR     4 surgeries  . COLONOSCOPY  2011   negative  . COLONOSCOPY N/A 05/10/2014   Procedure: COLONOSCOPY;  Surgeon: Rogene Houston, MD;  [facility];  Service: Endoscopy;  Laterality: N/A;  . ESOPHAGEAL DILATION N/A 03/08/2016   Procedure: ESOPHAGEAL DILATION;  Surgeon: Rogene Houston, MD;  [facility];  Service: Endoscopy;  Laterality: N/A;  . ESOPHAGEAL DILATION N/A 05/16/2019   Procedure: ESOPHAGEAL DILATION;  Surgeon: Danie Binder, MD;  [facility];  Service: Endoscopy;  Laterality: N/A;  .  ESOPHAGOGASTRODUODENOSCOPY N/A 11/11/2013   Procedure: ESOPHAGOGASTRODUODENOSCOPY (EGD);  Surgeon: Rogene Houston, MD;  [facility];  Service: Endoscopy;  Laterality: N/A;  200-moved to 100 Ann notified pt  . ESOPHAGOGASTRODUODENOSCOPY N/A 05/09/2014   Procedure: ESOPHAGOGASTRODUODENOSCOPY (EGD);  Surgeon: Rogene Houston, MD;  [facility];  Service: Endoscopy;  Laterality: N/A;  . ESOPHAGOGASTRODUODENOSCOPY N/A 03/08/2016   Procedure: ESOPHAGOGASTRODUODENOSCOPY (EGD);  Surgeon: Rogene Houston, MD;  [facility];  Service: Endoscopy;  Laterality: N/A;  2:20  . ESOPHAGOGASTRODUODENOSCOPY N/A 05/16/2019   Procedure: ESOPHAGOGASTRODUODENOSCOPY (EGD);  Surgeon: Danie Binder, MD;  [facility];  Service: Endoscopy;  Laterality: N/A;  . HEEL SPUR SURGERY    . KNEE SURGERY    . MALONEY DILATION N/A 11/11/2013   Procedure: Venia Minks DILATION;  Surgeon: Rogene Houston, MD;  [facility];  Service: Endoscopy;  Laterality: N/A;  . OOPHORECTOMY    . SAVORY DILATION N/A 11/11/2013   Procedure: SAVORY DILATION;  Surgeon: Rogene Houston, MD;  [facility];  Service: Endoscopy;  Laterality: N/A;  . TONSILLECTOMY      Current Outpatient Medications  Medication Sig Dispense Refill  . acetaminophen (TYLENOL) 500 MG tablet Take 500 mg by mouth daily  as needed for headache.    . albuterol (PROVENTIL) (2.5 MG/3ML) 0.083% nebulizer solution Take 3 mLs (2.5 mg total) by nebulization every 6 (six) hours as needed for wheezing or shortness of breath. 75 mL 5  . albuterol (VENTOLIN HFA) 108 (90 Base) MCG/ACT inhaler INHALE 2 PUFFS INTO THE LUNGS 4 TIMES DAILY AS NEEDED FOR WHEEZING. 18 g 5  . Cholecalciferol (VITAMIN D3) 1000 units CAPS Take 1 capsule every evening by mouth.     . conjugated estrogens (PREMARIN) vaginal cream Place 1 Applicatorful vaginally 2 (two) times a week. On Mondays and Fridays    . diltiazem (CARDIZEM CD) 120 MG 24 hr capsule  Take 1 capsule (120 mg total) by mouth daily.    . diphenoxylate-atropine (LOMOTIL) 2.5-0.025 MG tablet TAKE ONE TABLET BY MOUTH 2 TIMES A DAY AS NEEDED. 60 tablet 0  . escitalopram (LEXAPRO) 20 MG tablet TAKE ONE TABLET BY MOUTH ONCE DAILY. 30 tablet 0  . FLOVENT DISKUS 100 MCG/BLIST AEPB Inhale 2 puffs into the lungs in the morning and at bedtime.    . furosemide (LASIX) 40 MG tablet TAKE ONE TABLET BY MOUTH DAILY. 30 tablet 0  . hydrocortisone cream 1 % Apply 1 application topically 2 (two) times daily.    Marland Kitchen levothyroxine (SYNTHROID) 50 MCG tablet Take 50 mcg by mouth daily before breakfast.    . lidocaine (XYLOCAINE) 2 % solution Use as directed 10 mLs in the mouth or throat 4 (four) times daily -  with meals and at bedtime.  0  . magnesium oxide (MAG-OX) 400 MG tablet Take 400 mg by mouth daily.    . meclizine (ANTIVERT) 25 MG tablet Take 1 tablet (25 mg total) by mouth 3 (three) times daily as needed for dizziness. 30 tablet 0  . metoCLOPramide (REGLAN) 5 MG tablet Take 1 tablet (5 mg total) by mouth 3 (three) times daily before meals. 90 tablet 0  . nitroGLYCERIN (NITROLINGUAL) 0.4 MG/SPRAY spray Place 1 spray under the tongue every 5 (five) minutes x 3 doses as needed for chest pain. 12 g 3  . pantoprazole (PROTONIX) 40 MG tablet Take 1 tablet (40 mg total) by mouth 2 (two) times daily before a meal. X 1 month, then once daily forever 60 tablet 0  . Potassium Chloride ER 20 MEQ TBCR One po daily 90 tablet 1  . rivaroxaban (XARELTO) 20 MG TABS tablet Take 20 mg by mouth daily with supper.    . rosuvastatin (CRESTOR) 20 MG tablet Take 20 mg by mouth daily.    . simethicone (MYLICON) 932 MG chewable tablet Chew 125 mg by mouth 3 (three) times daily with meals.    . triamcinolone cream (KENALOG) 0.1 % Apply 1 application topically 2 (two) times daily. 60 g 6   No current facility-administered medications for this visit.   Allergies:  Cefzil [cefprozil] and Demerol   Social History: The  patient  reports that she has quit smoking. Her smoking use included cigarettes. She started smoking about 33 years ago. She has a 60.00 pack-year smoking history. She has never used smokeless tobacco. She reports that she does not drink alcohol and does not use drugs.   Family History: The patient's family history includes Diabetes in her mother; Heart attack in her father and mother; Hyperlipidemia in her mother; Lung cancer in her sister; Lymphoma in her brother.   ROS:  Please see the history of present illness. Otherwise, complete review of systems is positive for {NONE DEFAULTED:18576::"none"}.  All other systems are reviewed and negative.   Physical Exam: VS:  There were no vitals taken for this visit., BMI There is no height or weight on file to calculate BMI.  Wt Readings from Last 3 Encounters:  07/19/19 223 lb (101.2 kg)  05/21/19 236 lb 12.4 oz (107.4 kg)  08/13/18 200 lb (90.7 kg)    General: Patient appears comfortable at rest. HEENT: Conjunctiva and lids normal, oropharynx clear with moist mucosa. Neck: Supple, no elevated JVP or carotid bruits, no thyromegaly. Lungs: Clear to auscultation, nonlabored breathing at rest. Cardiac: Regular rate and rhythm, no S3 or significant systolic murmur, no pericardial rub. Abdomen: Soft, nontender, no hepatomegaly, bowel sounds present, no guarding or rebound. Extremities: No pitting edema, distal pulses 2+. Skin: Warm and dry. Musculoskeletal: No kyphosis. Neuropsychiatric: Alert and oriented x3, affect grossly appropriate.  ECG:  An ECG dated 05/15/2019 was personally reviewed today and demonstrated:  Atrial fibrillation with low voltage and nonspecific T wave changes  Recent Labwork: 02/25/2020: BUN 19; Creatinine, Ser 0.88; Hemoglobin 13.9; Platelets 324; Potassium 3.8; Sodium 141     Component Value Date/Time   CHOL 151 04/14/2017 1232   TRIG 102 04/14/2017 1232   HDL 59 04/14/2017 1232   CHOLHDL 2.6 04/14/2017 1232   CHOLHDL  3.0 04/08/2014 1354   VLDL 25 04/08/2014 1354   LDLCALC 72 04/14/2017 1232    Other Studies Reviewed Today:  Echocardiogram 12/24/2017: - Left ventricle: The cavity size was normal. Wall thickness was  increased in a pattern of mild LVH. Systolic function was normal.  The estimated ejection fraction was in the range of 60% to 65%.  Wall motion was normal; there were no regional wall motion  abnormalities. Features are consistent with a pseudonormal left  ventricular filling pattern, with concomitant abnormal relaxation  and increased filling pressure (grade 2 diastolic dysfunction).  Doppler parameters are consistent with indeterminate ventricular  filling pressure.  - Aortic valve: Moderately calcified annulus. Trileaflet; normal  thickness leaflets.  - Mitral valve: Calcified annulus.   Lexiscan Myoview 10/17/2014: IMPRESSION: 1. Small region of basal inferolateral ischemia as outlined above, summed stress score 5.  2. Normal left ventricular wall motion.  3. Left ventricular ejection fraction 78%  4. Overall low-risk stress test findings*.  Assessment and Plan:   Medication Adjustments/Labs and Tests Ordered: Current medicines are reviewed at length with the patient today.  Concerns regarding medicines are outlined above.   Tests Ordered: No orders of the defined types were placed in this encounter.   Medication Changes: No orders of the defined types were placed in this encounter.   Disposition:  Follow up {follow up:15908}  Signed, Satira Sark, MD, [facility] 08/30/2020 11:36 AM    [facility] at [place]. [address] [place], [place] [address] Phone: [phone]; Fax: [phone]

## 2020-08-31 ENCOUNTER — Ambulatory Visit: Payer: Medicare HMO | Admitting: Cardiology

## 2020-08-31 DIAGNOSIS — R69 Illness, unspecified: Secondary | ICD-10-CM | POA: Diagnosis not present

## 2020-08-31 DIAGNOSIS — I480 Paroxysmal atrial fibrillation: Secondary | ICD-10-CM

## 2020-09-12 DIAGNOSIS — R69 Illness, unspecified: Secondary | ICD-10-CM | POA: Diagnosis not present

## 2020-09-12 DIAGNOSIS — R251 Tremor, unspecified: Secondary | ICD-10-CM | POA: Diagnosis not present

## 2020-09-12 DIAGNOSIS — I10 Essential (primary) hypertension: Secondary | ICD-10-CM | POA: Diagnosis not present

## 2020-09-14 DIAGNOSIS — E119 Type 2 diabetes mellitus without complications: Secondary | ICD-10-CM | POA: Diagnosis not present

## 2020-09-14 DIAGNOSIS — I2510 Atherosclerotic heart disease of native coronary artery without angina pectoris: Secondary | ICD-10-CM | POA: Diagnosis not present

## 2020-09-14 DIAGNOSIS — I10 Essential (primary) hypertension: Secondary | ICD-10-CM | POA: Diagnosis not present

## 2020-09-14 DIAGNOSIS — R251 Tremor, unspecified: Secondary | ICD-10-CM | POA: Diagnosis not present

## 2020-09-14 DIAGNOSIS — I480 Paroxysmal atrial fibrillation: Secondary | ICD-10-CM | POA: Diagnosis not present

## 2020-09-19 NOTE — Progress Notes (Deleted)
Cardiology Office Note    Date:  09/19/2020   ID:  [name], [name] 1941/06/24, MRN [id number]   PCP:  Mikey Kirschner, MD (Inactive)   [place]  Cardiologist:  Rozann Lesches, MD *** Advanced Practice Provider:  No care team member to display Electrophysiologist:  None   [id number]}   No chief complaint on file.   History of Present Illness:  [name] is a 80 y.o. female with history of CAD status post BMS and DES to the LAD in 2004, DES to diagonal 07/2003, PAF on Xarelto CHA2DS2-VASc equals 6, chronic diastolic CHF LVEF 60 to 53% with grade 2 DD on echo 2019, hypertension, history of pulmonary emboli    Past Medical History:  Diagnosis Date  . Allergy   . Arthritis   . Asthma   . CAD (coronary artery disease)    BMS and DES to LAD 2004; DES to D2 2005  . Chronic pain of left knee   . Depression   . Diverticulitis of intestine   . Essential hypertension   . FH: colonic polyps    Adenomataous  . GERD (gastroesophageal reflux disease)   . History of kidney stones   . Hyperlipidemia   . Hypothyroidism   . Insomnia   . Irritable bowel syndrome   . NSTEMI (non-ST elevated myocardial infarction) ([place])    2005  . Paroxysmal atrial fibrillation (HCC)   . Pulmonary embolism ([place])    2000 and 2010  . Sleep apnea   . Type 2 diabetes mellitus ([place])   . Venous stasis     Past Surgical History:  Procedure Laterality Date  . ABDOMINAL HYSTERECTOMY    . APPENDECTOMY    . BALLOON DILATION N/A 11/11/2013   Procedure: BALLOON DILATION;  Surgeon: Rogene Houston, MD;  [facility];  Service: Endoscopy;  Laterality: N/A;  . BREAST REDUCTION SURGERY    . BREAST SURGERY    . CHOLECYSTECTOMY    . CLEFT PALATE REPAIR     4 surgeries  . COLONOSCOPY  2011   negative  . COLONOSCOPY N/A 05/10/2014   Procedure: COLONOSCOPY;  Surgeon: Rogene Houston, MD;  [facility];  Service: Endoscopy;  Laterality: N/A;  .  ESOPHAGEAL DILATION N/A 03/08/2016   Procedure: ESOPHAGEAL DILATION;  Surgeon: Rogene Houston, MD;  [facility];  Service: Endoscopy;  Laterality: N/A;  . ESOPHAGEAL DILATION N/A 05/16/2019   Procedure: ESOPHAGEAL DILATION;  Surgeon: Danie Binder, MD;  [facility];  Service: Endoscopy;  Laterality: N/A;  . ESOPHAGOGASTRODUODENOSCOPY N/A 11/11/2013   Procedure: ESOPHAGOGASTRODUODENOSCOPY (EGD);  Surgeon: Rogene Houston, MD;  [facility];  Service: Endoscopy;  Laterality: N/A;  200-moved to 100 Ann notified pt  . ESOPHAGOGASTRODUODENOSCOPY N/A 05/09/2014   Procedure: ESOPHAGOGASTRODUODENOSCOPY (EGD);  Surgeon: Rogene Houston, MD;  [facility];  Service: Endoscopy;  Laterality: N/A;  . ESOPHAGOGASTRODUODENOSCOPY N/A 03/08/2016   Procedure: ESOPHAGOGASTRODUODENOSCOPY (EGD);  Surgeon: Rogene Houston, MD;  [facility];  Service: Endoscopy;  Laterality: N/A;  2:20  . ESOPHAGOGASTRODUODENOSCOPY N/A 05/16/2019   Procedure: ESOPHAGOGASTRODUODENOSCOPY (EGD);  Surgeon: Danie Binder, MD;  [facility];  Service: Endoscopy;  Laterality: N/A;  . HEEL SPUR SURGERY    . KNEE SURGERY    . MALONEY DILATION N/A 11/11/2013   Procedure: Venia Minks DILATION;  Surgeon: Rogene Houston, MD;  [facility];  Service:  Endoscopy;  Laterality: N/A;  . OOPHORECTOMY    . SAVORY DILATION N/A 11/11/2013   Procedure: SAVORY DILATION;  Surgeon: Rogene Houston, MD;  [facility];  Service: Endoscopy;  Laterality: N/A;  . TONSILLECTOMY      Current Medications: No outpatient medications have been marked as taking for the 09/25/20 encounter (Appointment) with Imogene Burn, PA-C.     Allergies:   Cefzil [cefprozil] and Demerol   Social History   Socioeconomic History  . Marital status: Widowed    Spouse name: Not on file  . Number of children: Not on file  . Years of education: Not on file  . Highest education level: Not on  file  Occupational History  . Occupation: retired    Fish farm manager: RETIRED  Tobacco Use  . Smoking status: Former Smoker    Packs/day: 2.00    Years: 30.00    Pack years: 60.00    Types: Cigarettes    Start date: 05/05/1987  . Smokeless tobacco: Never Used  Vaping Use  . Vaping Use: Never used  Substance and Sexual Activity  . Alcohol use: No    Alcohol/week: 0.0 standard drinks  . Drug use: No  . Sexual activity: Not on file  Other Topics Concern  . Not on file  Social History Narrative  . Not on file   Social Determinants of Health   Financial Resource Strain: Not on file  Food Insecurity: Not on file  Transportation Needs: Not on file  Physical Activity: Not on file  Stress: Not on file  Social Connections: Not on file     Family History:  The patient's ***family history includes Diabetes in her mother; Heart attack in her father and mother; Hyperlipidemia in her mother; Lung cancer in her sister; Lymphoma in her brother.   ROS:   Please see the history of present illness.    ROS All other systems reviewed and are negative.   PHYSICAL EXAM:   VS:  There were no vitals taken for this visit.  Physical Exam  GEN: Well nourished, well developed, in no acute distress  HEENT: normal  Neck: no JVD, carotid bruits, or masses Cardiac:RRR; no murmurs, rubs, or gallops  Respiratory:  clear to auscultation bilaterally, normal work of breathing GI: soft, nontender, nondistended, + BS Ext: without cyanosis, clubbing, or edema, Good distal pulses bilaterally MS: no deformity or atrophy  Skin: warm and dry, no rash Neuro:  Alert and Oriented x 3, Strength and sensation are intact Psych: euthymic mood, full affect  Wt Readings from Last 3 Encounters:  07/19/19 223 lb (101.2 kg)  05/21/19 236 lb 12.4 oz (107.4 kg)  08/13/18 200 lb (90.7 kg)      Studies/Labs Reviewed:   EKG:  EKG is*** ordered today.  The ekg ordered today demonstrates ***  Recent Labs: 02/25/2020: BUN  19; Creatinine, Ser 0.88; Hemoglobin 13.9; Platelets 324; Potassium 3.8; Sodium 141   Lipid Panel    Component Value Date/Time   CHOL 151 04/14/2017 1232   TRIG 102 04/14/2017 1232   HDL 59 04/14/2017 1232   CHOLHDL 2.6 04/14/2017 1232   CHOLHDL 3.0 04/08/2014 1354   VLDL 25 04/08/2014 1354   LDLCALC 72 04/14/2017 1232    Additional studies/ records that were reviewed today include:  Echocardiogram 12/24/2017: - Left ventricle: The cavity size was normal. Wall thickness was    increased in a pattern of mild LVH. Systolic function was normal.    The estimated ejection  fraction was in the range of 60% to 65%.    Wall motion was normal; there were no regional wall motion    abnormalities. Features are consistent with a pseudonormal left    ventricular filling pattern, with concomitant abnormal relaxation    and increased filling pressure (grade 2 diastolic dysfunction).    Doppler parameters are consistent with indeterminate ventricular    filling pressure.  - Aortic valve: Moderately calcified annulus. Trileaflet; normal    thickness leaflets.  - Mitral valve: Calcified annulus.    Lexiscan Myoview 10/17/2014: IMPRESSION: 1. Small region of basal inferolateral ischemia as outlined above, summed stress score 5.   2. Normal left ventricular wall motion.   3. Left ventricular ejection fraction 78%   4. Overall low-risk stress test findings*.    Risk Assessment/Calculations:   {Does this patient have ATRIAL FIBRILLATION?:[phone]}     ASSESSMENT:    No diagnosis found.   PLAN:    PAF CHA2DS2-VASc equals 6 on Xarelto   CAD status post BMS and DES to the LAD in 2004 with subsequent DES to the second diagonal in 2005.  No obvious angina symptoms at low-level activity.  Not on aspirin given use of Xarelto.  Otherwise continue Cardizem CD and Crestor.    Chronic diastolic heart failure by history.  LVEF 60 to 65% with grade 2 diastolic dysfunction on echocardiogram in  2019.  She continues on Lasix with potassium supplement.    Essential hypertension, blood pressure is well controlled today.     Shared Decision Making/Informed Consent   {Are you ordering a CV Procedure (e.g. stress test, cath, DCCV, TEE, etc)?   Press F2        :[id number]}    Medication Adjustments/Labs and Tests Ordered: Current medicines are reviewed at length with the patient today.  Concerns regarding medicines are outlined above.  Medication changes, Labs and Tests ordered today are listed in the Patient Instructions below. There are no Patient Instructions on file for this visit.   Sumner Boast, PA-C  09/19/2020 10:15 AM    [facility] [address], [place], [place]  [address] Phone: [phone]; Fax: [phone]

## 2020-09-25 ENCOUNTER — Ambulatory Visit: Payer: Medicare HMO | Admitting: Physician Assistant

## 2020-09-25 DIAGNOSIS — I2510 Atherosclerotic heart disease of native coronary artery without angina pectoris: Secondary | ICD-10-CM

## 2020-09-25 DIAGNOSIS — Z86711 Personal history of pulmonary embolism: Secondary | ICD-10-CM

## 2020-09-25 DIAGNOSIS — I480 Paroxysmal atrial fibrillation: Secondary | ICD-10-CM

## 2020-09-25 DIAGNOSIS — I5032 Chronic diastolic (congestive) heart failure: Secondary | ICD-10-CM

## 2020-11-06 DIAGNOSIS — M353 Polymyalgia rheumatica: Secondary | ICD-10-CM | POA: Diagnosis not present

## 2020-11-06 DIAGNOSIS — K2100 Gastro-esophageal reflux disease with esophagitis, without bleeding: Secondary | ICD-10-CM | POA: Diagnosis not present

## 2020-11-06 DIAGNOSIS — M797 Fibromyalgia: Secondary | ICD-10-CM | POA: Diagnosis not present

## 2020-11-06 DIAGNOSIS — E039 Hypothyroidism, unspecified: Secondary | ICD-10-CM | POA: Diagnosis not present

## 2020-11-06 DIAGNOSIS — I2510 Atherosclerotic heart disease of native coronary artery without angina pectoris: Secondary | ICD-10-CM | POA: Diagnosis not present

## 2020-11-06 DIAGNOSIS — E119 Type 2 diabetes mellitus without complications: Secondary | ICD-10-CM | POA: Diagnosis not present

## 2020-11-06 DIAGNOSIS — E785 Hyperlipidemia, unspecified: Secondary | ICD-10-CM | POA: Diagnosis not present

## 2020-11-06 DIAGNOSIS — I10 Essential (primary) hypertension: Secondary | ICD-10-CM | POA: Diagnosis not present

## 2020-11-07 DIAGNOSIS — D509 Iron deficiency anemia, unspecified: Secondary | ICD-10-CM | POA: Diagnosis not present

## 2020-11-07 DIAGNOSIS — N184 Chronic kidney disease, stage 4 (severe): Secondary | ICD-10-CM | POA: Diagnosis not present

## 2020-11-07 DIAGNOSIS — E039 Hypothyroidism, unspecified: Secondary | ICD-10-CM | POA: Diagnosis not present

## 2020-11-07 DIAGNOSIS — E119 Type 2 diabetes mellitus without complications: Secondary | ICD-10-CM | POA: Diagnosis not present

## 2020-11-15 DIAGNOSIS — L84 Corns and callosities: Secondary | ICD-10-CM | POA: Diagnosis not present

## 2020-11-15 DIAGNOSIS — E1151 Type 2 diabetes mellitus with diabetic peripheral angiopathy without gangrene: Secondary | ICD-10-CM | POA: Diagnosis not present

## 2020-12-21 DIAGNOSIS — Z23 Encounter for immunization: Secondary | ICD-10-CM | POA: Diagnosis not present

## 2021-01-09 DIAGNOSIS — R42 Dizziness and giddiness: Secondary | ICD-10-CM | POA: Diagnosis not present

## 2021-01-09 DIAGNOSIS — R0602 Shortness of breath: Secondary | ICD-10-CM | POA: Diagnosis not present

## 2021-01-09 DIAGNOSIS — E559 Vitamin D deficiency, unspecified: Secondary | ICD-10-CM | POA: Diagnosis not present

## 2021-01-25 DIAGNOSIS — M797 Fibromyalgia: Secondary | ICD-10-CM | POA: Diagnosis not present

## 2021-01-25 DIAGNOSIS — R69 Illness, unspecified: Secondary | ICD-10-CM | POA: Diagnosis not present

## 2021-01-25 DIAGNOSIS — I480 Paroxysmal atrial fibrillation: Secondary | ICD-10-CM | POA: Diagnosis not present

## 2021-01-25 DIAGNOSIS — E119 Type 2 diabetes mellitus without complications: Secondary | ICD-10-CM | POA: Diagnosis not present

## 2021-01-25 DIAGNOSIS — I2510 Atherosclerotic heart disease of native coronary artery without angina pectoris: Secondary | ICD-10-CM | POA: Diagnosis not present

## 2021-01-25 DIAGNOSIS — E039 Hypothyroidism, unspecified: Secondary | ICD-10-CM | POA: Diagnosis not present

## 2021-01-25 DIAGNOSIS — N898 Other specified noninflammatory disorders of vagina: Secondary | ICD-10-CM | POA: Diagnosis not present

## 2021-01-25 DIAGNOSIS — I10 Essential (primary) hypertension: Secondary | ICD-10-CM | POA: Diagnosis not present

## 2021-02-07 DIAGNOSIS — E1151 Type 2 diabetes mellitus with diabetic peripheral angiopathy without gangrene: Secondary | ICD-10-CM | POA: Diagnosis not present

## 2021-03-05 DIAGNOSIS — E039 Hypothyroidism, unspecified: Secondary | ICD-10-CM | POA: Diagnosis not present

## 2021-03-05 DIAGNOSIS — E785 Hyperlipidemia, unspecified: Secondary | ICD-10-CM | POA: Diagnosis not present

## 2021-03-05 DIAGNOSIS — K2100 Gastro-esophageal reflux disease with esophagitis, without bleeding: Secondary | ICD-10-CM | POA: Diagnosis not present

## 2021-03-05 DIAGNOSIS — I10 Essential (primary) hypertension: Secondary | ICD-10-CM | POA: Diagnosis not present

## 2021-03-05 DIAGNOSIS — R69 Illness, unspecified: Secondary | ICD-10-CM | POA: Diagnosis not present

## 2021-03-05 DIAGNOSIS — I2510 Atherosclerotic heart disease of native coronary artery without angina pectoris: Secondary | ICD-10-CM | POA: Diagnosis not present

## 2021-03-05 DIAGNOSIS — M797 Fibromyalgia: Secondary | ICD-10-CM | POA: Diagnosis not present

## 2021-03-05 DIAGNOSIS — E119 Type 2 diabetes mellitus without complications: Secondary | ICD-10-CM | POA: Diagnosis not present

## 2021-03-16 IMAGING — CT CT NECK W/ CM
4 of 8 series · 13 of 33 positions shown, 14 images · IV contrast (Omnipaque or Isovue)
Comparison: None available.

CLINICAL DATA: Initial evaluation for acute pain with swallowing
since yesterday.

EXAM:
CT NECK WITH CONTRAST
TECHNIQUE: Multidetector CT imaging of the neck was performed using the
standard protocol following the bolus administration of intravenous
contrast.
CONTRAST:  75mL OMNIPAQUE IOHEXOL 300 MG/ML  SOLN

[Series 2: routine chest without · axial · non-contrast · 0.75mm/px · z∈[+747,+887]mm · 3 of 142 slices shown, 4 images]
[im 36/142  soft-tissue]
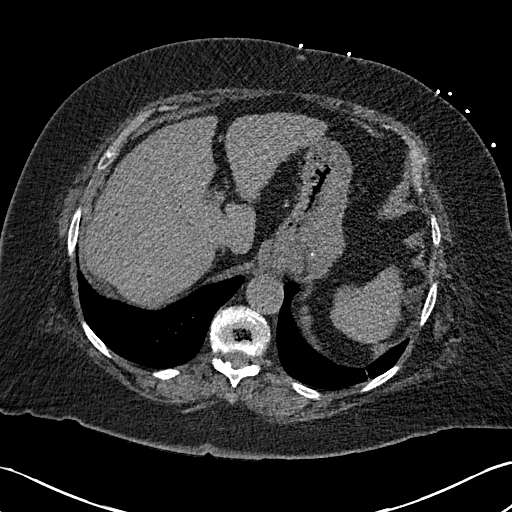
[im 36/142  bone]
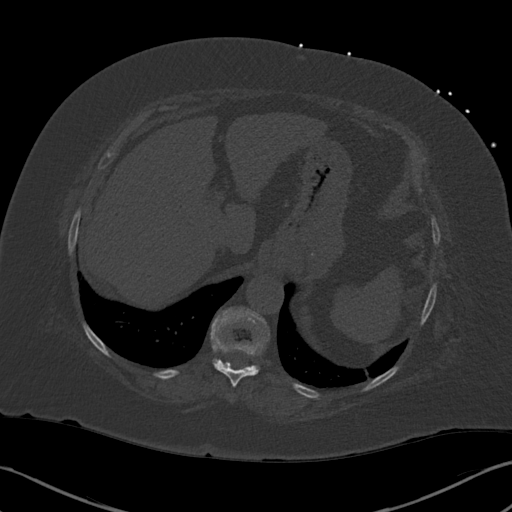
[im 71/142  bone]
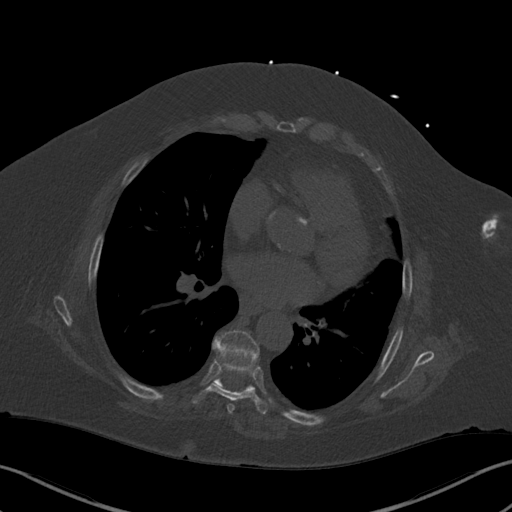
[im 106/142  bone]
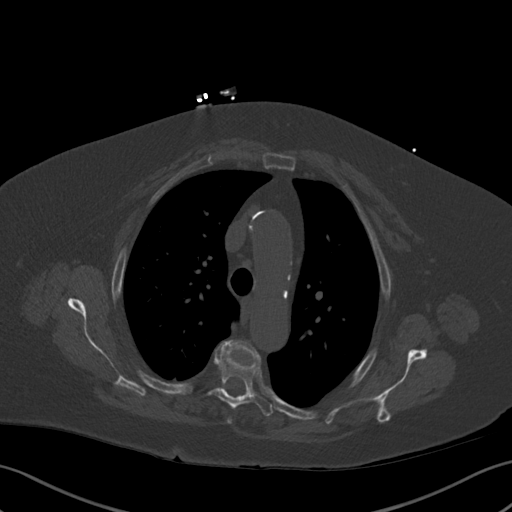

[Series 4: lungs · axial · 0.75mm/px · z∈[+747,+887]mm · 3 of 142 slices shown]
[im 36/142  bone]
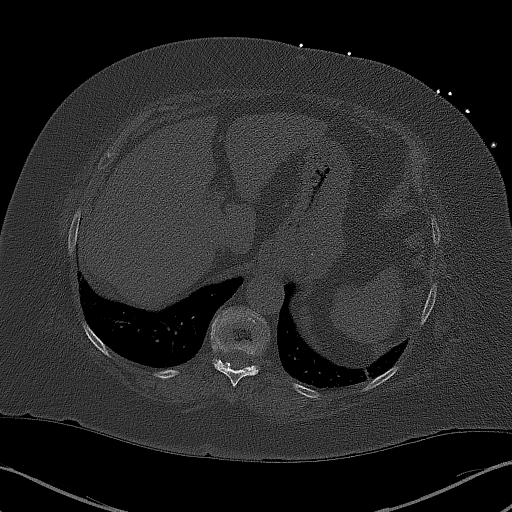
[im 71/142  bone]
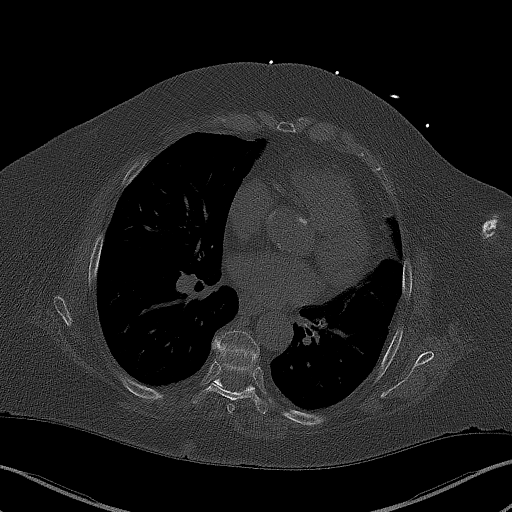
[im 106/142  bone]
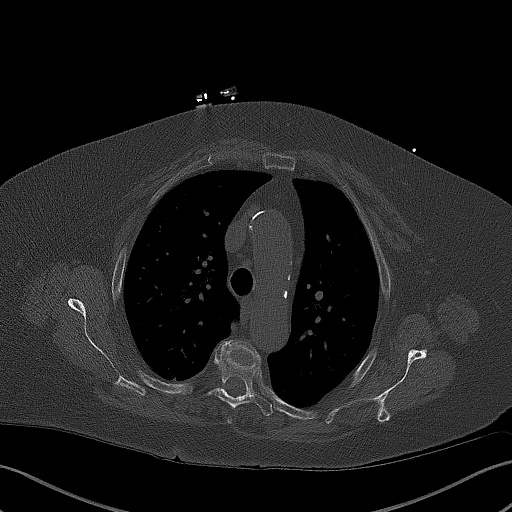

[Series 5: coronal · coronal · 0.59mm/px · 2 of 169 slices shown]
[im 57/169  bone]
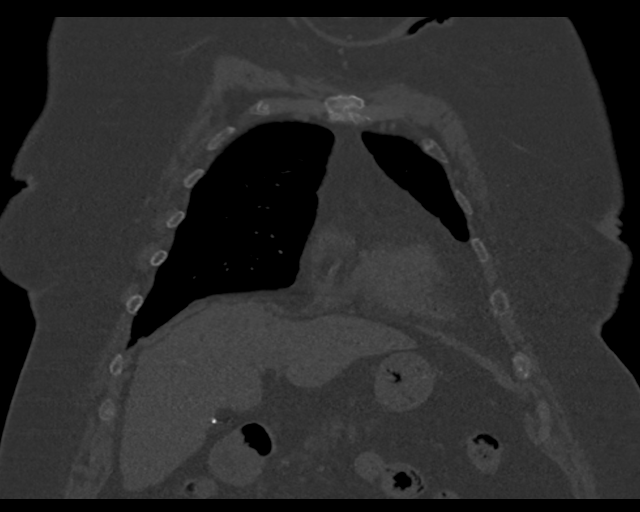
[im 113/169  bone]
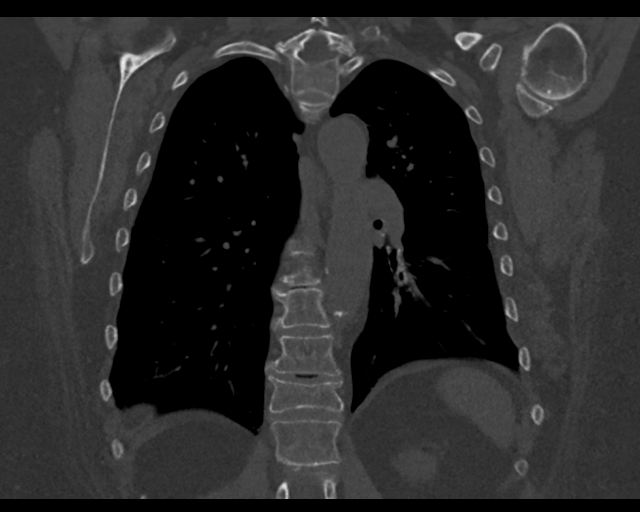

[Series 6: sagittal · sagittal · 0.61mm/px · 5 of 206 slices shown]
[im 30/206  bone]
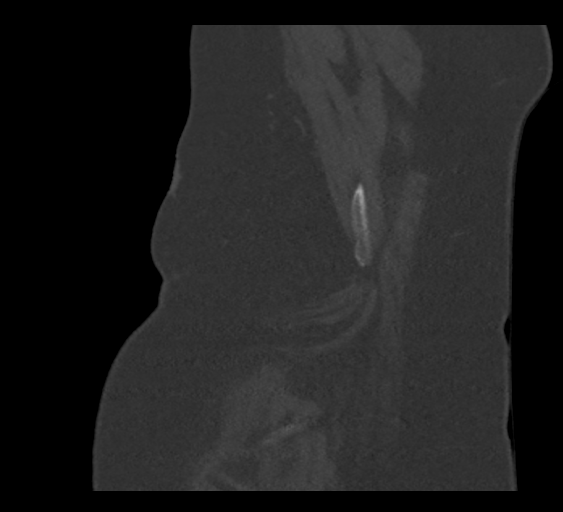
[im 59/206  bone]
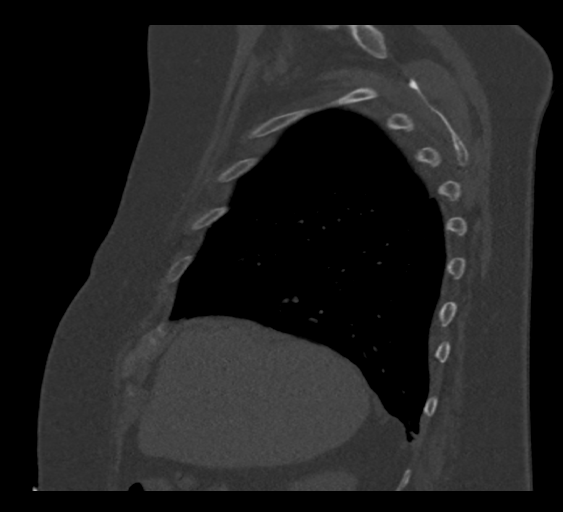
[im 88/206  bone]
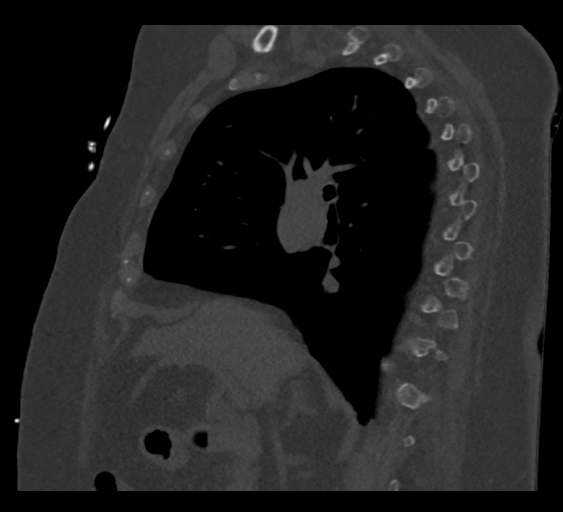
[im 118/206  bone]
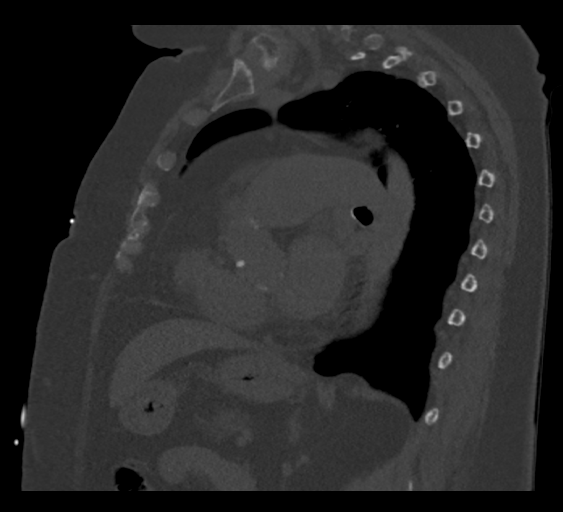
[im 147/206  bone]
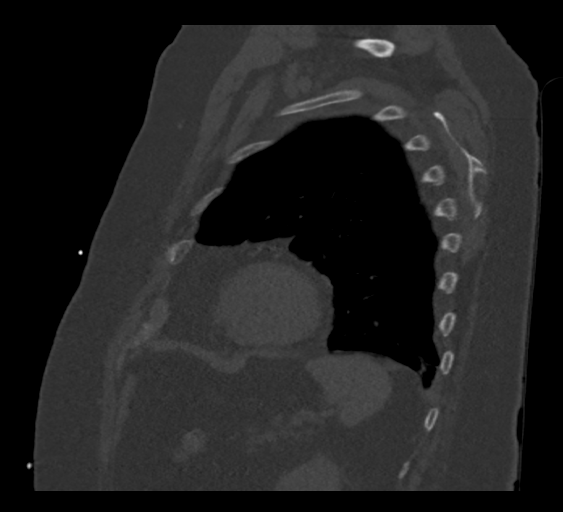

[13 of 33 positions shown; findings below may reference images not displayed]

FINDINGS: Pharynx and larynx: Oral cavity within normal limits without
discrete mass or collection. No acute inflammatory changes seen
about the dentition. Palatine tonsils symmetric and within normal
limits. Parapharyngeal fat maintained. Remainder of the oropharynx
and visualized nasopharynx within normal limits. No retropharyngeal
collection. Epiglottis within normal limits. Vallecula clear.
Remainder of the hypopharynx and supraglottic larynx within normal
limits without appreciable swelling or inflammatory changes. True
cords are opposed and not well assessed, although grossly symmetric
and within normal limits. Subglottic airway clear.

Salivary glands: Salivary glands including the parotid and
submandibular glands are within normal limits. No sialolithiasis or
acute sialoadenitis.

Thyroid: Thyroid within normal limits.

Lymph nodes: No pathologically enlarged or concerning adenopathy
seen within the neck.

Vascular: Moderate atherosclerotic change present about the aortic
arch and carotid bifurcations. Associated stenosis of up to
approximately 60% noted at the origin of the left ICA. Right carotid
artery system medialized into the retropharyngeal space.

Limited intracranial: Unremarkable.

Visualized orbits: Globes and orbital soft tissues not visualized on
this exam.

Mastoids and visualized paranasal sinuses: Visualized maxillary
sinuses are clear. Mastoid air cells and middle ear cavities not
included on this exam.

Skeleton: No acute osseous abnormality. Sclerotic lesion within the
C6 vertebral body most characteristic of a benign bone island. No
other discrete or worrisome osseous lesions. Moderate cervical
spondylosis noted at C5-6. Moderate

Upper chest: Visualized upper chest demonstrates no acute finding.

Other: None.
IMPRESSION: 1. Negative CT of the neck. No acute inflammatory changes or other
abnormality identified.
2. Aortic Atherosclerosis ([id number]). Associated approximate 60%
stenosis at the origin of the left ICA.
3. Moderate cervical spondylosis at C5-6.

## 2021-03-20 DIAGNOSIS — Z23 Encounter for immunization: Secondary | ICD-10-CM | POA: Diagnosis not present

## 2021-03-26 DIAGNOSIS — F329 Major depressive disorder, single episode, unspecified: Secondary | ICD-10-CM | POA: Diagnosis not present

## 2021-03-26 DIAGNOSIS — I119 Hypertensive heart disease without heart failure: Secondary | ICD-10-CM | POA: Diagnosis not present

## 2021-03-26 DIAGNOSIS — E039 Hypothyroidism, unspecified: Secondary | ICD-10-CM | POA: Diagnosis not present

## 2021-03-26 DIAGNOSIS — I480 Paroxysmal atrial fibrillation: Secondary | ICD-10-CM | POA: Diagnosis not present

## 2021-03-26 DIAGNOSIS — R69 Illness, unspecified: Secondary | ICD-10-CM | POA: Diagnosis not present

## 2021-03-26 DIAGNOSIS — G4733 Obstructive sleep apnea (adult) (pediatric): Secondary | ICD-10-CM | POA: Diagnosis not present

## 2021-03-26 DIAGNOSIS — E119 Type 2 diabetes mellitus without complications: Secondary | ICD-10-CM | POA: Diagnosis not present

## 2021-03-26 DIAGNOSIS — M797 Fibromyalgia: Secondary | ICD-10-CM | POA: Diagnosis not present

## 2021-03-26 DIAGNOSIS — R4689 Other symptoms and signs involving appearance and behavior: Secondary | ICD-10-CM | POA: Diagnosis not present

## 2021-04-30 DIAGNOSIS — I10 Essential (primary) hypertension: Secondary | ICD-10-CM | POA: Diagnosis not present

## 2021-04-30 DIAGNOSIS — E119 Type 2 diabetes mellitus without complications: Secondary | ICD-10-CM | POA: Diagnosis not present

## 2021-04-30 DIAGNOSIS — E039 Hypothyroidism, unspecified: Secondary | ICD-10-CM | POA: Diagnosis not present

## 2021-04-30 DIAGNOSIS — I2510 Atherosclerotic heart disease of native coronary artery without angina pectoris: Secondary | ICD-10-CM | POA: Diagnosis not present

## 2021-04-30 DIAGNOSIS — M353 Polymyalgia rheumatica: Secondary | ICD-10-CM | POA: Diagnosis not present

## 2021-04-30 DIAGNOSIS — E785 Hyperlipidemia, unspecified: Secondary | ICD-10-CM | POA: Diagnosis not present

## 2021-04-30 DIAGNOSIS — I480 Paroxysmal atrial fibrillation: Secondary | ICD-10-CM | POA: Diagnosis not present

## 2021-04-30 DIAGNOSIS — M797 Fibromyalgia: Secondary | ICD-10-CM | POA: Diagnosis not present

## 2021-06-04 DIAGNOSIS — M797 Fibromyalgia: Secondary | ICD-10-CM | POA: Diagnosis not present

## 2021-06-04 DIAGNOSIS — E785 Hyperlipidemia, unspecified: Secondary | ICD-10-CM | POA: Diagnosis not present

## 2021-06-04 DIAGNOSIS — I480 Paroxysmal atrial fibrillation: Secondary | ICD-10-CM | POA: Diagnosis not present

## 2021-06-04 DIAGNOSIS — I10 Essential (primary) hypertension: Secondary | ICD-10-CM | POA: Diagnosis not present

## 2021-06-04 DIAGNOSIS — L853 Xerosis cutis: Secondary | ICD-10-CM | POA: Diagnosis not present

## 2021-06-04 DIAGNOSIS — R21 Rash and other nonspecific skin eruption: Secondary | ICD-10-CM | POA: Diagnosis not present

## 2021-06-04 DIAGNOSIS — E119 Type 2 diabetes mellitus without complications: Secondary | ICD-10-CM | POA: Diagnosis not present

## 2021-06-04 DIAGNOSIS — E039 Hypothyroidism, unspecified: Secondary | ICD-10-CM | POA: Diagnosis not present

## 2021-06-18 DIAGNOSIS — M353 Polymyalgia rheumatica: Secondary | ICD-10-CM | POA: Diagnosis not present

## 2021-06-18 DIAGNOSIS — I10 Essential (primary) hypertension: Secondary | ICD-10-CM | POA: Diagnosis not present

## 2021-06-18 DIAGNOSIS — M797 Fibromyalgia: Secondary | ICD-10-CM | POA: Diagnosis not present

## 2021-06-18 DIAGNOSIS — I480 Paroxysmal atrial fibrillation: Secondary | ICD-10-CM | POA: Diagnosis not present

## 2021-06-18 DIAGNOSIS — E039 Hypothyroidism, unspecified: Secondary | ICD-10-CM | POA: Diagnosis not present

## 2021-06-18 DIAGNOSIS — I2510 Atherosclerotic heart disease of native coronary artery without angina pectoris: Secondary | ICD-10-CM | POA: Diagnosis not present

## 2021-06-18 DIAGNOSIS — E119 Type 2 diabetes mellitus without complications: Secondary | ICD-10-CM | POA: Diagnosis not present

## 2021-06-18 DIAGNOSIS — E785 Hyperlipidemia, unspecified: Secondary | ICD-10-CM | POA: Diagnosis not present

## 2021-07-05 DIAGNOSIS — I480 Paroxysmal atrial fibrillation: Secondary | ICD-10-CM | POA: Diagnosis not present

## 2021-07-05 DIAGNOSIS — M797 Fibromyalgia: Secondary | ICD-10-CM | POA: Diagnosis not present

## 2021-07-05 DIAGNOSIS — E785 Hyperlipidemia, unspecified: Secondary | ICD-10-CM | POA: Diagnosis not present

## 2021-07-05 DIAGNOSIS — E119 Type 2 diabetes mellitus without complications: Secondary | ICD-10-CM | POA: Diagnosis not present

## 2021-07-05 DIAGNOSIS — E039 Hypothyroidism, unspecified: Secondary | ICD-10-CM | POA: Diagnosis not present

## 2021-07-05 DIAGNOSIS — R69 Illness, unspecified: Secondary | ICD-10-CM | POA: Diagnosis not present

## 2021-07-05 DIAGNOSIS — I10 Essential (primary) hypertension: Secondary | ICD-10-CM | POA: Diagnosis not present

## 2021-07-05 DIAGNOSIS — I2510 Atherosclerotic heart disease of native coronary artery without angina pectoris: Secondary | ICD-10-CM | POA: Diagnosis not present

## 2021-07-09 DIAGNOSIS — I480 Paroxysmal atrial fibrillation: Secondary | ICD-10-CM | POA: Diagnosis not present

## 2021-07-09 DIAGNOSIS — M797 Fibromyalgia: Secondary | ICD-10-CM | POA: Diagnosis not present

## 2021-07-09 DIAGNOSIS — R059 Cough, unspecified: Secondary | ICD-10-CM | POA: Diagnosis not present

## 2021-07-09 DIAGNOSIS — E785 Hyperlipidemia, unspecified: Secondary | ICD-10-CM | POA: Diagnosis not present

## 2021-07-09 DIAGNOSIS — E119 Type 2 diabetes mellitus without complications: Secondary | ICD-10-CM | POA: Diagnosis not present

## 2021-07-09 DIAGNOSIS — I10 Essential (primary) hypertension: Secondary | ICD-10-CM | POA: Diagnosis not present

## 2021-07-09 DIAGNOSIS — E039 Hypothyroidism, unspecified: Secondary | ICD-10-CM | POA: Diagnosis not present

## 2021-07-09 DIAGNOSIS — I2510 Atherosclerotic heart disease of native coronary artery without angina pectoris: Secondary | ICD-10-CM | POA: Diagnosis not present

## 2021-07-10 DIAGNOSIS — R059 Cough, unspecified: Secondary | ICD-10-CM | POA: Diagnosis not present

## 2021-07-12 DIAGNOSIS — G4733 Obstructive sleep apnea (adult) (pediatric): Secondary | ICD-10-CM | POA: Diagnosis not present

## 2021-07-12 DIAGNOSIS — I119 Hypertensive heart disease without heart failure: Secondary | ICD-10-CM | POA: Diagnosis not present

## 2021-07-12 DIAGNOSIS — Z79899 Other long term (current) drug therapy: Secondary | ICD-10-CM | POA: Diagnosis not present

## 2021-07-12 DIAGNOSIS — E039 Hypothyroidism, unspecified: Secondary | ICD-10-CM | POA: Diagnosis not present

## 2021-07-12 DIAGNOSIS — K2100 Gastro-esophageal reflux disease with esophagitis, without bleeding: Secondary | ICD-10-CM | POA: Diagnosis not present

## 2021-07-12 DIAGNOSIS — E785 Hyperlipidemia, unspecified: Secondary | ICD-10-CM | POA: Diagnosis not present

## 2021-07-12 DIAGNOSIS — R69 Illness, unspecified: Secondary | ICD-10-CM | POA: Diagnosis not present

## 2021-07-12 DIAGNOSIS — E559 Vitamin D deficiency, unspecified: Secondary | ICD-10-CM | POA: Diagnosis not present

## 2021-07-13 DIAGNOSIS — E0822 Diabetes mellitus due to underlying condition with diabetic chronic kidney disease: Secondary | ICD-10-CM | POA: Diagnosis not present

## 2021-07-18 DIAGNOSIS — E1151 Type 2 diabetes mellitus with diabetic peripheral angiopathy without gangrene: Secondary | ICD-10-CM | POA: Diagnosis not present

## 2021-07-18 DIAGNOSIS — L84 Corns and callosities: Secondary | ICD-10-CM | POA: Diagnosis not present

## 2021-07-30 DIAGNOSIS — E559 Vitamin D deficiency, unspecified: Secondary | ICD-10-CM | POA: Diagnosis not present

## 2021-07-30 DIAGNOSIS — I2510 Atherosclerotic heart disease of native coronary artery without angina pectoris: Secondary | ICD-10-CM | POA: Diagnosis not present

## 2021-07-30 DIAGNOSIS — K2100 Gastro-esophageal reflux disease with esophagitis, without bleeding: Secondary | ICD-10-CM | POA: Diagnosis not present

## 2021-07-30 DIAGNOSIS — Z79899 Other long term (current) drug therapy: Secondary | ICD-10-CM | POA: Diagnosis not present

## 2021-07-30 DIAGNOSIS — I4891 Unspecified atrial fibrillation: Secondary | ICD-10-CM | POA: Diagnosis not present

## 2021-07-30 DIAGNOSIS — R69 Illness, unspecified: Secondary | ICD-10-CM | POA: Diagnosis not present

## 2021-07-30 DIAGNOSIS — E785 Hyperlipidemia, unspecified: Secondary | ICD-10-CM | POA: Diagnosis not present

## 2021-07-30 DIAGNOSIS — E039 Hypothyroidism, unspecified: Secondary | ICD-10-CM | POA: Diagnosis not present

## 2021-08-02 DIAGNOSIS — E039 Hypothyroidism, unspecified: Secondary | ICD-10-CM | POA: Diagnosis not present

## 2021-08-02 DIAGNOSIS — I119 Hypertensive heart disease without heart failure: Secondary | ICD-10-CM | POA: Diagnosis not present

## 2021-08-02 DIAGNOSIS — E785 Hyperlipidemia, unspecified: Secondary | ICD-10-CM | POA: Diagnosis not present

## 2021-08-02 DIAGNOSIS — K2100 Gastro-esophageal reflux disease with esophagitis, without bleeding: Secondary | ICD-10-CM | POA: Diagnosis not present

## 2021-08-02 DIAGNOSIS — K589 Irritable bowel syndrome without diarrhea: Secondary | ICD-10-CM | POA: Diagnosis not present

## 2021-08-02 DIAGNOSIS — E559 Vitamin D deficiency, unspecified: Secondary | ICD-10-CM | POA: Diagnosis not present

## 2021-08-02 DIAGNOSIS — R69 Illness, unspecified: Secondary | ICD-10-CM | POA: Diagnosis not present

## 2021-08-02 DIAGNOSIS — Z79899 Other long term (current) drug therapy: Secondary | ICD-10-CM | POA: Diagnosis not present

## 2022-04-20 ENCOUNTER — Emergency Department (HOSPITAL_COMMUNITY): Payer: Medicare (Managed Care)

## 2022-04-20 ENCOUNTER — Other Ambulatory Visit: Payer: Self-pay

## 2022-04-20 ENCOUNTER — Emergency Department (HOSPITAL_COMMUNITY)
Admission: EM | Admit: 2022-04-20 | Discharge: 2022-04-20 | Disposition: A | Discharge status: Other Acute Inpatient Hospital | Payer: Medicare (Managed Care) | Attending: Emergency Medicine | Admitting: Emergency Medicine

## 2022-04-20 ENCOUNTER — Encounter (HOSPITAL_COMMUNITY): Payer: Self-pay

## 2022-04-20 DIAGNOSIS — J441 Chronic obstructive pulmonary disease with (acute) exacerbation: Secondary | ICD-10-CM | POA: Diagnosis not present

## 2022-04-20 DIAGNOSIS — J449 Chronic obstructive pulmonary disease, unspecified: Secondary | ICD-10-CM | POA: Diagnosis not present

## 2022-04-20 DIAGNOSIS — R0602 Shortness of breath: Secondary | ICD-10-CM | POA: Diagnosis present

## 2022-04-20 DIAGNOSIS — Z7951 Long term (current) use of inhaled steroids: Secondary | ICD-10-CM | POA: Insufficient documentation

## 2022-04-20 DIAGNOSIS — I2510 Atherosclerotic heart disease of native coronary artery without angina pectoris: Secondary | ICD-10-CM | POA: Insufficient documentation

## 2022-04-20 LAB — CBC WITH DIFFERENTIAL/PLATELET
Abs Immature Granulocytes: 0.02 10*3/uL (ref 0.00–0.07)
Basophils Absolute: 0 10*3/uL (ref 0.0–0.1)
Basophils Relative: 0 %
Eosinophils Absolute: 0.3 10*3/uL (ref 0.0–0.5)
Eosinophils Relative: 3 %
HCT: 36.4 % (ref 36.0–46.0)
Hemoglobin: 10.9 g/dL — ABNORMAL LOW (ref 12.0–15.0)
Immature Granulocytes: 0 %
Lymphocytes Relative: 25 %
Lymphs Abs: 2.2 10*3/uL (ref 0.7–4.0)
MCH: 25.1 pg — ABNORMAL LOW (ref 26.0–34.0)
MCHC: 29.9 g/dL — ABNORMAL LOW (ref 30.0–36.0)
MCV: 83.7 fL (ref 80.0–100.0)
Monocytes Absolute: 0.8 10*3/uL (ref 0.1–1.0)
Monocytes Relative: 9 %
Neutro Abs: 5.4 10*3/uL (ref 1.7–7.7)
Neutrophils Relative %: 63 %
Platelets: 311 10*3/uL (ref 150–400)
RBC: 4.35 MIL/uL (ref 3.87–5.11)
RDW: 16.3 % — ABNORMAL HIGH (ref 11.5–15.5)
WBC: 8.8 10*3/uL (ref 4.0–10.5)
nRBC: 0 % (ref 0.0–0.2)

## 2022-04-20 LAB — COMPREHENSIVE METABOLIC PANEL
ALT: 10 U/L (ref 0–44)
AST: 15 U/L (ref 15–41)
Albumin: 3.1 g/dL — ABNORMAL LOW (ref 3.5–5.0)
Alkaline Phosphatase: 53 U/L (ref 38–126)
Anion gap: 7 (ref 5–15)
BUN: 21 mg/dL (ref 8–23)
CO2: 33 mmol/L — ABNORMAL HIGH (ref 22–32)
Calcium: 8.7 mg/dL — ABNORMAL LOW (ref 8.9–10.3)
Chloride: 101 mmol/L (ref 98–111)
Creatinine, Ser: 0.99 mg/dL (ref 0.44–1.00)
GFR, Estimated: 57 mL/min — ABNORMAL LOW (ref >60)
Glucose, Bld: 106 mg/dL — ABNORMAL HIGH (ref 70–99)
Potassium: 3.8 mmol/L (ref 3.5–5.1)
Sodium: 141 mmol/L (ref 135–145)
Total Bilirubin: 0.3 mg/dL (ref 0.3–1.2)
Total Protein: 6.9 g/dL (ref 6.5–8.1)

## 2022-04-20 LAB — BRAIN NATRIURETIC PEPTIDE: B Natriuretic Peptide: 106 pg/mL — ABNORMAL HIGH (ref 0.0–100.0)

## 2022-04-20 MED ORDER — ALBUTEROL SULFATE (2.5 MG/3ML) 0.083% IN NEBU
2.5000 mg | INHALATION_SOLUTION | Freq: Once | RESPIRATORY_TRACT | Status: AC
  Administered 2022-04-20: 2.5 mg via RESPIRATORY_TRACT
  Filled 2022-04-20: qty 3

## 2022-04-20 MED ORDER — METHYLPREDNISOLONE SODIUM SUCC 125 MG IJ SOLR
125.0000 mg | Freq: Once | INTRAMUSCULAR | Status: AC
  Administered 2022-04-20: 125 mg via INTRAVENOUS
  Filled 2022-04-20: qty 2

## 2022-04-20 MED ORDER — IPRATROPIUM-ALBUTEROL 0.5-2.5 (3) MG/3ML IN SOLN
3.0000 mL | Freq: Once | RESPIRATORY_TRACT | Status: AC
  Administered 2022-04-20: 3 mL via RESPIRATORY_TRACT
  Filled 2022-04-20: qty 3

## 2022-04-20 NOTE — Discharge Instructions (Signed)
Continue the albuterol every 6 hours as needed.  Follow-up with your doctor if any problems

## 2022-04-20 NOTE — ED Triage Notes (Signed)
Patient sent from [facility] due to family concern for sudden onset of SHOB. [facility] staff eheard patient yell and was found 89% on RA. Patient placed on 2L/[place] and sat 96% and then given duo neb. Patient states she is in A-fib. Per staff after treatment patient was 96% on RA. Patient's family wanted the patient transferred to ED.

## 2022-04-20 NOTE — ED Provider Notes (Signed)
[facility] EMERGENCY DEPARTMENT Provider Note   CSN: [id number] Arrival date & time: 04/20/22  1625     History {Add pertinent medical, surgical, social history, OB history to HPI:1} Chief Complaint  Patient presents with   Shortness of Breath    [name] is a 81 y.o. female.  Patient has a history of coronary artery disease and COPD.  She had some shortness of breath at the nursing home was given albuterol inhaler.  She feels better now   Shortness of Breath      Home Medications Prior to Admission medications   Medication Sig Start Date End Date Taking? Authorizing Provider  acetaminophen (TYLENOL) 500 MG tablet Take 500 mg by mouth daily as needed for headache.    [provider]  albuterol (PROVENTIL) (2.5 MG/3ML) 0.083% nebulizer solution Take 3 mLs (2.5 mg total) by nebulization every 6 (six) hours as needed for wheezing or shortness of breath. 06/05/16   Mikey Kirschner, MD  albuterol (VENTOLIN HFA) 108 (90 Base) MCG/ACT inhaler INHALE 2 PUFFS INTO THE LUNGS 4 TIMES DAILY AS NEEDED FOR WHEEZING. 03/04/18   Mikey Kirschner, MD  Cholecalciferol (VITAMIN D3) 1000 units CAPS Take 1 capsule every evening by mouth.     [provider]  conjugated estrogens (PREMARIN) vaginal cream Place 1 Applicatorful vaginally 2 (two) times a week. On Mondays and Fridays    [provider]  diltiazem (CARDIZEM CD) 120 MG 24 hr capsule Take 1 capsule (120 mg total) by mouth daily. 05/21/19   Orson Eva, MD  diphenoxylate-atropine (LOMOTIL) 2.5-0.025 MG tablet TAKE ONE TABLET BY MOUTH 2 TIMES A DAY AS NEEDED. 08/10/18   Setzer, Rona Ravens, NP  escitalopram (LEXAPRO) 20 MG tablet TAKE ONE TABLET BY MOUTH ONCE DAILY. 03/04/18   Mikey Kirschner, MD  FLOVENT DISKUS 100 MCG/BLIST AEPB Inhale 2 puffs into the lungs in the morning and at bedtime. 01/06/20   [provider]  furosemide (LASIX) 40 MG tablet TAKE ONE TABLET BY MOUTH DAILY. 08/18/18   Herminio Commons, MD  hydrocortisone cream 1 % Apply 1 application topically 2 (two) times daily.    [provider]  levothyroxine (SYNTHROID) 50 MCG tablet Take 50 mcg by mouth daily before breakfast.    [provider]  lidocaine (XYLOCAINE) 2 % solution Use as directed 10 mLs in the mouth or throat 4 (four) times daily -  with meals and at bedtime. 05/20/19   Orson Eva, MD  magnesium oxide (MAG-OX) 400 MG tablet Take 400 mg by mouth daily.    [provider]  meclizine (ANTIVERT) 25 MG tablet Take 1 tablet (25 mg total) by mouth 3 (three) times daily as needed for dizziness. 04/14/18   Mikey Kirschner, MD  metoCLOPramide (REGLAN) 5 MG tablet Take 1 tablet (5 mg total) by mouth 3 (three) times daily before meals. 05/21/19   Orson Eva, MD  nitroGLYCERIN (NITROLINGUAL) 0.4 MG/SPRAY spray Place 1 spray under the tongue every 5 (five) minutes x 3 doses as needed for chest pain. 06/04/16   Herminio Commons, MD  pantoprazole (PROTONIX) 40 MG tablet Take 1 tablet (40 mg total) by mouth 2 (two) times daily before a meal. X 1 month, then once daily forever 05/21/19   Orson Eva, MD  Potassium Chloride ER 20 MEQ TBCR One po daily 08/15/17   Mikey Kirschner, MD  rivaroxaban (XARELTO) 20 MG TABS tablet Take 20 mg by mouth daily with supper.  [provider]  rosuvastatin (CRESTOR) 20 MG tablet Take 20 mg by mouth daily.    [provider]  simethicone (MYLICON) 297 MG chewable tablet Chew 125 mg by mouth 3 (three) times daily with meals.    [provider]  triamcinolone cream (KENALOG) 0.1 % Apply 1 application topically 2 (two) times daily. 08/18/18   Mikey Kirschner, MD      Allergies    Cefprozil, Demerol, and Meperidine hcl    Review of Systems   Review of Systems  Respiratory:  Positive for shortness of breath.     Physical Exam Updated Vital Signs BP 125/73 (BP Location: Left Arm)   Pulse 64   Temp 97.7 F (36.5 C) (Oral)   Resp 20    Ht '5\' 1"'$  (1.549 m)   Wt 99.8 kg   SpO2 95%   BMI 41.57 kg/m  Physical Exam  ED Results / Procedures / Treatments   Labs (all labs ordered are listed, but only abnormal results are displayed) Labs Reviewed  CBC WITH DIFFERENTIAL/PLATELET - Abnormal; Notable for the following components:      Result Value   Hemoglobin 10.9 (*)    MCH 25.1 (*)    MCHC 29.9 (*)    RDW 16.3 (*)    All other components within normal limits  COMPREHENSIVE METABOLIC PANEL - Abnormal; Notable for the following components:   CO2 33 (*)    Glucose, Bld 106 (*)    Calcium 8.7 (*)    Albumin 3.1 (*)    GFR, Estimated 57 (*)    All other components within normal limits  BRAIN NATRIURETIC PEPTIDE - Abnormal; Notable for the following components:   B Natriuretic Peptide 106.0 (*)    All other components within normal limits    EKG None  Radiology DG Chest Port 1 View  Result Date: 04/20/2022 CLINICAL DATA:  Shortness of breath. EXAM: PORTABLE CHEST 1 VIEW COMPARISON:  May 15, 2019 FINDINGS: Stable mild cardiomegaly. The hila and mediastinum are unchanged. No pneumothorax. The right lung is clear. There appears to be a small left pleural effusion with underlying atelectasis. No other abnormalities. IMPRESSION: There appears to be a small left pleural effusion with underlying atelectasis. No other acute abnormalities. Electronically Signed   By: Dorise Bullion III M.D.   On: 04/20/2022 17:10    Procedures Procedures  {Document cardiac monitor, telemetry assessment procedure when appropriate:1}  Medications Ordered in ED Medications  ipratropium-albuterol (DUONEB) 0.5-2.5 (3) MG/3ML nebulizer solution 3 mL (3 mLs Nebulization Given 04/20/22 1744)  albuterol (PROVENTIL) (2.5 MG/3ML) 0.083% nebulizer solution 2.5 mg (2.5 mg Nebulization Given 04/20/22 1744)  methylPREDNISolone sodium succinate (SOLU-MEDROL) 125 mg/2 mL injection 125 mg (125 mg Intravenous Given 04/20/22 1725)    ED Course/  Medical Decision Making/ A&P  Patient was given an albuterol neb in the emergency department.  Her room air sats were normal now and she will be discharged home                         Medical Decision Making Amount and/or Complexity of Data Reviewed Labs: ordered. Radiology: ordered.  Risk Prescription drug management.   Mild exacerbation of COPD  {Document critical care time when appropriate:1} {Document review of labs and clinical decision tools ie heart score, Chads2Vasc2 etc:1}  {Document your independent review of radiology images, and any outside records:1} {Document your discussion with family members, caretakers, and with consultants:1} {Document social determinants of  health affecting pt's care:1} {Document your decision making why or why not admission, treatments were needed:1} Final Clinical Impression(s) / ED Diagnoses Final diagnoses:  COPD exacerbation ([place])    Rx / DC Orders ED Discharge Orders     None

## 2022-04-20 NOTE — ED Notes (Signed)
Update given to niece, Lorin Picket, per patient's approval. Niece can be updated if changes or needs occur at [phone].

## 2022-04-20 NOTE — ED Notes (Signed)
Gave report to facility.

## 2023-01-05 ENCOUNTER — Other Ambulatory Visit: Payer: Self-pay

## 2023-01-05 ENCOUNTER — Encounter (HOSPITAL_COMMUNITY): Payer: Self-pay | Admitting: Emergency Medicine

## 2023-01-05 ENCOUNTER — Emergency Department (HOSPITAL_COMMUNITY): Payer: Medicare (Managed Care)

## 2023-01-05 ENCOUNTER — Emergency Department (HOSPITAL_COMMUNITY)
Admission: EM | Admit: 2023-01-05 | Discharge: 2023-01-06 | Disposition: A | Discharge status: Home / Self Care | Payer: Medicare (Managed Care) | Attending: Emergency Medicine | Admitting: Emergency Medicine

## 2023-01-05 DIAGNOSIS — M79672 Pain in left foot: Secondary | ICD-10-CM | POA: Insufficient documentation

## 2023-01-05 DIAGNOSIS — I2510 Atherosclerotic heart disease of native coronary artery without angina pectoris: Secondary | ICD-10-CM | POA: Insufficient documentation

## 2023-01-05 DIAGNOSIS — I10 Essential (primary) hypertension: Secondary | ICD-10-CM | POA: Diagnosis not present

## 2023-01-05 DIAGNOSIS — W06XXXA Fall from bed, initial encounter: Secondary | ICD-10-CM | POA: Insufficient documentation

## 2023-01-05 DIAGNOSIS — Z7901 Long term (current) use of anticoagulants: Secondary | ICD-10-CM | POA: Insufficient documentation

## 2023-01-05 DIAGNOSIS — Z8673 Personal history of transient ischemic attack (TIA), and cerebral infarction without residual deficits: Secondary | ICD-10-CM | POA: Diagnosis not present

## 2023-01-05 DIAGNOSIS — J168 Pneumonia due to other specified infectious organisms: Secondary | ICD-10-CM | POA: Diagnosis not present

## 2023-01-05 DIAGNOSIS — J45909 Unspecified asthma, uncomplicated: Secondary | ICD-10-CM | POA: Insufficient documentation

## 2023-01-05 DIAGNOSIS — E119 Type 2 diabetes mellitus without complications: Secondary | ICD-10-CM | POA: Insufficient documentation

## 2023-01-05 DIAGNOSIS — I6782 Cerebral ischemia: Secondary | ICD-10-CM | POA: Diagnosis not present

## 2023-01-05 DIAGNOSIS — W19XXXA Unspecified fall, initial encounter: Secondary | ICD-10-CM

## 2023-01-05 DIAGNOSIS — M545 Low back pain, unspecified: Secondary | ICD-10-CM | POA: Diagnosis not present

## 2023-01-05 DIAGNOSIS — J189 Pneumonia, unspecified organism: Secondary | ICD-10-CM

## 2023-01-05 DIAGNOSIS — Z87891 Personal history of nicotine dependence: Secondary | ICD-10-CM | POA: Diagnosis not present

## 2023-01-05 DIAGNOSIS — Z79899 Other long term (current) drug therapy: Secondary | ICD-10-CM | POA: Diagnosis not present

## 2023-01-05 MED ORDER — AZITHROMYCIN 250 MG PO TABS: 250.0000 mg | ORAL_TABLET | Freq: Every day | ORAL | 0 refills | Status: DC

## 2023-01-05 MED ORDER — AMOXICILLIN 500 MG PO CAPS: 500.0000 mg | ORAL_CAPSULE | Freq: Three times a day (TID) | ORAL | 0 refills | Status: AC

## 2023-01-05 NOTE — ED Triage Notes (Signed)
Pt bib EMS from [facility] after she fell out of her bed. Pt with c/o back pain and bilateral leg pain.

## 2023-01-05 NOTE — ED Provider Notes (Signed)
[facility] EMERGENCY DEPARTMENT AT [facility] Provider Note   CSN: [id number] Arrival date & time: 01/05/23  1948     History  Chief Complaint  Patient presents with   Fall    [name] is a 82 y.o. female.   Fall   82 year old female presents emergency department after fall.  Patient from [facility].  States that she went to stand up to get out of her bed when she slipped and fell landing on her left foot.  Currently complaining of low back pain, left foot pain.  Denies any known trauma to head or loss of consciousness.  Denies any chest pain, shortness of breath, abdominal pain, nausea, vomiting, urinary symptoms, change in bowel habits.  Patient very hard of hearing and difficult to obtain detailed history from.   Past medical history significant for hyperlipidemia, CAD, NSTEMI, paroxysmal atrial fibrillation on Xarelto, PE, diabetes mellitus type 2, hypertension, asthma, arthritis, IBS  Home Medications Prior to Admission medications   Medication Sig Start Date End Date Taking? Authorizing Provider  amoxicillin (AMOXIL) 500 MG capsule Take 1 capsule (500 mg total) by mouth 3 (three) times daily for 5 days. 01/05/23 01/10/23 Yes Sherian Maroon A, PA  azithromycin (ZITHROMAX) 250 MG tablet Take 1 tablet (250 mg total) by mouth daily. Take first 2 tablets together, then 1 every day until finished. 01/05/23  Yes Sherian Maroon A, PA  acetaminophen (TYLENOL) 500 MG tablet Take 500 mg by mouth daily as needed for headache.    [provider]  albuterol (PROVENTIL) (2.5 MG/3ML) 0.083% nebulizer solution Take 3 mLs (2.5 mg total) by nebulization every 6 (six) hours as needed for wheezing or shortness of breath. 06/05/16   Merlyn Albert, MD  albuterol (VENTOLIN HFA) 108 (90 Base) MCG/ACT inhaler INHALE 2 PUFFS INTO THE LUNGS 4 TIMES DAILY AS NEEDED FOR WHEEZING. 03/04/18   Merlyn Albert, MD  Cholecalciferol (VITAMIN D3) 1000 units CAPS Take 1 capsule  every evening by mouth.     [provider]  conjugated estrogens (PREMARIN) vaginal cream Place 1 Applicatorful vaginally 2 (two) times a week. On Mondays and Fridays    [provider]  diltiazem (CARDIZEM CD) 120 MG 24 hr capsule Take 1 capsule (120 mg total) by mouth daily. 05/21/19   Catarina Hartshorn, MD  diphenoxylate-atropine (LOMOTIL) 2.5-0.025 MG tablet TAKE ONE TABLET BY MOUTH 2 TIMES A DAY AS NEEDED. 08/10/18   Setzer, Brand Males, NP  escitalopram (LEXAPRO) 20 MG tablet TAKE ONE TABLET BY MOUTH ONCE DAILY. 03/04/18   Merlyn Albert, MD  FLOVENT DISKUS 100 MCG/BLIST AEPB Inhale 2 puffs into the lungs in the morning and at bedtime. 01/06/20   [provider]  furosemide (LASIX) 40 MG tablet TAKE ONE TABLET BY MOUTH DAILY. 08/18/18   Laqueta Linden, MD  hydrocortisone cream 1 % Apply 1 application topically 2 (two) times daily.    [provider]  levothyroxine (SYNTHROID) 50 MCG tablet Take 50 mcg by mouth daily before breakfast.    [provider]  lidocaine (XYLOCAINE) 2 % solution Use as directed 10 mLs in the mouth or throat 4 (four) times daily -  with meals and at bedtime. 05/20/19   Catarina Hartshorn, MD  magnesium oxide (MAG-OX) 400 MG tablet Take 400 mg by mouth daily.    [provider]  meclizine (ANTIVERT) 25 MG tablet Take 1 tablet (25 mg total) by mouth 3 (three) times daily as needed for dizziness.  04/14/18   Merlyn Albert, MD  metoCLOPramide (REGLAN) 5 MG tablet Take 1 tablet (5 mg total) by mouth 3 (three) times daily before meals. 05/21/19   Catarina Hartshorn, MD  nitroGLYCERIN (NITROLINGUAL) 0.4 MG/SPRAY spray Place 1 spray under the tongue every 5 (five) minutes x 3 doses as needed for chest pain. 06/04/16   Laqueta Linden, MD  pantoprazole (PROTONIX) 40 MG tablet Take 1 tablet (40 mg total) by mouth 2 (two) times daily before a meal. X 1 month, then once daily forever 05/21/19   Catarina Hartshorn, MD  Potassium Chloride ER 20 MEQ  TBCR One po daily 08/15/17   Merlyn Albert, MD  rivaroxaban (XARELTO) 20 MG TABS tablet Take 20 mg by mouth daily with supper.    [provider]  rosuvastatin (CRESTOR) 20 MG tablet Take 20 mg by mouth daily.    [provider]  simethicone (MYLICON) 125 MG chewable tablet Chew 125 mg by mouth 3 (three) times daily with meals.    [provider]  triamcinolone cream (KENALOG) 0.1 % Apply 1 application topically 2 (two) times daily. 08/18/18   Merlyn Albert, MD      Allergies    Cefprozil, Demerol, and Meperidine hcl    Review of Systems   Review of Systems  All other systems reviewed and are negative.   Physical Exam Updated Vital Signs BP 132/80 (BP Location: Left Arm)   Pulse 88   Temp 97.6 F (36.4 C) (Oral)   Resp 18   Ht 5\' 1"  (1.549 m)   Wt 100 kg   SpO2 95%   BMI 41.66 kg/m  Physical Exam Vitals and nursing note reviewed.  Constitutional:      General: She is not in acute distress.    Appearance: She is well-developed.  HENT:     Head: Normocephalic and atraumatic.  Eyes:     Conjunctiva/sclera: Conjunctivae normal.  Cardiovascular:     Rate and Rhythm: Normal rate and regular rhythm.  Pulmonary:     Effort: Pulmonary effort is normal. No respiratory distress.     Breath sounds: Normal breath sounds. No wheezing, rhonchi or rales.  Abdominal:     Palpations: Abdomen is soft.     Tenderness: There is no abdominal tenderness.  Musculoskeletal:        General: No swelling.     Cervical back: Neck supple.     Right lower leg: No edema.     Left lower leg: No edema.     Comments: No midline tenderness of cervical spine or thoracic spine but with midline tenderness in lumbar spine.  Paraspinal tenderness noted bilateral lumbar region.  Patient able to move all 4 extremities without tenderness to palpation of upper or lower extremities.  Tender to palpation diffusely of left ankle/foot as well as proximal pain of bilateral hips but  otherwise, lower extremities without tenderness to palpation.  Pedal and radial pulses 2+.  Skin:    General: Skin is warm and dry.     Capillary Refill: Capillary refill takes less than 2 seconds.  Neurological:     Mental Status: She is alert.     Comments: Alert and oriented to self, place and event.   Speech is fluent, clear without dysarthria or dysphasia.   Strength symmetric in upper/lower extremities   Sensation intact in upper/lower extremities   Gait unable to be assessed due to patient's foot pain. CN I not tested  CN II not tested  CN III, IV, VI PERRLA and EOMs intact bilaterally  CN V Intact sensation to sharp and light touch to the face  CN VII facial movements symmetric  CN VIII not tested  CN IX, X no uvula deviation, symmetric rise of soft palate  CN XI symmetric SCM and trapezius strength bilaterally  CN XII Midline tongue protrusion, symmetric L/R movements     Psychiatric:        Mood and Affect: Mood normal.     ED Results / Procedures / Treatments   Labs (all labs ordered are listed, but only abnormal results are displayed) Labs Reviewed - No data to display  EKG None  Radiology CT Cervical Spine Wo Contrast  Result Date: 01/05/2023 CLINICAL DATA:  Trauma EXAM: CT CERVICAL SPINE WITHOUT CONTRAST TECHNIQUE: Multidetector CT imaging of the cervical spine was performed without intravenous contrast. Multiplanar CT image reconstructions were also generated. RADIATION DOSE REDUCTION: This exam was performed according to the departmental dose-optimization program which includes automated exposure control, adjustment of the mA and/or kV according to patient size and/or use of iterative reconstruction technique. COMPARISON:  None Available. FINDINGS: Alignment: Straightening of the cervical spine. No subluxation. Facet within normal limits. Skull base and vertebrae: No acute fracture. No primary bone lesion or focal pathologic process. Soft tissues and spinal  canal: No prevertebral fluid or swelling. No visible canal hematoma. Disc levels: Mild to moderate disc space narrowing C4-C5 and C5-C6. Sclerotic lesion C6 most likely bone island. Facet degenerative changes at multiple levels. Moderate bilateral foraminal narrowing C5-C6. Upper chest: Negative. Other: None IMPRESSION: Straightening of the cervical spine with degenerative changes. No acute osseous abnormality. Electronically Signed   By: Jasmine Pang M.D.   On: 01/05/2023 22:45   CT Head Wo Contrast  Result Date: 01/05/2023 CLINICAL DATA:  Head trauma fall EXAM: CT HEAD WITHOUT CONTRAST TECHNIQUE: Contiguous axial images were obtained from the base of the skull through the vertex without intravenous contrast. RADIATION DOSE REDUCTION: This exam was performed according to the departmental dose-optimization program which includes automated exposure control, adjustment of the mA and/or kV according to patient size and/or use of iterative reconstruction technique. COMPARISON:  None Available. FINDINGS: Brain: No acute territorial infarction, hemorrhage or intracranial mass. Encephalomalacia within the right occipital and posterior temporal lobe consistent with remote PCA infarct. Mild ex vacuo dilatation of the right posterolateral ventricle. Atrophy and mild chronic small vessel ischemic changes of the white matter Vascular: No hyperdense vessels.  Carotid vascular calcification Skull: Normal. Negative for fracture or focal lesion. Sinuses/Orbits: No acute finding. Other: None IMPRESSION: 1. No CT evidence for acute intracranial abnormality. 2. Atrophy and chronic small vessel ischemic changes of the white matter. 3. Remote right PCA infarct. Electronically Signed   By: Jasmine Pang M.D.   On: 01/05/2023 22:38   DG Lumbar Spine Complete  Result Date: 01/05/2023 CLINICAL DATA:  Back pain EXAM: LUMBAR SPINE - COMPLETE 4+ VIEW COMPARISON:  12/16/2012 FINDINGS: Lumbar alignment within normal limits. Vertebral  body heights are maintained. Advanced disc space narrowing and degenerative change at L5-S1. Mild multilevel degenerative osteophytes. Aortic atherosclerosis IMPRESSION: Degenerative changes most advanced at L5-S1. Electronically Signed   By: Jasmine Pang M.D.   On: 01/05/2023 22:32   DG Foot Complete Left  Result Date: 01/05/2023 CLINICAL DATA:  Pain EXAM: LEFT FOOT - COMPLETE 3+ VIEW COMPARISON:  None Available. FINDINGS: Heterogenous ill-defined sclerosis throughout the bones of the foot and ankle is nonspecific but may be due to demineralization. No  definite acute fracture. No dislocation. Degenerative arthritis about the midfoot and IP joints. Calcaneal spurs. Vascular calcifications. Soft tissues are radiographically unremarkable. IMPRESSION: Heterogenous ill-defined sclerosis throughout the bones of the foot and ankle is nonspecific but may be due to demineralization. No definite acute fracture. Electronically Signed   By: Minerva Fester M.D.   On: 01/05/2023 22:31   DG Hip Unilat With Pelvis 2-3 Views Left  Result Date: 01/05/2023 CLINICAL DATA:  Cough and pain EXAM: DG HIP (WITH OR WITHOUT PELVIS) 2-3V LEFT COMPARISON:  None Available. FINDINGS: Lateral views are limited secondary to technique and overlapping structures. There is no evidence of hip fracture or dislocation. There is no evidence of arthropathy or other focal bone abnormality. IMPRESSION: No acute fracture or dislocation. Electronically Signed   By: Darliss Cheney M.D.   On: 01/05/2023 22:19   DG Chest Port 1 View  Result Date: 01/05/2023 CLINICAL DATA:  Cough EXAM: PORTABLE CHEST 1 VIEW COMPARISON:  04/20/2022 FINDINGS: Enlarged cardiac silhouette, prominent pericardial fat pads are noted on prior CT. Opacity at the peripheral left lung base. Aortic atherosclerosis. No pneumothorax IMPRESSION: Enlarged cardiac silhouette. Opacity at the peripheral left lung base, possible pneumonia, imaging follow-up to resolution is recommended  Electronically Signed   By: Jasmine Pang M.D.   On: 01/05/2023 22:13    Procedures Procedures    Medications Ordered in ED Medications - No data to display  ED Course/ Medical Decision Making/ A&P Clinical Course as of 01/05/23 2328  Sun Jan 05, 2023  2350 82 year old female sent in from facility after a fall.  Complaining of back pain.  She is moving all her extremities here.  No distress.  Getting x-rays and CT imaging.  Disposition per results of testing. [MB]  2324 CT Cervical Spine Wo Contrast [CR]    Clinical Course User Index [CR] Peter Garter, PA [MB] Terrilee Files, MD                             Medical Decision Making Amount and/or Complexity of Data Reviewed Radiology: ordered.  Risk Prescription drug management.   This patient presents to the ED for concern of fall, this involves an extensive number of treatment options, and is a complaint that carries with it a high risk of complications and morbidity.  The differential diagnosis includes fracture, strain/pain, dislocation, ligamentous/tendinous injury, neurovascular compromise, CVA, pneumothorax, solid organ damage   Co morbidities that complicate the patient evaluation  See HPI   Additional history obtained:  Additional history obtained from EMR External records from outside source obtained and reviewed including hospital records   Lab Tests:  N/a   Imaging Studies ordered:  I ordered imaging studies including CT head/cervical spine, lumbar x-ray, chest x-ray, pelvis with left hip x-ray, left foot x-ray I independently visualized and interpreted imaging which showed  CT head/cervical spine: No acute intracranial abnormality.  Atrophy and chronic small vessel ischemic changes of white matter.  Remote right PCA infarct.  No acute fracture or traumatic subluxation of cervical spine. Lumbar x-ray: Degenerative changes of L5-S1 but otherwise no acute abnormality Chest x-ray: Enlarged cardiac  silhouette.  Opacity in peripheral left lung base possible pneumonia. Pelvis with left hip x-ray: No acute fracture/dislocation Left foot x-ray: Heterogeneous ill-defined sclerosis through bones of the foot and ankle.  No acute fracture. I agree with the radiologist interpretation   Cardiac Monitoring: / EKG:  The patient was maintained on a  cardiac monitor.  I personally viewed and interpreted the cardiac monitored which showed an underlying rhythm of: Sinus rhythm   Consultations Obtained:  I requested consultation with attending physician Dr. Charm Barges who is in agreement with treatment plan going forward   Problem List / ED Course / Critical interventions / Medication management  Fall, pneumonia Reevaluation of the patient showed that the patient stayed the same I have reviewed the patients home medicines and have made adjustments as needed   Social Determinants of Health:  Former cigarette use.  Denies illicit drug use.   Test / Admission - Considered:  Fall, pneumonia Vitals signs within normal range and stable throughout visit. Laboratory/imaging studies significant for: See above 82 year old female presents emergency department after mechanical fall from [facility].  From traumatic reactive, workup today overall reassuring without evidence of acute traumatic injury on imaging studies obtained.  Incidentally, patient was found with evidence of possible left lower lobe pneumonia of which will be placed on antibiotics in the outpatient setting.  Patient without fever, tachycardia, tachypnea, hypoxia so laboratory studies for incidental finding of pneumonia deemed necessary at this time.  Will trial p.o. antibiotics have her follow-up with primary care in the outpatient setting for further evaluation/assessment of symptoms.  Patient reassured by overall negative traumatic workup.  Recommend Tylenol at home for pain and follow-up with primary care for reassessment of symptoms.   Treatment plan discussed at length with patient and she acknowledged understanding was agreeable to said plan.  Patient overall well-appearing, afebrile in no acute distress. Worrisome signs and symptoms were discussed with the patient, and the patient acknowledged understanding to return to the ED if noticed. Patient was stable upon discharge.          Final Clinical Impression(s) / ED Diagnoses Final diagnoses:  Fall, initial encounter  Pneumonia due to infectious organism, unspecified laterality, unspecified part of lung    Rx / DC Orders ED Discharge Orders          Ordered    amoxicillin (AMOXIL) 500 MG capsule  3 times daily        01/05/23 2256    azithromycin (ZITHROMAX) 250 MG tablet  Daily        01/05/23 2256              Peter Garter, [place] 01/05/23 2328    Terrilee Files, MD 01/06/23 [phone]

## 2023-01-05 NOTE — ED Notes (Signed)
Notified [place] [facility] of patient needing transportation back to [facility]. 

## 2023-01-05 NOTE — Discharge Instructions (Signed)
As discussed, your workup today was overall reassuring.  No evidence of fracture, dislocation or bleed.  Your chest x-ray did show signs of pneumonia so we will send in antibiotics and for treatment of this.  Recommend follow-up with primary care within 2 to 3 days for reassessment of your symptoms.  Please do not hesitate to return to emergency department if the worrisome signs and symptoms we discussed become apparent.

## 2023-01-06 NOTE — ED Notes (Signed)
Incontinent care provided.

## 2023-07-05 ENCOUNTER — Encounter (HOSPITAL_COMMUNITY): Payer: Self-pay

## 2023-07-05 ENCOUNTER — Emergency Department (HOSPITAL_COMMUNITY): Payer: Medicare (Managed Care)

## 2023-07-05 ENCOUNTER — Emergency Department (HOSPITAL_COMMUNITY)
Admission: EM | Admit: 2023-07-05 | Discharge: 2023-07-06 | Disposition: A | Discharge status: Home / Self Care | Payer: Medicare (Managed Care) | Attending: Emergency Medicine | Admitting: Emergency Medicine

## 2023-07-05 ENCOUNTER — Other Ambulatory Visit: Payer: Self-pay

## 2023-07-05 DIAGNOSIS — E119 Type 2 diabetes mellitus without complications: Secondary | ICD-10-CM | POA: Diagnosis not present

## 2023-07-05 DIAGNOSIS — Z7901 Long term (current) use of anticoagulants: Secondary | ICD-10-CM | POA: Diagnosis not present

## 2023-07-05 DIAGNOSIS — N3000 Acute cystitis without hematuria: Secondary | ICD-10-CM | POA: Diagnosis not present

## 2023-07-05 DIAGNOSIS — Z79899 Other long term (current) drug therapy: Secondary | ICD-10-CM | POA: Diagnosis not present

## 2023-07-05 DIAGNOSIS — Y92129 Unspecified place in nursing home as the place of occurrence of the external cause: Secondary | ICD-10-CM | POA: Insufficient documentation

## 2023-07-05 DIAGNOSIS — W19XXXA Unspecified fall, initial encounter: Secondary | ICD-10-CM | POA: Diagnosis not present

## 2023-07-05 DIAGNOSIS — J449 Chronic obstructive pulmonary disease, unspecified: Secondary | ICD-10-CM | POA: Diagnosis not present

## 2023-07-05 DIAGNOSIS — S59911A Unspecified injury of right forearm, initial encounter: Secondary | ICD-10-CM | POA: Diagnosis present

## 2023-07-05 DIAGNOSIS — S5011XA Contusion of right forearm, initial encounter: Secondary | ICD-10-CM | POA: Insufficient documentation

## 2023-07-05 DIAGNOSIS — Z7951 Long term (current) use of inhaled steroids: Secondary | ICD-10-CM | POA: Diagnosis not present

## 2023-07-05 DIAGNOSIS — I10 Essential (primary) hypertension: Secondary | ICD-10-CM | POA: Insufficient documentation

## 2023-07-05 LAB — CBC WITH DIFFERENTIAL/PLATELET
Abs Immature Granulocytes: 0.08 10*3/uL — ABNORMAL HIGH (ref 0.00–0.07)
Basophils Absolute: 0 10*3/uL (ref 0.0–0.1)
Basophils Relative: 0 %
Eosinophils Absolute: 0.1 10*3/uL (ref 0.0–0.5)
Eosinophils Relative: 2 %
HCT: 43.1 % (ref 36.0–46.0)
Hemoglobin: 13 g/dL (ref 12.0–15.0)
Immature Granulocytes: 1 %
Lymphocytes Relative: 20 %
Lymphs Abs: 1.8 10*3/uL (ref 0.7–4.0)
MCH: 26.9 pg (ref 26.0–34.0)
MCHC: 30.2 g/dL (ref 30.0–36.0)
MCV: 89.2 fL (ref 80.0–100.0)
Monocytes Absolute: 0.6 10*3/uL (ref 0.1–1.0)
Monocytes Relative: 7 %
Neutro Abs: 6.2 10*3/uL (ref 1.7–7.7)
Neutrophils Relative %: 70 %
Platelets: 273 10*3/uL (ref 150–400)
RBC: 4.83 MIL/uL (ref 3.87–5.11)
RDW: 16.1 % — ABNORMAL HIGH (ref 11.5–15.5)
WBC: 8.9 10*3/uL (ref 4.0–10.5)
nRBC: 0 % (ref 0.0–0.2)

## 2023-07-05 LAB — URINALYSIS, ROUTINE W REFLEX MICROSCOPIC
Bilirubin Urine: NEGATIVE
Glucose, UA: NEGATIVE mg/dL
Hgb urine dipstick: NEGATIVE
Ketones, ur: 5 mg/dL — AB
Nitrite: NEGATIVE
Protein, ur: NEGATIVE mg/dL
Specific Gravity, Urine: 1.018 (ref 1.005–1.030)
WBC, UA: >50 WBC/hpf (ref 0–5)
pH: 6 (ref 5.0–8.0)

## 2023-07-05 LAB — BASIC METABOLIC PANEL
Anion gap: 9 (ref 5–15)
BUN: 13 mg/dL (ref 8–23)
CO2: 29 mmol/L (ref 22–32)
Calcium: 9.5 mg/dL (ref 8.9–10.3)
Chloride: 101 mmol/L (ref 98–111)
Creatinine, Ser: 0.71 mg/dL (ref 0.44–1.00)
GFR, Estimated: >60 mL/min (ref >60)
Glucose, Bld: 107 mg/dL — ABNORMAL HIGH (ref 70–99)
Potassium: 3.6 mmol/L (ref 3.5–5.1)
Sodium: 139 mmol/L (ref 135–145)

## 2023-07-05 MED ORDER — LEVOFLOXACIN 750 MG PO TABS
750.0000 mg | ORAL_TABLET | Freq: Once | ORAL | Status: AC
  Administered 2023-07-05: 750 mg via ORAL
  Filled 2023-07-05: qty 1

## 2023-07-05 MED ORDER — LEVOFLOXACIN 500 MG PO TABS: 500.0000 mg | ORAL_TABLET | Freq: Every day | ORAL | 0 refills | Status: AC

## 2023-07-05 NOTE — ED Provider Notes (Signed)
 [place] [facility] AT [facility] Provider Note   CSN: [id number] Arrival date & time: 07/05/23  1925     History  Chief Complaint  Patient presents with   Fall    [name] is a 83 y.o. female.   Fall   This patient is an elderly 83 year old female, she comes from [facility] nursing facility, the staff at the facility found the patient on the ground beside her bed, evidently the bed had fallen over and had been laying on her right forearm which caused a hematoma, nobody saw this happen, evidently the patient had been refusing to take her medications over the last 24 hours because of some hallucinations.  There has been no vomiting or fevers or coughing or shortness of breath reported.  The patient does take Xarelto  as an anticoagulant, and according to the medical record has a history of hyperlipidemia, COPD, falling occasionally, history of dizziness and fibromyalgia and tremor and hypertension and paroxysmal atrial fibrillation as well as diabetes.      Home Medications Prior to Admission medications   Medication Sig Start Date End Date Taking? Authorizing Provider  levofloxacin  (LEVAQUIN ) 500 MG tablet Take 1 tablet (500 mg total) by mouth daily for 7 days. 07/05/23 07/12/23 Yes Cleotilde Rogue, MD  acetaminophen  (TYLENOL ) 500 MG tablet Take 500 mg by mouth daily as needed for headache.    [provider]  albuterol  (PROVENTIL ) (2.5 MG/3ML) 0.083% nebulizer solution Take 3 mLs (2.5 mg total) by nebulization every 6 (six) hours as needed for wheezing or shortness of breath. 06/05/16   Alphonsa Elsie RAMAN, MD  albuterol  (VENTOLIN  HFA) 108 (90 Base) MCG/ACT inhaler INHALE 2 PUFFS INTO THE LUNGS 4 TIMES DAILY AS NEEDED FOR WHEEZING. 03/04/18   Alphonsa Elsie RAMAN, MD  azithromycin  (ZITHROMAX ) 250 MG tablet Take 1 tablet (250 mg total) by mouth daily. Take first 2 tablets together, then 1 every day until finished. 01/05/23   Silver Wonda LABOR, PA   Cholecalciferol (VITAMIN D3) 1000 units CAPS Take 1 capsule every evening by mouth.     [provider]  conjugated estrogens (PREMARIN) vaginal cream Place 1 Applicatorful vaginally 2 (two) times a week. On Mondays and Fridays    [provider]  diltiazem  (CARDIZEM  CD) 120 MG 24 hr capsule Take 1 capsule (120 mg total) by mouth daily. 05/21/19   Evonnie Lenis, MD  diphenoxylate -atropine  (LOMOTIL ) 2.5-0.025 MG tablet TAKE ONE TABLET BY MOUTH 2 TIMES A DAY AS NEEDED. 08/10/18   Setzer, Veva CROME, NP  escitalopram  (LEXAPRO ) 20 MG tablet TAKE ONE TABLET BY MOUTH ONCE DAILY. 03/04/18   Alphonsa Elsie RAMAN, MD  FLOVENT  DISKUS 100 MCG/BLIST AEPB Inhale 2 puffs into the lungs in the morning and at bedtime. 01/06/20   [provider]  furosemide  (LASIX ) 40 MG tablet TAKE ONE TABLET BY MOUTH DAILY. 08/18/18   Charls Pearla LABOR, MD  hydrocortisone  cream 1 % Apply 1 application topically 2 (two) times daily.    [provider]  levothyroxine  (SYNTHROID ) 50 MCG tablet Take 50 mcg by mouth daily before breakfast.    [provider]  lidocaine  (XYLOCAINE ) 2 % solution Use as directed 10 mLs in the mouth or throat 4 (four) times daily -  with meals and at bedtime. 05/20/19   Evonnie Lenis, MD  magnesium  oxide (MAG-OX) 400 MG tablet Take 400 mg by mouth daily.    [provider]  meclizine  (ANTIVERT ) 25 MG tablet Take 1 tablet (25  mg total) by mouth 3 (three) times daily as needed for dizziness. 04/14/18   Alphonsa Elsie RAMAN, MD  metoCLOPramide  (REGLAN ) 5 MG tablet Take 1 tablet (5 mg total) by mouth 3 (three) times daily before meals. 05/21/19   Evonnie Lenis, MD  nitroGLYCERIN  (NITROLINGUAL ) 0.4 MG/SPRAY spray Place 1 spray under the tongue every 5 (five) minutes x 3 doses as needed for chest pain. 06/04/16   Charls Pearla LABOR, MD  pantoprazole  (PROTONIX ) 40 MG tablet Take 1 tablet (40 mg total) by mouth 2 (two) times daily before a meal. X 1 month, then once daily  forever 05/21/19   Evonnie Lenis, MD  Potassium Chloride  ER 20 MEQ TBCR One po daily 08/15/17   Alphonsa Elsie RAMAN, MD  rivaroxaban  (XARELTO ) 20 MG TABS tablet Take 20 mg by mouth daily with supper.    [provider]  rosuvastatin  (CRESTOR ) 20 MG tablet Take 20 mg by mouth daily.    [provider]  simethicone  (MYLICON) 125 MG chewable tablet Chew 125 mg by mouth 3 (three) times daily with meals.    [provider]  triamcinolone  cream (KENALOG ) 0.1 % Apply 1 application topically 2 (two) times daily. 08/18/18   Alphonsa Elsie RAMAN, MD      Allergies    Cefprozil , Demerol , and Meperidine  hcl    Review of Systems   Review of Systems  All other systems reviewed and are negative.   Physical Exam Updated Vital Signs BP (!) 146/104   Pulse 96   Temp 97.6 F (36.4 C) (Oral)   Resp 16   Ht 1.676 m (5' 6)   Wt 117.9 kg   SpO2 99%   BMI 41.97 kg/m  Physical Exam Vitals and nursing note reviewed.  Constitutional:      General: She is not in acute distress.    Appearance: She is well-developed.  HENT:     Head: Normocephalic and atraumatic.     Comments: I have palpated the patient's entire head and scalp and face and neck, there is no tenderness no hematomas no abrasions or other signs of trauma to the head or neck    Mouth/Throat:     Pharynx: No oropharyngeal exudate.  Eyes:     General: No scleral icterus.       Right eye: No discharge.        Left eye: No discharge.     Conjunctiva/sclera: Conjunctivae normal.     Pupils: Pupils are equal, round, and reactive to light.  Neck:     Thyroid : No thyromegaly.     Vascular: No JVD.  Cardiovascular:     Rate and Rhythm: Normal rate and regular rhythm.     Heart sounds: Normal heart sounds. No murmur heard.    No friction rub. No gallop.  Pulmonary:     Effort: Pulmonary effort is normal. No respiratory distress.     Breath sounds: Normal breath sounds. No wheezing or rales.  Abdominal:     General:  Bowel sounds are normal. There is no distension.     Palpations: Abdomen is soft. There is no mass.     Tenderness: There is no abdominal tenderness.  Musculoskeletal:        General: Tenderness present. Normal range of motion.     Cervical back: Normal range of motion and neck supple.     Comments: The patient has tenderness with manipulation of either of her legs stating that her knees hurt.  There is no visible  trauma to the knees, she has severe chronic arthritis, she has what appears to be a hematoma to the right forearm but the joints of her shoulders elbows wrist and hands are normal.  Lymphadenopathy:     Cervical: No cervical adenopathy.  Skin:    General: Skin is warm and dry.     Findings: No erythema or rash.     Comments: Hematoma to the dorsal aspect of the right forearm, mid forearm  Neurological:     Mental Status: She is alert.     Coordination: Coordination normal.     Comments: She is able to count my fingers, peripheral visual fields appear normal, cranial nerves III through XII appear normal, she is able to lift both arms, she does not lift either leg citing pain in her knees  Psychiatric:        Behavior: Behavior normal.     Comments: No hallucinations at this time     ED Results / Procedures / Treatments   Labs (all labs ordered are listed, but only abnormal results are displayed) Labs Reviewed  CBC WITH DIFFERENTIAL/PLATELET - Abnormal; Notable for the following components:      Result Value   RDW 16.1 (*)    Abs Immature Granulocytes 0.08 (*)    All other components within normal limits  BASIC METABOLIC PANEL - Abnormal; Notable for the following components:   Glucose, Bld 107 (*)    All other components within normal limits  URINALYSIS, ROUTINE W REFLEX MICROSCOPIC - Abnormal; Notable for the following components:   APPearance CLOUDY (*)    Ketones, ur 5 (*)    Leukocytes,Ua LARGE (*)    Bacteria, UA MANY (*)    Crystals PRESENT (*)    All other  components within normal limits  URINE CULTURE    EKG EKG Interpretation Date/Time:  Saturday July 05 2023 20:38:09 EST Ventricular Rate:  112 PR Interval:  118 QRS Duration:  140 QT Interval:  307 QTC Calculation: 419 R Axis:   0  Text Interpretation: Sinus tachycardia with irregular rate Nonspecific intraventricular conduction delay Borderline abnrm T, anterolateral leads Artifact in lead(s) I II aVR aVL Confirmed by Cleotilde Rogue ([id number]) on 07/05/2023 8:42:01 PM  Radiology DG Pelvis 1-2 Views Result Date: 07/05/2023 CLINICAL DATA:  Status post fall. EXAM: PELVIS - 1-2 VIEW COMPARISON:  None Available. FINDINGS: There is no evidence of pelvic fracture or diastasis. No pelvic bone lesions are seen. Mild degenerative changes are seen involving both hips in the form of joint space narrowing and acetabular sclerosis. IMPRESSION: Mild degenerative changes involving both hips. Electronically Signed   By: Suzen Dials M.D.   On: 07/05/2023 20:35   DG Knee Complete 4 Views Left Result Date: 07/05/2023 CLINICAL DATA:  Witnessed fall. EXAM: LEFT KNEE - COMPLETE 4+ VIEW COMPARISON:  None Available. FINDINGS: No evidence of an acute fracture, dislocation, or joint effusion. Medial marginal osteophytes are noted. There is moderate to marked severity tricompartmental joint space narrowing. Soft tissues are unremarkable. 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 Electronically Signed   By: Suzen Dials M.D.   On: 07/05/2023 20:33   DG Knee Complete 4 Views Right Result Date: 07/05/2023 CLINICAL DATA:  Status post fall. EXAM: RIGHT KNEE - COMPLETE 4+ VIEW COMPARISON:  None Available. FINDINGS: No evidence of an acute fracture, dislocation, or joint effusion. A small, chronic appearing cortical deformity versus medial marginal osteophyte is seen along the proximal right tibia. Moderate to marked severity tricompartmental  joint space narrowing is seen. Soft  tissues are unremarkable. 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 Electronically Signed   By: Suzen Dials M.D.   On: 07/05/2023 20:31   DG Forearm Right Result Date: 07/05/2023 CLINICAL DATA:  Status post fall. EXAM: RIGHT FOREARM - 2 VIEW COMPARISON:  None Available. FINDINGS: There is no evidence of fracture or other focal bone lesions. Moderate to marked severity focal soft tissue swelling is seen along the dorsal aspect of the mid right forearm. IMPRESSION: Moderate to marked severity focal soft tissue swelling along the dorsal aspect of the mid right forearm. Electronically Signed   By: Suzen Dials M.D.   On: 07/05/2023 20:26    Procedures Procedures    Medications Ordered in ED Medications  levofloxacin  (LEVAQUIN ) tablet 750 mg (750 mg Oral Given 07/05/23 2238)    ED Course/ Medical Decision Making/ A&P                                 Medical Decision Making Amount and/or Complexity of Data Reviewed Labs: ordered. Radiology: ordered. ECG/medicine tests: ordered.  Risk Prescription drug management.   Because of the fall is unknown, she might of rolled out of bed, I do not suspect that she is very ambulatory given her severe knee arthritis.  That being said we will obtain x-rays of the knees and the right forearm, there is no other signs of trauma, I will check an EKG and some labs to make sure she is not having some underlying infection that might be causing elucidation's.  She is not hallucinating at this time and appears very hemodynamically stable comfortable and in no distress  Imaging: I personally viewed and interpret the x-ray of the pelvis and bilateral hips, the bilateral knees and the forearm, there is no signs of broken bones that there is severe tricompartmental degenerative changes of both knees.  Labs: CBC and metabolic panel unremarkable, there is no anemia, no leukocytosis, no renal dysfunction and no electrolyte  abnormalities.  EKG: Cardiac monitoring: Normal sinus rhythm with mild tachycardia, could be A-fib, the patient is already anticoagulated for the same.  No signs of acute ischemia or arrhythmia.  See interpretation above  Urinalysis: Ozolin for urinary tract infection, Levaquin  started  Medication management: Levaquin  given in the ED, stable for discharge on medications, no fever no tachycardia no hypotension.        Final Clinical Impression(s) / ED Diagnoses Final diagnoses:  Acute cystitis without hematuria  Contusion of right forearm, initial encounter    Rx / DC Orders ED Discharge Orders          Ordered    levofloxacin  (LEVAQUIN ) 500 MG tablet  Daily        07/05/23 2252              Cleotilde Rogue, MD 07/05/23 2254

## 2023-07-05 NOTE — ED Triage Notes (Signed)
 PT BIB [facility] from [facility] due to witnessed fall but facility was unable to tell EMS is pt hit head or had LOC. When pt was found after fall, the facility bed was on top of her arm. No obvious deformities. Pt also has not taken meds for 24 hours due to believing her medications are rat poison

## 2023-07-05 NOTE — Discharge Instructions (Addendum)
 Thankfully your testing has been very reassuring, the only abnormal finding was that the urinalysis showed that you do have a urinary infection.  Your x-rays do not show any broken bones, please keep a cold compress on your arm where the bruising is as this will help with the swelling to not get too high.  Take the antibiotic once a day for the next 7 days, ER for worsening symptoms otherwise see your family doctor within 3 days for recheck

## 2023-07-06 DIAGNOSIS — S5011XA Contusion of right forearm, initial encounter: Secondary | ICD-10-CM | POA: Diagnosis not present

## 2023-07-06 MED ORDER — DILTIAZEM HCL ER COATED BEADS 120 MG PO CP24
120.0000 mg | ORAL_CAPSULE | Freq: Once | ORAL | Status: AC
  Administered 2023-07-06: 120 mg via ORAL
  Filled 2023-07-06: qty 1

## 2023-07-06 MED ORDER — DILTIAZEM HCL 30 MG PO TABS
120.0000 mg | ORAL_TABLET | Freq: Once | ORAL | Status: AC
  Administered 2023-07-06: 120 mg via ORAL
  Filled 2023-07-06: qty 4

## 2023-07-06 NOTE — ED Notes (Signed)
 EDP made aware of pts HR, orders received

## 2023-07-06 NOTE — ED Provider Notes (Signed)
 Patient awaiting transport back to SNF, history of Afib, has had variable HR sometimes up to 130s. Will give a dose of oral cardizem while still in ED.    Pollyann Savoy, MD 07/06/23 [phone]

## 2023-07-08 LAB — URINE CULTURE: Culture: >=100000 — AB

## 2023-07-09 ENCOUNTER — Telehealth (HOSPITAL_BASED_OUTPATIENT_CLINIC_OR_DEPARTMENT_OTHER): Payer: Self-pay | Admitting: *Deleted

## 2023-07-09 NOTE — Telephone Encounter (Signed)
 Post ED Visit - Positive Culture Follow-up  Culture report reviewed by antimicrobial stewardship pharmacist: [facility] [x]  [place], [place].D. []  Skeet Duke, Pharm.D., BCPS AQ-ID []  Leslee Rase, Pharm.D., BCPS []  Garland Junk, Pharm.D., BCPS []  [place], [address].D., BCPS, AAHIVP []  Alcide Aly, Pharm.D., BCPS, AAHIVP []  Jerri Morale, PharmD, BCPS []  Graham Laws, PharmD, BCPS []  Cleda Curly, PharmD, BCPS []  Tamar Fairly, PharmD []  Ballard Levels, PharmD, BCPS []  Ollen Beverage, PharmD  [facility] []  Arlyne Bering, PharmD []  Sherryle Don, PharmD []  Van Gelinas, PharmD []  Delila Felty, Rph []  [place]) Cleora Daft, PharmD []  Augustina Block, PharmD []  Arie Kurtz, PharmD []  Sharlyn Deaner, PharmD []  Agnes Hose, PharmD []  Kendall Pauls, PharmD []  Gladstone Lamer, PharmD []  Armanda Bern, PharmD []  Tera Fellows, PharmD   Positive urine culture Treated with Levofloxacin , organism sensitive to the same and no further patient follow-up is required at this time.  Lovell Rubenstein Sharion Davidson 07/09/2023, 10:27 AM

## 2023-07-24 ENCOUNTER — Other Ambulatory Visit: Payer: Self-pay

## 2023-07-24 ENCOUNTER — Inpatient Hospital Stay (HOSPITAL_COMMUNITY)
Admission: EM | Admit: 2023-07-24 | Discharge: 2023-08-08 | DRG: 193 | Disposition: A | Discharge status: Skilled Nursing Facility | Payer: Medicare (Managed Care) | Source: Skilled Nursing Facility | Attending: Family Medicine | Admitting: Family Medicine

## 2023-07-24 ENCOUNTER — Emergency Department (HOSPITAL_COMMUNITY): Payer: Medicare (Managed Care)

## 2023-07-24 ENCOUNTER — Encounter (HOSPITAL_COMMUNITY): Payer: Self-pay

## 2023-07-24 DIAGNOSIS — Z66 Do not resuscitate: Secondary | ICD-10-CM | POA: Diagnosis present

## 2023-07-24 DIAGNOSIS — I4819 Other persistent atrial fibrillation: Secondary | ICD-10-CM | POA: Diagnosis not present

## 2023-07-24 DIAGNOSIS — E11649 Type 2 diabetes mellitus with hypoglycemia without coma: Secondary | ICD-10-CM | POA: Diagnosis present

## 2023-07-24 DIAGNOSIS — N182 Chronic kidney disease, stage 2 (mild): Secondary | ICD-10-CM | POA: Diagnosis present

## 2023-07-24 DIAGNOSIS — R627 Adult failure to thrive: Secondary | ICD-10-CM | POA: Diagnosis present

## 2023-07-24 DIAGNOSIS — Z7401 Bed confinement status: Secondary | ICD-10-CM

## 2023-07-24 DIAGNOSIS — W19XXXA Unspecified fall, initial encounter: Secondary | ICD-10-CM | POA: Diagnosis present

## 2023-07-24 DIAGNOSIS — E118 Type 2 diabetes mellitus with unspecified complications: Secondary | ICD-10-CM | POA: Diagnosis not present

## 2023-07-24 DIAGNOSIS — J470 Bronchiectasis with acute lower respiratory infection: Secondary | ICD-10-CM | POA: Diagnosis not present

## 2023-07-24 DIAGNOSIS — N390 Urinary tract infection, site not specified: Secondary | ICD-10-CM | POA: Diagnosis present

## 2023-07-24 DIAGNOSIS — Z955 Presence of coronary angioplasty implant and graft: Secondary | ICD-10-CM

## 2023-07-24 DIAGNOSIS — G928 Other toxic encephalopathy: Secondary | ICD-10-CM | POA: Diagnosis present

## 2023-07-24 DIAGNOSIS — F03C0 Unspecified dementia, severe, without behavioral disturbance, psychotic disturbance, mood disturbance, and anxiety: Secondary | ICD-10-CM | POA: Diagnosis present

## 2023-07-24 DIAGNOSIS — Z8249 Family history of ischemic heart disease and other diseases of the circulatory system: Secondary | ICD-10-CM

## 2023-07-24 DIAGNOSIS — Z8349 Family history of other endocrine, nutritional and metabolic diseases: Secondary | ICD-10-CM

## 2023-07-24 DIAGNOSIS — J9811 Atelectasis: Secondary | ICD-10-CM | POA: Diagnosis not present

## 2023-07-24 DIAGNOSIS — E662 Morbid (severe) obesity with alveolar hypoventilation: Secondary | ICD-10-CM | POA: Diagnosis present

## 2023-07-24 DIAGNOSIS — R1310 Dysphagia, unspecified: Secondary | ICD-10-CM | POA: Diagnosis present

## 2023-07-24 DIAGNOSIS — K219 Gastro-esophageal reflux disease without esophagitis: Secondary | ICD-10-CM | POA: Diagnosis present

## 2023-07-24 DIAGNOSIS — M1990 Unspecified osteoarthritis, unspecified site: Secondary | ICD-10-CM | POA: Diagnosis present

## 2023-07-24 DIAGNOSIS — Z888 Allergy status to other drugs, medicaments and biological substances status: Secondary | ICD-10-CM

## 2023-07-24 DIAGNOSIS — I5032 Chronic diastolic (congestive) heart failure: Secondary | ICD-10-CM | POA: Diagnosis present

## 2023-07-24 DIAGNOSIS — J101 Influenza due to other identified influenza virus with other respiratory manifestations: Secondary | ICD-10-CM | POA: Diagnosis not present

## 2023-07-24 DIAGNOSIS — J1000 Influenza due to other identified influenza virus with unspecified type of pneumonia: Secondary | ICD-10-CM | POA: Diagnosis present

## 2023-07-24 DIAGNOSIS — Z515 Encounter for palliative care: Secondary | ICD-10-CM

## 2023-07-24 DIAGNOSIS — Z7189 Other specified counseling: Secondary | ICD-10-CM | POA: Diagnosis not present

## 2023-07-24 DIAGNOSIS — Z885 Allergy status to narcotic agent status: Secondary | ICD-10-CM

## 2023-07-24 DIAGNOSIS — I4821 Permanent atrial fibrillation: Secondary | ICD-10-CM | POA: Diagnosis present

## 2023-07-24 DIAGNOSIS — G9340 Encephalopathy, unspecified: Secondary | ICD-10-CM

## 2023-07-24 DIAGNOSIS — E039 Hypothyroidism, unspecified: Secondary | ICD-10-CM | POA: Diagnosis present

## 2023-07-24 DIAGNOSIS — Z7951 Long term (current) use of inhaled steroids: Secondary | ICD-10-CM

## 2023-07-24 DIAGNOSIS — J45909 Unspecified asthma, uncomplicated: Secondary | ICD-10-CM | POA: Diagnosis present

## 2023-07-24 DIAGNOSIS — I252 Old myocardial infarction: Secondary | ICD-10-CM

## 2023-07-24 DIAGNOSIS — R4182 Altered mental status, unspecified: Principal | ICD-10-CM

## 2023-07-24 DIAGNOSIS — I4891 Unspecified atrial fibrillation: Secondary | ICD-10-CM | POA: Diagnosis not present

## 2023-07-24 DIAGNOSIS — Z807 Family history of other malignant neoplasms of lymphoid, hematopoietic and related tissues: Secondary | ICD-10-CM

## 2023-07-24 DIAGNOSIS — R935 Abnormal findings on diagnostic imaging of other abdominal regions, including retroperitoneum: Secondary | ICD-10-CM

## 2023-07-24 DIAGNOSIS — Z90710 Acquired absence of both cervix and uterus: Secondary | ICD-10-CM

## 2023-07-24 DIAGNOSIS — D72819 Decreased white blood cell count, unspecified: Secondary | ICD-10-CM | POA: Diagnosis present

## 2023-07-24 DIAGNOSIS — I480 Paroxysmal atrial fibrillation: Secondary | ICD-10-CM | POA: Diagnosis present

## 2023-07-24 DIAGNOSIS — Z79890 Hormone replacement therapy (postmenopausal): Secondary | ICD-10-CM

## 2023-07-24 DIAGNOSIS — E1122 Type 2 diabetes mellitus with diabetic chronic kidney disease: Secondary | ICD-10-CM | POA: Diagnosis present

## 2023-07-24 DIAGNOSIS — E119 Type 2 diabetes mellitus without complications: Secondary | ICD-10-CM

## 2023-07-24 DIAGNOSIS — D631 Anemia in chronic kidney disease: Secondary | ICD-10-CM | POA: Diagnosis present

## 2023-07-24 DIAGNOSIS — I878 Other specified disorders of veins: Secondary | ICD-10-CM | POA: Diagnosis present

## 2023-07-24 DIAGNOSIS — Z833 Family history of diabetes mellitus: Secondary | ICD-10-CM

## 2023-07-24 DIAGNOSIS — H9190 Unspecified hearing loss, unspecified ear: Secondary | ICD-10-CM | POA: Diagnosis present

## 2023-07-24 DIAGNOSIS — J690 Pneumonitis due to inhalation of food and vomit: Secondary | ICD-10-CM | POA: Diagnosis not present

## 2023-07-24 DIAGNOSIS — N179 Acute kidney failure, unspecified: Secondary | ICD-10-CM | POA: Diagnosis not present

## 2023-07-24 DIAGNOSIS — Z79899 Other long term (current) drug therapy: Secondary | ICD-10-CM

## 2023-07-24 DIAGNOSIS — F17210 Nicotine dependence, cigarettes, uncomplicated: Secondary | ICD-10-CM | POA: Diagnosis present

## 2023-07-24 DIAGNOSIS — Z87442 Personal history of urinary calculi: Secondary | ICD-10-CM

## 2023-07-24 DIAGNOSIS — Z801 Family history of malignant neoplasm of trachea, bronchus and lung: Secondary | ICD-10-CM

## 2023-07-24 DIAGNOSIS — I130 Hypertensive heart and chronic kidney disease with heart failure and stage 1 through stage 4 chronic kidney disease, or unspecified chronic kidney disease: Secondary | ICD-10-CM | POA: Diagnosis present

## 2023-07-24 DIAGNOSIS — Y92129 Unspecified place in nursing home as the place of occurrence of the external cause: Secondary | ICD-10-CM

## 2023-07-24 DIAGNOSIS — Z87730 Personal history of (corrected) cleft lip and palate: Secondary | ICD-10-CM

## 2023-07-24 DIAGNOSIS — E785 Hyperlipidemia, unspecified: Secondary | ICD-10-CM | POA: Diagnosis present

## 2023-07-24 DIAGNOSIS — E876 Hypokalemia: Secondary | ICD-10-CM | POA: Diagnosis not present

## 2023-07-24 DIAGNOSIS — I2510 Atherosclerotic heart disease of native coronary artery without angina pectoris: Secondary | ICD-10-CM | POA: Diagnosis present

## 2023-07-24 DIAGNOSIS — Z83719 Family history of colon polyps, unspecified: Secondary | ICD-10-CM

## 2023-07-24 DIAGNOSIS — Z6841 Body Mass Index (BMI) 40.0 and over, adult: Secondary | ICD-10-CM | POA: Diagnosis not present

## 2023-07-24 DIAGNOSIS — Z86711 Personal history of pulmonary embolism: Secondary | ICD-10-CM

## 2023-07-24 DIAGNOSIS — Z7901 Long term (current) use of anticoagulants: Secondary | ICD-10-CM

## 2023-07-24 DIAGNOSIS — S5011XA Contusion of right forearm, initial encounter: Secondary | ICD-10-CM | POA: Diagnosis present

## 2023-07-24 DIAGNOSIS — E669 Obesity, unspecified: Secondary | ICD-10-CM | POA: Diagnosis present

## 2023-07-24 LAB — COMPREHENSIVE METABOLIC PANEL
ALT: 10 U/L (ref 0–44)
AST: 23 U/L (ref 15–41)
Albumin: 2.8 g/dL — ABNORMAL LOW (ref 3.5–5.0)
Alkaline Phosphatase: 84 U/L (ref 38–126)
Anion gap: 9 (ref 5–15)
BUN: 11 mg/dL (ref 8–23)
CO2: 27 mmol/L (ref 22–32)
Calcium: 8.6 mg/dL — ABNORMAL LOW (ref 8.9–10.3)
Chloride: 105 mmol/L (ref 98–111)
Creatinine, Ser: 0.67 mg/dL (ref 0.44–1.00)
GFR, Estimated: >60 mL/min (ref >60)
Glucose, Bld: 87 mg/dL (ref 70–99)
Potassium: 3.7 mmol/L (ref 3.5–5.1)
Sodium: 141 mmol/L (ref 135–145)
Total Bilirubin: 0.6 mg/dL (ref 0.0–1.2)
Total Protein: 6.8 g/dL (ref 6.5–8.1)

## 2023-07-24 LAB — CBC WITH DIFFERENTIAL/PLATELET
Abs Immature Granulocytes: 0.01 10*3/uL (ref 0.00–0.07)
Basophils Absolute: 0 10*3/uL (ref 0.0–0.1)
Basophils Relative: 0 %
Eosinophils Absolute: 0 10*3/uL (ref 0.0–0.5)
Eosinophils Relative: 1 %
HCT: 42.5 % (ref 36.0–46.0)
Hemoglobin: 12.7 g/dL (ref 12.0–15.0)
Immature Granulocytes: 0 %
Lymphocytes Relative: 14 %
Lymphs Abs: 0.5 10*3/uL — ABNORMAL LOW (ref 0.7–4.0)
MCH: 27.1 pg (ref 26.0–34.0)
MCHC: 29.9 g/dL — ABNORMAL LOW (ref 30.0–36.0)
MCV: 90.6 fL (ref 80.0–100.0)
Monocytes Absolute: 0.5 10*3/uL (ref 0.1–1.0)
Monocytes Relative: 13 %
Neutro Abs: 2.7 10*3/uL (ref 1.7–7.7)
Neutrophils Relative %: 72 %
Platelets: 225 10*3/uL (ref 150–400)
RBC: 4.69 MIL/uL (ref 3.87–5.11)
RDW: 17.4 % — ABNORMAL HIGH (ref 11.5–15.5)
WBC: 3.8 10*3/uL — ABNORMAL LOW (ref 4.0–10.5)
nRBC: 0 % (ref 0.0–0.2)

## 2023-07-24 LAB — RESP PANEL BY RT-PCR (RSV, FLU A&B, COVID)  RVPGX2
Influenza A by PCR: POSITIVE — AB
Influenza B by PCR: NEGATIVE
Resp Syncytial Virus by PCR: NEGATIVE
SARS Coronavirus 2 by RT PCR: NEGATIVE

## 2023-07-24 LAB — LACTIC ACID, PLASMA
Lactic Acid, Venous: 1.3 mmol/L (ref 0.5–1.9)
Lactic Acid, Venous: 1.4 mmol/L (ref 0.5–1.9)

## 2023-07-24 LAB — APTT: aPTT: 42 s — ABNORMAL HIGH (ref 24–36)

## 2023-07-24 LAB — PROTIME-INR
INR: 1.3 — ABNORMAL HIGH (ref 0.8–1.2)
Prothrombin Time: 16.3 s — ABNORMAL HIGH (ref 11.4–15.2)

## 2023-07-24 MED ORDER — PANTOPRAZOLE SODIUM 40 MG PO TBEC
40.0000 mg | DELAYED_RELEASE_TABLET | Freq: Every day | ORAL | Status: DC
  Administered 2023-07-25 – 2023-07-30 (×5): 40 mg via ORAL
  Filled 2023-07-24 (×8): qty 1

## 2023-07-24 MED ORDER — MAGNESIUM CITRATE PO SOLN: 1.0000 | Freq: Once | ORAL | Status: DC | PRN

## 2023-07-24 MED ORDER — IOHEXOL 300 MG/ML  SOLN
100.0000 mL | Freq: Once | INTRAMUSCULAR | Status: AC | PRN
  Administered 2023-07-24: 100 mL via INTRAVENOUS

## 2023-07-24 MED ORDER — ACETAMINOPHEN 650 MG RE SUPP: 650.0000 mg | Freq: Four times a day (QID) | RECTAL | Status: DC | PRN

## 2023-07-24 MED ORDER — LEVOTHYROXINE SODIUM 50 MCG PO TABS
50.0000 ug | ORAL_TABLET | Freq: Every day | ORAL | Status: DC
  Administered 2023-07-25 – 2023-08-05 (×10): 50 ug via ORAL
  Filled 2023-07-24 (×3): qty 1
  Filled 2023-07-24: qty 2
  Filled 2023-07-24: qty 1
  Filled 2023-07-24: qty 2
  Filled 2023-07-24 (×5): qty 1

## 2023-07-24 MED ORDER — ACETAMINOPHEN 325 MG PO TABS
650.0000 mg | ORAL_TABLET | Freq: Four times a day (QID) | ORAL | Status: DC | PRN
  Administered 2023-08-01 – 2023-08-02 (×2): 650 mg via ORAL
  Filled 2023-07-24 (×2): qty 2

## 2023-07-24 MED ORDER — HYDRALAZINE HCL 20 MG/ML IJ SOLN: 5.0000 mg | INTRAMUSCULAR | Status: DC | PRN

## 2023-07-24 MED ORDER — POLYETHYLENE GLYCOL 3350 17 G PO PACK: 17.0000 g | PACK | Freq: Every day | ORAL | Status: DC | PRN

## 2023-07-24 MED ORDER — MONTELUKAST SODIUM 10 MG PO TABS
10.0000 mg | ORAL_TABLET | Freq: Every day | ORAL | Status: DC
Start: 2023-07-25 — End: 2023-08-01
  Administered 2023-07-25 – 2023-07-30 (×5): 10 mg via ORAL
  Filled 2023-07-24 (×6): qty 1

## 2023-07-24 MED ORDER — MAGNESIUM OXIDE -MG SUPPLEMENT 400 (240 MG) MG PO TABS
400.0000 mg | ORAL_TABLET | Freq: Every day | ORAL | Status: DC
  Administered 2023-07-25 – 2023-07-30 (×5): 400 mg via ORAL
  Filled 2023-07-24 (×8): qty 1

## 2023-07-24 MED ORDER — PIPERACILLIN-TAZOBACTAM 3.375 G IVPB: 3.3750 g | Freq: Three times a day (TID) | INTRAVENOUS | Status: DC

## 2023-07-24 MED ORDER — DILTIAZEM HCL-DEXTROSE 125-5 MG/125ML-% IV SOLN (PREMIX)
5.0000 mg/h | INTRAVENOUS | Status: DC
  Administered 2023-07-24: 5 mg/h via INTRAVENOUS
  Filled 2023-07-24: qty 125

## 2023-07-24 MED ORDER — ESCITALOPRAM OXALATE 10 MG PO TABS
20.0000 mg | ORAL_TABLET | Freq: Every day | ORAL | Status: DC
  Administered 2023-07-25 – 2023-07-30 (×5): 20 mg via ORAL
  Filled 2023-07-24 (×7): qty 2

## 2023-07-24 MED ORDER — DILTIAZEM LOAD VIA INFUSION
10.0000 mg | Freq: Once | INTRAVENOUS | Status: AC
  Administered 2023-07-24: 10 mg via INTRAVENOUS
  Filled 2023-07-24: qty 10

## 2023-07-24 MED ORDER — ROSUVASTATIN CALCIUM 20 MG PO TABS
20.0000 mg | ORAL_TABLET | Freq: Every day | ORAL | Status: DC
Start: 2023-07-25 — End: 2023-08-01
  Administered 2023-07-25 – 2023-07-30 (×5): 20 mg via ORAL
  Filled 2023-07-24 (×8): qty 1

## 2023-07-24 MED ORDER — LACTATED RINGERS IV SOLN: INTRAVENOUS | Status: DC

## 2023-07-24 MED ORDER — RIVAROXABAN 20 MG PO TABS: 20.0000 mg | ORAL_TABLET | Freq: Every day | ORAL | Status: DC

## 2023-07-24 MED ORDER — BUPROPION HCL ER (XL) 150 MG PO TB24
150.0000 mg | ORAL_TABLET | Freq: Every day | ORAL | Status: DC
  Administered 2023-07-25 – 2023-07-30 (×5): 150 mg via ORAL
  Filled 2023-07-24 (×8): qty 1

## 2023-07-24 MED ORDER — DILTIAZEM HCL 25 MG/5ML IV SOLN
20.0000 mg | Freq: Once | INTRAVENOUS | Status: AC
  Administered 2023-07-24: 20 mg via INTRAVENOUS
  Filled 2023-07-24: qty 5

## 2023-07-24 MED ORDER — MECLIZINE HCL 12.5 MG PO TABS: 25.0000 mg | ORAL_TABLET | Freq: Three times a day (TID) | ORAL | Status: DC | PRN

## 2023-07-24 MED ORDER — LACTATED RINGERS IV BOLUS
1000.0000 mL | Freq: Once | INTRAVENOUS | Status: AC
  Administered 2023-07-24: 1000 mL via INTRAVENOUS

## 2023-07-24 MED ORDER — OSELTAMIVIR PHOSPHATE 75 MG PO CAPS
75.0000 mg | ORAL_CAPSULE | Freq: Two times a day (BID) | ORAL | Status: AC
  Administered 2023-07-25 – 2023-07-29 (×9): 75 mg via ORAL
  Filled 2023-07-24 (×10): qty 1

## 2023-07-24 NOTE — Progress Notes (Signed)
[facility]  Called by Dr. Estell Harpin regarding this patient.  She presented from a facility with altered mentation.  She has a history significant for HTN, HLD, GERD, hypothyroidism, DM, CAD, and a fib (on either Xarelto or Eliquis-both listed under home medications).  Her surgical history is significant for appendectomy, cholecystectomy, and hysterectomy.  She is tachycardic and tachypenic in the ED.  She is not responding to questions per Dr. Estell Harpin.  Her abdominal exam is benign.  She was recently being treated for a UTI and was noted to the flu positive.  She has a mild leukopenia of 3.8 with a normal lactic acid.  Her CT of the chest, abdomen, and pelvis demonstrated fluid and gas in RLQ most likely within bowel but cannot be confirmed secondary to motion artifact.  Upon my evaluation of the CT, I also cannot determine if gas/fluid is intraluminal given the motion artifact, but I do not see any significant fat stranding in the abdomen to suggest inflammatory process.  Given the patient's benign abdominal exam and unremarkable labs, I believe this is a motion artifact finding on CT.  I will plan to see the patient tomorrow morning and would recommend repeat CT of the abdomen and pelvis if the patient's abdominal exam worsens.  Please hold blood thinning medications until I am able to evaluate the patient.  Please call with any questions or concerns.  Theophilus Kinds, DO [facility] [address] [place], [place] [address] [phone] (office)

## 2023-07-24 NOTE — H&P (Signed)
TRH H&P   Patient Demographics:    Dhalia Space, is a 83 y.o. female  MRN: [id number]   DOB - 1940/10/25  Admit Date - 07/24/2023  Outpatient Primary MD for the patient is Gerda Diss, Vilinda Blanks, MD (Inactive)  Referring MD/NP/PA: Dr. Estell Harpin  Patient coming from: [facility]  Chief Complaint  Patient presents with   Altered Mental Status      HPI:    Daine Farha  is a 83 y.o. female, with a history of paroxysmal atrial fibrillation, diabetes mellitus type 2, hypertension, dysphagia, pulmonary embolus, hypothyroidism, and esophageal dysmotility, dementia, living at SNF for last 5 years, patient with advanced dementia at baseline, bedbound as discussed with family. -Patient was brought by ED secondary to altered mental status, increased lethargy, patient was noted to be altered since yesterday, apparently she is on antibiotics for UTI, as discussed with family, she is with advanced dementia, but usually communicative, remember some family members, bedbound at baseline -In ED she was altered, CT head with no acute findings, significant for chronic infarcts, CT abdomen pelvics with questionable right lower quadrant gas-fluid collection, likely within the bowels, but due to emotional artifact could not rule out abscess or perforated fluid collection, her respiratory panel significant for positive influenza A, [facility] consulted to admit   Review of systems:    Not able to obtain appropriate review of system given her advanced dementia  With Past History of the following :    Past Medical History:  Diagnosis Date   Allergy    Arthritis    Asthma    CAD (coronary artery disease)    BMS and DES to LAD 2004; DES to D2 2005   Chronic pain of left knee    Depression    Diverticulitis of intestine    Essential hypertension    FH: colonic polyps    Adenomataous   GERD  (gastroesophageal reflux disease)    History of kidney stones    Hyperlipidemia    Hypothyroidism    Insomnia    Irritable bowel syndrome    NSTEMI (non-ST elevated myocardial infarction) (HCC)    2005   Paroxysmal atrial fibrillation (HCC)    Pulmonary embolism (HCC)    2000 and 2010   Sleep apnea    Type 2 diabetes mellitus (HCC)    Venous stasis       Past Surgical History:  Procedure Laterality Date   ABDOMINAL HYSTERECTOMY     APPENDECTOMY     BALLOON DILATION N/A 11/11/2013   Procedure: BALLOON DILATION;  Surgeon: Malissa Hippo, MD;  [facility];  Service: Endoscopy;  Laterality: N/A;   BREAST REDUCTION SURGERY     BREAST SURGERY     CHOLECYSTECTOMY     CLEFT PALATE REPAIR     4 surgeries   COLONOSCOPY  2011   negative   COLONOSCOPY N/A  05/10/2014   Procedure: COLONOSCOPY;  Surgeon: Malissa Hippo, MD;  [facility];  Service: Endoscopy;  Laterality: N/A;   ESOPHAGEAL DILATION N/A 03/08/2016   Procedure: ESOPHAGEAL DILATION;  Surgeon: Malissa Hippo, MD;  [facility];  Service: Endoscopy;  Laterality: N/A;   ESOPHAGEAL DILATION N/A 05/16/2019   Procedure: ESOPHAGEAL DILATION;  Surgeon: West Bali, MD;  [facility];  Service: Endoscopy;  Laterality: N/A;   ESOPHAGOGASTRODUODENOSCOPY N/A 11/11/2013   Procedure: ESOPHAGOGASTRODUODENOSCOPY (EGD);  Surgeon: Malissa Hippo, MD;  [facility];  Service: Endoscopy;  Laterality: N/A;  200-moved to 100 Ann notified pt   ESOPHAGOGASTRODUODENOSCOPY N/A 05/09/2014   Procedure: ESOPHAGOGASTRODUODENOSCOPY (EGD);  Surgeon: Malissa Hippo, MD;  [facility];  Service: Endoscopy;  Laterality: N/A;   ESOPHAGOGASTRODUODENOSCOPY N/A 03/08/2016   Procedure: ESOPHAGOGASTRODUODENOSCOPY (EGD);  Surgeon: Malissa Hippo, MD;  [facility];  Service: Endoscopy;  Laterality: N/A;  2:20   ESOPHAGOGASTRODUODENOSCOPY N/A 05/16/2019   Procedure:  ESOPHAGOGASTRODUODENOSCOPY (EGD);  Surgeon: West Bali, MD;  [facility];  Service: Endoscopy;  Laterality: N/A;   HEEL SPUR SURGERY     KNEE SURGERY     MALONEY DILATION N/A 11/11/2013   Procedure: MALONEY DILATION;  Surgeon: Malissa Hippo, MD;  [facility];  Service: Endoscopy;  Laterality: N/A;   OOPHORECTOMY     SAVORY DILATION N/A 11/11/2013   Procedure: SAVORY DILATION;  Surgeon: Malissa Hippo, MD;  [facility];  Service: Endoscopy;  Laterality: N/A;   TONSILLECTOMY        Social History:     Social History   Tobacco Use   Smoking status: Former    Current packs/day: 2.00    Average packs/day: 2.0 packs/day for 36.2 years (72.4 ttl pk-yrs)    Types: Cigarettes    Start date: 05/05/1987   Smokeless tobacco: Never  Substance Use Topics   Alcohol use: No    Alcohol/week: 0.0 standard drinks of alcohol        Family History :     Family History  Problem Relation Age of Onset   Heart attack Mother    Diabetes Mother    Hyperlipidemia Mother    Heart attack Father    Lung cancer Sister    Lymphoma Brother    Colon cancer Neg Hx       Home Medications:   Prior to Admission medications   Medication Sig Start Date End Date Taking? Authorizing Provider  buPROPion (WELLBUTRIN XL) 150 MG 24 hr tablet Take 150 mg by mouth daily. 07/07/23  Yes [provider]  Cholecalciferol (VITAMIN D3) 1000 units CAPS Take 1 capsule every evening by mouth.    Yes [provider]  conjugated estrogens (PREMARIN) vaginal cream Place 1 Applicatorful vaginally 2 (two) times a week. On Mondays and Fridays   Yes [provider]  diltiazem (CARDIZEM) 120 MG tablet Take 120 mg by mouth daily. 07/07/23  Yes [provider]  escitalopram (LEXAPRO) 10 MG tablet Take 20 mg by mouth daily. 07/05/23  Yes [provider]  ferrous sulfate 325 (65 FE) MG tablet Take 325 mg by mouth daily with breakfast.   Yes  [provider]  furosemide (LASIX) 40 MG tablet TAKE ONE TABLET BY MOUTH DAILY. 08/18/18  Yes Laqueta Linden, MD  levothyroxine (SYNTHROID) 50 MCG tablet Take 50 mcg by mouth daily before breakfast.   Yes [provider]  magnesium oxide (MAG-OX) 400 MG tablet Take 400 mg by mouth daily.   Yes [provider]  montelukast (SINGULAIR) 10 MG tablet Take 10 mg by mouth daily. 07/07/23  Yes [provider]  pantoprazole (PROTONIX) 40 MG tablet Take 1 tablet (40 mg total) by mouth 2 (two) times daily before a meal. X 1 month, then once daily forever Patient taking differently: Take 40 mg by mouth daily. X 1 month, then once daily forever 05/21/19  Yes Tat, Onalee Hua, MD  Potassium Chloride ER 20 MEQ TBCR One po daily Patient taking differently: Take 1 tablet by mouth daily. One po daily 08/15/17  Yes Merlyn Albert, MD  acetaminophen (TYLENOL) 500 MG tablet Take 500 mg by mouth daily as needed for headache.    [provider]  albuterol (PROVENTIL) (2.5 MG/3ML) 0.083% nebulizer solution Take 3 mLs (2.5 mg total) by nebulization every 6 (six) hours as needed for wheezing or shortness of breath. 06/05/16   Merlyn Albert, MD  azithromycin (ZITHROMAX) 250 MG tablet Take 1 tablet (250 mg total) by mouth daily. Take first 2 tablets together, then 1 every day until finished. 01/05/23   Peter Garter, PA  ciprofloxacin (CIPRO) 500 MG tablet Take 500 mg by mouth 2 (two) times daily. 07/22/23   [provider]  diltiazem (CARDIZEM CD) 120 MG 24 hr capsule Take 1 capsule (120 mg total) by mouth daily. 05/21/19   Catarina Hartshorn, MD  diphenoxylate-atropine (LOMOTIL) 2.5-0.025 MG tablet TAKE ONE TABLET BY MOUTH 2 TIMES A DAY AS NEEDED. 08/10/18   Setzer, Brand Males, NP  ELIQUIS 5 MG TABS tablet Take 5 mg by mouth 2 (two) times daily. 07/07/23   [provider]  FLOVENT DISKUS 100 MCG/BLIST AEPB Inhale 2 puffs into the lungs in the morning and at bedtime.  01/06/20   [provider]  hydrocortisone cream 1 % Apply 1 application topically 2 (two) times daily.    [provider]  hydrOXYzine (ATARAX) 25 MG tablet Take 25 mg by mouth 2 (two) times daily. 07/10/23   [provider]  levofloxacin (LEVAQUIN) 500 MG tablet Take 500 mg by mouth daily.    [provider]  lidocaine (XYLOCAINE) 2 % solution Use as directed 10 mLs in the mouth or throat 4 (four) times daily -  with meals and at bedtime. 05/20/19   Catarina Hartshorn, MD  meclizine (ANTIVERT) 25 MG tablet Take 1 tablet (25 mg total) by mouth 3 (three) times daily as needed for dizziness. 04/14/18   Merlyn Albert, MD  metoCLOPramide (REGLAN) 5 MG tablet Take 1 tablet (5 mg total) by mouth 3 (three) times daily before meals. 05/21/19   Catarina Hartshorn, MD  nitroGLYCERIN (NITROLINGUAL) 0.4 MG/SPRAY spray Place 1 spray under the tongue every 5 (five) minutes x 3 doses as needed for chest pain. 06/04/16   Laqueta Linden, MD  PROAIR RESPICLICK 108 [phone] Base) MCG/ACT AEPB Inhale 2 puffs into the lungs every 6 (six) hours as needed (wheezing, shortness of breath). 07/07/23   [provider]  rivaroxaban (XARELTO) 20 MG TABS tablet Take 20 mg by mouth daily with supper.    [provider]  rosuvastatin (CRESTOR) 20 MG tablet Take 20 mg by mouth daily.    [provider]  simethicone (MYLICON) 125 MG chewable tablet Chew 125 mg by mouth 3 (three) times daily with meals.    [provider]  triamcinolone cream (KENALOG) 0.1 % Apply 1  application topically 2 (two) times daily. 08/18/18   Merlyn Albert, MD     Allergies:     Allergies  Allergen Reactions   Cefprozil Nausea And Vomiting    Unknown   Demerol Nausea And Vomiting   Meperidine Hcl Other (See Comments)    Unknown     Physical Exam:   Vitals  Blood pressure (!) 123/92, pulse (!) 111, temperature 98.3 F (36.8 C), temperature source Oral, resp. rate (!) 22, weight  117.9 kg, SpO2 96%.   1. General Elderly female, chronically appearing, laying in bed in no apparent distress  2.  Patient is awake, opening her eyes, smiles, answering simple questions, but appears with advanced dementia, she is extremely hard of hearing  3. No F.N deficits, ALL C.Nerves Intact, Strength 5/5 all 4 extremities, Sensation intact all 4 extremities, Plantars down going.  4. Ears and Eyes appear Normal, Conjunctivae clear, PERRLA.  Dry oral Mucosa.  5. Supple Neck, No JVD,  No Carotid Bruits.  6. Symmetrical Chest wall movement, mildly tachypneic with wheezing  7.  Irregular and tachycardic, No Gallops, Rubs or Murmurs, No Parasternal Heave.  8. Positive Bowel Sounds, Abdomen Soft, No tenderness, No organomegaly appriciated,No rebound -guarding or rigidity.  9.  No Cyanosis, Normal Skin Turgor, No Skin Rash or Bruise.  10. Good muscle tone,  joints appear normal , no effusions, patient with significant liquid collection in the forearm area, nontender, not warm, please see picture below       Data Review:    CBC Recent Labs  Lab 07/24/23 1323  WBC 3.8*  HGB 12.7  HCT 42.5  PLT 225  MCV 90.6  MCH 27.1  MCHC 29.9*  RDW 17.4*  LYMPHSABS 0.5*  MONOABS 0.5  EOSABS 0.0  BASOSABS 0.0   ------------------------------------------------------------------------------------------------------------------  Chemistries  Recent Labs  Lab 07/24/23 1323  NA 141  K 3.7  CL 105  CO2 27  GLUCOSE 87  BUN 11  CREATININE 0.67  CALCIUM 8.6*  AST 23  ALT 10  ALKPHOS 84  BILITOT 0.6   ------------------------------------------------------------------------------------------------------------------ estimated creatinine clearance is 68.3 mL/min (by C-G formula based on SCr of 0.67 mg/dL). ------------------------------------------------------------------------------------------------------------------ No results for input(s): "TSH", "T4TOTAL", "T3FREE", "THYROIDAB"  in the last 72 hours.  Invalid input(s): "FREET3"  Coagulation profile Recent Labs  Lab 07/24/23 1323  INR 1.3*   ------------------------------------------------------------------------------------------------------------------- No results for input(s): "DDIMER" in the last 72 hours. -------------------------------------------------------------------------------------------------------------------  Cardiac Enzymes No results for input(s): "CKMB", "TROPONINI", "MYOGLOBIN" in the last 168 hours.  Invalid input(s): "CK" ------------------------------------------------------------------------------------------------------------------    Component Value Date/Time   BNP 106.0 (H) 04/20/2022 1655     ---------------------------------------------------------------------------------------------------------------  Urinalysis    Component Value Date/Time   COLORURINE YELLOW 07/05/2023 1934   APPEARANCEUR CLOUDY (A) 07/05/2023 1934   LABSPEC 1.018 07/05/2023 1934   PHURINE 6.0 07/05/2023 1934   GLUCOSEU NEGATIVE 07/05/2023 1934   HGBUR NEGATIVE 07/05/2023 1934   BILIRUBINUR NEGATIVE 07/05/2023 1934   KETONESUR 5 (A) 07/05/2023 1934   PROTEINUR NEGATIVE 07/05/2023 1934   UROBILINOGEN 0.2 05/08/2014 2240   NITRITE NEGATIVE 07/05/2023 1934   LEUKOCYTESUR LARGE (A) 07/05/2023 1934    ----------------------------------------------------------------------------------------------------------------   Imaging Results:    CT CHEST ABDOMEN PELVIS W CONTRAST Result Date: 07/24/2023 CLINICAL DATA:  Sepsis. Urinary tract infection. Previous appendectomy, hysterectomy, oophorectomy and cholecystectomy. EXAM: CT CHEST, ABDOMEN, AND PELVIS WITH CONTRAST TECHNIQUE: Multidetector CT imaging of the chest, abdomen and pelvis was performed following the standard protocol during bolus administration of  intravenous contrast. RADIATION DOSE REDUCTION: This exam was performed according to the  departmental dose-optimization program which includes automated exposure control, adjustment of the mA and/or kV according to patient size and/or use of iterative reconstruction technique. CONTRAST:  OMNIPAQUE IOHEXOL 300 MG/ML  SOLN COMPARISON:  Abdomen and pelvis CT dated 04/24/2018. Portable chest obtained earlier today. Chest CT report dated 05/15/2019. Chest CTA dated 10/18/2008. FINDINGS: CT CHEST FINDINGS Cardiovascular: Stable mildly enlarged heart aortic valve calcifications and dense mitral valve annulus calcifications. Atheromatous calcifications, including the coronary arteries and aorta. The central pulmonary arteries remain mildly dilated with a main pulmonary artery diameter of 3.4 cm. Mediastinum/Nodes: No enlarged mediastinal, hilar, or axillary lymph nodes. Interval flattening of the trachea and mainstem bronchi with an interval decreased depth of inspiration. Unremarkable thyroid gland and esophagus. Lungs/Pleura: Decreased depth of inspiration. Mild bilateral lower lobe cylindrical bronchiectasis and associated mild parenchymal densities. Prominent subpleural fat on the left causing the blunting of the lateral costophrenic angle seen radiographically. No pleural fluid Musculoskeletal: Thoracic spine degenerative changes multiple thoracic vertebral compression deformities with mild bony retropulsion and no visible acute fracture lines. Associated acute kyphosis in the upper thoracic spine. CT ABDOMEN PELVIS FINDINGS Hepatobiliary: No focal liver abnormality is seen. Status post cholecystectomy. No biliary dilatation. Pancreas: Unremarkable. No pancreatic ductal dilatation or surrounding inflammatory changes. Spleen: Normal in size without focal abnormality. Adrenals/Urinary Tract: Normal-appearing adrenal glands multiple bilateral simple appearing renal cysts. These do not need imaging follow-up unremarkable urinary bladder and ureters Stomach/Bowel: Large number of sigmoid colon diverticula  without evidence of diverticulitis. Breathing motion blurring obscuring the detail in the right lower quadrant of the abdomen. There is some fluid in gas in that region of the abdomen that is most likely intraluminal. However, due to the motion artifact, it is difficult to exclude a fluid or gas collection in that area. Surgically absent appendix by history. Unremarkable stomach. Vascular/Lymphatic: Atheromatous arterial calcifications without aneurysm. No enlarged lymph nodes. Reproductive: Status post hysterectomy. No adnexal masses. Other: Anterior subcutaneous edema and anterior subcutaneous air, compatible with recent subcutaneous injection. Moderate-sized left inguinal hernia containing fat and small to moderate-sized right inguinal hernia containing fat Musculoskeletal: Lumbar spine degenerative changes. Multiple lumbar spine vertebral compression deformities that are new since 04/24/2018. Stable old T12 vertebral compression deformity. Possible acute fracture line in the anterior, superior corner of the L2 vertebral body and possible acute fracture line in the superior endplate of the L4 vertebral body. IMPRESSION: 1. Fluid and gas in the right lower quadrant of the abdomen which is most likely within the bowel. However, this can not be confirmed as intraluminal due to motion artifacts in that area. Correlation with the presence or absence of focal pain and tenderness in that area is recommended to help exclude the possibility of an abscess or contained perforation in the right lower quadrant of the abdomen. 2. Otherwise, no acute abnormality seen. 3. Decreased depth of inspiration with flattening of the trachea and mainstem bronchi, compatible with tracheobronchomalacia. 4. Mild bilateral lower lobe cylindrical bronchiectasis and associated mild parenchymal densities, most likely due to atelectasis. 5. Stable mild cardiomegaly. 6. Mildly dilated central pulmonary arteries, suggesting pulmonary arterial  hypertension. 7. Multiple thoracic and lumbar spine vertebral compression deformities, some of which are new since 04/24/2018. Possible acute fracture lines in the anterior, superior corner of the L2 vertebral body and superior endplate of the L4 vertebral body. 8. Sigmoid colon diverticulosis. 9. Moderate-sized left inguinal hernia containing fat and small to moderate-sized right  inguinal hernia containing fat. 10. Aortic atherosclerosis. Aortic Atherosclerosis (ICD10-I70.0). Electronically Signed   By: Beckie Salts M.D.   On: 07/24/2023 16:45   CT Head Wo Contrast Result Date: 07/24/2023 CLINICAL DATA:  Mental status change. Current therapy for urinary tract infection. EXAM: CT HEAD WITHOUT CONTRAST TECHNIQUE: Contiguous axial images were obtained from the base of the skull through the vertex without intravenous contrast. RADIATION DOSE REDUCTION: This exam was performed according to the departmental dose-optimization program which includes automated exposure control, adjustment of the mA and/or kV according to patient size and/or use of iterative reconstruction technique. COMPARISON:  01/05/2023 FINDINGS: Brain: No evidence of acute infarction, hemorrhage, hydrocephalus, extra-axial collection or mass lesion/mass effect. Vascular: Encephalomalacia within the right occipital and posterior temporal lobes consistent with remote PCA infarct. Ex vacuo dilatation of the right posterolateral ventricle noted. There is mild diffuse low-attenuation within the subcortical and periventricular white matter compatible with chronic microvascular disease. Skull: Normal. Negative for fracture or focal lesion. Sinuses/Orbits: Air-fluid level within the sphenoid sinus. Partial opacification of the ethmoid air cells. Other: None. IMPRESSION: 1. No acute intracranial abnormalities. 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 4. Air-fluid level within the sphenoid sinus. Correlate for any clinical signs or  symptoms of acute sinusitis. Electronically Signed   By: Signa Kell M.D.   On: 07/24/2023 16:15   DG Chest Port 1 View Result Date: 07/24/2023 CLINICAL DATA:  Questionable sepsis - evaluate for abnormality. Weakness. Altered mental status. EXAM: PORTABLE CHEST 1 VIEW COMPARISON:  01/05/2023. FINDINGS: Redemonstration of blunting of left lateral costophrenic angle, similar to the prior study, which may represent small left pleural effusion. There are probable associated atelectatic changes at the left lung base. Bilateral lung fields are otherwise clear. No acute consolidation or lung collapse. Right lateral costophrenic angle is clear. Stable cardio-mediastinal silhouette. No acute osseous abnormalities. The soft tissues are within normal limits. There are surgical clips in the right upper quadrant, typical of a previous cholecystectomy. IMPRESSION: *Persistent blunting of left lateral costophrenic angle, which may represent small left pleural effusion. *Otherwise no acute cardiopulmonary abnormality seen. Electronically Signed   By: Jules Schick M.D.   On: 07/24/2023 14:16    EKG: Vent. rate 136 BPM PR interval * ms QRS duration 82 ms QT/QTcB 301/453 ms P-R-T axes * -6 7 Atrial fibrillation Low voltage, precordial leads Borderline T abnormalities, diffuse leads No significant change since prior 1/25   Assessment & Plan:    Principal Problem:   Atrial fibrillation with RVR (HCC) Active Problems:   Controlled type 2 diabetes mellitus (HCC)   Coronary atherosclerosis   Obesity   CKD (chronic kidney disease), stage II   AF (paroxysmal atrial fibrillation) (HCC)   Acute metabolic encephalopathy -Most likely in setting of infectious process, including influenza -CT head with no acute findings -Patient has mildly improved, but he remains somnolent but wakes up to verbal stimuli, will keep n.p.o. for now till she is more awake and appropriate -Will obtain EP consult in a.m. -Per  reports patient was treated for UTI at facility, UA is pending, but she is on IV antibiotics, please see discussion below  Influenza infection -Started on Tamiflu  A-fib with RVR -On Xarelto for anticoagulation, but I will hold this evening pending general surgery and evaluation, please see discussion below -Will start on Cardizem drip  Questionable right lower quadrant fluid collection, likely just just fluid-gas most likely within the bowel -CT abdomen showing fluid and gas in the right  lower quadrant of the abdomen, possibly within bowel, been difficult to confirm due to motion artifact , mentation to exclude possibility of abscess or contained perforation -Will consult general surgery -She remains n.p.o. -Will start on Zosyn -Hold Xarelto  History of PE -Resume Xarelto once cleared by general surgery  Dementia Deconditioning -Bedbound at baseline -Continue with supportive care  Hyperlipidemia -continue with statin  Hypothyroidism -Continue with Synthroid  Hypertension -Continue with Cardizem CD when able to take oral, meanwhile she is on Cardizem drip  Right forearm fluid collection likely hematoma -Is not warm or tender, likely related to hematoma from recent fall, ED physician attempted to aspirate only small amount of blood came out   DVT Prophylaxis Xarelto  AM Labs Ordered, also please review Full Orders  Family Communication: Admission, patients condition and plan of care including tests being ordered have been discussed with the patient and HCPOA her brother Link Snuffer , as well discussed with next of kin her niece Jasmine December* who indicate understanding and agree with the plan and Code Status.  Code Status DNR, confirmed by brother  Likely DC to back to [facility] called: ED discussed with general surgery  Admission status: Inpatient  Time spent in minutes : 75 minutes   Huey Bienenstock M.D on 07/24/2023 at 8:35 PM   [facility] -  Office  [phone]

## 2023-07-24 NOTE — ED Provider Notes (Signed)
Patient with altered mental status and lethargy.  She has influenza and questionable abscess on CT scan of the abdomen.  I spoke with general surgery and they do not think this is an abscess.  They will consult on the patient tomorrow.  Patient will be admitted to medicine with altered mental status and tachycardia with the flu   Bethann Berkshire, MD 07/24/23 1815

## 2023-07-24 NOTE — ED Triage Notes (Signed)
EMS reports pt with current treatment for UTI and called for altered mental status.

## 2023-07-24 NOTE — ED Notes (Signed)
IV attempted without success; admitting doctor notified

## 2023-07-24 NOTE — Consult Note (Signed)
Pharmacy Antibiotic Note  [name] is a 83 y.o. female admitted on 07/24/2023 with altered mental status and Afib.  Pharmacy has been consulted for Zosyn dosing for intraabdominal infection.  Plan: Zosyn 3.375g IV q8h (4 hour infusion).  Weight: 117.9 kg (260 lb)  Temp (24hrs), Avg:98.3 F (36.8 C), Min:98.3 F (36.8 C), Max:98.3 F (36.8 C)  Recent Labs  Lab 07/24/23 1323 07/24/23 1444  WBC 3.8*  --   CREATININE 0.67  --   LATICACIDVEN 1.3 1.4    Estimated Creatinine Clearance: 68.3 mL/min (by C-G formula based on SCr of 0.67 mg/dL).    Allergies  Allergen Reactions   Cefprozil Nausea And Vomiting    Unknown   Demerol Nausea And Vomiting   Meperidine Hcl Other (See Comments)    Unknown    Antimicrobials this admission: Zosyn 1/30 >>    Dose adjustments this admission: N/A  Microbiology results: 1/30 BCx: pending   Thank you for allowing pharmacy to be a part of this patient's care.  Barrie Folk 07/24/2023 8:41 PM

## 2023-07-24 NOTE — ED Provider Notes (Signed)
[place] EMERGENCY DEPARTMENT AT [facility] Provider Note   CSN: [id number] Arrival date & time: 07/24/23  1207     History  No chief complaint on file.   [name] is a 83 y.o. female.  Level 5 caveat secondary to altered mental status.  Patient brought in by ambulance from her nursing facility for altered mental status since yesterday.  Reportedly is on antibiotics for UTI.  No other information available at this time.  Patient unable to give any history although is awake.  The history is provided by the EMS personnel.  Altered Mental Status Presenting symptoms: lethargy   Most recent episode:  Yesterday Timing:  Constant Progression:  Unchanged Context: nursing home resident and recent change in medication        Home Medications Prior to Admission medications   Medication Sig Start Date End Date Taking? Authorizing Provider  acetaminophen (TYLENOL) 500 MG tablet Take 500 mg by mouth daily as needed for headache.    [provider]  albuterol (PROVENTIL) (2.5 MG/3ML) 0.083% nebulizer solution Take 3 mLs (2.5 mg total) by nebulization every 6 (six) hours as needed for wheezing or shortness of breath. 06/05/16   Merlyn Albert, MD  albuterol (VENTOLIN HFA) 108 (90 Base) MCG/ACT inhaler INHALE 2 PUFFS INTO THE LUNGS 4 TIMES DAILY AS NEEDED FOR WHEEZING. 03/04/18   Merlyn Albert, MD  azithromycin (ZITHROMAX) 250 MG tablet Take 1 tablet (250 mg total) by mouth daily. Take first 2 tablets together, then 1 every day until finished. 01/05/23   Peter Garter, PA  Cholecalciferol (VITAMIN D3) 1000 units CAPS Take 1 capsule every evening by mouth.     [provider]  conjugated estrogens (PREMARIN) vaginal cream Place 1 Applicatorful vaginally 2 (two) times a week. On Mondays and Fridays    [provider]  diltiazem (CARDIZEM CD) 120 MG 24 hr capsule Take 1 capsule (120 mg total) by mouth daily. 05/21/19   Catarina Hartshorn, MD   diphenoxylate-atropine (LOMOTIL) 2.5-0.025 MG tablet TAKE ONE TABLET BY MOUTH 2 TIMES A DAY AS NEEDED. 08/10/18   Setzer, Brand Males, NP  escitalopram (LEXAPRO) 20 MG tablet TAKE ONE TABLET BY MOUTH ONCE DAILY. 03/04/18   Merlyn Albert, MD  FLOVENT DISKUS 100 MCG/BLIST AEPB Inhale 2 puffs into the lungs in the morning and at bedtime. 01/06/20   [provider]  furosemide (LASIX) 40 MG tablet TAKE ONE TABLET BY MOUTH DAILY. 08/18/18   Laqueta Linden, MD  hydrocortisone cream 1 % Apply 1 application topically 2 (two) times daily.    [provider]  levothyroxine (SYNTHROID) 50 MCG tablet Take 50 mcg by mouth daily before breakfast.    [provider]  lidocaine (XYLOCAINE) 2 % solution Use as directed 10 mLs in the mouth or throat 4 (four) times daily -  with meals and at bedtime. 05/20/19   Catarina Hartshorn, MD  magnesium oxide (MAG-OX) 400 MG tablet Take 400 mg by mouth daily.    [provider]  meclizine (ANTIVERT) 25 MG tablet Take 1 tablet (25 mg total) by mouth 3 (three) times daily as needed for dizziness. 04/14/18   Merlyn Albert, MD  metoCLOPramide (REGLAN) 5 MG tablet Take 1 tablet (5 mg total) by mouth 3 (three) times daily before meals. 05/21/19   Catarina Hartshorn, MD  nitroGLYCERIN (NITROLINGUAL) 0.4 MG/SPRAY spray Place 1 spray under the tongue every 5 (five) minutes x 3 doses as needed for chest  pain. 06/04/16   Laqueta Linden, MD  pantoprazole (PROTONIX) 40 MG tablet Take 1 tablet (40 mg total) by mouth 2 (two) times daily before a meal. X 1 month, then once daily forever 05/21/19   Catarina Hartshorn, MD  Potassium Chloride ER 20 MEQ TBCR One po daily 08/15/17   Merlyn Albert, MD  rivaroxaban (XARELTO) 20 MG TABS tablet Take 20 mg by mouth daily with supper.    [provider]  rosuvastatin (CRESTOR) 20 MG tablet Take 20 mg by mouth daily.    [provider]  simethicone (MYLICON) 125 MG chewable tablet Chew 125 mg by mouth 3  (three) times daily with meals.    [provider]  triamcinolone cream (KENALOG) 0.1 % Apply 1 application topically 2 (two) times daily. 08/18/18   Merlyn Albert, MD      Allergies    Cefprozil, Demerol, and Meperidine hcl    Review of Systems   Review of Systems  Unable to perform ROS: Mental status change    Physical Exam Updated Vital Signs BP (!) 135/100   Pulse (!) 104   Temp 98.3 F (36.8 C) (Oral)   Resp (!) 21   Wt 117.9 kg   SpO2 96%   BMI 41.96 kg/m  Physical Exam Vitals and nursing note reviewed.  Constitutional:      General: She is not in acute distress.    Appearance: She is well-developed. She is obese. She is ill-appearing.  HENT:     Head: Normocephalic and atraumatic.  Eyes:     Conjunctiva/sclera: Conjunctivae normal.  Cardiovascular:     Rate and Rhythm: Tachycardia present. Rhythm irregular.     Heart sounds: No murmur heard. Pulmonary:     Effort: Pulmonary effort is normal. No respiratory distress.     Breath sounds: Normal breath sounds.  Abdominal:     Palpations: Abdomen is soft.     Tenderness: There is no abdominal tenderness. There is no guarding or rebound.  Musculoskeletal:        General: Swelling present.     Cervical back: Neck supple.     Comments: She has a large question hematoma right forearm.  Skin:    General: Skin is warm and dry.     Capillary Refill: Capillary refill takes less than 2 seconds.  Neurological:     Comments: Patient is awake but not interactive.  She is not following any commands.  Unable to comply with neurologic exam.     ED Results / Procedures / Treatments   Labs (all labs ordered are listed, but only abnormal results are displayed) Labs Reviewed  RESP PANEL BY RT-PCR (RSV, FLU A&B, COVID)  RVPGX2 - Abnormal; Notable for the following components:      Result Value   Influenza A by PCR POSITIVE (*)    All other components within normal limits  COMPREHENSIVE METABOLIC PANEL -  Abnormal; Notable for the following components:   Calcium 8.6 (*)    Albumin 2.8 (*)    All other components within normal limits  CBC WITH DIFFERENTIAL/PLATELET - Abnormal; Notable for the following components:   WBC 3.8 (*)    MCHC 29.9 (*)    RDW 17.4 (*)    Lymphs Abs 0.5 (*)    All other components within normal limits  PROTIME-INR - Abnormal; Notable for the following components:   Prothrombin Time 16.3 (*)    INR 1.3 (*)    All other components within  normal limits  APTT - Abnormal; Notable for the following components:   aPTT 42 (*)    All other components within normal limits  CULTURE, BLOOD (ROUTINE X 2)  CULTURE, BLOOD (ROUTINE X 2)  LACTIC ACID, PLASMA  LACTIC ACID, PLASMA  URINALYSIS, W/ REFLEX TO CULTURE (INFECTION SUSPECTED)    EKG EKG Interpretation Date/Time:  Thursday July 24 2023 13:36:03 EST Ventricular Rate:  136 PR Interval:    QRS Duration:  82 QT Interval:  301 QTC Calculation: 453 R Axis:   -6  Text Interpretation: Atrial fibrillation Low voltage, precordial leads Borderline T abnormalities, diffuse leads No significant change since prior 1/25 Confirmed by Meridee Score [phone]) on 07/24/2023 3:09:32 PM  Radiology CT CHEST ABDOMEN PELVIS W CONTRAST Result Date: 07/24/2023 CLINICAL DATA:  Sepsis. Urinary tract infection. Previous appendectomy, hysterectomy, oophorectomy and cholecystectomy. EXAM: CT CHEST, ABDOMEN, AND PELVIS WITH CONTRAST TECHNIQUE: Multidetector CT imaging of the chest, abdomen and pelvis was performed following the standard protocol during bolus administration of intravenous contrast. RADIATION DOSE REDUCTION: This exam was performed according to the departmental dose-optimization program which includes automated exposure control, adjustment of the mA and/or kV according to patient size and/or use of iterative reconstruction technique. CONTRAST:  OMNIPAQUE IOHEXOL 300 MG/ML  SOLN COMPARISON:  Abdomen and pelvis CT dated  04/24/2018. Portable chest obtained earlier today. Chest CT report dated 05/15/2019. Chest CTA dated 10/18/2008. FINDINGS: CT CHEST FINDINGS Cardiovascular: Stable mildly enlarged heart aortic valve calcifications and dense mitral valve annulus calcifications. Atheromatous calcifications, including the coronary arteries and aorta. The central pulmonary arteries remain mildly dilated with a main pulmonary artery diameter of 3.4 cm. Mediastinum/Nodes: No enlarged mediastinal, hilar, or axillary lymph nodes. Interval flattening of the trachea and mainstem bronchi with an interval decreased depth of inspiration. Unremarkable thyroid gland and esophagus. Lungs/Pleura: Decreased depth of inspiration. Mild bilateral lower lobe cylindrical bronchiectasis and associated mild parenchymal densities. Prominent subpleural fat on the left causing the blunting of the lateral costophrenic angle seen radiographically. No pleural fluid Musculoskeletal: Thoracic spine degenerative changes multiple thoracic vertebral compression deformities with mild bony retropulsion and no visible acute fracture lines. Associated acute kyphosis in the upper thoracic spine. CT ABDOMEN PELVIS FINDINGS Hepatobiliary: No focal liver abnormality is seen. Status post cholecystectomy. No biliary dilatation. Pancreas: Unremarkable. No pancreatic ductal dilatation or surrounding inflammatory changes. Spleen: Normal in size without focal abnormality. Adrenals/Urinary Tract: Normal-appearing adrenal glands multiple bilateral simple appearing renal cysts. These do not need imaging follow-up unremarkable urinary bladder and ureters Stomach/Bowel: Large number of sigmoid colon diverticula without evidence of diverticulitis. Breathing motion blurring obscuring the detail in the right lower quadrant of the abdomen. There is some fluid in gas in that region of the abdomen that is most likely intraluminal. However, due to the motion artifact, it is difficult to  exclude a fluid or gas collection in that area. Surgically absent appendix by history. Unremarkable stomach. Vascular/Lymphatic: Atheromatous arterial calcifications without aneurysm. No enlarged lymph nodes. Reproductive: Status post hysterectomy. No adnexal masses. Other: Anterior subcutaneous edema and anterior subcutaneous air, compatible with recent subcutaneous injection. Moderate-sized left inguinal hernia containing fat and small to moderate-sized right inguinal hernia containing fat Musculoskeletal: Lumbar spine degenerative changes. Multiple lumbar spine vertebral compression deformities that are new since 04/24/2018. Stable old T12 vertebral compression deformity. Possible acute fracture line in the anterior, superior corner of the L2 vertebral body and possible acute fracture line in the superior endplate of the L4 vertebral body. IMPRESSION: 1. Fluid and gas  in the right lower quadrant of the abdomen which is most likely within the bowel. However, this can not be confirmed as intraluminal due to motion artifacts in that area. Correlation with the presence or absence of focal pain and tenderness in that area is recommended to help exclude the possibility of an abscess or contained perforation in the right lower quadrant of the abdomen. 2. Otherwise, no acute abnormality seen. 3. Decreased depth of inspiration with flattening of the trachea and mainstem bronchi, compatible with tracheobronchomalacia. 4. Mild bilateral lower lobe cylindrical bronchiectasis and associated mild parenchymal densities, most likely due to atelectasis. 5. Stable mild cardiomegaly. 6. Mildly dilated central pulmonary arteries, suggesting pulmonary arterial hypertension. 7. Multiple thoracic and lumbar spine vertebral compression deformities, some of which are new since 04/24/2018. Possible acute fracture lines in the anterior, superior corner of the L2 vertebral body and superior endplate of the L4 vertebral body. 8. Sigmoid  colon diverticulosis. 9. Moderate-sized left inguinal hernia containing fat and small to moderate-sized right inguinal hernia containing fat. 10. Aortic atherosclerosis. Aortic Atherosclerosis (ICD10-I70.0). Electronically Signed   By: Beckie Salts M.D.   On: 07/24/2023 16:45   CT Head Wo Contrast Result Date: 07/24/2023 CLINICAL DATA:  Mental status change. Current therapy for urinary tract infection. EXAM: CT HEAD WITHOUT CONTRAST TECHNIQUE: Contiguous axial images were obtained from the base of the skull through the vertex without intravenous contrast. RADIATION DOSE REDUCTION: This exam was performed according to the departmental dose-optimization program which includes automated exposure control, adjustment of the mA and/or kV according to patient size and/or use of iterative reconstruction technique. COMPARISON:  01/05/2023 FINDINGS: Brain: No evidence of acute infarction, hemorrhage, hydrocephalus, extra-axial collection or mass lesion/mass effect. Vascular: Encephalomalacia within the right occipital and posterior temporal lobes consistent with remote PCA infarct. Ex vacuo dilatation of the right posterolateral ventricle noted. There is mild diffuse low-attenuation within the subcortical and periventricular white matter compatible with chronic microvascular disease. Skull: Normal. Negative for fracture or focal lesion. Sinuses/Orbits: Air-fluid level within the sphenoid sinus. Partial opacification of the ethmoid air cells. Other: None. IMPRESSION: 1. No acute intracranial abnormalities. 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 4. Air-fluid level within the sphenoid sinus. Correlate for any clinical signs or symptoms of acute sinusitis. Electronically Signed   By: Signa Kell M.D.   On: 07/24/2023 16:15   DG Chest Port 1 View Result Date: 07/24/2023 CLINICAL DATA:  Questionable sepsis - evaluate for abnormality. Weakness. Altered mental status. EXAM: PORTABLE CHEST 1 VIEW  COMPARISON:  01/05/2023. FINDINGS: Redemonstration of blunting of left lateral costophrenic angle, similar to the prior study, which may represent small left pleural effusion. There are probable associated atelectatic changes at the left lung base. Bilateral lung fields are otherwise clear. No acute consolidation or lung collapse. Right lateral costophrenic angle is clear. Stable cardio-mediastinal silhouette. No acute osseous abnormalities. The soft tissues are within normal limits. There are surgical clips in the right upper quadrant, typical of a previous cholecystectomy. IMPRESSION: *Persistent blunting of left lateral costophrenic angle, which may represent small left pleural effusion. *Otherwise no acute cardiopulmonary abnormality seen. Electronically Signed   By: Jules Schick M.D.   On: 07/24/2023 14:16    Procedures .Critical Care  Performed by: Terrilee Files, MD Authorized by: Terrilee Files, MD   Critical care provider statement:    Critical care time (minutes):  45   Critical care time was exclusive of:  Separately billable procedures and treating other patients  Critical care was necessary to treat or prevent imminent or life-threatening deterioration of the following conditions:  CNS failure or compromise and circulatory failure   Critical care was time spent personally by me on the following activities:  Development of treatment plan with patient or surrogate, discussions with consultants, evaluation of patient's response to treatment, examination of patient, obtaining history from patient or surrogate, ordering and performing treatments and interventions, ordering and review of laboratory studies, ordering and review of radiographic studies, pulse oximetry, re-evaluation of patient's condition and review of old charts   I assumed direction of critical care for this patient from another provider in my specialty: no       Medications Ordered in ED Medications  lactated  ringers infusion ( Intravenous New Bag/Given 07/24/23 1425)  lactated ringers bolus 1,000 mL (0 mLs Intravenous Stopped 07/24/23 1524)  diltiazem (CARDIZEM) injection 20 mg (20 mg Intravenous Given 07/24/23 1534)  iohexol (OMNIPAQUE) 300 MG/ML solution 100 mL (100 mLs Intravenous Contrast Given 07/24/23 1514)    ED Course/ Medical Decision Making/ A&P Clinical Course as of 07/24/23 1738  Thu Jul 24, 2023  1332 X-ray interpreted by me as poor inspiration no gross infiltrate.  Awaiting radiology reading. [MB]    Clinical Course User Index [MB] Terrilee Files, MD                                 Medical Decision Making Amount and/or Complexity of Data Reviewed Labs: ordered. Radiology: ordered.  Risk Prescription drug management.   This patient complains of altered mental status; this involves an extensive number of treatment Options and is a complaint that carries with it a high risk of complications and morbidity. The differential includes infection, shock, sepsis, stroke, bleed, metabolic derangement, hypoglycemia  I ordered, reviewed and interpreted labs, which included CBC with lower white count, chemistries fairly unremarkable, lactate normal, blood culture sent, urinalysis ordered, flu positive I ordered medication IV fluids IV rate control and reviewed PMP when indicated. I ordered imaging studies which included chest x-ray, CT head chest abdomen and pelvis and I independently    visualized and interpreted imaging which showed no clear infiltrate on x-ray.  CTs are pending at time of signout Additional history obtained from EMS Previous records obtained and reviewed in epic, patient was in the ED a few weeks ago for cystitis Cardiac monitoring reviewed, atrial fibrillation with RVR Social determinants considered, no significant barriers Critical Interventions: Extensive workup for patient's altered mental status and IV medications for patient's rapid ventricular  response  After the interventions stated above, I reevaluated the patient and found patient's heart rate to be improving. Admission and further testing considered, she will need admission to the hospital.  Her care is signed out to Dr. Estell Harpin to follow-up on results of CT and to discuss with hospitalist regarding final disposition.         Final Clinical Impression(s) / ED Diagnoses Final diagnoses:  Altered mental status, unspecified altered mental status type  Atrial fibrillation with rapid ventricular response (HCC)  Acute encephalopathy    Rx / DC Orders ED Discharge Orders     None         Terrilee Files, MD 07/24/23 [phone]

## 2023-07-25 ENCOUNTER — Other Ambulatory Visit: Payer: Self-pay

## 2023-07-25 ENCOUNTER — Inpatient Hospital Stay (HOSPITAL_COMMUNITY): Payer: Medicare (Managed Care)

## 2023-07-25 DIAGNOSIS — I4891 Unspecified atrial fibrillation: Secondary | ICD-10-CM | POA: Diagnosis not present

## 2023-07-25 DIAGNOSIS — G9340 Encephalopathy, unspecified: Secondary | ICD-10-CM

## 2023-07-25 DIAGNOSIS — R4182 Altered mental status, unspecified: Secondary | ICD-10-CM

## 2023-07-25 DIAGNOSIS — R935 Abnormal findings on diagnostic imaging of other abdominal regions, including retroperitoneum: Secondary | ICD-10-CM

## 2023-07-25 DIAGNOSIS — I480 Paroxysmal atrial fibrillation: Secondary | ICD-10-CM | POA: Diagnosis not present

## 2023-07-25 DIAGNOSIS — E118 Type 2 diabetes mellitus with unspecified complications: Secondary | ICD-10-CM | POA: Diagnosis not present

## 2023-07-25 DIAGNOSIS — J101 Influenza due to other identified influenza virus with other respiratory manifestations: Secondary | ICD-10-CM

## 2023-07-25 LAB — CBC
HCT: 40.7 % (ref 36.0–46.0)
Hemoglobin: 11.7 g/dL — ABNORMAL LOW (ref 12.0–15.0)
MCH: 26.9 pg (ref 26.0–34.0)
MCHC: 28.7 g/dL — ABNORMAL LOW (ref 30.0–36.0)
MCV: 93.6 fL (ref 80.0–100.0)
Platelets: 210 10*3/uL (ref 150–400)
RBC: 4.35 MIL/uL (ref 3.87–5.11)
RDW: 17.1 % — ABNORMAL HIGH (ref 11.5–15.5)
WBC: 4.6 10*3/uL (ref 4.0–10.5)
nRBC: 0 % (ref 0.0–0.2)

## 2023-07-25 LAB — BASIC METABOLIC PANEL
Anion gap: 10 (ref 5–15)
BUN: 11 mg/dL (ref 8–23)
CO2: 25 mmol/L (ref 22–32)
Calcium: 8.5 mg/dL — ABNORMAL LOW (ref 8.9–10.3)
Chloride: 107 mmol/L (ref 98–111)
Creatinine, Ser: 0.55 mg/dL (ref 0.44–1.00)
GFR, Estimated: >60 mL/min (ref >60)
Glucose, Bld: 67 mg/dL — ABNORMAL LOW (ref 70–99)
Potassium: 4.3 mmol/L (ref 3.5–5.1)
Sodium: 142 mmol/L (ref 135–145)

## 2023-07-25 LAB — MRSA NEXT GEN BY PCR, NASAL: MRSA by PCR Next Gen: DETECTED — AB

## 2023-07-25 LAB — CBG MONITORING, ED: Glucose-Capillary: 159 mg/dL — ABNORMAL HIGH (ref 70–99)

## 2023-07-25 MED ORDER — FLUTICASONE PROPIONATE HFA 110 MCG/ACT IN AERO: 1.0000 | INHALATION_SPRAY | Freq: Two times a day (BID) | RESPIRATORY_TRACT | Status: DC

## 2023-07-25 MED ORDER — LEVALBUTEROL HCL 0.63 MG/3ML IN NEBU
0.6300 mg | INHALATION_SOLUTION | Freq: Three times a day (TID) | RESPIRATORY_TRACT | Status: DC
  Administered 2023-07-26 – 2023-08-07 (×37): 0.63 mg via RESPIRATORY_TRACT
  Filled 2023-07-25 (×38): qty 3

## 2023-07-25 MED ORDER — DEXTROSE 50 % IV SOLN
25.0000 g | Freq: Once | INTRAVENOUS | Status: AC
  Administered 2023-07-25: 25 g via INTRAVENOUS
  Filled 2023-07-25: qty 50

## 2023-07-25 MED ORDER — BUDESONIDE 0.25 MG/2ML IN SUSP
0.2500 mg | Freq: Two times a day (BID) | RESPIRATORY_TRACT | Status: DC
  Administered 2023-07-25 – 2023-08-03 (×19): 0.25 mg via RESPIRATORY_TRACT
  Filled 2023-07-25 (×19): qty 2

## 2023-07-25 MED ORDER — SODIUM CHLORIDE 0.9% FLUSH: 10.0000 mL | INTRAVENOUS | Status: DC | PRN

## 2023-07-25 MED ORDER — POLYVINYL ALCOHOL 1.4 % OP SOLN: 1.0000 [drp] | Freq: Four times a day (QID) | OPHTHALMIC | Status: DC | PRN

## 2023-07-25 MED ORDER — LEVALBUTEROL HCL 0.63 MG/3ML IN NEBU
0.6300 mg | INHALATION_SOLUTION | Freq: Four times a day (QID) | RESPIRATORY_TRACT | Status: DC | PRN
  Administered 2023-07-26: 0.63 mg via RESPIRATORY_TRACT
  Filled 2023-07-25: qty 3

## 2023-07-25 MED ORDER — DILTIAZEM HCL 60 MG PO TABS
120.0000 mg | ORAL_TABLET | Freq: Every day | ORAL | Status: DC
  Administered 2023-07-25 – 2023-07-28 (×3): 120 mg via ORAL
  Filled 2023-07-25: qty 2
  Filled 2023-07-25: qty 4
  Filled 2023-07-25 (×2): qty 2

## 2023-07-25 MED ORDER — LEVALBUTEROL HCL 0.63 MG/3ML IN NEBU
INHALATION_SOLUTION | RESPIRATORY_TRACT | Status: AC
  Administered 2023-07-25: 0.63 mg via RESPIRATORY_TRACT
  Filled 2023-07-25: qty 3

## 2023-07-25 MED ORDER — AMOXICILLIN-POT CLAVULANATE 875-125 MG PO TABS: 1.0000 | ORAL_TABLET | Freq: Two times a day (BID) | ORAL | Status: DC

## 2023-07-25 MED ORDER — BISACODYL 10 MG RE SUPP
10.0000 mg | Freq: Every day | RECTAL | Status: DC
Start: 2023-07-25 — End: 2023-08-06
  Administered 2023-07-25 – 2023-07-31 (×7): 10 mg via RECTAL
  Filled 2023-07-25 (×12): qty 1

## 2023-07-25 MED ORDER — HYDROXYZINE HCL 25 MG PO TABS
25.0000 mg | ORAL_TABLET | Freq: Two times a day (BID) | ORAL | Status: DC
  Administered 2023-07-25 – 2023-07-30 (×10): 25 mg via ORAL
  Filled 2023-07-25 (×13): qty 1

## 2023-07-25 MED ORDER — LACTATED RINGERS IV SOLN: INTRAVENOUS | Status: AC

## 2023-07-25 MED ORDER — SODIUM CHLORIDE 0.9% FLUSH
10.0000 mL | Freq: Two times a day (BID) | INTRAVENOUS | Status: DC
  Administered 2023-07-26 – 2023-08-05 (×19): 10 mL
  Administered 2023-08-05: 20 mL
  Administered 2023-08-06: 10 mL
  Administered 2023-08-06: 30 mL
  Administered 2023-08-07 – 2023-08-08 (×3): 10 mL

## 2023-07-25 MED ORDER — CHLORHEXIDINE GLUCONATE CLOTH 2 % EX PADS
6.0000 | MEDICATED_PAD | Freq: Every day | CUTANEOUS | Status: DC
  Administered 2023-07-25 – 2023-07-30 (×5): 6 via TOPICAL

## 2023-07-25 MED ORDER — SODIUM CHLORIDE 0.9% FLUSH
10.0000 mL | INTRAVENOUS | Status: DC | PRN
Start: 2023-07-25 — End: 2023-08-05

## 2023-07-25 MED ORDER — FLUTICASONE PROPIONATE (INHAL) 100 MCG/ACT IN AEPB: 2.0000 | INHALATION_SPRAY | Freq: Two times a day (BID) | RESPIRATORY_TRACT | Status: DC

## 2023-07-25 MED ORDER — SODIUM CHLORIDE 0.9% FLUSH
10.0000 mL | Freq: Two times a day (BID) | INTRAVENOUS | Status: DC
  Administered 2023-07-25 – 2023-08-04 (×20): 10 mL

## 2023-07-25 NOTE — Progress Notes (Signed)
PROGRESS NOTE   [name]  ZOX:[id number] DOB: [birth year] DOA: 07/24/2023 PCP: Merlyn Albert, MD (Inactive)   Chief Complaint  Patient presents with   Altered Mental Status   Level of care: Stepdown  Brief Admission History:  83 y.o. female, with a history of paroxysmal atrial fibrillation, diabetes mellitus type 2, hypertension, dysphagia, pulmonary embolus, hypothyroidism, and esophageal dysmotility, dementia, living at SNF for last 5 years, patient with advanced dementia at baseline, bedbound as discussed with family. -Patient was brought by ED secondary to altered mental status, increased lethargy, patient was noted to be altered since yesterday, apparently she is on antibiotics for UTI, as discussed with family, she is with advanced dementia, but usually communicative, remember some family members, bedbound at baseline.   -In ED she was altered, CT head with no acute findings, significant for chronic infarcts, CT abdomen pelvics with questionable right lower quadrant gas-fluid collection, likely within the bowels, but due to emotional artifact could not rule out abscess or perforated fluid collection, her respiratory panel significant for positive influenza A, [facility] consulted to admit   Assessment and Plan:  Acute metabolic encephalopathy -Most likely in setting of infectious process, including influenza -CT head with no acute findings -Per reports patient was treated for UTI at facility, UA is pending   Influenza infection -Started on Tamiflu   A-fib with RVR -On Xarelto for anticoagulation, but I will hold this evening pending general surgery and evaluation, please see discussion below -currently off Cardizem drip   Questionable right lower quadrant fluid collection, likely just just fluid-gas most likely within the bowel -CT abdomen showing fluid and gas in the right lower quadrant of the abdomen, possibly within bowel, been difficult to confirm due to  motion artifact , mentation to exclude possibility of abscess or contained perforation -Consulted general surgery - see consult notes  -advanced to clears diet per surgeon -ok to discontinue Zosyn per surgeon -Hold apixaban per surgery for next 1-2 days   History of PE -Resume apixaban once cleared by general surgery   Dementia Deconditioning -Bedbound at baseline -Continue with supportive care   Hyperlipidemia -continue with statin   Hypothyroidism -Continue with Synthroid   Hypertension -Continue with Cardizem CD when able to take oral, meanwhile she is on Cardizem drip   Right forearm fluid collection likely hematoma -Is not warm or tender, likely related to hematoma from recent fall, ED physician attempted to aspirate only small amount of blood came out   DVT prophylaxis: SCDs Code Status: DNR  Family Communication: t/c to brother 1/31 no answer  Disposition: TBD    Consultants:  Surgery  Procedures:   Antimicrobials:    Subjective: Pt with advanced dementia. C/o abdominal pain.  Objective: Vitals:   07/25/23 1048 07/25/23 1100 07/25/23 1231 07/25/23 1400  BP:  99/79 101/70 (!) 100/45  Pulse:  100 80   Resp:  17 20 17   Temp:    97.7 F (36.5 C)  TempSrc:    Oral  SpO2: 98% 97% 97% 100%  Weight:    90.3 kg  Height:    5\' 6"  (1.676 m)   No intake or output data in the 24 hours ending 07/25/23 1433 Filed Weights   07/24/23 1350 07/25/23 1400  Weight: 117.9 kg 90.3 kg   Examination:  General exam: uncomfortable, with advanced dementia.   Respiratory system: Clear to auscultation. Respiratory effort normal. Cardiovascular system: normal S1 & S2 heard. No JVD, murmurs, rubs, gallops or clicks. No pedal  edema. Gastrointestinal system: Abdomen is mildly distended, soft and generalized tenderness to palpation.  No organomegaly or masses felt. Normal bowel sounds heard. Central nervous system: Alert and oriented. No focal neurological deficits. Extremities:  large right forearm hematoma. Symmetric 5 x 5 power. Skin: No rashes, lesions or ulcers. Psychiatry: Judgement and insight appear diminished with severe dementia. Mood & affect appropriate.   Data Reviewed: I have personally reviewed following labs and imaging studies  CBC: Recent Labs  Lab 07/24/23 1323 07/25/23 0403  WBC 3.8* 4.6  NEUTROABS 2.7  --   HGB 12.7 11.7*  HCT 42.5 40.7  MCV 90.6 93.6  PLT 225 210    Basic Metabolic Panel: Recent Labs  Lab 07/24/23 1323 07/25/23 0403  NA 141 142  K 3.7 4.3  CL 105 107  CO2 27 25  GLUCOSE 87 67*  BUN 11 11  CREATININE 0.67 0.55  CALCIUM 8.6* 8.5*    CBG: Recent Labs  Lab 07/25/23 0647  GLUCAP 159*    Recent Results (from the past 240 hours)  Blood Culture (routine x 2)     Status: None (Preliminary result)   Collection Time: 07/24/23  1:23 PM   Specimen: BLOOD  Result Value Ref Range Status   Specimen Description BLOOD LEFT ANTECUBITAL  Final   Special Requests   Final    BOTTLES DRAWN AEROBIC ONLY Blood Culture adequate volume   Culture   Final    NO GROWTH < 24 HOURS Performed at [facility], [address], [place], [place] [address]    Report Status PENDING  Incomplete  Blood Culture (routine x 2)     Status: None (Preliminary result)   Collection Time: 07/24/23  1:29 PM   Specimen: BLOOD  Result Value Ref Range Status   Specimen Description BLOOD BLOOD LEFT FOREARM  Final   Special Requests   Final    BOTTLES DRAWN AEROBIC AND ANAEROBIC Blood Culture results may not be optimal due to an inadequate volume of blood received in culture bottles   Culture   Final    NO GROWTH < 24 HOURS Performed at [facility], [address] [place], [place] [address]    Report Status PENDING  Incomplete  Resp panel by RT-PCR (RSV, Flu A&B, Covid) Anterior Nasal Swab     Status: Abnormal   Collection Time: 07/24/23  2:42 PM   Specimen: Anterior Nasal Swab  Result Value Ref Range Status   SARS Coronavirus 2 by RT PCR  NEGATIVE NEGATIVE Final    Comment: (NOTE) SARS-CoV-2 target nucleic acids are NOT DETECTED.  The SARS-CoV-2 RNA is generally detectable in upper respiratory specimens during the acute phase of infection. The lowest concentration of SARS-CoV-2 viral copies this assay can detect is 138 copies/mL. A negative result does not preclude SARS-Cov-2 infection and should not be used as the sole basis for treatment or other patient management decisions. A negative result may occur with  improper specimen collection/handling, submission of specimen other than nasopharyngeal swab, presence of viral mutation(s) within the areas targeted by this assay, and inadequate number of viral copies(<138 copies/mL). A negative result must be combined with clinical observations, patient history, and epidemiological information. The expected result is Negative.  Fact Sheet for Patients:  BloggerCourse.com  Fact Sheet for Healthcare Providers:  SeriousBroker.it  This test is no t yet approved or cleared by the [place] FDA and  has been authorized for detection and/or diagnosis of SARS-CoV-2 by FDA under an Emergency Use Authorization (  EUA). This EUA will remain  in effect (meaning this test can be used) for the duration of the COVID-19 declaration under Section 564(b)(1) of the Act, 21 U.S.C.section 360bbb-3(b)(1), unless the authorization is terminated  or revoked sooner.       Influenza A by PCR POSITIVE (A) NEGATIVE Final   Influenza B by PCR NEGATIVE NEGATIVE Final    Comment: (NOTE) The Xpert Xpress SARS-CoV-2/FLU/RSV plus assay is intended as an aid in the diagnosis of influenza from Nasopharyngeal swab specimens and should not be used as a sole basis for treatment. Nasal washings and aspirates are unacceptable for Xpert Xpress SARS-CoV-2/FLU/RSV testing.  Fact Sheet for Patients: BloggerCourse.com  Fact Sheet for  Healthcare Providers: SeriousBroker.it  This test is not yet approved or cleared by the [place] FDA and has been authorized for detection and/or diagnosis of SARS-CoV-2 by FDA under an Emergency Use Authorization (EUA). This EUA will remain in effect (meaning this test can be used) for the duration of the COVID-19 declaration under Section 564(b)(1) of the Act, 21 U.S.C. section 360bbb-3(b)(1), unless the authorization is terminated or revoked.     Resp Syncytial Virus by PCR NEGATIVE NEGATIVE Final    Comment: (NOTE) Fact Sheet for Patients: BloggerCourse.com  Fact Sheet for Healthcare Providers: SeriousBroker.it  This test is not yet approved or cleared by the [place] FDA and has been authorized for detection and/or diagnosis of SARS-CoV-2 by FDA under an Emergency Use Authorization (EUA). This EUA will remain in effect (meaning this test can be used) for the duration of the COVID-19 declaration under Section 564(b)(1) of the Act, 21 U.S.C. section 360bbb-3(b)(1), unless the authorization is terminated or revoked.  Performed at [facility], [address] [place], [place] [address]      Radiology Studies: DG Chest Portable 1 View Result Date: 07/25/2023 CLINICAL DATA:  PICC line placement EXAM: PORTABLE CHEST 1 VIEW COMPARISON:  Chest radiograph and CT chest, abdomen, and pelvis dated 07/24/2023. FINDINGS: Right PICC line tip overlies the lower SVC. Low lung volumes. Stable cardiomegaly. Aortic atherosclerosis. Similar indistinctness of the left costophrenic angle, likely correlates with prominent subpleural fat noted on the prior CT. No focal consolidation, sizeable pleural effusion, or pneumothorax. The visualized osseous structures are unchanged. IMPRESSION: 1. Right PICC line tip overlies the lower SVC. 2. Low lung volumes.  No acute cardiopulmonary findings. Electronically Signed   By:  Hart Robinsons M.D.   On: 07/25/2023 13:11   [place] EKG SITE RITE Result Date: 07/25/2023 If Site Rite image not attached, placement could not be confirmed due to current cardiac rhythm.  CT CHEST ABDOMEN PELVIS W CONTRAST Result Date: 07/24/2023 CLINICAL DATA:  Sepsis. Urinary tract infection. Previous appendectomy, hysterectomy, oophorectomy and cholecystectomy. EXAM: CT CHEST, ABDOMEN, AND PELVIS WITH CONTRAST TECHNIQUE: Multidetector CT imaging of the chest, abdomen and pelvis was performed following the standard protocol during bolus administration of intravenous contrast. RADIATION DOSE REDUCTION: This exam was performed according to the departmental dose-optimization program which includes automated exposure control, adjustment of the mA and/or kV according to patient size and/or use of iterative reconstruction technique. CONTRAST:  OMNIPAQUE IOHEXOL 300 MG/ML  SOLN COMPARISON:  Abdomen and pelvis CT dated 04/24/2018. Portable chest obtained earlier today. Chest CT report dated 05/15/2019. Chest CTA dated 10/18/2008. FINDINGS: CT CHEST FINDINGS Cardiovascular: Stable mildly enlarged heart aortic valve calcifications and dense mitral valve annulus calcifications. Atheromatous calcifications, including the coronary arteries and aorta. The central pulmonary arteries remain mildly dilated with a main pulmonary  artery diameter of 3.4 cm. Mediastinum/Nodes: No enlarged mediastinal, hilar, or axillary lymph nodes. Interval flattening of the trachea and mainstem bronchi with an interval decreased depth of inspiration. Unremarkable thyroid gland and esophagus. Lungs/Pleura: Decreased depth of inspiration. Mild bilateral lower lobe cylindrical bronchiectasis and associated mild parenchymal densities. Prominent subpleural fat on the left causing the blunting of the lateral costophrenic angle seen radiographically. No pleural fluid Musculoskeletal: Thoracic spine degenerative changes multiple thoracic vertebral  compression deformities with mild bony retropulsion and no visible acute fracture lines. Associated acute kyphosis in the upper thoracic spine. CT ABDOMEN PELVIS FINDINGS Hepatobiliary: No focal liver abnormality is seen. Status post cholecystectomy. No biliary dilatation. Pancreas: Unremarkable. No pancreatic ductal dilatation or surrounding inflammatory changes. Spleen: Normal in size without focal abnormality. Adrenals/Urinary Tract: Normal-appearing adrenal glands multiple bilateral simple appearing renal cysts. These do not need imaging follow-up unremarkable urinary bladder and ureters Stomach/Bowel: Large number of sigmoid colon diverticula without evidence of diverticulitis. Breathing motion blurring obscuring the detail in the right lower quadrant of the abdomen. There is some fluid in gas in that region of the abdomen that is most likely intraluminal. However, due to the motion artifact, it is difficult to exclude a fluid or gas collection in that area. Surgically absent appendix by history. Unremarkable stomach. Vascular/Lymphatic: Atheromatous arterial calcifications without aneurysm. No enlarged lymph nodes. Reproductive: Status post hysterectomy. No adnexal masses. Other: Anterior subcutaneous edema and anterior subcutaneous air, compatible with recent subcutaneous injection. Moderate-sized left inguinal hernia containing fat and small to moderate-sized right inguinal hernia containing fat Musculoskeletal: Lumbar spine degenerative changes. Multiple lumbar spine vertebral compression deformities that are new since 04/24/2018. Stable old T12 vertebral compression deformity. Possible acute fracture line in the anterior, superior corner of the L2 vertebral body and possible acute fracture line in the superior endplate of the L4 vertebral body. IMPRESSION: 1. Fluid and gas in the right lower quadrant of the abdomen which is most likely within the bowel. However, this can not be confirmed as intraluminal  due to motion artifacts in that area. Correlation with the presence or absence of focal pain and tenderness in that area is recommended to help exclude the possibility of an abscess or contained perforation in the right lower quadrant of the abdomen. 2. Otherwise, no acute abnormality seen. 3. Decreased depth of inspiration with flattening of the trachea and mainstem bronchi, compatible with tracheobronchomalacia. 4. Mild bilateral lower lobe cylindrical bronchiectasis and associated mild parenchymal densities, most likely due to atelectasis. 5. Stable mild cardiomegaly. 6. Mildly dilated central pulmonary arteries, suggesting pulmonary arterial hypertension. 7. Multiple thoracic and lumbar spine vertebral compression deformities, some of which are new since 04/24/2018. Possible acute fracture lines in the anterior, superior corner of the L2 vertebral body and superior endplate of the L4 vertebral body. 8. Sigmoid colon diverticulosis. 9. Moderate-sized left inguinal hernia containing fat and small to moderate-sized right inguinal hernia containing fat. 10. Aortic atherosclerosis. Aortic Atherosclerosis (ICD10-I70.0). Electronically Signed   By: Beckie Salts M.D.   On: 07/24/2023 16:45   CT Head Wo Contrast Result Date: 07/24/2023 CLINICAL DATA:  Mental status change. Current therapy for urinary tract infection. EXAM: CT HEAD WITHOUT CONTRAST TECHNIQUE: Contiguous axial images were obtained from the base of the skull through the vertex without intravenous contrast. RADIATION DOSE REDUCTION: This exam was performed according to the departmental dose-optimization program which includes automated exposure control, adjustment of the mA and/or kV according to patient size and/or use of iterative reconstruction technique. COMPARISON:  01/05/2023  FINDINGS: Brain: No evidence of acute infarction, hemorrhage, hydrocephalus, extra-axial collection or mass lesion/mass effect. Vascular: Encephalomalacia within the right  occipital and posterior temporal lobes consistent with remote PCA infarct. Ex vacuo dilatation of the right posterolateral ventricle noted. There is mild diffuse low-attenuation within the subcortical and periventricular white matter compatible with chronic microvascular disease. Skull: Normal. Negative for fracture or focal lesion. Sinuses/Orbits: Air-fluid level within the sphenoid sinus. Partial opacification of the ethmoid air cells. Other: None. IMPRESSION: 1. No acute intracranial abnormalities. 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 4. Air-fluid level within the sphenoid sinus. Correlate for any clinical signs or symptoms of acute sinusitis. Electronically Signed   By: Signa Kell M.D.   On: 07/24/2023 16:15   DG Chest Port 1 View Result Date: 07/24/2023 CLINICAL DATA:  Questionable sepsis - evaluate for abnormality. Weakness. Altered mental status. EXAM: PORTABLE CHEST 1 VIEW COMPARISON:  01/05/2023. FINDINGS: Redemonstration of blunting of left lateral costophrenic angle, similar to the prior study, which may represent small left pleural effusion. There are probable associated atelectatic changes at the left lung base. Bilateral lung fields are otherwise clear. No acute consolidation or lung collapse. Right lateral costophrenic angle is clear. Stable cardio-mediastinal silhouette. No acute osseous abnormalities. The soft tissues are within normal limits. There are surgical clips in the right upper quadrant, typical of a previous cholecystectomy. IMPRESSION: *Persistent blunting of left lateral costophrenic angle, which may represent small left pleural effusion. *Otherwise no acute cardiopulmonary abnormality seen. Electronically Signed   By: Jules Schick M.D.   On: 07/24/2023 14:16    Scheduled Meds:  bisacodyl  10 mg Rectal Daily   budesonide (PULMICORT) nebulizer solution  0.25 mg Nebulization BID   buPROPion  150 mg Oral Daily   Chlorhexidine Gluconate Cloth  6 each  Topical Daily   diltiazem  120 mg Oral Daily   escitalopram  20 mg Oral Daily   hydrOXYzine  25 mg Oral BID   levothyroxine  50 mcg Oral QAC breakfast   magnesium oxide  400 mg Oral Daily   montelukast  10 mg Oral Daily   oseltamivir  75 mg Oral BID   pantoprazole  40 mg Oral Daily   rosuvastatin  20 mg Oral Daily   sodium chloride flush  10-40 mL Intracatheter Q12H   sodium chloride flush  10-40 mL Intracatheter Q12H   Continuous Infusions:  diltiazem (CARDIZEM) infusion Stopped (07/25/23 0933)   lactated ringers       LOS: 1 day   Critical Care Procedure Note Authorized and Performed by: Maryln Manuel MD  Total Critical Care time:  57 mins Due to a high probability of clinically significant, life threatening deterioration, the patient required my highest level of preparedness to intervene emergently and I personally spent this critical care time directly and personally managing the patient.  This critical care time included obtaining a history; examining the patient, pulse oximetry; ordering and review of studies; arranging urgent treatment with development of a management plan; evaluation of patient's response of treatment; frequent reassessment; and discussions with other providers.  This critical care time was performed to assess and manage the high probability of imminent and life threatening deterioration that could result in multi-organ failure.  It was exclusive of separately billable procedures and treating other patients and teaching time.    Standley Dakins, MD How to contact the [facility] Attending or Consulting provider 7A - 7P or covering provider during after hours 7P -7A, for this patient?  Check the care team in [facility] and look for a) attending/consulting [facility] provider listed and b) the [facility] team listed Log into [email or web address] to find provider on call.  Locate the [facility] provider you are looking for under [facility] and page to a number that you can be directly reached. If you  still have difficulty reaching the provider, please page the [facility] (Director on Call) for the Hospitalists listed on amion for assistance.  07/25/2023, 2:33 PM

## 2023-07-25 NOTE — Hospital Course (Signed)
83 y.o. female, with a history of paroxysmal atrial fibrillation, diabetes mellitus type 2, hypertension, dysphagia, pulmonary embolus, hypothyroidism, and esophageal dysmotility, dementia, living at SNF for last 5 years, patient with advanced dementia at baseline, bedbound as discussed with family. -Patient was brought by ED secondary to altered mental status, increased lethargy, patient was noted to be altered since yesterday, apparently she is on antibiotics for UTI, as discussed with family, she is with advanced dementia, but usually communicative, remember some family members, bedbound at baseline.   -In ED she was altered, CT head with no acute findings, significant for chronic infarcts, CT abdomen pelvics with questionable right lower quadrant gas-fluid collection, likely within the bowels, but due to emotional artifact could not rule out abscess or perforated fluid collection, her respiratory panel significant for positive influenza A, [facility] hospitalist consulted to admit

## 2023-07-25 NOTE — Progress Notes (Signed)
Peripherally Inserted Central Catheter Placement  The IV Nurse has discussed with the patient and/or persons authorized to consent for the patient, the purpose of this procedure and the potential benefits and risks involved with this procedure.  The benefits include less needle sticks, lab draws from the catheter, and the patient may be discharged home with the catheter. Risks include, but not limited to, infection, bleeding, blood clot (thrombus formation), and puncture of an artery; nerve damage and irregular heartbeat and possibility to perform a PICC exchange if needed/ordered by physician.  Alternatives to this procedure were also discussed.  Bard Power PICC patient education guide, fact sheet on infection prevention and patient information card has been provided to patient /or left at bedside. Pt disoriented. Brother at bedside who is POA signed consent for PICC placement.   PICC Placement Documentation  PICC Triple Lumen 07/25/23 Right Basilic 45 cm 1 cm (Active)  Indication for Insertion or Continuance of Line Limited venous access - need for IV therapy >5 days (PICC only) 07/25/23 1240  Exposed Catheter (cm) 1 cm 07/25/23 1240  Site Assessment Clean, Dry, Intact;Bruised 07/25/23 1240  Lumen #1 Status Flushed;Saline locked;Blood return noted 07/25/23 1240  Lumen #2 Status Flushed;Saline locked;Blood return noted 07/25/23 1240  Lumen #3 Status Flushed;Saline locked;Blood return noted 07/25/23 1240  Dressing Type Transparent;Securing device 07/25/23 1240  Dressing Status Antimicrobial disc/dressing in place 07/25/23 1240  Dressing Change Due 08/01/23 07/25/23 1240       Sakia Schrimpf Westley Hummer 07/25/2023, 1:29 PM

## 2023-07-25 NOTE — TOC Initial Note (Signed)
Transition of Care [facility]) - Initial/Assessment Note    Patient Details  Name: [name] MRN: [id number] Date of Birth: [birth year]  Transition of Care [facility]) CM/SW Contact:    Villa Herb, LCSWA Phone Number: 07/25/2023, 12:55 PM  Clinical Narrative:                 Pt is high risk for readmission. Per chart review CSW notes pt arrived to hospital from [facility]. CSW attempted to reach brother, unable to make contact. CSW left VM for [name] with [facility]. Per MD note pt has been a resident with [facility] for the last 5 years. Plan will be for return. TOC to follow.   Expected Discharge Plan: Long Term Nursing Home Barriers to Discharge: Continued Medical Work up   Patient Goals and CMS Choice Patient states their goals for this hospitalization and ongoing recovery are:: return to [facility] CMS Medicare.gov Compare Post Acute Care list provided to:: Patient Choice offered to / list presented to : Patient      Expected Discharge Plan and Services In-house Referral: Clinical Social Work Discharge Planning Services: CM Consult   Living arrangements for the past 2 months: Skilled Nursing Facility                                      Prior Living Arrangements/Services Living arrangements for the past 2 months: Skilled Nursing Facility Lives with:: Facility Resident Patient language and need for interpreter reviewed:: Yes Do you feel safe going back to the place where you live?: Yes      Need for Family Participation in Patient Care: Yes (Comment) Care giver support system in place?: Yes (comment)   Criminal Activity/Legal Involvement Pertinent to Current Situation/Hospitalization: No - Comment as needed  Activities of Daily Living      Permission Sought/Granted                  Emotional Assessment Appearance:: Appears stated age       Alcohol / Substance Use: Not Applicable Psych Involvement: No (comment)  Admission diagnosis:  Atrial fibrillation  with RVR (HCC) [I48.91] Patient Active Problem List   Diagnosis Date Noted   Abnormal CT of the abdomen 07/25/2023   Altered mental status 07/25/2023   Acute encephalopathy 07/25/2023   Influenza A 07/25/2023   Atrial fibrillation with RVR (HCC) 07/24/2023   Gastritis and gastroduodenitis    Esophageal stricture    Odynophagia    Tonsillar abscess 08/13/2018   AF (paroxysmal atrial fibrillation) (HCC) 08/13/2018   Nausea vomiting and diarrhea 05/11/2016   Obesity 05/11/2016   CKD (chronic kidney disease), 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   Insomnia 04/15/2016   Hyperkalemia 04/08/2016   Volume depletion 04/08/2016   AKI (acute kidney injury) (HCC) 04/08/2016   Depression 04/07/2016   Nausea without vomiting 05/09/2014   Melena 05/08/2014   UGI bleed 05/08/2014   Dysphagia 10/26/2013   Difficulty walking 10/19/2012   Weakness of left leg 10/19/2012   Dysphagia 05/04/2012   Microcytic anemia 08/10/2011   Acute respiratory failure (HCC) 08/09/2011   IBS (irritable bowel syndrome) 08/09/2011   DOE (dyspnea on exertion) 03/05/2011   Anemia 03/05/2011   Peripheral neuropathy 10/02/2010   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 [place] [address] Phone: [phone] Fax: [phone]     Social Drivers of Health (SDOH) Social History: SDOH Screenings   Tobacco Use: Medium Risk (07/24/2023)   SDOH Interventions:     Readmission Risk Interventions    07/25/2023   12:54 PM   Readmission Risk Prevention Plan  Transportation Screening Complete  HRI or Home Care Consult Complete  Social Work Consult for Recovery Care Planning/Counseling Complete  Palliative Care Screening Not Applicable  Medication Review Oceanographer) Complete

## 2023-07-25 NOTE — Consult Note (Addendum)
[facility] Consult  Reason for Consult: Question right lower quadrant fluid collection versus motion artifact on CT Referring Physician: Dr. Estell Harpin  Chief Complaint   Altered Mental Status     HPI: [name] is a 83 y.o. female who presented to the hospital with altered mentation and lethargy.  She was recently being treated for UTI with antibiotics.  Her past medical history significant for A-fib normally on Xarelto, diabetes, hypertension, PE, hypothyroidism, and advanced dementia and bedbound at baseline.  Her surgical history is significant for an appendectomy, hysterectomy, and cholecystectomy.  Patient is confused and not answering questions appropriately on exam, so history was obtained through EMR.  She has been intermittently tachycardic and tachypenic since admission.  She was noted to be flu positive.  A CT of the chest, abdomen, and pelvis was obtained in the emergency department.  This demonstrated a fluid and gas area in the right lower quadrant which is most likely within the bowel lumen but cannot be confirmed secondary to motion artifact.  She has no leukocytosis and a normal lactic acid.  Past Medical History:  Diagnosis Date   Allergy    Arthritis    Asthma    CAD (coronary artery disease)    BMS and DES to LAD 2004; DES to D2 2005   Chronic pain of left knee    Depression    Diverticulitis of intestine    Essential hypertension    FH: colonic polyps    Adenomataous   GERD (gastroesophageal reflux disease)    History of kidney stones    Hyperlipidemia    Hypothyroidism    Insomnia    Irritable bowel syndrome    NSTEMI (non-ST elevated myocardial infarction) (HCC)    2005   Paroxysmal atrial fibrillation (HCC)    Pulmonary embolism (HCC)    2000 and 2010   Sleep apnea    Type 2 diabetes mellitus (HCC)    Venous stasis     Past Surgical History:  Procedure Laterality Date   ABDOMINAL HYSTERECTOMY     APPENDECTOMY     BALLOON  DILATION N/A 11/11/2013   Procedure: BALLOON DILATION;  Surgeon: Malissa Hippo, MD;  [facility];  Service: Endoscopy;  Laterality: N/A;   BREAST REDUCTION SURGERY     BREAST SURGERY     CHOLECYSTECTOMY     CLEFT PALATE REPAIR     4 surgeries   COLONOSCOPY  2011   negative   COLONOSCOPY N/A 05/10/2014   Procedure: COLONOSCOPY;  Surgeon: Malissa Hippo, MD;  [facility];  Service: Endoscopy;  Laterality: N/A;   ESOPHAGEAL DILATION N/A 03/08/2016   Procedure: ESOPHAGEAL DILATION;  Surgeon: Malissa Hippo, MD;  [facility];  Service: Endoscopy;  Laterality: N/A;   ESOPHAGEAL DILATION N/A 05/16/2019   Procedure: ESOPHAGEAL DILATION;  Surgeon: West Bali, MD;  [facility];  Service: Endoscopy;  Laterality: N/A;   ESOPHAGOGASTRODUODENOSCOPY N/A 11/11/2013   Procedure: ESOPHAGOGASTRODUODENOSCOPY (EGD);  Surgeon: Malissa Hippo, MD;  [facility];  Service: Endoscopy;  Laterality: N/A;  200-moved to 100 Ann notified pt   ESOPHAGOGASTRODUODENOSCOPY N/A 05/09/2014   Procedure: ESOPHAGOGASTRODUODENOSCOPY (EGD);  Surgeon: Malissa Hippo, MD;  [facility];  Service: Endoscopy;  Laterality: N/A;   ESOPHAGOGASTRODUODENOSCOPY N/A 03/08/2016   Procedure: ESOPHAGOGASTRODUODENOSCOPY (EGD);  Surgeon: Malissa Hippo, MD;  [facility];  Service: Endoscopy;  Laterality: N/A;  2:20   ESOPHAGOGASTRODUODENOSCOPY N/A  05/16/2019   Procedure: ESOPHAGOGASTRODUODENOSCOPY (EGD);  Surgeon: West Bali, MD;  [facility];  Service: Endoscopy;  Laterality: N/A;   HEEL SPUR SURGERY     KNEE SURGERY     MALONEY DILATION N/A 11/11/2013   Procedure: MALONEY DILATION;  Surgeon: Malissa Hippo, MD;  [facility];  Service: Endoscopy;  Laterality: N/A;   OOPHORECTOMY     SAVORY DILATION N/A 11/11/2013   Procedure: SAVORY DILATION;  Surgeon: Malissa Hippo, MD;  [facility];  Service: Endoscopy;   Laterality: N/A;   TONSILLECTOMY      Family History  Problem Relation Age of Onset   Heart attack Mother    Diabetes Mother    Hyperlipidemia Mother    Heart attack Father    Lung cancer Sister    Lymphoma Brother    Colon cancer Neg Hx     Social History   Tobacco Use   Smoking status: Former    Current packs/day: 2.00    Average packs/day: 2.0 packs/day for 36.2 years (72.4 ttl pk-yrs)    Types: Cigarettes    Start date: 05/05/1987   Smokeless tobacco: Never  Vaping Use   Vaping status: Never Used  Substance Use Topics   Alcohol use: No    Alcohol/week: 0.0 standard drinks of alcohol   Drug use: No    Medications: I have reviewed the patient's current medications.  Allergies  Allergen Reactions   Cefprozil Nausea And Vomiting    Unknown   Demerol Nausea And Vomiting   Meperidine Hcl Other (See Comments)    Unknown     ROS:  Unable to obtain secondary to patient mentation  Blood pressure 99/79, pulse 100, temperature 98.3 F (36.8 C), temperature source Oral, resp. rate 17, weight 117.9 kg, SpO2 97%. Physical Exam Vitals reviewed.  Constitutional:      Appearance: She is obese.     Comments: Awake and not responding to questions appropriately  HENT:     Head: Normocephalic and atraumatic.  Eyes:     Extraocular Movements: Extraocular movements intact.     Pupils: Pupils are equal, round, and reactive to light.  Cardiovascular:     Rate and Rhythm: Tachycardia present.  Pulmonary:     Effort: Pulmonary effort is normal.  Abdominal:     Comments: Abdomen soft, nondistended, no percussion tenderness, mild diffuse tenderness with deep palpation; no rigidity, guarding, rebound tenderness  Skin:    General: Skin is warm and dry.     Results: Results for orders placed or performed during the hospital encounter of 07/24/23 (from the past 48 hours)  Lactic acid, plasma     Status: None   Collection Time: 07/24/23  1:23 PM  Result Value Ref Range    Lactic Acid, Venous 1.3 0.5 - 1.9 mmol/L    Comment: Performed at [facility], [address], [place], [place] [address]  Comprehensive metabolic panel     Status: Abnormal   Collection Time: 07/24/23  1:23 PM  Result Value Ref Range   Sodium 141 135 - 145 mmol/L   Potassium 3.7 3.5 - 5.1 mmol/L   Chloride 105 98 - 111 mmol/L   CO2 27 22 - 32 mmol/L   Glucose, Bld 87 70 - 99 mg/dL    Comment: Glucose reference range applies only to samples taken after fasting for at least 8 hours.   BUN 11 8 - 23 mg/dL   Creatinine, Ser 9.56 0.44 -  1.00 mg/dL   Calcium 8.6 (L) 8.9 - 10.3 mg/dL   Total Protein 6.8 6.5 - 8.1 g/dL   Albumin 2.8 (L) 3.5 - 5.0 g/dL   AST 23 15 - 41 U/L   ALT 10 0 - 44 U/L   Alkaline Phosphatase 84 38 - 126 U/L   Total Bilirubin 0.6 0.0 - 1.2 mg/dL   GFR, Estimated >53 >66 mL/min    Comment: (NOTE) Calculated using the CKD-EPI Creatinine Equation (2021)    Anion gap 9 5 - 15    Comment: Performed at [facility], [address], [place], [place] [address]  CBC with Differential     Status: Abnormal   Collection Time: 07/24/23  1:23 PM  Result Value Ref Range   WBC 3.8 (L) 4.0 - 10.5 K/uL   RBC 4.69 3.87 - 5.11 MIL/uL   Hemoglobin 12.7 12.0 - 15.0 g/dL   HCT 74.2 59.5 - 63.8 %   MCV 90.6 80.0 - 100.0 fL   MCH 27.1 26.0 - 34.0 pg   MCHC 29.9 (L) 30.0 - 36.0 g/dL   RDW 75.6 (H) 43.3 - 29.5 %   Platelets 225 150 - 400 K/uL   nRBC 0.0 0.0 - 0.2 %   Neutrophils Relative % 72 %   Neutro Abs 2.7 1.7 - 7.7 K/uL   Lymphocytes Relative 14 %   Lymphs Abs 0.5 (L) 0.7 - 4.0 K/uL   Monocytes Relative 13 %   Monocytes Absolute 0.5 0.1 - 1.0 K/uL   Eosinophils Relative 1 %   Eosinophils Absolute 0.0 0.0 - 0.5 K/uL   Basophils Relative 0 %   Basophils Absolute 0.0 0.0 - 0.1 K/uL   Immature Granulocytes 0 %   Abs Immature Granulocytes 0.01 0.00 - 0.07 K/uL    Comment: Performed at [facility], [address], [place], [place] [address]  Protime-INR     Status: Abnormal    Collection Time: 07/24/23  1:23 PM  Result Value Ref Range   Prothrombin Time 16.3 (H) 11.4 - 15.2 seconds   INR 1.3 (H) 0.8 - 1.2    Comment: (NOTE) INR goal varies based on device and disease states. Performed at [facility], [address] [place], [place] [address]   APTT     Status: Abnormal   Collection Time: 07/24/23  1:23 PM  Result Value Ref Range   aPTT 42 (H) 24 - 36 seconds    Comment:        IF BASELINE aPTT IS ELEVATED, SUGGEST PATIENT RISK ASSESSMENT BE USED TO DETERMINE APPROPRIATE ANTICOAGULANT THERAPY. Performed at [facility], [address], [place], [place] [address]   Blood Culture (routine x 2)     Status: None (Preliminary result)   Collection Time: 07/24/23  1:23 PM   Specimen: BLOOD  Result Value Ref Range   Specimen Description BLOOD LEFT ANTECUBITAL    Special Requests      BOTTLES DRAWN AEROBIC ONLY Blood Culture adequate volume   Culture      NO GROWTH < 24 HOURS Performed at [facility], [address] [place], [place] [address]    Report Status PENDING   Blood Culture (routine x 2)     Status: None (Preliminary result)   Collection Time: 07/24/23  1:29 PM   Specimen: BLOOD  Result Value Ref Range   Specimen Description BLOOD BLOOD LEFT FOREARM    Special Requests      BOTTLES DRAWN AEROBIC AND ANAEROBIC Blood Culture results may  not be optimal due to an inadequate volume of blood received in culture bottles   Culture      NO GROWTH < 24 HOURS Performed at [facility], [address], [place], [place] [address]    Report Status PENDING   Resp panel by RT-PCR (RSV, Flu A&B, Covid) Anterior Nasal Swab     Status: Abnormal   Collection Time: 07/24/23  2:42 PM   Specimen: Anterior Nasal Swab  Result Value Ref Range   SARS Coronavirus 2 by RT PCR NEGATIVE NEGATIVE    Comment: (NOTE) SARS-CoV-2 target nucleic acids are NOT DETECTED.  The SARS-CoV-2 RNA is generally detectable in upper respiratory specimens during the acute phase of  infection. The lowest concentration of SARS-CoV-2 viral copies this assay can detect is 138 copies/mL. A negative result does not preclude SARS-Cov-2 infection and should not be used as the sole basis for treatment or other patient management decisions. A negative result may occur with  improper specimen collection/handling, submission of specimen other than nasopharyngeal swab, presence of viral mutation(s) within the areas targeted by this assay, and inadequate number of viral copies(<138 copies/mL). A negative result must be combined with clinical observations, patient history, and epidemiological information. The expected result is Negative.  Fact Sheet for Patients:  BloggerCourse.com  Fact Sheet for Healthcare Providers:  SeriousBroker.it  This test is no t yet approved or cleared by the [place] FDA and  has been authorized for detection and/or diagnosis of SARS-CoV-2 by FDA under an Emergency Use Authorization (EUA). This EUA will remain  in effect (meaning this test can be used) for the duration of the COVID-19 declaration under Section 564(b)(1) of the Act, 21 U.S.C.section 360bbb-3(b)(1), unless the authorization is terminated  or revoked sooner.       Influenza A by PCR POSITIVE (A) NEGATIVE   Influenza B by PCR NEGATIVE NEGATIVE    Comment: (NOTE) The Xpert Xpress SARS-CoV-2/FLU/RSV plus assay is intended as an aid in the diagnosis of influenza from Nasopharyngeal swab specimens and should not be used as a sole basis for treatment. Nasal washings and aspirates are unacceptable for Xpert Xpress SARS-CoV-2/FLU/RSV testing.  Fact Sheet for Patients: BloggerCourse.com  Fact Sheet for Healthcare Providers: SeriousBroker.it  This test is not yet approved or cleared by the [place] FDA and has been authorized for detection and/or diagnosis of SARS-CoV-2 by FDA  under an Emergency Use Authorization (EUA). This EUA will remain in effect (meaning this test can be used) for the duration of the COVID-19 declaration under Section 564(b)(1) of the Act, 21 U.S.C. section 360bbb-3(b)(1), unless the authorization is terminated or revoked.     Resp Syncytial Virus by PCR NEGATIVE NEGATIVE    Comment: (NOTE) Fact Sheet for Patients: BloggerCourse.com  Fact Sheet for Healthcare Providers: SeriousBroker.it  This test is not yet approved or cleared by the [place] FDA and has been authorized for detection and/or diagnosis of SARS-CoV-2 by FDA under an Emergency Use Authorization (EUA). This EUA will remain in effect (meaning this test can be used) for the duration of the COVID-19 declaration under Section 564(b)(1) of the Act, 21 U.S.C. section 360bbb-3(b)(1), unless the authorization is terminated or revoked.  Performed at [facility], [address] [place], [place] [address]   Lactic acid, plasma     Status: None   Collection Time: 07/24/23  2:44 PM  Result Value Ref Range   Lactic Acid, Venous 1.4 0.5 - 1.9 mmol/L    Comment: Performed at [employer]  [facility], [address] [place], [place] [address]  Basic metabolic panel     Status: Abnormal   Collection Time: 07/25/23  4:03 AM  Result Value Ref Range   Sodium 142 135 - 145 mmol/L   Potassium 4.3 3.5 - 5.1 mmol/L   Chloride 107 98 - 111 mmol/L   CO2 25 22 - 32 mmol/L   Glucose, Bld 67 (L) 70 - 99 mg/dL    Comment: Glucose reference range applies only to samples taken after fasting for at least 8 hours.   BUN 11 8 - 23 mg/dL   Creatinine, Ser 1.91 0.44 - 1.00 mg/dL   Calcium 8.5 (L) 8.9 - 10.3 mg/dL   GFR, Estimated >47 >82 mL/min    Comment: (NOTE) Calculated using the CKD-EPI Creatinine Equation (2021)    Anion gap 10 5 - 15    Comment: Performed at [facility], [address], [place], [place] [address]  CBC     Status: Abnormal    Collection Time: 07/25/23  4:03 AM  Result Value Ref Range   WBC 4.6 4.0 - 10.5 K/uL   RBC 4.35 3.87 - 5.11 MIL/uL   Hemoglobin 11.7 (L) 12.0 - 15.0 g/dL   HCT 30.8 65.7 - 84.6 %   MCV 93.6 80.0 - 100.0 fL   MCH 26.9 26.0 - 34.0 pg   MCHC 28.7 (L) 30.0 - 36.0 g/dL   RDW 96.2 (H) 95.2 - 84.1 %   Platelets 210 150 - 400 K/uL   nRBC 0.0 0.0 - 0.2 %    Comment: Performed at [facility], [address], [place], [place] [address]  CBG monitoring, ED     Status: Abnormal   Collection Time: 07/25/23  6:47 AM  Result Value Ref Range   Glucose-Capillary 159 (H) 70 - 99 mg/dL    Comment: Glucose reference range applies only to samples taken after fasting for at least 8 hours.    [place] EKG SITE RITE Result Date: 07/25/2023 If Site Rite image not attached, placement could not be confirmed due to current cardiac rhythm.  CT CHEST ABDOMEN PELVIS W CONTRAST Result Date: 07/24/2023 CLINICAL DATA:  Sepsis. Urinary tract infection. Previous appendectomy, hysterectomy, oophorectomy and cholecystectomy. EXAM: CT CHEST, ABDOMEN, AND PELVIS WITH CONTRAST TECHNIQUE: Multidetector CT imaging of the chest, abdomen and pelvis was performed following the standard protocol during bolus administration of intravenous contrast. RADIATION DOSE REDUCTION: This exam was performed according to the departmental dose-optimization program which includes automated exposure control, adjustment of the mA and/or kV according to patient size and/or use of iterative reconstruction technique. CONTRAST:  OMNIPAQUE IOHEXOL 300 MG/ML  SOLN COMPARISON:  Abdomen and pelvis CT dated 04/24/2018. Portable chest obtained earlier today. Chest CT report dated 05/15/2019. Chest CTA dated 10/18/2008. FINDINGS: CT CHEST FINDINGS Cardiovascular: Stable mildly enlarged heart aortic valve calcifications and dense mitral valve annulus calcifications. Atheromatous calcifications, including the coronary arteries and aorta. The central pulmonary arteries  remain mildly dilated with a main pulmonary artery diameter of 3.4 cm. Mediastinum/Nodes: No enlarged mediastinal, hilar, or axillary lymph nodes. Interval flattening of the trachea and mainstem bronchi with an interval decreased depth of inspiration. Unremarkable thyroid gland and esophagus. Lungs/Pleura: Decreased depth of inspiration. Mild bilateral lower lobe cylindrical bronchiectasis and associated mild parenchymal densities. Prominent subpleural fat on the left causing the blunting of the lateral costophrenic angle seen radiographically. No pleural fluid Musculoskeletal: Thoracic spine degenerative changes multiple thoracic vertebral compression deformities with mild bony retropulsion and no visible  acute fracture lines. Associated acute kyphosis in the upper thoracic spine. CT ABDOMEN PELVIS FINDINGS Hepatobiliary: No focal liver abnormality is seen. Status post cholecystectomy. No biliary dilatation. Pancreas: Unremarkable. No pancreatic ductal dilatation or surrounding inflammatory changes. Spleen: Normal in size without focal abnormality. Adrenals/Urinary Tract: Normal-appearing adrenal glands multiple bilateral simple appearing renal cysts. These do not need imaging follow-up unremarkable urinary bladder and ureters Stomach/Bowel: Large number of sigmoid colon diverticula without evidence of diverticulitis. Breathing motion blurring obscuring the detail in the right lower quadrant of the abdomen. There is some fluid in gas in that region of the abdomen that is most likely intraluminal. However, due to the motion artifact, it is difficult to exclude a fluid or gas collection in that area. Surgically absent appendix by history. Unremarkable stomach. Vascular/Lymphatic: Atheromatous arterial calcifications without aneurysm. No enlarged lymph nodes. Reproductive: Status post hysterectomy. No adnexal masses. Other: Anterior subcutaneous edema and anterior subcutaneous air, compatible with recent subcutaneous  injection. Moderate-sized left inguinal hernia containing fat and small to moderate-sized right inguinal hernia containing fat Musculoskeletal: Lumbar spine degenerative changes. Multiple lumbar spine vertebral compression deformities that are new since 04/24/2018. Stable old T12 vertebral compression deformity. Possible acute fracture line in the anterior, superior corner of the L2 vertebral body and possible acute fracture line in the superior endplate of the L4 vertebral body. IMPRESSION: 1. Fluid and gas in the right lower quadrant of the abdomen which is most likely within the bowel. However, this can not be confirmed as intraluminal due to motion artifacts in that area. Correlation with the presence or absence of focal pain and tenderness in that area is recommended to help exclude the possibility of an abscess or contained perforation in the right lower quadrant of the abdomen. 2. Otherwise, no acute abnormality seen. 3. Decreased depth of inspiration with flattening of the trachea and mainstem bronchi, compatible with tracheobronchomalacia. 4. Mild bilateral lower lobe cylindrical bronchiectasis and associated mild parenchymal densities, most likely due to atelectasis. 5. Stable mild cardiomegaly. 6. Mildly dilated central pulmonary arteries, suggesting pulmonary arterial hypertension. 7. Multiple thoracic and lumbar spine vertebral compression deformities, some of which are new since 04/24/2018. Possible acute fracture lines in the anterior, superior corner of the L2 vertebral body and superior endplate of the L4 vertebral body. 8. Sigmoid colon diverticulosis. 9. Moderate-sized left inguinal hernia containing fat and small to moderate-sized right inguinal hernia containing fat. 10. Aortic atherosclerosis. Aortic Atherosclerosis (ICD10-I70.0). Electronically Signed   By: Beckie Salts M.D.   On: 07/24/2023 16:45   CT Head Wo Contrast Result Date: 07/24/2023 CLINICAL DATA:  Mental status change. Current  therapy for urinary tract infection. EXAM: CT HEAD WITHOUT CONTRAST TECHNIQUE: Contiguous axial images were obtained from the base of the skull through the vertex without intravenous contrast. RADIATION DOSE REDUCTION: This exam was performed according to the departmental dose-optimization program which includes automated exposure control, adjustment of the mA and/or kV according to patient size and/or use of iterative reconstruction technique. COMPARISON:  01/05/2023 FINDINGS: Brain: No evidence of acute infarction, hemorrhage, hydrocephalus, extra-axial collection or mass lesion/mass effect. Vascular: Encephalomalacia within the right occipital and posterior temporal lobes consistent with remote PCA infarct. Ex vacuo dilatation of the right posterolateral ventricle noted. There is mild diffuse low-attenuation within the subcortical and periventricular white matter compatible with chronic microvascular disease. Skull: Normal. Negative for fracture or focal lesion. Sinuses/Orbits: Air-fluid level within the sphenoid sinus. Partial opacification of the ethmoid air cells. Other: None. IMPRESSION: 1. No acute intracranial abnormalities.  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 4. Air-fluid level within the sphenoid sinus. Correlate for any clinical signs or symptoms of acute sinusitis. Electronically Signed   By: Signa Kell M.D.   On: 07/24/2023 16:15   DG Chest Port 1 View Result Date: 07/24/2023 CLINICAL DATA:  Questionable sepsis - evaluate for abnormality. Weakness. Altered mental status. EXAM: PORTABLE CHEST 1 VIEW COMPARISON:  01/05/2023. FINDINGS: Redemonstration of blunting of left lateral costophrenic angle, similar to the prior study, which may represent small left pleural effusion. There are probable associated atelectatic changes at the left lung base. Bilateral lung fields are otherwise clear. No acute consolidation or lung collapse. Right lateral costophrenic angle is clear.  Stable cardio-mediastinal silhouette. No acute osseous abnormalities. The soft tissues are within normal limits. There are surgical clips in the right upper quadrant, typical of a previous cholecystectomy. IMPRESSION: *Persistent blunting of left lateral costophrenic angle, which may represent small left pleural effusion. *Otherwise no acute cardiopulmonary abnormality seen. Electronically Signed   By: Jules Schick M.D.   On: 07/24/2023 14:16     Assessment & Plan:  [name] is a 83 y.o. female who was admitted with altered mentation and noted to be flu positive.  General surgery consulted for possible motion artifact on CT versus contained perforation/abscess.  -Upon my review of the CT scan, the axial view in a lung window (images 3/62-74), you can clearly see the significant motion artifact which is obscuring the gas pocket.  I believe that this gas is likely intraluminal.  I also do not see any other inflammatory changes (fat stranding) in the abdomen or near this area to suggest an inflammatory process.  While the patient has a small amount of tenderness on exam, she is nonperitoneal and has diffuse abdominal tenderness only with deep palpation, not just localized in the right side of the abdomen.  She also has no peritoneal signs.  With normal WBC and lactic acid, I feel that we can conservatively monitor the patient to see if her abdominal exam worsens -I do not believe the patient needs to be continued on antibiotics for a possible intra-abdominal infection -Would continue to hold Xarelto for another day or 2 to verify the patient has no significant changes in abdominal exam -Will order Dulcolax suppository for the patient -May trial clear liquids if patient able to tolerate oral intake with current mentation -If her abdominal exam worsens, would recommend repeat CT of the abdomen and pelvis -Remainder of care per primary team  -- Theophilus Kinds, DO [facility] [address] [place], [place] [address] [phone] (office)

## 2023-07-25 NOTE — Progress Notes (Signed)
SLP Cancellation Note  Patient Details Name: [name] MRN: [id number] DOB: 21-Feb-1941   Cancelled treatment:       Reason Eval/Treat Not Completed: Fatigue/lethargy limiting ability to participate. Pt's mentation is fluctuating and is not appropriate for full BSE at this time. Pt is on a clear liquid diet; Communicated with nursing that Pt should only be provided PO when alert and responsive. ST will continue efforts,  Shaylinn Hladik H. Romie Levee, CCC-SLP Speech Language Pathologist    Georgetta Haber 07/25/2023, 1:33 PM

## 2023-07-25 NOTE — ED Notes (Signed)
Nurse spoke with Speech Therapy and was advised to give pt PO based around pt's mentation. Pt has extensive esophageal hx and staff needs to give PO items with caution based on pt's current mentation at the time of administration. Nurse will pass this information along to next caretaker

## 2023-07-25 NOTE — Plan of Care (Signed)
   Problem: Education: Goal: Knowledge of General Education information will improve Description: Including pain rating scale, medication(s)/side effects and non-pharmacologic comfort measures Outcome: Not Progressing   Problem: Health Behavior/Discharge Planning: Goal: Ability to manage health-related needs will improve Outcome: Not Progressing   Problem: Clinical Measurements: Goal: Ability to maintain clinical measurements within normal limits will improve Outcome: Progressing

## 2023-07-25 NOTE — ED Notes (Signed)
ED TO INPATIENT HANDOFF REPORT  ED Nurse Name and Phone #: Cammy Copa [phone]  S Name/Age/Gender [name] 83 y.o. female Room/Bed: APA19/APA19  Code Status   Code Status: Limited: Do not attempt resuscitation (DNR) -DNR-LIMITED -Do Not Intubate/DNI   Home/SNF/Other Home Patient oriented to: self Is this baseline? Yes   Triage Complete: Triage complete  Chief Complaint Atrial fibrillation with RVR (HCC) [I48.91]  Triage Note EMS reports pt with current treatment for UTI and called for altered mental status.   Allergies Allergies  Allergen Reactions   Cefprozil Nausea And Vomiting    Unknown   Demerol Nausea And Vomiting   Meperidine Hcl Other (See Comments)    Unknown    Level of Care/Admitting Diagnosis ED Disposition     ED Disposition  Admit   Condition  --   Comment  Hospital Area: [facility] [[id number]]  Level of Care: Stepdown [14]  Covid Evaluation: Confirmed COVID Negative  Diagnosis: Atrial fibrillation with RVR [facility]) [[id number]]  Admitting Physician: Chiquita Loth  Attending Physician: Randol Kern, DAWOOD S [[id number]  Certification:: I certify this patient will need inpatient services for at least 2 midnights  Expected Medical Readiness: 07/27/2023          B Medical/Surgery History Past Medical History:  Diagnosis Date   Allergy    Arthritis    Asthma    CAD (coronary artery disease)    BMS and DES to LAD 2004; DES to D2 2005   Chronic pain of left knee    Depression    Diverticulitis of intestine    Essential hypertension    FH: colonic polyps    Adenomataous   GERD (gastroesophageal reflux disease)    History of kidney stones    Hyperlipidemia    Hypothyroidism    Insomnia    Irritable bowel syndrome    NSTEMI (non-ST elevated myocardial infarction) (HCC)    2005   Paroxysmal atrial fibrillation (HCC)    Pulmonary embolism (HCC)    2000 and 2010   Sleep apnea    Type 2 diabetes mellitus (HCC)    Venous  stasis    Past Surgical History:  Procedure Laterality Date   ABDOMINAL HYSTERECTOMY     APPENDECTOMY     BALLOON DILATION N/A 11/11/2013   Procedure: BALLOON DILATION;  Surgeon: Malissa Hippo, MD;  Location: [facility];  Service: Endoscopy;  Laterality: N/A;   BREAST REDUCTION SURGERY     BREAST SURGERY     CHOLECYSTECTOMY     CLEFT PALATE REPAIR     4 surgeries   COLONOSCOPY  2011   negative   COLONOSCOPY N/A 05/10/2014   Procedure: COLONOSCOPY;  Surgeon: Malissa Hippo, MD;  Location: [facility];  Service: Endoscopy;  Laterality: N/A;   ESOPHAGEAL DILATION N/A 03/08/2016   Procedure: ESOPHAGEAL DILATION;  Surgeon: Malissa Hippo, MD;  Location: [facility];  Service: Endoscopy;  Laterality: N/A;   ESOPHAGEAL DILATION N/A 05/16/2019   Procedure: ESOPHAGEAL DILATION;  Surgeon: West Bali, MD;  Location: [facility];  Service: Endoscopy;  Laterality: N/A;   ESOPHAGOGASTRODUODENOSCOPY N/A 11/11/2013   Procedure: ESOPHAGOGASTRODUODENOSCOPY (EGD);  Surgeon: Malissa Hippo, MD;  Location[facility];  Service: Endoscopy;  Laterality: N/A;  200-moved to 100 Ann notified pt   ESOPHAGOGASTRODUODENOSCOPY N/A 05/09/2014   Procedure: ESOPHAGOGASTRODUODENOSCOPY (EGD);  Surgeon: Malissa Hippo, MD;  Location[facility];  Service: Endoscopy;  Laterality: N/A;   ESOPHAGOGASTRODUODENOSCOPY N/A  03/08/2016   Procedure: ESOPHAGOGASTRODUODENOSCOPY (EGD);  Surgeon: Malissa Hippo, MD;  [facility];  Service: Endoscopy;  Laterality: N/A;  2:20   ESOPHAGOGASTRODUODENOSCOPY N/A 05/16/2019   Procedure: ESOPHAGOGASTRODUODENOSCOPY (EGD);  Surgeon: West Bali, MD;  [facility];  Service: Endoscopy;  Laterality: N/A;   HEEL SPUR SURGERY     KNEE SURGERY     MALONEY DILATION N/A 11/11/2013   Procedure: MALONEY DILATION;  Surgeon: Malissa Hippo, MD;  [facility];  Service: Endoscopy;  Laterality: N/A;   OOPHORECTOMY     SAVORY DILATION N/A  11/11/2013   Procedure: SAVORY DILATION;  Surgeon: Malissa Hippo, MD;  [facility];  Service: Endoscopy;  Laterality: N/A;   TONSILLECTOMY       A IV Location/Drains/Wounds Patient Lines/Drains/Airways Status     Active Line/Drains/Airways     Name Placement date Placement time Site Days   PICC Triple Lumen 07/25/23 Right Basilic 45 cm 1 cm 07/25/23  1610  -- less than 1            Intake/Output Last 24 hours No intake or output data in the 24 hours ending 07/25/23 1328  Labs/Imaging Results for orders placed or performed during the hospital encounter of 07/24/23 (from the past 48 hours)  Lactic acid, plasma     Status: None   Collection Time: 07/24/23  1:23 PM  Result Value Ref Range   Lactic Acid, Venous 1.3 0.5 - 1.9 mmol/L    Comment: Performed at [facility], [address], [place], [place] [address]  Comprehensive metabolic panel     Status: Abnormal   Collection Time: 07/24/23  1:23 PM  Result Value Ref Range   Sodium 141 135 - 145 mmol/L   Potassium 3.7 3.5 - 5.1 mmol/L   Chloride 105 98 - 111 mmol/L   CO2 27 22 - 32 mmol/L   Glucose, Bld 87 70 - 99 mg/dL    Comment: Glucose reference range applies only to samples taken after fasting for at least 8 hours.   BUN 11 8 - 23 mg/dL   Creatinine, Ser 4.09 0.44 - 1.00 mg/dL   Calcium 8.6 (L) 8.9 - 10.3 mg/dL   Total Protein 6.8 6.5 - 8.1 g/dL   Albumin 2.8 (L) 3.5 - 5.0 g/dL   AST 23 15 - 41 U/L   ALT 10 0 - 44 U/L   Alkaline Phosphatase 84 38 - 126 U/L   Total Bilirubin 0.6 0.0 - 1.2 mg/dL   GFR, Estimated >81 >19 mL/min    Comment: (NOTE) Calculated using the CKD-EPI Creatinine Equation (2021)    Anion gap 9 5 - 15    Comment: Performed at [facility], [address] [place], [place] [address]  CBC with Differential     Status: Abnormal   Collection Time: 07/24/23  1:23 PM  Result Value Ref Range   WBC 3.8 (L) 4.0 - 10.5 K/uL   RBC 4.69 3.87 - 5.11 MIL/uL   Hemoglobin 12.7 12.0 - 15.0 g/dL    HCT 95.6 21.3 - 08.6 %   MCV 90.6 80.0 - 100.0 fL   MCH 27.1 26.0 - 34.0 pg   MCHC 29.9 (L) 30.0 - 36.0 g/dL   RDW 57.8 (H) 46.9 - 62.9 %   Platelets 225 150 - 400 K/uL   nRBC 0.0 0.0 - 0.2 %   Neutrophils Relative % 72 %   Neutro Abs 2.7 1.7 - 7.7 K/uL  Lymphocytes Relative 14 %   Lymphs Abs 0.5 (L) 0.7 - 4.0 K/uL   Monocytes Relative 13 %   Monocytes Absolute 0.5 0.1 - 1.0 K/uL   Eosinophils Relative 1 %   Eosinophils Absolute 0.0 0.0 - 0.5 K/uL   Basophils Relative 0 %   Basophils Absolute 0.0 0.0 - 0.1 K/uL   Immature Granulocytes 0 %   Abs Immature Granulocytes 0.01 0.00 - 0.07 K/uL    Comment: Performed at [facility], [address] [place], [place] [address]  Protime-INR     Status: Abnormal   Collection Time: 07/24/23  1:23 PM  Result Value Ref Range   Prothrombin Time 16.3 (H) 11.4 - 15.2 seconds   INR 1.3 (H) 0.8 - 1.2    Comment: (NOTE) INR goal varies based on device and disease states. Performed at [facility], [address], [place], [place] [address]   APTT     Status: Abnormal   Collection Time: 07/24/23  1:23 PM  Result Value Ref Range   aPTT 42 (H) 24 - 36 seconds    Comment:        IF BASELINE aPTT IS ELEVATED, SUGGEST PATIENT RISK ASSESSMENT BE USED TO DETERMINE APPROPRIATE ANTICOAGULANT THERAPY. Performed at [facility], [address] [place], [place] [address]   Blood Culture (routine x 2)     Status: None (Preliminary result)   Collection Time: 07/24/23  1:23 PM   Specimen: BLOOD  Result Value Ref Range   Specimen Description BLOOD LEFT ANTECUBITAL    Special Requests      BOTTLES DRAWN AEROBIC ONLY Blood Culture adequate volume   Culture      NO GROWTH < 24 HOURS Performed at [facility], [address] [place], [place] [address]    Report Status PENDING   Blood Culture (routine x 2)     Status: None (Preliminary result)   Collection Time: 07/24/23  1:29 PM   Specimen: BLOOD  Result Value Ref Range   Specimen Description BLOOD  BLOOD LEFT FOREARM    Special Requests      BOTTLES DRAWN AEROBIC AND ANAEROBIC Blood Culture results may not be optimal due to an inadequate volume of blood received in culture bottles   Culture      NO GROWTH < 24 HOURS Performed at [facility], [address], [place], [place] [address]    Report Status PENDING   Resp panel by RT-PCR (RSV, Flu A&B, Covid) Anterior Nasal Swab     Status: Abnormal   Collection Time: 07/24/23  2:42 PM   Specimen: Anterior Nasal Swab  Result Value Ref Range   SARS Coronavirus 2 by RT PCR NEGATIVE NEGATIVE    Comment: (NOTE) SARS-CoV-2 target nucleic acids are NOT DETECTED.  The SARS-CoV-2 RNA is generally detectable in upper respiratory specimens during the acute phase of infection. The lowest concentration of SARS-CoV-2 viral copies this assay can detect is 138 copies/mL. A negative result does not preclude SARS-Cov-2 infection and should not be used as the sole basis for treatment or other patient management decisions. A negative result may occur with  improper specimen collection/handling, submission of specimen other than nasopharyngeal swab, presence of viral mutation(s) within the areas targeted by this assay, and inadequate number of viral copies(<138 copies/mL). A negative result must be combined with clinical observations, patient history, and epidemiological information. The expected result is Negative.  Fact Sheet for Patients:  BloggerCourse.com  Fact Sheet for Healthcare Providers:  SeriousBroker.it  This  test is no t yet approved or cleared by the [place] and  has been authorized for detection and/or diagnosis of SARS-CoV-2 by FDA under an Emergency Use Authorization (EUA). This EUA will remain  in effect (meaning this test can be used) for the duration of the COVID-19 declaration under Section 564(b)(1) of the Act, 21 U.S.C.section 360bbb-3(b)(1), unless the authorization  is terminated  or revoked sooner.       Influenza A by PCR POSITIVE (A) NEGATIVE   Influenza B by PCR NEGATIVE NEGATIVE    Comment: (NOTE) The Xpert Xpress SARS-CoV-2/FLU/RSV plus assay is intended as an aid in the diagnosis of influenza from Nasopharyngeal swab specimens and should not be used as a sole basis for treatment. Nasal washings and aspirates are unacceptable for Xpert Xpress SARS-CoV-2/FLU/RSV testing.  Fact Sheet for Patients: BloggerCourse.com  Fact Sheet for Healthcare Providers: SeriousBroker.it  This test is not yet approved or cleared by the [place] FDA and has been authorized for detection and/or diagnosis of SARS-CoV-2 by FDA under an Emergency Use Authorization (EUA). This EUA will remain in effect (meaning this test can be used) for the duration of the COVID-19 declaration under Section 564(b)(1) of the Act, 21 U.S.C. section 360bbb-3(b)(1), unless the authorization is terminated or revoked.     Resp Syncytial Virus by PCR NEGATIVE NEGATIVE    Comment: (NOTE) Fact Sheet for Patients: BloggerCourse.com  Fact Sheet for Healthcare Providers: SeriousBroker.it  This test is not yet approved or cleared by the [place] FDA and has been authorized for detection and/or diagnosis of SARS-CoV-2 by FDA under an Emergency Use Authorization (EUA). This EUA will remain in effect (meaning this test can be used) for the duration of the COVID-19 declaration under Section 564(b)(1) of the Act, 21 U.S.C. section 360bbb-3(b)(1), unless the authorization is terminated or revoked.  Performed at [facility], [address], [place], [place] [address]   Lactic acid, plasma     Status: None   Collection Time: 07/24/23  2:44 PM  Result Value Ref Range   Lactic Acid, Venous 1.4 0.5 - 1.9 mmol/L    Comment: Performed at [facility], [address] [place],  [place] [address]  Basic metabolic panel     Status: Abnormal   Collection Time: 07/25/23  4:03 AM  Result Value Ref Range   Sodium 142 135 - 145 mmol/L   Potassium 4.3 3.5 - 5.1 mmol/L   Chloride 107 98 - 111 mmol/L   CO2 25 22 - 32 mmol/L   Glucose, Bld 67 (L) 70 - 99 mg/dL    Comment: Glucose reference range applies only to samples taken after fasting for at least 8 hours.   BUN 11 8 - 23 mg/dL   Creatinine, Ser 0.98 0.44 - 1.00 mg/dL   Calcium 8.5 (L) 8.9 - 10.3 mg/dL   GFR, Estimated >11 >91 mL/min    Comment: (NOTE) Calculated using the CKD-EPI Creatinine Equation (2021)    Anion gap 10 5 - 15    Comment: Performed at [facility], [address] [place], [place] [address]  CBC     Status: Abnormal   Collection Time: 07/25/23  4:03 AM  Result Value Ref Range   WBC 4.6 4.0 - 10.5 K/uL   RBC 4.35 3.87 - 5.11 MIL/uL   Hemoglobin 11.7 (L) 12.0 - 15.0 g/dL   HCT 56.2 13.0 - 86.5 %   MCV 93.6 80.0 - 100.0 fL   MCH 26.9 26.0 - 34.0  pg   MCHC 28.7 (L) 30.0 - 36.0 g/dL   RDW 54.0 (H) 98.1 - 19.1 %   Platelets 210 150 - 400 K/uL   nRBC 0.0 0.0 - 0.2 %    Comment: Performed at [facility], [address] [place], [place] [address]  CBG monitoring, ED     Status: Abnormal   Collection Time: 07/25/23  6:47 AM  Result Value Ref Range   Glucose-Capillary 159 (H) 70 - 99 mg/dL    Comment: Glucose reference range applies only to samples taken after fasting for at least 8 hours.   DG Chest Portable 1 View Result Date: 07/25/2023 CLINICAL DATA:  PICC line placement EXAM: PORTABLE CHEST 1 VIEW COMPARISON:  Chest radiograph and CT chest, abdomen, and pelvis dated 07/24/2023. FINDINGS: Right PICC line tip overlies the lower SVC. Low lung volumes. Stable cardiomegaly. Aortic atherosclerosis. Similar indistinctness of the left costophrenic angle, likely correlates with prominent subpleural fat noted on the prior CT. No focal consolidation, sizeable pleural effusion, or pneumothorax. The visualized  osseous structures are unchanged. IMPRESSION: 1. Right PICC line tip overlies the lower SVC. 2. Low lung volumes.  No acute cardiopulmonary findings. Electronically Signed   By: Hart Robinsons M.D.   On: 07/25/2023 13:11   [place] EKG SITE RITE Result Date: 07/25/2023 If Site Rite image not attached, placement could not be confirmed due to current cardiac rhythm.  CT CHEST ABDOMEN PELVIS W CONTRAST Result Date: 07/24/2023 CLINICAL DATA:  Sepsis. Urinary tract infection. Previous appendectomy, hysterectomy, oophorectomy and cholecystectomy. EXAM: CT CHEST, ABDOMEN, AND PELVIS WITH CONTRAST TECHNIQUE: Multidetector CT imaging of the chest, abdomen and pelvis was performed following the standard protocol during bolus administration of intravenous contrast. RADIATION DOSE REDUCTION: This exam was performed according to the departmental dose-optimization program which includes automated exposure control, adjustment of the mA and/or kV according to patient size and/or use of iterative reconstruction technique. CONTRAST:  OMNIPAQUE IOHEXOL 300 MG/ML  SOLN COMPARISON:  Abdomen and pelvis CT dated 04/24/2018. Portable chest obtained earlier today. Chest CT report dated 05/15/2019. Chest CTA dated 10/18/2008. FINDINGS: CT CHEST FINDINGS Cardiovascular: Stable mildly enlarged heart aortic valve calcifications and dense mitral valve annulus calcifications. Atheromatous calcifications, including the coronary arteries and aorta. The central pulmonary arteries remain mildly dilated with a main pulmonary artery diameter of 3.4 cm. Mediastinum/Nodes: No enlarged mediastinal, hilar, or axillary lymph nodes. Interval flattening of the trachea and mainstem bronchi with an interval decreased depth of inspiration. Unremarkable thyroid gland and esophagus. Lungs/Pleura: Decreased depth of inspiration. Mild bilateral lower lobe cylindrical bronchiectasis and associated mild parenchymal densities. Prominent subpleural fat on the  left causing the blunting of the lateral costophrenic angle seen radiographically. No pleural fluid Musculoskeletal: Thoracic spine degenerative changes multiple thoracic vertebral compression deformities with mild bony retropulsion and no visible acute fracture lines. Associated acute kyphosis in the upper thoracic spine. CT ABDOMEN PELVIS FINDINGS Hepatobiliary: No focal liver abnormality is seen. Status post cholecystectomy. No biliary dilatation. Pancreas: Unremarkable. No pancreatic ductal dilatation or surrounding inflammatory changes. Spleen: Normal in size without focal abnormality. Adrenals/Urinary Tract: Normal-appearing adrenal glands multiple bilateral simple appearing renal cysts. These do not need imaging follow-up unremarkable urinary bladder and ureters Stomach/Bowel: Large number of sigmoid colon diverticula without evidence of diverticulitis. Breathing motion blurring obscuring the detail in the right lower quadrant of the abdomen. There is some fluid in gas in that region of the abdomen that is most likely intraluminal. However, due to the motion artifact, it is  difficult to exclude a fluid or gas collection in that area. Surgically absent appendix by history. Unremarkable stomach. Vascular/Lymphatic: Atheromatous arterial calcifications without aneurysm. No enlarged lymph nodes. Reproductive: Status post hysterectomy. No adnexal masses. Other: Anterior subcutaneous edema and anterior subcutaneous air, compatible with recent subcutaneous injection. Moderate-sized left inguinal hernia containing fat and small to moderate-sized right inguinal hernia containing fat Musculoskeletal: Lumbar spine degenerative changes. Multiple lumbar spine vertebral compression deformities that are new since 04/24/2018. Stable old T12 vertebral compression deformity. Possible acute fracture line in the anterior, superior corner of the L2 vertebral body and possible acute fracture line in the superior endplate of the L4  vertebral body. IMPRESSION: 1. Fluid and gas in the right lower quadrant of the abdomen which is most likely within the bowel. However, this can not be confirmed as intraluminal due to motion artifacts in that area. Correlation with the presence or absence of focal pain and tenderness in that area is recommended to help exclude the possibility of an abscess or contained perforation in the right lower quadrant of the abdomen. 2. Otherwise, no acute abnormality seen. 3. Decreased depth of inspiration with flattening of the trachea and mainstem bronchi, compatible with tracheobronchomalacia. 4. Mild bilateral lower lobe cylindrical bronchiectasis and associated mild parenchymal densities, most likely due to atelectasis. 5. Stable mild cardiomegaly. 6. Mildly dilated central pulmonary arteries, suggesting pulmonary arterial hypertension. 7. Multiple thoracic and lumbar spine vertebral compression deformities, some of which are new since 04/24/2018. Possible acute fracture lines in the anterior, superior corner of the L2 vertebral body and superior endplate of the L4 vertebral body. 8. Sigmoid colon diverticulosis. 9. Moderate-sized left inguinal hernia containing fat and small to moderate-sized right inguinal hernia containing fat. 10. Aortic atherosclerosis. Aortic Atherosclerosis (ICD10-I70.0). Electronically Signed   By: Beckie Salts M.D.   On: 07/24/2023 16:45   CT Head Wo Contrast Result Date: 07/24/2023 CLINICAL DATA:  Mental status change. Current therapy for urinary tract infection. EXAM: CT HEAD WITHOUT CONTRAST TECHNIQUE: Contiguous axial images were obtained from the base of the skull through the vertex without intravenous contrast. RADIATION DOSE REDUCTION: This exam was performed according to the departmental dose-optimization program which includes automated exposure control, adjustment of the mA and/or kV according to patient size and/or use of iterative reconstruction technique. COMPARISON:   01/05/2023 FINDINGS: Brain: No evidence of acute infarction, hemorrhage, hydrocephalus, extra-axial collection or mass lesion/mass effect. Vascular: Encephalomalacia within the right occipital and posterior temporal lobes consistent with remote PCA infarct. Ex vacuo dilatation of the right posterolateral ventricle noted. There is mild diffuse low-attenuation within the subcortical and periventricular white matter compatible with chronic microvascular disease. Skull: Normal. Negative for fracture or focal lesion. Sinuses/Orbits: Air-fluid level within the sphenoid sinus. Partial opacification of the ethmoid air cells. Other: None. IMPRESSION: 1. No acute intracranial abnormalities. 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 4. Air-fluid level within the sphenoid sinus. Correlate for any clinical signs or symptoms of acute sinusitis. Electronically Signed   By: Signa Kell M.D.   On: 07/24/2023 16:15   DG Chest Port 1 View Result Date: 07/24/2023 CLINICAL DATA:  Questionable sepsis - evaluate for abnormality. Weakness. Altered mental status. EXAM: PORTABLE CHEST 1 VIEW COMPARISON:  01/05/2023. FINDINGS: Redemonstration of blunting of left lateral costophrenic angle, similar to the prior study, which may represent small left pleural effusion. There are probable associated atelectatic changes at the left lung base. Bilateral lung fields are otherwise clear. No acute consolidation or lung collapse. Right lateral  costophrenic angle is clear. Stable cardio-mediastinal silhouette. No acute osseous abnormalities. The soft tissues are within normal limits. There are surgical clips in the right upper quadrant, typical of a previous cholecystectomy. IMPRESSION: *Persistent blunting of left lateral costophrenic angle, which may represent small left pleural effusion. *Otherwise no acute cardiopulmonary abnormality seen. Electronically Signed   By: Jules Schick M.D.   On: 07/24/2023 14:16    Pending  Labs Unresulted Labs (From admission, onward)     Start     Ordered   07/25/23 1320  MRSA Next Gen by PCR, Nasal  Once,   R        07/25/23 1319   07/24/23 1305  Urinalysis, w/ Reflex to Culture (Infection Suspected) -Urine, Catheterized  (Undifferentiated presentation (screening labs and basic nursing orders))  ONCE - URGENT,   URGENT       Question:  Specimen Source  Answer:  Urine, Catheterized   07/24/23 1305            Vitals/Pain Today's Vitals   07/25/23 1020 07/25/23 1048 07/25/23 1100 07/25/23 1231  BP:   99/79 101/70  Pulse: (!) 114  100 80  Resp: 15  17 20   Temp:      TempSrc:      SpO2: 96% 98% 97% 97%  Weight:      PainSc:        Isolation Precautions No active isolations  Medications Medications  diltiazem (CARDIZEM) 1 mg/mL load via infusion 10 mg (10 mg Intravenous Bolus from Bag 07/24/23 2034)    And  diltiazem (CARDIZEM) 125 mg in dextrose 5% 125 mL (1 mg/mL) infusion (0 mg/hr Intravenous Stopped 07/25/23 0933)  oseltamivir (TAMIFLU) capsule 75 mg (75 mg Oral Given 07/25/23 1040)  rosuvastatin (CRESTOR) tablet 20 mg (20 mg Oral Given 07/25/23 1045)  buPROPion (WELLBUTRIN XL) 24 hr tablet 150 mg (150 mg Oral Given 07/25/23 1040)  escitalopram (LEXAPRO) tablet 20 mg (20 mg Oral Given 07/25/23 1040)  levothyroxine (SYNTHROID) tablet 50 mcg (50 mcg Oral Given 07/25/23 1610)  magnesium oxide (MAG-OX) tablet 400 mg (400 mg Oral Given 07/25/23 1040)  meclizine (ANTIVERT) tablet 25 mg (has no administration in time range)  pantoprazole (PROTONIX) EC tablet 40 mg (40 mg Oral Given 07/25/23 1040)  montelukast (SINGULAIR) tablet 10 mg (has no administration in time range)  acetaminophen (TYLENOL) tablet 650 mg (has no administration in time range)    Or  acetaminophen (TYLENOL) suppository 650 mg (has no administration in time range)  polyethylene glycol (MIRALAX / GLYCOLAX) packet 17 g (has no administration in time range)  magnesium citrate solution 1 Bottle (has no  administration in time range)  hydrALAZINE (APRESOLINE) injection 5 mg (has no administration in time range)  diltiazem (CARDIZEM) tablet 120 mg (120 mg Oral Given 07/25/23 1038)  hydrOXYzine (ATARAX) tablet 25 mg (25 mg Oral Given 07/25/23 1040)  polyvinyl alcohol (LIQUIFILM TEARS) 1.4 % ophthalmic solution 1 drop (has no administration in time range)  budesonide (PULMICORT) nebulizer solution 0.25 mg (0.25 mg Nebulization Given 07/25/23 1048)  bisacodyl (DULCOLAX) suppository 10 mg (has no administration in time range)  Chlorhexidine Gluconate Cloth 2 % PADS 6 each (has no administration in time range)  lactated ringers bolus 1,000 mL (0 mLs Intravenous Stopped 07/24/23 1524)  diltiazem (CARDIZEM) injection 20 mg (20 mg Intravenous Given 07/24/23 1534)  iohexol (OMNIPAQUE) 300 MG/ML solution 100 mL (100 mLs Intravenous Contrast Given 07/24/23 1514)  dextrose 50 % solution 25 g (25 g Intravenous Given 07/25/23  1610)    Mobility non-ambulatory     Focused Assessments Cardiac Assessment Handoff:  Cardiac Rhythm: Atrial fibrillation Lab Results  Component Value Date   CKTOTAL 65 03/05/2011   CKMB 2.5 03/05/2011   TROPONINI <0.03 04/25/2018   Lab Results  Component Value Date   DDIMER 0.22 03/05/2011   Does the Patient currently have chest pain? No    R Recommendations: See Admitting Provider Note  Report given to:   Additional Notes:  PICC line Right Bicep \\Flu  +

## 2023-07-26 DIAGNOSIS — R935 Abnormal findings on diagnostic imaging of other abdominal regions, including retroperitoneum: Secondary | ICD-10-CM | POA: Diagnosis not present

## 2023-07-26 DIAGNOSIS — G9340 Encephalopathy, unspecified: Secondary | ICD-10-CM | POA: Diagnosis not present

## 2023-07-26 DIAGNOSIS — E118 Type 2 diabetes mellitus with unspecified complications: Secondary | ICD-10-CM | POA: Diagnosis not present

## 2023-07-26 DIAGNOSIS — J101 Influenza due to other identified influenza virus with other respiratory manifestations: Secondary | ICD-10-CM | POA: Diagnosis not present

## 2023-07-26 DIAGNOSIS — I4891 Unspecified atrial fibrillation: Secondary | ICD-10-CM | POA: Diagnosis not present

## 2023-07-26 DIAGNOSIS — I480 Paroxysmal atrial fibrillation: Secondary | ICD-10-CM | POA: Diagnosis not present

## 2023-07-26 LAB — BASIC METABOLIC PANEL
Anion gap: 7 (ref 5–15)
BUN: 9 mg/dL (ref 8–23)
CO2: 26 mmol/L (ref 22–32)
Calcium: 8.2 mg/dL — ABNORMAL LOW (ref 8.9–10.3)
Chloride: 105 mmol/L (ref 98–111)
Creatinine, Ser: 0.44 mg/dL (ref 0.44–1.00)
GFR, Estimated: >60 mL/min (ref >60)
Glucose, Bld: 80 mg/dL (ref 70–99)
Potassium: 3.5 mmol/L (ref 3.5–5.1)
Sodium: 138 mmol/L (ref 135–145)

## 2023-07-26 LAB — CBC
HCT: 30.9 % — ABNORMAL LOW (ref 36.0–46.0)
Hemoglobin: 9.1 g/dL — ABNORMAL LOW (ref 12.0–15.0)
MCH: 26.7 pg (ref 26.0–34.0)
MCHC: 29.4 g/dL — ABNORMAL LOW (ref 30.0–36.0)
MCV: 90.6 fL (ref 80.0–100.0)
Platelets: 165 10*3/uL (ref 150–400)
RBC: 3.41 MIL/uL — ABNORMAL LOW (ref 3.87–5.11)
RDW: 16.4 % — ABNORMAL HIGH (ref 11.5–15.5)
WBC: 4.4 10*3/uL (ref 4.0–10.5)
nRBC: 0 % (ref 0.0–0.2)

## 2023-07-26 LAB — GLUCOSE, CAPILLARY: Glucose-Capillary: 81 mg/dL (ref 70–99)

## 2023-07-26 MED ORDER — IPRATROPIUM BROMIDE 0.02 % IN SOLN
0.5000 mg | Freq: Once | RESPIRATORY_TRACT | Status: AC
  Administered 2023-07-26: 0.5 mg via RESPIRATORY_TRACT
  Filled 2023-07-26: qty 2.5

## 2023-07-26 MED ORDER — IPRATROPIUM BROMIDE 0.02 % IN SOLN
0.5000 mg | Freq: Three times a day (TID) | RESPIRATORY_TRACT | Status: DC
  Administered 2023-07-26 – 2023-08-07 (×37): 0.5 mg via RESPIRATORY_TRACT
  Filled 2023-07-26 (×38): qty 2.5

## 2023-07-26 MED ORDER — APIXABAN 5 MG PO TABS
5.0000 mg | ORAL_TABLET | Freq: Two times a day (BID) | ORAL | Status: DC
  Administered 2023-07-26 – 2023-08-03 (×11): 5 mg via ORAL
  Filled 2023-07-26 (×16): qty 1

## 2023-07-26 NOTE — Progress Notes (Signed)
[facility] Progress Note     Subjective: Patient seen and examined.  She is resting comfortably in bed.  She will awaken when spoken to but does not answer questions.  Objective: Vital signs in last 24 hours: Temp:  [96.1 F (35.6 C)-98.1 F (36.7 C)] 96.6 F (35.9 C) (02/01 0700) Pulse Rate:  [66-114] 81 (02/01 0700) Resp:  [14-24] 17 (02/01 0700) BP: (96-120)/(45-80) 100/53 (02/01 0700) SpO2:  [87 %-100 %] 92 % (02/01 0700) Weight:  [90.3 kg-91 kg] 91 kg (02/01 0400) Last BM Date :  (PTA)  Intake/Output from previous day: 01/31 0701 - 02/01 0700 In: 1058.2 [I.V.:1058.2] Out: 98 [Urine:98] Intake/Output this shift: No intake/output data recorded.  General appearance: alert, cooperative, and no distress GI: Abdomen soft, nondistended, no percussion tenderness, nontender to palpation; no rigidity, guarding, rebound tenderness  Lab Results:  Recent Labs    07/25/23 0403 07/26/23 0753  WBC 4.6 4.4  HGB 11.7* 9.1*  HCT 40.7 30.9*  PLT 210 165   BMET Recent Labs    07/25/23 0403 07/26/23 0753  NA 142 138  K 4.3 3.5  CL 107 105  CO2 25 26  GLUCOSE 67* 80  BUN 11 9  CREATININE 0.55 0.44  CALCIUM 8.5* 8.2*   PT/INR Recent Labs    07/24/23 1323  LABPROT 16.3*  INR 1.3*    Studies/Results: DG Chest Portable 1 View Result Date: 07/25/2023 CLINICAL DATA:  PICC line placement EXAM: PORTABLE CHEST 1 VIEW COMPARISON:  Chest radiograph and CT chest, abdomen, and pelvis dated 07/24/2023. FINDINGS: Right PICC line tip overlies the lower SVC. Low lung volumes. Stable cardiomegaly. Aortic atherosclerosis. Similar indistinctness of the left costophrenic angle, likely correlates with prominent subpleural fat noted on the prior CT. No focal consolidation, sizeable pleural effusion, or pneumothorax. The visualized osseous structures are unchanged. IMPRESSION: 1. Right PICC line tip overlies the lower SVC. 2. Low lung volumes.  No acute cardiopulmonary  findings. Electronically Signed   By: Hart Robinsons M.D.   On: 07/25/2023 13:11   [place] EKG SITE RITE Result Date: 07/25/2023 If Site Rite image not attached, placement could not be confirmed due to current cardiac rhythm.  CT CHEST ABDOMEN PELVIS W CONTRAST Result Date: 07/24/2023 CLINICAL DATA:  Sepsis. Urinary tract infection. Previous appendectomy, hysterectomy, oophorectomy and cholecystectomy. EXAM: CT CHEST, ABDOMEN, AND PELVIS WITH CONTRAST TECHNIQUE: Multidetector CT imaging of the chest, abdomen and pelvis was performed following the standard protocol during bolus administration of intravenous contrast. RADIATION DOSE REDUCTION: This exam was performed according to the departmental dose-optimization program which includes automated exposure control, adjustment of the mA and/or kV according to patient size and/or use of iterative reconstruction technique. CONTRAST:  OMNIPAQUE IOHEXOL 300 MG/ML  SOLN COMPARISON:  Abdomen and pelvis CT dated 04/24/2018. Portable chest obtained earlier today. Chest CT report dated 05/15/2019. Chest CTA dated 10/18/2008. FINDINGS: CT CHEST FINDINGS Cardiovascular: Stable mildly enlarged heart aortic valve calcifications and dense mitral valve annulus calcifications. Atheromatous calcifications, including the coronary arteries and aorta. The central pulmonary arteries remain mildly dilated with a main pulmonary artery diameter of 3.4 cm. Mediastinum/Nodes: No enlarged mediastinal, hilar, or axillary lymph nodes. Interval flattening of the trachea and mainstem bronchi with an interval decreased depth of inspiration. Unremarkable thyroid gland and esophagus. Lungs/Pleura: Decreased depth of inspiration. Mild bilateral lower lobe cylindrical bronchiectasis and associated mild parenchymal densities. Prominent subpleural fat on the left causing the blunting of the lateral costophrenic angle seen radiographically. No pleural fluid Musculoskeletal:  Thoracic spine  degenerative changes multiple thoracic vertebral compression deformities with mild bony retropulsion and no visible acute fracture lines. Associated acute kyphosis in the upper thoracic spine. CT ABDOMEN PELVIS FINDINGS Hepatobiliary: No focal liver abnormality is seen. Status post cholecystectomy. No biliary dilatation. Pancreas: Unremarkable. No pancreatic ductal dilatation or surrounding inflammatory changes. Spleen: Normal in size without focal abnormality. Adrenals/Urinary Tract: Normal-appearing adrenal glands multiple bilateral simple appearing renal cysts. These do not need imaging follow-up unremarkable urinary bladder and ureters Stomach/Bowel: Large number of sigmoid colon diverticula without evidence of diverticulitis. Breathing motion blurring obscuring the detail in the right lower quadrant of the abdomen. There is some fluid in gas in that region of the abdomen that is most likely intraluminal. However, due to the motion artifact, it is difficult to exclude a fluid or gas collection in that area. Surgically absent appendix by history. Unremarkable stomach. Vascular/Lymphatic: Atheromatous arterial calcifications without aneurysm. No enlarged lymph nodes. Reproductive: Status post hysterectomy. No adnexal masses. Other: Anterior subcutaneous edema and anterior subcutaneous air, compatible with recent subcutaneous injection. Moderate-sized left inguinal hernia containing fat and small to moderate-sized right inguinal hernia containing fat Musculoskeletal: Lumbar spine degenerative changes. Multiple lumbar spine vertebral compression deformities that are new since 04/24/2018. Stable old T12 vertebral compression deformity. Possible acute fracture line in the anterior, superior corner of the L2 vertebral body and possible acute fracture line in the superior endplate of the L4 vertebral body. IMPRESSION: 1. Fluid and gas in the right lower quadrant of the abdomen which is most likely within the bowel.  However, this can not be confirmed as intraluminal due to motion artifacts in that area. Correlation with the presence or absence of focal pain and tenderness in that area is recommended to help exclude the possibility of an abscess or contained perforation in the right lower quadrant of the abdomen. 2. Otherwise, no acute abnormality seen. 3. Decreased depth of inspiration with flattening of the trachea and mainstem bronchi, compatible with tracheobronchomalacia. 4. Mild bilateral lower lobe cylindrical bronchiectasis and associated mild parenchymal densities, most likely due to atelectasis. 5. Stable mild cardiomegaly. 6. Mildly dilated central pulmonary arteries, suggesting pulmonary arterial hypertension. 7. Multiple thoracic and lumbar spine vertebral compression deformities, some of which are new since 04/24/2018. Possible acute fracture lines in the anterior, superior corner of the L2 vertebral body and superior endplate of the L4 vertebral body. 8. Sigmoid colon diverticulosis. 9. Moderate-sized left inguinal hernia containing fat and small to moderate-sized right inguinal hernia containing fat. 10. Aortic atherosclerosis. Aortic Atherosclerosis (ICD10-I70.0). Electronically Signed   By: Beckie Salts M.D.   On: 07/24/2023 16:45   CT Head Wo Contrast Result Date: 07/24/2023 CLINICAL DATA:  Mental status change. Current therapy for urinary tract infection. EXAM: CT HEAD WITHOUT CONTRAST TECHNIQUE: Contiguous axial images were obtained from the base of the skull through the vertex without intravenous contrast. RADIATION DOSE REDUCTION: This exam was performed according to the departmental dose-optimization program which includes automated exposure control, adjustment of the mA and/or kV according to patient size and/or use of iterative reconstruction technique. COMPARISON:  01/05/2023 FINDINGS: Brain: No evidence of acute infarction, hemorrhage, hydrocephalus, extra-axial collection or mass lesion/mass  effect. Vascular: Encephalomalacia within the right occipital and posterior temporal lobes consistent with remote PCA infarct. Ex vacuo dilatation of the right posterolateral ventricle noted. There is mild diffuse low-attenuation within the subcortical and periventricular white matter compatible with chronic microvascular disease. Skull: Normal. Negative for fracture or focal lesion. Sinuses/Orbits: Air-fluid level within the  sphenoid sinus. Partial opacification of the ethmoid air cells. Other: None. IMPRESSION: 1. No acute intracranial abnormalities. 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 4. Air-fluid level within the sphenoid sinus. Correlate for any clinical signs or symptoms of acute sinusitis. Electronically Signed   By: Signa Kell M.D.   On: 07/24/2023 16:15   DG Chest Port 1 View Result Date: 07/24/2023 CLINICAL DATA:  Questionable sepsis - evaluate for abnormality. Weakness. Altered mental status. EXAM: PORTABLE CHEST 1 VIEW COMPARISON:  01/05/2023. FINDINGS: Redemonstration of blunting of left lateral costophrenic angle, similar to the prior study, which may represent small left pleural effusion. There are probable associated atelectatic changes at the left lung base. Bilateral lung fields are otherwise clear. No acute consolidation or lung collapse. Right lateral costophrenic angle is clear. Stable cardio-mediastinal silhouette. No acute osseous abnormalities. The soft tissues are within normal limits. There are surgical clips in the right upper quadrant, typical of a previous cholecystectomy. IMPRESSION: *Persistent blunting of left lateral costophrenic angle, which may represent small left pleural effusion. *Otherwise no acute cardiopulmonary abnormality seen. Electronically Signed   By: Jules Schick M.D.   On: 07/24/2023 14:16    Anti-infectives: Anti-infectives (From admission, onward)    Start     Dose/Rate Route Frequency Ordered Stop   07/25/23 1200   amoxicillin-clavulanate (AUGMENTIN) 875-125 MG per tablet 1 tablet  Status:  Discontinued        1 tablet Oral Every 12 hours 07/25/23 1046 07/25/23 1126   07/24/23 2200  oseltamivir (TAMIFLU) capsule 75 mg        75 mg Oral 2 times daily 07/24/23 2027 07/29/23 2159   07/24/23 2200  piperacillin-tazobactam (ZOSYN) IVPB 3.375 g  Status:  Discontinued        3.375 g 12.5 mL/hr over 240 Minutes Intravenous Every 8 hours 07/24/23 2029 07/25/23 1046       Assessment/Plan:  Patient is an 83 year old female who was admitted with altered mental status and flu.  General surgery consulted for possible motion artifact on CT versus contained perforation/abscess.  -Patient with benign abdominal exam, stable vitals, and no leukocytosis. -Again, I believe the imaging findings are consistent with intraluminal gas/fluid that is obscured secondary to motion artifact -No need for antibiotics from surgical standpoint -Okay for clear liquids from surgical standpoint, though with patient's mentation, she may not be able to tolerate oral intake -Okay to restart Xarelto from surgical standpoint -Recommend repeat CT of the abdomen and pelvis if there is concern for worsening abdominal exam or hemodynamic status change -Care per primary team   LOS: 2 days    Ashely Goosby A Nzinga Ferran 07/26/2023

## 2023-07-26 NOTE — Progress Notes (Signed)
Attempted several different ways to give patient pills and she refused to take the several ways I tried.

## 2023-07-26 NOTE — Progress Notes (Signed)
PROGRESS NOTE   [name]  MWU:[id number] DOB: 1940-10-17 DOA: 07/24/2023 PCP: Merlyn Albert, MD (Inactive)   Chief Complaint  Patient presents with   Altered Mental Status   Level of care: Stepdown  Brief Admission History:  83 y.o. female, with a history of paroxysmal atrial fibrillation, diabetes mellitus type 2, hypertension, dysphagia, pulmonary embolus, hypothyroidism, and esophageal dysmotility, dementia, living at SNF for last 5 years, patient with advanced dementia at baseline, bedbound as discussed with family. -Patient was brought by ED secondary to altered mental status, increased lethargy, patient was noted to be altered since yesterday, apparently she is on antibiotics for UTI, as discussed with family, she is with advanced dementia, but usually communicative, remember some family members, bedbound at baseline.   -In ED she was altered, CT head with no acute findings, significant for chronic infarcts, CT abdomen pelvics with questionable right lower quadrant gas-fluid collection, likely within the bowels, but due to emotional artifact could not rule out abscess or perforated fluid collection, her respiratory panel significant for positive influenza A, [facility] consulted to admit   Assessment and Plan:  Acute metabolic encephalopathy -Most likely in setting of infectious process, including influenza -CT head with no acute findings -Per reports patient was treated for UTI at facility -palliative medicine consultation due to failure to thrive, advancing dementia, poor oral intake   Influenza infection -continue Tamiflu   A-fib with RVR -On apixaban for anticoagulation, ok to resume today per surgeon -currently off Cardizem drip and back on oral cardizem CD    Questionable right lower quadrant fluid collection, likely just just fluid-gas most likely within the bowel -CT abdomen showing fluid and gas in the right lower quadrant of the abdomen, possibly within  bowel, been difficult to confirm due to motion artifact , mentation to exclude possibility of abscess or contained perforation -Consulted general surgery - see consult notes  -advanced to clears diet per surgeon -ok to discontinue Zosyn per surgeon -ok to resume apixaban per surgeon   History of PE -Resume apixaban today per surgeon   Dementia Deconditioning -Bedbound at baseline -Continue with supportive care   Hyperlipidemia -continue with statin   Hypothyroidism -Continue with Synthroid   Hypertension -Continue with Cardizem CD   Right forearm fluid collection likely hematoma -Is not warm or tender, likely related to hematoma from recent fall, ED physician attempted to aspirate only small amount of blood came out   DVT prophylaxis: SCDs Code Status: DNR  Family Communication: t/c to brother 1/31 no answer  Disposition: TBD    Consultants:  Surgery  Procedures:   Antimicrobials:    Subjective: Intermittent responsiveness. Not complaining of abdominal pain today with examination.  Objective: Vitals:   07/26/23 0500 07/26/23 0600 07/26/23 0700 07/26/23 1139  BP: 119/80 96/80 (!) 100/53   Pulse: (!) 108 93 81   Resp: (!) 24 18 17    Temp:   (!) 96.6 F (35.9 C) (!) 97.5 F (36.4 C)  TempSrc:   Axillary Axillary  SpO2: 98% 95% 92%   Weight:      Height:        Intake/Output Summary (Last 24 hours) at 07/26/2023 1310 Last data filed at 07/26/2023 0647 Gross per 24 hour  Intake 1058.21 ml  Output 98 ml  Net 960.21 ml   Filed Weights   07/24/23 1350 07/25/23 1400 07/26/23 0400  Weight: 117.9 kg 90.3 kg 91 kg   Examination:  General exam: appears comfortable, with advanced dementia.  Respiratory system: Clear to auscultation. Respiratory effort normal. Cardiovascular system: normal S1 & S2 heard. No JVD, murmurs, rubs, gallops or clicks. No pedal edema. Gastrointestinal system: Abdomen is obese, soft and NO tenderness to palpation.  No organomegaly or  masses felt. Normal bowel sounds heard. Central nervous system: Alert and oriented. No focal neurological deficits. Extremities: large right forearm hematoma. Symmetric 5 x 5 power. Skin: No rashes, lesions or ulcers. Psychiatry: Judgement and insight appear diminished with severe dementia. Mood & affect flat.   Data Reviewed: I have personally reviewed following labs and imaging studies  CBC: Recent Labs  Lab 07/24/23 1323 07/25/23 0403 07/26/23 0753  WBC 3.8* 4.6 4.4  NEUTROABS 2.7  --   --   HGB 12.7 11.7* 9.1*  HCT 42.5 40.7 30.9*  MCV 90.6 93.6 90.6  PLT 225 210 165    Basic Metabolic Panel: Recent Labs  Lab 07/24/23 1323 07/25/23 0403 07/26/23 0753  NA 141 142 138  K 3.7 4.3 3.5  CL 105 107 105  CO2 27 25 26   GLUCOSE 87 67* 80  BUN 11 11 9   CREATININE 0.67 0.55 0.44  CALCIUM 8.6* 8.5* 8.2*    CBG: Recent Labs  Lab 07/25/23 0647 07/26/23 0737  GLUCAP 159* 81    Recent Results (from the past 240 hours)  Blood Culture (routine x 2)     Status: None (Preliminary result)   Collection Time: 07/24/23  1:23 PM   Specimen: BLOOD  Result Value Ref Range Status   Specimen Description BLOOD LEFT ANTECUBITAL  Final   Special Requests   Final    BOTTLES DRAWN AEROBIC ONLY Blood Culture adequate volume   Culture   Final    NO GROWTH 2 DAYS Performed at [facility], [address], [place], [place] [address]    Report Status PENDING  Incomplete  Blood Culture (routine x 2)     Status: None (Preliminary result)   Collection Time: 07/24/23  1:29 PM   Specimen: BLOOD  Result Value Ref Range Status   Specimen Description BLOOD BLOOD LEFT FOREARM  Final   Special Requests   Final    BOTTLES DRAWN AEROBIC AND ANAEROBIC Blood Culture results may not be optimal due to an inadequate volume of blood received in culture bottles   Culture   Final    NO GROWTH 2 DAYS Performed at [facility], [address], [place], [place] [address]    Report Status PENDING   Incomplete  Resp panel by RT-PCR (RSV, Flu A&B, Covid) Anterior Nasal Swab     Status: Abnormal   Collection Time: 07/24/23  2:42 PM   Specimen: Anterior Nasal Swab  Result Value Ref Range Status   SARS Coronavirus 2 by RT PCR NEGATIVE NEGATIVE Final    Comment: (NOTE) SARS-CoV-2 target nucleic acids are NOT DETECTED.  The SARS-CoV-2 RNA is generally detectable in upper respiratory specimens during the acute phase of infection. The lowest concentration of SARS-CoV-2 viral copies this assay can detect is 138 copies/mL. A negative result does not preclude SARS-Cov-2 infection and should not be used as the sole basis for treatment or other patient management decisions. A negative result may occur with  improper specimen collection/handling, submission of specimen other than nasopharyngeal swab, presence of viral mutation(s) within the areas targeted by this assay, and inadequate number of viral copies(<138 copies/mL). A negative result must be combined with clinical observations, patient history, and epidemiological information. The expected result is Negative.  Fact Sheet for  Patients:  BloggerCourse.com  Fact Sheet for Healthcare Providers:  SeriousBroker.it  This test is no t yet approved or cleared by the [place] FDA and  has been authorized for detection and/or diagnosis of SARS-CoV-2 by FDA under an Emergency Use Authorization (EUA). This EUA will remain  in effect (meaning this test can be used) for the duration of the COVID-19 declaration under Section 564(b)(1) of the Act, 21 U.S.C.section 360bbb-3(b)(1), unless the authorization is terminated  or revoked sooner.       Influenza A by PCR POSITIVE (A) NEGATIVE Final   Influenza B by PCR NEGATIVE NEGATIVE Final    Comment: (NOTE) The Xpert Xpress SARS-CoV-2/FLU/RSV plus assay is intended as an aid in the diagnosis of influenza from Nasopharyngeal swab specimens  and should not be used as a sole basis for treatment. Nasal washings and aspirates are unacceptable for Xpert Xpress SARS-CoV-2/FLU/RSV testing.  Fact Sheet for Patients: BloggerCourse.com  Fact Sheet for Healthcare Providers: SeriousBroker.it  This test is not yet approved or cleared by the [place] FDA and has been authorized for detection and/or diagnosis of SARS-CoV-2 by FDA under an Emergency Use Authorization (EUA). This EUA will remain in effect (meaning this test can be used) for the duration of the COVID-19 declaration under Section 564(b)(1) of the Act, 21 U.S.C. section 360bbb-3(b)(1), unless the authorization is terminated or revoked.     Resp Syncytial Virus by PCR NEGATIVE NEGATIVE Final    Comment: (NOTE) Fact Sheet for Patients: BloggerCourse.com  Fact Sheet for Healthcare Providers: SeriousBroker.it  This test is not yet approved or cleared by the [place] FDA and has been authorized for detection and/or diagnosis of SARS-CoV-2 by FDA under an Emergency Use Authorization (EUA). This EUA will remain in effect (meaning this test can be used) for the duration of the COVID-19 declaration under Section 564(b)(1) of the Act, 21 U.S.C. section 360bbb-3(b)(1), unless the authorization is terminated or revoked.  Performed at [facility], [address], [place], [place] [address]   MRSA Next Gen by PCR, Nasal     Status: Abnormal   Collection Time: 07/25/23  1:20 PM   Specimen: Nasal Mucosa; Nasal Swab  Result Value Ref Range Status   MRSA by PCR Next Gen DETECTED (A) NOT DETECTED Final    Comment: RESULT CALLED TO, READ BACK BY AND VERIFIED WITH: BREANNA,RN ON 07/25/23 AT 1913 BY PURDIE,J        The GeneXpert MRSA Assay (FDA approved for NASAL specimens only), is one component of a comprehensive MRSA colonization surveillance program. It is  not intended to diagnose MRSA infection nor to guide or monitor treatment for MRSA infections. Performed at [facility], [address] [place], [place] [address]      Radiology Studies: DG Chest Portable 1 View Result Date: 07/25/2023 CLINICAL DATA:  PICC line placement EXAM: PORTABLE CHEST 1 VIEW COMPARISON:  Chest radiograph and CT chest, abdomen, and pelvis dated 07/24/2023. FINDINGS: Right PICC line tip overlies the lower SVC. Low lung volumes. Stable cardiomegaly. Aortic atherosclerosis. Similar indistinctness of the left costophrenic angle, likely correlates with prominent subpleural fat noted on the prior CT. No focal consolidation, sizeable pleural effusion, or pneumothorax. The visualized osseous structures are unchanged. IMPRESSION: 1. Right PICC line tip overlies the lower SVC. 2. Low lung volumes.  No acute cardiopulmonary findings. Electronically Signed   By: Hart Robinsons M.D.   On: 07/25/2023 13:11   [place] EKG SITE RITE Result Date: 07/25/2023 If Site Rite image not  attached, placement could not be confirmed due to current cardiac rhythm.  CT CHEST ABDOMEN PELVIS W CONTRAST Result Date: 07/24/2023 CLINICAL DATA:  Sepsis. Urinary tract infection. Previous appendectomy, hysterectomy, oophorectomy and cholecystectomy. EXAM: CT CHEST, ABDOMEN, AND PELVIS WITH CONTRAST TECHNIQUE: Multidetector CT imaging of the chest, abdomen and pelvis was performed following the standard protocol during bolus administration of intravenous contrast. RADIATION DOSE REDUCTION: This exam was performed according to the departmental dose-optimization program which includes automated exposure control, adjustment of the mA and/or kV according to patient size and/or use of iterative reconstruction technique. CONTRAST:  OMNIPAQUE IOHEXOL 300 MG/ML  SOLN COMPARISON:  Abdomen and pelvis CT dated 04/24/2018. Portable chest obtained earlier today. Chest CT report dated 05/15/2019. Chest CTA dated 10/18/2008.  FINDINGS: CT CHEST FINDINGS Cardiovascular: Stable mildly enlarged heart aortic valve calcifications and dense mitral valve annulus calcifications. Atheromatous calcifications, including the coronary arteries and aorta. The central pulmonary arteries remain mildly dilated with a main pulmonary artery diameter of 3.4 cm. Mediastinum/Nodes: No enlarged mediastinal, hilar, or axillary lymph nodes. Interval flattening of the trachea and mainstem bronchi with an interval decreased depth of inspiration. Unremarkable thyroid gland and esophagus. Lungs/Pleura: Decreased depth of inspiration. Mild bilateral lower lobe cylindrical bronchiectasis and associated mild parenchymal densities. Prominent subpleural fat on the left causing the blunting of the lateral costophrenic angle seen radiographically. No pleural fluid Musculoskeletal: Thoracic spine degenerative changes multiple thoracic vertebral compression deformities with mild bony retropulsion and no visible acute fracture lines. Associated acute kyphosis in the upper thoracic spine. CT ABDOMEN PELVIS FINDINGS Hepatobiliary: No focal liver abnormality is seen. Status post cholecystectomy. No biliary dilatation. Pancreas: Unremarkable. No pancreatic ductal dilatation or surrounding inflammatory changes. Spleen: Normal in size without focal abnormality. Adrenals/Urinary Tract: Normal-appearing adrenal glands multiple bilateral simple appearing renal cysts. These do not need imaging follow-up unremarkable urinary bladder and ureters Stomach/Bowel: Large number of sigmoid colon diverticula without evidence of diverticulitis. Breathing motion blurring obscuring the detail in the right lower quadrant of the abdomen. There is some fluid in gas in that region of the abdomen that is most likely intraluminal. However, due to the motion artifact, it is difficult to exclude a fluid or gas collection in that area. Surgically absent appendix by history. Unremarkable stomach.  Vascular/Lymphatic: Atheromatous arterial calcifications without aneurysm. No enlarged lymph nodes. Reproductive: Status post hysterectomy. No adnexal masses. Other: Anterior subcutaneous edema and anterior subcutaneous air, compatible with recent subcutaneous injection. Moderate-sized left inguinal hernia containing fat and small to moderate-sized right inguinal hernia containing fat Musculoskeletal: Lumbar spine degenerative changes. Multiple lumbar spine vertebral compression deformities that are new since 04/24/2018. Stable old T12 vertebral compression deformity. Possible acute fracture line in the anterior, superior corner of the L2 vertebral body and possible acute fracture line in the superior endplate of the L4 vertebral body. IMPRESSION: 1. Fluid and gas in the right lower quadrant of the abdomen which is most likely within the bowel. However, this can not be confirmed as intraluminal due to motion artifacts in that area. Correlation with the presence or absence of focal pain and tenderness in that area is recommended to help exclude the possibility of an abscess or contained perforation in the right lower quadrant of the abdomen. 2. Otherwise, no acute abnormality seen. 3. Decreased depth of inspiration with flattening of the trachea and mainstem bronchi, compatible with tracheobronchomalacia. 4. Mild bilateral lower lobe cylindrical bronchiectasis and associated mild parenchymal densities, most likely due to atelectasis. 5. Stable mild cardiomegaly. 6. Mildly dilated  central pulmonary arteries, suggesting pulmonary arterial hypertension. 7. Multiple thoracic and lumbar spine vertebral compression deformities, some of which are new since 04/24/2018. Possible acute fracture lines in the anterior, superior corner of the L2 vertebral body and superior endplate of the L4 vertebral body. 8. Sigmoid colon diverticulosis. 9. Moderate-sized left inguinal hernia containing fat and small to moderate-sized right  inguinal hernia containing fat. 10. Aortic atherosclerosis. Aortic Atherosclerosis (ICD10-I70.0). Electronically Signed   By: Beckie Salts M.D.   On: 07/24/2023 16:45   CT Head Wo Contrast Result Date: 07/24/2023 CLINICAL DATA:  Mental status change. Current therapy for urinary tract infection. EXAM: CT HEAD WITHOUT CONTRAST TECHNIQUE: Contiguous axial images were obtained from the base of the skull through the vertex without intravenous contrast. RADIATION DOSE REDUCTION: This exam was performed according to the departmental dose-optimization program which includes automated exposure control, adjustment of the mA and/or kV according to patient size and/or use of iterative reconstruction technique. COMPARISON:  01/05/2023 FINDINGS: Brain: No evidence of acute infarction, hemorrhage, hydrocephalus, extra-axial collection or mass lesion/mass effect. Vascular: Encephalomalacia within the right occipital and posterior temporal lobes consistent with remote PCA infarct. Ex vacuo dilatation of the right posterolateral ventricle noted. There is mild diffuse low-attenuation within the subcortical and periventricular white matter compatible with chronic microvascular disease. Skull: Normal. Negative for fracture or focal lesion. Sinuses/Orbits: Air-fluid level within the sphenoid sinus. Partial opacification of the ethmoid air cells. Other: None. IMPRESSION: 1. No acute intracranial abnormalities. 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 4. Air-fluid level within the sphenoid sinus. Correlate for any clinical signs or symptoms of acute sinusitis. Electronically Signed   By: Signa Kell M.D.   On: 07/24/2023 16:15   DG Chest Port 1 View Result Date: 07/24/2023 CLINICAL DATA:  Questionable sepsis - evaluate for abnormality. Weakness. Altered mental status. EXAM: PORTABLE CHEST 1 VIEW COMPARISON:  01/05/2023. FINDINGS: Redemonstration of blunting of left lateral costophrenic angle, similar to the  prior study, which may represent small left pleural effusion. There are probable associated atelectatic changes at the left lung base. Bilateral lung fields are otherwise clear. No acute consolidation or lung collapse. Right lateral costophrenic angle is clear. Stable cardio-mediastinal silhouette. No acute osseous abnormalities. The soft tissues are within normal limits. There are surgical clips in the right upper quadrant, typical of a previous cholecystectomy. IMPRESSION: *Persistent blunting of left lateral costophrenic angle, which may represent small left pleural effusion. *Otherwise no acute cardiopulmonary abnormality seen. Electronically Signed   By: Jules Schick M.D.   On: 07/24/2023 14:16    Scheduled Meds:  apixaban  5 mg Oral BID   bisacodyl  10 mg Rectal Daily   budesonide (PULMICORT) nebulizer solution  0.25 mg Nebulization BID   buPROPion  150 mg Oral Daily   Chlorhexidine Gluconate Cloth  6 each Topical Daily   diltiazem  120 mg Oral Daily   escitalopram  20 mg Oral Daily   hydrOXYzine  25 mg Oral BID   ipratropium  0.5 mg Nebulization TID   levalbuterol  0.63 mg Nebulization TID   levothyroxine  50 mcg Oral QAC breakfast   magnesium oxide  400 mg Oral Daily   montelukast  10 mg Oral Daily   oseltamivir  75 mg Oral BID   pantoprazole  40 mg Oral Daily   rosuvastatin  20 mg Oral Daily   sodium chloride flush  10-40 mL Intracatheter Q12H   sodium chloride flush  10-40 mL Intracatheter Q12H   Continuous  Infusions:  diltiazem (CARDIZEM) infusion Stopped (07/25/23 0857)   lactated ringers 60 mL/hr at 07/26/23 0647     LOS: 2 days   Critical Care Procedure Note Authorized and Performed by: Maryln Manuel MD  Total Critical Care time:  55 mins Due to a high probability of clinically significant, life threatening deterioration, the patient required my highest level of preparedness to intervene emergently and I personally spent this critical care time directly and personally  managing the patient.  This critical care time included obtaining a history; examining the patient, pulse oximetry; ordering and review of studies; arranging urgent treatment with development of a management plan; evaluation of patient's response of treatment; frequent reassessment; and discussions with other providers.  This critical care time was performed to assess and manage the high probability of imminent and life threatening deterioration that could result in multi-organ failure.  It was exclusive of separately billable procedures and treating other patients and teaching time.    Standley Dakins, MD How to contact the [facility] Attending or Consulting provider 7A - 7P or covering provider during after hours 7P -7A, for this patient?  Check the care team in [facility] and look for a) attending/consulting [facility] provider listed and b) the [facility] team listed Log into [email or web address] to find provider on call.  Locate the [facility] provider you are looking for under [facility] and page to a number that you can be directly reached. If you still have difficulty reaching the provider, please page the [facility] (Director on Call) for the Hospitalists listed on amion for assistance.  07/26/2023, 1:10 PM

## 2023-07-27 DIAGNOSIS — I4891 Unspecified atrial fibrillation: Secondary | ICD-10-CM | POA: Diagnosis not present

## 2023-07-27 DIAGNOSIS — J101 Influenza due to other identified influenza virus with other respiratory manifestations: Secondary | ICD-10-CM

## 2023-07-27 DIAGNOSIS — R935 Abnormal findings on diagnostic imaging of other abdominal regions, including retroperitoneum: Secondary | ICD-10-CM | POA: Diagnosis not present

## 2023-07-27 DIAGNOSIS — E118 Type 2 diabetes mellitus with unspecified complications: Secondary | ICD-10-CM | POA: Diagnosis not present

## 2023-07-27 LAB — CBC
HCT: 37.1 % (ref 36.0–46.0)
Hemoglobin: 10.9 g/dL — ABNORMAL LOW (ref 12.0–15.0)
MCH: 26.6 pg (ref 26.0–34.0)
MCHC: 29.4 g/dL — ABNORMAL LOW (ref 30.0–36.0)
MCV: 90.5 fL (ref 80.0–100.0)
Platelets: 177 10*3/uL (ref 150–400)
RBC: 4.1 MIL/uL (ref 3.87–5.11)
RDW: 16 % — ABNORMAL HIGH (ref 11.5–15.5)
WBC: 3.2 10*3/uL — ABNORMAL LOW (ref 4.0–10.5)
nRBC: 0 % (ref 0.0–0.2)

## 2023-07-27 LAB — BASIC METABOLIC PANEL
Anion gap: 6 (ref 5–15)
BUN: 6 mg/dL — ABNORMAL LOW (ref 8–23)
CO2: 30 mmol/L (ref 22–32)
Calcium: 8.5 mg/dL — ABNORMAL LOW (ref 8.9–10.3)
Chloride: 105 mmol/L (ref 98–111)
Creatinine, Ser: 0.47 mg/dL (ref 0.44–1.00)
GFR, Estimated: >60 mL/min (ref >60)
Glucose, Bld: 82 mg/dL (ref 70–99)
Potassium: 3.6 mmol/L (ref 3.5–5.1)
Sodium: 141 mmol/L (ref 135–145)

## 2023-07-27 MED ORDER — MUPIROCIN 2 % EX OINT
1.0000 | TOPICAL_OINTMENT | Freq: Two times a day (BID) | CUTANEOUS | Status: AC
  Administered 2023-07-28 – 2023-08-01 (×10): 1 via NASAL
  Filled 2023-07-27 (×2): qty 22

## 2023-07-27 NOTE — Progress Notes (Signed)
PROGRESS NOTE   [name]  KGU:[id number] DOB: [birth year] DOA: 07/24/2023 PCP: Merlyn Albert, MD (Inactive)   Chief Complaint  Patient presents with   Altered Mental Status   Level of care: Stepdown  Brief Admission History:  83 y.o. female, with a history of paroxysmal atrial fibrillation, diabetes mellitus type 2, hypertension, dysphagia, pulmonary embolus, hypothyroidism, and esophageal dysmotility, dementia, living at SNF for last 5 years, patient with advanced dementia at baseline, bedbound as discussed with family. -Patient was brought by ED secondary to altered mental status, increased lethargy, patient was noted to be altered since yesterday, apparently she is on antibiotics for UTI, as discussed with family, she is with advanced dementia, but usually communicative, remember some family members, bedbound at baseline.   -In ED she was altered, CT head with no acute findings, significant for chronic infarcts, CT abdomen pelvics with questionable right lower quadrant gas-fluid collection, likely within the bowels, but due to emotional artifact could not rule out abscess or perforated fluid collection, her respiratory panel significant for positive influenza A, [facility]ist consulted to admit   Assessment and Plan:  Acute metabolic encephalopathy -Most likely in setting of infectious process, including influenza -CT head with no acute findings -Per reports patient was treated for UTI at facility -palliative medicine consultation due to failure to thrive, advancing dementia, poor oral intake   Influenza infection -continue Tamiflu   A-fib with RVR -On apixaban for anticoagulation, ok to resume today per surgeon -currently off Cardizem drip and back on oral cardizem CD    Questionable right lower quadrant fluid collection, likely just just fluid-gas most likely within the bowel -CT abdomen showing fluid and gas in the right lower quadrant of the abdomen, possibly within  bowel, been difficult to confirm due to motion artifact , mentation to exclude possibility of abscess or contained perforation -Consulted general surgery - see consult notes  -advanced to clears diet per surgeon -ok to discontinue Zosyn per surgeon -ok to resume apixaban per surgeon -surgery signing off 2/2. Ok for diet; advanced to dys 1 on 2/2    History of PE -Resume apixaban today per surgeon   Dementia Deconditioning -Bedbound at baseline -Continue with supportive care   Hyperlipidemia -continue with statin   Hypothyroidism -Continue with Synthroid   Hypertension -Continue with Cardizem CD   Right forearm fluid collection likely hematoma -Is not warm or tender, likely related to hematoma from recent fall, ED physician attempted to aspirate only small amount of blood came out   DVT prophylaxis: SCDs Code Status: DNR  Family Communication: t/c to brother 1/31 no answer  Disposition: TBD    Consultants:  Surgery Palliative   Procedures:   Antimicrobials:    Subjective: Still with advanced dementia and poor communication skills.  Objective: Vitals:   07/27/23 0300 07/27/23 0526 07/27/23 0826 07/27/23 1154  BP: 111/62     Pulse: 74     Resp: 16     Temp:  (!) 97.4 F (36.3 C) (!) 96.5 F (35.8 C) 97.6 F (36.4 C)  TempSrc:  Axillary Axillary Oral  SpO2: 100%     Weight:  91 kg    Height:        Intake/Output Summary (Last 24 hours) at 07/27/2023 1156 Last data filed at 07/27/2023 0200 Gross per 24 hour  Intake 360 ml  Output 150 ml  Net 210 ml   Filed Weights   07/25/23 1400 07/26/23 0400 07/27/23 0526  Weight: 90.3 kg 91 kg  91 kg   Examination:  General exam: does not appear to be in distress; pt with advanced dementia.   Respiratory system: Clear to auscultation. Respiratory effort normal. Cardiovascular system: normal S1 & S2 heard. No JVD, murmurs, rubs, gallops or clicks. No pedal edema. Gastrointestinal system: Abdomen is obese, soft and NO  tenderness to palpation.  No organomegaly or masses felt. Normal bowel sounds heard. Central nervous system: Alert but disoriented. No focal neurological deficits. Extremities: large right forearm hematoma. Symmetric 5 x 5 power. Skin: No rashes, lesions or ulcers. Psychiatry: Judgement and insight appear diminished with severe dementia. Mood & affect flat.   Data Reviewed: I have personally reviewed following labs and imaging studies  CBC: Recent Labs  Lab 07/24/23 1323 07/25/23 0403 07/26/23 0753 07/27/23 0834  WBC 3.8* 4.6 4.4 3.2*  NEUTROABS 2.7  --   --   --   HGB 12.7 11.7* 9.1* 10.9*  HCT 42.5 40.7 30.9* 37.1  MCV 90.6 93.6 90.6 90.5  PLT 225 210 165 177    Basic Metabolic Panel: Recent Labs  Lab 07/24/23 1323 07/25/23 0403 07/26/23 0753 07/27/23 0834  NA 141 142 138 141  K 3.7 4.3 3.5 3.6  CL 105 107 105 105  CO2 27 25 26 30   GLUCOSE 87 67* 80 82  BUN 11 11 9  6*  CREATININE 0.67 0.55 0.44 0.47  CALCIUM 8.6* 8.5* 8.2* 8.5*    CBG: Recent Labs  Lab 07/25/23 0647 07/26/23 0737  GLUCAP 159* 81    Recent Results (from the past 240 hours)  Blood Culture (routine x 2)     Status: None (Preliminary result)   Collection Time: 07/24/23  1:23 PM   Specimen: BLOOD  Result Value Ref Range Status   Specimen Description BLOOD LEFT ANTECUBITAL  Final   Special Requests   Final    BOTTLES DRAWN AEROBIC ONLY Blood Culture adequate volume   Culture   Final    NO GROWTH 3 DAYS Performed at [facility], [address] [place], [place] [address]    Report Status PENDING  Incomplete  Blood Culture (routine x 2)     Status: None (Preliminary result)   Collection Time: 07/24/23  1:29 PM   Specimen: BLOOD  Result Value Ref Range Status   Specimen Description BLOOD BLOOD LEFT FOREARM  Final   Special Requests   Final    BOTTLES DRAWN AEROBIC AND ANAEROBIC Blood Culture results may not be optimal due to an inadequate volume of blood received in culture bottles    Culture   Final    NO GROWTH 3 DAYS Performed at [facility], [address], [place], [place] [address]    Report Status PENDING  Incomplete  Resp panel by RT-PCR (RSV, Flu A&B, Covid) Anterior Nasal Swab     Status: Abnormal   Collection Time: 07/24/23  2:42 PM   Specimen: Anterior Nasal Swab  Result Value Ref Range Status   SARS Coronavirus 2 by RT PCR NEGATIVE NEGATIVE Final    Comment: (NOTE) SARS-CoV-2 target nucleic acids are NOT DETECTED.  The SARS-CoV-2 RNA is generally detectable in upper respiratory specimens during the acute phase of infection. The lowest concentration of SARS-CoV-2 viral copies this assay can detect is 138 copies/mL. A negative result does not preclude SARS-Cov-2 infection and should not be used as the sole basis for treatment or other patient management decisions. A negative result may occur with  improper specimen collection/handling, submission of specimen other than nasopharyngeal swab,  presence of viral mutation(s) within the areas targeted by this assay, and inadequate number of viral copies(<138 copies/mL). A negative result must be combined with clinical observations, patient history, and epidemiological information. The expected result is Negative.  Fact Sheet for Patients:  BloggerCourse.com  Fact Sheet for Healthcare Providers:  SeriousBroker.it  This test is no t yet approved or cleared by the [place] FDA and  has been authorized for detection and/or diagnosis of SARS-CoV-2 by FDA under an Emergency Use Authorization (EUA). This EUA will remain  in effect (meaning this test can be used) for the duration of the COVID-19 declaration under Section 564(b)(1) of the Act, 21 U.S.C.section 360bbb-3(b)(1), unless the authorization is terminated  or revoked sooner.       Influenza A by PCR POSITIVE (A) NEGATIVE Final   Influenza B by PCR NEGATIVE NEGATIVE Final    Comment: (NOTE) The  Xpert Xpress SARS-CoV-2/FLU/RSV plus assay is intended as an aid in the diagnosis of influenza from Nasopharyngeal swab specimens and should not be used as a sole basis for treatment. Nasal washings and aspirates are unacceptable for Xpert Xpress SARS-CoV-2/FLU/RSV testing.  Fact Sheet for Patients: BloggerCourse.com  Fact Sheet for Healthcare Providers: SeriousBroker.it  This test is not yet approved or cleared by the [place] FDA and has been authorized for detection and/or diagnosis of SARS-CoV-2 by FDA under an Emergency Use Authorization (EUA). This EUA will remain in effect (meaning this test can be used) for the duration of the COVID-19 declaration under Section 564(b)(1) of the Act, 21 U.S.C. section 360bbb-3(b)(1), unless the authorization is terminated or revoked.     Resp Syncytial Virus by PCR NEGATIVE NEGATIVE Final    Comment: (NOTE) Fact Sheet for Patients: BloggerCourse.com  Fact Sheet for Healthcare Providers: SeriousBroker.it  This test is not yet approved or cleared by the [place] FDA and has been authorized for detection and/or diagnosis of SARS-CoV-2 by FDA under an Emergency Use Authorization (EUA). This EUA will remain in effect (meaning this test can be used) for the duration of the COVID-19 declaration under Section 564(b)(1) of the Act, 21 U.S.C. section 360bbb-3(b)(1), unless the authorization is terminated or revoked.  Performed at [facility], [address], [place], [place] [address]   MRSA Next Gen by PCR, Nasal     Status: Abnormal   Collection Time: 07/25/23  1:20 PM   Specimen: Nasal Mucosa; Nasal Swab  Result Value Ref Range Status   MRSA by PCR Next Gen DETECTED (A) NOT DETECTED Final    Comment: RESULT CALLED TO, READ BACK BY AND VERIFIED WITH: BREANNA,RN ON 07/25/23 AT 1913 BY PURDIE,J        The GeneXpert MRSA Assay  (FDA approved for NASAL specimens only), is one component of a comprehensive MRSA colonization surveillance program. It is not intended to diagnose MRSA infection nor to guide or monitor treatment for MRSA infections. Performed at [facility], [address] [place], [place] [address]      Radiology Studies: DG Chest Portable 1 View Result Date: 07/25/2023 CLINICAL DATA:  PICC line placement EXAM: PORTABLE CHEST 1 VIEW COMPARISON:  Chest radiograph and CT chest, abdomen, and pelvis dated 07/24/2023. FINDINGS: Right PICC line tip overlies the lower SVC. Low lung volumes. Stable cardiomegaly. Aortic atherosclerosis. Similar indistinctness of the left costophrenic angle, likely correlates with prominent subpleural fat noted on the prior CT. No focal consolidation, sizeable pleural effusion, or pneumothorax. The visualized osseous structures are unchanged. IMPRESSION: 1. Right PICC line tip  overlies the lower SVC. 2. Low lung volumes.  No acute cardiopulmonary findings. Electronically Signed   By: Hart Robinsons M.D.   On: 07/25/2023 13:11    Scheduled Meds:  apixaban  5 mg Oral BID   bisacodyl  10 mg Rectal Daily   budesonide (PULMICORT) nebulizer solution  0.25 mg Nebulization BID   buPROPion  150 mg Oral Daily   Chlorhexidine Gluconate Cloth  6 each Topical Daily   diltiazem  120 mg Oral Daily   escitalopram  20 mg Oral Daily   hydrOXYzine  25 mg Oral BID   ipratropium  0.5 mg Nebulization TID   levalbuterol  0.63 mg Nebulization TID   levothyroxine  50 mcg Oral QAC breakfast   magnesium oxide  400 mg Oral Daily   montelukast  10 mg Oral Daily   oseltamivir  75 mg Oral BID   pantoprazole  40 mg Oral Daily   rosuvastatin  20 mg Oral Daily   sodium chloride flush  10-40 mL Intracatheter Q12H   sodium chloride flush  10-40 mL Intracatheter Q12H   Continuous Infusions:  diltiazem (CARDIZEM) infusion Stopped (07/25/23 0857)     LOS: 3 days   Time spent: 57 mins   Dollie Mayse  Laural Benes, MD How to contact the [facility] Attending or Consulting provider 7A - 7P or covering provider during after hours 7P -7A, for this patient?  Check the care team in [facility] and look for a) attending/consulting [facility] provider listed and b) the [facility] team listed Log into [email or web address] to find provider on call.  Locate the [facility] provider you are looking for under [facility] and page to a number that you can be directly reached. If you still have difficulty reaching the provider, please page the [facility] (Director on Call) for the Hospitalists listed on amion for assistance.  07/27/2023, 11:56 AM

## 2023-07-27 NOTE — Evaluation (Signed)
Clinical/Bedside Swallow Evaluation Patient Details  Name: [name] MRN: [id number] Date of Birth: 07/18/1940  Today's Date: 07/27/2023 Time: SLP Start Time (ACUTE ONLY): 1330 SLP Stop Time (ACUTE ONLY): 1354 SLP Time Calculation (min) (ACUTE ONLY): 24 min  Past Medical History:  Past Medical History:  Diagnosis Date   Allergy    Arthritis    Asthma    CAD (coronary artery disease)    BMS and DES to LAD 2004; DES to D2 2005   Chronic pain of left knee    Depression    Diverticulitis of intestine    Essential hypertension    FH: colonic polyps    Adenomataous   GERD (gastroesophageal reflux disease)    History of kidney stones    Hyperlipidemia    Hypothyroidism    Insomnia    Irritable bowel syndrome    NSTEMI (non-ST elevated myocardial infarction) (HCC)    2005   Paroxysmal atrial fibrillation (HCC)    Pulmonary embolism (HCC)    2000 and 2010   Sleep apnea    Type 2 diabetes mellitus (HCC)    Venous stasis    Past Surgical History:  Past Surgical History:  Procedure Laterality Date   ABDOMINAL HYSTERECTOMY     APPENDECTOMY     BALLOON DILATION N/A 11/11/2013   Procedure: BALLOON DILATION;  Surgeon: Malissa Hippo, MD;  [facility];  Service: Endoscopy;  Laterality: N/A;   BREAST REDUCTION SURGERY     BREAST SURGERY     CHOLECYSTECTOMY     CLEFT PALATE REPAIR     4 surgeries   COLONOSCOPY  2011   negative   COLONOSCOPY N/A 05/10/2014   Procedure: COLONOSCOPY;  Surgeon: Malissa Hippo, MD;  [facility];  Service: Endoscopy;  Laterality: N/A;   ESOPHAGEAL DILATION N/A 03/08/2016   Procedure: ESOPHAGEAL DILATION;  Surgeon: Malissa Hippo, MD;  [facility];  Service: Endoscopy;  Laterality: N/A;   ESOPHAGEAL DILATION N/A 05/16/2019   Procedure: ESOPHAGEAL DILATION;  Surgeon: West Bali, MD;  [facility];  Service: Endoscopy;  Laterality: N/A;   ESOPHAGOGASTRODUODENOSCOPY N/A 11/11/2013   Procedure:  ESOPHAGOGASTRODUODENOSCOPY (EGD);  Surgeon: Malissa Hippo, MD;  [facility];  Service: Endoscopy;  Laterality: N/A;  200-moved to 100 Ann notified pt   ESOPHAGOGASTRODUODENOSCOPY N/A 05/09/2014   Procedure: ESOPHAGOGASTRODUODENOSCOPY (EGD);  Surgeon: Malissa Hippo, MD;  [facility];  Service: Endoscopy;  Laterality: N/A;   ESOPHAGOGASTRODUODENOSCOPY N/A 03/08/2016   Procedure: ESOPHAGOGASTRODUODENOSCOPY (EGD);  Surgeon: Malissa Hippo, MD;  [facility];  Service: Endoscopy;  Laterality: N/A;  2:20   ESOPHAGOGASTRODUODENOSCOPY N/A 05/16/2019   Procedure: ESOPHAGOGASTRODUODENOSCOPY (EGD);  Surgeon: West Bali, MD;  [facility];  Service: Endoscopy;  Laterality: N/A;   HEEL SPUR SURGERY     KNEE SURGERY     MALONEY DILATION N/A 11/11/2013   Procedure: MALONEY DILATION;  Surgeon: Malissa Hippo, MD;  [facility];  Service: Endoscopy;  Laterality: N/A;   OOPHORECTOMY     SAVORY DILATION N/A 11/11/2013   Procedure: SAVORY DILATION;  Surgeon: Malissa Hippo, MD;  [facility];  Service: Endoscopy;  Laterality: N/A;   TONSILLECTOMY     HPI:  [name] is a 83 y.o. female who presented to the hospital with altered mentation and lethargy.  She was recently being treated for UTI with antibiotics.  Her past medical history  significant for A-fib normally on Xarelto, diabetes, hypertension, PE, hypothyroidism, and advanced dementia and bedbound at baseline.  Her surgical history is significant for an appendectomy, hysterectomy, and cholecystectomy.  Patient is confused and not answering questions appropriately on exam, so history was obtained through EMR.  She has been intermittently tachycardic and tachypenic since admission.  She was noted to be flu positive.  A CT of the chest, abdomen, and pelvis was obtained in the emergency department.  This demonstrated a fluid and gas area in the right lower quadrant which is most likely  within the bowel lumen but cannot be confirmed secondary to motion artifact.  She has no leukocytosis and a normal lactic acid. Pt with extensive hx of esophageal issues including but not limited to impaired esophageal motility, mild narrowing of her GE junction, esophageal dilation, and esophagitis. BSE requested   Assessment / Plan / Recommendation  Clinical Impression  Clinical swallowing evaluation completed while Pt was sitting upright in bed. Pt was easily roused and consumed thin liquids and italian ice from clear liquid tray for SLP. Note occasional wet vocal quality cleared with reflexive repeat swallow but no coughing or immediate/orvert s/sx of aspiration noted. Pt with extensive esophageal dypshagia hx; defer diet recommendation to GI however, from oropharyngeal standpoint recommend advance to reg/thin when/if medically appropriate.  Per Nursing Pt is consuming very little PO. Note Pt is at increased risk for aspiration given fluctuating mentation, dependence on feeder and esophageal component of swallowing. Our service will follow and continue efforts acutely if indicated based on plan of care. Thank you, SLP Visit Diagnosis: Dysphagia, unspecified (R13.10)    Aspiration Risk  Moderate aspiration risk    Diet Recommendation Other (Comment) (Defer to GI)    Liquid Administration via: Cup;Straw Medication Administration: Crushed with puree Supervision: Full supervision/cueing for compensatory strategies Compensations: Slow rate;Small sips/bites Postural Changes: Seated upright at 90 degrees    Other  Recommendations Oral Care Recommendations: Oral care BID    Recommendations for follow up therapy are one component of a multi-disciplinary discharge planning process, led by the attending physician.  Recommendations may be updated based on patient status, additional functional criteria and insurance authorization.           Frequency and Duration min 1 x/week  1 week        Prognosis Prognosis for improved oropharyngeal function: Fair Barriers to Reach Goals: Cognitive deficits      Swallow Study   General Date of Onset: 07/24/23 HPI: [name] is a 83 y.o. female who presented to the hospital with altered mentation and lethargy.  She was recently being treated for UTI with antibiotics.  Her past medical history significant for A-fib normally on Xarelto, diabetes, hypertension, PE, hypothyroidism, and advanced dementia and bedbound at baseline.  Her surgical history is significant for an appendectomy, hysterectomy, and cholecystectomy.  Patient is confused and not answering questions appropriately on exam, so history was obtained through EMR.  She has been intermittently tachycardic and tachypenic since admission.  She was noted to be flu positive.  A CT of the chest, abdomen, and pelvis was obtained in the emergency department.  This demonstrated a fluid and gas area in the right lower quadrant which is most likely within the bowel lumen but cannot be confirmed secondary to motion artifact.  She has no leukocytosis and a normal lactic acid. Type of Study: Bedside Swallow Evaluation Previous Swallow Assessment: BSE 2020 Diet Prior to this Study: Clear liquid diet Temperature Spikes  Noted: No Respiratory Status: Room air History of Recent Intubation: No Behavior/Cognition: Alert;Cooperative;Pleasant mood Oral Cavity Assessment: Within Functional Limits Oral Care Completed by SLP: Recent completion by staff Oral Cavity - Dentition: Adequate natural dentition Vision: Functional for self-feeding Self-Feeding Abilities: Able to feed self Patient Positioning: Upright in bed Baseline Vocal Quality: Normal Volitional Cough: Strong Volitional Swallow: Able to elicit    Oral/Motor/Sensory Function Overall Oral Motor/Sensory Function: Within functional limits   Ice Chips Ice chips: Within functional limits   Thin Liquid Thin Liquid: Within functional limits     Nectar Thick Nectar Thick Liquid: Not tested   Honey Thick Honey Thick Liquid: Not tested   Puree Puree: Not tested   Solid     Solid: Not tested     Aldrich Lloyd H. Romie Levee, CCC-SLP Speech Language Pathologist  Georgetta Haber 07/27/2023,1:54 PM

## 2023-07-27 NOTE — [place] FL2 (Signed)
MEDICAID [id number] LEVEL OF CARE FORM     IDENTIFICATION  Patient Name: [name] Birthdate: [birth year] Sex: female Admission Date (Current Location): 07/24/2023  [place] Number:  Aaron Edelman [id number] T Facility and Address:  [facility],  [address] [place] [address]      Provider Number: [id number]  Attending Physician Name and Address:  Cleora Fleet, MD  Relative Name and Phone Number:       Current Level of Care: Hospital Recommended Level of Care: Skilled Nursing Facility Prior Approval Number:    Date Approved/Denied:   PASRR Number:    Discharge Plan: SNF    Current Diagnoses: Patient Active Problem List   Diagnosis Date Noted   Abnormal CT of the abdomen 07/25/2023   Altered mental status 07/25/2023   Acute encephalopathy 07/25/2023   Influenza A 07/25/2023   Atrial fibrillation with RVR (HCC) 07/24/2023   Gastritis and gastroduodenitis    Esophageal stricture    Odynophagia    Tonsillar abscess 08/13/2018   AF (paroxysmal atrial fibrillation) (HCC) 08/13/2018   Nausea vomiting and diarrhea 05/11/2016   Obesity 05/11/2016   CKD (chronic kidney disease), 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   Insomnia 04/15/2016   Hyperkalemia 04/08/2016   Volume depletion 04/08/2016   AKI (acute kidney injury) (HCC) 04/08/2016   Depression 04/07/2016   Nausea without vomiting 05/09/2014   Melena 05/08/2014   UGI bleed 05/08/2014   Dysphagia 10/26/2013   Difficulty walking 10/19/2012   Weakness of left leg 10/19/2012   Dysphagia 05/04/2012   Microcytic anemia 08/10/2011   Acute respiratory failure (HCC) 08/09/2011   IBS (irritable bowel syndrome) 08/09/2011   DOE (dyspnea on exertion) 03/05/2011   Anemia 03/05/2011   Peripheral neuropathy 10/02/2010   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   COLONIC POLYPS, ADENOMATOUS 12/05/2008   DIARRHEA, ACUTE 12/05/2008   Hypothyroidism 12/02/2008   Controlled type 2 diabetes mellitus (HCC) 12/02/2008    Hyperlipidemia LDL goal <70 12/02/2008   Essential hypertension 12/02/2008   Coronary atherosclerosis 12/02/2008   Chronic atrial fibrillation (HCC) 12/02/2008   Asthma 12/02/2008   GERD 12/02/2008   TOBACCO USE, QUIT 12/02/2008    Orientation RESPIRATION BLADDER Height & Weight     Self  O2 (3L) External catheter Weight: 200 lb 9.9 oz (91 kg) Height:  5\' 6"  (167.6 cm)  BEHAVIORAL SYMPTOMS/MOOD NEUROLOGICAL BOWEL NUTRITION STATUS      Incontinent Diet (See d/c summary)  AMBULATORY STATUS COMMUNICATION OF NEEDS Skin   Extensive Assist Verbally Bruising                       Personal Care Assistance Level of Assistance  Bathing, Feeding, Dressing Bathing Assistance: Maximum assistance Feeding assistance: Limited assistance Dressing Assistance: Maximum assistance     Functional Limitations Info  Sight, Hearing, Speech Sight Info: Impaired Hearing Info: Impaired Speech Info: Adequate    SPECIAL CARE FACTORS FREQUENCY                       Contractures      Additional Factors Info  Code Status, Allergies, Psychotropic, Isolation Precautions Code Status Info: DNR- Limited Allergies Info: Cefprozil, Demerol, Meperidine Hcl Psychotropic Info: Wellbutrin, Lexapro   Isolation Precautions Info: Droplet flu A+ 07/24/23     Current Medications (07/27/2023):  This is the current hospital active medication list Current Facility-Administered Medications  Medication Dose Route Frequency Provider Last Rate Last Admin   acetaminophen (TYLENOL) tablet 650 mg  650 mg Oral Q6H PRN Elgergawy, Leana Roe, MD       Or   acetaminophen (TYLENOL) suppository 650 mg  650 mg Rectal Q6H PRN Elgergawy, Leana Roe, MD       apixaban (ELIQUIS) tablet 5 mg  5 mg Oral BID Johnson, Clanford L, MD   5 mg at 07/27/23 1101   bisacodyl (DULCOLAX) suppository 10 mg  10 mg Rectal Daily Pappayliou, Catherine A, DO   10 mg at 07/27/23 1101   budesonide (PULMICORT) nebulizer solution 0.25 mg  0.25  mg Nebulization BID Johnson, Clanford L, MD   0.25 mg at 07/27/23 0734   buPROPion (WELLBUTRIN XL) 24 hr tablet 150 mg  150 mg Oral Daily Elgergawy, Leana Roe, MD   150 mg at 07/27/23 1101   Chlorhexidine Gluconate Cloth 2 % PADS 6 each  6 each Topical Daily Laural Benes, Clanford L, MD   6 each at 07/25/23 1503   diltiazem (CARDIZEM) tablet 120 mg  120 mg Oral Daily Johnson, Clanford L, MD   120 mg at 07/27/23 1100   escitalopram (LEXAPRO) tablet 20 mg  20 mg Oral Daily Elgergawy, Leana Roe, MD   20 mg at 07/27/23 1100   hydrALAZINE (APRESOLINE) injection 5 mg  5 mg Intravenous Q4H PRN Elgergawy, Leana Roe, MD       hydrOXYzine (ATARAX) tablet 25 mg  25 mg Oral BID Johnson, Clanford L, MD   25 mg at 07/27/23 1100   ipratropium (ATROVENT) nebulizer solution 0.5 mg  0.5 mg Nebulization TID Laural Benes, Clanford L, MD   0.5 mg at 07/27/23 1254   levalbuterol (XOPENEX) nebulizer solution 0.63 mg  0.63 mg Nebulization Q6H PRN Laural Benes, Clanford L, MD   0.63 mg at 07/26/23 0352   levalbuterol (XOPENEX) nebulizer solution 0.63 mg  0.63 mg Nebulization TID Laural Benes, Clanford L, MD   0.63 mg at 07/27/23 1254   levothyroxine (SYNTHROID) tablet 50 mcg  50 mcg Oral QAC breakfast Elgergawy, Leana Roe, MD   50 mcg at 07/27/23 6962   magnesium citrate solution 1 Bottle  1 Bottle Oral Once PRN Elgergawy, Leana Roe, MD       magnesium oxide (MAG-OX) tablet 400 mg  400 mg Oral Daily Elgergawy, Leana Roe, MD   400 mg at 07/27/23 1100   meclizine (ANTIVERT) tablet 25 mg  25 mg Oral TID PRN Elgergawy, Leana Roe, MD       montelukast (SINGULAIR) tablet 10 mg  10 mg Oral Daily Elgergawy, Leana Roe, MD   10 mg at 07/26/23 2153   oseltamivir (TAMIFLU) capsule 75 mg  75 mg Oral BID Elgergawy, Leana Roe, MD   75 mg at 07/27/23 1100   pantoprazole (PROTONIX) EC tablet 40 mg  40 mg Oral Daily Elgergawy, Leana Roe, MD   40 mg at 07/27/23 1101   polyethylene glycol (MIRALAX / GLYCOLAX) packet 17 g  17 g Oral Daily PRN Elgergawy, Leana Roe, MD        polyvinyl alcohol (LIQUIFILM TEARS) 1.4 % ophthalmic solution 1 drop  1 drop Both Eyes QID PRN Johnson, Clanford L, MD       rosuvastatin (CRESTOR) tablet 20 mg  20 mg Oral Daily Elgergawy, Leana Roe, MD   20 mg at 07/27/23 1101   sodium chloride flush (NS) 0.9 % injection 10-40 mL  10-40 mL Intracatheter Q12H Johnson, Clanford L, MD   10 mL at 07/26/23 2200   sodium chloride flush (NS) 0.9 % injection 10-40 mL  10-40 mL Intracatheter  PRN Johnson, Clanford L, MD       sodium chloride flush (NS) 0.9 % injection 10-40 mL  10-40 mL Intracatheter Q12H Johnson, Clanford L, MD   10 mL at 07/26/23 2200   sodium chloride flush (NS) 0.9 % injection 10-40 mL  10-40 mL Intracatheter PRN Cleora Fleet, MD         Discharge Medications: Please see discharge summary for a list of discharge medications.  Relevant Imaging Results:  Relevant Lab Results:   Additional Information    Karn Cassis, LCSW

## 2023-07-27 NOTE — Progress Notes (Signed)
[facility] Progress Note     Subjective: Patient seen and examined.  She is resting comfortably in bed.  She will awaken during examination but still does not respond appropriately to questions.  She has a small bowel movement documented in the chart.  She was able to tolerate her medications and some clear liquids overnight.  Objective: Vital signs in last 24 hours: Temp:  [96.5 F (35.8 C)-97.6 F (36.4 C)] 96.5 F (35.8 C) (02/02 0826) Pulse Rate:  [72-108] 74 (02/02 0300) Resp:  [15-19] 16 (02/02 0300) BP: (111-154)/(46-97) 111/62 (02/02 0300) SpO2:  [92 %-100 %] 100 % (02/02 0300) Weight:  [91 kg] 91 kg (02/02 0526) Last BM Date : 07/26/23  Intake/Output from previous day: 02/01 0701 - 02/02 0700 In: 360 [P.O.:360] Out: 150 [Urine:150] Intake/Output this shift: No intake/output data recorded.  General appearance: no distress and confused GI: Abdomen soft, nondistended, no percussion tenderness, nontender to palpation; no rigidity, guarding, rebound tenderness  Lab Results:  Recent Labs    07/26/23 0753 07/27/23 0834  WBC 4.4 3.2*  HGB 9.1* 10.9*  HCT 30.9* 37.1  PLT 165 177   BMET Recent Labs    07/26/23 0753 07/27/23 0834  NA 138 141  K 3.5 3.6  CL 105 105  CO2 26 30  GLUCOSE 80 82  BUN 9 6*  CREATININE 0.44 0.47  CALCIUM 8.2* 8.5*   PT/INR Recent Labs    07/24/23 1323  LABPROT 16.3*  INR 1.3*    Studies/Results: DG Chest Portable 1 View Result Date: 07/25/2023 CLINICAL DATA:  PICC line placement EXAM: PORTABLE CHEST 1 VIEW COMPARISON:  Chest radiograph and CT chest, abdomen, and pelvis dated 07/24/2023. FINDINGS: Right PICC line tip overlies the lower SVC. Low lung volumes. Stable cardiomegaly. Aortic atherosclerosis. Similar indistinctness of the left costophrenic angle, likely correlates with prominent subpleural fat noted on the prior CT. No focal consolidation, sizeable pleural effusion, or pneumothorax. The visualized  osseous structures are unchanged. IMPRESSION: 1. Right PICC line tip overlies the lower SVC. 2. Low lung volumes.  No acute cardiopulmonary findings. Electronically Signed   By: Hart Robinsons M.D.   On: 07/25/2023 13:11    Anti-infectives: Anti-infectives (From admission, onward)    Start     Dose/Rate Route Frequency Ordered Stop   07/25/23 1200  amoxicillin-clavulanate (AUGMENTIN) 875-125 MG per tablet 1 tablet  Status:  Discontinued        1 tablet Oral Every 12 hours 07/25/23 1046 07/25/23 1126   07/24/23 2200  oseltamivir (TAMIFLU) capsule 75 mg        75 mg Oral 2 times daily 07/24/23 2027 07/29/23 2159   07/24/23 2200  piperacillin-tazobactam (ZOSYN) IVPB 3.375 g  Status:  Discontinued        3.375 g 12.5 mL/hr over 240 Minutes Intravenous Every 8 hours 07/24/23 2029 07/25/23 1046       Assessment/Plan:  Patient is an 83 year old female who was admitted with altered mental status and flu.  General surgery was consulted for possible motion artifact on CT versus contained perforation/abscess.  -Patient continues to have a benign abdominal exam with stable vitals and no leukocytosis -Given patient's abdominal exam has become more benign without worsening hemodynamic status, suspect CT was all motion artifact -No need for antibiotics from surgical standpoint -Okay for diet from surgical standpoint -Okay for anticoagulation -Repeat CT of the abdomen and pelvis if there is concern for worsening abdominal exam -General Surgery to sign off.  Please call with  any questions or concerns -Care per primary team   LOS: 3 days    Adrean Heitz A Karess Harner 07/27/2023

## 2023-07-28 ENCOUNTER — Encounter (HOSPITAL_COMMUNITY): Payer: Self-pay | Admitting: Internal Medicine

## 2023-07-28 DIAGNOSIS — Z515 Encounter for palliative care: Secondary | ICD-10-CM | POA: Diagnosis not present

## 2023-07-28 DIAGNOSIS — I4891 Unspecified atrial fibrillation: Secondary | ICD-10-CM | POA: Diagnosis not present

## 2023-07-28 DIAGNOSIS — J101 Influenza due to other identified influenza virus with other respiratory manifestations: Secondary | ICD-10-CM | POA: Diagnosis not present

## 2023-07-28 DIAGNOSIS — Z7189 Other specified counseling: Secondary | ICD-10-CM | POA: Diagnosis not present

## 2023-07-28 DIAGNOSIS — E118 Type 2 diabetes mellitus with unspecified complications: Secondary | ICD-10-CM | POA: Diagnosis not present

## 2023-07-28 DIAGNOSIS — R935 Abnormal findings on diagnostic imaging of other abdominal regions, including retroperitoneum: Secondary | ICD-10-CM | POA: Diagnosis not present

## 2023-07-28 NOTE — Progress Notes (Signed)
2345 Pt froom the ICU. Trying to hit staff with the ppulse ox wire when moving from bed to bed. Alert but will not answer any of the orientation questions. Calmed down within 30 minutes. Resting quietly

## 2023-07-28 NOTE — Progress Notes (Signed)
PROGRESS NOTE   [name]  GNF:[id number] DOB: [birth year] DOA: 07/24/2023 PCP: Merlyn Albert, MD (Inactive)   Chief Complaint  Patient presents with   Altered Mental Status   Level of care: Telemetry  Brief Admission History:  83 y.o. female, with a history of paroxysmal atrial fibrillation, diabetes mellitus type 2, hypertension, dysphagia, pulmonary embolus, hypothyroidism, and esophageal dysmotility, dementia, living at SNF for last 5 years, patient with advanced dementia at baseline, bedbound as discussed with family. -Patient was brought by ED secondary to altered mental status, increased lethargy, patient was noted to be altered since yesterday, apparently she is on antibiotics for UTI, as discussed with family, she is with advanced dementia, but usually communicative, remember some family members, bedbound at baseline.   -In ED she was altered, CT head with no acute findings, significant for chronic infarcts, CT abdomen pelvics with questionable right lower quadrant gas-fluid collection, likely within the bowels, but due to emotional artifact could not rule out abscess or perforated fluid collection, her respiratory panel significant for positive influenza A, [facility] hospitalist consulted to admit   Assessment and Plan:  Acute metabolic encephalopathy -Most likely in setting of infectious process, including influenza -CT head with no acute findings -Per reports patient was treated for UTI at facility -palliative medicine consultation due to failure to thrive, advancing dementia, poor oral intake   Influenza infection -continue Tamiflu   A-fib with RVR - resolved  -On apixaban for anticoagulation, ok to resume today per surgeon -currently off Cardizem drip and back on oral cardizem CD    Questionable right lower quadrant fluid collection, likely just just fluid-gas most likely within the bowel -CT abdomen showing fluid and gas in the right lower quadrant of the abdomen,  possibly within bowel, been difficult to confirm due to motion artifact , mentation to exclude possibility of abscess or contained perforation -Consulted general surgery - see consult notes  -advanced to clears diet per surgeon -ok to discontinue Zosyn per surgeon -ok to resume apixaban per surgeon -surgery signing off 2/2. Ok for diet; advanced to dys 1 on 2/2    History of PE -Resume apixaban today per surgeon   Dementia Deconditioning -Bedbound at baseline -Continue with supportive care   Hyperlipidemia -continue with statin   Hypothyroidism -Continue with Synthroid   Hypertension -Continue with Cardizem CD   Right forearm fluid collection likely hematoma -Is not warm or tender, likely related to hematoma from recent fall, ED physician attempted to aspirate only small amount of blood came out   DVT prophylaxis: SCDs Code Status: DNR  Family Communication: t/c to brother 1/31 no answer  Disposition: TBD  pending palliative discussions    Consultants:  Surgery Palliative   Procedures:   Antimicrobials:    Subjective: A little less agitated but persistent advanced dementia and poor communication.  Objective: Vitals:   07/27/23 2217 07/27/23 2351 07/28/23 0501 07/28/23 0759  BP: (!) 139/118 (!) 147/99 (!) 131/93   Pulse: (!) 112 95 81   Resp: 18 19 17    Temp: 98.4 F (36.9 C)     TempSrc:      SpO2: 96%  96% 96%  Weight:      Height:        Intake/Output Summary (Last 24 hours) at 07/28/2023 1136 Last data filed at 07/28/2023 0900 Gross per 24 hour  Intake 240 ml  Output 450 ml  Net -210 ml   Filed Weights   07/25/23 1400 07/26/23 0400 07/27/23 0526  Weight: 90.3 kg 91 kg 91 kg   Examination:  General exam: does not appear to be in distress; pt with advanced dementia. Mostly nonverbal but will smile and nod at times.  Respiratory system: Clear to auscultation. Respiratory effort normal. Cardiovascular system: normal S1 & S2 heard. No JVD, murmurs,  rubs, gallops or clicks. No pedal edema. Gastrointestinal system: Abdomen is obese, soft and NO tenderness to palpation.  No organomegaly or masses felt. Normal bowel sounds heard. Central nervous system: Alert but disoriented. No focal neurological deficits. Extremities: large right forearm hematoma. Symmetric 5 x 5 power. Skin: No rashes, lesions or ulcers. Psychiatry: Judgement and insight appear diminished with severe dementia. Mood & affect flat.   Data Reviewed: I have personally reviewed following labs and imaging studies  CBC: Recent Labs  Lab 07/24/23 1323 07/25/23 0403 07/26/23 0753 07/27/23 0834  WBC 3.8* 4.6 4.4 3.2*  NEUTROABS 2.7  --   --   --   HGB 12.7 11.7* 9.1* 10.9*  HCT 42.5 40.7 30.9* 37.1  MCV 90.6 93.6 90.6 90.5  PLT 225 210 165 177    Basic Metabolic Panel: Recent Labs  Lab 07/24/23 1323 07/25/23 0403 07/26/23 0753 07/27/23 0834  NA 141 142 138 141  K 3.7 4.3 3.5 3.6  CL 105 107 105 105  CO2 27 25 26 30   GLUCOSE 87 67* 80 82  BUN 11 11 9  6*  CREATININE 0.67 0.55 0.44 0.47  CALCIUM 8.6* 8.5* 8.2* 8.5*    CBG: Recent Labs  Lab 07/25/23 0647 07/26/23 0737  GLUCAP 159* 81    Recent Results (from the past 240 hours)  Blood Culture (routine x 2)     Status: None (Preliminary result)   Collection Time: 07/24/23  1:23 PM   Specimen: BLOOD  Result Value Ref Range Status   Specimen Description BLOOD LEFT ANTECUBITAL  Final   Special Requests   Final    BOTTLES DRAWN AEROBIC ONLY Blood Culture adequate volume   Culture   Final    NO GROWTH 3 DAYS Performed at [facility], [address], [place], [place] [address]    Report Status PENDING  Incomplete  Blood Culture (routine x 2)     Status: None (Preliminary result)   Collection Time: 07/24/23  1:29 PM   Specimen: BLOOD  Result Value Ref Range Status   Specimen Description BLOOD BLOOD LEFT FOREARM  Final   Special Requests   Final    BOTTLES DRAWN AEROBIC AND ANAEROBIC Blood Culture  results may not be optimal due to an inadequate volume of blood received in culture bottles   Culture   Final    NO GROWTH 3 DAYS Performed at [facility], [address] [place], [place] [address]    Report Status PENDING  Incomplete  Resp panel by RT-PCR (RSV, Flu A&B, Covid) Anterior Nasal Swab     Status: Abnormal   Collection Time: 07/24/23  2:42 PM   Specimen: Anterior Nasal Swab  Result Value Ref Range Status   SARS Coronavirus 2 by RT PCR NEGATIVE NEGATIVE Final    Comment: (NOTE) SARS-CoV-2 target nucleic acids are NOT DETECTED.  The SARS-CoV-2 RNA is generally detectable in upper respiratory specimens during the acute phase of infection. The lowest concentration of SARS-CoV-2 viral copies this assay can detect is 138 copies/mL. A negative result does not preclude SARS-Cov-2 infection and should not be used as the sole basis for treatment or other patient management decisions. A negative result may  occur with  improper specimen collection/handling, submission of specimen other than nasopharyngeal swab, presence of viral mutation(s) within the areas targeted by this assay, and inadequate number of viral copies(<138 copies/mL). A negative result must be combined with clinical observations, patient history, and epidemiological information. The expected result is Negative.  Fact Sheet for Patients:  BloggerCourse.com  Fact Sheet for Healthcare Providers:  SeriousBroker.it  This test is no t yet approved or cleared by the [place] FDA and  has been authorized for detection and/or diagnosis of SARS-CoV-2 by FDA under an Emergency Use Authorization (EUA). This EUA will remain  in effect (meaning this test can be used) for the duration of the COVID-19 declaration under Section 564(b)(1) of the Act, 21 U.S.C.section 360bbb-3(b)(1), unless the authorization is terminated  or revoked sooner.       Influenza A by PCR  POSITIVE (A) NEGATIVE Final   Influenza B by PCR NEGATIVE NEGATIVE Final    Comment: (NOTE) The Xpert Xpress SARS-CoV-2/FLU/RSV plus assay is intended as an aid in the diagnosis of influenza from Nasopharyngeal swab specimens and should not be used as a sole basis for treatment. Nasal washings and aspirates are unacceptable for Xpert Xpress SARS-CoV-2/FLU/RSV testing.  Fact Sheet for Patients: BloggerCourse.com  Fact Sheet for Healthcare Providers: SeriousBroker.it  This test is not yet approved or cleared by the [place] FDA and has been authorized for detection and/or diagnosis of SARS-CoV-2 by FDA under an Emergency Use Authorization (EUA). This EUA will remain in effect (meaning this test can be used) for the duration of the COVID-19 declaration under Section 564(b)(1) of the Act, 21 U.S.C. section 360bbb-3(b)(1), unless the authorization is terminated or revoked.     Resp Syncytial Virus by PCR NEGATIVE NEGATIVE Final    Comment: (NOTE) Fact Sheet for Patients: BloggerCourse.com  Fact Sheet for Healthcare Providers: SeriousBroker.it  This test is not yet approved or cleared by the [place] FDA and has been authorized for detection and/or diagnosis of SARS-CoV-2 by FDA under an Emergency Use Authorization (EUA). This EUA will remain in effect (meaning this test can be used) for the duration of the COVID-19 declaration under Section 564(b)(1) of the Act, 21 U.S.C. section 360bbb-3(b)(1), unless the authorization is terminated or revoked.  Performed at [facility], [address], [place], [place] [address]   MRSA Next Gen by PCR, Nasal     Status: Abnormal   Collection Time: 07/25/23  1:20 PM   Specimen: Nasal Mucosa; Nasal Swab  Result Value Ref Range Status   MRSA by PCR Next Gen DETECTED (A) NOT DETECTED Final    Comment: RESULT CALLED TO, READ BACK BY AND  VERIFIED WITH: BREANNA,RN ON 07/25/23 AT 1913 BY PURDIE,J        The GeneXpert MRSA Assay (FDA approved for NASAL specimens only), is one component of a comprehensive MRSA colonization surveillance program. It is not intended to diagnose MRSA infection nor to guide or monitor treatment for MRSA infections. Performed at [facility], [address] [place], [place] [address]      Radiology Studies: No results found.   Scheduled Meds:  apixaban  5 mg Oral BID   bisacodyl  10 mg Rectal Daily   budesonide (PULMICORT) nebulizer solution  0.25 mg Nebulization BID   buPROPion  150 mg Oral Daily   Chlorhexidine Gluconate Cloth  6 each Topical Daily   diltiazem  120 mg Oral Daily   escitalopram  20 mg Oral Daily   hydrOXYzine  25  mg Oral BID   ipratropium  0.5 mg Nebulization TID   levalbuterol  0.63 mg Nebulization TID   levothyroxine  50 mcg Oral QAC breakfast   magnesium oxide  400 mg Oral Daily   montelukast  10 mg Oral Daily   mupirocin ointment  1 Application Nasal BID   oseltamivir  75 mg Oral BID   pantoprazole  40 mg Oral Daily   rosuvastatin  20 mg Oral Daily   sodium chloride flush  10-40 mL Intracatheter Q12H   sodium chloride flush  10-40 mL Intracatheter Q12H   Continuous Infusions:   LOS: 4 days   Time spent: 55 mins   Tionne Dayhoff Laural Benes, MD How to contact the [facility] Attending or Consulting provider 7A - 7P or covering provider during after hours 7P -7A, for this patient?  Check the care team in [facility] and look for a) attending/consulting [facility] provider listed and b) the [facility] team listed Log into [email or web address] to find provider on call.  Locate the [facility] provider you are looking for under [facility] and page to a number that you can be directly reached. If you still have difficulty reaching the provider, please page the [facility] (Director on Call) for the Hospitalists listed on amion for assistance.  07/28/2023, 11:36 AM

## 2023-07-28 NOTE — Consult Note (Signed)
Consultation Note Date: 07/28/2023   Patient Name: [name]  DOB: Nov 07, 1940  MRN: [id number]  Age / Sex: 83 y.o., female  PCP: Merlyn Albert, MD (Inactive) Referring Physician: Cleora Fleet, MD  Reason for Consultation: Establishing goals of care  HPI/Patient Profile: 83 y.o. female  with past medical history of advanced dementia skilled nursing facility for 5 years, bedbound, dysphagia/esophageal dysmotility, DM2, HTN, history of PE, hypothyroid, apparently on antibiotic for UTI admitted on 07/24/2023 with acute metabolic encephalopathy most likely in setting of infectious process, flu A.   Clinical Assessment and Goals of Care: I have reviewed medical records including EPIC notes, labs and imaging, received report from RN, assessed the patient.  [name] is sitting up in bed.  She is alert, making and keeping eye contact when I enter.  She has known dementia and is unable to tell me her name or where we are.  I do believe that she can make her basic needs known.  There is no family at bedside at this time.  Face-to-face discussion with bedside nursing staff outside of room.  Call to brother/HCPOA, [name], to discuss diagnosis prognosis, GOC, EOL wishes, disposition and options. I introduced Palliative Medicine as specialized medical care for people living with serious illness. It focuses on providing relief from the symptoms and stress of a serious illness. The goal is to improve quality of life for both the patient and the family.  We discussed a brief life review of the patient.  Brother [name] shares that [name] has no children and her spouse has been deceased for several years.  She has been a resident of [facility] for approximately 5 years, bedbound for about 3 years.  He states that she has been experiencing progressive memory loss.  She also is extremely hard of  hearing.  We then focused on their current illness.  At first, [name] is very concerned about the knot on her right arm.  I shared that [name] came with this and we are not sure what happened.  He states that his sister did not fall, this not was not they are when he visited 1 month ago.  Initially, he stated he wanted a biopsy.  After further conversation he shared that he would have to consider if he would even want cancer treatment if she did have cancer.   We talked in detail about her acute metabolic encephalopathy, UTI that would be expected to be recurrent due to her bedbound status, flu with the possibility of having pneumonia afterwards, PE and the treatment plan.  A-fib with RVR discussed, along with questionable right lower quadrant fluid collection that seems to be benign at this point the natural disease trajectory and expectations at EOL were discussed.  Advanced directives, concepts specific to code status, artifical feeding and hydration, and rehospitalization were considered and discussed.   Hospice and Palliative Care services outpatient were explained and offered.  We talked about the benefits of palliative/hospice care for continued support.  Initially,  Mr. Sabino Niemann states this sounds like a good plan.  I share that we will continue discussions.  PMT to follow.  Discussed the importance of continued conversation with family and the medical providers regarding overall plan of care and treatment options, ensuring decisions are within the context of the patient's values and GOCs.  Questions and concerns were addressed.   The family was encouraged to call with questions or concerns.  PMT will continue to support holistically.  Conference with attending, bedside nursing staff, transition of care team related to patient condition, needs, goals of care, disposition.    HCPOA NEXT OF KIN -brother, [name].  [name] had no children and her husband has been deceased for  several years.    SUMMARY OF RECOMMENDATIONS   At this point continue to treat the treatable but no CPR or intubation Time for outcomes Anticipate return to long-term care at [facility] where she has been for 5 years Brother is considering outpatient palliative versus "treat the treatable" hospice care   Code Status/Advance Care Planning: DNR -brother states that this was [name]'s choice.  Symptom Management:  Per hospitalist, no additional needs at this time.  Palliative Prophylaxis:  Frequent Pain Assessment, Oral Care, and Turn Reposition  Additional Recommendations (Limitations, Scope, Preferences): Continue to treatment no CPR or intubation  Psycho-social/Spiritual:  Desire for further Chaplaincy support:no Additional Recommendations: Caregiving  Support/Resources and Education on Hospice  Prognosis:  Unable to determine, based on outcomes.  Guarded at this point.  Flu positive, bedbound status for 3 years, pneumonia not unanticipated.  Discharge Planning: Anticipate return to [facility] where she has been for the last 5 years, bedbound status x 3 years.      Primary Diagnoses: Present on Admission:  Atrial fibrillation with RVR (HCC)  AF (paroxysmal atrial fibrillation) (HCC)  CKD (chronic kidney disease), stage II  Coronary atherosclerosis  Obesity   I have reviewed the medical record, interviewed the patient and family, and examined the patient. The following aspects are pertinent.  Past Medical History:  Diagnosis Date   Allergy    Arthritis    Asthma    CAD (coronary artery disease)    BMS and DES to LAD 2004; DES to D2 2005   Chronic pain of left knee    Depression    Diverticulitis of intestine    Essential hypertension    FH: colonic polyps    Adenomataous   GERD (gastroesophageal reflux disease)    History of kidney stones    Hyperlipidemia    Hypothyroidism    Insomnia    Irritable bowel syndrome    NSTEMI (non-ST elevated  myocardial infarction) (HCC)    2005   Paroxysmal atrial fibrillation (HCC)    Pulmonary embolism (HCC)    2000 and 2010   Sleep apnea    Type 2 diabetes mellitus (HCC)    Venous stasis    Social History   Socioeconomic History   Marital status: Widowed    Spouse name: Not on file   Number of children: Not on file   Years of education: Not on file   Highest education level: Not on file  Occupational History   Occupation: retired    Associate Professor: RETIRED  Tobacco Use   Smoking status: Former    Current packs/day: 2.00    Average packs/day: 2.0 packs/day for 36.2 years (72.5 ttl pk-yrs)    Types: Cigarettes    Start date: 05/05/1987   Smokeless tobacco: Never  Vaping  Use   Vaping status: Never Used  Substance and Sexual Activity   Alcohol use: No    Alcohol/week: 0.0 standard drinks of alcohol   Drug use: No   Sexual activity: Not on file  Other Topics Concern   Not on file  Social History Narrative   Not on file   Social Drivers of Health   Financial Resource Strain: Not on file  Food Insecurity: No Food Insecurity (07/25/2023)   Hunger Vital Sign    Worried About Running Out of Food in the Last Year: Never true    Ran Out of Food in the Last Year: Never true  Transportation Needs: Patient Unable To Answer (07/25/2023)   PRAPARE - Transportation    Lack of Transportation (Medical): Patient unable to answer    Lack of Transportation (Non-Medical): Patient unable to answer  Physical Activity: Not on file  Stress: Not on file  Social Connections: Patient Unable To Answer (07/25/2023)   Social Connection and Isolation Panel [NHANES]    Frequency of Communication with Friends and Family: Patient unable to answer    Frequency of Social Gatherings with Friends and Family: Patient unable to answer    Attends Religious Services: Patient unable to answer    Active Member of Clubs or Organizations: Patient unable to answer    Attends Banker Meetings: Patient  unable to answer    Marital Status: Patient unable to answer   Family History  Problem Relation Age of Onset   Heart attack Mother    Diabetes Mother    Hyperlipidemia Mother    Heart attack Father    Lung cancer Sister    Lymphoma Brother    Colon cancer Neg Hx    Scheduled Meds:  apixaban  5 mg Oral BID   bisacodyl  10 mg Rectal Daily   budesonide (PULMICORT) nebulizer solution  0.25 mg Nebulization BID   buPROPion  150 mg Oral Daily   Chlorhexidine Gluconate Cloth  6 each Topical Daily   diltiazem  120 mg Oral Daily   escitalopram  20 mg Oral Daily   hydrOXYzine  25 mg Oral BID   ipratropium  0.5 mg Nebulization TID   levalbuterol  0.63 mg Nebulization TID   levothyroxine  50 mcg Oral QAC breakfast   magnesium oxide  400 mg Oral Daily   montelukast  10 mg Oral Daily   mupirocin ointment  1 Application Nasal BID   oseltamivir  75 mg Oral BID   pantoprazole  40 mg Oral Daily   rosuvastatin  20 mg Oral Daily   sodium chloride flush  10-40 mL Intracatheter Q12H   sodium chloride flush  10-40 mL Intracatheter Q12H   Continuous Infusions: PRN Meds:.acetaminophen **OR** acetaminophen, hydrALAZINE, levalbuterol, magnesium citrate, meclizine, polyethylene glycol, polyvinyl alcohol, sodium chloride flush, sodium chloride flush Medications Prior to Admission:  Prior to Admission medications   Medication Sig Start Date End Date Taking? Authorizing Provider  acetaminophen (TYLENOL) 325 MG tablet Take 650 mg by mouth every 6 (six) hours as needed for mild pain (pain score 1-3).   Yes [provider]  acetaminophen (TYLENOL) 500 MG tablet Take 500 mg by mouth daily as needed for headache.   Yes [provider]  albuterol (PROVENTIL) (2.5 MG/3ML) 0.083% nebulizer solution Take 3 mLs (2.5 mg total) by nebulization every 6 (six) hours as needed for wheezing or shortness of breath. 06/05/16  Yes Merlyn Albert, MD  albuterol (VENTOLIN HFA) 108 (90 Base) MCG/ACT  inhaler  Inhale 2 puffs into the lungs in the morning, at noon, in the evening, and at bedtime.   Yes [provider]  bisacodyl (DULCOLAX) 5 MG EC tablet Take 10 mg by mouth daily as needed for moderate constipation.   Yes [provider]  buPROPion (WELLBUTRIN XL) 150 MG 24 hr tablet Take 150 mg by mouth daily. 07/07/23  Yes [provider]  cefTRIAXone (ROCEPHIN) 1 g injection Inject 1 g into the muscle once.   Yes [provider]  Cholecalciferol (VITAMIN D3) 1000 units CAPS Take 1 capsule every evening by mouth.    Yes [provider]  ciprofloxacin (CIPRO) 500 MG tablet Take 500 mg by mouth 2 (two) times daily. 07/22/23  Yes [provider]  conjugated estrogens (PREMARIN) vaginal cream Place 1 Applicatorful vaginally 2 (two) times a week. On Mondays and Fridays   Yes [provider]  diltiazem (CARDIZEM) 120 MG tablet Take 120 mg by mouth daily. 07/07/23  Yes [provider]  ELIQUIS 5 MG TABS tablet Take 5 mg by mouth 2 (two) times daily. 07/07/23  Yes [provider]  escitalopram (LEXAPRO) 10 MG tablet Take 20 mg by mouth daily. 07/05/23  Yes [provider]  ferrous sulfate 325 (65 FE) MG tablet Take 325 mg by mouth daily with breakfast.   Yes [provider]  FLOVENT DISKUS 100 MCG/BLIST AEPB Inhale 2 puffs into the lungs in the morning and at bedtime. 01/06/20  Yes [provider]  furosemide (LASIX) 40 MG tablet TAKE ONE TABLET BY MOUTH DAILY. 08/18/18  Yes Laqueta Linden, MD  hydrOXYzine (ATARAX) 25 MG tablet Take 25 mg by mouth 2 (two) times daily. 07/10/23  Yes [provider]  levothyroxine (SYNTHROID) 50 MCG tablet Take 50 mcg by mouth daily before breakfast.   Yes [provider]  magnesium oxide (MAG-OX) 400 MG tablet Take 400 mg by mouth daily.   Yes [provider]  montelukast (SINGULAIR) 10 MG tablet Take 10 mg by mouth daily. 07/07/23  Yes [provider]  nitroGLYCERIN (NITROLINGUAL) 0.4 MG/SPRAY spray Place 1 spray under the tongue every 5 (five) minutes x 3 doses as needed for chest pain. 06/04/16  Yes Laqueta Linden, MD  pantoprazole (PROTONIX) 40 MG tablet Take 1 tablet (40 mg total) by mouth 2 (two) times daily before a meal. X 1 month, then once daily forever Patient taking differently: Take 40 mg by mouth daily. X 1 month, then once daily forever 05/21/19  Yes Tat, Onalee Hua, MD  polyvinyl alcohol (LIQUIFILM TEARS) 1.4 % ophthalmic solution Place 1 drop into both eyes 4 (four) times daily as needed for dry eyes.   Yes [provider]  Potassium Chloride ER 20 MEQ TBCR One po daily Patient taking differently: Take 1 tablet by mouth daily. One po daily 08/15/17  Yes Merlyn Albert, MD  Soft Lens Products (SALINE) SOLN Take 60 mL/hr by mouth every 3 (three) days. 2L   Yes [provider]  triamcinolone cream (KENALOG) 0.1 % Apply 1 application topically 2 (two) times daily. 08/18/18  Yes Merlyn Albert, MD  azithromycin (ZITHROMAX) 250 MG tablet Take 1 tablet (250 mg total) by mouth daily. Take first 2 tablets together, then 1 every day until finished. 01/05/23   Peter Garter, PA  levofloxacin (LEVAQUIN) 500 MG tablet Take 500 mg by mouth daily.    [provider]   Allergies  Allergen Reactions   Cefprozil  Nausea And Vomiting    Unknown   Demerol Nausea And Vomiting   Meperidine Hcl Other (See Comments)    Unknown   Review of Systems  Unable to perform ROS: Dementia    Physical Exam Vitals and nursing note reviewed.  Constitutional:      General: She is not in acute distress.    Appearance: She is obese. She is ill-appearing.  HENT:     Mouth/Throat:     Mouth: Mucous membranes are dry.  Cardiovascular:     Rate and Rhythm: Normal rate.  Pulmonary:     Effort: Pulmonary effort is normal. No respiratory distress.  Skin:    General: Skin is warm and dry.  Neurological:      Mental Status: She is alert.     Comments: Dementia   Psychiatric:     Comments: Calm and cooperative, not fearful      Vital Signs: BP 119/67 (BP Location: Left Leg)   Pulse 82   Temp 98.4 F (36.9 C) (Oral)   Resp 14   Ht 5\' 6"  (1.676 m)   Wt 91 kg   SpO2 94%   BMI 32.38 kg/m  Pain Scale: CPOT POSS *See Group Information*: S-Acceptable,Sleep, easy to arouse Pain Score: 0-No pain   SpO2: SpO2: 94 % O2 Device:SpO2: 94 % O2 Flow Rate: .O2 Flow Rate (L/min): 3 L/min  IO: Intake/output summary:  Intake/Output Summary (Last 24 hours) at 07/28/2023 1307 Last data filed at 07/28/2023 0900 Gross per 24 hour  Intake 240 ml  Output 450 ml  Net -210 ml    LBM: Last BM Date : 07/27/23 Baseline Weight: Weight: 117.9 kg Most recent weight: Weight: 91 kg     Palliative Assessment/Data:     Time In: 1010 Time Out: 1125 Time Total: 75 minutes  Greater than 50%  of this time was spent counseling and coordinating care related to the above assessment and plan.  Signed by: Katheran Awe, NP   Please contact Palliative Medicine Team phone at [phone] for questions and concerns.  For individual provider: See Loretha Stapler

## 2023-07-29 ENCOUNTER — Inpatient Hospital Stay (HOSPITAL_COMMUNITY): Payer: Medicare (Managed Care)

## 2023-07-29 DIAGNOSIS — I4891 Unspecified atrial fibrillation: Secondary | ICD-10-CM

## 2023-07-29 DIAGNOSIS — Z515 Encounter for palliative care: Secondary | ICD-10-CM | POA: Diagnosis not present

## 2023-07-29 DIAGNOSIS — G9340 Encephalopathy, unspecified: Secondary | ICD-10-CM | POA: Diagnosis not present

## 2023-07-29 DIAGNOSIS — J101 Influenza due to other identified influenza virus with other respiratory manifestations: Secondary | ICD-10-CM | POA: Diagnosis not present

## 2023-07-29 DIAGNOSIS — Z7189 Other specified counseling: Secondary | ICD-10-CM | POA: Diagnosis not present

## 2023-07-29 LAB — CULTURE, BLOOD (ROUTINE X 2)
Culture: NO GROWTH
Culture: NO GROWTH
Special Requests: ADEQUATE

## 2023-07-29 LAB — TSH: TSH: 2.332 u[IU]/mL (ref 0.350–4.500)

## 2023-07-29 LAB — ECHOCARDIOGRAM COMPLETE
AR max vel: 2.3 cm2
AV Area VTI: 2.17 cm2
AV Area mean vel: 2.18 cm2
AV Mean grad: 2.8 mm[Hg]
AV Peak grad: 5.3 mm[Hg]
Ao pk vel: 1.15 m/s
Height: 66 in
S' Lateral: 2.8 cm
Weight: 3185.21 [oz_av]

## 2023-07-29 LAB — MAGNESIUM: Magnesium: 1.7 mg/dL (ref 1.7–2.4)

## 2023-07-29 MED ORDER — AMIODARONE HCL IN DEXTROSE 360-4.14 MG/200ML-% IV SOLN
30.0000 mg/h | INTRAVENOUS | Status: DC
  Filled 2023-07-29: qty 200

## 2023-07-29 MED ORDER — AMIODARONE LOAD VIA INFUSION
150.0000 mg | Freq: Once | INTRAVENOUS | Status: AC
  Administered 2023-07-29: 150 mg via INTRAVENOUS
  Filled 2023-07-29: qty 83.34

## 2023-07-29 MED ORDER — DILTIAZEM HCL 25 MG/5ML IV SOLN
10.0000 mg | Freq: Once | INTRAVENOUS | Status: AC
  Administered 2023-07-29: 10 mg via INTRAVENOUS

## 2023-07-29 MED ORDER — DILTIAZEM HCL-DEXTROSE 125-5 MG/125ML-% IV SOLN (PREMIX)
5.0000 mg/h | INTRAVENOUS | Status: DC
  Filled 2023-07-29: qty 125

## 2023-07-29 MED ORDER — LORAZEPAM 2 MG/ML IJ SOLN
0.5000 mg | Freq: Once | INTRAMUSCULAR | Status: AC
  Administered 2023-07-29: 0.5 mg via INTRAVENOUS
  Filled 2023-07-29: qty 1

## 2023-07-29 MED ORDER — DILTIAZEM HCL 25 MG/5ML IV SOLN
5.0000 mg | Freq: Once | INTRAVENOUS | Status: AC
  Administered 2023-07-29 (×2): 5 mg via INTRAVENOUS

## 2023-07-29 MED ORDER — METOPROLOL TARTRATE 5 MG/5ML IV SOLN
2.5000 mg | Freq: Four times a day (QID) | INTRAVENOUS | Status: DC
  Administered 2023-07-29 – 2023-08-04 (×21): 2.5 mg via INTRAVENOUS
  Filled 2023-07-29 (×23): qty 5

## 2023-07-29 MED ORDER — DILTIAZEM HCL 60 MG PO TABS
60.0000 mg | ORAL_TABLET | Freq: Three times a day (TID) | ORAL | Status: DC
  Administered 2023-07-29 – 2023-08-02 (×7): 60 mg via ORAL
  Filled 2023-07-29 (×11): qty 1

## 2023-07-29 MED ORDER — POTASSIUM CHLORIDE 20 MEQ PO PACK
40.0000 meq | PACK | Freq: Once | ORAL | Status: AC
  Administered 2023-07-29: 40 meq via ORAL
  Filled 2023-07-29: qty 2

## 2023-07-29 MED ORDER — AMIODARONE HCL IN DEXTROSE 360-4.14 MG/200ML-% IV SOLN
60.0000 mg/h | INTRAVENOUS | Status: DC
  Administered 2023-07-29 (×2): 60 mg/h via INTRAVENOUS
  Filled 2023-07-29 (×3): qty 200

## 2023-07-29 MED ORDER — MAGNESIUM SULFATE 4 GM/100ML IV SOLN
4.0000 g | Freq: Once | INTRAVENOUS | Status: AC
  Administered 2023-07-29: 4 g via INTRAVENOUS
  Filled 2023-07-29: qty 100

## 2023-07-29 MED ORDER — LORAZEPAM 0.5 MG PO TABS
0.5000 mg | ORAL_TABLET | ORAL | Status: DC | PRN
  Filled 2023-07-29: qty 1

## 2023-07-29 NOTE — Progress Notes (Signed)
*  PRELIMINARY RESULTS* Echocardiogram 2D Echocardiogram has been performed.  [name] 07/29/2023, 9:13 AM

## 2023-07-29 NOTE — Progress Notes (Signed)
1610 Messaged Dr. Everlean Alstrom about pt's increase heart rate 120 to 140 afib, Pt cleaned and dried and turned to position of comfort.see orders

## 2023-07-29 NOTE — Progress Notes (Signed)
 PROGRESS NOTE   [name]  FMW:[id number] DOB: 24-Jul-1940 DOA: 07/24/2023 PCP: Alphonsa Elsie RAMAN, MD (Inactive)   Chief Complaint  Patient presents with   Altered Mental Status   Level of care: Stepdown  Brief Admission History:  83 y.o. female, with a history of paroxysmal atrial fibrillation, diabetes mellitus type 2, hypertension, dysphagia, pulmonary embolus, hypothyroidism, and esophageal dysmotility, dementia, living at SNF for last 5 years, patient with advanced dementia at baseline, bedbound as discussed with family. -Patient was brought by ED secondary to altered mental status, increased lethargy, patient was noted to be altered since yesterday, apparently she is on antibiotics for UTI, as discussed with family, she is with advanced dementia, but usually communicative, remember some family members, bedbound at baseline.   -In ED she was altered, CT head with no acute findings, significant for chronic infarcts, CT abdomen pelvics with questionable right lower quadrant gas-fluid collection, likely within the bowels, but due to emotional artifact could not rule out abscess or perforated fluid collection, her respiratory panel significant for positive influenza A, [facility] consulted to admit   Assessment and Plan:  Acute metabolic encephalopathy -Most likely in setting of infectious process, including influenza -CT head with no acute findings -Per reports patient was treated for UTI at facility prior to arrival  -palliative medicine consultation due to failure to thrive, advancing dementia, poor oral intake   Influenza infection -continue Tamiflu  to complete course    A-fib with RVR - resolved  -On apixaban  for anticoagulation -currently off Cardizem  drip and back on oral cardizem   -recurrence of Afib RVR overnight 2/4, started on IV amio by night provider, cardiology consulted and now back on oral diltiazem  and off IV amiodarone   Questionable right lower quadrant  fluid collection, likely just just fluid-gas most likely within the bowel -CT abdomen showing fluid and gas in the right lower quadrant of the abdomen, possibly within bowel, been difficult to confirm due to motion artifact , mentation to exclude possibility of abscess or contained perforation -Consulted general surgery - see consult notes  -advanced to clears diet per surgeon -ok to discontinue Zosyn  per surgeon -ok to resume apixaban  per surgeon -surgery signing off 2/2. Ok for diet; advanced to dys 1 on 2/2    History of PE -Resumed apixaban     Dementia Deconditioning -Bedbound at baseline -Continue with supportive care -palliative consultation for goals of care given no meaningful improvement from maximal medical care   Hyperlipidemia -continue with statin   Hypothyroidism -Continue with Synthroid    Hypertension -Continue with Cardizem  CD   Right forearm fluid collection likely hematoma -Is not warm or tender, likely related to hematoma from recent fall, ED physician attempted to aspirate only small amount of blood came out   DVT prophylaxis: SCDs Code Status: DNR  Family Communication: t/c to brother 1/31 no answer  Disposition: TBD  pending palliative discussions    Consultants:  Surgery Palliative   Procedures:   Antimicrobials:    Subjective: Pt nonverbal, arousable, mouth breathing;   Objective: Vitals:   07/29/23 0831 07/29/23 0900 07/29/23 1000 07/29/23 1131  BP:  (!) 156/97 128/72   Pulse:  (!) 107 94   Resp:  (!) 26 (!) 22   Temp:    98.1 F (36.7 C)  TempSrc:      SpO2: 100% 98% 95%   Weight:      Height:        Intake/Output Summary (Last 24 hours) at 07/29/2023 1142 Last data filed  at 07/29/2023 1014 Gross per 24 hour  Intake 434.11 ml  Output 150 ml  Net 284.11 ml   Filed Weights   07/26/23 0400 07/27/23 0526 07/29/23 0612  Weight: 91 kg 91 kg 90.3 kg   Examination:  General exam: does not appear to be in distress; pt with advanced  dementia. Mouth breathing, Mostly nonverbal but will smile and nod at times.  Respiratory system: coarse upper airway noises heard.  Cardiovascular system: tachycardic rate, irregularly irregular, normal S1 & S2 heard. Gastrointestinal system: Abdomen is obese, soft and NO tenderness to palpation.  No organomegaly or masses felt. Normal bowel sounds heard. Central nervous system: Alert but disoriented. No focal neurological deficits. Extremities: large right forearm hematoma. Symmetric 5 x 5 power. Skin: No rashes, lesions or ulcers. Psychiatry: Judgement and insight appear diminished with severe dementia. Mood & affect flat.   Data Reviewed: I have personally reviewed following labs and imaging studies  CBC: Recent Labs  Lab 07/24/23 1323 07/25/23 0403 07/26/23 0753 07/27/23 0834  WBC 3.8* 4.6 4.4 3.2*  NEUTROABS 2.7  --   --   --   HGB 12.7 11.7* 9.1* 10.9*  HCT 42.5 40.7 30.9* 37.1  MCV 90.6 93.6 90.6 90.5  PLT 225 210 165 177    Basic Metabolic Panel: Recent Labs  Lab 07/24/23 1323 07/25/23 0403 07/26/23 0753 07/27/23 0834 07/29/23 0444  NA 141 142 138 141  --   K 3.7 4.3 3.5 3.6  --   CL 105 107 105 105  --   CO2 27 25 26 30   --   GLUCOSE 87 67* 80 82  --   BUN 11 11 9  6*  --   CREATININE 0.67 0.55 0.44 0.47  --   CALCIUM  8.6* 8.5* 8.2* 8.5*  --   MG  --   --   --   --  1.7    CBG: Recent Labs  Lab 07/25/23 0647 07/26/23 0737  GLUCAP 159* 81    Recent Results (from the past 240 hours)  Blood Culture (routine x 2)     Status: None   Collection Time: 07/24/23  1:23 PM   Specimen: BLOOD  Result Value Ref Range Status   Specimen Description BLOOD LEFT ANTECUBITAL  Final   Special Requests   Final    BOTTLES DRAWN AEROBIC ONLY Blood Culture adequate volume   Culture   Final    NO GROWTH 5 DAYS Performed at [facility], [address], [place], [place] [address]    Report Status 07/29/2023 FINAL  Final  Blood Culture (routine x 2)     Status: None    Collection Time: 07/24/23  1:29 PM   Specimen: BLOOD  Result Value Ref Range Status   Specimen Description BLOOD BLOOD LEFT FOREARM  Final   Special Requests   Final    BOTTLES DRAWN AEROBIC AND ANAEROBIC Blood Culture results may not be optimal due to an inadequate volume of blood received in culture bottles   Culture   Final    NO GROWTH 5 DAYS Performed at [facility], [address] [place], [place] [address]    Report Status 07/29/2023 FINAL  Final  Resp panel by RT-PCR (RSV, Flu A&B, Covid) Anterior Nasal Swab     Status: Abnormal   Collection Time: 07/24/23  2:42 PM   Specimen: Anterior Nasal Swab  Result Value Ref Range Status   SARS Coronavirus 2 by RT PCR NEGATIVE NEGATIVE Final    Comment: (  NOTE) SARS-CoV-2 target nucleic acids are NOT DETECTED.  The SARS-CoV-2 RNA is generally detectable in upper respiratory specimens during the acute phase of infection. The lowest concentration of SARS-CoV-2 viral copies this assay can detect is 138 copies/mL. A negative result does not preclude SARS-Cov-2 infection and should not be used as the sole basis for treatment or other patient management decisions. A negative result may occur with  improper specimen collection/handling, submission of specimen other than nasopharyngeal swab, presence of viral mutation(s) within the areas targeted by this assay, and inadequate number of viral copies(<138 copies/mL). A negative result must be combined with clinical observations, patient history, and epidemiological information. The expected result is Negative.  Fact Sheet for Patients:  bloggercourse.com  Fact Sheet for Healthcare Providers:  seriousbroker.it  This test is no t yet approved or cleared by the [place]  FDA and  has been authorized for detection and/or diagnosis of SARS-CoV-2 by FDA under an Emergency Use Authorization (EUA). This EUA will remain  in effect (meaning this  test can be used) for the duration of the COVID-19 declaration under Section 564(b)(1) of the Act, 21 U.S.C.section 360bbb-3(b)(1), unless the authorization is terminated  or revoked sooner.       Influenza A by PCR POSITIVE (A) NEGATIVE Final   Influenza B by PCR NEGATIVE NEGATIVE Final    Comment: (NOTE) The Xpert Xpress SARS-CoV-2/FLU/RSV plus assay is intended as an aid in the diagnosis of influenza from Nasopharyngeal swab specimens and should not be used as a sole basis for treatment. Nasal washings and aspirates are unacceptable for Xpert Xpress SARS-CoV-2/FLU/RSV testing.  Fact Sheet for Patients: bloggercourse.com  Fact Sheet for Healthcare Providers: seriousbroker.it  This test is not yet approved or cleared by the [place]  FDA and has been authorized for detection and/or diagnosis of SARS-CoV-2 by FDA under an Emergency Use Authorization (EUA). This EUA will remain in effect (meaning this test can be used) for the duration of the COVID-19 declaration under Section 564(b)(1) of the Act, 21 U.S.C. section 360bbb-3(b)(1), unless the authorization is terminated or revoked.     Resp Syncytial Virus by PCR NEGATIVE NEGATIVE Final    Comment: (NOTE) Fact Sheet for Patients: bloggercourse.com  Fact Sheet for Healthcare Providers: seriousbroker.it  This test is not yet approved or cleared by the [place]  FDA and has been authorized for detection and/or diagnosis of SARS-CoV-2 by FDA under an Emergency Use Authorization (EUA). This EUA will remain in effect (meaning this test can be used) for the duration of the COVID-19 declaration under Section 564(b)(1) of the Act, 21 U.S.C. section 360bbb-3(b)(1), unless the authorization is terminated or revoked.  Performed at [facility], [address] [place], [place] [address]   MRSA Next Gen by PCR, Nasal     Status:  Abnormal   Collection Time: 07/25/23  1:20 PM   Specimen: Nasal Mucosa; Nasal Swab  Result Value Ref Range Status   MRSA by PCR Next Gen DETECTED (A) NOT DETECTED Final    Comment: RESULT CALLED TO, READ BACK BY AND VERIFIED WITH: BREANNA,RN ON 07/25/23 AT 1913 BY PURDIE,J        The GeneXpert MRSA Assay (FDA approved for NASAL specimens only), is one component of a comprehensive MRSA colonization surveillance program. It is not intended to diagnose MRSA infection nor to guide or monitor treatment for MRSA infections. Performed at [facility], [address], [place], [place] [address]      Radiology Studies: No results found.  Scheduled Meds:  apixaban   5 mg Oral BID   bisacodyl   10 mg Rectal Daily   budesonide  (PULMICORT ) nebulizer solution  0.25 mg Nebulization BID   buPROPion   150 mg Oral Daily   Chlorhexidine  Gluconate Cloth  6 each Topical Daily   diltiazem   60 mg Oral TID   escitalopram   20 mg Oral Daily   hydrOXYzine   25 mg Oral BID   ipratropium  0.5 mg Nebulization TID   levalbuterol   0.63 mg Nebulization TID   levothyroxine   50 mcg Oral QAC breakfast   magnesium  oxide  400 mg Oral Daily   montelukast   10 mg Oral Daily   mupirocin  ointment  1 Application Nasal BID   oseltamivir   75 mg Oral BID   pantoprazole   40 mg Oral Daily   rosuvastatin   20 mg Oral Daily   sodium chloride  flush  10-40 mL Intracatheter Q12H   sodium chloride  flush  10-40 mL Intracatheter Q12H   Continuous Infusions:   LOS: 5 days   Critical Care Procedure Note Authorized and Performed by: KYM Louder MD  Total Critical Care time:  57 mins Due to a high probability of clinically significant, life threatening deterioration, the patient required my highest level of preparedness to intervene emergently and I personally spent this critical care time directly and personally managing the patient.  This critical care time included obtaining a history; examining the patient, pulse oximetry;  ordering and review of studies; arranging urgent treatment with development of a management plan; evaluation of patient's response of treatment; frequent reassessment; and discussions with other providers.  This critical care time was performed to assess and manage the high probability of imminent and life threatening deterioration that could result in multi-organ failure.  It was exclusive of separately billable procedures and treating other patients and teaching time.    Afton Louder, MD How to contact the [facility] Attending or Consulting provider 7A - 7P or covering provider during after hours 7P -7A, for this patient?  Check the care team in [facility] and look for a) attending/consulting [facility] provider listed and b) the [facility] team listed Log into [email or web address] to find provider on call.  Locate the [facility] provider you are looking for under [facility] and page to a number that you can be directly reached. If you still have difficulty reaching the provider, please page the [facility] (Director on Call) for the Hospitalists listed on amion for assistance.  07/29/2023, 11:42 AM

## 2023-07-29 NOTE — Progress Notes (Signed)
Patient with decreased mentation. Will only open eyes and respond to painful stimuli. Difficulty with giving PO medications safely.

## 2023-07-29 NOTE — Progress Notes (Signed)
0600 transferred to ICU #3 after report to [facility]. As ordered

## 2023-07-29 NOTE — Progress Notes (Signed)
Dr. Birdie Riddle notified re: pt continued increase hr 130 to 150 Afib Cardiazem and ativan IV given as ordered with no changes.

## 2023-07-29 NOTE — Progress Notes (Signed)
 Palliative: [name] has worsened overnight.  She is seen today in the intensive care.  She is lying quietly in bed, appearing acutely/chronically ill and very frail, obese.  She does not interact with me in any meaningful way.  She clearly cannot make her basic needs known.  There is no family at bedside at this time.  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  Face-to-face conference with bedside nursing staff related to patient condition, needs.    Call to brother, [name].  We talk about [name]'s acute declines overnight, her lethargy, obesity hypoventilation, possible aspiration.  I shared my worry.  I ask if he is surprised by her declines.  He shares that she has had, a lot of problems for really long while.  He does, however, tell me that he is surprised how fast she went down.   We talk about 5 days in the hospital without meaningful improvements.  We reviewed, in detail, the treatment plan, most recent labs. I share that anything can happen suddenly, and we would not put [name] on life support, to which he agrees.  He shares that he will come to the hospital tomorrow to see his sister.  He is to ask bedside nursing staff to reach out to the palliative team.  Conference with attending, bedside nursing staff, transition of care team related to patient condition, needs, goals of care, disposition.  Plan: Continue to treat the treatable but no CPR or intubation.  24 hours for outcomes.  PMT to follow-up.            50 minutes  Dyke Weible, NP Palliative medicine team Team phone [phone]

## 2023-07-29 NOTE — Progress Notes (Signed)
0300 cardiazem 5mg  IV x2 as ordered given. With some relief heartrate ranging 110 to 140. Will continue to monitor.

## 2023-07-29 NOTE — Progress Notes (Addendum)
 Critical care note:  Date of note 2//2025.  Subjective: [name] went into atrial fibrillation with RVR with a rate in 120s to 140s.  This occurred after having a bath.  She was initially incontinent and irritated.  BP was 153/91.  She was given 5 mg of IV Cardizem  twice without help.  She was also given 40 mg p.o. potassium chloride .  Magnesium  level was ordered.  Her heart rate continued to range from 110s to 140 in atrial fibrillation and BP was 143/97.  She was awake and agitated.  She was given 0.5 mg of IV Ativan  and 10 mg of IV Cardizem  bolus.  Despite that heart rate remained 1 30-1 50 with a BP of 140/103.  The patient was fairly anxious.  No chest pain or palpitations.  No nausea or vomiting.  No worsening dyspnea or cough or wheezing.  No fever or chills.  No dysuria, oliguria or hematuria or flank pain.  Objective: Physical semination: Generally: Acute ill elderly Caucasian female in no acute distress Vital signs per history of present illness. Head - atraumatic, normocephalic.  Pupils - equal, round and reactive to light and accommodation. Extraocular movements are intact. No scleral icterus.  Oropharynx - moist mucous membranes and tongue. No pharyngeal erythema or exudate.  Neck - supple. No JVD. Carotid pulses 2+ bilaterally. No carotid bruits. No palpable thyromegaly or lymphadenopathy. Cardiovascular - Irregularly irregular tachycardic rhythm. Normal S1 and S2. No murmurs, gallops or rubs.  Lungs - clear to auscultation bilaterally.  Abdomen - soft and nontender. Positive bowel sounds. No palpable organomegaly or masses.  Extremities - no pitting edema, clubbing or cyanosis.  Neuro - grossly non-focal. Skin - no rashes. Breast, pelvic and rectal - deferred.  Assessment/plan: 1.  Persistent atrial fibrillation with rapid ventricular response. - The patient was transferred to a stepdown unit bed. - She was ordered IV amiodarone  bolus and drip. - We will utilize IV Cardizem   drip if needed. - We will continue Eliquis . - We will follow serial troponins. - Will optimize electrolytes. - She will need cardiology consult this morning. - Her most recent 2D echo on 12/24/2017 revealed an EF of 60 to 65% with grade 2 diastolic dysfunction. - Will obtain a 2D echo. -Will closely monitor her.  2.  Acute metabolic cephalopathy with influenza and history of PE, dyslipidemia and hypothyroidism. - We will continue current plan of care. - Will check TSH.  Authorized and performed by: Madison Peaches, MD Total critical care time:  35      minutes. Due to a high probability of clinically significant, life-threatening deterioration, the patient required my highest level of preparedness to intervene emergently and I personally spent this critical care time directly and personally managing the patient.  This critical care time included obtaining a history, examining the patient, pulse oximetry, ordering and review of studies, arranging urgent treatment with development of management plan, evaluation of patient's response to treatment, frequent reassessment, and discussions with other providers. This critical care time was performed to assess and manage the high probability of imminent, life-threatening deterioration that could result in multiorgan failure.  It was exclusive of separately billable procedures and treating other patients and teaching time.

## 2023-07-29 NOTE — Consult Note (Signed)
 Cardiology Consultation   Patient ID: [name] MRN: [id number]; DOB: Oct 21, 1940  Admit date: 07/24/2023 Date of Consult: 07/29/2023  PCP:  [name] Elsie RAMAN, MD (Inactive)   [facility] Providers Cardiologist:  [name] Sierras, MD   {     Patient Profile:   [name] is a 83 y.o. female with a hx of PAF on xarelto , history of PE, CAD with prior BMS and DES to LAD in 2004 per notes, DES to D2 in 2005, HLD, OSA, DM2, chronic HFpEF, advanced dementia/bedbound who is being seen 07/29/2023 for the evaluation of tachycardia at the request of Dr Vicci.  History of Present Illness:   [name] 83 yo female history of PAF on xarelto , history of PE, CAD with prior BMS and DES to LAD in 2004 per notes, DES to D2 in 2005, HLD, OSA, DM2, chronic HFpEF, advanced dementia/bedbound, presents with AMS. Flu + in ER. In this setting found to be in afib with RVR, started on diltiazem  drip. Cardiology consulted to help manage.   K 3.7 BUN 11 Cr 0.67 BUN 11 WBC 3.8 Hgb 12.7 Plt 225 TSH 2.3 Lactic acid 1.3 EKG afib 130s CXR: small left pleural effusion CT head: no acute process CT C/A/P: Fluid and gas in the right lower quadrant of the abdomen which is most likely within the bowel  12/2017 echo: LVE 60-65%, no WMAs, grade II dd     Past Medical History:  Diagnosis Date   Allergy    Arthritis    Asthma    CAD (coronary artery disease)    BMS and DES to LAD 2004; DES to D2 2005   Chronic pain of left knee    Depression    Diverticulitis of intestine    Essential hypertension    FH: colonic polyps    Adenomataous   GERD (gastroesophageal reflux disease)    History of kidney stones    Hyperlipidemia    Hypothyroidism    Insomnia    Irritable bowel syndrome    NSTEMI (non-ST elevated myocardial infarction) (HCC)    2005   Paroxysmal atrial fibrillation (HCC)    Pulmonary embolism (HCC)    2000 and 2010   Sleep apnea    Type 2 diabetes mellitus (HCC)    Venous  stasis     Past Surgical History:  Procedure Laterality Date   ABDOMINAL HYSTERECTOMY     APPENDECTOMY     BALLOON DILATION N/A 11/11/2013   Procedure: BALLOON DILATION;  Surgeon: [name] RAYMOND Rivet, MD;  [facility];  Service: Endoscopy;  Laterality: N/A;   BREAST REDUCTION SURGERY     BREAST SURGERY     CHOLECYSTECTOMY     CLEFT PALATE REPAIR     4 surgeries   COLONOSCOPY  2011   negative   COLONOSCOPY N/A 05/10/2014   Procedure: COLONOSCOPY;  Surgeon: [name] RAYMOND Rivet, MD;  [facility]: [facility];  Service: Endoscopy;  Laterality: N/A;   ESOPHAGEAL DILATION N/A 03/08/2016   Procedure: ESOPHAGEAL DILATION;  Surgeon: [name] RAYMOND Rivet, MD;  [facility];  Service: Endoscopy;  Laterality: N/A;   ESOPHAGEAL DILATION N/A 05/16/2019   Procedure: ESOPHAGEAL DILATION;  Surgeon: [name] Margo CROME, MD;  [facility];  Service: Endoscopy;  Laterality: N/A;   ESOPHAGOGASTRODUODENOSCOPY N/A 11/11/2013   Procedure: ESOPHAGOGASTRODUODENOSCOPY (EGD);  Surgeon: [name] RAYMOND Rivet, MD;  [facility];  Service: Endoscopy;  Laterality: N/A;  200-moved to 100 Ann notified pt  ESOPHAGOGASTRODUODENOSCOPY N/A 05/09/2014   Procedure: ESOPHAGOGASTRODUODENOSCOPY (EGD);  Surgeon: [name] RAYMOND Rivet, MD;  [facility];  Service: Endoscopy;  Laterality: N/A;   ESOPHAGOGASTRODUODENOSCOPY N/A 03/08/2016   Procedure: ESOPHAGOGASTRODUODENOSCOPY (EGD);  Surgeon: [name] RAYMOND Rivet, MD;  [facility];  Service: Endoscopy;  Laterality: N/A;  2:20   ESOPHAGOGASTRODUODENOSCOPY N/A 05/16/2019   Procedure: ESOPHAGOGASTRODUODENOSCOPY (EGD);  Surgeon: [name] Margo CROME, MD;  [facility];  Service: Endoscopy;  Laterality: N/A;   HEEL SPUR SURGERY     KNEE SURGERY     MALONEY DILATION N/A 11/11/2013   Procedure: MALONEY DILATION;  Surgeon: [name] RAYMOND Rivet, MD;  [facility];  Service: Endoscopy;  Laterality: N/A;   OOPHORECTOMY     SAVORY DILATION N/A  11/11/2013   Procedure: SAVORY DILATION;  Surgeon: [name] RAYMOND Rivet, MD;  [facility];  Service: Endoscopy;  Laterality: N/A;   TONSILLECTOMY       Inpatient Medications: Scheduled Meds:  apixaban   5 mg Oral BID   bisacodyl   10 mg Rectal Daily   budesonide  (PULMICORT ) nebulizer solution  0.25 mg Nebulization BID   buPROPion   150 mg Oral Daily   Chlorhexidine  Gluconate Cloth  6 each Topical Daily   escitalopram   20 mg Oral Daily   hydrOXYzine   25 mg Oral BID   ipratropium  0.5 mg Nebulization TID   levalbuterol   0.63 mg Nebulization TID   levothyroxine   50 mcg Oral QAC breakfast   magnesium  oxide  400 mg Oral Daily   montelukast   10 mg Oral Daily   mupirocin  ointment  1 Application Nasal BID   oseltamivir   75 mg Oral BID   pantoprazole   40 mg Oral Daily   rosuvastatin   20 mg Oral Daily   sodium chloride  flush  10-40 mL Intracatheter Q12H   sodium chloride  flush  10-40 mL Intracatheter Q12H   Continuous Infusions:  amiodarone  60 mg/hr (07/29/23 0929)   Followed by   amiodarone      PRN Meds: acetaminophen  **OR** acetaminophen , hydrALAZINE , levalbuterol , magnesium  citrate, meclizine , polyethylene glycol, polyvinyl alcohol , sodium chloride  flush, sodium chloride  flush  Allergies:    Allergies  Allergen Reactions   Cefprozil  Nausea And Vomiting    Unknown   Demerol  Nausea And Vomiting   Meperidine  Hcl Other (See Comments)    Unknown    Social History:   Social History   Socioeconomic History   Marital status: Widowed    Spouse name: Not on file   Number of children: Not on file   Years of education: Not on file   Highest education level: Not on file  Occupational History   Occupation: retired    Associate Professor: RETIRED  Tobacco Use   Smoking status: Former    Current packs/day: 2.00    Average packs/day: 2.0 packs/day for 36.2 years (72.5 ttl pk-yrs)    Types: Cigarettes    Start date: 05/05/1987   Smokeless tobacco: Never  Vaping Use   Vaping status:  Never Used  Substance and Sexual Activity   Alcohol  use: No    Alcohol /week: 0.0 standard drinks of alcohol    Drug use: No   Sexual activity: Not on file  Other Topics Concern   Not on file  Social History Narrative   Not on file   Social Drivers of Health   Financial Resource Strain: Not on file  Food Insecurity: No Food Insecurity (07/25/2023)   Hunger Vital Sign    Worried About Running Out of Food  in the Last Year: Never true    Ran Out of Food in the Last Year: Never true  Transportation Needs: Patient Unable To Answer (07/25/2023)   PRAPARE - Transportation    Lack of Transportation (Medical): Patient unable to answer    Lack of Transportation (Non-Medical): Patient unable to answer  Physical Activity: Not on file  Stress: Not on file  Social Connections: Patient Unable To Answer (07/25/2023)   Social Connection and Isolation Panel [NHANES]    Frequency of Communication with Friends and Family: Patient unable to answer    Frequency of Social Gatherings with Friends and Family: Patient unable to answer    Attends Religious Services: Patient unable to answer    Active Member of Clubs or Organizations: Patient unable to answer    Attends Banker Meetings: Patient unable to answer    Marital Status: Patient unable to answer  Intimate Partner Violence: Patient Unable To Answer (07/25/2023)   Humiliation, Afraid, Rape, and Kick questionnaire    Fear of Current or Ex-Partner: Patient unable to answer    Emotionally Abused: Patient unable to answer    Physically Abused: Patient unable to answer    Sexually Abused: Patient unable to answer    Family History:    Family History  Problem Relation Age of Onset   Heart attack Mother    Diabetes Mother    Hyperlipidemia Mother    Heart attack Father    Lung cancer Sister    Lymphoma Brother    Colon cancer Neg Hx      ROS:  Please see the history of present illness.   All other ROS reviewed and negative.      Physical Exam/Data:   Vitals:   07/29/23 0612 07/29/23 0731 07/29/23 0831 07/29/23 0900  BP:  (!) 186/65  (!) 156/97  Pulse:  (!) 140  (!) 107  Resp:  (!) 24  (!) 26  Temp: 98.1 F (36.7 C) 97.6 F (36.4 C)    TempSrc: Axillary Oral    SpO2:  92% 100% 98%  Weight: 90.3 kg     Height:        Intake/Output Summary (Last 24 hours) at 07/29/2023 0948 Last data filed at 07/29/2023 9070 Gross per 24 hour  Intake 404.77 ml  Output 150 ml  Net 254.77 ml      07/29/2023    6:12 AM 07/27/2023    5:26 AM 07/26/2023    4:00 AM  Last 3 Weights  Weight (lbs) 199 lb 1.2 oz 200 lb 9.9 oz 200 lb 9.9 oz  Weight (kg) 90.3 kg 91 kg 91 kg     Body mass index is 32.13 kg/m.  General:  Well nourished, well developed, in no acute distress HEENT: normal Neck: no JVD Vascular: No carotid bruits; Distal pulses 2+ bilaterally Cardiac:  irreg Lungs:  bilateral wheezing Abd: soft, nontender, no hepatomegaly  Ext: no edema Musculoskeletal:  No deformities, BUE and BLE strength normal and equal Skin: warm and dry    Laboratory Data:  High Sensitivity Troponin:  No results for input(s): TROPONINIHS in the last 720 hours.   Chemistry Recent Labs  Lab 07/25/23 0403 07/26/23 0753 07/27/23 0834 07/29/23 0444  NA 142 138 141  --   K 4.3 3.5 3.6  --   CL 107 105 105  --   CO2 25 26 30   --   GLUCOSE 67* 80 82  --   BUN 11 9 6*  --  CREATININE 0.55 0.44 0.47  --   CALCIUM  8.5* 8.2* 8.5*  --   MG  --   --   --  1.7  GFRNONAA >60 >60 >60  --   ANIONGAP 10 7 6   --     Recent Labs  Lab 07/24/23 1323  PROT 6.8  ALBUMIN  2.8*  AST 23  ALT 10  ALKPHOS 84  BILITOT 0.6   Lipids No results for input(s): CHOL, TRIG, HDL, LABVLDL, LDLCALC, CHOLHDL in the last 168 hours.  Hematology Recent Labs  Lab 07/25/23 0403 07/26/23 0753 07/27/23 0834  WBC 4.6 4.4 3.2*  RBC 4.35 3.41* 4.10  HGB 11.7* 9.1* 10.9*  HCT 40.7 30.9* 37.1  MCV 93.6 90.6 90.5  MCH 26.9 26.7 26.6  MCHC 28.7*  29.4* 29.4*  RDW 17.1* 16.4* 16.0*  PLT 210 165 177   Thyroid   Recent Labs  Lab 07/29/23 0444  TSH 2.332    BNPNo results for input(s): BNP, PROBNP in the last 168 hours.  DDimer No results for input(s): DDIMER in the last 168 hours.   Radiology/Studies:  DG Chest Portable 1 View Result Date: 07/25/2023 CLINICAL DATA:  PICC line placement EXAM: PORTABLE CHEST 1 VIEW COMPARISON:  Chest radiograph and CT chest, abdomen, and pelvis dated 07/24/2023. FINDINGS: Right PICC line tip overlies the lower SVC. Low lung volumes. Stable cardiomegaly. Aortic atherosclerosis. Similar indistinctness of the left costophrenic angle, likely correlates with prominent subpleural fat noted on the prior CT. No focal consolidation, sizeable pleural effusion, or pneumothorax. The visualized osseous structures are unchanged. IMPRESSION: 1. Right PICC line tip overlies the lower SVC. 2. Low lung volumes.  No acute cardiopulmonary findings. Electronically Signed   By: Harrietta Sherry M.D.   On: 07/25/2023 13:11     Assessment and Plan:   1.Afib - history of what looks to be  longstanding persistent afib, rate controlled at home on oral dilt 120mg  - presented with afib with RVR in setting of being flu +. Initially on dilt gtt then transitoined to oral when rate controlled - recurrent afib with RVR while on the floor, transferred to stepdown.  - from notes given IV boluses of diltiazem  and rates did not improve, then started on IV amiodarone .  - looks to be longstanding persistent afib from EKG review, her bp's have been fine. Do not see a strong indication for amio at this time. Start ditlaizem oral 60mg  tid, if rates bump back up start dilt gtt.  - Drive for tachycardia should lessen as systemic illness resolves - continue eliquis  for stroke prevention   2. Influenza - per primary team  3. Advanced dementia/AMS - per primary team    For questions or updates, please contact [facility] Please consult [email or web address] for contact info under    Signed, Alvan Carrier, MD  07/29/2023 9:48 AM

## 2023-07-29 NOTE — Progress Notes (Signed)
Dr Arville Care updated that Po meds are not given as patient is too lethargic, she will open her eyes when awaken but will close it again. She also has been coughing nonstop and will not let this nurse to suction her mouth. Patient is at risk for aspiration.

## 2023-07-30 ENCOUNTER — Inpatient Hospital Stay (HOSPITAL_COMMUNITY): Payer: Medicare (Managed Care)

## 2023-07-30 DIAGNOSIS — I4891 Unspecified atrial fibrillation: Secondary | ICD-10-CM | POA: Diagnosis not present

## 2023-07-30 DIAGNOSIS — Z7189 Other specified counseling: Secondary | ICD-10-CM | POA: Diagnosis not present

## 2023-07-30 DIAGNOSIS — I4819 Other persistent atrial fibrillation: Secondary | ICD-10-CM

## 2023-07-30 DIAGNOSIS — G9340 Encephalopathy, unspecified: Secondary | ICD-10-CM | POA: Diagnosis not present

## 2023-07-30 DIAGNOSIS — Z515 Encounter for palliative care: Secondary | ICD-10-CM | POA: Diagnosis not present

## 2023-07-30 DIAGNOSIS — J101 Influenza due to other identified influenza virus with other respiratory manifestations: Secondary | ICD-10-CM | POA: Diagnosis not present

## 2023-07-30 LAB — COMPREHENSIVE METABOLIC PANEL
ALT: 10 U/L (ref 0–44)
AST: 17 U/L (ref 15–41)
Albumin: 2.7 g/dL — ABNORMAL LOW (ref 3.5–5.0)
Alkaline Phosphatase: 67 U/L (ref 38–126)
Anion gap: 12 (ref 5–15)
BUN: 5 mg/dL — ABNORMAL LOW (ref 8–23)
CO2: 29 mmol/L (ref 22–32)
Calcium: 8.3 mg/dL — ABNORMAL LOW (ref 8.9–10.3)
Chloride: 102 mmol/L (ref 98–111)
Creatinine, Ser: 0.47 mg/dL (ref 0.44–1.00)
GFR, Estimated: >60 mL/min (ref >60)
Glucose, Bld: 80 mg/dL (ref 70–99)
Potassium: 3.3 mmol/L — ABNORMAL LOW (ref 3.5–5.1)
Sodium: 143 mmol/L (ref 135–145)
Total Bilirubin: 0.9 mg/dL (ref 0.0–1.2)
Total Protein: 6 g/dL — ABNORMAL LOW (ref 6.5–8.1)

## 2023-07-30 LAB — AMMONIA: Ammonia: 14 umol/L (ref 9–35)

## 2023-07-30 LAB — CBC
HCT: 39.3 % (ref 36.0–46.0)
Hemoglobin: 11.7 g/dL — ABNORMAL LOW (ref 12.0–15.0)
MCH: 26.3 pg (ref 26.0–34.0)
MCHC: 29.8 g/dL — ABNORMAL LOW (ref 30.0–36.0)
MCV: 88.3 fL (ref 80.0–100.0)
Platelets: 216 10*3/uL (ref 150–400)
RBC: 4.45 MIL/uL (ref 3.87–5.11)
RDW: 16 % — ABNORMAL HIGH (ref 11.5–15.5)
WBC: 10.4 10*3/uL (ref 4.0–10.5)
nRBC: 0 % (ref 0.0–0.2)

## 2023-07-30 LAB — FOLATE: Folate: 9.1 ng/mL (ref >5.9)

## 2023-07-30 LAB — VITAMIN B12: Vitamin B-12: 1363 pg/mL — ABNORMAL HIGH (ref 180–914)

## 2023-07-30 LAB — MAGNESIUM: Magnesium: 2 mg/dL (ref 1.7–2.4)

## 2023-07-30 LAB — PROCALCITONIN: Procalcitonin: <0.1 ng/mL

## 2023-07-30 MED ORDER — VANCOMYCIN HCL 1750 MG/350ML IV SOLN
1750.0000 mg | Freq: Once | INTRAVENOUS | Status: DC
  Filled 2023-07-30: qty 350

## 2023-07-30 MED ORDER — VANCOMYCIN HCL IN DEXTROSE 1-5 GM/200ML-% IV SOLN
1000.0000 mg | INTRAVENOUS | Status: DC
  Administered 2023-07-31 – 2023-08-02 (×3): 1000 mg via INTRAVENOUS
  Filled 2023-07-30 (×3): qty 200

## 2023-07-30 MED ORDER — VANCOMYCIN HCL 1750 MG/350ML IV SOLN
1750.0000 mg | Freq: Once | INTRAVENOUS | Status: AC
  Administered 2023-07-31: 1750 mg via INTRAVENOUS
  Filled 2023-07-30 (×2): qty 350

## 2023-07-30 MED ORDER — PIPERACILLIN-TAZOBACTAM 3.375 G IVPB
3.3750 g | Freq: Three times a day (TID) | INTRAVENOUS | Status: DC
Start: 2023-07-30 — End: 2023-08-05
  Administered 2023-07-30 – 2023-08-05 (×17): 3.375 g via INTRAVENOUS
  Filled 2023-07-30 (×17): qty 50

## 2023-07-30 NOTE — Progress Notes (Signed)
 SLP Cancellation Note  Patient Details Name: [name] MRN: [id number] DOB: [birth year]   Cancelled treatment:       Reason Eval/Treat Not Completed: Fatigue/lethargy limiting ability to participate. Pt is not alert/appropriate for PO trials at this time, ST will continue efforts and follow for goals of care. Thank you,  Cordarryl Monrreal H. [name], CCC-SLP Speech Language Pathologist    [name] 07/30/2023, 4:23 PM

## 2023-07-30 NOTE — Progress Notes (Addendum)
 PROGRESS NOTE  [name] FMW:[id number] DOB: 1941-06-22 DOA: 07/24/2023 PCP: Alphonsa Elsie RAMAN, MD (Inactive)  Brief History:  83 y.o. female, with a history of paroxysmal atrial fibrillation, diabetes mellitus type 2, hypertension, dysphagia, pulmonary embolus, hypothyroidism, and esophageal dysmotility, dementia, living at SNF for last 5 years, patient with advanced dementia at baseline, bedbound as discussed with family. -Patient was brought by ED secondary to altered mental status, increased lethargy, patient was noted to be altered since yesterday, apparently she is on antibiotics for UTI, as discussed with family, she is with advanced dementia, but usually communicative, remember some family members, bedbound at baseline.   -In ED she was altered, CT head with no acute findings, significant for chronic infarcts, CT abdomen pelvics with questionable right lower quadrant gas-fluid collection, likely within the bowels, but due to emotional artifact could not rule out abscess or perforated fluid collection, her respiratory panel significant for positive influenza A, [facility] hospitalist consulted to admit   Assessment/Plan: Acute metabolic encephalopathy -Most likely in setting of infectious process, including influenza -remains confused and agitated -CT head with no acute findings -Per reports patient was treated for UTI at facility prior to arrival  -palliative medicine consultation due to failure to thrive in setting of dementia   Influenza infection -continue Tamiflu --completed course -1/30 CT chest--Mild bilateral lower lobe cylindrical bronchiectasis and associated mild parenchymal densities. -check PCT   A-fib with RVR - resolved  -On apixaban  for anticoagulation -currently off Cardizem  drip and back on oral cardizem   -recurrence of Afib RVR overnight 2/4, started on IV amio by night provider, cardiology consulted and now back on oral diltiazem  and off IV amiodarone     Questionable right lower quadrant fluid collection, likely just just fluid-gas most likely within the bowel -CT abdomen showing fluid and gas in the right lower quadrant of the abdomen, possibly within bowel, been difficult to confirm due to motion artifact , mentation to exclude possibility of abscess or contained perforation -Consulted general surgery - see consult notes  -advanced to clears diet per surgeon -ok to discontinue Zosyn  per surgeon -ok to resume apixaban  per surgeon -surgery signing off 2/2. Ok for diet; advanced to dys 1 on 2/2    History of PE -Resumed apixaban     Major neurocognitive disorder Deconditioning -Bedbound at baseline -Continue with supportive care -palliative consultation for goals of care given no meaningful improvement from maximal medical care   Hyperlipidemia -continue with statin   Hypothyroidism -Continue with Synthroid    Hypertension -Continue with Cardizem  CD   Right forearm fluid collection likely hematoma -Is not warm or tender, likely related to hematoma from recent fall, ED physician attempted to aspirate only small amount of blood came out   DVT prophylaxis: apixaban  Code Status: DNR  Family Communication: none present Disposition: TBD  pending palliative discussions            Subjective: Pt is awake.  ROS not possible due to confusion and somnolence  Objective: Vitals:   07/29/23 2048 07/29/23 2300 07/30/23 0621 07/30/23 0759  BP:   128/78   Pulse:   96   Resp:  16 20   Temp:   (!) 97.5 F (36.4 C)   TempSrc:   Axillary   SpO2: 94%  94% 95%  Weight:      Height:        Intake/Output Summary (Last 24 hours) at 07/30/2023 0951 Last data filed at 07/30/2023 0526 Gross per  24 hour  Intake 29.34 ml  Output 150 ml  Net -120.66 ml   Weight change:  Exam:  General:  Pt is somonlent but awakens to voice, does not follow commands appropriately, not in acute distress HEENT: No icterus, No thrush, No neck mass,  [place]/AT Cardiovascular: IRRR, S1/S2, no rubs, no gallops Respiratory: upper airway wheeze.  Bibasilar rales. Abdomen: Soft/+BS, non tender, non distended, no guarding Extremities: No edema, No lymphangitis, No petechiae, No rashes, no synovitis   Data Reviewed: I have personally reviewed following labs and imaging studies Basic Metabolic Panel: Recent Labs  Lab 07/24/23 1323 07/25/23 0403 07/26/23 0753 07/27/23 0834 07/29/23 0444  NA 141 142 138 141  --   K 3.7 4.3 3.5 3.6  --   CL 105 107 105 105  --   CO2 27 25 26 30   --   GLUCOSE 87 67* 80 82  --   BUN 11 11 9  6*  --   CREATININE 0.67 0.55 0.44 0.47  --   CALCIUM  8.6* 8.5* 8.2* 8.5*  --   MG  --   --   --   --  1.7   Liver Function Tests: Recent Labs  Lab 07/24/23 1323  AST 23  ALT 10  ALKPHOS 84  BILITOT 0.6  PROT 6.8  ALBUMIN  2.8*   No results for input(s): LIPASE, AMYLASE in the last 168 hours. No results for input(s): AMMONIA in the last 168 hours. Coagulation Profile: Recent Labs  Lab 07/24/23 1323  INR 1.3*   CBC: Recent Labs  Lab 07/24/23 1323 07/25/23 0403 07/26/23 0753 07/27/23 0834  WBC 3.8* 4.6 4.4 3.2*  NEUTROABS 2.7  --   --   --   HGB 12.7 11.7* 9.1* 10.9*  HCT 42.5 40.7 30.9* 37.1  MCV 90.6 93.6 90.6 90.5  PLT 225 210 165 177   Cardiac Enzymes: No results for input(s): CKTOTAL, CKMB, CKMBINDEX, TROPONINI in the last 168 hours. BNP: Invalid input(s): POCBNP CBG: Recent Labs  Lab 07/25/23 0647 07/26/23 0737  GLUCAP 159* 81   HbA1C: No results for input(s): HGBA1C in the last 72 hours. Urine analysis:    Component Value Date/Time   COLORURINE YELLOW 07/05/2023 1934   APPEARANCEUR CLOUDY (A) 07/05/2023 1934   LABSPEC 1.018 07/05/2023 1934   PHURINE 6.0 07/05/2023 1934   GLUCOSEU NEGATIVE 07/05/2023 1934   HGBUR NEGATIVE 07/05/2023 1934   BILIRUBINUR NEGATIVE 07/05/2023 1934   KETONESUR 5 (A) 07/05/2023 1934   PROTEINUR NEGATIVE 07/05/2023 1934    UROBILINOGEN 0.2 05/08/2014 2240   NITRITE NEGATIVE 07/05/2023 1934   LEUKOCYTESUR LARGE (A) 07/05/2023 1934   Sepsis Labs: @LABRCNTIP (procalcitonin:4,lacticidven:4) ) Recent Results (from the past 240 hours)  Blood Culture (routine x 2)     Status: None   Collection Time: 07/24/23  1:23 PM   Specimen: BLOOD  Result Value Ref Range Status   Specimen Description BLOOD LEFT ANTECUBITAL  Final   Special Requests   Final    BOTTLES DRAWN AEROBIC ONLY Blood Culture adequate volume   Culture   Final    NO GROWTH 5 DAYS Performed at [facility], [address] [place], [place] [address]    Report Status 07/29/2023 FINAL  Final  Blood Culture (routine x 2)     Status: None   Collection Time: 07/24/23  1:29 PM   Specimen: BLOOD  Result Value Ref Range Status   Specimen Description BLOOD BLOOD LEFT FOREARM  Final   Special Requests   Final  BOTTLES DRAWN AEROBIC AND ANAEROBIC Blood Culture results may not be optimal due to an inadequate volume of blood received in culture bottles   Culture   Final    NO GROWTH 5 DAYS Performed at [facility], [address], [place], [place] [address]    Report Status 07/29/2023 FINAL  Final  Resp panel by RT-PCR (RSV, Flu A&B, Covid) Anterior Nasal Swab     Status: Abnormal   Collection Time: 07/24/23  2:42 PM   Specimen: Anterior Nasal Swab  Result Value Ref Range Status   SARS Coronavirus 2 by RT PCR NEGATIVE NEGATIVE Final    Comment: (NOTE) SARS-CoV-2 target nucleic acids are NOT DETECTED.  The SARS-CoV-2 RNA is generally detectable in upper respiratory specimens during the acute phase of infection. The lowest concentration of SARS-CoV-2 viral copies this assay can detect is 138 copies/mL. A negative result does not preclude SARS-Cov-2 infection and should not be used as the sole basis for treatment or other patient management decisions. A negative result may occur with  improper specimen collection/handling, submission of specimen  other than nasopharyngeal swab, presence of viral mutation(s) within the areas targeted by this assay, and inadequate number of viral copies(<138 copies/mL). A negative result must be combined with clinical observations, patient history, and epidemiological information. The expected result is Negative.  Fact Sheet for Patients:  bloggercourse.com  Fact Sheet for Healthcare Providers:  seriousbroker.it  This test is no t yet approved or cleared by the [place]  FDA and  has been authorized for detection and/or diagnosis of SARS-CoV-2 by FDA under an Emergency Use Authorization (EUA). This EUA will remain  in effect (meaning this test can be used) for the duration of the COVID-19 declaration under Section 564(b)(1) of the Act, 21 U.S.C.section 360bbb-3(b)(1), unless the authorization is terminated  or revoked sooner.       Influenza A by PCR POSITIVE (A) NEGATIVE Final   Influenza B by PCR NEGATIVE NEGATIVE Final    Comment: (NOTE) The Xpert Xpress SARS-CoV-2/FLU/RSV plus assay is intended as an aid in the diagnosis of influenza from Nasopharyngeal swab specimens and should not be used as a sole basis for treatment. Nasal washings and aspirates are unacceptable for Xpert Xpress SARS-CoV-2/FLU/RSV testing.  Fact Sheet for Patients: bloggercourse.com  Fact Sheet for Healthcare Providers: seriousbroker.it  This test is not yet approved or cleared by the [place]  FDA and has been authorized for detection and/or diagnosis of SARS-CoV-2 by FDA under an Emergency Use Authorization (EUA). This EUA will remain in effect (meaning this test can be used) for the duration of the COVID-19 declaration under Section 564(b)(1) of the Act, 21 U.S.C. section 360bbb-3(b)(1), unless the authorization is terminated or revoked.     Resp Syncytial Virus by PCR NEGATIVE NEGATIVE Final     Comment: (NOTE) Fact Sheet for Patients: bloggercourse.com  Fact Sheet for Healthcare Providers: seriousbroker.it  This test is not yet approved or cleared by the [place]  FDA and has been authorized for detection and/or diagnosis of SARS-CoV-2 by FDA under an Emergency Use Authorization (EUA). This EUA will remain in effect (meaning this test can be used) for the duration of the COVID-19 declaration under Section 564(b)(1) of the Act, 21 U.S.C. section 360bbb-3(b)(1), unless the authorization is terminated or revoked.  Performed at [facility], [address], [place], [place] [address]   MRSA Next Gen by PCR, Nasal     Status: Abnormal   Collection Time: 07/25/23  1:20 PM   Specimen:  Nasal Mucosa; Nasal Swab  Result Value Ref Range Status   MRSA by PCR Next Gen DETECTED (A) NOT DETECTED Final    Comment: RESULT CALLED TO, READ BACK BY AND VERIFIED WITH: BREANNA,RN ON 07/25/23 AT 1913 BY PURDIE,J        The GeneXpert MRSA Assay (FDA approved for NASAL specimens only), is one component of a comprehensive MRSA colonization surveillance program. It is not intended to diagnose MRSA infection nor to guide or monitor treatment for MRSA infections. Performed at [facility], [address] [place], [place] [address]      Scheduled Meds:  apixaban   5 mg Oral BID   bisacodyl   10 mg Rectal Daily   budesonide  (PULMICORT ) nebulizer solution  0.25 mg Nebulization BID   buPROPion   150 mg Oral Daily   Chlorhexidine  Gluconate Cloth  6 each Topical Daily   diltiazem   60 mg Oral TID   escitalopram   20 mg Oral Daily   hydrOXYzine   25 mg Oral BID   ipratropium  0.5 mg Nebulization TID   levalbuterol   0.63 mg Nebulization TID   levothyroxine   50 mcg Oral QAC breakfast   magnesium  oxide  400 mg Oral Daily   metoprolol  tartrate  2.5 mg Intravenous Q6H   montelukast   10 mg Oral Daily   mupirocin  ointment  1 Application Nasal BID    pantoprazole   40 mg Oral Daily   rosuvastatin   20 mg Oral Daily   sodium chloride  flush  10-40 mL Intracatheter Q12H   sodium chloride  flush  10-40 mL Intracatheter Q12H   Continuous Infusions:  Procedures/Studies: ECHOCARDIOGRAM COMPLETE Result Date: 07/29/2023    ECHOCARDIOGRAM REPORT   Patient Name:   [name] Date of Exam: 07/29/2023 Medical Rec #:  [id number]      Height:       66.0 in Accession #:    [id number]     Weight:       199.1 lb Date of Birth:  1940-09-01       BSA:          1.996 m Patient Age:    82 years       BP:           147/104 mmHg Patient Gender: F              HR:           120 bpm. Exam Location:  Inpatient Procedure: 2D Echo, Cardiac Doppler and Color Doppler Indications:    A-fib  History:        Patient has prior history of Echocardiogram examinations, most                 recent 12/24/2017. CKD, Arrythmias:Atrial Fibrillation; Risk                 Factors:Hypertension, Diabetes, Dyslipidemia and Former Smoker.  Sonographer:    Tillman Nora RVT Referring Phys: [id number] JAN A MANSY  Sonographer Comments: Technically challenging study due to limited acoustic windows and Technically difficult study due to poor echo windows. Technically difficult due to elevated HR, patient supine, respiratory motion and patients inability to tolerate exam. HR between 120 bpm-140 bpm IMPRESSIONS  1. Left ventricular ejection fraction, by estimation, is 55 to 60%. The left ventricle has normal function. Left ventricular endocardial border not optimally defined to evaluate regional wall motion. There is mild left ventricular hypertrophy. Left ventricular diastolic parameters are indeterminate.  2. RV not well visualized, grossly appears normal in  size and function. . Right ventricular systolic function was not well visualized. The right ventricular size is not well visualized. Tricuspid regurgitation signal is inadequate for assessing PA pressure.  3. The mitral valve was not well visualized. Trivial  mitral valve regurgitation. No evidence of mitral stenosis.  4. The aortic valve was not well visualized. There is mild calcification of the aortic valve. There is mild thickening of the aortic valve. Aortic valve regurgitation is not visualized. No aortic stenosis is present.  5. The inferior vena cava is normal in size with greater than 50% respiratory variability, suggesting right atrial pressure of 3 mmHg.  6. Technically difficult study in setting of limited visualization and elevated heart rates. FINDINGS  Left Ventricle: Left ventricular ejection fraction, by estimation, is 55 to 60%. The left ventricle has normal function. Left ventricular endocardial border not optimally defined to evaluate regional wall motion. The left ventricular internal cavity size was normal in size. There is mild left ventricular hypertrophy. Left ventricular diastolic parameters are indeterminate. Right Ventricle: RV not well visualized, grossly appears normal in size and function. The right ventricular size is not well visualized. Right vetricular wall thickness was not well visualized. Right ventricular systolic function was not well visualized.  Tricuspid regurgitation signal is inadequate for assessing PA pressure. Left Atrium: Left atrial size was normal in size. Right Atrium: Right atrial size was normal in size. Pericardium: There is no evidence of pericardial effusion. Mitral Valve: The mitral valve was not well visualized. Trivial mitral valve regurgitation. No evidence of mitral valve stenosis. Tricuspid Valve: The tricuspid valve is not well visualized. Tricuspid valve regurgitation is trivial. No evidence of tricuspid stenosis. Aortic Valve: The aortic valve was not well visualized. There is mild calcification of the aortic valve. There is mild thickening of the aortic valve. There is mild aortic valve annular calcification. Aortic valve regurgitation is not visualized. No aortic stenosis is present. Aortic valve mean  gradient measures 2.8 mmHg. Aortic valve peak gradient measures 5.3 mmHg. Aortic valve area, by VTI measures 2.17 cm. Pulmonic Valve: The pulmonic valve was not well visualized. Pulmonic valve regurgitation is not visualized. No evidence of pulmonic stenosis. Aorta: The aortic root and ascending aorta are structurally normal, with no evidence of dilitation. Venous: The inferior vena cava is normal in size with greater than 50% respiratory variability, suggesting right atrial pressure of 3 mmHg. IAS/Shunts: No atrial level shunt detected by color flow Doppler.  LEFT VENTRICLE PLAX 2D LVIDd:         4.10 cm LVIDs:         2.80 cm LV PW:         1.20 cm LV IVS:        1.20 cm LVOT diam:     2.00 cm LV SV:         32 LV SV Index:   16 LVOT Area:     3.14 cm  IVC IVC diam: 1.80 cm LEFT ATRIUM           Index LA diam:      3.70 cm 1.85 cm/m LA Vol (A4C): 34.9 ml 17.49 ml/m  AORTIC VALVE                    PULMONIC VALVE AV Area (Vmax):    2.30 cm     PV Vmax:       0.88 m/s AV Area (Vmean):   2.18 cm     PV Peak grad:  3.1 mmHg AV Area (VTI):     2.17 cm AV Vmax:           114.60 cm/s AV Vmean:          79.140 cm/s AV VTI:            0.148 m AV Peak Grad:      5.3 mmHg AV Mean Grad:      2.8 mmHg LVOT Vmax:         83.87 cm/s LVOT Vmean:        54.867 cm/s LVOT VTI:          0.102 m LVOT/AV VTI ratio: 0.69  AORTA Ao Root diam: 2.20 cm Ao Asc diam:  3.40 cm  SHUNTS Systemic VTI:  0.10 m Systemic Diam: 2.00 cm Dorn Ross MD Electronically signed by Dorn Ross MD Signature Date/Time: 07/29/2023/12:24:49 PM    Final    DG Chest Portable 1 View Result Date: 07/25/2023 CLINICAL DATA:  PICC line placement EXAM: PORTABLE CHEST 1 VIEW COMPARISON:  Chest radiograph and CT chest, abdomen, and pelvis dated 07/24/2023. FINDINGS: Right PICC line tip overlies the lower SVC. Low lung volumes. Stable cardiomegaly. Aortic atherosclerosis. Similar indistinctness of the left costophrenic angle, likely correlates with  prominent subpleural fat noted on the prior CT. No focal consolidation, sizeable pleural effusion, or pneumothorax. The visualized osseous structures are unchanged. IMPRESSION: 1. Right PICC line tip overlies the lower SVC. 2. Low lung volumes.  No acute cardiopulmonary findings. Electronically Signed   By: Harrietta Sherry M.D.   On: 07/25/2023 13:11   US  EKG SITE RITE Result Date: 07/25/2023 If Site Rite image not attached, placement could not be confirmed due to current cardiac rhythm.  CT CHEST ABDOMEN PELVIS W CONTRAST Result Date: 07/24/2023 CLINICAL DATA:  Sepsis. Urinary tract infection. Previous appendectomy, hysterectomy, oophorectomy and cholecystectomy. EXAM: CT CHEST, ABDOMEN, AND PELVIS WITH CONTRAST TECHNIQUE: Multidetector CT imaging of the chest, abdomen and pelvis was performed following the standard protocol during bolus administration of intravenous contrast. RADIATION DOSE REDUCTION: This exam was performed according to the departmental dose-optimization program which includes automated exposure control, adjustment of the mA and/or kV according to patient size and/or use of iterative reconstruction technique. CONTRAST:  OMNIPAQUE  IOHEXOL  300 MG/ML  SOLN COMPARISON:  Abdomen and pelvis CT dated 04/24/2018. Portable chest obtained earlier today. Chest CT report dated 05/15/2019. Chest CTA dated 10/18/2008. FINDINGS: CT CHEST FINDINGS Cardiovascular: Stable mildly enlarged heart aortic valve calcifications and dense mitral valve annulus calcifications. Atheromatous calcifications, including the coronary arteries and aorta. The central pulmonary arteries remain mildly dilated with a main pulmonary artery diameter of 3.4 cm. Mediastinum/Nodes: No enlarged mediastinal, hilar, or axillary lymph nodes. Interval flattening of the trachea and mainstem bronchi with an interval decreased depth of inspiration. Unremarkable thyroid  gland and esophagus. Lungs/Pleura: Decreased depth of inspiration.  Mild bilateral lower lobe cylindrical bronchiectasis and associated mild parenchymal densities. Prominent subpleural fat on the left causing the blunting of the lateral costophrenic angle seen radiographically. No pleural fluid Musculoskeletal: Thoracic spine degenerative changes multiple thoracic vertebral compression deformities with mild bony retropulsion and no visible acute fracture lines. Associated acute kyphosis in the upper thoracic spine. CT ABDOMEN PELVIS FINDINGS Hepatobiliary: No focal liver abnormality is seen. Status post cholecystectomy. No biliary dilatation. Pancreas: Unremarkable. No pancreatic ductal dilatation or surrounding inflammatory changes. Spleen: Normal in size without focal abnormality. Adrenals/Urinary Tract: Normal-appearing adrenal glands multiple bilateral simple appearing renal cysts. These do not need imaging follow-up unremarkable  urinary bladder and ureters Stomach/Bowel: Large number of sigmoid colon diverticula without evidence of diverticulitis. Breathing motion blurring obscuring the detail in the right lower quadrant of the abdomen. There is some fluid in gas in that region of the abdomen that is most likely intraluminal. However, due to the motion artifact, it is difficult to exclude a fluid or gas collection in that area. Surgically absent appendix by history. Unremarkable stomach. Vascular/Lymphatic: Atheromatous arterial calcifications without aneurysm. No enlarged lymph nodes. Reproductive: Status post hysterectomy. No adnexal masses. Other: Anterior subcutaneous edema and anterior subcutaneous air, compatible with recent subcutaneous injection. Moderate-sized left inguinal hernia containing fat and small to moderate-sized right inguinal hernia containing fat Musculoskeletal: Lumbar spine degenerative changes. Multiple lumbar spine vertebral compression deformities that are new since 04/24/2018. Stable old T12 vertebral compression deformity. Possible acute fracture  line in the anterior, superior corner of the L2 vertebral body and possible acute fracture line in the superior endplate of the L4 vertebral body. IMPRESSION: 1. Fluid and gas in the right lower quadrant of the abdomen which is most likely within the bowel. However, this can not be confirmed as intraluminal due to motion artifacts in that area. Correlation with the presence or absence of focal pain and tenderness in that area is recommended to help exclude the possibility of an abscess or contained perforation in the right lower quadrant of the abdomen. 2. Otherwise, no acute abnormality seen. 3. Decreased depth of inspiration with flattening of the trachea and mainstem bronchi, compatible with tracheobronchomalacia. 4. Mild bilateral lower lobe cylindrical bronchiectasis and associated mild parenchymal densities, most likely due to atelectasis. 5. Stable mild cardiomegaly. 6. Mildly dilated central pulmonary arteries, suggesting pulmonary arterial hypertension. 7. Multiple thoracic and lumbar spine vertebral compression deformities, some of which are new since 04/24/2018. Possible acute fracture lines in the anterior, superior corner of the L2 vertebral body and superior endplate of the L4 vertebral body. 8. Sigmoid colon diverticulosis. 9. Moderate-sized left inguinal hernia containing fat and small to moderate-sized right inguinal hernia containing fat. 10. Aortic atherosclerosis. Aortic Atherosclerosis (ICD10-I70.0). Electronically Signed   By: Elspeth Bathe M.D.   On: 07/24/2023 16:45   CT Head Wo Contrast Result Date: 07/24/2023 CLINICAL DATA:  Mental status change. Current therapy for urinary tract infection. EXAM: CT HEAD WITHOUT CONTRAST TECHNIQUE: Contiguous axial images were obtained from the base of the skull through the vertex without intravenous contrast. RADIATION DOSE REDUCTION: This exam was performed according to the departmental dose-optimization program which includes automated exposure  control, adjustment of the mA and/or kV according to patient size and/or use of iterative reconstruction technique. COMPARISON:  01/05/2023 FINDINGS: Brain: No evidence of acute infarction, hemorrhage, hydrocephalus, extra-axial collection or mass lesion/mass effect. Vascular: Encephalomalacia within the right occipital and posterior temporal lobes consistent with remote PCA infarct. Ex vacuo dilatation of the right posterolateral ventricle noted. There is mild diffuse low-attenuation within the subcortical and periventricular white matter compatible with chronic microvascular disease. Skull: Normal. Negative for fracture or focal lesion. Sinuses/Orbits: Air-fluid level within the sphenoid sinus. Partial opacification of the ethmoid air cells. Other: None. IMPRESSION: 1. No acute intracranial abnormalities. 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 4. Air-fluid level within the sphenoid sinus. Correlate for any clinical signs or symptoms of acute sinusitis. Electronically Signed   By: Waddell Calk M.D.   On: 07/24/2023 16:15   DG Chest Port 1 View Result Date: 07/24/2023 CLINICAL DATA:  Questionable sepsis - evaluate for abnormality. Weakness. Altered mental status.  EXAM: PORTABLE CHEST 1 VIEW COMPARISON:  01/05/2023. FINDINGS: Redemonstration of blunting of left lateral costophrenic angle, similar to the prior study, which may represent small left pleural effusion. There are probable associated atelectatic changes at the left lung base. Bilateral lung fields are otherwise clear. No acute consolidation or lung collapse. Right lateral costophrenic angle is clear. Stable cardio-mediastinal silhouette. No acute osseous abnormalities. The soft tissues are within normal limits. There are surgical clips in the right upper quadrant, typical of a previous cholecystectomy. IMPRESSION: *Persistent blunting of left lateral costophrenic angle, which may represent small left pleural effusion. *Otherwise  no acute cardiopulmonary abnormality seen. Electronically Signed   By: Ree Molt M.D.   On: 07/24/2023 14:16   DG Pelvis 1-2 Views Result Date: 07/05/2023 CLINICAL DATA:  Status post fall. EXAM: PELVIS - 1-2 VIEW COMPARISON:  None Available. FINDINGS: There is no evidence of pelvic fracture or diastasis. No pelvic bone lesions are seen. Mild degenerative changes are seen involving both hips in the form of joint space narrowing and acetabular sclerosis. IMPRESSION: Mild degenerative changes involving both hips. Electronically Signed   By: Suzen Dials M.D.   On: 07/05/2023 20:35   DG Knee Complete 4 Views Left Result Date: 07/05/2023 CLINICAL DATA:  Witnessed fall. EXAM: LEFT KNEE - COMPLETE 4+ VIEW COMPARISON:  None Available. FINDINGS: No evidence of an acute fracture, dislocation, or joint effusion. Medial marginal osteophytes are noted. There is moderate to marked severity tricompartmental joint space narrowing. Soft tissues are unremarkable. 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 Electronically Signed   By: Suzen Dials M.D.   On: 07/05/2023 20:33   DG Knee Complete 4 Views Right Result Date: 07/05/2023 CLINICAL DATA:  Status post fall. EXAM: RIGHT KNEE - COMPLETE 4+ VIEW COMPARISON:  None Available. FINDINGS: No evidence of an acute fracture, dislocation, or joint effusion. A small, chronic appearing cortical deformity versus medial marginal osteophyte is seen along the proximal right tibia. Moderate to marked severity tricompartmental joint space narrowing is seen. Soft tissues are unremarkable. 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 Electronically Signed   By: Suzen Dials M.D.   On: 07/05/2023 20:31   DG Forearm Right Result Date: 07/05/2023 CLINICAL DATA:  Status post fall. EXAM: RIGHT FOREARM - 2 VIEW COMPARISON:  None Available. FINDINGS: There is no evidence of fracture or other focal bone  lesions. Moderate to marked severity focal soft tissue swelling is seen along the dorsal aspect of the mid right forearm. IMPRESSION: Moderate to marked severity focal soft tissue swelling along the dorsal aspect of the mid right forearm. Electronically Signed   By: Suzen Dials M.D.   On: 07/05/2023 20:26    Alm Schneider, DO  [facility]  If 7PM-7AM, please contact night-coverage [email or web address] Password [email or web address] 07/30/2023, 9:51 AM   LOS: 6 days

## 2023-07-30 NOTE — Plan of Care (Signed)
  Problem: Health Behavior/Discharge Planning: Goal: Ability to manage health-related needs will improve Outcome: Not Progressing   Problem: Coping: Goal: Level of anxiety will decrease Outcome: Not Progressing   

## 2023-07-30 NOTE — Progress Notes (Addendum)
 Pharmacy Antibiotic Note  [name] is a 83 y.o. female admitted on 07/24/2023 with pneumonia.  Pharmacy has been consulted for vancomycin  dosing.  Plan: Vancomycin  1750 mg IV x 1 dose Vancomycin  1000 mg IV every 24 hours. Zosyn  3.375g IV every 8 hours. Monitor labs, c/s, and vanco levels as indicated.  Height: 5' 6 (167.6 cm) Weight: 90.3 kg (199 lb 1.2 oz) IBW/kg (Calculated) : 59.3  Temp (24hrs), Avg:97.9 F (36.6 C), Min:97.5 F (36.4 C), Max:98.2 F (36.8 C)  Recent Labs  Lab 07/24/23 1323 07/24/23 1444 07/25/23 0403 07/26/23 0753 07/27/23 0834 07/30/23 1008  WBC 3.8*  --  4.6 4.4 3.2* 10.4  CREATININE 0.67  --  0.55 0.44 0.47 0.47  LATICACIDVEN 1.3 1.4  --   --   --   --     Estimated Creatinine Clearance: 59.3 mL/min (by C-G formula based on SCr of 0.47 mg/dL).    Allergies  Allergen Reactions   Cefprozil  Nausea And Vomiting    Unknown   Demerol  Nausea And Vomiting   Meperidine  Hcl Other (See Comments)    Unknown    Antimicrobials this admission: Vanco 2/5 >>  Zosyn  2/5 >>   Microbiology results: 1/30 BCx: ng 1/31 MRSA PCR: positive Influenza A +  Thank you for allowing pharmacy to be a part of this patient's care.  Elspeth Sour, PharmD Clinical Pharmacist 07/30/2023 4:54 PM

## 2023-07-30 NOTE — Progress Notes (Signed)
 Palliative: Mrs. Ostrosky, Dennice, is lying quietly in bed.  She appears acutely/chronically ill and frail, obese.  She will briefly open her eyes, but not make eye contact.  She has known memory loss and its extremely hard of hearing.  I do not believe that she can make her basic needs known.  Her brother/HCPOA, [name], is present at bedside along with a friend Marinell.  We talk in detail about Jawanna's acute and chronic health concerns.  We talk about her flu and the treatment plan, 6 days in the hospital completed Tamiflu , no meaningful improvements.  We talked about her respiratory status, hypoventilation.  We talk about her cardiac issues, increased heart rate and cardiology consult and the treatment plan.  We talked about Jasline's poor by mouth intake.  I shared that we must eat to live.  Lytle states that he would not want artificial feeding for his sister.  He and Marinell ask appropriate questions.  Eddie asks how long Signe can stay in the hospital.  I shared that she can stay as long as she is receiving care that can only be provided in the hospital.  He asks about her returning to [facility] where she has been for the last 5 years, bedbound for 3.  I shared that she could return to long-term care with hospice if she is not eating, but they could not accept her unless she is able to eat/take in nutrition.    Lytle and Marinell ask appropriate questions about residential hospice.  I shared that people go there to let nature take its course.  I shared that I believe that [place] would qualify for hospice care because she is not eating.  I ask Lytle if he has considered his sisters mortality.  He breaks down crying at the bedside.  Marinell shares that Lytle has lost many siblings.  We talked about how to make choices for loved ones including 1) keeping them at the center of decision-making, not what we want for them 2) are we doing something for them, or to them, can we change what is happening 3) the person  Rachell was 10 years ago, how would that sister tell him to care for her now.  I encouraged him to think about, pray about what is next for Noor.  PMT to follow.  Conference with attending, bedside nursing staff, transition of care team related to patient condition, needs, goals of care, disposition.  Plan:   Continue to treat the treatable but no CPR or intubation.  Requesting 24 to 48 hours more for outcomes. PMT to follow.   55 minutes  Lorenza Birkenhead, NP Palliative medicine team Team phone [phone]

## 2023-07-30 NOTE — Progress Notes (Addendum)
 Patient Name: [name] Date of Encounter: 07/30/2023 [facility] Cardiologist: [name] Sierras, MD   Interval Summary  .    Awakens and opens eyes to verbal stimuli but quickly falls back asleep. Unable to obtain ROS.   Vital Signs .    Vitals:   07/29/23 2300 07/30/23 0621 07/30/23 0759 07/30/23 1045  BP:  128/78  134/86  Pulse:  96  85  Resp: 16 20    Temp:  (!) 97.5 F (36.4 C)  98.1 F (36.7 C)  TempSrc:  Axillary  Axillary  SpO2:  94% 95% 98%  Weight:      Height:        Intake/Output Summary (Last 24 hours) at 07/30/2023 1131 Last data filed at 07/30/2023 1037 Gross per 24 hour  Intake 10 ml  Output 150 ml  Net -140 ml      07/29/2023    6:12 AM 07/27/2023    5:26 AM 07/26/2023    4:00 AM  Last 3 Weights  Weight (lbs) 199 lb 1.2 oz 200 lb 9.9 oz 200 lb 9.9 oz  Weight (kg) 90.3 kg 91 kg 91 kg      Telemetry/ECG/Cardiac Studies    Telemetry: Rate-controlled atrial fibrillation, HR in 70's to 90's.  - Personally Reviewed  Echocardiogram: 07/29/2023 IMPRESSIONS    1. Left ventricular ejection fraction, by estimation, is 55 to 60%. The  left ventricle has normal function. Left ventricular endocardial border  not optimally defined to evaluate regional wall motion. There is mild left  ventricular hypertrophy. Left  ventricular diastolic parameters are indeterminate.   2. RV not well visualized, grossly appears normal in size and function. .  Right ventricular systolic function was not well visualized. The right  ventricular size is not well visualized. Tricuspid regurgitation signal is  inadequate for assessing PA  pressure.   3. The mitral valve was not well visualized. Trivial mitral valve  regurgitation. No evidence of mitral stenosis.   4. The aortic valve was not well visualized. There is mild calcification  of the aortic valve. There is mild thickening of the aortic valve. Aortic  valve regurgitation is not visualized. No aortic stenosis is  present.   5. The inferior vena cava is normal in size with greater than 50%  respiratory variability, suggesting right atrial pressure of 3 mmHg.   6. Technically difficult study in setting of limited visualization and  elevated heart rates.    Physical Exam .    GEN: Chronically-ill appearing female, lying in bed. Awakens to verbal stimuli.  Neck: No JVD Cardiac: Irregularly irregular. No significant murmur.  Respiratory: Expiratory wheezing along upper lung fields and scattered rhonchi. Patient unable to lean forward.  GI: Soft, nontender, non-distended  MS: No pitting edema  Assessment & Plan .     1. Persistent Atrial Fibrillation - Previously had a history of paroxysmal atrial fibrillation but felt to be more persistent as noted on EKG tracings in 2020 and 2023. Was previously rate-controlled prior to admission with Cardizem  120mg  daily (not listed as CD) but rates elevated on admission and likely due to her acute illness. Was started on IV Amiodarone  but discontinued by Dr. Alvan yesterday given her persistent arrhythmia with plans for rate-control. Was switched to short-acting Cardizem  60 mg TID and rates have been well-controlled in the 70's to 90's. Was not taking PO medications last night but was able to take this morning. If unable to take PO medications due to mental status,  could use divided dose IV Lopressor .  - Remains on Eliquis  5mg  BID for anticoagulation. Also on this due to prior PE.   2. CAD - She has a history of DES to the LAD and DES to D2. She is bed-bound at baseline but no reports of recent chest pain. Unable to elicit history from the patient today. Echo this admission shows a preserved EF as discussed above.  - She is not on ASA given the need for anticoagulation. Remains on Crestor  20mg  daily.   3. Chronic HFpEF - Echo this admission shows a preserved EF of 55-60% with mild LVH and normal RV function but limited visualization and HR was also elevated at the  time of the study. She does not appear volume overloaded by examination and given her minimal PO intake, would hold on diuretics for now.   4. Dementia - By review of notes, she has dementia at baseline and is bed-bound. Palliative Care following and having GOC discussions with the patient's family. Currently DNR.   5. Influenza A - Completed course of Tamiflu . Management per the admitting team.    For questions or updates, please contact [facility] Please consult [email or web address] for contact info under      Signed, [name] CHRISTELLA Qua, PA-C    Attending note:  Patient seen and examined.  [name] does not provide any specific complaints, history complicated by dementia.  I reviewed the chart and recent medication adjustments.  She is afebrile, heart rate in the 80-100 range in atrial fibrillation by telemetry, blood pressure 134/86.  Patient awakens to verbal stimuli but does not provide intelligible comments.  Cardiac exam with irregularly irregular rhythm.  Pertinent lab work includes potassium 3.3, BUN 5, creatinine 0.47, hemoglobin 11.7, platelets 216, TSH 2.332.  Persistent atrial fibrillation, would manage as permanent atrial fibrillation in terms of heart rate control and anticoagulation.  Presently on Eliquis  and short acting Cardizem  60 mg p.o. every 8 hours.  Palliative care also involved in terms of GOC.  No further cardiac testing is planned at this time.  We will sign off.  [name] [name], M.D., F.A.C.C.

## 2023-07-31 ENCOUNTER — Inpatient Hospital Stay (HOSPITAL_COMMUNITY): Payer: Medicare (Managed Care)

## 2023-07-31 DIAGNOSIS — I4891 Unspecified atrial fibrillation: Secondary | ICD-10-CM | POA: Diagnosis not present

## 2023-07-31 DIAGNOSIS — Z7189 Other specified counseling: Secondary | ICD-10-CM | POA: Diagnosis not present

## 2023-07-31 DIAGNOSIS — Z515 Encounter for palliative care: Secondary | ICD-10-CM | POA: Diagnosis not present

## 2023-07-31 DIAGNOSIS — G9340 Encephalopathy, unspecified: Secondary | ICD-10-CM | POA: Diagnosis not present

## 2023-07-31 DIAGNOSIS — J101 Influenza due to other identified influenza virus with other respiratory manifestations: Secondary | ICD-10-CM | POA: Diagnosis not present

## 2023-07-31 LAB — BLOOD GAS, VENOUS
Acid-Base Excess: 8.9 mmol/L — ABNORMAL HIGH (ref 0.0–2.0)
Bicarbonate: 35.5 mmol/L — ABNORMAL HIGH (ref 20.0–28.0)
Drawn by: 53324
O2 Saturation: 62.4 %
Patient temperature: 36.5
pCO2, Ven: 55 mm[Hg] (ref 44–60)
pH, Ven: 7.42 (ref 7.25–7.43)
pO2, Ven: 32 mm[Hg] (ref 32–45)

## 2023-07-31 LAB — CBC
HCT: 37.1 % (ref 36.0–46.0)
Hemoglobin: 11 g/dL — ABNORMAL LOW (ref 12.0–15.0)
MCH: 26.1 pg (ref 26.0–34.0)
MCHC: 29.6 g/dL — ABNORMAL LOW (ref 30.0–36.0)
MCV: 88.1 fL (ref 80.0–100.0)
Platelets: 250 10*3/uL (ref 150–400)
RBC: 4.21 MIL/uL (ref 3.87–5.11)
RDW: 16.2 % — ABNORMAL HIGH (ref 11.5–15.5)
WBC: 7.3 10*3/uL (ref 4.0–10.5)
nRBC: 0 % (ref 0.0–0.2)

## 2023-07-31 LAB — BASIC METABOLIC PANEL
Anion gap: 9 (ref 5–15)
BUN: 7 mg/dL — ABNORMAL LOW (ref 8–23)
CO2: 31 mmol/L (ref 22–32)
Calcium: 8.4 mg/dL — ABNORMAL LOW (ref 8.9–10.3)
Chloride: 103 mmol/L (ref 98–111)
Creatinine, Ser: 0.62 mg/dL (ref 0.44–1.00)
GFR, Estimated: >60 mL/min (ref >60)
Glucose, Bld: 70 mg/dL (ref 70–99)
Potassium: 3 mmol/L — ABNORMAL LOW (ref 3.5–5.1)
Sodium: 143 mmol/L (ref 135–145)

## 2023-07-31 LAB — BRAIN NATRIURETIC PEPTIDE: B Natriuretic Peptide: 279 pg/mL — ABNORMAL HIGH (ref 0.0–100.0)

## 2023-07-31 LAB — MAGNESIUM: Magnesium: 1.8 mg/dL (ref 1.7–2.4)

## 2023-07-31 MED ORDER — POTASSIUM CHLORIDE 10 MEQ/100ML IV SOLN
10.0000 meq | INTRAVENOUS | Status: AC
  Administered 2023-07-31 (×2): 10 meq via INTRAVENOUS
  Filled 2023-07-31 (×2): qty 100

## 2023-07-31 NOTE — Progress Notes (Signed)
 Patient is coughing more with PO and not wanting to swallow the crushed medications mixed with applesauce once they are in her mouth. Performed oral care with [employer] mouthwash her lips are cracking and tongue is dry.

## 2023-07-31 NOTE — Progress Notes (Signed)
 No morning medications given due to patients level of consciousness , she is awake at times however not awake enough that writer feels safe to given any PO medication or food. Dr, Tat made aware

## 2023-07-31 NOTE — Progress Notes (Signed)
 PROGRESS NOTE  [name] FMW:[id number] DOB: 1940-10-04 DOA: 07/24/2023 PCP: [name] Elsie RAMAN, MD (Inactive)  Brief History:  83 y.o. female, with a history of paroxysmal atrial fibrillation, diabetes mellitus type 2, hypertension, dysphagia, pulmonary embolus, hypothyroidism, and esophageal dysmotility, dementia, living at SNF for last 5 years, patient with advanced dementia at baseline, bedbound as discussed with family. -Patient was brought by ED secondary to altered mental status, increased lethargy, patient was noted to be altered since yesterday, apparently she is on antibiotics for UTI, as discussed with family, she is with advanced dementia, but usually communicative, remember some family members, bedbound at baseline.   -In ED she was altered, CT head with no acute findings, significant for chronic infarcts, CT abdomen pelvics with questionable right lower quadrant gas-fluid collection, likely within the bowels, but due to emotional artifact could not rule out abscess or perforated fluid collection, her respiratory panel significant for positive influenza A, [facility] hospitalist consulted to admit   Assessment/Plan: Acute metabolic encephalopathy -due to infectious process, including influenza -remains confused and agitated -CT head with no acute findings -Per reports patient was treated for UTI at facility prior to arrival  -palliative medicine consultation due to failure to thrive in setting of dementia -B12 1363 -folate 9.1 -TSH 2.332 -ammonia 14 -obtain MR brain   Influenza infection -continue Tamiflu --completed course -1/30 CT chest--Mild bilateral lower lobe cylindrical bronchiectasis and associated mild parenchymal densities. -check PCT <0.10  Aspiration pneumonitis -personally reviewed CXR 2/5--RUL opacity, bibasilar atelectasis -start zosyn  -MRSA+   Persistent A-fib with RVR - resolved  -On apixaban  for anticoagulation -currently off Cardizem  drip  and back on oral cardizem   -recurrence of Afib RVR overnight 2/4, started on IV amio by night provider, cardiology consulted and now back on oral diltiazem  and off IV amiodarone    Questionable right lower quadrant fluid collection, likely just just fluid-gas most likely within the bowel -CT abdomen showing fluid and gas in the right lower quadrant of the abdomen, possibly within bowel, been difficult to confirm due to motion artifact , mentation to exclude possibility of abscess or contained perforation -Consulted general surgery - see consult notes  -advanced to clears diet per surgeon -ok to discontinue Zosyn  per surgeon -ok to resume apixaban  per surgeon -surgery signing off 2/2. Ok for diet; advanced to dys 1 on 2/2    History of PE -Resumed apixaban     Major neurocognitive disorder Deconditioning -Bedbound at baseline -Continue with supportive care -palliative consultation for goals of care given no meaningful improvement from maximal medical care   Hyperlipidemia -continue with statin   Hypothyroidism -Continue with Synthroid    Hypertension -Continue with Cardizem     Right forearm fluid collection likely hematoma -Is not warm or tender, likely related to hematoma from recent fall, ED physician attempted to aspirate only small amount of blood came out  Hypokalemia -replete -mag 2.0  Goals of care -2/5--discussed with brother -2/5 brother confirms DNR, continue treat any reversible or treatable conditions; if no improvement in next 48 hours, would consider transition to comfort measures       Family Communication:   brother updated 2/6  Consultants:  palliative  Code Status: DNR  DVT Prophylaxis:  apixaban    Procedures: As Listed in Progress Note Above  Antibiotics: None  RN Pressure Injury Documentation:        Subjective: ROS not possible due to acute encephalopathy  Objective: Vitals:   07/30/23 1535 07/30/23 2016 07/30/23  2120 07/31/23  0447  BP: 118/85 107/80  123/85  Pulse: 73 97  64  Resp:  16  20  Temp: 97.7 F (36.5 C) 97.7 F (36.5 C)  97.7 F (36.5 C)  TempSrc: Axillary   Oral  SpO2: 100% 98% 96% 99%  Weight:      Height:        Intake/Output Summary (Last 24 hours) at 07/31/2023 0846 Last data filed at 07/31/2023 0604 Gross per 24 hour  Intake 650 ml  Output 400 ml  Net 250 ml   Weight change:  Exam:  General:  Pt is alert, does not follow commands appropriately, not in acute distress HEENT: No icterus, No thrush, No neck mass, [name]/AT Cardiovascular: RRR, S1/S2, no rubs, no gallops Respiratory: upper airway wheeze. Abdomen: Soft/+BS, non tender, non distended, no guarding Extremities: No edema, No lymphangitis, No petechiae, No rashes, no synovitis   Data Reviewed: I have personally reviewed following labs and imaging studies Basic Metabolic Panel: Recent Labs  Lab 07/24/23 1323 07/25/23 0403 07/26/23 0753 07/27/23 0834 07/29/23 0444 07/30/23 1008  NA 141 142 138 141  --  143  K 3.7 4.3 3.5 3.6  --  3.3*  CL 105 107 105 105  --  102  CO2 27 25 26 30   --  29  GLUCOSE 87 67* 80 82  --  80  BUN 11 11 9  6*  --  5*  CREATININE 0.67 0.55 0.44 0.47  --  0.47  CALCIUM  8.6* 8.5* 8.2* 8.5*  --  8.3*  MG  --   --   --   --  1.7 2.0   Liver Function Tests: Recent Labs  Lab 07/24/23 1323 07/30/23 1008  AST 23 17  ALT 10 10  ALKPHOS 84 67  BILITOT 0.6 0.9  PROT 6.8 6.0*  ALBUMIN  2.8* 2.7*   No results for input(s): LIPASE, AMYLASE in the last 168 hours. Recent Labs  Lab 07/30/23 1008  AMMONIA 14   Coagulation Profile: Recent Labs  Lab 07/24/23 1323  INR 1.3*   CBC: Recent Labs  Lab 07/24/23 1323 07/25/23 0403 07/26/23 0753 07/27/23 0834 07/30/23 1008  WBC 3.8* 4.6 4.4 3.2* 10.4  NEUTROABS 2.7  --   --   --   --   HGB 12.7 11.7* 9.1* 10.9* 11.7*  HCT 42.5 40.7 30.9* 37.1 39.3  MCV 90.6 93.6 90.6 90.5 88.3  PLT 225 210 165 177 216   Cardiac Enzymes: No results for  input(s): CKTOTAL, CKMB, CKMBINDEX, TROPONINI in the last 168 hours. BNP: Invalid input(s): POCBNP CBG: Recent Labs  Lab 07/25/23 0647 07/26/23 0737  GLUCAP 159* 81   HbA1C: No results for input(s): HGBA1C in the last 72 hours. Urine analysis:    Component Value Date/Time   COLORURINE YELLOW 07/05/2023 1934   APPEARANCEUR CLOUDY (A) 07/05/2023 1934   LABSPEC 1.018 07/05/2023 1934   PHURINE 6.0 07/05/2023 1934   GLUCOSEU NEGATIVE 07/05/2023 1934   HGBUR NEGATIVE 07/05/2023 1934   BILIRUBINUR NEGATIVE 07/05/2023 1934   KETONESUR 5 (A) 07/05/2023 1934   PROTEINUR NEGATIVE 07/05/2023 1934   UROBILINOGEN 0.2 05/08/2014 2240   NITRITE NEGATIVE 07/05/2023 1934   LEUKOCYTESUR LARGE (A) 07/05/2023 1934   Sepsis Labs: @LABRCNTIP (procalcitonin:4,lacticidven:4) ) Recent Results (from the past 240 hours)  Blood Culture (routine x 2)     Status: None   Collection Time: 07/24/23  1:23 PM   Specimen: BLOOD  Result Value Ref Range Status   Specimen Description BLOOD  LEFT ANTECUBITAL  Final   Special Requests   Final    BOTTLES DRAWN AEROBIC ONLY Blood Culture adequate volume   Culture   Final    NO GROWTH 5 DAYS Performed at [facility], [address], [place], [place] [address]    Report Status 07/29/2023 FINAL  Final  Blood Culture (routine x 2)     Status: None   Collection Time: 07/24/23  1:29 PM   Specimen: BLOOD  Result Value Ref Range Status   Specimen Description BLOOD BLOOD LEFT FOREARM  Final   Special Requests   Final    BOTTLES DRAWN AEROBIC AND ANAEROBIC Blood Culture results may not be optimal due to an inadequate volume of blood received in culture bottles   Culture   Final    NO GROWTH 5 DAYS Performed at [facility], [address], [place], [place] [address]    Report Status 07/29/2023 FINAL  Final  Resp panel by RT-PCR (RSV, Flu A&B, Covid) Anterior Nasal Swab     Status: Abnormal   Collection Time: 07/24/23  2:42 PM   Specimen: Anterior Nasal  Swab  Result Value Ref Range Status   SARS Coronavirus 2 by RT PCR NEGATIVE NEGATIVE Final    Comment: (NOTE) SARS-CoV-2 target nucleic acids are NOT DETECTED.  The SARS-CoV-2 RNA is generally detectable in upper respiratory specimens during the acute phase of infection. The lowest concentration of SARS-CoV-2 viral copies this assay can detect is 138 copies/mL. A negative result does not preclude SARS-Cov-2 infection and should not be used as the sole basis for treatment or other patient management decisions. A negative result may occur with  improper specimen collection/handling, submission of specimen other than nasopharyngeal swab, presence of viral mutation(s) within the areas targeted by this assay, and inadequate number of viral copies(<138 copies/mL). A negative result must be combined with clinical observations, patient history, and epidemiological information. The expected result is Negative.  Fact Sheet for Patients:  bloggercourse.com  Fact Sheet for Healthcare Providers:  seriousbroker.it  This test is no t yet approved or cleared by the [place]  FDA and  has been authorized for detection and/or diagnosis of SARS-CoV-2 by FDA under an Emergency Use Authorization (EUA). This EUA will remain  in effect (meaning this test can be used) for the duration of the COVID-19 declaration under Section 564(b)(1) of the Act, 21 U.S.C.section 360bbb-3(b)(1), unless the authorization is terminated  or revoked sooner.       Influenza A by PCR POSITIVE (A) NEGATIVE Final   Influenza B by PCR NEGATIVE NEGATIVE Final    Comment: (NOTE) The Xpert Xpress SARS-CoV-2/FLU/RSV plus assay is intended as an aid in the diagnosis of influenza from Nasopharyngeal swab specimens and should not be used as a sole basis for treatment. Nasal washings and aspirates are unacceptable for Xpert Xpress SARS-CoV-2/FLU/RSV testing.  Fact Sheet for  Patients: bloggercourse.com  Fact Sheet for Healthcare Providers: seriousbroker.it  This test is not yet approved or cleared by the [place]  FDA and has been authorized for detection and/or diagnosis of SARS-CoV-2 by FDA under an Emergency Use Authorization (EUA). This EUA will remain in effect (meaning this test can be used) for the duration of the COVID-19 declaration under Section 564(b)(1) of the Act, 21 U.S.C. section 360bbb-3(b)(1), unless the authorization is terminated or revoked.     Resp Syncytial Virus by PCR NEGATIVE NEGATIVE Final    Comment: (NOTE) Fact Sheet for Patients: bloggercourse.com  Fact Sheet for Healthcare Providers: seriousbroker.it  This test is not yet approved or cleared by the [place]  FDA and has been authorized for detection and/or diagnosis of SARS-CoV-2 by FDA under an Emergency Use Authorization (EUA). This EUA will remain in effect (meaning this test can be used) for the duration of the COVID-19 declaration under Section 564(b)(1) of the Act, 21 U.S.C. section 360bbb-3(b)(1), unless the authorization is terminated or revoked.  Performed at [facility], [address] [place], [place] [address]   MRSA Next Gen by PCR, Nasal     Status: Abnormal   Collection Time: 07/25/23  1:20 PM   Specimen: Nasal Mucosa; Nasal Swab  Result Value Ref Range Status   MRSA by PCR Next Gen DETECTED (A) NOT DETECTED Final    Comment: RESULT CALLED TO, READ BACK BY AND VERIFIED WITH: BREANNA,RN ON 07/25/23 AT 1913 BY PURDIE,J        The GeneXpert MRSA Assay (FDA approved for NASAL specimens only), is one component of a comprehensive MRSA colonization surveillance program. It is not intended to diagnose MRSA infection nor to guide or monitor treatment for MRSA infections. Performed at [facility], [address], [place], [place] [address]       Scheduled Meds:  apixaban   5 mg Oral BID   bisacodyl   10 mg Rectal Daily   budesonide  (PULMICORT ) nebulizer solution  0.25 mg Nebulization BID   buPROPion   150 mg Oral Daily   Chlorhexidine  Gluconate Cloth  6 each Topical Daily   diltiazem   60 mg Oral TID   escitalopram   20 mg Oral Daily   hydrOXYzine   25 mg Oral BID   ipratropium  0.5 mg Nebulization TID   levalbuterol   0.63 mg Nebulization TID   levothyroxine   50 mcg Oral QAC breakfast   magnesium  oxide  400 mg Oral Daily   metoprolol  tartrate  2.5 mg Intravenous Q6H   montelukast   10 mg Oral Daily   mupirocin  ointment  1 Application Nasal BID   pantoprazole   40 mg Oral Daily   rosuvastatin   20 mg Oral Daily   sodium chloride  flush  10-40 mL Intracatheter Q12H   sodium chloride  flush  10-40 mL Intracatheter Q12H   Continuous Infusions:  piperacillin -tazobactam (ZOSYN )  IV 3.375 g (07/31/23 0301)   potassium chloride      vancomycin       Procedures/Studies: DG CHEST PORT 1 VIEW Result Date: 07/30/2023 CLINICAL DATA:  Altered mental status. Shortness of breath. Lethargy. EXAM: PORTABLE CHEST 1 VIEW COMPARISON:  Chest radiograph dated 07/25/2023. FINDINGS: Right-sided PICC in similar position. Left lung base atelectasis. Right upper lobe streaky densities may represent infiltrate. Small bilateral pleural effusions suspected. No pneumothorax. Stable cardiac silhouette. Atherosclerotic calcification of the aorta. No acute osseous pathology. IMPRESSION: 1. Left lung base atelectasis. 2. Right upper lobe infiltrate. 3. Small bilateral pleural effusions. Electronically Signed   By: Vanetta Chou M.D.   On: 07/30/2023 14:15   ECHOCARDIOGRAM COMPLETE Result Date: 07/29/2023    ECHOCARDIOGRAM REPORT   Patient Name:   [name] Date of Exam: 07/29/2023 Medical Rec #:  [id number]      Height:       66.0 in Accession #:    [id number]     Weight:       199.1 lb Date of Birth:  06-13-1941       BSA:          1.996 m Patient Age:    82 years        BP:  147/104 mmHg Patient Gender: F              HR:           120 bpm. Exam Location:  Inpatient Procedure: 2D Echo, Cardiac Doppler and Color Doppler Indications:    A-fib  History:        Patient has prior history of Echocardiogram examinations, most                 recent 12/24/2017. CKD, Arrythmias:Atrial Fibrillation; Risk                 Factors:Hypertension, Diabetes, Dyslipidemia and Former Smoker.  Sonographer:    Tillman Nora RVT Referring Phys: [id number] JAN A MANSY  Sonographer Comments: Technically challenging study due to limited acoustic windows and Technically difficult study due to poor echo windows. Technically difficult due to elevated HR, patient supine, respiratory motion and patients inability to tolerate exam. HR between 120 bpm-140 bpm IMPRESSIONS  1. Left ventricular ejection fraction, by estimation, is 55 to 60%. The left ventricle has normal function. Left ventricular endocardial border not optimally defined to evaluate regional wall motion. There is mild left ventricular hypertrophy. Left ventricular diastolic parameters are indeterminate.  2. RV not well visualized, grossly appears normal in size and function. . Right ventricular systolic function was not well visualized. The right ventricular size is not well visualized. Tricuspid regurgitation signal is inadequate for assessing PA pressure.  3. The mitral valve was not well visualized. Trivial mitral valve regurgitation. No evidence of mitral stenosis.  4. The aortic valve was not well visualized. There is mild calcification of the aortic valve. There is mild thickening of the aortic valve. Aortic valve regurgitation is not visualized. No aortic stenosis is present.  5. The inferior vena cava is normal in size with greater than 50% respiratory variability, suggesting right atrial pressure of 3 mmHg.  6. Technically difficult study in setting of limited visualization and elevated heart rates. FINDINGS  Left Ventricle: Left  ventricular ejection fraction, by estimation, is 55 to 60%. The left ventricle has normal function. Left ventricular endocardial border not optimally defined to evaluate regional wall motion. The left ventricular internal cavity size was normal in size. There is mild left ventricular hypertrophy. Left ventricular diastolic parameters are indeterminate. Right Ventricle: RV not well visualized, grossly appears normal in size and function. The right ventricular size is not well visualized. Right vetricular wall thickness was not well visualized. Right ventricular systolic function was not well visualized.  Tricuspid regurgitation signal is inadequate for assessing PA pressure. Left Atrium: Left atrial size was normal in size. Right Atrium: Right atrial size was normal in size. Pericardium: There is no evidence of pericardial effusion. Mitral Valve: The mitral valve was not well visualized. Trivial mitral valve regurgitation. No evidence of mitral valve stenosis. Tricuspid Valve: The tricuspid valve is not well visualized. Tricuspid valve regurgitation is trivial. No evidence of tricuspid stenosis. Aortic Valve: The aortic valve was not well visualized. There is mild calcification of the aortic valve. There is mild thickening of the aortic valve. There is mild aortic valve annular calcification. Aortic valve regurgitation is not visualized. No aortic stenosis is present. Aortic valve mean gradient measures 2.8 mmHg. Aortic valve peak gradient measures 5.3 mmHg. Aortic valve area, by VTI measures 2.17 cm. Pulmonic Valve: The pulmonic valve was not well visualized. Pulmonic valve regurgitation is not visualized. No evidence of pulmonic stenosis. Aorta: The aortic root and ascending aorta are structurally normal, with no  evidence of dilitation. Venous: The inferior vena cava is normal in size with greater than 50% respiratory variability, suggesting right atrial pressure of 3 mmHg. IAS/Shunts: No atrial level shunt  detected by color flow Doppler.  LEFT VENTRICLE PLAX 2D LVIDd:         4.10 cm LVIDs:         2.80 cm LV PW:         1.20 cm LV IVS:        1.20 cm LVOT diam:     2.00 cm LV SV:         32 LV SV Index:   16 LVOT Area:     3.14 cm  IVC IVC diam: 1.80 cm LEFT ATRIUM           Index LA diam:      3.70 cm 1.85 cm/m LA Vol (A4C): 34.9 ml 17.49 ml/m  AORTIC VALVE                    PULMONIC VALVE AV Area (Vmax):    2.30 cm     PV Vmax:       0.88 m/s AV Area (Vmean):   2.18 cm     PV Peak grad:  3.1 mmHg AV Area (VTI):     2.17 cm AV Vmax:           114.60 cm/s AV Vmean:          79.140 cm/s AV VTI:            0.148 m AV Peak Grad:      5.3 mmHg AV Mean Grad:      2.8 mmHg LVOT Vmax:         83.87 cm/s LVOT Vmean:        54.867 cm/s LVOT VTI:          0.102 m LVOT/AV VTI ratio: 0.69  AORTA Ao Root diam: 2.20 cm Ao Asc diam:  3.40 cm  SHUNTS Systemic VTI:  0.10 m Systemic Diam: 2.00 cm Dorn Ross MD Electronically signed by Dorn Ross MD Signature Date/Time: 07/29/2023/12:24:49 PM    Final    DG Chest Portable 1 View Result Date: 07/25/2023 CLINICAL DATA:  PICC line placement EXAM: PORTABLE CHEST 1 VIEW COMPARISON:  Chest radiograph and CT chest, abdomen, and pelvis dated 07/24/2023. FINDINGS: Right PICC line tip overlies the lower SVC. Low lung volumes. Stable cardiomegaly. Aortic atherosclerosis. Similar indistinctness of the left costophrenic angle, likely correlates with prominent subpleural fat noted on the prior CT. No focal consolidation, sizeable pleural effusion, or pneumothorax. The visualized osseous structures are unchanged. IMPRESSION: 1. Right PICC line tip overlies the lower SVC. 2. Low lung volumes.  No acute cardiopulmonary findings. Electronically Signed   By: Harrietta Sherry M.D.   On: 07/25/2023 13:11   US  EKG SITE RITE Result Date: 07/25/2023 If Site Rite image not attached, placement could not be confirmed due to current cardiac rhythm.  CT CHEST ABDOMEN PELVIS W  CONTRAST Result Date: 07/24/2023 CLINICAL DATA:  Sepsis. Urinary tract infection. Previous appendectomy, hysterectomy, oophorectomy and cholecystectomy. EXAM: CT CHEST, ABDOMEN, AND PELVIS WITH CONTRAST TECHNIQUE: Multidetector CT imaging of the chest, abdomen and pelvis was performed following the standard protocol during bolus administration of intravenous contrast. RADIATION DOSE REDUCTION: This exam was performed according to the departmental dose-optimization program which includes automated exposure control, adjustment of the mA and/or kV according to patient size and/or use of iterative reconstruction technique. CONTRAST:  OMNIPAQUE  IOHEXOL  300 MG/ML  SOLN COMPARISON:  Abdomen and pelvis CT dated 04/24/2018. Portable chest obtained earlier today. Chest CT report dated 05/15/2019. Chest CTA dated 10/18/2008. FINDINGS: CT CHEST FINDINGS Cardiovascular: Stable mildly enlarged heart aortic valve calcifications and dense mitral valve annulus calcifications. Atheromatous calcifications, including the coronary arteries and aorta. The central pulmonary arteries remain mildly dilated with a main pulmonary artery diameter of 3.4 cm. Mediastinum/Nodes: No enlarged mediastinal, hilar, or axillary lymph nodes. Interval flattening of the trachea and mainstem bronchi with an interval decreased depth of inspiration. Unremarkable thyroid  gland and esophagus. Lungs/Pleura: Decreased depth of inspiration. Mild bilateral lower lobe cylindrical bronchiectasis and associated mild parenchymal densities. Prominent subpleural fat on the left causing the blunting of the lateral costophrenic angle seen radiographically. No pleural fluid Musculoskeletal: Thoracic spine degenerative changes multiple thoracic vertebral compression deformities with mild bony retropulsion and no visible acute fracture lines. Associated acute kyphosis in the upper thoracic spine. CT ABDOMEN PELVIS FINDINGS Hepatobiliary: No focal liver abnormality is  seen. Status post cholecystectomy. No biliary dilatation. Pancreas: Unremarkable. No pancreatic ductal dilatation or surrounding inflammatory changes. Spleen: Normal in size without focal abnormality. Adrenals/Urinary Tract: Normal-appearing adrenal glands multiple bilateral simple appearing renal cysts. These do not need imaging follow-up unremarkable urinary bladder and ureters Stomach/Bowel: Large number of sigmoid colon diverticula without evidence of diverticulitis. Breathing motion blurring obscuring the detail in the right lower quadrant of the abdomen. There is some fluid in gas in that region of the abdomen that is most likely intraluminal. However, due to the motion artifact, it is difficult to exclude a fluid or gas collection in that area. Surgically absent appendix by history. Unremarkable stomach. Vascular/Lymphatic: Atheromatous arterial calcifications without aneurysm. No enlarged lymph nodes. Reproductive: Status post hysterectomy. No adnexal masses. Other: Anterior subcutaneous edema and anterior subcutaneous air, compatible with recent subcutaneous injection. Moderate-sized left inguinal hernia containing fat and small to moderate-sized right inguinal hernia containing fat Musculoskeletal: Lumbar spine degenerative changes. Multiple lumbar spine vertebral compression deformities that are new since 04/24/2018. Stable old T12 vertebral compression deformity. Possible acute fracture line in the anterior, superior corner of the L2 vertebral body and possible acute fracture line in the superior endplate of the L4 vertebral body. IMPRESSION: 1. Fluid and gas in the right lower quadrant of the abdomen which is most likely within the bowel. However, this can not be confirmed as intraluminal due to motion artifacts in that area. Correlation with the presence or absence of focal pain and tenderness in that area is recommended to help exclude the possibility of an abscess or contained perforation in the  right lower quadrant of the abdomen. 2. Otherwise, no acute abnormality seen. 3. Decreased depth of inspiration with flattening of the trachea and mainstem bronchi, compatible with tracheobronchomalacia. 4. Mild bilateral lower lobe cylindrical bronchiectasis and associated mild parenchymal densities, most likely due to atelectasis. 5. Stable mild cardiomegaly. 6. Mildly dilated central pulmonary arteries, suggesting pulmonary arterial hypertension. 7. Multiple thoracic and lumbar spine vertebral compression deformities, some of which are new since 04/24/2018. Possible acute fracture lines in the anterior, superior corner of the L2 vertebral body and superior endplate of the L4 vertebral body. 8. Sigmoid colon diverticulosis. 9. Moderate-sized left inguinal hernia containing fat and small to moderate-sized right inguinal hernia containing fat. 10. Aortic atherosclerosis. Aortic Atherosclerosis (ICD10-I70.0). Electronically Signed   By: Elspeth Bathe M.D.   On: 07/24/2023 16:45   CT Head Wo Contrast Result Date: 07/24/2023 CLINICAL DATA:  Mental status change.  Current therapy for urinary tract infection. EXAM: CT HEAD WITHOUT CONTRAST TECHNIQUE: Contiguous axial images were obtained from the base of the skull through the vertex without intravenous contrast. RADIATION DOSE REDUCTION: This exam was performed according to the departmental dose-optimization program which includes automated exposure control, adjustment of the mA and/or kV according to patient size and/or use of iterative reconstruction technique. COMPARISON:  01/05/2023 FINDINGS: Brain: No evidence of acute infarction, hemorrhage, hydrocephalus, extra-axial collection or mass lesion/mass effect. Vascular: Encephalomalacia within the right occipital and posterior temporal lobes consistent with remote PCA infarct. Ex vacuo dilatation of the right posterolateral ventricle noted. There is mild diffuse low-attenuation within the subcortical and  periventricular white matter compatible with chronic microvascular disease. Skull: Normal. Negative for fracture or focal lesion. Sinuses/Orbits: Air-fluid level within the sphenoid sinus. Partial opacification of the ethmoid air cells. Other: None. IMPRESSION: 1. No acute intracranial abnormalities. 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 4. Air-fluid level within the sphenoid sinus. Correlate for any clinical signs or symptoms of acute sinusitis. Electronically Signed   By: Waddell Calk M.D.   On: 07/24/2023 16:15   DG Chest Port 1 View Result Date: 07/24/2023 CLINICAL DATA:  Questionable sepsis - evaluate for abnormality. Weakness. Altered mental status. EXAM: PORTABLE CHEST 1 VIEW COMPARISON:  01/05/2023. FINDINGS: Redemonstration of blunting of left lateral costophrenic angle, similar to the prior study, which may represent small left pleural effusion. There are probable associated atelectatic changes at the left lung base. Bilateral lung fields are otherwise clear. No acute consolidation or lung collapse. Right lateral costophrenic angle is clear. Stable cardio-mediastinal silhouette. No acute osseous abnormalities. The soft tissues are within normal limits. There are surgical clips in the right upper quadrant, typical of a previous cholecystectomy. IMPRESSION: *Persistent blunting of left lateral costophrenic angle, which may represent small left pleural effusion. *Otherwise no acute cardiopulmonary abnormality seen. Electronically Signed   By: Ree Molt M.D.   On: 07/24/2023 14:16   DG Pelvis 1-2 Views Result Date: 07/05/2023 CLINICAL DATA:  Status post fall. EXAM: PELVIS - 1-2 VIEW COMPARISON:  None Available. FINDINGS: There is no evidence of pelvic fracture or diastasis. No pelvic bone lesions are seen. Mild degenerative changes are seen involving both hips in the form of joint space narrowing and acetabular sclerosis. IMPRESSION: Mild degenerative changes involving  both hips. Electronically Signed   By: Suzen Dials M.D.   On: 07/05/2023 20:35   DG Knee Complete 4 Views Left Result Date: 07/05/2023 CLINICAL DATA:  Witnessed fall. EXAM: LEFT KNEE - COMPLETE 4+ VIEW COMPARISON:  None Available. FINDINGS: No evidence of an acute fracture, dislocation, or joint effusion. Medial marginal osteophytes are noted. There is moderate to marked severity tricompartmental joint space narrowing. Soft tissues are unremarkable. 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 Electronically Signed   By: Suzen Dials M.D.   On: 07/05/2023 20:33   DG Knee Complete 4 Views Right Result Date: 07/05/2023 CLINICAL DATA:  Status post fall. EXAM: RIGHT KNEE - COMPLETE 4+ VIEW COMPARISON:  None Available. FINDINGS: No evidence of an acute fracture, dislocation, or joint effusion. A small, chronic appearing cortical deformity versus medial marginal osteophyte is seen along the proximal right tibia. Moderate to marked severity tricompartmental joint space narrowing is seen. Soft tissues are unremarkable. 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 Electronically Signed   By: Suzen Dials M.D.   On: 07/05/2023 20:31   DG Forearm Right Result Date: 07/05/2023 CLINICAL DATA:  Status  post fall. EXAM: RIGHT FOREARM - 2 VIEW COMPARISON:  None Available. FINDINGS: There is no evidence of fracture or other focal bone lesions. Moderate to marked severity focal soft tissue swelling is seen along the dorsal aspect of the mid right forearm. IMPRESSION: Moderate to marked severity focal soft tissue swelling along the dorsal aspect of the mid right forearm. Electronically Signed   By: Suzen Dials M.D.   On: 07/05/2023 20:26    Alm Schneider, DO  [facility]  If 7PM-7AM, please contact night-coverage [email or web address] Password [email or web address] 07/31/2023, 8:46 AM   LOS: 7 days

## 2023-07-31 NOTE — Progress Notes (Signed)
   07/31/23 1652  Vitals  BP 115/86  MAP (mmHg) 70  BP Location Left Arm  Level of Consciousness  Level of Consciousness Alert  MEWS COLOR  MEWS Score Color Green  Oxygen  Therapy  SpO2 92 %  O2 Device Room Air  MEWS Score  MEWS Temp 0  MEWS Systolic 0  MEWS Pulse 0  MEWS RR 0  MEWS LOC 0  MEWS Score 0

## 2023-07-31 NOTE — Progress Notes (Signed)
 Rechecked BP , patient small BP cuff utilized her BP is now 115/86.SABRA  07/31/23 1504  Vitals  Temp 97.7 F (36.5 C)  Temp Source Oral  BP (!) 77/62  MAP (mmHg) 68  BP Location Left Wrist  Pulse Rate 80  Pulse Rate Source Dinamap  MEWS COLOR  MEWS Score Color Yellow  Oxygen  Therapy  SpO2 93 %  MEWS Score  MEWS Temp 0  MEWS Systolic 2  MEWS Pulse 0  MEWS RR 0  MEWS LOC 0  MEWS Score 2

## 2023-07-31 NOTE — Plan of Care (Signed)
   Problem: Clinical Measurements: Goal: Ability to maintain clinical measurements within normal limits will improve Outcome: Progressing   Problem: Pain Managment: Goal: General experience of comfort will improve and/or be controlled Outcome: Progressing

## 2023-07-31 NOTE — Progress Notes (Signed)
 Palliative: [name] is lying quietly in bed.  She appears acutely/chronically ill and very frail, obese.  She is resting comfortably, but wakes easily when I touch her arm.  She has extreme hearing loss, but acknowledges me when I wave.  She will make an somewhat keep eye contact.  I do not ask orientation questions.  I am not sure that she can make her basic needs known.  There is no family at bedside at this time.   I offered [name] something to drink and she is able to take a good sip of water  with a straw.  She does have some spillage from the side of her mouth.  There is no overt signs and symptoms of aspiration.  I offer [name] some more water , but she states that she has something in my mouth that I can't get out.  Face-to-face discussion with attending and bedside nursing staff related to patient condition, needs.  Conference with attending, bedside nursing staff, transition of care team related to patient condition, needs, goals of care, disposition.  Plan: At this point continue to treat the treatable but no CPR or intubation.  24 hours more for outcomes.  Anticipate return to long-term care at [facility] where she has been for 5 years, 3 years bedbound.  25 minutes  Lunette Tapp, NP Palliative medicine team Team phone [phone]

## 2023-08-01 DIAGNOSIS — I4819 Other persistent atrial fibrillation: Secondary | ICD-10-CM | POA: Diagnosis not present

## 2023-08-01 DIAGNOSIS — I4891 Unspecified atrial fibrillation: Secondary | ICD-10-CM | POA: Diagnosis not present

## 2023-08-01 DIAGNOSIS — G9340 Encephalopathy, unspecified: Secondary | ICD-10-CM | POA: Diagnosis not present

## 2023-08-01 LAB — BASIC METABOLIC PANEL
Anion gap: 8 (ref 5–15)
BUN: 7 mg/dL — ABNORMAL LOW (ref 8–23)
CO2: 32 mmol/L (ref 22–32)
Calcium: 8.3 mg/dL — ABNORMAL LOW (ref 8.9–10.3)
Chloride: 103 mmol/L (ref 98–111)
Creatinine, Ser: 0.61 mg/dL (ref 0.44–1.00)
GFR, Estimated: >60 mL/min (ref >60)
Glucose, Bld: 66 mg/dL — ABNORMAL LOW (ref 70–99)
Potassium: 3.5 mmol/L (ref 3.5–5.1)
Sodium: 143 mmol/L (ref 135–145)

## 2023-08-01 LAB — GLUCOSE, CAPILLARY: Glucose-Capillary: 106 mg/dL — ABNORMAL HIGH (ref 70–99)

## 2023-08-01 MED ORDER — HYDRALAZINE HCL 20 MG/ML IJ SOLN: 5.0000 mg | INTRAMUSCULAR | Status: DC | PRN

## 2023-08-01 MED ORDER — SODIUM CHLORIDE 0.9 % IV SOLN
INTRAVENOUS | Status: AC
Start: 2023-08-01 — End: 2023-08-02

## 2023-08-01 MED ORDER — PANTOPRAZOLE SODIUM 40 MG IV SOLR
40.0000 mg | INTRAVENOUS | Status: DC
  Administered 2023-08-01 – 2023-08-05 (×5): 40 mg via INTRAVENOUS
  Filled 2023-08-01 (×5): qty 10

## 2023-08-01 MED ORDER — ENSURE ENLIVE PO LIQD
237.0000 mL | Freq: Two times a day (BID) | ORAL | Status: DC
  Administered 2023-08-02 – 2023-08-07 (×6): 237 mL via ORAL

## 2023-08-01 NOTE — Progress Notes (Signed)
 Speech Language Pathology Treatment: Dysphagia  Patient Details Name: [name] MRN: [id number] DOB: 08-Mar-1941 Today's Date: 08/01/2023 Time: 0700-0720 SLP Time Calculation (min) (ACUTE ONLY): 20 min  Assessment / Plan / Recommendation Clinical Impression  Ongoing diagnostic dysphagia therapy provided today. Pt was alert and verbally responsive this morning. Pt with very congested cough at baseline prior to PO trials. Pt accepted limited tsp trials of ice and water  and cup sips without incident; when presented thin liquids via straw note immediate coughing. Recommend NO STRAWS. Pt only accepted one single bite of puree and stated I don't want any more. Recommend continue with current diet; meds to be crushed in puree and NO STRAWS for thin liquids. ST will continue to follow acutely.    HPI HPI: [name] is a 83 y.o. female who presented to the hospital with altered mentation and lethargy.  She was recently being treated for UTI with antibiotics.  Her past medical history significant for A-fib normally on Xarelto , diabetes, hypertension, PE, hypothyroidism, and advanced dementia and bedbound at baseline.  Her surgical history is significant for an appendectomy, hysterectomy, and cholecystectomy.  Patient is confused and not answering questions appropriately on exam, so history was obtained through EMR.  She has been intermittently tachycardic and tachypenic since admission.  She was noted to be flu positive.  A CT of the chest, abdomen, and pelvis was obtained in the emergency department.  This demonstrated a fluid and gas area in the right lower quadrant which is most likely within the bowel lumen but cannot be confirmed secondary to motion artifact.  She has no leukocytosis and a normal lactic acid.      SLP Plan  Continue with current plan of care      Recommendations for follow up therapy are one component of a multi-disciplinary discharge planning process, led by the attending  physician.  Recommendations may be updated based on patient status, additional functional criteria and insurance authorization.    Recommendations  Diet recommendations: Dysphagia 1 (puree);Thin liquid Liquids provided via: Cup;Teaspoon;No straw Medication Administration: Crushed with puree Supervision: Full supervision/cueing for compensatory strategies Compensations: Slow rate;Small sips/bites Postural Changes and/or Swallow Maneuvers: Seated upright 90 degrees                  Oral care BID           Continue with current plan of care    [name], CCC-SLP Speech Language Pathologist  [name]  08/01/2023, 7:20 AM

## 2023-08-01 NOTE — Plan of Care (Signed)
  Problem: Clinical Measurements: Goal: Ability to maintain clinical measurements within normal limits will improve Outcome: Not Progressing Goal: Diagnostic test results will improve Outcome: Not Progressing   Problem: Nutrition: Goal: Adequate nutrition will be maintained Outcome: Not Progressing

## 2023-08-01 NOTE — Progress Notes (Signed)
 PROGRESS NOTE  [name] FMW:[id number] DOB: 12/22/1940 DOA: 07/24/2023 PCP: Alphonsa Elsie RAMAN, MD (Inactive)  Brief History:  83 y.o. female, with a history of paroxysmal atrial fibrillation, diabetes mellitus type 2, hypertension, dysphagia, pulmonary embolus, hypothyroidism, and esophageal dysmotility, dementia, living at SNF for last 5 years, patient with advanced dementia at baseline, bedbound as discussed with family. -Patient was brought by ED secondary to altered mental status, increased lethargy, patient was noted to be altered since yesterday, apparently she is on antibiotics for UTI, as discussed with family, she is with advanced dementia, but usually communicative, remember some family members, bedbound at baseline.   -In ED she was altered, CT head with no acute findings, significant for chronic infarcts, CT abdomen pelvics with questionable right lower quadrant gas-fluid collection, likely within the bowels, but due to emotional artifact could not rule out abscess or perforated fluid collection, her respiratory panel significant for positive influenza A, [facility] hospitalist consulted to admit   Assessment/Plan: Acute metabolic encephalopathy -due to infectious process, including influenza -remains confused and agitated -CT head with no acute findings -Per reports patient was treated for UTI at facility prior to arrival  -palliative medicine consultation due to failure to thrive in setting of dementia -B12 1363 -folate 9.1 -TSH 2.332 -ammonia 14 -2/6 MR brain-no acute finding -VBG--7.42/55/32/36   Influenza infection -continue Tamiflu --completed course -1/30 CT chest--Mild bilateral lower lobe cylindrical bronchiectasis and associated mild parenchymal densities. -check PCT <0.10   Aspiration pneumonitis -personally reviewed CXR 2/5--RUL opacity, bibasilar atelectasis -started zosyn  -MRSA+   Persistent A-fib with RVR - resolved  -On apixaban  for  anticoagulation -currently off Cardizem  drip and back on oral cardizem   -recurrence of Afib RVR overnight 2/4, started on IV amio by night provider, cardiology consulted and now back on oral diltiazem  and off IV amiodarone    Questionable right lower quadrant fluid collection, likely just just fluid-gas most likely within the bowel -CT abdomen showing fluid and gas in the right lower quadrant of the abdomen, possibly within bowel, been difficult to confirm due to motion artifact , mentation to exclude possibility of abscess or contained perforation -Consulted general surgery - see consult notes  -advanced to clears diet per surgeon>>now tolerating regular diet -ok to resume apixaban  per surgeon -surgery signing off 2/2. Ok for diet; advanced to dys 1 on 2/2    History of PE -Resumed apixaban     Major neurocognitive disorder Deconditioning -Bedbound at baseline -Continue with supportive care -palliative consultation for goals of care given no meaningful improvement from maximal medical care   Hyperlipidemia -continue with statin   Hypothyroidism -Continue with Synthroid    Hypertension -Continue with Cardizem     Right forearm fluid collection likely hematoma -Is not warm or tender, likely related to hematoma from recent fall, ED physician attempted to aspirate only small amount of blood came out   Hypokalemia -replete -mag 2.0   Goals of care -2/5--discussed with brother -2/5 brother confirms DNR, continue treat any reversible or treatable conditions; if no improvement in next 48 hours, would consider transition to comfort measures             Family Communication:   brother updated 2/6   Consultants:  palliative   Code Status: DNR   DVT Prophylaxis:  apixaban      Procedures: As Listed in Progress Note Above   Antibiotics: Zosyn  2/5>> Vanc 2/6>>         Subjective: Pt is awake  but remains confused with incomprehensible speech.  No vomiting.  No resp  distress  Objective: Vitals:   08/01/23 0014 08/01/23 0423 08/01/23 0822 08/01/23 1357  BP: (!) 127/98 118/87  (!) 142/94  Pulse: 76 64  80  Resp: 14 18  18   Temp:  97.7 F (36.5 C)  98.5 F (36.9 C)  TempSrc:    Axillary  SpO2: 100% 99% 97% 100%  Weight:      Height:        Intake/Output Summary (Last 24 hours) at 08/01/2023 1739 Last data filed at 08/01/2023 1401 Gross per 24 hour  Intake 768.32 ml  Output 100 ml  Net 668.32 ml   Weight change:  Exam:  General:  Pt is alert, does not follow commands appropriately, not in acute distress HEENT: No icterus, No thrush, No neck mass, [place]/AT Cardiovascular: RRR, S1/S2, no rubs, no gallops Respiratory: bilateral rales.  Upper airway wheeze Abdomen: Soft/+BS, non tender, non distended, no guarding Extremities: No edema, No lymphangitis, No petechiae, No rashes, no synovitis   Data Reviewed: I have personally reviewed following labs and imaging studies Basic Metabolic Panel: Recent Labs  Lab 07/26/23 0753 07/27/23 0834 07/29/23 0444 07/30/23 1008 07/31/23 0955 08/01/23 0505  NA 138 141  --  143 143 143  K 3.5 3.6  --  3.3* 3.0* 3.5  CL 105 105  --  102 103 103  CO2 26 30  --  29 31 32  GLUCOSE 80 82  --  80 70 66*  BUN 9 6*  --  5* 7* 7*  CREATININE 0.44 0.47  --  0.47 0.62 0.61  CALCIUM  8.2* 8.5*  --  8.3* 8.4* 8.3*  MG  --   --  1.7 2.0 1.8  --    Liver Function Tests: Recent Labs  Lab 07/30/23 1008  AST 17  ALT 10  ALKPHOS 67  BILITOT 0.9  PROT 6.0*  ALBUMIN  2.7*   No results for input(s): LIPASE, AMYLASE in the last 168 hours. Recent Labs  Lab 07/30/23 1008  AMMONIA 14   Coagulation Profile: No results for input(s): INR, PROTIME in the last 168 hours. CBC: Recent Labs  Lab 07/26/23 0753 07/27/23 0834 07/30/23 1008 07/31/23 0955  WBC 4.4 3.2* 10.4 7.3  HGB 9.1* 10.9* 11.7* 11.0*  HCT 30.9* 37.1 39.3 37.1  MCV 90.6 90.5 88.3 88.1  PLT 165 177 216 250   Cardiac Enzymes: No results  for input(s): CKTOTAL, CKMB, CKMBINDEX, TROPONINI in the last 168 hours. BNP: Invalid input(s): POCBNP CBG: Recent Labs  Lab 07/26/23 0737  GLUCAP 81   HbA1C: No results for input(s): HGBA1C in the last 72 hours. Urine analysis:    Component Value Date/Time   COLORURINE YELLOW 07/05/2023 1934   APPEARANCEUR CLOUDY (A) 07/05/2023 1934   LABSPEC 1.018 07/05/2023 1934   PHURINE 6.0 07/05/2023 1934   GLUCOSEU NEGATIVE 07/05/2023 1934   HGBUR NEGATIVE 07/05/2023 1934   BILIRUBINUR NEGATIVE 07/05/2023 1934   KETONESUR 5 (A) 07/05/2023 1934   PROTEINUR NEGATIVE 07/05/2023 1934   UROBILINOGEN 0.2 05/08/2014 2240   NITRITE NEGATIVE 07/05/2023 1934   LEUKOCYTESUR LARGE (A) 07/05/2023 1934   Sepsis Labs: @LABRCNTIP (procalcitonin:4,lacticidven:4) ) Recent Results (from the past 240 hours)  Blood Culture (routine x 2)     Status: None   Collection Time: 07/24/23  1:23 PM   Specimen: BLOOD  Result Value Ref Range Status   Specimen Description BLOOD LEFT ANTECUBITAL  Final   Special Requests  Final    BOTTLES DRAWN AEROBIC ONLY Blood Culture adequate volume   Culture   Final    NO GROWTH 5 DAYS Performed at [facility], [address], [place], [place] [address]    Report Status 07/29/2023 FINAL  Final  Blood Culture (routine x 2)     Status: None   Collection Time: 07/24/23  1:29 PM   Specimen: BLOOD  Result Value Ref Range Status   Specimen Description BLOOD BLOOD LEFT FOREARM  Final   Special Requests   Final    BOTTLES DRAWN AEROBIC AND ANAEROBIC Blood Culture results may not be optimal due to an inadequate volume of blood received in culture bottles   Culture   Final    NO GROWTH 5 DAYS Performed at [facility], [address], [place], [place] [address]    Report Status 07/29/2023 FINAL  Final  Resp panel by RT-PCR (RSV, Flu A&B, Covid) Anterior Nasal Swab     Status: Abnormal   Collection Time: 07/24/23  2:42 PM   Specimen: Anterior Nasal Swab  Result  Value Ref Range Status   SARS Coronavirus 2 by RT PCR NEGATIVE NEGATIVE Final    Comment: (NOTE) SARS-CoV-2 target nucleic acids are NOT DETECTED.  The SARS-CoV-2 RNA is generally detectable in upper respiratory specimens during the acute phase of infection. The lowest concentration of SARS-CoV-2 viral copies this assay can detect is 138 copies/mL. A negative result does not preclude SARS-Cov-2 infection and should not be used as the sole basis for treatment or other patient management decisions. A negative result may occur with  improper specimen collection/handling, submission of specimen other than nasopharyngeal swab, presence of viral mutation(s) within the areas targeted by this assay, and inadequate number of viral copies(<138 copies/mL). A negative result must be combined with clinical observations, patient history, and epidemiological information. The expected result is Negative.  Fact Sheet for Patients:  bloggercourse.com  Fact Sheet for Healthcare Providers:  seriousbroker.it  This test is no t yet approved or cleared by the [place]  FDA and  has been authorized for detection and/or diagnosis of SARS-CoV-2 by FDA under an Emergency Use Authorization (EUA). This EUA will remain  in effect (meaning this test can be used) for the duration of the COVID-19 declaration under Section 564(b)(1) of the Act, 21 U.S.C.section 360bbb-3(b)(1), unless the authorization is terminated  or revoked sooner.       Influenza A by PCR POSITIVE (A) NEGATIVE Final   Influenza B by PCR NEGATIVE NEGATIVE Final    Comment: (NOTE) The Xpert Xpress SARS-CoV-2/FLU/RSV plus assay is intended as an aid in the diagnosis of influenza from Nasopharyngeal swab specimens and should not be used as a sole basis for treatment. Nasal washings and aspirates are unacceptable for Xpert Xpress SARS-CoV-2/FLU/RSV testing.  Fact Sheet for  Patients: bloggercourse.com  Fact Sheet for Healthcare Providers: seriousbroker.it  This test is not yet approved or cleared by the [place]  FDA and has been authorized for detection and/or diagnosis of SARS-CoV-2 by FDA under an Emergency Use Authorization (EUA). This EUA will remain in effect (meaning this test can be used) for the duration of the COVID-19 declaration under Section 564(b)(1) of the Act, 21 U.S.C. section 360bbb-3(b)(1), unless the authorization is terminated or revoked.     Resp Syncytial Virus by PCR NEGATIVE NEGATIVE Final    Comment: (NOTE) Fact Sheet for Patients: bloggercourse.com  Fact Sheet for Healthcare Providers: seriousbroker.it  This test is not yet approved or cleared by  the [place]  FDA and has been authorized for detection and/or diagnosis of SARS-CoV-2 by FDA under an Emergency Use Authorization (EUA). This EUA will remain in effect (meaning this test can be used) for the duration of the COVID-19 declaration under Section 564(b)(1) of the Act, 21 U.S.C. section 360bbb-3(b)(1), unless the authorization is terminated or revoked.  Performed at [facility], [address] [place], [place] [address]   MRSA Next Gen by PCR, Nasal     Status: Abnormal   Collection Time: 07/25/23  1:20 PM   Specimen: Nasal Mucosa; Nasal Swab  Result Value Ref Range Status   MRSA by PCR Next Gen DETECTED (A) NOT DETECTED Final    Comment: RESULT CALLED TO, READ BACK BY AND VERIFIED WITH: BREANNA,RN ON 07/25/23 AT 1913 BY PURDIE,J        The GeneXpert MRSA Assay (FDA approved for NASAL specimens only), is one component of a comprehensive MRSA colonization surveillance program. It is not intended to diagnose MRSA infection nor to guide or monitor treatment for MRSA infections. Performed at [facility], [address], [place], [place] [address]       Scheduled Meds:  apixaban   5 mg Oral BID   bisacodyl   10 mg Rectal Daily   budesonide  (PULMICORT ) nebulizer solution  0.25 mg Nebulization BID   diltiazem   60 mg Oral TID   ipratropium  0.5 mg Nebulization TID   levalbuterol   0.63 mg Nebulization TID   levothyroxine   50 mcg Oral QAC breakfast   metoprolol  tartrate  2.5 mg Intravenous Q6H   pantoprazole  (PROTONIX ) IV  40 mg Intravenous Q24H   sodium chloride  flush  10-40 mL Intracatheter Q12H   sodium chloride  flush  10-40 mL Intracatheter Q12H   Continuous Infusions:  piperacillin -tazobactam (ZOSYN )  IV 3.375 g (08/01/23 1010)   vancomycin  200 mL/hr at 07/31/23 2020    Procedures/Studies: MR BRAIN WO CONTRAST Result Date: 07/31/2023 CLINICAL DATA:  Mental status change, unknown cause. EXAM: MRI HEAD WITHOUT CONTRAST TECHNIQUE: Multiplanar, multiecho pulse sequences of the brain and surrounding structures were obtained without intravenous contrast. COMPARISON:  Head CT July 24, 2023. FINDINGS: The study is significantly degraded by motion. Brain: No acute infarction, hemorrhage, hydrocephalus, extra-axial collection or mass lesion. Remote right PCA territory infarct. At least mild to moderate chronic ischemic changes of the white matter. Moderate parenchymal volume loss. Vascular: Normal flow voids. Skull and upper cervical spine: No focal lesion identified. Sinuses/Orbits: Mucosal thickening of the paranasal sinuses with fluid level within the sphenoid sinuses. Other: None. IMPRESSION: 1. Significantly motion degraded study. 2. No acute intracranial abnormality identified. 3. Remote right PCA territory infarct. 4. At least mild to moderate chronic ischemic changes of the white matter. 5. Moderate parenchymal volume loss. 6. Paranasal sinus disease. Electronically Signed   By: Katyucia  de Macedo Rodrigues M.D.   On: 07/31/2023 12:31   DG CHEST PORT 1 VIEW Result Date: 07/30/2023 CLINICAL DATA:  Altered mental status. Shortness of breath.  Lethargy. EXAM: PORTABLE CHEST 1 VIEW COMPARISON:  Chest radiograph dated 07/25/2023. FINDINGS: Right-sided PICC in similar position. Left lung base atelectasis. Right upper lobe streaky densities may represent infiltrate. Small bilateral pleural effusions suspected. No pneumothorax. Stable cardiac silhouette. Atherosclerotic calcification of the aorta. No acute osseous pathology. IMPRESSION: 1. Left lung base atelectasis. 2. Right upper lobe infiltrate. 3. Small bilateral pleural effusions. Electronically Signed   By: Vanetta Chou M.D.   On: 07/30/2023 14:15   ECHOCARDIOGRAM COMPLETE Result Date: 07/29/2023  ECHOCARDIOGRAM REPORT   Patient Name:   JAVAEH MUSCATELLO Date of Exam: 07/29/2023 Medical Rec #:  [id number]      Height:       66.0 in Accession #:    [id number]     Weight:       199.1 lb Date of Birth:  [birth year]       BSA:          1.996 m Patient Age:    82 years       BP:           147/104 mmHg Patient Gender: F              HR:           120 bpm. Exam Location:  Inpatient Procedure: 2D Echo, Cardiac Doppler and Color Doppler Indications:    A-fib  History:        Patient has prior history of Echocardiogram examinations, most                 recent 12/24/2017. CKD, Arrythmias:Atrial Fibrillation; Risk                 Factors:Hypertension, Diabetes, Dyslipidemia and Former Smoker.  Sonographer:    Tillman Nora RVT Referring Phys: [id number] JAN A MANSY  Sonographer Comments: Technically challenging study due to limited acoustic windows and Technically difficult study due to poor echo windows. Technically difficult due to elevated HR, patient supine, respiratory motion and patients inability to tolerate exam. HR between 120 bpm-140 bpm IMPRESSIONS  1. Left ventricular ejection fraction, by estimation, is 55 to 60%. The left ventricle has normal function. Left ventricular endocardial border not optimally defined to evaluate regional wall motion. There is mild left ventricular hypertrophy. Left ventricular  diastolic parameters are indeterminate.  2. RV not well visualized, grossly appears normal in size and function. . Right ventricular systolic function was not well visualized. The right ventricular size is not well visualized. Tricuspid regurgitation signal is inadequate for assessing PA pressure.  3. The mitral valve was not well visualized. Trivial mitral valve regurgitation. No evidence of mitral stenosis.  4. The aortic valve was not well visualized. There is mild calcification of the aortic valve. There is mild thickening of the aortic valve. Aortic valve regurgitation is not visualized. No aortic stenosis is present.  5. The inferior vena cava is normal in size with greater than 50% respiratory variability, suggesting right atrial pressure of 3 mmHg.  6. Technically difficult study in setting of limited visualization and elevated heart rates. FINDINGS  Left Ventricle: Left ventricular ejection fraction, by estimation, is 55 to 60%. The left ventricle has normal function. Left ventricular endocardial border not optimally defined to evaluate regional wall motion. The left ventricular internal cavity size was normal in size. There is mild left ventricular hypertrophy. Left ventricular diastolic parameters are indeterminate. Right Ventricle: RV not well visualized, grossly appears normal in size and function. The right ventricular size is not well visualized. Right vetricular wall thickness was not well visualized. Right ventricular systolic function was not well visualized.  Tricuspid regurgitation signal is inadequate for assessing PA pressure. Left Atrium: Left atrial size was normal in size. Right Atrium: Right atrial size was normal in size. Pericardium: There is no evidence of pericardial effusion. Mitral Valve: The mitral valve was not well visualized. Trivial mitral valve regurgitation. No evidence of mitral valve stenosis. Tricuspid Valve: The tricuspid valve is not well visualized. Tricuspid valve  regurgitation is trivial. No  evidence of tricuspid stenosis. Aortic Valve: The aortic valve was not well visualized. There is mild calcification of the aortic valve. There is mild thickening of the aortic valve. There is mild aortic valve annular calcification. Aortic valve regurgitation is not visualized. No aortic stenosis is present. Aortic valve mean gradient measures 2.8 mmHg. Aortic valve peak gradient measures 5.3 mmHg. Aortic valve area, by VTI measures 2.17 cm. Pulmonic Valve: The pulmonic valve was not well visualized. Pulmonic valve regurgitation is not visualized. No evidence of pulmonic stenosis. Aorta: The aortic root and ascending aorta are structurally normal, with no evidence of dilitation. Venous: The inferior vena cava is normal in size with greater than 50% respiratory variability, suggesting right atrial pressure of 3 mmHg. IAS/Shunts: No atrial level shunt detected by color flow Doppler.  LEFT VENTRICLE PLAX 2D LVIDd:         4.10 cm LVIDs:         2.80 cm LV PW:         1.20 cm LV IVS:        1.20 cm LVOT diam:     2.00 cm LV SV:         32 LV SV Index:   16 LVOT Area:     3.14 cm  IVC IVC diam: 1.80 cm LEFT ATRIUM           Index LA diam:      3.70 cm 1.85 cm/m LA Vol (A4C): 34.9 ml 17.49 ml/m  AORTIC VALVE                    PULMONIC VALVE AV Area (Vmax):    2.30 cm     PV Vmax:       0.88 m/s AV Area (Vmean):   2.18 cm     PV Peak grad:  3.1 mmHg AV Area (VTI):     2.17 cm AV Vmax:           114.60 cm/s AV Vmean:          79.140 cm/s AV VTI:            0.148 m AV Peak Grad:      5.3 mmHg AV Mean Grad:      2.8 mmHg LVOT Vmax:         83.87 cm/s LVOT Vmean:        54.867 cm/s LVOT VTI:          0.102 m LVOT/AV VTI ratio: 0.69  AORTA Ao Root diam: 2.20 cm Ao Asc diam:  3.40 cm  SHUNTS Systemic VTI:  0.10 m Systemic Diam: 2.00 cm Dorn Ross MD Electronically signed by Dorn Ross MD Signature Date/Time: 07/29/2023/12:24:49 PM    Final    DG Chest Portable 1 View Result Date:  07/25/2023 CLINICAL DATA:  PICC line placement EXAM: PORTABLE CHEST 1 VIEW COMPARISON:  Chest radiograph and CT chest, abdomen, and pelvis dated 07/24/2023. FINDINGS: Right PICC line tip overlies the lower SVC. Low lung volumes. Stable cardiomegaly. Aortic atherosclerosis. Similar indistinctness of the left costophrenic angle, likely correlates with prominent subpleural fat noted on the prior CT. No focal consolidation, sizeable pleural effusion, or pneumothorax. The visualized osseous structures are unchanged. IMPRESSION: 1. Right PICC line tip overlies the lower SVC. 2. Low lung volumes.  No acute cardiopulmonary findings. Electronically Signed   By: Harrietta Sherry M.D.   On: 07/25/2023 13:11   US  EKG SITE RITE Result Date: 07/25/2023 If Site [facility]  not attached, placement could not be confirmed due to current cardiac rhythm.  CT CHEST ABDOMEN PELVIS W CONTRAST Result Date: 07/24/2023 CLINICAL DATA:  Sepsis. Urinary tract infection. Previous appendectomy, hysterectomy, oophorectomy and cholecystectomy. EXAM: CT CHEST, ABDOMEN, AND PELVIS WITH CONTRAST TECHNIQUE: Multidetector CT imaging of the chest, abdomen and pelvis was performed following the standard protocol during bolus administration of intravenous contrast. RADIATION DOSE REDUCTION: This exam was performed according to the departmental dose-optimization program which includes automated exposure control, adjustment of the mA and/or kV according to patient size and/or use of iterative reconstruction technique. CONTRAST:  OMNIPAQUE  IOHEXOL  300 MG/ML  SOLN COMPARISON:  Abdomen and pelvis CT dated 04/24/2018. Portable chest obtained earlier today. Chest CT report dated 05/15/2019. Chest CTA dated 10/18/2008. FINDINGS: CT CHEST FINDINGS Cardiovascular: Stable mildly enlarged heart aortic valve calcifications and dense mitral valve annulus calcifications. Atheromatous calcifications, including the coronary arteries and aorta. The central  pulmonary arteries remain mildly dilated with a main pulmonary artery diameter of 3.4 cm. Mediastinum/Nodes: No enlarged mediastinal, hilar, or axillary lymph nodes. Interval flattening of the trachea and mainstem bronchi with an interval decreased depth of inspiration. Unremarkable thyroid  gland and esophagus. Lungs/Pleura: Decreased depth of inspiration. Mild bilateral lower lobe cylindrical bronchiectasis and associated mild parenchymal densities. Prominent subpleural fat on the left causing the blunting of the lateral costophrenic angle seen radiographically. No pleural fluid Musculoskeletal: Thoracic spine degenerative changes multiple thoracic vertebral compression deformities with mild bony retropulsion and no visible acute fracture lines. Associated acute kyphosis in the upper thoracic spine. CT ABDOMEN PELVIS FINDINGS Hepatobiliary: No focal liver abnormality is seen. Status post cholecystectomy. No biliary dilatation. Pancreas: Unremarkable. No pancreatic ductal dilatation or surrounding inflammatory changes. Spleen: Normal in size without focal abnormality. Adrenals/Urinary Tract: Normal-appearing adrenal glands multiple bilateral simple appearing renal cysts. These do not need imaging follow-up unremarkable urinary bladder and ureters Stomach/Bowel: Large number of sigmoid colon diverticula without evidence of diverticulitis. Breathing motion blurring obscuring the detail in the right lower quadrant of the abdomen. There is some fluid in gas in that region of the abdomen that is most likely intraluminal. However, due to the motion artifact, it is difficult to exclude a fluid or gas collection in that area. Surgically absent appendix by history. Unremarkable stomach. Vascular/Lymphatic: Atheromatous arterial calcifications without aneurysm. No enlarged lymph nodes. Reproductive: Status post hysterectomy. No adnexal masses. Other: Anterior subcutaneous edema and anterior subcutaneous air, compatible with  recent subcutaneous injection. Moderate-sized left inguinal hernia containing fat and small to moderate-sized right inguinal hernia containing fat Musculoskeletal: Lumbar spine degenerative changes. Multiple lumbar spine vertebral compression deformities that are new since 04/24/2018. Stable old T12 vertebral compression deformity. Possible acute fracture line in the anterior, superior corner of the L2 vertebral body and possible acute fracture line in the superior endplate of the L4 vertebral body. IMPRESSION: 1. Fluid and gas in the right lower quadrant of the abdomen which is most likely within the bowel. However, this can not be confirmed as intraluminal due to motion artifacts in that area. Correlation with the presence or absence of focal pain and tenderness in that area is recommended to help exclude the possibility of an abscess or contained perforation in the right lower quadrant of the abdomen. 2. Otherwise, no acute abnormality seen. 3. Decreased depth of inspiration with flattening of the trachea and mainstem bronchi, compatible with tracheobronchomalacia. 4. Mild bilateral lower lobe cylindrical bronchiectasis and associated mild parenchymal densities, most likely due to atelectasis. 5. Stable mild cardiomegaly. 6. Mildly  dilated central pulmonary arteries, suggesting pulmonary arterial hypertension. 7. Multiple thoracic and lumbar spine vertebral compression deformities, some of which are new since 04/24/2018. Possible acute fracture lines in the anterior, superior corner of the L2 vertebral body and superior endplate of the L4 vertebral body. 8. Sigmoid colon diverticulosis. 9. Moderate-sized left inguinal hernia containing fat and small to moderate-sized right inguinal hernia containing fat. 10. Aortic atherosclerosis. Aortic Atherosclerosis (ICD10-I70.0). Electronically Signed   By: Elspeth Bathe M.D.   On: 07/24/2023 16:45   CT Head Wo Contrast Result Date: 07/24/2023 CLINICAL DATA:  Mental  status change. Current therapy for urinary tract infection. EXAM: CT HEAD WITHOUT CONTRAST TECHNIQUE: Contiguous axial images were obtained from the base of the skull through the vertex without intravenous contrast. RADIATION DOSE REDUCTION: This exam was performed according to the departmental dose-optimization program which includes automated exposure control, adjustment of the mA and/or kV according to patient size and/or use of iterative reconstruction technique. COMPARISON:  01/05/2023 FINDINGS: Brain: No evidence of acute infarction, hemorrhage, hydrocephalus, extra-axial collection or mass lesion/mass effect. Vascular: Encephalomalacia within the right occipital and posterior temporal lobes consistent with remote PCA infarct. Ex vacuo dilatation of the right posterolateral ventricle noted. There is mild diffuse low-attenuation within the subcortical and periventricular white matter compatible with chronic microvascular disease. Skull: Normal. Negative for fracture or focal lesion. Sinuses/Orbits: Air-fluid level within the sphenoid sinus. Partial opacification of the ethmoid air cells. Other: None. IMPRESSION: 1. No acute intracranial abnormalities. 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 4. Air-fluid level within the sphenoid sinus. Correlate for any clinical signs or symptoms of acute sinusitis. Electronically Signed   By: Waddell Calk M.D.   On: 07/24/2023 16:15   DG Chest Port 1 View Result Date: 07/24/2023 CLINICAL DATA:  Questionable sepsis - evaluate for abnormality. Weakness. Altered mental status. EXAM: PORTABLE CHEST 1 VIEW COMPARISON:  01/05/2023. FINDINGS: Redemonstration of blunting of left lateral costophrenic angle, similar to the prior study, which may represent small left pleural effusion. There are probable associated atelectatic changes at the left lung base. Bilateral lung fields are otherwise clear. No acute consolidation or lung collapse. Right lateral  costophrenic angle is clear. Stable cardio-mediastinal silhouette. No acute osseous abnormalities. The soft tissues are within normal limits. There are surgical clips in the right upper quadrant, typical of a previous cholecystectomy. IMPRESSION: *Persistent blunting of left lateral costophrenic angle, which may represent small left pleural effusion. *Otherwise no acute cardiopulmonary abnormality seen. Electronically Signed   By: Ree Molt M.D.   On: 07/24/2023 14:16   DG Pelvis 1-2 Views Result Date: 07/05/2023 CLINICAL DATA:  Status post fall. EXAM: PELVIS - 1-2 VIEW COMPARISON:  None Available. FINDINGS: There is no evidence of pelvic fracture or diastasis. No pelvic bone lesions are seen. Mild degenerative changes are seen involving both hips in the form of joint space narrowing and acetabular sclerosis. IMPRESSION: Mild degenerative changes involving both hips. Electronically Signed   By: Suzen Dials M.D.   On: 07/05/2023 20:35   DG Knee Complete 4 Views Left Result Date: 07/05/2023 CLINICAL DATA:  Witnessed fall. EXAM: LEFT KNEE - COMPLETE 4+ VIEW COMPARISON:  None Available. FINDINGS: No evidence of an acute fracture, dislocation, or joint effusion. Medial marginal osteophytes are noted. There is moderate to marked severity tricompartmental joint space narrowing. Soft tissues are unremarkable. 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 Electronically Signed   By: Suzen Dials M.D.   On: 07/05/2023 20:33   DG  Knee Complete 4 Views Right Result Date: 07/05/2023 CLINICAL DATA:  Status post fall. EXAM: RIGHT KNEE - COMPLETE 4+ VIEW COMPARISON:  None Available. FINDINGS: No evidence of an acute fracture, dislocation, or joint effusion. A small, chronic appearing cortical deformity versus medial marginal osteophyte is seen along the proximal right tibia. Moderate to marked severity tricompartmental joint space narrowing is seen. Soft tissues are  unremarkable. 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 Electronically Signed   By: Suzen Dials M.D.   On: 07/05/2023 20:31   DG Forearm Right Result Date: 07/05/2023 CLINICAL DATA:  Status post fall. EXAM: RIGHT FOREARM - 2 VIEW COMPARISON:  None Available. FINDINGS: There is no evidence of fracture or other focal bone lesions. Moderate to marked severity focal soft tissue swelling is seen along the dorsal aspect of the mid right forearm. IMPRESSION: Moderate to marked severity focal soft tissue swelling along the dorsal aspect of the mid right forearm. Electronically Signed   By: Suzen Dials M.D.   On: 07/05/2023 20:26    Alm Schneider, DO  [facility]  If 7PM-7AM, please contact night-coverage [email or web address] Password [facility] 08/01/2023, 5:39 PM   LOS: 8 days

## 2023-08-02 DIAGNOSIS — J101 Influenza due to other identified influenza virus with other respiratory manifestations: Secondary | ICD-10-CM | POA: Diagnosis not present

## 2023-08-02 DIAGNOSIS — I4891 Unspecified atrial fibrillation: Secondary | ICD-10-CM | POA: Diagnosis not present

## 2023-08-02 DIAGNOSIS — G9340 Encephalopathy, unspecified: Secondary | ICD-10-CM | POA: Diagnosis not present

## 2023-08-02 LAB — GLUCOSE, CAPILLARY
Glucose-Capillary: 51 mg/dL — ABNORMAL LOW (ref 70–99)
Glucose-Capillary: 84 mg/dL (ref 70–99)
Glucose-Capillary: 63 mg/dL — ABNORMAL LOW (ref 70–99)
Glucose-Capillary: 88 mg/dL (ref 70–99)
Glucose-Capillary: 90 mg/dL (ref 70–99)
Glucose-Capillary: 85 mg/dL (ref 70–99)
Glucose-Capillary: 91 mg/dL (ref 70–99)

## 2023-08-02 LAB — BASIC METABOLIC PANEL
Anion gap: 9 (ref 5–15)
BUN: 10 mg/dL (ref 8–23)
CO2: 28 mmol/L (ref 22–32)
Calcium: 8.4 mg/dL — ABNORMAL LOW (ref 8.9–10.3)
Chloride: 104 mmol/L (ref 98–111)
Creatinine, Ser: 0.83 mg/dL (ref 0.44–1.00)
GFR, Estimated: >60 mL/min (ref >60)
Glucose, Bld: 82 mg/dL (ref 70–99)
Potassium: 2.8 mmol/L — ABNORMAL LOW (ref 3.5–5.1)
Sodium: 141 mmol/L (ref 135–145)

## 2023-08-02 LAB — MAGNESIUM: Magnesium: 1.6 mg/dL — ABNORMAL LOW (ref 1.7–2.4)

## 2023-08-02 MED ORDER — POTASSIUM CHLORIDE 10 MEQ/100ML IV SOLN
10.0000 meq | INTRAVENOUS | Status: AC
Start: 2023-08-02 — End: 2023-08-02
  Administered 2023-08-02 (×5): 10 meq via INTRAVENOUS
  Filled 2023-08-02 (×5): qty 100

## 2023-08-02 MED ORDER — SODIUM CHLORIDE 0.9% FLUSH: 10.0000 mL | INTRAVENOUS | Status: DC | PRN

## 2023-08-02 MED ORDER — DEXTROSE 5 % IV SOLN: INTRAVENOUS | Status: AC

## 2023-08-02 MED ORDER — MAGNESIUM SULFATE 2 GM/50ML IV SOLN
2.0000 g | Freq: Once | INTRAVENOUS | Status: AC
  Administered 2023-08-02: 2 g via INTRAVENOUS
  Filled 2023-08-02: qty 50

## 2023-08-02 MED ORDER — KETOROLAC TROMETHAMINE 15 MG/ML IJ SOLN
15.0000 mg | Freq: Once | INTRAMUSCULAR | Status: AC
  Administered 2023-08-02: 15 mg via INTRAVENOUS
  Filled 2023-08-02: qty 1

## 2023-08-02 MED ORDER — CHLORHEXIDINE GLUCONATE CLOTH 2 % EX PADS
6.0000 | MEDICATED_PAD | Freq: Every day | CUTANEOUS | Status: DC
  Administered 2023-08-02 – 2023-08-08 (×7): 6 via TOPICAL

## 2023-08-02 MED ORDER — SODIUM CHLORIDE 0.9% FLUSH
10.0000 mL | Freq: Two times a day (BID) | INTRAVENOUS | Status: DC
  Administered 2023-08-02 – 2023-08-04 (×5): 10 mL

## 2023-08-02 MED ORDER — POTASSIUM CHLORIDE CRYS ER 20 MEQ PO TBCR
20.0000 meq | EXTENDED_RELEASE_TABLET | Freq: Once | ORAL | Status: AC
  Administered 2023-08-02: 20 meq via ORAL
  Filled 2023-08-02: qty 1

## 2023-08-02 NOTE — Progress Notes (Signed)
 Patient has been refusing to eat today. Pt has barely been waking up enough to take medications. Seems as though she is very hard of hearing which could be contributing. MD Tat notified.

## 2023-08-02 NOTE — Progress Notes (Signed)
 Progress note  Patient presented with 2 episodes overnight with hypoglycemia with CBG dropping to 51 and 63.  Patient is n.p.o. IV D5W at 40 cc/h was started

## 2023-08-02 NOTE — Progress Notes (Signed)
 PROGRESS NOTE  Shaun MAKINLEE AWWAD FMW:[id number] DOB: March 30, 1941 DOA: 07/24/2023 PCP: Alphonsa Elsie RAMAN, MD (Inactive)  Brief History:  83 y.o. female, with a history of paroxysmal atrial fibrillation, diabetes mellitus type 2, hypertension, dysphagia, pulmonary embolus, hypothyroidism, and esophageal dysmotility, dementia, living at SNF for last 5 years, patient with advanced dementia at baseline, bedbound as discussed with family. -Patient was brought by ED secondary to altered mental status, increased lethargy, patient was noted to be altered since yesterday, apparently she is on antibiotics for UTI, as discussed with family, she is with advanced dementia, but usually communicative, remember some family members, bedbound at baseline.   -In ED she was altered, CT head with no acute findings, significant for chronic infarcts, CT abdomen pelvics with questionable right lower quadrant gas-fluid collection, likely within the bowels, but due to emotional artifact could not rule out abscess or perforated fluid collection, her respiratory panel significant for positive influenza A, [facility] hospitalist consulted to admit   Assessment/Plan: Acute metabolic encephalopathy -due to infectious process, including influenza -2/8 more awake but remains confused and agitated--very po po intake--essentially zero -CT head with no acute findings -Per reports patient was treated for UTI at facility prior to arrival  -palliative medicine consultation due to failure to thrive in setting of dementia -B12 1363 -folate 9.1 -TSH 2.332 -ammonia 14 -2/6 MR brain-no acute finding -VBG--7.42/55/32/36   Influenza infection -continue Tamiflu --completed course -1/30 CT chest--Mild bilateral lower lobe cylindrical bronchiectasis and associated mild parenchymal densities. -check PCT <0.10   Aspiration pneumonitis -personally reviewed CXR 2/5--RUL opacity, bibasilar atelectasis -continue  zosyn  -MRSA+  Hypoglycemia -due to poor po intake -started D5W   Persistent A-fib with RVR - resolved  -On apixaban  for anticoagulation -currently off Cardizem  drip and back on oral cardizem   -2/8 d/c po diltiazem  due to soft BP, continue IV lopressor  -recurrence of Afib RVR overnight 2/4, started on IV amio by night provider, cardiology consulted and now back on oral diltiazem  and off IV amiodarone    Questionable right lower quadrant fluid collection, likely just just fluid-gas most likely within the bowel -CT abdomen showing fluid and gas in the right lower quadrant of the abdomen, possibly within bowel, been difficult to confirm due to motion artifact , mentation to exclude possibility of abscess or contained perforation -Consulted general surgery - see consult notes  -advanced to clears diet per surgeon>>now tolerating regular diet -ok to resume apixaban  per surgeon -surgery signing off 2/2. Ok for diet; advanced to dys 1 on 2/2    History of PE -Resumed apixaban     Major neurocognitive disorder Deconditioning -Bedbound at baseline -Continue with supportive care   Hyperlipidemia -continue with statin   Hypothyroidism -Continue with Synthroid    Hypertension -Continue with Cardizem     Right forearm fluid collection likely hematoma -Is not warm or tender, likely related to hematoma from recent fall, ED physician attempted to aspirate only small amount of blood came out   Hypokalemia -replete -mag 2.0   Goals of care -2/5--discussed with brother -2/5 brother confirms DNR, continue treat any reversible or treatable conditions; if no improvement in next 48 hours, would consider transition to comfort measures             Family Communication:   brother updated 2/8   Consultants:  palliative   Code Status: DNR   DVT Prophylaxis:  apixaban      Procedures: As Listed in Progress Note Above   Antibiotics: Zosyn  2/5>>  Vanc  2/6>>         Subjective: Pt is awake.  Remains confused.  ROS limited by encephalopathy and hearing impairmewnt  Objective: Vitals:   08/02/23 0846 08/02/23 1101 08/02/23 1515 08/02/23 1517  BP: 136/76 129/60 112/83   Pulse: (!) 115 (!) 102 85   Resp:   15   Temp:   98.1 F (36.7 C)   TempSrc:   Axillary   SpO2: 94%  98% 96%  Weight:      Height:        Intake/Output Summary (Last 24 hours) at 08/02/2023 1544 Last data filed at 08/02/2023 1521 Gross per 24 hour  Intake 2609.06 ml  Output 150 ml  Net 2459.06 ml   Weight change:  Exam:  General:  Pt is alert, does not follow commands appropriately, not in acute distress HEENT: No icterus, No thrush, No neck mass, /AT Cardiovascular: IRRR, S1/S2, no rubs, no gallops Respiratory: bibasilar rales.  No wheeze Abdomen: Soft/+BS, non tender, non distended, no guarding Extremities: No edema, No lymphangitis, No petechiae, No rashes, no synovitis   Data Reviewed: I have personally reviewed following labs and imaging studies Basic Metabolic Panel: Recent Labs  Lab 07/27/23 0834 07/29/23 0444 07/30/23 1008 07/31/23 0955 08/01/23 0505 08/02/23 0613  NA 141  --  143 143 143 141  K 3.6  --  3.3* 3.0* 3.5 2.8*  CL 105  --  102 103 103 104  CO2 30  --  29 31 32 28  GLUCOSE 82  --  80 70 66* 82  BUN 6*  --  5* 7* 7* 10  CREATININE 0.47  --  0.47 0.62 0.61 0.83  CALCIUM  8.5*  --  8.3* 8.4* 8.3* 8.4*  MG  --  1.7 2.0 1.8  --  1.6*   Liver Function Tests: Recent Labs  Lab 07/30/23 1008  AST 17  ALT 10  ALKPHOS 67  BILITOT 0.9  PROT 6.0*  ALBUMIN  2.7*   No results for input(s): LIPASE, AMYLASE in the last 168 hours. Recent Labs  Lab 07/30/23 1008  AMMONIA 14   Coagulation Profile: No results for input(s): INR, PROTIME in the last 168 hours. CBC: Recent Labs  Lab 07/27/23 0834 07/30/23 1008 07/31/23 0955  WBC 3.2* 10.4 7.3  HGB 10.9* 11.7* 11.0*  HCT 37.1 39.3 37.1  MCV 90.5 88.3 88.1  PLT  177 216 250   Cardiac Enzymes: No results for input(s): CKTOTAL, CKMB, CKMBINDEX, TROPONINI in the last 168 hours. BNP: Invalid input(s): POCBNP CBG: Recent Labs  Lab 08/02/23 0124 08/02/23 0230 08/02/23 0430 08/02/23 0750 08/02/23 1220  GLUCAP 51* 84 63* 88 90   HbA1C: No results for input(s): HGBA1C in the last 72 hours. Urine analysis:    Component Value Date/Time   COLORURINE YELLOW 07/05/2023 1934   APPEARANCEUR CLOUDY (A) 07/05/2023 1934   LABSPEC 1.018 07/05/2023 1934   PHURINE 6.0 07/05/2023 1934   GLUCOSEU NEGATIVE 07/05/2023 1934   HGBUR NEGATIVE 07/05/2023 1934   BILIRUBINUR NEGATIVE 07/05/2023 1934   KETONESUR 5 (A) 07/05/2023 1934   PROTEINUR NEGATIVE 07/05/2023 1934   UROBILINOGEN 0.2 05/08/2014 2240   NITRITE NEGATIVE 07/05/2023 1934   LEUKOCYTESUR LARGE (A) 07/05/2023 1934   Sepsis Labs: @LABRCNTIP (procalcitonin:4,lacticidven:4) ) Recent Results (from the past 240 hours)  Blood Culture (routine x 2)     Status: None   Collection Time: 07/24/23  1:23 PM   Specimen: BLOOD  Result Value Ref Range Status   Specimen Description  BLOOD LEFT ANTECUBITAL  Final   Special Requests   Final    BOTTLES DRAWN AEROBIC ONLY Blood Culture adequate volume   Culture   Final    NO GROWTH 5 DAYS Performed at [facility], [address], [place], [place] [address]    Report Status 07/29/2023 FINAL  Final  Blood Culture (routine x 2)     Status: None   Collection Time: 07/24/23  1:29 PM   Specimen: BLOOD  Result Value Ref Range Status   Specimen Description BLOOD BLOOD LEFT FOREARM  Final   Special Requests   Final    BOTTLES DRAWN AEROBIC AND ANAEROBIC Blood Culture results may not be optimal due to an inadequate volume of blood received in culture bottles   Culture   Final    NO GROWTH 5 DAYS Performed at [facility], [address] [place], [place] [address]    Report Status 07/29/2023 FINAL  Final  Resp panel by RT-PCR (RSV, Flu A&B, Covid)  Anterior Nasal Swab     Status: Abnormal   Collection Time: 07/24/23  2:42 PM   Specimen: Anterior Nasal Swab  Result Value Ref Range Status   SARS Coronavirus 2 by RT PCR NEGATIVE NEGATIVE Final    Comment: (NOTE) SARS-CoV-2 target nucleic acids are NOT DETECTED.  The SARS-CoV-2 RNA is generally detectable in upper respiratory specimens during the acute phase of infection. The lowest concentration of SARS-CoV-2 viral copies this assay can detect is 138 copies/mL. A negative result does not preclude SARS-Cov-2 infection and should not be used as the sole basis for treatment or other patient management decisions. A negative result may occur with  improper specimen collection/handling, submission of specimen other than nasopharyngeal swab, presence of viral mutation(s) within the areas targeted by this assay, and inadequate number of viral copies(<138 copies/mL). A negative result must be combined with clinical observations, patient history, and epidemiological information. The expected result is Negative.  Fact Sheet for Patients:  bloggercourse.com  Fact Sheet for Healthcare Providers:  seriousbroker.it  This test is no t yet approved or cleared by the [place]  FDA and  has been authorized for detection and/or diagnosis of SARS-CoV-2 by FDA under an Emergency Use Authorization (EUA). This EUA will remain  in effect (meaning this test can be used) for the duration of the COVID-19 declaration under Section 564(b)(1) of the Act, 21 U.S.C.section 360bbb-3(b)(1), unless the authorization is terminated  or revoked sooner.       Influenza A by PCR POSITIVE (A) NEGATIVE Final   Influenza B by PCR NEGATIVE NEGATIVE Final    Comment: (NOTE) The Xpert Xpress SARS-CoV-2/FLU/RSV plus assay is intended as an aid in the diagnosis of influenza from Nasopharyngeal swab specimens and should not be used as a sole basis for treatment. Nasal  washings and aspirates are unacceptable for Xpert Xpress SARS-CoV-2/FLU/RSV testing.  Fact Sheet for Patients: bloggercourse.com  Fact Sheet for Healthcare Providers: seriousbroker.it  This test is not yet approved or cleared by the [place]  FDA and has been authorized for detection and/or diagnosis of SARS-CoV-2 by FDA under an Emergency Use Authorization (EUA). This EUA will remain in effect (meaning this test can be used) for the duration of the COVID-19 declaration under Section 564(b)(1) of the Act, 21 U.S.C. section 360bbb-3(b)(1), unless the authorization is terminated or revoked.     Resp Syncytial Virus by PCR NEGATIVE NEGATIVE Final    Comment: (NOTE) Fact Sheet for Patients: bloggercourse.com  Fact Sheet for Healthcare Providers:  seriousbroker.it  This test is not yet approved or cleared by the [place]  FDA and has been authorized for detection and/or diagnosis of SARS-CoV-2 by FDA under an Emergency Use Authorization (EUA). This EUA will remain in effect (meaning this test can be used) for the duration of the COVID-19 declaration under Section 564(b)(1) of the Act, 21 U.S.C. section 360bbb-3(b)(1), unless the authorization is terminated or revoked.  Performed at [facility], [address] [place], [place] [address]   MRSA Next Gen by PCR, Nasal     Status: Abnormal   Collection Time: 07/25/23  1:20 PM   Specimen: Nasal Mucosa; Nasal Swab  Result Value Ref Range Status   MRSA by PCR Next Gen DETECTED (A) NOT DETECTED Final    Comment: RESULT CALLED TO, READ BACK BY AND VERIFIED WITH: BREANNA,RN ON 07/25/23 AT 1913 BY PURDIE,J        The GeneXpert MRSA Assay (FDA approved for NASAL specimens only), is one component of a comprehensive MRSA colonization surveillance program. It is not intended to diagnose MRSA infection nor to guide or monitor  treatment for MRSA infections. Performed at [facility], [address], [place], [place] [address]      Scheduled Meds:  apixaban   5 mg Oral BID   bisacodyl   10 mg Rectal Daily   budesonide  (PULMICORT ) nebulizer solution  0.25 mg Nebulization BID   Chlorhexidine  Gluconate Cloth  6 each Topical Daily   diltiazem   60 mg Oral TID   feeding supplement  237 mL Oral BID BM   ipratropium  0.5 mg Nebulization TID   levalbuterol   0.63 mg Nebulization TID   levothyroxine   50 mcg Oral QAC breakfast   metoprolol  tartrate  2.5 mg Intravenous Q6H   pantoprazole  (PROTONIX ) IV  40 mg Intravenous Q24H   sodium chloride  flush  10-40 mL Intracatheter Q12H   sodium chloride  flush  10-40 mL Intracatheter Q12H   sodium chloride  flush  10-40 mL Intracatheter Q12H   Continuous Infusions:  dextrose  40 mL/hr at 08/02/23 9391   piperacillin -tazobactam (ZOSYN )  IV 3.375 g (08/02/23 1101)   potassium chloride  10 mEq (08/02/23 1521)   vancomycin  Stopped (08/01/23 1940)    Procedures/Studies: MR BRAIN WO CONTRAST Result Date: 07/31/2023 CLINICAL DATA:  Mental status change, unknown cause. EXAM: MRI HEAD WITHOUT CONTRAST TECHNIQUE: Multiplanar, multiecho pulse sequences of the brain and surrounding structures were obtained without intravenous contrast. COMPARISON:  Head CT July 24, 2023. FINDINGS: The study is significantly degraded by motion. Brain: No acute infarction, hemorrhage, hydrocephalus, extra-axial collection or mass lesion. Remote right PCA territory infarct. At least mild to moderate chronic ischemic changes of the white matter. Moderate parenchymal volume loss. Vascular: Normal flow voids. Skull and upper cervical spine: No focal lesion identified. Sinuses/Orbits: Mucosal thickening of the paranasal sinuses with fluid level within the sphenoid sinuses. Other: None. IMPRESSION: 1. Significantly motion degraded study. 2. No acute intracranial abnormality identified. 3. Remote right PCA territory  infarct. 4. At least mild to moderate chronic ischemic changes of the white matter. 5. Moderate parenchymal volume loss. 6. Paranasal sinus disease. Electronically Signed   By: Katyucia  de Macedo Rodrigues M.D.   On: 07/31/2023 12:31   DG CHEST PORT 1 VIEW Result Date: 07/30/2023 CLINICAL DATA:  Altered mental status. Shortness of breath. Lethargy. EXAM: PORTABLE CHEST 1 VIEW COMPARISON:  Chest radiograph dated 07/25/2023. FINDINGS: Right-sided PICC in similar position. Left lung base atelectasis. Right upper lobe streaky densities may represent infiltrate. Small bilateral pleural effusions  suspected. No pneumothorax. Stable cardiac silhouette. Atherosclerotic calcification of the aorta. No acute osseous pathology. IMPRESSION: 1. Left lung base atelectasis. 2. Right upper lobe infiltrate. 3. Small bilateral pleural effusions. Electronically Signed   By: Vanetta Chou M.D.   On: 07/30/2023 14:15   ECHOCARDIOGRAM COMPLETE Result Date: 07/29/2023    ECHOCARDIOGRAM REPORT   Patient Name:   ANNINA PIOTROWSKI Date of Exam: 07/29/2023 Medical Rec #:  [id number]      Height:       66.0 in Accession #:    [id number]     Weight:       199.1 lb Date of Birth:  Mar 15, 1941       BSA:          1.996 m Patient Age:    82 years       BP:           147/104 mmHg Patient Gender: F              HR:           120 bpm. Exam Location:  Inpatient Procedure: 2D Echo, Cardiac Doppler and Color Doppler Indications:    A-fib  History:        Patient has prior history of Echocardiogram examinations, most                 recent 12/24/2017. CKD, Arrythmias:Atrial Fibrillation; Risk                 Factors:Hypertension, Diabetes, Dyslipidemia and Former Smoker.  Sonographer:    Tillman Nora RVT Referring Phys: [id number] JAN A MANSY  Sonographer Comments: Technically challenging study due to limited acoustic windows and Technically difficult study due to poor echo windows. Technically difficult due to elevated HR, patient supine, respiratory motion and  patients inability to tolerate exam. HR between 120 bpm-140 bpm IMPRESSIONS  1. Left ventricular ejection fraction, by estimation, is 55 to 60%. The left ventricle has normal function. Left ventricular endocardial border not optimally defined to evaluate regional wall motion. There is mild left ventricular hypertrophy. Left ventricular diastolic parameters are indeterminate.  2. RV not well visualized, grossly appears normal in size and function. . Right ventricular systolic function was not well visualized. The right ventricular size is not well visualized. Tricuspid regurgitation signal is inadequate for assessing PA pressure.  3. The mitral valve was not well visualized. Trivial mitral valve regurgitation. No evidence of mitral stenosis.  4. The aortic valve was not well visualized. There is mild calcification of the aortic valve. There is mild thickening of the aortic valve. Aortic valve regurgitation is not visualized. No aortic stenosis is present.  5. The inferior vena cava is normal in size with greater than 50% respiratory variability, suggesting right atrial pressure of 3 mmHg.  6. Technically difficult study in setting of limited visualization and elevated heart rates. FINDINGS  Left Ventricle: Left ventricular ejection fraction, by estimation, is 55 to 60%. The left ventricle has normal function. Left ventricular endocardial border not optimally defined to evaluate regional wall motion. The left ventricular internal cavity size was normal in size. There is mild left ventricular hypertrophy. Left ventricular diastolic parameters are indeterminate. Right Ventricle: RV not well visualized, grossly appears normal in size and function. The right ventricular size is not well visualized. Right vetricular wall thickness was not well visualized. Right ventricular systolic function was not well visualized.  Tricuspid regurgitation signal is inadequate for assessing PA pressure. Left Atrium: Left atrial size was  normal in size. Right Atrium: Right atrial size was normal in size. Pericardium: There is no evidence of pericardial effusion. Mitral Valve: The mitral valve was not well visualized. Trivial mitral valve regurgitation. No evidence of mitral valve stenosis. Tricuspid Valve: The tricuspid valve is not well visualized. Tricuspid valve regurgitation is trivial. No evidence of tricuspid stenosis. Aortic Valve: The aortic valve was not well visualized. There is mild calcification of the aortic valve. There is mild thickening of the aortic valve. There is mild aortic valve annular calcification. Aortic valve regurgitation is not visualized. No aortic stenosis is present. Aortic valve mean gradient measures 2.8 mmHg. Aortic valve peak gradient measures 5.3 mmHg. Aortic valve area, by VTI measures 2.17 cm. Pulmonic Valve: The pulmonic valve was not well visualized. Pulmonic valve regurgitation is not visualized. No evidence of pulmonic stenosis. Aorta: The aortic root and ascending aorta are structurally normal, with no evidence of dilitation. Venous: The inferior vena cava is normal in size with greater than 50% respiratory variability, suggesting right atrial pressure of 3 mmHg. IAS/Shunts: No atrial level shunt detected by color flow Doppler.  LEFT VENTRICLE PLAX 2D LVIDd:         4.10 cm LVIDs:         2.80 cm LV PW:         1.20 cm LV IVS:        1.20 cm LVOT diam:     2.00 cm LV SV:         32 LV SV Index:   16 LVOT Area:     3.14 cm  IVC IVC diam: 1.80 cm LEFT ATRIUM           Index LA diam:      3.70 cm 1.85 cm/m LA Vol (A4C): 34.9 ml 17.49 ml/m  AORTIC VALVE                    PULMONIC VALVE AV Area (Vmax):    2.30 cm     PV Vmax:       0.88 m/s AV Area (Vmean):   2.18 cm     PV Peak grad:  3.1 mmHg AV Area (VTI):     2.17 cm AV Vmax:           114.60 cm/s AV Vmean:          79.140 cm/s AV VTI:            0.148 m AV Peak Grad:      5.3 mmHg AV Mean Grad:      2.8 mmHg LVOT Vmax:         83.87 cm/s LVOT Vmean:         54.867 cm/s LVOT VTI:          0.102 m LVOT/AV VTI ratio: 0.69  AORTA Ao Root diam: 2.20 cm Ao Asc diam:  3.40 cm  SHUNTS Systemic VTI:  0.10 m Systemic Diam: 2.00 cm Dorn Ross MD Electronically signed by Dorn Ross MD Signature Date/Time: 07/29/2023/12:24:49 PM    Final    DG Chest Portable 1 View Result Date: 07/25/2023 CLINICAL DATA:  PICC line placement EXAM: PORTABLE CHEST 1 VIEW COMPARISON:  Chest radiograph and CT chest, abdomen, and pelvis dated 07/24/2023. FINDINGS: Right PICC line tip overlies the lower SVC. Low lung volumes. Stable cardiomegaly. Aortic atherosclerosis. Similar indistinctness of the left costophrenic angle, likely correlates with prominent subpleural fat noted on the prior CT. No focal consolidation, sizeable pleural effusion,  or pneumothorax. The visualized osseous structures are unchanged. IMPRESSION: 1. Right PICC line tip overlies the lower SVC. 2. Low lung volumes.  No acute cardiopulmonary findings. Electronically Signed   By: Harrietta Sherry M.D.   On: 07/25/2023 13:11   US  EKG SITE RITE Result Date: 07/25/2023 If Site Rite image not attached, placement could not be confirmed due to current cardiac rhythm.  CT CHEST ABDOMEN PELVIS W CONTRAST Result Date: 07/24/2023 CLINICAL DATA:  Sepsis. Urinary tract infection. Previous appendectomy, hysterectomy, oophorectomy and cholecystectomy. EXAM: CT CHEST, ABDOMEN, AND PELVIS WITH CONTRAST TECHNIQUE: Multidetector CT imaging of the chest, abdomen and pelvis was performed following the standard protocol during bolus administration of intravenous contrast. RADIATION DOSE REDUCTION: This exam was performed according to the departmental dose-optimization program which includes automated exposure control, adjustment of the mA and/or kV according to patient size and/or use of iterative reconstruction technique. CONTRAST:  OMNIPAQUE  IOHEXOL  300 MG/ML  SOLN COMPARISON:  Abdomen and pelvis CT dated 04/24/2018.  Portable chest obtained earlier today. Chest CT report dated 05/15/2019. Chest CTA dated 10/18/2008. FINDINGS: CT CHEST FINDINGS Cardiovascular: Stable mildly enlarged heart aortic valve calcifications and dense mitral valve annulus calcifications. Atheromatous calcifications, including the coronary arteries and aorta. The central pulmonary arteries remain mildly dilated with a main pulmonary artery diameter of 3.4 cm. Mediastinum/Nodes: No enlarged mediastinal, hilar, or axillary lymph nodes. Interval flattening of the trachea and mainstem bronchi with an interval decreased depth of inspiration. Unremarkable thyroid  gland and esophagus. Lungs/Pleura: Decreased depth of inspiration. Mild bilateral lower lobe cylindrical bronchiectasis and associated mild parenchymal densities. Prominent subpleural fat on the left causing the blunting of the lateral costophrenic angle seen radiographically. No pleural fluid Musculoskeletal: Thoracic spine degenerative changes multiple thoracic vertebral compression deformities with mild bony retropulsion and no visible acute fracture lines. Associated acute kyphosis in the upper thoracic spine. CT ABDOMEN PELVIS FINDINGS Hepatobiliary: No focal liver abnormality is seen. Status post cholecystectomy. No biliary dilatation. Pancreas: Unremarkable. No pancreatic ductal dilatation or surrounding inflammatory changes. Spleen: Normal in size without focal abnormality. Adrenals/Urinary Tract: Normal-appearing adrenal glands multiple bilateral simple appearing renal cysts. These do not need imaging follow-up unremarkable urinary bladder and ureters Stomach/Bowel: Large number of sigmoid colon diverticula without evidence of diverticulitis. Breathing motion blurring obscuring the detail in the right lower quadrant of the abdomen. There is some fluid in gas in that region of the abdomen that is most likely intraluminal. However, due to the motion artifact, it is difficult to exclude a fluid or  gas collection in that area. Surgically absent appendix by history. Unremarkable stomach. Vascular/Lymphatic: Atheromatous arterial calcifications without aneurysm. No enlarged lymph nodes. Reproductive: Status post hysterectomy. No adnexal masses. Other: Anterior subcutaneous edema and anterior subcutaneous air, compatible with recent subcutaneous injection. Moderate-sized left inguinal hernia containing fat and small to moderate-sized right inguinal hernia containing fat Musculoskeletal: Lumbar spine degenerative changes. Multiple lumbar spine vertebral compression deformities that are new since 04/24/2018. Stable old T12 vertebral compression deformity. Possible acute fracture line in the anterior, superior corner of the L2 vertebral body and possible acute fracture line in the superior endplate of the L4 vertebral body. IMPRESSION: 1. Fluid and gas in the right lower quadrant of the abdomen which is most likely within the bowel. However, this can not be confirmed as intraluminal due to motion artifacts in that area. Correlation with the presence or absence of focal pain and tenderness in that area is recommended to help exclude the possibility of an abscess or contained  perforation in the right lower quadrant of the abdomen. 2. Otherwise, no acute abnormality seen. 3. Decreased depth of inspiration with flattening of the trachea and mainstem bronchi, compatible with tracheobronchomalacia. 4. Mild bilateral lower lobe cylindrical bronchiectasis and associated mild parenchymal densities, most likely due to atelectasis. 5. Stable mild cardiomegaly. 6. Mildly dilated central pulmonary arteries, suggesting pulmonary arterial hypertension. 7. Multiple thoracic and lumbar spine vertebral compression deformities, some of which are new since 04/24/2018. Possible acute fracture lines in the anterior, superior corner of the L2 vertebral body and superior endplate of the L4 vertebral body. 8. Sigmoid colon diverticulosis.  9. Moderate-sized left inguinal hernia containing fat and small to moderate-sized right inguinal hernia containing fat. 10. Aortic atherosclerosis. Aortic Atherosclerosis (ICD10-I70.0). Electronically Signed   By: Elspeth Bathe M.D.   On: 07/24/2023 16:45   CT Head Wo Contrast Result Date: 07/24/2023 CLINICAL DATA:  Mental status change. Current therapy for urinary tract infection. EXAM: CT HEAD WITHOUT CONTRAST TECHNIQUE: Contiguous axial images were obtained from the base of the skull through the vertex without intravenous contrast. RADIATION DOSE REDUCTION: This exam was performed according to the departmental dose-optimization program which includes automated exposure control, adjustment of the mA and/or kV according to patient size and/or use of iterative reconstruction technique. COMPARISON:  01/05/2023 FINDINGS: Brain: No evidence of acute infarction, hemorrhage, hydrocephalus, extra-axial collection or mass lesion/mass effect. Vascular: Encephalomalacia within the right occipital and posterior temporal lobes consistent with remote PCA infarct. Ex vacuo dilatation of the right posterolateral ventricle noted. There is mild diffuse low-attenuation within the subcortical and periventricular white matter compatible with chronic microvascular disease. Skull: Normal. Negative for fracture or focal lesion. Sinuses/Orbits: Air-fluid level within the sphenoid sinus. Partial opacification of the ethmoid air cells. Other: None. IMPRESSION: 1. No acute intracranial abnormalities. 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 4. Air-fluid level within the sphenoid sinus. Correlate for any clinical signs or symptoms of acute sinusitis. Electronically Signed   By: Waddell Calk M.D.   On: 07/24/2023 16:15   DG Chest Port 1 View Result Date: 07/24/2023 CLINICAL DATA:  Questionable sepsis - evaluate for abnormality. Weakness. Altered mental status. EXAM: PORTABLE CHEST 1 VIEW COMPARISON:  01/05/2023.  FINDINGS: Redemonstration of blunting of left lateral costophrenic angle, similar to the prior study, which may represent small left pleural effusion. There are probable associated atelectatic changes at the left lung base. Bilateral lung fields are otherwise clear. No acute consolidation or lung collapse. Right lateral costophrenic angle is clear. Stable cardio-mediastinal silhouette. No acute osseous abnormalities. The soft tissues are within normal limits. There are surgical clips in the right upper quadrant, typical of a previous cholecystectomy. IMPRESSION: *Persistent blunting of left lateral costophrenic angle, which may represent small left pleural effusion. *Otherwise no acute cardiopulmonary abnormality seen. Electronically Signed   By: Ree Molt M.D.   On: 07/24/2023 14:16   DG Pelvis 1-2 Views Result Date: 07/05/2023 CLINICAL DATA:  Status post fall. EXAM: PELVIS - 1-2 VIEW COMPARISON:  None Available. FINDINGS: There is no evidence of pelvic fracture or diastasis. No pelvic bone lesions are seen. Mild degenerative changes are seen involving both hips in the form of joint space narrowing and acetabular sclerosis. IMPRESSION: Mild degenerative changes involving both hips. Electronically Signed   By: Suzen Dials M.D.   On: 07/05/2023 20:35   DG Knee Complete 4 Views Left Result Date: 07/05/2023 CLINICAL DATA:  Witnessed fall. EXAM: LEFT KNEE - COMPLETE 4+ VIEW COMPARISON:  None Available. FINDINGS:  No evidence of an acute fracture, dislocation, or joint effusion. Medial marginal osteophytes are noted. There is moderate to marked severity tricompartmental joint space narrowing. Soft tissues are unremarkable. 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 Electronically Signed   By: Suzen Dials M.D.   On: 07/05/2023 20:33   DG Knee Complete 4 Views Right Result Date: 07/05/2023 CLINICAL DATA:  Status post fall. EXAM: RIGHT KNEE - COMPLETE 4+ VIEW  COMPARISON:  None Available. FINDINGS: No evidence of an acute fracture, dislocation, or joint effusion. A small, chronic appearing cortical deformity versus medial marginal osteophyte is seen along the proximal right tibia. Moderate to marked severity tricompartmental joint space narrowing is seen. Soft tissues are unremarkable. 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 Electronically Signed   By: Suzen Dials M.D.   On: 07/05/2023 20:31   DG Forearm Right Result Date: 07/05/2023 CLINICAL DATA:  Status post fall. EXAM: RIGHT FOREARM - 2 VIEW COMPARISON:  None Available. FINDINGS: There is no evidence of fracture or other focal bone lesions. Moderate to marked severity focal soft tissue swelling is seen along the dorsal aspect of the mid right forearm. IMPRESSION: Moderate to marked severity focal soft tissue swelling along the dorsal aspect of the mid right forearm. Electronically Signed   By: Suzen Dials M.D.   On: 07/05/2023 20:26    Alm Schneider, DO  [facility]  If 7PM-7AM, please contact night-coverage [email or web address] Password [facility] 08/02/2023, 3:44 PM   LOS: 9 days

## 2023-08-02 NOTE — Progress Notes (Signed)
   08/02/23 0534 08/02/23 0535  Vitals  BP (!) 83/44 (!) 68/58  MAP (mmHg) (!) 58 (!) 64  BP Location Left Arm (forarm)  --   Patient Position (if appropriate) Lying  --   Pulse Rate 92 98  Pulse Rate Source Dinamap  --   MEWS COLOR  MEWS Score Color Yellow Red  Oxygen  Therapy  SpO2 100 %  --   O2 Device Room Air  --   MEWS Score  MEWS Temp 0 0  MEWS Systolic 1 3  MEWS Pulse 0 0  MEWS RR 0 0  MEWS LOC 1 1  MEWS Score 2 4

## 2023-08-03 DIAGNOSIS — G9340 Encephalopathy, unspecified: Secondary | ICD-10-CM | POA: Diagnosis not present

## 2023-08-03 DIAGNOSIS — N179 Acute kidney failure, unspecified: Secondary | ICD-10-CM | POA: Diagnosis not present

## 2023-08-03 DIAGNOSIS — I4891 Unspecified atrial fibrillation: Secondary | ICD-10-CM | POA: Diagnosis not present

## 2023-08-03 DIAGNOSIS — I480 Paroxysmal atrial fibrillation: Secondary | ICD-10-CM | POA: Diagnosis not present

## 2023-08-03 LAB — BASIC METABOLIC PANEL
Anion gap: 8 (ref 5–15)
BUN: 13 mg/dL (ref 8–23)
CO2: 28 mmol/L (ref 22–32)
Calcium: 8.5 mg/dL — ABNORMAL LOW (ref 8.9–10.3)
Chloride: 101 mmol/L (ref 98–111)
Creatinine, Ser: 1.63 mg/dL — ABNORMAL HIGH (ref 0.44–1.00)
GFR, Estimated: 31 mL/min — ABNORMAL LOW (ref >60)
Glucose, Bld: 71 mg/dL (ref 70–99)
Potassium: 3.2 mmol/L — ABNORMAL LOW (ref 3.5–5.1)
Sodium: 137 mmol/L (ref 135–145)

## 2023-08-03 LAB — GLUCOSE, CAPILLARY
Glucose-Capillary: 76 mg/dL (ref 70–99)
Glucose-Capillary: 77 mg/dL (ref 70–99)
Glucose-Capillary: 82 mg/dL (ref 70–99)
Glucose-Capillary: 82 mg/dL (ref 70–99)
Glucose-Capillary: 83 mg/dL (ref 70–99)
Glucose-Capillary: 96 mg/dL (ref 70–99)

## 2023-08-03 LAB — MAGNESIUM: Magnesium: 2 mg/dL (ref 1.7–2.4)

## 2023-08-03 MED ORDER — KCL IN DEXTROSE-NACL 20-5-0.9 MEQ/L-%-% IV SOLN: INTRAVENOUS | Status: DC

## 2023-08-03 MED ORDER — BUDESONIDE 0.5 MG/2ML IN SUSP
0.5000 mg | Freq: Two times a day (BID) | RESPIRATORY_TRACT | Status: DC
  Administered 2023-08-03 – 2023-08-07 (×8): 0.5 mg via RESPIRATORY_TRACT
  Filled 2023-08-03 (×8): qty 2

## 2023-08-03 MED ORDER — POTASSIUM CHLORIDE IN NACL 20-0.9 MEQ/L-% IV SOLN: INTRAVENOUS | Status: DC

## 2023-08-03 MED ORDER — VANCOMYCIN VARIABLE DOSE PER UNSTABLE RENAL FUNCTION (PHARMACIST DOSING): Status: DC

## 2023-08-03 NOTE — Progress Notes (Signed)
 Pharmacy Antibiotic Note  [name] is a 83 y.o. female admitted on 07/24/2023 with pneumonia.  Pharmacy has been consulted for vancomycin  dosing.  Plan: Worsening renal function- Scr up to 1.63 HOLD vancomycin  and get level tonight Zosyn  3.375g IV every 8 hours. Monitor labs, c/s, and vanco levels as indicated.  Height: 5' 6 (167.6 cm) Weight: 90.3 kg (199 lb 1.2 oz) IBW/kg (Calculated) : 59.3  Temp (24hrs), Avg:97.9 F (36.6 C), Min:97.6 F (36.4 C), Max:98.1 F (36.7 C)  Recent Labs  Lab 07/30/23 1008 07/31/23 0955 08/01/23 0505 08/02/23 0613 08/03/23 0406  WBC 10.4 7.3  --   --   --   CREATININE 0.47 0.62 0.61 0.83 1.63*    Estimated Creatinine Clearance: 29.1 mL/min (A) (by C-G formula based on SCr of 1.63 mg/dL (H)).    Allergies  Allergen Reactions   Cefprozil  Nausea And Vomiting    Unknown   Demerol  Nausea And Vomiting   Meperidine  Hcl Other (See Comments)    Unknown    Antimicrobials this admission: Vanco 2/5 >>  Zosyn  2/5 >>   Microbiology results: 1/30 BCx: ng 1/31 MRSA PCR: positive Influenza A +  Thank you for allowing pharmacy to be a part of this patient's care.  Elspeth Sour, PharmD Clinical Pharmacist 08/03/2023 1:44 PM

## 2023-08-03 NOTE — Progress Notes (Addendum)
 PROGRESS NOTE  [name] FMW:[id number] DOB: 11-05-1940 DOA: 07/24/2023 PCP: Alphonsa Elsie RAMAN, MD (Inactive)  Brief History:  83 y.o. female, with a history of paroxysmal atrial fibrillation, diabetes mellitus type 2, hypertension, dysphagia, pulmonary embolus, hypothyroidism, and esophageal dysmotility, dementia, living at SNF for last 5 years, patient with advanced dementia at baseline, bedbound as discussed with family. -Patient was brought by ED secondary to altered mental status, increased lethargy, patient was noted to be altered since yesterday, apparently she is on antibiotics for UTI, as discussed with family, she is with advanced dementia, but usually communicative, remember some family members, bedbound at baseline.   -In ED she was altered, CT head with no acute findings, significant for chronic infarcts, CT abdomen pelvics with questionable right lower quadrant gas-fluid collection, likely within the bowels, but due to emotional artifact could not rule out abscess or perforated fluid collection, her respiratory panel significant for positive influenza A, [facility] hospitalist consulted to admit   Assessment/Plan: Acute metabolic encephalopathy -due to infectious process, including influenza -2/8 more awake but remains confused and intermittently agitated--very po po intake--essentially zero -CT head with no acute findings -Per reports patient was treated for UTI at facility prior to arrival  -palliative medicine consultation due to failure to thrive in setting of dementia -B12 1363 -folate 9.1 -TSH 2.332 -ammonia 14 -2/6 MR brain-no acute finding -VBG--7.42/55/32/36   Influenza infection -continue Tamiflu --completed course -1/30 CT chest--Mild bilateral lower lobe cylindrical bronchiectasis and associated mild parenchymal densities. -check PCT <0.10   Aspiration pneumonitis -personally reviewed CXR 2/5--RUL opacity, bibasilar atelectasis -continue  zosyn  -MRSA+  AKI -baseline creatinine 0.4-0.7 -serum creatinine peaked 1.63 -d/c vanc -start D5NS -am BMP   Hypoglycemia -due to poor po intake -started D5W>>switched to D5NS   Persistent A-fib with RVR - resolved  -On apixaban  for anticoagulation -currently off Cardizem  drip and back on oral cardizem  >>no on IV lopressor  -2/8 d/c po diltiazem  due to soft BP, continue IV lopressor  -recurrence of Afib RVR overnight 2/4, started on IV amio by night provider, cardiology consulted and now back on oral diltiazem  and off IV amiodarone    Questionable right lower quadrant fluid collection, likely just just fluid-gas most likely within the bowel -CT abdomen showing fluid and gas in the right lower quadrant of the abdomen, possibly within bowel, been difficult to confirm due to motion artifact , mentation to exclude possibility of abscess or contained perforation -Consulted general surgery - see consult notes  -advanced to clears diet per surgeon>>now tolerating regular diet -ok to resume apixaban  per surgeon -surgery signing off 2/2. Ok for diet; advanced to dys 1 on 2/2    History of PE -Resumed apixaban     Major neurocognitive disorder Deconditioning -Bedbound at baseline -Continue with supportive care   Hyperlipidemia -continue with statin   Hypothyroidism -Continue with Synthroid    Hypertension -Continue with lopressor     Right forearm fluid collection likely hematoma -Is not warm or tender, likely related to hematoma from recent fall, ED physician attempted to aspirate only small amount of blood came out   Hypokalemia -replete -mag 2.0   Goals of care -2/8--discussed with brother -2/8 brother confirms DNR, continue treat any reversible or treatable conditions; if no improvement in next 48 hours, would consider transition to comfort measures             Family Communication:   brother updated 2/9   Consultants:  palliative   Code Status: DNR  DVT  Prophylaxis:  apixaban      Procedures: As Listed in Progress Note Above   Antibiotics: Zosyn  2/5>> Vanc 2/6>>2/8        Subjective: Pt is awake and alert.  Denies f/c, cp, n/v/d  Objective: Vitals:   08/03/23 0800 08/03/23 1111 08/03/23 1300 08/03/23 1403  BP: 119/87 127/83 110/86   Pulse: 87 93 85   Resp: 16  18   Temp: 97.6 F (36.4 C)  97.9 F (36.6 C)   TempSrc: Oral  Oral   SpO2: 98%  98% 98%  Weight:      Height:        Intake/Output Summary (Last 24 hours) at 08/03/2023 1622 Last data filed at 08/03/2023 0900 Gross per 24 hour  Intake 557.82 ml  Output 100 ml  Net 457.82 ml   Weight change:  Exam:  General:  Pt is alert, does not follow commands appropriately, not in acute distress HEENT: No icterus, No thrush, No neck mass, [place]/AT Cardiovascular: RRR, S1/S2, no rubs, no gallops Respiratory: bibasilar rales.  No wheeze Abdomen: Soft/+BS, non tender, non distended, no guarding Extremities: No edema, No lymphangitis, No petechiae, No rashes, no synovitis   Data Reviewed: I have personally reviewed following labs and imaging studies Basic Metabolic Panel: Recent Labs  Lab 07/29/23 0444 07/30/23 1008 07/31/23 0955 08/01/23 0505 08/02/23 0613 08/03/23 0406  NA  --  143 143 143 141 137  K  --  3.3* 3.0* 3.5 2.8* 3.2*  CL  --  102 103 103 104 101  CO2  --  29 31 32 28 28  GLUCOSE  --  80 70 66* 82 71  BUN  --  5* 7* 7* 10 13  CREATININE  --  0.47 0.62 0.61 0.83 1.63*  CALCIUM   --  8.3* 8.4* 8.3* 8.4* 8.5*  MG 1.7 2.0 1.8  --  1.6* 2.0   Liver Function Tests: Recent Labs  Lab 07/30/23 1008  AST 17  ALT 10  ALKPHOS 67  BILITOT 0.9  PROT 6.0*  ALBUMIN  2.7*   No results for input(s): LIPASE, AMYLASE in the last 168 hours. Recent Labs  Lab 07/30/23 1008  AMMONIA 14   Coagulation Profile: No results for input(s): INR, PROTIME in the last 168 hours. CBC: Recent Labs  Lab 07/30/23 1008 07/31/23 0955  WBC 10.4 7.3  HGB 11.7*  11.0*  HCT 39.3 37.1  MCV 88.3 88.1  PLT 216 250   Cardiac Enzymes: No results for input(s): CKTOTAL, CKMB, CKMBINDEX, TROPONINI in the last 168 hours. BNP: Invalid input(s): POCBNP CBG: Recent Labs  Lab 08/02/23 2016 08/03/23 0006 08/03/23 0403 08/03/23 0749 08/03/23 1200  GLUCAP 91 77 76 83 82   HbA1C: No results for input(s): HGBA1C in the last 72 hours. Urine analysis:    Component Value Date/Time   COLORURINE YELLOW 07/05/2023 1934   APPEARANCEUR CLOUDY (A) 07/05/2023 1934   LABSPEC 1.018 07/05/2023 1934   PHURINE 6.0 07/05/2023 1934   GLUCOSEU NEGATIVE 07/05/2023 1934   HGBUR NEGATIVE 07/05/2023 1934   BILIRUBINUR NEGATIVE 07/05/2023 1934   KETONESUR 5 (A) 07/05/2023 1934   PROTEINUR NEGATIVE 07/05/2023 1934   UROBILINOGEN 0.2 05/08/2014 2240   NITRITE NEGATIVE 07/05/2023 1934   LEUKOCYTESUR LARGE (A) 07/05/2023 1934   Sepsis Labs: @LABRCNTIP (procalcitonin:4,lacticidven:4) ) Recent Results (from the past 240 hours)  MRSA Next Gen by PCR, Nasal     Status: Abnormal   Collection Time: 07/25/23  1:20 PM   Specimen: Nasal Mucosa; Nasal  Swab  Result Value Ref Range Status   MRSA by PCR Next Gen DETECTED (A) NOT DETECTED Final    Comment: RESULT CALLED TO, READ BACK BY AND VERIFIED WITH: BREANNA,RN ON 07/25/23 AT 1913 BY PURDIE,J        The GeneXpert MRSA Assay (FDA approved for NASAL specimens only), is one component of a comprehensive MRSA colonization surveillance program. It is not intended to diagnose MRSA infection nor to guide or monitor treatment for MRSA infections. Performed at [facility], [address] [place], [place] [address]      Scheduled Meds:  apixaban   5 mg Oral BID   bisacodyl   10 mg Rectal Daily   budesonide  (PULMICORT ) nebulizer solution  0.25 mg Nebulization BID   Chlorhexidine  Gluconate Cloth  6 each Topical Daily   feeding supplement  237 mL Oral BID BM   ipratropium  0.5 mg Nebulization TID   levalbuterol    0.63 mg Nebulization TID   levothyroxine   50 mcg Oral QAC breakfast   metoprolol  tartrate  2.5 mg Intravenous Q6H   pantoprazole  (PROTONIX ) IV  40 mg Intravenous Q24H   sodium chloride  flush  10-40 mL Intracatheter Q12H   sodium chloride  flush  10-40 mL Intracatheter Q12H   sodium chloride  flush  10-40 mL Intracatheter Q12H   Continuous Infusions:  dextrose  5 % and 0.9 % NaCl with KCl 20 mEq/L 75 mL/hr at 08/03/23 1553   piperacillin -tazobactam (ZOSYN )  IV 3.375 g (08/03/23 1112)    Procedures/Studies: MR BRAIN WO CONTRAST Result Date: 07/31/2023 CLINICAL DATA:  Mental status change, unknown cause. EXAM: MRI HEAD WITHOUT CONTRAST TECHNIQUE: Multiplanar, multiecho pulse sequences of the brain and surrounding structures were obtained without intravenous contrast. COMPARISON:  Head CT July 24, 2023. FINDINGS: The study is significantly degraded by motion. Brain: No acute infarction, hemorrhage, hydrocephalus, extra-axial collection or mass lesion. Remote right PCA territory infarct. At least mild to moderate chronic ischemic changes of the white matter. Moderate parenchymal volume loss. Vascular: Normal flow voids. Skull and upper cervical spine: No focal lesion identified. Sinuses/Orbits: Mucosal thickening of the paranasal sinuses with fluid level within the sphenoid sinuses. Other: None. IMPRESSION: 1. Significantly motion degraded study. 2. No acute intracranial abnormality identified. 3. Remote right PCA territory infarct. 4. At least mild to moderate chronic ischemic changes of the white matter. 5. Moderate parenchymal volume loss. 6. Paranasal sinus disease. Electronically Signed   By: Katyucia  de Macedo Rodrigues M.D.   On: 07/31/2023 12:31   DG CHEST PORT 1 VIEW Result Date: 07/30/2023 CLINICAL DATA:  Altered mental status. Shortness of breath. Lethargy. EXAM: PORTABLE CHEST 1 VIEW COMPARISON:  Chest radiograph dated 07/25/2023. FINDINGS: Right-sided PICC in similar position. Left lung base  atelectasis. Right upper lobe streaky densities may represent infiltrate. Small bilateral pleural effusions suspected. No pneumothorax. Stable cardiac silhouette. Atherosclerotic calcification of the aorta. No acute osseous pathology. IMPRESSION: 1. Left lung base atelectasis. 2. Right upper lobe infiltrate. 3. Small bilateral pleural effusions. Electronically Signed   By: Vanetta Chou M.D.   On: 07/30/2023 14:15   ECHOCARDIOGRAM COMPLETE Result Date: 07/29/2023    ECHOCARDIOGRAM REPORT   Patient Name:   ALEXEA BLASE Date of Exam: 07/29/2023 Medical Rec #:  [id number]      Height:       66.0 in Accession #:    [id number]     Weight:       199.1 lb Date of Birth:  Jun 09, 1941       BSA:  1.996 m Patient Age:    82 years       BP:           147/104 mmHg Patient Gender: F              HR:           120 bpm. Exam Location:  Inpatient Procedure: 2D Echo, Cardiac Doppler and Color Doppler Indications:    A-fib  History:        Patient has prior history of Echocardiogram examinations, most                 recent 12/24/2017. CKD, Arrythmias:Atrial Fibrillation; Risk                 Factors:Hypertension, Diabetes, Dyslipidemia and Former Smoker.  Sonographer:    Tillman Nora RVT Referring Phys: [id number] JAN A MANSY  Sonographer Comments: Technically challenging study due to limited acoustic windows and Technically difficult study due to poor echo windows. Technically difficult due to elevated HR, patient supine, respiratory motion and patients inability to tolerate exam. HR between 120 bpm-140 bpm IMPRESSIONS  1. Left ventricular ejection fraction, by estimation, is 55 to 60%. The left ventricle has normal function. Left ventricular endocardial border not optimally defined to evaluate regional wall motion. There is mild left ventricular hypertrophy. Left ventricular diastolic parameters are indeterminate.  2. RV not well visualized, grossly appears normal in size and function. . Right ventricular systolic function was  not well visualized. The right ventricular size is not well visualized. Tricuspid regurgitation signal is inadequate for assessing PA pressure.  3. The mitral valve was not well visualized. Trivial mitral valve regurgitation. No evidence of mitral stenosis.  4. The aortic valve was not well visualized. There is mild calcification of the aortic valve. There is mild thickening of the aortic valve. Aortic valve regurgitation is not visualized. No aortic stenosis is present.  5. The inferior vena cava is normal in size with greater than 50% respiratory variability, suggesting right atrial pressure of 3 mmHg.  6. Technically difficult study in setting of limited visualization and elevated heart rates. FINDINGS  Left Ventricle: Left ventricular ejection fraction, by estimation, is 55 to 60%. The left ventricle has normal function. Left ventricular endocardial border not optimally defined to evaluate regional wall motion. The left ventricular internal cavity size was normal in size. There is mild left ventricular hypertrophy. Left ventricular diastolic parameters are indeterminate. Right Ventricle: RV not well visualized, grossly appears normal in size and function. The right ventricular size is not well visualized. Right vetricular wall thickness was not well visualized. Right ventricular systolic function was not well visualized.  Tricuspid regurgitation signal is inadequate for assessing PA pressure. Left Atrium: Left atrial size was normal in size. Right Atrium: Right atrial size was normal in size. Pericardium: There is no evidence of pericardial effusion. Mitral Valve: The mitral valve was not well visualized. Trivial mitral valve regurgitation. No evidence of mitral valve stenosis. Tricuspid Valve: The tricuspid valve is not well visualized. Tricuspid valve regurgitation is trivial. No evidence of tricuspid stenosis. Aortic Valve: The aortic valve was not well visualized. There is mild calcification of the aortic  valve. There is mild thickening of the aortic valve. There is mild aortic valve annular calcification. Aortic valve regurgitation is not visualized. No aortic stenosis is present. Aortic valve mean gradient measures 2.8 mmHg. Aortic valve peak gradient measures 5.3 mmHg. Aortic valve area, by VTI measures 2.17 cm. Pulmonic Valve: The pulmonic valve  was not well visualized. Pulmonic valve regurgitation is not visualized. No evidence of pulmonic stenosis. Aorta: The aortic root and ascending aorta are structurally normal, with no evidence of dilitation. Venous: The inferior vena cava is normal in size with greater than 50% respiratory variability, suggesting right atrial pressure of 3 mmHg. IAS/Shunts: No atrial level shunt detected by color flow Doppler.  LEFT VENTRICLE PLAX 2D LVIDd:         4.10 cm LVIDs:         2.80 cm LV PW:         1.20 cm LV IVS:        1.20 cm LVOT diam:     2.00 cm LV SV:         32 LV SV Index:   16 LVOT Area:     3.14 cm  IVC IVC diam: 1.80 cm LEFT ATRIUM           Index LA diam:      3.70 cm 1.85 cm/m LA Vol (A4C): 34.9 ml 17.49 ml/m  AORTIC VALVE                    PULMONIC VALVE AV Area (Vmax):    2.30 cm     PV Vmax:       0.88 m/s AV Area (Vmean):   2.18 cm     PV Peak grad:  3.1 mmHg AV Area (VTI):     2.17 cm AV Vmax:           114.60 cm/s AV Vmean:          79.140 cm/s AV VTI:            0.148 m AV Peak Grad:      5.3 mmHg AV Mean Grad:      2.8 mmHg LVOT Vmax:         83.87 cm/s LVOT Vmean:        54.867 cm/s LVOT VTI:          0.102 m LVOT/AV VTI ratio: 0.69  AORTA Ao Root diam: 2.20 cm Ao Asc diam:  3.40 cm  SHUNTS Systemic VTI:  0.10 m Systemic Diam: 2.00 cm Dorn Ross MD Electronically signed by Dorn Ross MD Signature Date/Time: 07/29/2023/12:24:49 PM    Final    DG Chest Portable 1 View Result Date: 07/25/2023 CLINICAL DATA:  PICC line placement EXAM: PORTABLE CHEST 1 VIEW COMPARISON:  Chest radiograph and CT chest, abdomen, and pelvis dated 07/24/2023.  FINDINGS: Right PICC line tip overlies the lower SVC. Low lung volumes. Stable cardiomegaly. Aortic atherosclerosis. Similar indistinctness of the left costophrenic angle, likely correlates with prominent subpleural fat noted on the prior CT. No focal consolidation, sizeable pleural effusion, or pneumothorax. The visualized osseous structures are unchanged. IMPRESSION: 1. Right PICC line tip overlies the lower SVC. 2. Low lung volumes.  No acute cardiopulmonary findings. Electronically Signed   By: Harrietta Sherry M.D.   On: 07/25/2023 13:11   US  EKG SITE RITE Result Date: 07/25/2023 If Site Rite image not attached, placement could not be confirmed due to current cardiac rhythm.  CT CHEST ABDOMEN PELVIS W CONTRAST Result Date: 07/24/2023 CLINICAL DATA:  Sepsis. Urinary tract infection. Previous appendectomy, hysterectomy, oophorectomy and cholecystectomy. EXAM: CT CHEST, ABDOMEN, AND PELVIS WITH CONTRAST TECHNIQUE: Multidetector CT imaging of the chest, abdomen and pelvis was performed following the standard protocol during bolus administration of intravenous contrast. RADIATION DOSE REDUCTION: This exam was performed according to  the departmental dose-optimization program which includes automated exposure control, adjustment of the mA and/or kV according to patient size and/or use of iterative reconstruction technique. CONTRAST:  OMNIPAQUE  IOHEXOL  300 MG/ML  SOLN COMPARISON:  Abdomen and pelvis CT dated 04/24/2018. Portable chest obtained earlier today. Chest CT report dated 05/15/2019. Chest CTA dated 10/18/2008. FINDINGS: CT CHEST FINDINGS Cardiovascular: Stable mildly enlarged heart aortic valve calcifications and dense mitral valve annulus calcifications. Atheromatous calcifications, including the coronary arteries and aorta. The central pulmonary arteries remain mildly dilated with a main pulmonary artery diameter of 3.4 cm. Mediastinum/Nodes: No enlarged mediastinal, hilar, or axillary lymph  nodes. Interval flattening of the trachea and mainstem bronchi with an interval decreased depth of inspiration. Unremarkable thyroid  gland and esophagus. Lungs/Pleura: Decreased depth of inspiration. Mild bilateral lower lobe cylindrical bronchiectasis and associated mild parenchymal densities. Prominent subpleural fat on the left causing the blunting of the lateral costophrenic angle seen radiographically. No pleural fluid Musculoskeletal: Thoracic spine degenerative changes multiple thoracic vertebral compression deformities with mild bony retropulsion and no visible acute fracture lines. Associated acute kyphosis in the upper thoracic spine. CT ABDOMEN PELVIS FINDINGS Hepatobiliary: No focal liver abnormality is seen. Status post cholecystectomy. No biliary dilatation. Pancreas: Unremarkable. No pancreatic ductal dilatation or surrounding inflammatory changes. Spleen: Normal in size without focal abnormality. Adrenals/Urinary Tract: Normal-appearing adrenal glands multiple bilateral simple appearing renal cysts. These do not need imaging follow-up unremarkable urinary bladder and ureters Stomach/Bowel: Large number of sigmoid colon diverticula without evidence of diverticulitis. Breathing motion blurring obscuring the detail in the right lower quadrant of the abdomen. There is some fluid in gas in that region of the abdomen that is most likely intraluminal. However, due to the motion artifact, it is difficult to exclude a fluid or gas collection in that area. Surgically absent appendix by history. Unremarkable stomach. Vascular/Lymphatic: Atheromatous arterial calcifications without aneurysm. No enlarged lymph nodes. Reproductive: Status post hysterectomy. No adnexal masses. Other: Anterior subcutaneous edema and anterior subcutaneous air, compatible with recent subcutaneous injection. Moderate-sized left inguinal hernia containing fat and small to moderate-sized right inguinal hernia containing fat  Musculoskeletal: Lumbar spine degenerative changes. Multiple lumbar spine vertebral compression deformities that are new since 04/24/2018. Stable old T12 vertebral compression deformity. Possible acute fracture line in the anterior, superior corner of the L2 vertebral body and possible acute fracture line in the superior endplate of the L4 vertebral body. IMPRESSION: 1. Fluid and gas in the right lower quadrant of the abdomen which is most likely within the bowel. However, this can not be confirmed as intraluminal due to motion artifacts in that area. Correlation with the presence or absence of focal pain and tenderness in that area is recommended to help exclude the possibility of an abscess or contained perforation in the right lower quadrant of the abdomen. 2. Otherwise, no acute abnormality seen. 3. Decreased depth of inspiration with flattening of the trachea and mainstem bronchi, compatible with tracheobronchomalacia. 4. Mild bilateral lower lobe cylindrical bronchiectasis and associated mild parenchymal densities, most likely due to atelectasis. 5. Stable mild cardiomegaly. 6. Mildly dilated central pulmonary arteries, suggesting pulmonary arterial hypertension. 7. Multiple thoracic and lumbar spine vertebral compression deformities, some of which are new since 04/24/2018. Possible acute fracture lines in the anterior, superior corner of the L2 vertebral body and superior endplate of the L4 vertebral body. 8. Sigmoid colon diverticulosis. 9. Moderate-sized left inguinal hernia containing fat and small to moderate-sized right inguinal hernia containing fat. 10. Aortic atherosclerosis. Aortic Atherosclerosis (ICD10-I70.0). Electronically Signed  By: Elspeth Bathe M.D.   On: 07/24/2023 16:45   CT Head Wo Contrast Result Date: 07/24/2023 CLINICAL DATA:  Mental status change. Current therapy for urinary tract infection. EXAM: CT HEAD WITHOUT CONTRAST TECHNIQUE: Contiguous axial images were obtained from the  base of the skull through the vertex without intravenous contrast. RADIATION DOSE REDUCTION: This exam was performed according to the departmental dose-optimization program which includes automated exposure control, adjustment of the mA and/or kV according to patient size and/or use of iterative reconstruction technique. COMPARISON:  01/05/2023 FINDINGS: Brain: No evidence of acute infarction, hemorrhage, hydrocephalus, extra-axial collection or mass lesion/mass effect. Vascular: Encephalomalacia within the right occipital and posterior temporal lobes consistent with remote PCA infarct. Ex vacuo dilatation of the right posterolateral ventricle noted. There is mild diffuse low-attenuation within the subcortical and periventricular white matter compatible with chronic microvascular disease. Skull: Normal. Negative for fracture or focal lesion. Sinuses/Orbits: Air-fluid level within the sphenoid sinus. Partial opacification of the ethmoid air cells. Other: None. IMPRESSION: 1. No acute intracranial abnormalities. 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 4. Air-fluid level within the sphenoid sinus. Correlate for any clinical signs or symptoms of acute sinusitis. Electronically Signed   By: Waddell Calk M.D.   On: 07/24/2023 16:15   DG Chest Port 1 View Result Date: 07/24/2023 CLINICAL DATA:  Questionable sepsis - evaluate for abnormality. Weakness. Altered mental status. EXAM: PORTABLE CHEST 1 VIEW COMPARISON:  01/05/2023. FINDINGS: Redemonstration of blunting of left lateral costophrenic angle, similar to the prior study, which may represent small left pleural effusion. There are probable associated atelectatic changes at the left lung base. Bilateral lung fields are otherwise clear. No acute consolidation or lung collapse. Right lateral costophrenic angle is clear. Stable cardio-mediastinal silhouette. No acute osseous abnormalities. The soft tissues are within normal limits. There are  surgical clips in the right upper quadrant, typical of a previous cholecystectomy. IMPRESSION: *Persistent blunting of left lateral costophrenic angle, which may represent small left pleural effusion. *Otherwise no acute cardiopulmonary abnormality seen. Electronically Signed   By: Ree Molt M.D.   On: 07/24/2023 14:16   DG Pelvis 1-2 Views Result Date: 07/05/2023 CLINICAL DATA:  Status post fall. EXAM: PELVIS - 1-2 VIEW COMPARISON:  None Available. FINDINGS: There is no evidence of pelvic fracture or diastasis. No pelvic bone lesions are seen. Mild degenerative changes are seen involving both hips in the form of joint space narrowing and acetabular sclerosis. IMPRESSION: Mild degenerative changes involving both hips. Electronically Signed   By: Suzen Dials M.D.   On: 07/05/2023 20:35   DG Knee Complete 4 Views Left Result Date: 07/05/2023 CLINICAL DATA:  Witnessed fall. EXAM: LEFT KNEE - COMPLETE 4+ VIEW COMPARISON:  None Available. FINDINGS: No evidence of an acute fracture, dislocation, or joint effusion. Medial marginal osteophytes are noted. There is moderate to marked severity tricompartmental joint space narrowing. Soft tissues are unremarkable. 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 Electronically Signed   By: Suzen Dials M.D.   On: 07/05/2023 20:33   DG Knee Complete 4 Views Right Result Date: 07/05/2023 CLINICAL DATA:  Status post fall. EXAM: RIGHT KNEE - COMPLETE 4+ VIEW COMPARISON:  None Available. FINDINGS: No evidence of an acute fracture, dislocation, or joint effusion. A small, chronic appearing cortical deformity versus medial marginal osteophyte is seen along the proximal right tibia. Moderate to marked severity tricompartmental joint space narrowing is seen. Soft tissues are unremarkable. 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 Electronically  Signed   By: Suzen Dials M.D.   On: 07/05/2023  20:31   DG Forearm Right Result Date: 07/05/2023 CLINICAL DATA:  Status post fall. EXAM: RIGHT FOREARM - 2 VIEW COMPARISON:  None Available. FINDINGS: There is no evidence of fracture or other focal bone lesions. Moderate to marked severity focal soft tissue swelling is seen along the dorsal aspect of the mid right forearm. IMPRESSION: Moderate to marked severity focal soft tissue swelling along the dorsal aspect of the mid right forearm. Electronically Signed   By: Suzen Dials M.D.   On: 07/05/2023 20:26    Alm Schneider, DO  [facility]  If 7PM-7AM, please contact night-coverage [email or web address] Password [email or web address] 08/03/2023, 4:22 PM   LOS: 10 days

## 2023-08-04 DIAGNOSIS — J690 Pneumonitis due to inhalation of food and vomit: Secondary | ICD-10-CM | POA: Diagnosis not present

## 2023-08-04 DIAGNOSIS — Z7189 Other specified counseling: Secondary | ICD-10-CM | POA: Diagnosis not present

## 2023-08-04 DIAGNOSIS — I4891 Unspecified atrial fibrillation: Secondary | ICD-10-CM | POA: Diagnosis not present

## 2023-08-04 DIAGNOSIS — Z515 Encounter for palliative care: Secondary | ICD-10-CM | POA: Diagnosis not present

## 2023-08-04 DIAGNOSIS — G9340 Encephalopathy, unspecified: Secondary | ICD-10-CM | POA: Diagnosis not present

## 2023-08-04 DIAGNOSIS — J101 Influenza due to other identified influenza virus with other respiratory manifestations: Secondary | ICD-10-CM | POA: Diagnosis not present

## 2023-08-04 LAB — BASIC METABOLIC PANEL
Anion gap: 7 (ref 5–15)
BUN: 15 mg/dL (ref 8–23)
CO2: 27 mmol/L (ref 22–32)
Calcium: 8.6 mg/dL — ABNORMAL LOW (ref 8.9–10.3)
Chloride: 105 mmol/L (ref 98–111)
Creatinine, Ser: 2.16 mg/dL — ABNORMAL HIGH (ref 0.44–1.00)
GFR, Estimated: 22 mL/min — ABNORMAL LOW (ref >60)
Glucose, Bld: 99 mg/dL (ref 70–99)
Potassium: 3.3 mmol/L — ABNORMAL LOW (ref 3.5–5.1)
Sodium: 139 mmol/L (ref 135–145)

## 2023-08-04 LAB — GLUCOSE, CAPILLARY
Glucose-Capillary: 112 mg/dL — ABNORMAL HIGH (ref 70–99)
Glucose-Capillary: 126 mg/dL — ABNORMAL HIGH (ref 70–99)
Glucose-Capillary: 80 mg/dL (ref 70–99)
Glucose-Capillary: 81 mg/dL (ref 70–99)
Glucose-Capillary: 96 mg/dL (ref 70–99)
Glucose-Capillary: 97 mg/dL (ref 70–99)

## 2023-08-04 LAB — MAGNESIUM: Magnesium: 1.9 mg/dL (ref 1.7–2.4)

## 2023-08-04 MED ORDER — POTASSIUM CHLORIDE 10 MEQ/100ML IV SOLN
10.0000 meq | INTRAVENOUS | Status: AC
  Administered 2023-08-04 (×2): 10 meq via INTRAVENOUS
  Filled 2023-08-04 (×2): qty 100

## 2023-08-04 MED ORDER — APIXABAN 2.5 MG PO TABS
2.5000 mg | ORAL_TABLET | Freq: Two times a day (BID) | ORAL | Status: DC
  Administered 2023-08-04 – 2023-08-06 (×2): 2.5 mg via ORAL
  Filled 2023-08-04 (×3): qty 1

## 2023-08-04 MED ORDER — METOPROLOL TARTRATE 25 MG PO TABS
25.0000 mg | ORAL_TABLET | Freq: Two times a day (BID) | ORAL | Status: DC
  Filled 2023-08-04: qty 1

## 2023-08-04 NOTE — Progress Notes (Addendum)
 PROGRESS NOTE  [name] FTD:[id number] DOB: 12/26/1940 DOA: 07/24/2023 PCP: Areta Kuba, MD (Inactive)  Brief History:  83 y.o. female, with a history of paroxysmal atrial fibrillation, diabetes mellitus type 2, hypertension, dysphagia, pulmonary embolus, hypothyroidism, and esophageal dysmotility, dementia, living at SNF for last 5 years, patient with advanced dementia at baseline, bedbound as discussed with family. -Patient was brought by ED secondary to altered mental status, increased lethargy, patient was noted to be altered since yesterday, apparently she is on antibiotics for UTI, as discussed with family, she is with advanced dementia, but usually communicative, remember some family members, bedbound at baseline.   -In ED she was altered, CT head with no acute findings, significant for chronic infarcts, CT abdomen pelvics with questionable right lower quadrant gas-fluid collection, likely within the bowels, but due to emotional artifact could not rule out abscess or perforated fluid collection, her respiratory panel significant for positive influenza A, [facility] hospitalist consulted to admit   Assessment/Plan: Acute metabolic encephalopathy -due to infectious process, including influenza -2/8 more awake but remains confused and intermittently agitated--very po po intake--essentially zero -CT head with no acute findings -Per reports patient was treated for UTI at facility prior to arrival  -palliative medicine consultation due to failure to thrive in setting of dementia -B12 1363 -folate 9.1 -TSH 2.332 -ammonia 14 -2/6 MR brain-no acute finding -VBG--7.42/55/32/36   Influenza infection -continue Tamiflu --completed course -1/30 CT chest--Mild bilateral lower lobe cylindrical bronchiectasis and associated mild parenchymal densities. -check PCT <0.10   Aspiration pneumonitis -personally reviewed CXR 2/5--RUL opacity, bibasilar atelectasis -continue  zosyn  -MRSA+   AKI -baseline creatinine 0.4-0.7 -serum creatinine peaked 1.63>>2.16 -d/c vanc -saline lock and monitor -am BMP   Hypoglycemia -due to poor po intake -started D5W>>switched to D5NS   Persistent A-fib with RVR - resolved  -On apixaban  for anticoagulation -currently off Cardizem  drip and back on oral cardizem  >>no on IV lopressor  -2/8 d/c po diltiazem  due to soft BP, continue IV lopressor  -recurrence of Afib RVR overnight 2/4, started on IV amio by night provider, cardiology consulted and now back on oral diltiazem  and off IV amiodarone    Questionable right lower quadrant fluid collection, likely just just fluid-gas most likely within the bowel -CT abdomen showing fluid and gas in the right lower quadrant of the abdomen, possibly within bowel, been difficult to confirm due to motion artifact , mentation to exclude possibility of abscess or contained perforation -Consulted general surgery - see consult notes  -advanced to clears diet per surgeon>>now tolerating regular diet -ok to resume apixaban  per surgeon -surgery signing off 2/2. Ok for diet; advanced to dys 1 on 2/2    History of PE -Resumed apixaban     Major neurocognitive disorder Deconditioning -Bedbound at baseline -Continue with supportive care   Hyperlipidemia -continue with statin   Hypothyroidism -Continue with Synthroid    Hypertension -Continue with lopressor     Right forearm fluid collection likely hematoma -Is not warm or tender, likely related to hematoma from recent fall, ED physician attempted to aspirate only small amount of blood came out   Hypokalemia -replete -mag 2.0   Goals of care -2/10--discussed with brother -2/10 brother confirms DNR, continue treat any reversible or treatable conditions; monitor CBGs and renal off of IVF--if worsens again will transition to comfort measures    Family Communication:   brother updated 2/10   Consultants:  palliative   Code Status:  DNR   DVT Prophylaxis:  apixaban      Procedures: As Listed in Progress Note Above   Antibiotics: Zosyn  2/5>>2/10 Vanc 2/6>>2/8         Subjective: Pt is awake but confused.  ROS not possible  Objective: Vitals:   08/04/23 0753 08/04/23 1219 08/04/23 1309 08/04/23 1501  BP:  (!) 126/104 (!) 103/90   Pulse:   88   Resp:   19   Temp:   97.6 F (36.4 C)   TempSrc:   Axillary   SpO2: 94%  99% 92%  Weight:      Height:        Intake/Output Summary (Last 24 hours) at 08/04/2023 1827 Last data filed at 08/04/2023 1700 Gross per 24 hour  Intake 1254.48 ml  Output --  Net 1254.48 ml   Weight change:  Exam:  General:  Pt is alert, does not follow commands appropriately, not in acute distress HEENT: No icterus, No thrush, No neck mass, [place]/AT Cardiovascular: RRR, S1/S2, no rubs, no gallops Respiratory: bibasilar rales.  No wheeze Abdomen: Soft/+BS, non tender, non distended, no guarding Extremities: No edema, No lymphangitis, No petechiae, No rashes, no synovitis   Data Reviewed: I have personally reviewed following labs and imaging studies Basic Metabolic Panel: Recent Labs  Lab 07/30/23 1008 07/31/23 0955 08/01/23 0505 08/02/23 0613 08/03/23 0406 08/04/23 0409  NA 143 143 143 141 137 139  K 3.3* 3.0* 3.5 2.8* 3.2* 3.3*  CL 102 103 103 104 101 105  CO2 29 31 32 28 28 27   GLUCOSE 80 70 66* 82 71 99  BUN 5* 7* 7* 10 13 15   CREATININE 0.47 0.62 0.61 0.83 1.63* 2.16*  CALCIUM  8.3* 8.4* 8.3* 8.4* 8.5* 8.6*  MG 2.0 1.8  --  1.6* 2.0 1.9   Liver Function Tests: Recent Labs  Lab 07/30/23 1008  AST 17  ALT 10  ALKPHOS 67  BILITOT 0.9  PROT 6.0*  ALBUMIN  2.7*   No results for input(s): "LIPASE", "AMYLASE" in the last 168 hours. Recent Labs  Lab 07/30/23 1008  AMMONIA 14   Coagulation Profile: No results for input(s): "INR", "PROTIME" in the last 168 hours. CBC: Recent Labs  Lab 07/30/23 1008 07/31/23 0955  WBC 10.4 7.3  HGB 11.7* 11.0*  HCT  39.3 37.1  MCV 88.3 88.1  PLT 216 250   Cardiac Enzymes: No results for input(s): "CKTOTAL", "CKMB", "CKMBINDEX", "TROPONINI" in the last 168 hours. BNP: Invalid input(s): "POCBNP" CBG: Recent Labs  Lab 08/03/23 2117 08/04/23 0548 08/04/23 0748 08/04/23 1129 08/04/23 1628  GLUCAP 82 112* 96 126* 97   HbA1C: No results for input(s): "HGBA1C" in the last 72 hours. Urine analysis:    Component Value Date/Time   COLORURINE YELLOW 07/05/2023 1934   APPEARANCEUR CLOUDY (A) 07/05/2023 1934   LABSPEC 1.018 07/05/2023 1934   PHURINE 6.0 07/05/2023 1934   GLUCOSEU NEGATIVE 07/05/2023 1934   HGBUR NEGATIVE 07/05/2023 1934   BILIRUBINUR NEGATIVE 07/05/2023 1934   KETONESUR 5 (A) 07/05/2023 1934   PROTEINUR NEGATIVE 07/05/2023 1934   UROBILINOGEN 0.2 05/08/2014 2240   NITRITE NEGATIVE 07/05/2023 1934   LEUKOCYTESUR LARGE (A) 07/05/2023 1934   Sepsis Labs: @LABRCNTIP (procalcitonin:4,lacticidven:4) )No results found for this or any previous visit (from the past 240 hours).   Scheduled Meds:  apixaban   2.5 mg Oral BID   bisacodyl   10 mg Rectal Daily   budesonide  (PULMICORT ) nebulizer solution  0.5 mg Nebulization BID   Chlorhexidine  Gluconate Cloth  6 each Topical Daily   feeding  supplement  237 mL Oral BID BM   ipratropium  0.5 mg Nebulization TID   levalbuterol   0.63 mg Nebulization TID   levothyroxine   50 mcg Oral QAC breakfast   metoprolol  tartrate  25 mg Oral BID   pantoprazole  (PROTONIX ) IV  40 mg Intravenous Q24H   sodium chloride  flush  10-40 mL Intracatheter Q12H   sodium chloride  flush  10-40 mL Intracatheter Q12H   sodium chloride  flush  10-40 mL Intracatheter Q12H   Continuous Infusions:  piperacillin -tazobactam (ZOSYN )  IV 3.375 g (08/04/23 1800)   potassium chloride  10 mEq (08/04/23 1805)    Procedures/Studies: MR BRAIN WO CONTRAST Result Date: 07/31/2023 CLINICAL DATA:  Mental status change, unknown cause. EXAM: MRI HEAD WITHOUT CONTRAST TECHNIQUE:  Multiplanar, multiecho pulse sequences of the brain and surrounding structures were obtained without intravenous contrast. COMPARISON:  Head CT July 24, 2023. FINDINGS: The study is significantly degraded by motion. Brain: No acute infarction, hemorrhage, hydrocephalus, extra-axial collection or mass lesion. Remote right PCA territory infarct. At least mild to moderate chronic ischemic changes of the white matter. Moderate parenchymal volume loss. Vascular: Normal flow voids. Skull and upper cervical spine: No focal lesion identified. Sinuses/Orbits: Mucosal thickening of the paranasal sinuses with fluid level within the sphenoid sinuses. Other: None. IMPRESSION: 1. Significantly motion degraded study. 2. No acute intracranial abnormality identified. 3. Remote right PCA territory infarct. 4. At least mild to moderate chronic ischemic changes of the white matter. 5. Moderate parenchymal volume loss. 6. Paranasal sinus disease. Electronically Signed   By: Katyucia  de Macedo Rodrigues M.D.   On: 07/31/2023 12:31   DG CHEST PORT 1 VIEW Result Date: 07/30/2023 CLINICAL DATA:  Altered mental status. Shortness of breath. Lethargy. EXAM: PORTABLE CHEST 1 VIEW COMPARISON:  Chest radiograph dated 07/25/2023. FINDINGS: Right-sided PICC in similar position. Left lung base atelectasis. Right upper lobe streaky densities may represent infiltrate. Small bilateral pleural effusions suspected. No pneumothorax. Stable cardiac silhouette. Atherosclerotic calcification of the aorta. No acute osseous pathology. IMPRESSION: 1. Left lung base atelectasis. 2. Right upper lobe infiltrate. 3. Small bilateral pleural effusions. Electronically Signed   By: Angus Bark M.D.   On: 07/30/2023 14:15   ECHOCARDIOGRAM COMPLETE Result Date: 07/29/2023    ECHOCARDIOGRAM REPORT   Patient Name:   [name] Date of Exam: 07/29/2023 Medical Rec #:  [id number]      Height:       66.0 in Accession #:    [id number]     Weight:       199.1 lb  Date of Birth:  1941/01/21       BSA:          1.996 m Patient Age:    82 years       BP:           147/104 mmHg Patient Gender: F              HR:           120 bpm. Exam Location:  Inpatient Procedure: 2D Echo, Cardiac Doppler and Color Doppler Indications:    A-fib  History:        Patient has prior history of Echocardiogram examinations, most                 recent 12/24/2017. CKD, Arrythmias:Atrial Fibrillation; Risk                 Factors:Hypertension, Diabetes, Dyslipidemia and Former Smoker.  Sonographer:    Adelia Homestead  RVT Referring Phys: [id number] JAN A MANSY  Sonographer Comments: Technically challenging study due to limited acoustic windows and Technically difficult study due to poor echo windows. Technically difficult due to elevated HR, patient supine, respiratory motion and patients inability to tolerate exam. HR between 120 bpm-140 bpm IMPRESSIONS  1. Left ventricular ejection fraction, by estimation, is 55 to 60%. The left ventricle has normal function. Left ventricular endocardial border not optimally defined to evaluate regional wall motion. There is mild left ventricular hypertrophy. Left ventricular diastolic parameters are indeterminate.  2. RV not well visualized, grossly appears normal in size and function. . Right ventricular systolic function was not well visualized. The right ventricular size is not well visualized. Tricuspid regurgitation signal is inadequate for assessing PA pressure.  3. The mitral valve was not well visualized. Trivial mitral valve regurgitation. No evidence of mitral stenosis.  4. The aortic valve was not well visualized. There is mild calcification of the aortic valve. There is mild thickening of the aortic valve. Aortic valve regurgitation is not visualized. No aortic stenosis is present.  5. The inferior vena cava is normal in size with greater than 50% respiratory variability, suggesting right atrial pressure of 3 mmHg.  6. Technically difficult study in setting of  limited visualization and elevated heart rates. FINDINGS  Left Ventricle: Left ventricular ejection fraction, by estimation, is 55 to 60%. The left ventricle has normal function. Left ventricular endocardial border not optimally defined to evaluate regional wall motion. The left ventricular internal cavity size was normal in size. There is mild left ventricular hypertrophy. Left ventricular diastolic parameters are indeterminate. Right Ventricle: RV not well visualized, grossly appears normal in size and function. The right ventricular size is not well visualized. Right vetricular wall thickness was not well visualized. Right ventricular systolic function was not well visualized.  Tricuspid regurgitation signal is inadequate for assessing PA pressure. Left Atrium: Left atrial size was normal in size. Right Atrium: Right atrial size was normal in size. Pericardium: There is no evidence of pericardial effusion. Mitral Valve: The mitral valve was not well visualized. Trivial mitral valve regurgitation. No evidence of mitral valve stenosis. Tricuspid Valve: The tricuspid valve is not well visualized. Tricuspid valve regurgitation is trivial. No evidence of tricuspid stenosis. Aortic Valve: The aortic valve was not well visualized. There is mild calcification of the aortic valve. There is mild thickening of the aortic valve. There is mild aortic valve annular calcification. Aortic valve regurgitation is not visualized. No aortic stenosis is present. Aortic valve mean gradient measures 2.8 mmHg. Aortic valve peak gradient measures 5.3 mmHg. Aortic valve area, by VTI measures 2.17 cm. Pulmonic Valve: The pulmonic valve was not well visualized. Pulmonic valve regurgitation is not visualized. No evidence of pulmonic stenosis. Aorta: The aortic root and ascending aorta are structurally normal, with no evidence of dilitation. Venous: The inferior vena cava is normal in size with greater than 50% respiratory variability,  suggesting right atrial pressure of 3 mmHg. IAS/Shunts: No atrial level shunt detected by color flow Doppler.  LEFT VENTRICLE PLAX 2D LVIDd:         4.10 cm LVIDs:         2.80 cm LV PW:         1.20 cm LV IVS:        1.20 cm LVOT diam:     2.00 cm LV SV:         32 LV SV Index:   16 LVOT Area:  3.14 cm  IVC IVC diam: 1.80 cm LEFT ATRIUM           Index LA diam:      3.70 cm 1.85 cm/m LA Vol (A4C): 34.9 ml 17.49 ml/m  AORTIC VALVE                    PULMONIC VALVE AV Area (Vmax):    2.30 cm     PV Vmax:       0.88 m/s AV Area (Vmean):   2.18 cm     PV Peak grad:  3.1 mmHg AV Area (VTI):     2.17 cm AV Vmax:           114.60 cm/s AV Vmean:          79.140 cm/s AV VTI:            0.148 m AV Peak Grad:      5.3 mmHg AV Mean Grad:      2.8 mmHg LVOT Vmax:         83.87 cm/s LVOT Vmean:        54.867 cm/s LVOT VTI:          0.102 m LVOT/AV VTI ratio: 0.69  AORTA Ao Root diam: 2.20 cm Ao Asc diam:  3.40 cm  SHUNTS Systemic VTI:  0.10 m Systemic Diam: 2.00 cm Armida Lander MD Electronically signed by Armida Lander MD Signature Date/Time: 07/29/2023/12:24:49 PM    Final    DG Chest Portable 1 View Result Date: 07/25/2023 CLINICAL DATA:  PICC line placement EXAM: PORTABLE CHEST 1 VIEW COMPARISON:  Chest radiograph and CT chest, abdomen, and pelvis dated 07/24/2023. FINDINGS: Right PICC line tip overlies the lower SVC. Low lung volumes. Stable cardiomegaly. Aortic atherosclerosis. Similar indistinctness of the left costophrenic angle, likely correlates with prominent subpleural fat noted on the prior CT. No focal consolidation, sizeable pleural effusion, or pneumothorax. The visualized osseous structures are unchanged. IMPRESSION: 1. Right PICC line tip overlies the lower SVC. 2. Low lung volumes.  No acute cardiopulmonary findings. Electronically Signed   By: Mannie Seek M.D.   On: 07/25/2023 13:11   US  EKG SITE RITE Result Date: 07/25/2023 If Site Rite image not attached, placement could not be  confirmed due to current cardiac rhythm.  CT CHEST ABDOMEN PELVIS W CONTRAST Result Date: 07/24/2023 CLINICAL DATA:  Sepsis. Urinary tract infection. Previous appendectomy, hysterectomy, oophorectomy and cholecystectomy. EXAM: CT CHEST, ABDOMEN, AND PELVIS WITH CONTRAST TECHNIQUE: Multidetector CT imaging of the chest, abdomen and pelvis was performed following the standard protocol during bolus administration of intravenous contrast. RADIATION DOSE REDUCTION: This exam was performed according to the departmental dose-optimization program which includes automated exposure control, adjustment of the mA and/or kV according to patient size and/or use of iterative reconstruction technique. CONTRAST:  OMNIPAQUE  IOHEXOL  300 MG/ML  SOLN COMPARISON:  Abdomen and pelvis CT dated 04/24/2018. Portable chest obtained earlier today. Chest CT report dated 05/15/2019. Chest CTA dated 10/18/2008. FINDINGS: CT CHEST FINDINGS Cardiovascular: Stable mildly enlarged heart aortic valve calcifications and dense mitral valve annulus calcifications. Atheromatous calcifications, including the coronary arteries and aorta. The central pulmonary arteries remain mildly dilated with a main pulmonary artery diameter of 3.4 cm. Mediastinum/Nodes: No enlarged mediastinal, hilar, or axillary lymph nodes. Interval flattening of the trachea and mainstem bronchi with an interval decreased depth of inspiration. Unremarkable thyroid  gland and esophagus. Lungs/Pleura: Decreased depth of inspiration. Mild bilateral lower lobe cylindrical bronchiectasis and associated mild parenchymal densities.  Prominent subpleural fat on the left causing the blunting of the lateral costophrenic angle seen radiographically. No pleural fluid Musculoskeletal: Thoracic spine degenerative changes multiple thoracic vertebral compression deformities with mild bony retropulsion and no visible acute fracture lines. Associated acute kyphosis in the upper thoracic spine. CT  ABDOMEN PELVIS FINDINGS Hepatobiliary: No focal liver abnormality is seen. Status post cholecystectomy. No biliary dilatation. Pancreas: Unremarkable. No pancreatic ductal dilatation or surrounding inflammatory changes. Spleen: Normal in size without focal abnormality. Adrenals/Urinary Tract: Normal-appearing adrenal glands multiple bilateral simple appearing renal cysts. These do not need imaging follow-up unremarkable urinary bladder and ureters Stomach/Bowel: Large number of sigmoid colon diverticula without evidence of diverticulitis. Breathing motion blurring obscuring the detail in the right lower quadrant of the abdomen. There is some fluid in gas in that region of the abdomen that is most likely intraluminal. However, due to the motion artifact, it is difficult to exclude a fluid or gas collection in that area. Surgically absent appendix by history. Unremarkable stomach. Vascular/Lymphatic: Atheromatous arterial calcifications without aneurysm. No enlarged lymph nodes. Reproductive: Status post hysterectomy. No adnexal masses. Other: Anterior subcutaneous edema and anterior subcutaneous air, compatible with recent subcutaneous injection. Moderate-sized left inguinal hernia containing fat and small to moderate-sized right inguinal hernia containing fat Musculoskeletal: Lumbar spine degenerative changes. Multiple lumbar spine vertebral compression deformities that are new since 04/24/2018. Stable old T12 vertebral compression deformity. Possible acute fracture line in the anterior, superior corner of the L2 vertebral body and possible acute fracture line in the superior endplate of the L4 vertebral body. IMPRESSION: 1. Fluid and gas in the right lower quadrant of the abdomen which is most likely within the bowel. However, this can not be confirmed as intraluminal due to motion artifacts in that area. Correlation with the presence or absence of focal pain and tenderness in that area is recommended to help  exclude the possibility of an abscess or contained perforation in the right lower quadrant of the abdomen. 2. Otherwise, no acute abnormality seen. 3. Decreased depth of inspiration with flattening of the trachea and mainstem bronchi, compatible with tracheobronchomalacia. 4. Mild bilateral lower lobe cylindrical bronchiectasis and associated mild parenchymal densities, most likely due to atelectasis. 5. Stable mild cardiomegaly. 6. Mildly dilated central pulmonary arteries, suggesting pulmonary arterial hypertension. 7. Multiple thoracic and lumbar spine vertebral compression deformities, some of which are new since 04/24/2018. Possible acute fracture lines in the anterior, superior corner of the L2 vertebral body and superior endplate of the L4 vertebral body. 8. Sigmoid colon diverticulosis. 9. Moderate-sized left inguinal hernia containing fat and small to moderate-sized right inguinal hernia containing fat. 10. Aortic atherosclerosis. Aortic Atherosclerosis (ICD10-I70.0). Electronically Signed   By: Catherin Closs M.D.   On: 07/24/2023 16:45   CT Head Wo Contrast Result Date: 07/24/2023 CLINICAL DATA:  Mental status change. Current therapy for urinary tract infection. EXAM: CT HEAD WITHOUT CONTRAST TECHNIQUE: Contiguous axial images were obtained from the base of the skull through the vertex without intravenous contrast. RADIATION DOSE REDUCTION: This exam was performed according to the departmental dose-optimization program which includes automated exposure control, adjustment of the mA and/or kV according to patient size and/or use of iterative reconstruction technique. COMPARISON:  01/05/2023 FINDINGS: Brain: No evidence of acute infarction, hemorrhage, hydrocephalus, extra-axial collection or mass lesion/mass effect. Vascular: Encephalomalacia within the right occipital and posterior temporal lobes consistent with remote PCA infarct. Ex vacuo dilatation of the right posterolateral ventricle noted. There is  mild diffuse low-attenuation within the subcortical and periventricular  white matter compatible with chronic microvascular disease. Skull: Normal. Negative for fracture or focal lesion. Sinuses/Orbits: Air-fluid level within the sphenoid sinus. Partial opacification of the ethmoid air cells. Other: None. IMPRESSION: 1. No acute intracranial abnormalities. 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 4. Air-fluid level within the sphenoid sinus. Correlate for any clinical signs or symptoms of acute sinusitis. Electronically Signed   By: Kimberley Penman M.D.   On: 07/24/2023 16:15   DG Chest Port 1 View Result Date: 07/24/2023 CLINICAL DATA:  Questionable sepsis - evaluate for abnormality. Weakness. Altered mental status. EXAM: PORTABLE CHEST 1 VIEW COMPARISON:  01/05/2023. FINDINGS: Redemonstration of blunting of left lateral costophrenic angle, similar to the prior study, which may represent small left pleural effusion. There are probable associated atelectatic changes at the left lung base. Bilateral lung fields are otherwise clear. No acute consolidation or lung collapse. Right lateral costophrenic angle is clear. Stable cardio-mediastinal silhouette. No acute osseous abnormalities. The soft tissues are within normal limits. There are surgical clips in the right upper quadrant, typical of a previous cholecystectomy. IMPRESSION: *Persistent blunting of left lateral costophrenic angle, which may represent small left pleural effusion. *Otherwise no acute cardiopulmonary abnormality seen. Electronically Signed   By: Beula Brunswick M.D.   On: 07/24/2023 14:16   DG Pelvis 1-2 Views Result Date: 07/05/2023 CLINICAL DATA:  Status post fall. EXAM: PELVIS - 1-2 VIEW COMPARISON:  None Available. FINDINGS: There is no evidence of pelvic fracture or diastasis. No pelvic bone lesions are seen. Mild degenerative changes are seen involving both hips in the form of joint space narrowing and acetabular  sclerosis. IMPRESSION: Mild degenerative changes involving both hips. Electronically Signed   By: Virgle Grime M.D.   On: 07/05/2023 20:35   DG Knee Complete 4 Views Left Result Date: 07/05/2023 CLINICAL DATA:  Witnessed fall. EXAM: LEFT KNEE - COMPLETE 4+ VIEW COMPARISON:  None Available. FINDINGS: No evidence of an acute fracture, dislocation, or joint effusion. Medial marginal osteophytes are noted. There is moderate to marked severity tricompartmental joint space narrowing. Soft tissues are unremarkable. 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 Electronically Signed   By: Virgle Grime M.D.   On: 07/05/2023 20:33   DG Knee Complete 4 Views Right Result Date: 07/05/2023 CLINICAL DATA:  Status post fall. EXAM: RIGHT KNEE - COMPLETE 4+ VIEW COMPARISON:  None Available. FINDINGS: No evidence of an acute fracture, dislocation, or joint effusion. A small, chronic appearing cortical deformity versus medial marginal osteophyte is seen along the proximal right tibia. Moderate to marked severity tricompartmental joint space narrowing is seen. Soft tissues are unremarkable. 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 Electronically Signed   By: Virgle Grime M.D.   On: 07/05/2023 20:31   DG Forearm Right Result Date: 07/05/2023 CLINICAL DATA:  Status post fall. EXAM: RIGHT FOREARM - 2 VIEW COMPARISON:  None Available. FINDINGS: There is no evidence of fracture or other focal bone lesions. Moderate to marked severity focal soft tissue swelling is seen along the dorsal aspect of the mid right forearm. IMPRESSION: Moderate to marked severity focal soft tissue swelling along the dorsal aspect of the mid right forearm. Electronically Signed   By: Virgle Grime M.D.   On: 07/05/2023 20:26    Demaris Fillers, DO  [facility]  If 7PM-7AM, please contact night-coverage [email or web address] Password [email or web address] 08/04/2023, 6:27 PM   LOS:  11 days

## 2023-08-04 NOTE — Progress Notes (Signed)
 Palliative: Mrs. Armagost is lying quietly in bed.  She appears acutely/chronically ill and frail, obese.  She is resting comfortably and does not open her eyes when I shake her arm or call her name.  She clearly cannot make her basic needs known.  Her brother, Arnoldo Lapping, is present at bedside.  Face-to-face discussion with attending and bedside nursing staff related to patient condition, needs.  Arnoldo Lapping and I talk about Ms. Moorhouse's acute health concerns.  He tells me that he feels she "looks better".  He is excited because he "got some [facility] in her".  It seems that he may have gotten Brittley to drink, at most, a few ounces.  I ask if he feels that Brin could return to long-term care in her current state.  He states that he understands that she must have more intake in order to discharge.  Berta Brittle tells me that he spoke with the hospitalist today and the plan is for stopping IV fluids, 24 hours more for outcomes.  He shares that he did discuss hospice care with hospitalist.  I share that PMT will follow.  Conference with attending, bedside nursing staff, transition of care team related to patient condition, needs, goals of care, disposition.  Plan: Continue to treat the treatable but no CPR or intubation.  Plan is to stop IV fluids.  24 hours more for outcomes.  50 minutes Khai Torbert, NP Palliative medicine team Team phone [phone]

## 2023-08-05 ENCOUNTER — Inpatient Hospital Stay (HOSPITAL_COMMUNITY): Payer: Medicare (Managed Care)

## 2023-08-05 DIAGNOSIS — I4891 Unspecified atrial fibrillation: Secondary | ICD-10-CM | POA: Diagnosis not present

## 2023-08-05 DIAGNOSIS — Z7189 Other specified counseling: Secondary | ICD-10-CM | POA: Diagnosis not present

## 2023-08-05 DIAGNOSIS — Z515 Encounter for palliative care: Secondary | ICD-10-CM | POA: Diagnosis not present

## 2023-08-05 DIAGNOSIS — J101 Influenza due to other identified influenza virus with other respiratory manifestations: Secondary | ICD-10-CM | POA: Diagnosis not present

## 2023-08-05 LAB — GLUCOSE, CAPILLARY
Glucose-Capillary: 85 mg/dL (ref 70–99)
Glucose-Capillary: 83 mg/dL (ref 70–99)
Glucose-Capillary: 80 mg/dL (ref 70–99)
Glucose-Capillary: 78 mg/dL (ref 70–99)
Glucose-Capillary: 70 mg/dL (ref 70–99)

## 2023-08-05 LAB — BASIC METABOLIC PANEL
Anion gap: 8 (ref 5–15)
BUN: 16 mg/dL (ref 8–23)
CO2: 25 mmol/L (ref 22–32)
Calcium: 8.6 mg/dL — ABNORMAL LOW (ref 8.9–10.3)
Chloride: 107 mmol/L (ref 98–111)
Creatinine, Ser: 2.63 mg/dL — ABNORMAL HIGH (ref 0.44–1.00)
GFR, Estimated: 17 mL/min — ABNORMAL LOW (ref >60)
Glucose, Bld: 80 mg/dL (ref 70–99)
Potassium: 3.6 mmol/L (ref 3.5–5.1)
Sodium: 140 mmol/L (ref 135–145)

## 2023-08-05 LAB — URINALYSIS, ROUTINE W REFLEX MICROSCOPIC
Bilirubin Urine: NEGATIVE
Glucose, UA: NEGATIVE mg/dL
Ketones, ur: NEGATIVE mg/dL
Nitrite: NEGATIVE
Protein, ur: NEGATIVE mg/dL
RBC / HPF: >50 RBC/hpf (ref 0–5)
Specific Gravity, Urine: 1.009 (ref 1.005–1.030)
pH: 5 (ref 5.0–8.0)

## 2023-08-05 LAB — VANCOMYCIN, RANDOM: Vancomycin Rm: 20 ug/mL

## 2023-08-05 LAB — MAGNESIUM: Magnesium: 1.9 mg/dL (ref 1.7–2.4)

## 2023-08-05 MED ORDER — DEXTROSE-SODIUM CHLORIDE 5-0.9 % IV SOLN: INTRAVENOUS | Status: DC

## 2023-08-05 MED ORDER — METOPROLOL TARTRATE 5 MG/5ML IV SOLN
2.5000 mg | Freq: Four times a day (QID) | INTRAVENOUS | Status: DC
Start: 2023-08-05 — End: 2023-08-06
  Administered 2023-08-05 – 2023-08-06 (×4): 2.5 mg via INTRAVENOUS
  Filled 2023-08-05 (×4): qty 5

## 2023-08-05 NOTE — Progress Notes (Signed)
Patient refusing PO meds at this time. Dr. Thomes Dinning made aware

## 2023-08-05 NOTE — Progress Notes (Addendum)
PROGRESS NOTE  [name] ZOX:[id number] DOB: 12-Dec-1940 DOA: 07/24/2023 PCP: Merlyn Albert, MD (Inactive)  Brief History:  83 y.o. female, with a history of paroxysmal atrial fibrillation, diabetes mellitus type 2, hypertension, dysphagia, pulmonary embolus, hypothyroidism, and esophageal dysmotility, dementia, living at SNF for last 5 years, patient with advanced dementia at baseline, bedbound as discussed with family. -Patient was brought by ED secondary to altered mental status, increased lethargy, patient was noted to be altered since yesterday, apparently she is on antibiotics for UTI, as discussed with family, she is with advanced dementia, but usually communicative, remember some family members, bedbound at baseline.   -In ED she was altered, CT head with no acute findings, significant for chronic infarcts, CT abdomen pelvics with questionable right lower quadrant gas-fluid collection, likely within the bowels, but due to emotional artifact could not rule out abscess or perforated fluid collection, her respiratory panel significant for positive influenza A, [facility] hospitalist consulted to admit   Assessment/Plan: Acute metabolic encephalopathy -due to infectious process, including influenza -2/11 more awake but remains confused and intermittently agitated--very po po intake--essentially zero -CT head with no acute findings -Per reports patient was treated for UTI at facility prior to arrival  -palliative medicine consultation due to failure to thrive in setting of dementia -B12 1363 -folate 9.1 -TSH 2.332 -ammonia 14 -2/6 MR brain-no acute finding -VBG--7.42/55/32/36  AKI -likely due to vanc and zosyn -renal US--no hydronephrosis -nephrology consult appreciated -baseline creatinine 0.4-0.7 -serum creatinine peaked 1.63>>2.16>>2.63 -not a candidate for dialysis -monitor BMP   Influenza infection -continue Tamiflu--completed course -1/30 CT chest--Mild  bilateral lower lobe cylindrical bronchiectasis and associated mild parenchymal densities. -check PCT <0.10   Aspiration pneumonitis -personally reviewed CXR 2/5--RUL opacity, bibasilar atelectasis -finished zosyn--had 6 days -MRSA+   Hypoglycemia -due to poor po intake -started D5W>>switched to D5NS -discussed with brother--d/c D5W--if recurrent hypoglycemia would contribute to GOC discussion   Persistent A-fib with RVR - resolved  -On apixaban for anticoagulation -currently off Cardizem drip and back on oral cardizem >>no on IV lopressor -2/8 d/c po diltiazem due to soft BP, continue IV lopressor -recurrence of Afib RVR overnight 2/4, started on IV amio by night provider, cardiology consulted and now back on oral diltiazem and off IV amiodarone -pt refuses to take po meds>>IV lopressor for now>>contributes to GOC discussion   Questionable right lower quadrant fluid collection, likely just just fluid-gas most likely within the bowel -CT abdomen showing fluid and gas in the right lower quadrant of the abdomen, possibly within bowel, been difficult to confirm due to motion artifact , mentation to exclude possibility of abscess or contained perforation -Consulted general surgery - see consult notes  -advanced to clears diet per surgeon>>now tolerating regular diet -ok to resume apixaban per surgeon -surgery signing off 2/2. Ok for diet; advanced to dys 1 on 2/2    History of PE -Resumed apixaban-refusing meds   Major neurocognitive disorder Deconditioning -Bedbound at baseline -Continue with supportive care   Hyperlipidemia -continue with statin   Hypothyroidism -Continue with Synthroid   Hypertension -Continue with lopressor IV   Right forearm fluid collection likely hematoma -Is not warm or tender, likely related to hematoma from recent fall, ED physician attempted to aspirate only small amount of blood came out   Hypokalemia -replete -mag 2.0   Goals of  care -2/10--discussed with brother -2/10 brother confirms DNR, continue treat any reversible or treatable conditions; monitor CBGs and  renal off of IVF--if worsens again will transition to comfort measures     Family Communication:   brother updated 2/11   Consultants:  palliative   Code Status: DNR   DVT Prophylaxis:  apixaban     Procedures: As Listed in Progress Note Above   Antibiotics: Zosyn 2/5>>2/11 Vanc 2/6>>2/8         Subjective: ROS not possible.  Pt not answering questions.  Awake, no distress  Objective: Vitals:   08/04/23 2050 08/05/23 0433 08/05/23 0846 08/05/23 1352  BP:  133/78  (!) 140/108  Pulse:  99  (!) 102  Resp:  20  19  Temp:  97.6 F (36.4 C)  97.6 F (36.4 C)  TempSrc:    Oral  SpO2: 95% 98% 95% 94%  Weight:      Height:        Intake/Output Summary (Last 24 hours) at 08/05/2023 1650 Last data filed at 08/05/2023 1350 Gross per 24 hour  Intake 0 ml  Output 850 ml  Net -850 ml   Weight change:  Exam:  General:  Pt is alert, follows commands appropriately, not in acute distress HEENT: No icterus, No thrush, No neck mass, [place]/AT Cardiovascular: IRRR, S1/S2, no rubs, no gallops Respiratory: bibasilar rales. No wheeze Abdomen: Soft/+BS, non tender, non distended, no guarding Extremities: No edema, No lymphangitis, No petechiae, No rashes, no synovitis   Data Reviewed: I have personally reviewed following labs and imaging studies Basic Metabolic Panel: Recent Labs  Lab 07/31/23 0955 08/01/23 0505 08/02/23 0613 08/03/23 0406 08/04/23 0409 08/05/23 0442  NA 143 143 141 137 139 140  K 3.0* 3.5 2.8* 3.2* 3.3* 3.6  CL 103 103 104 101 105 107  CO2 31 32 28 28 27 25   GLUCOSE 70 66* 82 71 99 80  BUN 7* 7* 10 13 15 16   CREATININE 0.62 0.61 0.83 1.63* 2.16* 2.63*  CALCIUM 8.4* 8.3* 8.4* 8.5* 8.6* 8.6*  MG 1.8  --  1.6* 2.0 1.9 1.9   Liver Function Tests: Recent Labs  Lab 07/30/23 1008  AST 17  ALT 10  ALKPHOS 67   BILITOT 0.9  PROT 6.0*  ALBUMIN 2.7*   No results for input(s): "LIPASE", "AMYLASE" in the last 168 hours. Recent Labs  Lab 07/30/23 1008  AMMONIA 14   Coagulation Profile: No results for input(s): "INR", "PROTIME" in the last 168 hours. CBC: Recent Labs  Lab 07/30/23 1008 07/31/23 0955  WBC 10.4 7.3  HGB 11.7* 11.0*  HCT 39.3 37.1  MCV 88.3 88.1  PLT 216 250   Cardiac Enzymes: No results for input(s): "CKTOTAL", "CKMB", "CKMBINDEX", "TROPONINI" in the last 168 hours. BNP: Invalid input(s): "POCBNP" CBG: Recent Labs  Lab 08/04/23 2349 08/05/23 0434 08/05/23 0739 08/05/23 1131 08/05/23 1611  GLUCAP 81 85 83 80 78   HbA1C: No results for input(s): "HGBA1C" in the last 72 hours. Urine analysis:    Component Value Date/Time   COLORURINE YELLOW 07/05/2023 1934   APPEARANCEUR CLOUDY (A) 07/05/2023 1934   LABSPEC 1.018 07/05/2023 1934   PHURINE 6.0 07/05/2023 1934   GLUCOSEU NEGATIVE 07/05/2023 1934   HGBUR NEGATIVE 07/05/2023 1934   BILIRUBINUR NEGATIVE 07/05/2023 1934   KETONESUR 5 (A) 07/05/2023 1934   PROTEINUR NEGATIVE 07/05/2023 1934   UROBILINOGEN 0.2 05/08/2014 2240   NITRITE NEGATIVE 07/05/2023 1934   LEUKOCYTESUR LARGE (A) 07/05/2023 1934   Sepsis Labs: @LABRCNTIP (procalcitonin:4,lacticidven:4) )No results found for this or any previous visit (from the past 240 hours).  Scheduled Meds:  apixaban  2.5 mg Oral BID   bisacodyl  10 mg Rectal Daily   budesonide (PULMICORT) nebulizer solution  0.5 mg Nebulization BID   Chlorhexidine Gluconate Cloth  6 each Topical Daily   feeding supplement  237 mL Oral BID BM   ipratropium  0.5 mg Nebulization TID   levalbuterol  0.63 mg Nebulization TID   levothyroxine  50 mcg Oral QAC breakfast   metoprolol tartrate  2.5 mg Intravenous Q6H   pantoprazole (PROTONIX) IV  40 mg Intravenous Q24H   sodium chloride flush  10-40 mL Intracatheter Q12H   Continuous Infusions:  Procedures/Studies: US RENAL Result  Date: 08/05/2023 CLINICAL DATA:  Acute kidney injury EXAM: RENAL / URINARY TRACT ULTRASOUND COMPLETE COMPARISON:  None Available. FINDINGS: Right Kidney: Renal measurements: 12.3 x 5.5 x 4.8 cm = volume: 172.5 mL. No collecting system dilatation or perinephric fluid. There is exophytic anechoic structure measuring 6.8 cm. Margins are somewhat ill-defined but there is overlapping bowel gas and soft tissue. These are more simple on the recent CT scan of 07/24/2023. Left Kidney: Renal measurements: 10.4 x 6.1 x 4.8 cm = volume: 159 mL. No collecting system dilatation or perinephric fluid. There is an echogenic shadowing area in the left kidney measuring 12 mm. Possible stone there is also some small cystic foci identified but these are mildly complex measuring up to 3.1 cm. Again this could be technical with the overlapping bowel gas and soft tissue. These appeared more simple on the recent CT scan of 07/24/2023. Bladder: Appears normal for degree of bladder distention. Other: None. IMPRESSION: No collecting system dilatation. Left-sided renal stone. Bilateral renal cystic foci are incompletely evaluated with the overlapping bowel gas and soft tissue but appeared more simple on the recent CT scan. Please correlate with prior CT evaluation. Electronically Signed   By: Karen Kays M.D.   On: 08/05/2023 14:36   MR BRAIN WO CONTRAST Result Date: 07/31/2023 CLINICAL DATA:  Mental status change, unknown cause. EXAM: MRI HEAD WITHOUT CONTRAST TECHNIQUE: Multiplanar, multiecho pulse sequences of the brain and surrounding structures were obtained without intravenous contrast. COMPARISON:  Head CT July 24, 2023. FINDINGS: The study is significantly degraded by motion. Brain: No acute infarction, hemorrhage, hydrocephalus, extra-axial collection or mass lesion. Remote right PCA territory infarct. At least mild to moderate chronic ischemic changes of the white matter. Moderate parenchymal volume loss. Vascular: Normal flow  voids. Skull and upper cervical spine: No focal lesion identified. Sinuses/Orbits: Mucosal thickening of the paranasal sinuses with fluid level within the sphenoid sinuses. Other: None. IMPRESSION: 1. Significantly motion degraded study. 2. No acute intracranial abnormality identified. 3. Remote right PCA territory infarct. 4. At least mild to moderate chronic ischemic changes of the white matter. 5. Moderate parenchymal volume loss. 6. Paranasal sinus disease. Electronically Signed   By: Baldemar Lenis M.D.   On: 07/31/2023 12:31   DG CHEST PORT 1 VIEW Result Date: 07/30/2023 CLINICAL DATA:  Altered mental status. Shortness of breath. Lethargy. EXAM: PORTABLE CHEST 1 VIEW COMPARISON:  Chest radiograph dated 07/25/2023. FINDINGS: Right-sided PICC in similar position. Left lung base atelectasis. Right upper lobe streaky densities may represent infiltrate. Small bilateral pleural effusions suspected. No pneumothorax. Stable cardiac silhouette. Atherosclerotic calcification of the aorta. No acute osseous pathology. IMPRESSION: 1. Left lung base atelectasis. 2. Right upper lobe infiltrate. 3. Small bilateral pleural effusions. Electronically Signed   By: Elgie Collard M.D.   On: 07/30/2023 14:15   ECHOCARDIOGRAM COMPLETE Result Date:  07/29/2023    ECHOCARDIOGRAM REPORT   Patient Name:   [name] Date of Exam: 07/29/2023 Medical Rec #:  [id number]      Height:       66.0 in Accession #:    [id number]     Weight:       199.1 lb Date of Birth:  May 08, 1941       BSA:          1.996 m Patient Age:    82 years       BP:           147/104 mmHg Patient Gender: F              HR:           120 bpm. Exam Location:  Inpatient Procedure: 2D Echo, Cardiac Doppler and Color Doppler Indications:    A-fib  History:        Patient has prior history of Echocardiogram examinations, most                 recent 12/24/2017. CKD, Arrythmias:Atrial Fibrillation; Risk                 Factors:Hypertension, Diabetes,  Dyslipidemia and Former Smoker.  Sonographer:    Dondra Prader RVT Referring Phys: [id number] JAN A MANSY  Sonographer Comments: Technically challenging study due to limited acoustic windows and Technically difficult study due to poor echo windows. Technically difficult due to elevated HR, patient supine, respiratory motion and patients inability to tolerate exam. HR between 120 bpm-140 bpm IMPRESSIONS  1. Left ventricular ejection fraction, by estimation, is 55 to 60%. The left ventricle has normal function. Left ventricular endocardial border not optimally defined to evaluate regional wall motion. There is mild left ventricular hypertrophy. Left ventricular diastolic parameters are indeterminate.  2. RV not well visualized, grossly appears normal in size and function. . Right ventricular systolic function was not well visualized. The right ventricular size is not well visualized. Tricuspid regurgitation signal is inadequate for assessing PA pressure.  3. The mitral valve was not well visualized. Trivial mitral valve regurgitation. No evidence of mitral stenosis.  4. The aortic valve was not well visualized. There is mild calcification of the aortic valve. There is mild thickening of the aortic valve. Aortic valve regurgitation is not visualized. No aortic stenosis is present.  5. The inferior vena cava is normal in size with greater than 50% respiratory variability, suggesting right atrial pressure of 3 mmHg.  6. Technically difficult study in setting of limited visualization and elevated heart rates. FINDINGS  Left Ventricle: Left ventricular ejection fraction, by estimation, is 55 to 60%. The left ventricle has normal function. Left ventricular endocardial border not optimally defined to evaluate regional wall motion. The left ventricular internal cavity size was normal in size. There is mild left ventricular hypertrophy. Left ventricular diastolic parameters are indeterminate. Right Ventricle: RV not well  visualized, grossly appears normal in size and function. The right ventricular size is not well visualized. Right vetricular wall thickness was not well visualized. Right ventricular systolic function was not well visualized.  Tricuspid regurgitation signal is inadequate for assessing PA pressure. Left Atrium: Left atrial size was normal in size. Right Atrium: Right atrial size was normal in size. Pericardium: There is no evidence of pericardial effusion. Mitral Valve: The mitral valve was not well visualized. Trivial mitral valve regurgitation. No evidence of mitral valve stenosis. Tricuspid Valve: The tricuspid valve is not well visualized. Tricuspid valve  regurgitation is trivial. No evidence of tricuspid stenosis. Aortic Valve: The aortic valve was not well visualized. There is mild calcification of the aortic valve. There is mild thickening of the aortic valve. There is mild aortic valve annular calcification. Aortic valve regurgitation is not visualized. No aortic stenosis is present. Aortic valve mean gradient measures 2.8 mmHg. Aortic valve peak gradient measures 5.3 mmHg. Aortic valve area, by VTI measures 2.17 cm. Pulmonic Valve: The pulmonic valve was not well visualized. Pulmonic valve regurgitation is not visualized. No evidence of pulmonic stenosis. Aorta: The aortic root and ascending aorta are structurally normal, with no evidence of dilitation. Venous: The inferior vena cava is normal in size with greater than 50% respiratory variability, suggesting right atrial pressure of 3 mmHg. IAS/Shunts: No atrial level shunt detected by color flow Doppler.  LEFT VENTRICLE PLAX 2D LVIDd:         4.10 cm LVIDs:         2.80 cm LV PW:         1.20 cm LV IVS:        1.20 cm LVOT diam:     2.00 cm LV SV:         32 LV SV Index:   16 LVOT Area:     3.14 cm  IVC IVC diam: 1.80 cm LEFT ATRIUM           Index LA diam:      3.70 cm 1.85 cm/m LA Vol (A4C): 34.9 ml 17.49 ml/m  AORTIC VALVE                    PULMONIC  VALVE AV Area (Vmax):    2.30 cm     PV Vmax:       0.88 m/s AV Area (Vmean):   2.18 cm     PV Peak grad:  3.1 mmHg AV Area (VTI):     2.17 cm AV Vmax:           114.60 cm/s AV Vmean:          79.140 cm/s AV VTI:            0.148 m AV Peak Grad:      5.3 mmHg AV Mean Grad:      2.8 mmHg LVOT Vmax:         83.87 cm/s LVOT Vmean:        54.867 cm/s LVOT VTI:          0.102 m LVOT/AV VTI ratio: 0.69  AORTA Ao Root diam: 2.20 cm Ao Asc diam:  3.40 cm  SHUNTS Systemic VTI:  0.10 m Systemic Diam: 2.00 cm Dina Rich MD Electronically signed by Dina Rich MD Signature Date/Time: 07/29/2023/12:24:49 PM    Final    DG Chest Portable 1 View Result Date: 07/25/2023 CLINICAL DATA:  PICC line placement EXAM: PORTABLE CHEST 1 VIEW COMPARISON:  Chest radiograph and CT chest, abdomen, and pelvis dated 07/24/2023. FINDINGS: Right PICC line tip overlies the lower SVC. Low lung volumes. Stable cardiomegaly. Aortic atherosclerosis. Similar indistinctness of the left costophrenic angle, likely correlates with prominent subpleural fat noted on the prior CT. No focal consolidation, sizeable pleural effusion, or pneumothorax. The visualized osseous structures are unchanged. IMPRESSION: 1. Right PICC line tip overlies the lower SVC. 2. Low lung volumes.  No acute cardiopulmonary findings. Electronically Signed   By: Hart Robinsons M.D.   On: 07/25/2023 13:11   [place] EKG SITE RITE Result Date: 07/25/2023  If [employer] not attached, placement could not be confirmed due to current cardiac rhythm.  CT CHEST ABDOMEN PELVIS W CONTRAST Result Date: 07/24/2023 CLINICAL DATA:  Sepsis. Urinary tract infection. Previous appendectomy, hysterectomy, oophorectomy and cholecystectomy. EXAM: CT CHEST, ABDOMEN, AND PELVIS WITH CONTRAST TECHNIQUE: Multidetector CT imaging of the chest, abdomen and pelvis was performed following the standard protocol during bolus administration of intravenous contrast. RADIATION DOSE REDUCTION: This  exam was performed according to the departmental dose-optimization program which includes automated exposure control, adjustment of the mA and/or kV according to patient size and/or use of iterative reconstruction technique. CONTRAST:  OMNIPAQUE IOHEXOL 300 MG/ML  SOLN COMPARISON:  Abdomen and pelvis CT dated 04/24/2018. Portable chest obtained earlier today. Chest CT report dated 05/15/2019. Chest CTA dated 10/18/2008. FINDINGS: CT CHEST FINDINGS Cardiovascular: Stable mildly enlarged heart aortic valve calcifications and dense mitral valve annulus calcifications. Atheromatous calcifications, including the coronary arteries and aorta. The central pulmonary arteries remain mildly dilated with a main pulmonary artery diameter of 3.4 cm. Mediastinum/Nodes: No enlarged mediastinal, hilar, or axillary lymph nodes. Interval flattening of the trachea and mainstem bronchi with an interval decreased depth of inspiration. Unremarkable thyroid gland and esophagus. Lungs/Pleura: Decreased depth of inspiration. Mild bilateral lower lobe cylindrical bronchiectasis and associated mild parenchymal densities. Prominent subpleural fat on the left causing the blunting of the lateral costophrenic angle seen radiographically. No pleural fluid Musculoskeletal: Thoracic spine degenerative changes multiple thoracic vertebral compression deformities with mild bony retropulsion and no visible acute fracture lines. Associated acute kyphosis in the upper thoracic spine. CT ABDOMEN PELVIS FINDINGS Hepatobiliary: No focal liver abnormality is seen. Status post cholecystectomy. No biliary dilatation. Pancreas: Unremarkable. No pancreatic ductal dilatation or surrounding inflammatory changes. Spleen: Normal in size without focal abnormality. Adrenals/Urinary Tract: Normal-appearing adrenal glands multiple bilateral simple appearing renal cysts. These do not need imaging follow-up unremarkable urinary bladder and ureters Stomach/Bowel: Large  number of sigmoid colon diverticula without evidence of diverticulitis. Breathing motion blurring obscuring the detail in the right lower quadrant of the abdomen. There is some fluid in gas in that region of the abdomen that is most likely intraluminal. However, due to the motion artifact, it is difficult to exclude a fluid or gas collection in that area. Surgically absent appendix by history. Unremarkable stomach. Vascular/Lymphatic: Atheromatous arterial calcifications without aneurysm. No enlarged lymph nodes. Reproductive: Status post hysterectomy. No adnexal masses. Other: Anterior subcutaneous edema and anterior subcutaneous air, compatible with recent subcutaneous injection. Moderate-sized left inguinal hernia containing fat and small to moderate-sized right inguinal hernia containing fat Musculoskeletal: Lumbar spine degenerative changes. Multiple lumbar spine vertebral compression deformities that are new since 04/24/2018. Stable old T12 vertebral compression deformity. Possible acute fracture line in the anterior, superior corner of the L2 vertebral body and possible acute fracture line in the superior endplate of the L4 vertebral body. IMPRESSION: 1. Fluid and gas in the right lower quadrant of the abdomen which is most likely within the bowel. However, this can not be confirmed as intraluminal due to motion artifacts in that area. Correlation with the presence or absence of focal pain and tenderness in that area is recommended to help exclude the possibility of an abscess or contained perforation in the right lower quadrant of the abdomen. 2. Otherwise, no acute abnormality seen. 3. Decreased depth of inspiration with flattening of the trachea and mainstem bronchi, compatible with tracheobronchomalacia. 4. Mild bilateral lower lobe cylindrical bronchiectasis and associated mild parenchymal densities, most likely due to atelectasis. 5. Stable  mild cardiomegaly. 6. Mildly dilated central pulmonary  arteries, suggesting pulmonary arterial hypertension. 7. Multiple thoracic and lumbar spine vertebral compression deformities, some of which are new since 04/24/2018. Possible acute fracture lines in the anterior, superior corner of the L2 vertebral body and superior endplate of the L4 vertebral body. 8. Sigmoid colon diverticulosis. 9. Moderate-sized left inguinal hernia containing fat and small to moderate-sized right inguinal hernia containing fat. 10. Aortic atherosclerosis. Aortic Atherosclerosis (ICD10-I70.0). Electronically Signed   By: Beckie Salts M.D.   On: 07/24/2023 16:45   CT Head Wo Contrast Result Date: 07/24/2023 CLINICAL DATA:  Mental status change. Current therapy for urinary tract infection. EXAM: CT HEAD WITHOUT CONTRAST TECHNIQUE: Contiguous axial images were obtained from the base of the skull through the vertex without intravenous contrast. RADIATION DOSE REDUCTION: This exam was performed according to the departmental dose-optimization program which includes automated exposure control, adjustment of the mA and/or kV according to patient size and/or use of iterative reconstruction technique. COMPARISON:  01/05/2023 FINDINGS: Brain: No evidence of acute infarction, hemorrhage, hydrocephalus, extra-axial collection or mass lesion/mass effect. Vascular: Encephalomalacia within the right occipital and posterior temporal lobes consistent with remote PCA infarct. Ex vacuo dilatation of the right posterolateral ventricle noted. There is mild diffuse low-attenuation within the subcortical and periventricular white matter compatible with chronic microvascular disease. Skull: Normal. Negative for fracture or focal lesion. Sinuses/Orbits: Air-fluid level within the sphenoid sinus. Partial opacification of the ethmoid air cells. Other: None. IMPRESSION: 1. No acute intracranial abnormalities. 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 4. Air-fluid level within the sphenoid sinus.  Correlate for any clinical signs or symptoms of acute sinusitis. Electronically Signed   By: Signa Kell M.D.   On: 07/24/2023 16:15   DG Chest Port 1 View Result Date: 07/24/2023 CLINICAL DATA:  Questionable sepsis - evaluate for abnormality. Weakness. Altered mental status. EXAM: PORTABLE CHEST 1 VIEW COMPARISON:  01/05/2023. FINDINGS: Redemonstration of blunting of left lateral costophrenic angle, similar to the prior study, which may represent small left pleural effusion. There are probable associated atelectatic changes at the left lung base. Bilateral lung fields are otherwise clear. No acute consolidation or lung collapse. Right lateral costophrenic angle is clear. Stable cardio-mediastinal silhouette. No acute osseous abnormalities. The soft tissues are within normal limits. There are surgical clips in the right upper quadrant, typical of a previous cholecystectomy. IMPRESSION: *Persistent blunting of left lateral costophrenic angle, which may represent small left pleural effusion. *Otherwise no acute cardiopulmonary abnormality seen. Electronically Signed   By: Jules Schick M.D.   On: 07/24/2023 14:16    Catarina Hartshorn, DO  [facility]  If 7PM-7AM, please contact night-coverage [email or web address] Password [facility] 08/05/2023, 4:50 PM   LOS: 12 days

## 2023-08-05 NOTE — Plan of Care (Signed)

## 2023-08-05 NOTE — Progress Notes (Signed)
Palliative: [name] is lying quietly in bed.  She appears acutely/chronically ill and very frail.  She is resting comfortably, but wakes easily when shake her arm.  She will briefly make but not keep eye contact.  She has known memory loss therefore I do not ask orientation questions.  I am not sure that she can make her basic needs known.  There is no family at bedside at this time.  Face-to-face conference with bedside nursing staff related to patient condition, needs.  We talk about increased creatinine from 2.16 to 2.63.  I offer [name] something to drink.  She has [employer] at bedside.  She states, "thank you", and is able to take several sips from a straw.  She declines food at this time.  After a few minutes, [name] has extended coughing.  I believe that she is aspirating.   Call to brother, [name].  We talk about [name]'s acute health concerns.  We talk about my visit this morning and although she took something to drink it seems that she aspirated.  We talk about worsening kidney function and nephrology consult.  Nephrology consult notes reviewed with recommendations.  Brother/HCPOA, [name], states that he would not want hemodialysis for [name] in any case.  [name] shares that he knows that the hospital team is doing the best that they can, "that is all we can do".  No further questions at this time.  Conference with attending, bedside nursing staff, transition of care team related to patient condition, needs, goals of care, disposition. Duplicate orders discontinued.  Plan: Continue to treat the treatable but no CPR or intubation.  Brother/HCPOA, stated that he would not want artificial feeding nor hemodialysis.  Would benefit from comfort care, brother is not ready.  PMT to follow.  50 minutes  Lillia Carmel, NP Palliative medicine team Team phone [phone]

## 2023-08-05 NOTE — Consult Note (Signed)
Nephrology Consult   Requesting provider: Catarina Hartshorn Service requesting consult: Hospitalist Reason for consult: AKI   Assessment/Recommendations: [name] is a/an 83 y.o. female with a past medical history atrial fibrillation, DM 2, HTN, esophageal dysmotility, advanced dementia residing at a SNF who present w/ influenza infection complicated by AKI   Oliguric worsening AKI: AKI occurred during hospitalization so the question is what did we due to ccause this.  She does not appear significantly dehydrated.  I am mostly worried about side effects from medication. Given vanc trough is 20 today and her last dose was 2/8 makes me suspicious she has had some high vanc levels. Also, zosyn can cause AKI via AIN. -Stop vanc and zosyn -obtain UA and RUS -Mostly supportive care -Empiric steroids for AIN could be considered but risky given her dementia and AMS -Continue to monitor daily Cr, Dose meds for GFR -Monitor Daily I/Os, Daily weight  -Maintain MAP>65 for optimal renal perfusion.  -Avoid nephrotoxic medications including NSAIDs -Use synthetic opioids (Fentanyl/Dilaudid) if needed -Currently no indication for HD but given her dementia she is not an HD candidate  Influenza infection: Management per primary team  Possible aspiration pneumonia/pneumonitis: Any further antibiotic per primary team  Atrial fibrillation with RVR: On metoprolol  Anemia: Likely multifactorial.  Mild  Dementia: Fairly advanced.  Management per primary   Recommendations conveyed to primary service.    Darnell Level [facility] 08/05/2023 12:16 PM   _____________________________________________________________________________________ CC: AMS  History of Present Illness: [name] is a/an 83 y.o. female with a past medical history of atrial fibrillation, DM 2, HTN, esophageal dysmotility, advanced dementia residing at a SNF who presents with AMS. Given the patient's altered  mental status history was obtained per chart review  Patient presented 11 days ago due to altered mental status at her facility.  They also noted increased lethargy.  They have been treating her with antibiotics for UTI .  Usually she is able to communicate to some degree but was unable to do so prior to admission.  She is bedbound at baseline.  In the emergency department she was found to have influenza and she was admitted.  She has been treated for influenza and also was started on vancomycin as well as Zosyn for possible aspiration pneumonia.  She also has had issues with atrial fibrillation with RVR and required a Cardizem gtt and amiodarone.  There was a questionable fluid collection in her abdomen evaluated by surgery who now signed off.  We were consult because creatinine was 0.4-0.6 initially and slightly rose to 0.8 on 2/8 and has steadily risen since then up to 2.6 today.urine output documented around 550 cc. No recent UA. CT on admission without kidney concern but no imaging since the creatinine rise. No obvious significant episodes of hypotension. Notably her vanc trough is 20 but her last dose was several days ago.   Medications:  Current Facility-Administered Medications  Medication Dose Route Frequency Provider Last Rate Last Admin   acetaminophen (TYLENOL) tablet 650 mg  650 mg Oral Q6H PRN Elgergawy, Leana Roe, MD   650 mg at 08/02/23 0129   Or   acetaminophen (TYLENOL) suppository 650 mg  650 mg Rectal Q6H PRN Elgergawy, Leana Roe, MD       apixaban Everlene Balls) tablet 2.5 mg  2.5 mg Oral BID Tat, David, MD   2.5 mg at 08/04/23 2216   bisacodyl (DULCOLAX) suppository 10 mg  10 mg Rectal Daily Pappayliou, Gustavus Messing, DO  10 mg at 07/31/23 1102   budesonide (PULMICORT) nebulizer solution 0.5 mg  0.5 mg Nebulization BID Tat, Onalee Hua, MD   0.5 mg at 08/05/23 1610   Chlorhexidine Gluconate Cloth 2 % PADS 6 each  6 each Topical Daily Tat, David, MD   6 each at 08/04/23 9604   feeding  supplement (ENSURE ENLIVE / ENSURE PLUS) liquid 237 mL  237 mL Oral BID BM Catarina Hartshorn, MD   237 mL at 08/03/23 0815   hydrALAZINE (APRESOLINE) injection 5 mg  5 mg Intravenous Q4H PRN Tat, Onalee Hua, MD       ipratropium (ATROVENT) nebulizer solution 0.5 mg  0.5 mg Nebulization TID Laural Benes, Clanford L, MD   0.5 mg at 08/05/23 0846   levalbuterol (XOPENEX) nebulizer solution 0.63 mg  0.63 mg Nebulization Q6H PRN Laural Benes, Clanford L, MD   0.63 mg at 07/26/23 0352   levalbuterol (XOPENEX) nebulizer solution 0.63 mg  0.63 mg Nebulization TID Standley Dakins L, MD   0.63 mg at 08/05/23 0846   levothyroxine (SYNTHROID) tablet 50 mcg  50 mcg Oral QAC breakfast Elgergawy, Leana Roe, MD   50 mcg at 08/05/23 0550   magnesium citrate solution 1 Bottle  1 Bottle Oral Once PRN Elgergawy, Leana Roe, MD       metoprolol tartrate (LOPRESSOR) tablet 25 mg  25 mg Oral BID Tat, Onalee Hua, MD       pantoprazole (PROTONIX) injection 40 mg  40 mg Intravenous Q24H Catarina Hartshorn, MD   40 mg at 08/04/23 1801   polyethylene glycol (MIRALAX / GLYCOLAX) packet 17 g  17 g Oral Daily PRN Elgergawy, Leana Roe, MD       polyvinyl alcohol (LIQUIFILM TEARS) 1.4 % ophthalmic solution 1 drop  1 drop Both Eyes QID PRN Johnson, Clanford L, MD       sodium chloride flush (NS) 0.9 % injection 10-40 mL  10-40 mL Intracatheter Q12H Johnson, Clanford L, MD   10 mL at 08/04/23 2234   sodium chloride flush (NS) 0.9 % injection 10-40 mL  10-40 mL Intracatheter PRN Johnson, Clanford L, MD       sodium chloride flush (NS) 0.9 % injection 10-40 mL  10-40 mL Intracatheter Q12H Johnson, Clanford L, MD   10 mL at 08/04/23 2234   sodium chloride flush (NS) 0.9 % injection 10-40 mL  10-40 mL Intracatheter PRN Johnson, Clanford L, MD       sodium chloride flush (NS) 0.9 % injection 10-40 mL  10-40 mL Intracatheter Pablo Ledger, MD   10 mL at 08/04/23 2233   sodium chloride flush (NS) 0.9 % injection 10-40 mL  10-40 mL Intracatheter PRN Tat, Onalee Hua, MD          ALLERGIES Cefprozil, Demerol, and Meperidine hcl  MEDICAL HISTORY Past Medical History:  Diagnosis Date   Allergy    Arthritis    Asthma    CAD (coronary artery disease)    BMS and DES to LAD 2004; DES to D2 2005   Chronic pain of left knee    Depression    Diverticulitis of intestine    Essential hypertension    FH: colonic polyps    Adenomataous   GERD (gastroesophageal reflux disease)    History of kidney stones    Hyperlipidemia    Hypothyroidism    Insomnia    Irritable bowel syndrome    NSTEMI (non-ST elevated myocardial infarction) (HCC)    2005   Paroxysmal atrial fibrillation (HCC)  Pulmonary embolism (HCC)    2000 and 2010   Sleep apnea    Type 2 diabetes mellitus (HCC)    Venous stasis      SOCIAL HISTORY Social History   Socioeconomic History   Marital status: Widowed    Spouse name: Not on file   Number of children: Not on file   Years of education: Not on file   Highest education level: Not on file  Occupational History   Occupation: retired    Associate Professor: RETIRED  Tobacco Use   Smoking status: Former    Current packs/day: 2.00    Average packs/day: 2.0 packs/day for 36.3 years (72.5 ttl pk-yrs)    Types: Cigarettes    Start date: 05/05/1987   Smokeless tobacco: Never  Vaping Use   Vaping status: Never Used  Substance and Sexual Activity   Alcohol use: No    Alcohol/week: 0.0 standard drinks of alcohol   Drug use: No   Sexual activity: Not on file  Other Topics Concern   Not on file  Social History Narrative   Not on file   Social Drivers of Health   Financial Resource Strain: Not on file  Food Insecurity: No Food Insecurity (07/25/2023)   Hunger Vital Sign    Worried About Running Out of Food in the Last Year: Never true    Ran Out of Food in the Last Year: Never true  Transportation Needs: Patient Unable To Answer (07/25/2023)   PRAPARE - Transportation    Lack of Transportation (Medical): Patient unable to answer    Lack of  Transportation (Non-Medical): Patient unable to answer  Physical Activity: Not on file  Stress: Not on file  Social Connections: Patient Unable To Answer (07/25/2023)   Social Connection and Isolation Panel [NHANES]    Frequency of Communication with Friends and Family: Patient unable to answer    Frequency of Social Gatherings with Friends and Family: Patient unable to answer    Attends Religious Services: Patient unable to answer    Active Member of Clubs or Organizations: Patient unable to answer    Attends Banker Meetings: Patient unable to answer    Marital Status: Patient unable to answer  Intimate Partner Violence: Patient Unable To Answer (07/25/2023)   Humiliation, Afraid, Rape, and Kick questionnaire    Fear of Current or Ex-Partner: Patient unable to answer    Emotionally Abused: Patient unable to answer    Physically Abused: Patient unable to answer    Sexually Abused: Patient unable to answer     FAMILY HISTORY Family History  Problem Relation Age of Onset   Heart attack Mother    Diabetes Mother    Hyperlipidemia Mother    Heart attack Father    Lung cancer Sister    Lymphoma Brother    Colon cancer Neg Hx       Review of Systems: 12 systems reviewed Otherwise as per HPI, all other systems reviewed and negative  Physical Exam: Vitals:   08/05/23 0433 08/05/23 0846  BP: 133/78   Pulse: 99   Resp: 20   Temp: 97.6 F (36.4 C)   SpO2: 98% 95%   No intake/output data recorded.  Intake/Output Summary (Last 24 hours) at 08/05/2023 1216 Last data filed at 08/05/2023 0900 Gross per 24 hour  Intake 0 ml  Output 550 ml  Net -550 ml   General: ill-appearing, lying in bed, no distress HEENT: anicteric sclera, oropharynx clear without lesions CV: normal rate, no  rub Lungs: coarse upper airway sounds,bilateral chest rise, normal work of breathing Abd: soft, non-tender, non-distended Skin: no visible lesions or rashes Psych: Will await, anxious  mood at times, normal affect Musculoskeletal: no obvious deformities Neuro:altered, awakes to voice, lethargic at times, unable to answer questions  Test Results Reviewed Lab Results  Component Value Date   NA 140 08/05/2023   K 3.6 08/05/2023   CL 107 08/05/2023   CO2 25 08/05/2023   BUN 16 08/05/2023   CREATININE 2.63 (H) 08/05/2023   CALCIUM 8.6 (L) 08/05/2023   ALBUMIN 2.7 (L) 07/30/2023    CBC Recent Labs  Lab 07/30/23 1008 07/31/23 0955  WBC 10.4 7.3  HGB 11.7* 11.0*  HCT 39.3 37.1  MCV 88.3 88.1  PLT 216 250    I have reviewed all relevant outside healthcare records related to the patient's current hospitalization

## 2023-08-06 DIAGNOSIS — I4891 Unspecified atrial fibrillation: Secondary | ICD-10-CM | POA: Diagnosis not present

## 2023-08-06 DIAGNOSIS — Z515 Encounter for palliative care: Secondary | ICD-10-CM | POA: Diagnosis not present

## 2023-08-06 DIAGNOSIS — J101 Influenza due to other identified influenza virus with other respiratory manifestations: Secondary | ICD-10-CM | POA: Diagnosis not present

## 2023-08-06 DIAGNOSIS — Z7189 Other specified counseling: Secondary | ICD-10-CM | POA: Diagnosis not present

## 2023-08-06 LAB — BASIC METABOLIC PANEL
Anion gap: 9 (ref 5–15)
BUN: 18 mg/dL (ref 8–23)
CO2: 26 mmol/L (ref 22–32)
Calcium: 8.6 mg/dL — ABNORMAL LOW (ref 8.9–10.3)
Chloride: 108 mmol/L (ref 98–111)
Creatinine, Ser: 2.57 mg/dL — ABNORMAL HIGH (ref 0.44–1.00)
GFR, Estimated: 18 mL/min — ABNORMAL LOW (ref >60)
Glucose, Bld: 93 mg/dL (ref 70–99)
Potassium: 3.5 mmol/L (ref 3.5–5.1)
Sodium: 143 mmol/L (ref 135–145)

## 2023-08-06 LAB — GLUCOSE, CAPILLARY
Glucose-Capillary: 100 mg/dL — ABNORMAL HIGH (ref 70–99)
Glucose-Capillary: 101 mg/dL — ABNORMAL HIGH (ref 70–99)
Glucose-Capillary: 106 mg/dL — ABNORMAL HIGH (ref 70–99)
Glucose-Capillary: 86 mg/dL (ref 70–99)
Glucose-Capillary: 87 mg/dL (ref 70–99)

## 2023-08-06 MED ORDER — MORPHINE SULFATE (CONCENTRATE) 10 MG /0.5 ML PO SOLN
5.0000 mg | ORAL | Status: DC | PRN
  Filled 2023-08-06: qty 0.5

## 2023-08-06 MED ORDER — LORAZEPAM 2 MG/ML PO CONC: 1.0000 mg | ORAL | Status: DC | PRN

## 2023-08-06 MED ORDER — LORAZEPAM 2 MG/ML IJ SOLN
1.0000 mg | INTRAMUSCULAR | Status: DC | PRN
  Administered 2023-08-08: 1 mg via INTRAVENOUS
  Filled 2023-08-06: qty 1

## 2023-08-06 MED ORDER — MORPHINE SULFATE (CONCENTRATE) 10 MG /0.5 ML PO SOLN
5.0000 mg | ORAL | Status: DC | PRN
  Administered 2023-08-06 – 2023-08-08 (×3): 5 mg via SUBLINGUAL
  Filled 2023-08-06 (×2): qty 0.5

## 2023-08-06 MED ORDER — LORAZEPAM 1 MG PO TABS: 1.0000 mg | ORAL_TABLET | ORAL | Status: DC | PRN

## 2023-08-06 NOTE — Progress Notes (Signed)
Nephrology Follow-Up Consult note   Assessment/Recommendations: [name] is a/an 83 y.o. female with a past medical history significant for atrial fibrillation, DM2, HTN, esophageal dysmotility, advanced dementia, admitted for altered mental status, influenza infection, complicated by AKI.       Stable nonoliguric AKI: AKI occurred during hospitalization so the question is what did we due to ccause this.  did not appear dehydrated.  Suspicion for side effect of medication although ATN is also possible.  Given vanc trough is 20 3 days after previous dose makes me suspicious she has had some high vanc levels. Also, zosyn can cause AKI via AIN.  Both of these medications have been discontinued.  Creatinine stabilizing today and urine output seems to be improving -Now off vanc and Zosyn -Renal ultrasound without significant concern -Urinalysis with numerous RBCs but negative protein, this may have been a cath'd specimen -Mostly supportive care -Empiric steroids for AIN could be considered but risky given her dementia and AMS. Could consider if Crt worsened significantly -Continue to monitor daily Cr, Dose meds for GFR -Monitor Daily I/Os, Daily weight  -Maintain MAP>65 for optimal renal perfusion.  -Avoid nephrotoxic medications including NSAIDs -Use synthetic opioids (Fentanyl/Dilaudid) if needed -Currently no indication for HD but given her dementia she is not an HD candidate   Influenza infection: Management per primary team   Possible aspiration pneumonia/pneumonitis: Any further antibiotic per primary team   Atrial fibrillation with RVR: On metoprolol   Anemia: Likely multifactorial.  Mild   Dementia: Fairly advanced.  Management per primary   Recommendations conveyed to primary service.    Darnell Level [facility] 08/06/2023 10:10 AM  ___________________________________________________________  CC: AMS  Interval History/Subjective: Patient remains  confused and unable to answer questions.  Creatinine stable today.  Urine output seems to be increased   Medications:  Current Facility-Administered Medications  Medication Dose Route Frequency Provider Last Rate Last Admin   acetaminophen (TYLENOL) tablet 650 mg  650 mg Oral Q6H PRN Elgergawy, Leana Roe, MD   650 mg at 08/02/23 0129   Or   acetaminophen (TYLENOL) suppository 650 mg  650 mg Rectal Q6H PRN Elgergawy, Leana Roe, MD       apixaban (ELIQUIS) tablet 2.5 mg  2.5 mg Oral BID Tat, David, MD   2.5 mg at 08/06/23 0847   bisacodyl (DULCOLAX) suppository 10 mg  10 mg Rectal Daily Pappayliou, Catherine A, DO   10 mg at 07/31/23 1102   budesonide (PULMICORT) nebulizer solution 0.5 mg  0.5 mg Nebulization BID Tat, David, MD   0.5 mg at 08/06/23 0750   Chlorhexidine Gluconate Cloth 2 % PADS 6 each  6 each Topical Daily Tat, David, MD   6 each at 08/06/23 0937   dextrose 5 %-0.9 % sodium chloride infusion   Intravenous Continuous Adefeso, Oladapo, DO 40 mL/hr at 08/06/23 0519 Infusion Verify at 08/06/23 0519   feeding supplement (ENSURE ENLIVE / ENSURE PLUS) liquid 237 mL  237 mL Oral BID BM Tat, Onalee Hua, MD   237 mL at 08/06/23 4098   hydrALAZINE (APRESOLINE) injection 5 mg  5 mg Intravenous Q4H PRN Tat, David, MD       ipratropium (ATROVENT) nebulizer solution 0.5 mg  0.5 mg Nebulization TID Laural Benes, Clanford L, MD   0.5 mg at 08/06/23 0750   levalbuterol (XOPENEX) nebulizer solution 0.63 mg  0.63 mg Nebulization Q6H PRN Laural Benes, Clanford L, MD   0.63 mg at 07/26/23 0352   levalbuterol (XOPENEX) nebulizer solution  0.63 mg  0.63 mg Nebulization TID Laural Benes, Clanford L, MD   0.63 mg at 08/06/23 0750   levothyroxine (SYNTHROID) tablet 50 mcg  50 mcg Oral QAC breakfast Elgergawy, Leana Roe, MD   50 mcg at 08/05/23 0550   magnesium citrate solution 1 Bottle  1 Bottle Oral Once PRN Elgergawy, Leana Roe, MD       metoprolol tartrate (LOPRESSOR) injection 2.5 mg  2.5 mg Intravenous Q6H Catarina Hartshorn, MD   2.5  mg at 08/06/23 0548   pantoprazole (PROTONIX) injection 40 mg  40 mg Intravenous Q24H Catarina Hartshorn, MD   40 mg at 08/05/23 1714   polyethylene glycol (MIRALAX / GLYCOLAX) packet 17 g  17 g Oral Daily PRN Elgergawy, Leana Roe, MD       polyvinyl alcohol (LIQUIFILM TEARS) 1.4 % ophthalmic solution 1 drop  1 drop Both Eyes QID PRN Johnson, Clanford L, MD       sodium chloride flush (NS) 0.9 % injection 10-40 mL  10-40 mL Intracatheter Q12H Johnson, Clanford L, MD   10 mL at 08/06/23 0937   sodium chloride flush (NS) 0.9 % injection 10-40 mL  10-40 mL Intracatheter PRN Johnson, Clanford L, MD          Review of Systems: Unable to obtain due to the patient's altered mental status  Physical Exam: Vitals:   08/06/23 0509 08/06/23 0750  BP: (!) 152/105   Pulse: (!) 103   Resp: 16   Temp: 97.6 F (36.4 C)   SpO2: 100% 97%   No intake/output data recorded.  Intake/Output Summary (Last 24 hours) at 08/06/2023 1010 Last data filed at 08/06/2023 4098 Gross per 24 hour  Intake 287.48 ml  Output 600 ml  Net -312.52 ml   Constitutional: Ill-appearing, lying in bed, no distress ENMT: ears and nose without scars or lesions, MMM CV: normal rate, no edema Respiratory: Bilateral chest rise with no increased work of breathing Gastrointestinal: soft, non-tender, no palpable masses or hernias Skin: no visible lesions or rashes Psych: Sleeping but awakes to sound, unable to speak, unable to answer questions   Test Results I personally reviewed new and old clinical labs and radiology tests Lab Results  Component Value Date   NA 143 08/06/2023   K 3.5 08/06/2023   CL 108 08/06/2023   CO2 26 08/06/2023   BUN 18 08/06/2023   CREATININE 2.57 (H) 08/06/2023   CALCIUM 8.6 (L) 08/06/2023   ALBUMIN 2.7 (L) 07/30/2023    CBC Recent Labs  Lab 07/31/23 0955  WBC 7.3  HGB 11.0*  HCT 37.1  MCV 88.1  PLT 250

## 2023-08-06 NOTE — Progress Notes (Signed)
Speech Language Pathology Treatment: Dysphagia  Patient Details Name: Kaede SHANIQWA HORSMAN MRN: [id number] DOB: 05-01-1941 Today's Date: 08/06/2023 Time: 4098-1191 SLP Time Calculation (min) (ACUTE ONLY): 28 min  Assessment / Plan / Recommendation Clinical Impression  Provided ongoing diagnostic dysphagia therapy. Pt was easily roused and was repositioned to upright. Pt consumed limited sips of thin liquids via bottle and cup; note some wet vocal quality but no immediate coughing. Continue to recommend NO STRAWS. Note reports of very little PO intake. Recommend continue with current diet of D1/puree and thin liquids. ST will continue to follow acutely and for goals of care,     HPI HPI: Jasmarie P Yinger is a 83 y.o. female who presented to the hospital with altered mentation and lethargy.  She was recently being treated for UTI with antibiotics.  Her past medical history significant for A-fib normally on Xarelto, diabetes, hypertension, PE, hypothyroidism, and advanced dementia and bedbound at baseline.  Her surgical history is significant for an appendectomy, hysterectomy, and cholecystectomy.  Patient is confused and not answering questions appropriately on exam, so history was obtained through EMR.  She has been intermittently tachycardic and tachypenic since admission.  She was noted to be flu positive.  A CT of the chest, abdomen, and pelvis was obtained in the emergency department.  This demonstrated a fluid and gas area in the right lower quadrant which is most likely within the bowel lumen but cannot be confirmed secondary to motion artifact.  She has no leukocytosis and a normal lactic acid.      SLP Plan  Continue with current plan of care      Recommendations for follow up therapy are one component of a multi-disciplinary discharge planning process, led by the attending physician.  Recommendations may be updated based on patient status, additional functional criteria and insurance  authorization.    Recommendations  Diet recommendations: Dysphagia 1 (puree);Thin liquid Liquids provided via: Cup Medication Administration: Crushed with puree Supervision: Full supervision/cueing for compensatory strategies Compensations: Slow rate;Small sips/bites Postural Changes and/or Swallow Maneuvers: Seated upright 90 degrees                  Oral care BID     Dysphagia, unspecified (R13.10)     Continue with current plan of care   Traci Plemons H. Romie Levee, CCC-SLP Speech Language Pathologist   Georgetta Haber  08/06/2023, 2:01 PM

## 2023-08-06 NOTE — Care Management Important Message (Signed)
Important Message  Patient Details  Name: [name] MRN: [id number] Date of Birth: [birth year]   Important Message Given:  Yes - Medicare IM     Corey Harold 08/06/2023, 12:13 PM

## 2023-08-06 NOTE — Plan of Care (Signed)
Problem: Education: Goal: Knowledge of General Education information will improve Description: Including pain rating scale, medication(s)/side effects and non-pharmacologic comfort measures Outcome: Not Met (add Reason)   Problem: Health Behavior/Discharge Planning: Goal: Ability to manage health-related needs will improve Outcome: Not Met (add Reason)   Problem: Clinical Measurements: Goal: Ability to maintain clinical measurements within normal limits will improve Outcome: Not Met (add Reason)

## 2023-08-06 NOTE — Progress Notes (Signed)
   08/05/23 2040  Vitals  Temp 97.6 F (36.4 C)  Temp Source Oral  BP (!) 149/110  MAP (mmHg) 122  BP Method Automatic  Patient Position (if appropriate) Lying  Pulse Rate (!) 104  Pulse Rate Source Monitor  Resp 20  MEWS COLOR  MEWS Score Color Green  Oxygen Therapy  SpO2 100 %  O2 Device Nasal Cannula  O2 Flow Rate (L/min) 2 L/min  MEWS Score  MEWS Temp 0  MEWS Systolic 0  MEWS Pulse 1  MEWS RR 0  MEWS LOC 0  MEWS Score 1   Dr. Thomes Dinning made aware of BP

## 2023-08-06 NOTE — Progress Notes (Signed)
Palliative:   Mrs. Pryce is lying quietly in bed.  She appears acutely/chronically ill and frail, obese.  She is resting comfortably, but wakes when I shake her arm.  She will make an somewhat keep eye contact.  Due to known dementia, orientation questions not asked.  I am not sure that she can make her basic needs known.  There is no family at bedside at this time.  I offer Mrs. Gwilliam's something to drink.  She readily accepts.  She is able to take a sip of liquid from a bottle but coughs afterwards.  I believe that she continues to aspirate.  Although it seems her kidney function may have improved, IV fluids were started overnight which can artificially affect labs.   Call to brother, Rosilyn Mings.  We talk about my visit with his sister this morning, her taking sips of liquids but coughing.  We review her kidney function and the plan for continued lab draws.  We review her vital signs.  We also talk about her D5 normal saline at 40cc for 24 hours that was started overnight.   Link Snuffer shares that he spoke with the attending hospitalist yesterday afternoon and was told that they would stop fluids, "see what her body can do on her own".  He shared that he was planning to come to the hospital tomorrow (he lives out of town) sharing that he wanted to talk with Dr. Arbutus Leas face-to-face.  I shared that Dr. Arbutus Leas is off service and hospitalist is now Dr. Mahala Menghini.  I ask Link Snuffer if he has thought about what is next.  Link Snuffer shares that if he comes to the hospital tomorrow and "sees improvements, I will let it go on a few more days".  He states that if his sister has worsened he, "might make a decision".  He asks of my opinion about Mrs. Balaguer's ability to recover.  We talk about 14 days in the hospital, no meaningful outcomes, her chronic illness burden of being bedbound with dementia at the nursing home for the last 3 years.  I shared that just like I will get sick again, so will his sister.  I attempt to talk about  what this time looks like and feels like for her.  He shares that this is "a hard decision".  Conference with attending, bedside nursing staff, transition of care team related to patient condition, needs, goals of care, disposition.   Plan: At this point continue to treat the treatable but no CPR or intubation.  Time for outcomes.  Brother Link Snuffer is to arrive 2/13 and would like to speak with attending.  States he "might" make a decision.      50 minutes  Lillia Carmel, NP Palliative Medicine Team  Team Phone [phone]

## 2023-08-06 NOTE — Progress Notes (Addendum)
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  Right lower quadrant gas collection in bowels?  Perforated fluid collection Respiratory viral panel positive for H influenza 1/31 General Surgery consulted and felt that motion artifact was causing this and did not feel that patient needed intra-abdominal antibiotic coverage and was started on clear liquids 2/3 palliative medicine consulted for goals of care 2/4 developed A-fib RVR cardiology consulted started on Cardizem Gtt  procedures 2/6 MR brain significantly motion degraded study no acute intracranial abnormality remote right PCA infarct 2/11 renal US  no collecting system dilatation left-sided renal stone bilateral renal cystic foci incompletely evaluated   Plan   acute metabolic encephalopathy on admission  mentation seems to be persistently confused-it is unclear if she is eating and she is unable to keep up with her diet  we will temporarily hold off of 5 at this time and see if she drops her sugar--- this would be sine quo non  of adult failure to thrive and once that occurs we will continue discussions with the patient's bro  extensive workup of   [-] b12 folate TSH ammonia and MRI brain shows no reversible process Persistent hypoglycemia Major neurocognitive disorder with deconditioning and  dementia Previous physician had a discussion with patient's brother that patient was not taking in enough p.o. to survive and maintain her sugars We will discontinue fluids and discuss further with family influenza A with superimposed aspiration pneumonia pneumonitis Completed Tamiflu on 2/5 for influenza A but developed further respiratory distress and had a right lobe infiltrate on x-ray 2/5  completed IV Zosyn on 2/10 for treatment of aspiration pneumonia Speech therapy recommends dysphagia 1 pured and thin liquids-somewhat vocal quality to voice and patient remains high risk for aspiration Nonoliguric AKI possibly secondary to Zosyn, possible vancomycin with AIN Kidney function has stabilized baseline creatinine was less than 1 on admission She is not dialysis candidate We will hold fluids and see if function improves although it is arbitrary at this point given her poor functional status Persistent A-fib with RVR Flipped into A-fib RVR as above was on Cardizem gtt. was transitioned to oral Cardizem but it appears patient is refusing to take the same Has been placed on metoprolol 2.5 every 6 hours scheduled Eliquis as below We will need to have further discussion with patient and family Right forearm fluid collection hematoma Aspiration was attempted by ED and only blood came out-no further workup Possible right lower quadrant fluid collection General Surgery saw the patient as per above was advance to regular diet but patient is not really eating and resumption of DOAC was okayed by general surgery history of PE Eliquis 2.5 bid Goals of care--- followed up conversation that had been undertaken by palliative care and my partner Dr. Arbutus Leas with patient brother Rosilyn Mings is a [phone] He understands that she has "lots of problems"  and that she has PNA and that she has some kidney damage additionally--he does understand that she is ill and is not getting better and that if she doesn  tbecome more coherent , it might be time  to make a decision about EOL discussions We had a good discussion exploring what had already been discussed by both Dr. Arbutus Leas and by Ms. Dove of palliative care I explained to him quite clearly that to be able to function, eat, mobilize, reproduce are all normal human functions and that she is unable to perform not 1 but several of these and that her prognosis is grim He understood this citing prior conversations and ultimately elected to pursue comfort measures I reiterated to him what this would look like and went over what starting comfort measures such as morphine would look like with the goal to decrease suffering around the end-of-life He will come up and visit tomorrow from near [place] which is where he lives--- the patient can probably discharge to [facility] tomorrow   DVT prophylaxis: eliquis  Status is: Inpatient Remains inpatient appropriate because:   Await placement back to [facility] tomorrow    Subjective: Confused-unable to get ROS  Objective + exam Vitals:   08/06/23 0008 08/06/23 0509 08/06/23 0750 08/06/23 1206  BP: (!) 149/102 (!) 152/105  (!) 139/104  Pulse: (!) 105 (!) 103  90  Resp:  16  20  Temp:  97.6 F (36.4 C)  (!) 97.5 F (36.4 C)  TempSrc:  Oral  Axillary  SpO2: 100% 100% 97% 99%  Weight:      Height:       Filed Weights   07/26/23 0400 07/27/23 0526 07/29/23 0612  Weight: 91 kg 91 kg 90.3 kg    Examination: EOMI NCAT no focal deficit no icterus no pallor no wheeze no rales no rhonchi Cannot obtain ROS Abdomen soft no rebound no guarding S1-S2 tachycardic  Data Reviewed: reviewed   CBC    Component Value Date/Time   WBC 7.3 07/31/2023 0955   RBC 4.21 07/31/2023 0955   HGB 11.0 (L) 07/31/2023 0955   HGB 12.9 04/14/2017 1232   HCT 37.1 07/31/2023 0955   HCT 40.3 04/14/2017 1232   PLT 250 07/31/2023 0955   PLT 295 04/14/2017 1232   MCV 88.1 07/31/2023 0955   MCV 84 04/14/2017 1232    MCH 26.1 07/31/2023 0955   MCHC 29.6 (L) 07/31/2023 0955   RDW 16.2 (H) 07/31/2023 0955   RDW 15.6 (H) 04/14/2017 1232   LYMPHSABS 0.5 (L) 07/24/2023 1323   LYMPHSABS 1.7 04/14/2017 1232   MONOABS 0.5 07/24/2023 1323   EOSABS 0.0 07/24/2023 1323   EOSABS 0.4 04/14/2017 1232   BASOSABS 0.0 07/24/2023 1323   BASOSABS 0.0 04/14/2017 1232      Latest Ref Rng & Units 08/06/2023    5:07 AM 08/05/2023    4:42 AM 08/04/2023    4:09 AM  CMP  Glucose 70 - 99 mg/dL 93  80  99   BUN 8 - 23 mg/dL 18  16  15    Creatinine 0.44 - 1.00 mg/dL 9.60  4.54  0.98   Sodium 135 - 145 mmol/L 143  140  139   Potassium 3.5 - 5.1 mmol/L 3.5  3.6  3.3   Chloride 98 - 111 mmol/L 108  107  105   CO2 22 - 32 mmol/L 26  25  27    Calcium 8.9 - 10.3 mg/dL 8.6  8.6  8.6  Scheduled Meds:  apixaban  2.5 mg Oral BID   bisacodyl  10 mg Rectal Daily   budesonide (PULMICORT) nebulizer solution  0.5 mg Nebulization BID   Chlorhexidine Gluconate Cloth  6 each Topical Daily   feeding supplement  237 mL Oral BID BM   ipratropium  0.5 mg Nebulization TID   levalbuterol  0.63 mg Nebulization TID   levothyroxine  50 mcg Oral QAC breakfast   metoprolol tartrate  2.5 mg Intravenous Q6H   pantoprazole (PROTONIX) IV  40 mg Intravenous Q24H   sodium chloride flush  10-40 mL Intracatheter Q12H   Continuous Infusions:  Time 75  Rhetta Mura, MD  [facility]

## 2023-08-07 DIAGNOSIS — I4891 Unspecified atrial fibrillation: Secondary | ICD-10-CM | POA: Diagnosis not present

## 2023-08-07 MED ORDER — MORPHINE SULFATE (CONCENTRATE) 10 MG /0.5 ML PO SOLN: 5.0000 mg | ORAL | 0 refills | Status: DC | PRN

## 2023-08-07 MED ORDER — LORAZEPAM 2 MG/ML PO CONC: 1.0000 mg | ORAL | 0 refills | Status: DC | PRN

## 2023-08-07 NOTE — Discharge Summary (Signed)
Physician Discharge Summary  Fusaye SIDONIE DEXHEIMER ZOX:[id number] DOB: Sep 01, 1940 DOA: 07/24/2023  PCP: Merlyn Albert, MD (Inactive)  Admit date: 07/24/2023 Discharge date: 08/07/2023  Time spent: 50 minutes  Recommendations for Outpatient Follow-up:  Will be returning to [facility] skilled facility with hospice following expect will probably pass within the next  Discharge Diagnoses:  MAIN problem for hospitalization   Toxic metabolic encephalopathy on admission secondary to iatrogenic medications with superimposed pneumonitis pneumonia  Please see below for itemized issues addressed in HOpsital- refer to other progress notes for clarity if needed  Discharge Condition: Guarded  Diet recommendation: Comfort  Filed Weights   07/26/23 0400 07/27/23 0526 07/29/23 0612  Weight: 91 kg 91 kg 90.3 kg    History of present illness:  83 year old white female resident of [facility] skilled X5 years Paroxysmal A-fib CHADVASC >4, CAD with EMS and DES to LAD in 2004 and repeat intervention 2005 Previous tonsil abscess DM TY 2 Prior pulmonary embolism Dementia with dysphagia dysmotility   Events 07/24/2023 presented altered mental status-- increasing lethargy in the setting of recent treatment for UTI with antibiotics-CT head negative CT abdomen pelvis?  Right lower quadrant gas collection in bowels?  Perforated fluid collection Respiratory viral panel positive for H influenza 1/31 General Surgery consulted and felt that motion artifact was causing this and did not feel that patient needed intra-abdominal antibiotic coverage and was started on clear liquids 2/3 palliative medicine consulted for goals of care 2/4 developed A-fib RVR cardiology consulted started on Cardizem Gtt   procedures 2/6 MR brain significantly motion degraded study no acute intracranial abnormality remote right PCA infarct 2/11 renal US  no collecting system dilatation left-sided renal stone bilateral renal cystic  foci incompletely evaluated  Hospital Course:  The patient had acute metabolic encephalopathy on admission possibly secondary to iatrogenic meds versus superimposed infection with pneumonia and pneumonitis I do not think she had sepsis on admission She was kept on IV antibiotics completed Tamiflu completed IV Zosyn and was downgraded to dysphagia 1 pured thin liquids because of high risk for aspiration There was concern about a possible right lower quadrant fluid collection but general surgery saw this and did not feel that this was a real pathology felt that this was shadowing of gas Because of her nonoliguric AKI secondary to Zosyn/vancomycin nephrology was consulted-we did a trial of IV fluids and her p.o. intake was still not able to keep up with her insensible losses and hence it was felt that with encephalopathy as well as her AKI she is not going to function or do well Multiple conversations undertaken with patient's brother who lives in [place] by Dr. Merri Ray am also followed up with with the brother after palliative care had discussed the same with him several times patient was not advancing and we had a meaningful discussion about goals of care and he elected to pursue comfort care which can be undertaken at [facility] have discontinued all nonessential meds once again inclusive of Eliquis for history of PE as well as aspirin and other anxiolytics and psychiatric meds I have kept her on minimal meds related to comfort such as morphine and Ativan we will discontinue all other nonessential meds at this time  Discharge Exam: Vitals:   08/06/23 2052 08/07/23 0526  BP:  (!) 141/88  Pulse:  100  Resp:  17  Temp:  97.6 F (36.4 C)  SpO2: 96% 96%    Subj on day of d/c   A little bit  more awake-not coherent, says she is in pain Food remains untouched at the bedside Chest is clear anteriorly She does have a knot on her right forearm Abdomen is soft no rebound ROM is intact  grossly   Discharge Instructions   Discharge Instructions     Diet - low sodium heart healthy   Complete by: As directed    Increase activity slowly   Complete by: As directed       Allergies as of 08/07/2023       Reactions   Cefprozil Nausea And Vomiting   Unknown   Demerol Nausea And Vomiting   Meperidine Hcl Other (See Comments)   Unknown        Medication List     STOP taking these medications    acetaminophen 325 MG tablet Commonly known as: TYLENOL   acetaminophen 500 MG tablet Commonly known as: TYLENOL   azithromycin 250 MG tablet Commonly known as: ZITHROMAX   bisacodyl 5 MG EC tablet Commonly known as: DULCOLAX   buPROPion 150 MG 24 hr tablet Commonly known as: WELLBUTRIN XL   cefTRIAXone 1 g injection Commonly known as: ROCEPHIN   ciprofloxacin 500 MG tablet Commonly known as: CIPRO   conjugated estrogens 0.625 MG/GM vaginal cream Commonly known as: PREMARIN   diltiazem 120 MG tablet Commonly known as: CARDIZEM   Eliquis 5 MG Tabs tablet Generic drug: apixaban   escitalopram 10 MG tablet Commonly known as: LEXAPRO   ferrous sulfate 325 (65 FE) MG tablet   furosemide 40 MG tablet Commonly known as: LASIX   hydrOXYzine 25 MG tablet Commonly known as: ATARAX   levofloxacin 500 MG tablet Commonly known as: LEVAQUIN   levothyroxine 50 MCG tablet Commonly known as: SYNTHROID   magnesium oxide 400 MG tablet Commonly known as: MAG-OX   montelukast 10 MG tablet Commonly known as: SINGULAIR   nitroGLYCERIN 0.4 MG/SPRAY spray Commonly known as: NITROLINGUAL   pantoprazole 40 MG tablet Commonly known as: PROTONIX   polyvinyl alcohol 1.4 % ophthalmic solution Commonly known as: LIQUIFILM TEARS   Potassium Chloride ER 20 MEQ Tbcr   Saline Soln   triamcinolone cream 0.1 % Commonly known as: KENALOG   Vitamin D3 25 MCG (1000 UT) Caps       TAKE these medications    albuterol (2.5 MG/3ML) 0.083% nebulizer  solution Commonly known as: PROVENTIL Take 3 mLs (2.5 mg total) by nebulization every 6 (six) hours as needed for wheezing or shortness of breath. What changed: Another medication with the same name was removed. Continue taking this medication, and follow the directions you see here.   Flovent Diskus 100 MCG/BLIST Aepb Generic drug: Fluticasone Propionate (Inhal) Inhale 2 puffs into the lungs in the morning and at bedtime.   LORazepam 2 MG/ML concentrated solution Commonly known as: ATIVAN Place 0.5 mLs (1 mg total) under the tongue every 4 (four) hours as needed for anxiety.   morphine CONCENTRATE 10 mg / 0.5 ml concentrated solution Take 0.25 mLs (5 mg total) by mouth every 2 (two) hours as needed for moderate pain (pain score 4-6) (or dyspnea).       Allergies  Allergen Reactions   Cefprozil Nausea And Vomiting    Unknown   Demerol Nausea And Vomiting   Meperidine Hcl Other (See Comments)    Unknown      The results of significant diagnostics from this hospitalization (including imaging, microbiology, ancillary and laboratory) are listed below for reference.    Significant Diagnostic Studies: US  RENAL Result Date: 08/05/2023 CLINICAL DATA:  Acute kidney injury EXAM: RENAL / URINARY TRACT ULTRASOUND COMPLETE COMPARISON:  None Available. FINDINGS: Right Kidney: Renal measurements: 12.3 x 5.5 x 4.8 cm = volume: 172.5 mL. No collecting system dilatation or perinephric fluid. There is exophytic anechoic structure measuring 6.8 cm. Margins are somewhat ill-defined but there is overlapping bowel gas and soft tissue. These are more simple on the recent CT scan of 07/24/2023. Left Kidney: Renal measurements: 10.4 x 6.1 x 4.8 cm = volume: 159 mL. No collecting system dilatation or perinephric fluid. There is an echogenic shadowing area in the left kidney measuring 12 mm. Possible stone there is also some small cystic foci identified but these are mildly complex measuring up to 3.1 cm.  Again this could be technical with the overlapping bowel gas and soft tissue. These appeared more simple on the recent CT scan of 07/24/2023. Bladder: Appears normal for degree of bladder distention. Other: None. IMPRESSION: No collecting system dilatation. Left-sided renal stone. Bilateral renal cystic foci are incompletely evaluated with the overlapping bowel gas and soft tissue but appeared more simple on the recent CT scan. Please correlate with prior CT evaluation. Electronically Signed   By: Karen Kays M.D.   On: 08/05/2023 14:36   MR BRAIN WO CONTRAST Result Date: 07/31/2023 CLINICAL DATA:  Mental status change, unknown cause. EXAM: MRI HEAD WITHOUT CONTRAST TECHNIQUE: Multiplanar, multiecho pulse sequences of the brain and surrounding structures were obtained without intravenous contrast. COMPARISON:  Head CT July 24, 2023. FINDINGS: The study is significantly degraded by motion. Brain: No acute infarction, hemorrhage, hydrocephalus, extra-axial collection or mass lesion. Remote right PCA territory infarct. At least mild to moderate chronic ischemic changes of the white matter. Moderate parenchymal volume loss. Vascular: Normal flow voids. Skull and upper cervical spine: No focal lesion identified. Sinuses/Orbits: Mucosal thickening of the paranasal sinuses with fluid level within the sphenoid sinuses. Other: None. IMPRESSION: 1. Significantly motion degraded study. 2. No acute intracranial abnormality identified. 3. Remote right PCA territory infarct. 4. At least mild to moderate chronic ischemic changes of the white matter. 5. Moderate parenchymal volume loss. 6. Paranasal sinus disease. Electronically Signed   By: Baldemar Lenis M.D.   On: 07/31/2023 12:31   DG CHEST PORT 1 VIEW Result Date: 07/30/2023 CLINICAL DATA:  Altered mental status. Shortness of breath. Lethargy. EXAM: PORTABLE CHEST 1 VIEW COMPARISON:  Chest radiograph dated 07/25/2023. FINDINGS: Right-sided PICC in  similar position. Left lung base atelectasis. Right upper lobe streaky densities may represent infiltrate. Small bilateral pleural effusions suspected. No pneumothorax. Stable cardiac silhouette. Atherosclerotic calcification of the aorta. No acute osseous pathology. IMPRESSION: 1. Left lung base atelectasis. 2. Right upper lobe infiltrate. 3. Small bilateral pleural effusions. Electronically Signed   By: Elgie Collard M.D.   On: 07/30/2023 14:15   ECHOCARDIOGRAM COMPLETE Result Date: 07/29/2023    ECHOCARDIOGRAM REPORT   Patient Name:   LINDZEE GOUGE Date of Exam: 07/29/2023 Medical Rec #:  [id number]      Height:       66.0 in Accession #:    [id number]     Weight:       199.1 lb Date of Birth:  1941-04-18       BSA:          1.996 m Patient Age:    82 years       BP:           147/104 mmHg Patient Gender: F  HR:           120 bpm. Exam Location:  Inpatient Procedure: 2D Echo, Cardiac Doppler and Color Doppler Indications:    A-fib  History:        Patient has prior history of Echocardiogram examinations, most                 recent 12/24/2017. CKD, Arrythmias:Atrial Fibrillation; Risk                 Factors:Hypertension, Diabetes, Dyslipidemia and Former Smoker.  Sonographer:    Dondra Prader RVT Referring Phys: [id number] JAN A MANSY  Sonographer Comments: Technically challenging study due to limited acoustic windows and Technically difficult study due to poor echo windows. Technically difficult due to elevated HR, patient supine, respiratory motion and patients inability to tolerate exam. HR between 120 bpm-140 bpm IMPRESSIONS  1. Left ventricular ejection fraction, by estimation, is 55 to 60%. The left ventricle has normal function. Left ventricular endocardial border not optimally defined to evaluate regional wall motion. There is mild left ventricular hypertrophy. Left ventricular diastolic parameters are indeterminate.  2. RV not well visualized, grossly appears normal in size and function. . Right  ventricular systolic function was not well visualized. The right ventricular size is not well visualized. Tricuspid regurgitation signal is inadequate for assessing PA pressure.  3. The mitral valve was not well visualized. Trivial mitral valve regurgitation. No evidence of mitral stenosis.  4. The aortic valve was not well visualized. There is mild calcification of the aortic valve. There is mild thickening of the aortic valve. Aortic valve regurgitation is not visualized. No aortic stenosis is present.  5. The inferior vena cava is normal in size with greater than 50% respiratory variability, suggesting right atrial pressure of 3 mmHg.  6. Technically difficult study in setting of limited visualization and elevated heart rates. FINDINGS  Left Ventricle: Left ventricular ejection fraction, by estimation, is 55 to 60%. The left ventricle has normal function. Left ventricular endocardial border not optimally defined to evaluate regional wall motion. The left ventricular internal cavity size was normal in size. There is mild left ventricular hypertrophy. Left ventricular diastolic parameters are indeterminate. Right Ventricle: RV not well visualized, grossly appears normal in size and function. The right ventricular size is not well visualized. Right vetricular wall thickness was not well visualized. Right ventricular systolic function was not well visualized.  Tricuspid regurgitation signal is inadequate for assessing PA pressure. Left Atrium: Left atrial size was normal in size. Right Atrium: Right atrial size was normal in size. Pericardium: There is no evidence of pericardial effusion. Mitral Valve: The mitral valve was not well visualized. Trivial mitral valve regurgitation. No evidence of mitral valve stenosis. Tricuspid Valve: The tricuspid valve is not well visualized. Tricuspid valve regurgitation is trivial. No evidence of tricuspid stenosis. Aortic Valve: The aortic valve was not well visualized. There is  mild calcification of the aortic valve. There is mild thickening of the aortic valve. There is mild aortic valve annular calcification. Aortic valve regurgitation is not visualized. No aortic stenosis is present. Aortic valve mean gradient measures 2.8 mmHg. Aortic valve peak gradient measures 5.3 mmHg. Aortic valve area, by VTI measures 2.17 cm. Pulmonic Valve: The pulmonic valve was not well visualized. Pulmonic valve regurgitation is not visualized. No evidence of pulmonic stenosis. Aorta: The aortic root and ascending aorta are structurally normal, with no evidence of dilitation. Venous: The inferior vena cava is normal in size with greater than 50% respiratory  variability, suggesting right atrial pressure of 3 mmHg. IAS/Shunts: No atrial level shunt detected by color flow Doppler.  LEFT VENTRICLE PLAX 2D LVIDd:         4.10 cm LVIDs:         2.80 cm LV PW:         1.20 cm LV IVS:        1.20 cm LVOT diam:     2.00 cm LV SV:         32 LV SV Index:   16 LVOT Area:     3.14 cm  IVC IVC diam: 1.80 cm LEFT ATRIUM           Index LA diam:      3.70 cm 1.85 cm/m LA Vol (A4C): 34.9 ml 17.49 ml/m  AORTIC VALVE                    PULMONIC VALVE AV Area (Vmax):    2.30 cm     PV Vmax:       0.88 m/s AV Area (Vmean):   2.18 cm     PV Peak grad:  3.1 mmHg AV Area (VTI):     2.17 cm AV Vmax:           114.60 cm/s AV Vmean:          79.140 cm/s AV VTI:            0.148 m AV Peak Grad:      5.3 mmHg AV Mean Grad:      2.8 mmHg LVOT Vmax:         83.87 cm/s LVOT Vmean:        54.867 cm/s LVOT VTI:          0.102 m LVOT/AV VTI ratio: 0.69  AORTA Ao Root diam: 2.20 cm Ao Asc diam:  3.40 cm  SHUNTS Systemic VTI:  0.10 m Systemic Diam: 2.00 cm Dina Rich MD Electronically signed by Dina Rich MD Signature Date/Time: 07/29/2023/12:24:49 PM    Final    DG Chest Portable 1 View Result Date: 07/25/2023 CLINICAL DATA:  PICC line placement EXAM: PORTABLE CHEST 1 VIEW COMPARISON:  Chest radiograph and CT chest, abdomen,  and pelvis dated 07/24/2023. FINDINGS: Right PICC line tip overlies the lower SVC. Low lung volumes. Stable cardiomegaly. Aortic atherosclerosis. Similar indistinctness of the left costophrenic angle, likely correlates with prominent subpleural fat noted on the prior CT. No focal consolidation, sizeable pleural effusion, or pneumothorax. The visualized osseous structures are unchanged. IMPRESSION: 1. Right PICC line tip overlies the lower SVC. 2. Low lung volumes.  No acute cardiopulmonary findings. Electronically Signed   By: Hart Robinsons M.D.   On: 07/25/2023 13:11   [place] EKG SITE RITE Result Date: 07/25/2023 If Site Rite image not attached, placement could not be confirmed due to current cardiac rhythm.  CT CHEST ABDOMEN PELVIS W CONTRAST Result Date: 07/24/2023 CLINICAL DATA:  Sepsis. Urinary tract infection. Previous appendectomy, hysterectomy, oophorectomy and cholecystectomy. EXAM: CT CHEST, ABDOMEN, AND PELVIS WITH CONTRAST TECHNIQUE: Multidetector CT imaging of the chest, abdomen and pelvis was performed following the standard protocol during bolus administration of intravenous contrast. RADIATION DOSE REDUCTION: This exam was performed according to the departmental dose-optimization program which includes automated exposure control, adjustment of the mA and/or kV according to patient size and/or use of iterative reconstruction technique. CONTRAST:  OMNIPAQUE IOHEXOL 300 MG/ML  SOLN COMPARISON:  Abdomen and pelvis CT dated 04/24/2018. Portable chest  obtained earlier today. Chest CT report dated 05/15/2019. Chest CTA dated 10/18/2008. FINDINGS: CT CHEST FINDINGS Cardiovascular: Stable mildly enlarged heart aortic valve calcifications and dense mitral valve annulus calcifications. Atheromatous calcifications, including the coronary arteries and aorta. The central pulmonary arteries remain mildly dilated with a main pulmonary artery diameter of 3.4 cm. Mediastinum/Nodes: No enlarged mediastinal,  hilar, or axillary lymph nodes. Interval flattening of the trachea and mainstem bronchi with an interval decreased depth of inspiration. Unremarkable thyroid gland and esophagus. Lungs/Pleura: Decreased depth of inspiration. Mild bilateral lower lobe cylindrical bronchiectasis and associated mild parenchymal densities. Prominent subpleural fat on the left causing the blunting of the lateral costophrenic angle seen radiographically. No pleural fluid Musculoskeletal: Thoracic spine degenerative changes multiple thoracic vertebral compression deformities with mild bony retropulsion and no visible acute fracture lines. Associated acute kyphosis in the upper thoracic spine. CT ABDOMEN PELVIS FINDINGS Hepatobiliary: No focal liver abnormality is seen. Status post cholecystectomy. No biliary dilatation. Pancreas: Unremarkable. No pancreatic ductal dilatation or surrounding inflammatory changes. Spleen: Normal in size without focal abnormality. Adrenals/Urinary Tract: Normal-appearing adrenal glands multiple bilateral simple appearing renal cysts. These do not need imaging follow-up unremarkable urinary bladder and ureters Stomach/Bowel: Large number of sigmoid colon diverticula without evidence of diverticulitis. Breathing motion blurring obscuring the detail in the right lower quadrant of the abdomen. There is some fluid in gas in that region of the abdomen that is most likely intraluminal. However, due to the motion artifact, it is difficult to exclude a fluid or gas collection in that area. Surgically absent appendix by history. Unremarkable stomach. Vascular/Lymphatic: Atheromatous arterial calcifications without aneurysm. No enlarged lymph nodes. Reproductive: Status post hysterectomy. No adnexal masses. Other: Anterior subcutaneous edema and anterior subcutaneous air, compatible with recent subcutaneous injection. Moderate-sized left inguinal hernia containing fat and small to moderate-sized right inguinal hernia  containing fat Musculoskeletal: Lumbar spine degenerative changes. Multiple lumbar spine vertebral compression deformities that are new since 04/24/2018. Stable old T12 vertebral compression deformity. Possible acute fracture line in the anterior, superior corner of the L2 vertebral body and possible acute fracture line in the superior endplate of the L4 vertebral body. IMPRESSION: 1. Fluid and gas in the right lower quadrant of the abdomen which is most likely within the bowel. However, this can not be confirmed as intraluminal due to motion artifacts in that area. Correlation with the presence or absence of focal pain and tenderness in that area is recommended to help exclude the possibility of an abscess or contained perforation in the right lower quadrant of the abdomen. 2. Otherwise, no acute abnormality seen. 3. Decreased depth of inspiration with flattening of the trachea and mainstem bronchi, compatible with tracheobronchomalacia. 4. Mild bilateral lower lobe cylindrical bronchiectasis and associated mild parenchymal densities, most likely due to atelectasis. 5. Stable mild cardiomegaly. 6. Mildly dilated central pulmonary arteries, suggesting pulmonary arterial hypertension. 7. Multiple thoracic and lumbar spine vertebral compression deformities, some of which are new since 04/24/2018. Possible acute fracture lines in the anterior, superior corner of the L2 vertebral body and superior endplate of the L4 vertebral body. 8. Sigmoid colon diverticulosis. 9. Moderate-sized left inguinal hernia containing fat and small to moderate-sized right inguinal hernia containing fat. 10. Aortic atherosclerosis. Aortic Atherosclerosis (ICD10-I70.0). Electronically Signed   By: Beckie Salts M.D.   On: 07/24/2023 16:45   CT Head Wo Contrast Result Date: 07/24/2023 CLINICAL DATA:  Mental status change. Current therapy for urinary tract infection. EXAM: CT HEAD WITHOUT CONTRAST TECHNIQUE: Contiguous axial images were  obtained from the base of the skull through the vertex without intravenous contrast. RADIATION DOSE REDUCTION: This exam was performed according to the departmental dose-optimization program which includes automated exposure control, adjustment of the mA and/or kV according to patient size and/or use of iterative reconstruction technique. COMPARISON:  01/05/2023 FINDINGS: Brain: No evidence of acute infarction, hemorrhage, hydrocephalus, extra-axial collection or mass lesion/mass effect. Vascular: Encephalomalacia within the right occipital and posterior temporal lobes consistent with remote PCA infarct. Ex vacuo dilatation of the right posterolateral ventricle noted. There is mild diffuse low-attenuation within the subcortical and periventricular white matter compatible with chronic microvascular disease. Skull: Normal. Negative for fracture or focal lesion. Sinuses/Orbits: Air-fluid level within the sphenoid sinus. Partial opacification of the ethmoid air cells. Other: None. IMPRESSION: 1. No acute intracranial abnormalities. 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 4. Air-fluid level within the sphenoid sinus. Correlate for any clinical signs or symptoms of acute sinusitis. Electronically Signed   By: Signa Kell M.D.   On: 07/24/2023 16:15   DG Chest Port 1 View Result Date: 07/24/2023 CLINICAL DATA:  Questionable sepsis - evaluate for abnormality. Weakness. Altered mental status. EXAM: PORTABLE CHEST 1 VIEW COMPARISON:  01/05/2023. FINDINGS: Redemonstration of blunting of left lateral costophrenic angle, similar to the prior study, which may represent small left pleural effusion. There are probable associated atelectatic changes at the left lung base. Bilateral lung fields are otherwise clear. No acute consolidation or lung collapse. Right lateral costophrenic angle is clear. Stable cardio-mediastinal silhouette. No acute osseous abnormalities. The soft tissues are within normal  limits. There are surgical clips in the right upper quadrant, typical of a previous cholecystectomy. IMPRESSION: *Persistent blunting of left lateral costophrenic angle, which may represent small left pleural effusion. *Otherwise no acute cardiopulmonary abnormality seen. Electronically Signed   By: Jules Schick M.D.   On: 07/24/2023 14:16    Microbiology: No results found for this or any previous visit (from the past 240 hours).   Labs: Basic Metabolic Panel: Recent Labs  Lab 08/02/23 0613 08/03/23 0406 08/04/23 0409 08/05/23 0442 08/06/23 0507  NA 141 137 139 140 143  K 2.8* 3.2* 3.3* 3.6 3.5  CL 104 101 105 107 108  CO2 28 28 27 25 26   GLUCOSE 82 71 99 80 93  BUN 10 13 15 16 18   CREATININE 0.83 1.63* 2.16* 2.63* 2.57*  CALCIUM 8.4* 8.5* 8.6* 8.6* 8.6*  MG 1.6* 2.0 1.9 1.9  --    Liver Function Tests: No results for input(s): "AST", "ALT", "ALKPHOS", "BILITOT", "PROT", "ALBUMIN" in the last 168 hours. No results for input(s): "LIPASE", "AMYLASE" in the last 168 hours. No results for input(s): "AMMONIA" in the last 168 hours. CBC: No results for input(s): "WBC", "NEUTROABS", "HGB", "HCT", "MCV", "PLT" in the last 168 hours. Cardiac Enzymes: No results for input(s): "CKTOTAL", "CKMB", "CKMBINDEX", "TROPONINI" in the last 168 hours. BNP: BNP (last 3 results) Recent Labs    07/31/23 0955  BNP 279.0*    ProBNP (last 3 results) No results for input(s): "PROBNP" in the last 8760 hours.  CBG: Recent Labs  Lab 08/06/23 0000 08/06/23 0356 08/06/23 0730 08/06/23 1117 08/06/23 1639  GLUCAP 86 87 106* 100* 101*    Signed:  Rhetta Mura MD   [facility] 08/07/2023, 10:58 AM

## 2023-08-07 NOTE — TOC Progression Note (Signed)
Transition of Care [facility]) - Progression Note    Patient Details  Name: [name] MRN: [id number] Date of Birth: 01/18/1941  Transition [facility]) CM/SW Contact  Elliot Gault, LCSW Phone Number: 08/07/2023, 12:17 PM  Clinical Narrative:     TOC following. MD spoke with pt's brother this AM with plan for pt to transition to comfort care and return to [facility] with hospice per MD.  When this LCSW called pt's brother to review, he stated that it was his understanding that pt would stay at the hospital with comfort care and he prefer that she not go back to [facility].  Updated MD who then spoke again with brother who now requests referral to [facility] MD explained to brother that it pt is not accepted for [facility], then plan will have to be for return to [facility] with hospice care there. Brother expressed understanding to MD.   Referral made to [facility], [place]. Per Lelon Mast, they do not have any slots available today for their RN to assess but they can come between 9:30-10:00 tomorrow AM. Lelon Mast to reach out to pt's brother to coordinate.   Updated MD and Eunice Blase at [facility].  Will follow up in AM.  Expected Discharge Plan: Long Term Nursing Home Barriers to Discharge: Other (must enter comment) (residential hospice evaluation pending)  Expected Discharge Plan and Services In-house Referral: Clinical Social Work Discharge Planning Services: CM Consult Post Acute Care Choice: Hospice Living arrangements for the past 2 months: Skilled Nursing Facility Expected Discharge Date: 08/07/23                                     Social Determinants of Health (SDOH) Interventions SDOH Screenings   Food Insecurity: No Food Insecurity (07/25/2023)  Housing: Patient Unable To Answer (07/25/2023)  Transportation Needs: Patient Unable To Answer (07/25/2023)  Utilities: Not At Risk (07/25/2023)  Social Connections: Patient Unable To  Answer (07/25/2023)  Tobacco Use: Medium Risk (07/28/2023)    Readmission Risk Interventions    07/25/2023   12:54 PM  Readmission Risk Prevention Plan  Transportation Screening Complete  HRI or Home Care Consult Complete  Social Work Consult for Recovery Care Planning/Counseling Complete  Palliative Care Screening Not Applicable  Medication Review Oceanographer) Complete

## 2023-08-07 NOTE — Progress Notes (Signed)
Brief Nephrology Note  Patient moving towards more of a comfort approach led by primary team and palliative care. Appreciate their help. We will sign off for now but are available if needed.

## 2023-08-08 ENCOUNTER — Inpatient Hospital Stay (HOSPITAL_COMMUNITY)
Admission: EM | Admit: 2023-08-08 | Discharge: 2023-08-09 | DRG: 091 | Disposition: A | Discharge status: Hospice | Payer: Medicare (Managed Care) | Source: Hospice | Attending: Family Medicine | Admitting: Family Medicine

## 2023-08-08 DIAGNOSIS — R1310 Dysphagia, unspecified: Secondary | ICD-10-CM | POA: Diagnosis present

## 2023-08-08 DIAGNOSIS — J984 Other disorders of lung: Secondary | ICD-10-CM | POA: Diagnosis present

## 2023-08-08 DIAGNOSIS — I480 Paroxysmal atrial fibrillation: Secondary | ICD-10-CM | POA: Diagnosis present

## 2023-08-08 DIAGNOSIS — F0390 Unspecified dementia without behavioral disturbance: Secondary | ICD-10-CM | POA: Diagnosis present

## 2023-08-08 DIAGNOSIS — N179 Acute kidney failure, unspecified: Secondary | ICD-10-CM | POA: Diagnosis present

## 2023-08-08 DIAGNOSIS — R4182 Altered mental status, unspecified: Secondary | ICD-10-CM | POA: Diagnosis present

## 2023-08-08 DIAGNOSIS — Z515 Encounter for palliative care: Secondary | ICD-10-CM

## 2023-08-08 DIAGNOSIS — J189 Pneumonia, unspecified organism: Secondary | ICD-10-CM | POA: Diagnosis present

## 2023-08-08 DIAGNOSIS — G928 Other toxic encephalopathy: Secondary | ICD-10-CM | POA: Diagnosis present

## 2023-08-08 DIAGNOSIS — Z86711 Personal history of pulmonary embolism: Secondary | ICD-10-CM | POA: Diagnosis not present

## 2023-08-08 DIAGNOSIS — I2510 Atherosclerotic heart disease of native coronary artery without angina pectoris: Secondary | ICD-10-CM | POA: Diagnosis present

## 2023-08-08 MED ORDER — GLYCOPYRROLATE 0.2 MG/ML IJ SOLN: 0.2000 mg | INTRAMUSCULAR | Status: DC | PRN

## 2023-08-08 MED ORDER — BIOTENE DRY MOUTH MT LIQD: 15.0000 mL | OROMUCOSAL | Status: DC | PRN

## 2023-08-08 MED ORDER — SODIUM CHLORIDE 0.9% FLUSH
10.0000 mL | INTRAVENOUS | Status: DC | PRN
Start: 2023-08-08 — End: 2023-08-10

## 2023-08-08 MED ORDER — MORPHINE SULFATE (CONCENTRATE) 10 MG /0.5 ML PO SOLN: 5.0000 mg | ORAL | Status: DC | PRN

## 2023-08-08 MED ORDER — LORAZEPAM 1 MG PO TABS: 1.0000 mg | ORAL_TABLET | ORAL | Status: DC | PRN

## 2023-08-08 MED ORDER — BUDESONIDE 0.25 MG/2ML IN SUSP: 0.2500 mg | Freq: Two times a day (BID) | RESPIRATORY_TRACT | Status: DC

## 2023-08-08 MED ORDER — LORAZEPAM 2 MG/ML IJ SOLN
1.0000 mg | INTRAMUSCULAR | Status: DC | PRN
  Administered 2023-08-08: 1 mg via INTRAVENOUS
  Filled 2023-08-08: qty 1

## 2023-08-08 MED ORDER — ACETAMINOPHEN 650 MG RE SUPP: 650.0000 mg | Freq: Four times a day (QID) | RECTAL | Status: DC | PRN

## 2023-08-08 MED ORDER — GLYCOPYRROLATE 0.2 MG/ML IJ SOLN
0.2000 mg | INTRAMUSCULAR | Status: DC | PRN
  Administered 2023-08-09 (×2): 0.2 mg via INTRAVENOUS
  Filled 2023-08-08 (×2): qty 1

## 2023-08-08 MED ORDER — ALBUTEROL SULFATE (2.5 MG/3ML) 0.083% IN NEBU: 2.5000 mg | INHALATION_SOLUTION | Freq: Four times a day (QID) | RESPIRATORY_TRACT | Status: DC | PRN

## 2023-08-08 MED ORDER — ONDANSETRON HCL 4 MG/2ML IJ SOLN: 4.0000 mg | Freq: Four times a day (QID) | INTRAMUSCULAR | Status: DC | PRN

## 2023-08-08 MED ORDER — ONDANSETRON 4 MG PO TBDP: 4.0000 mg | ORAL_TABLET | Freq: Four times a day (QID) | ORAL | Status: DC | PRN

## 2023-08-08 MED ORDER — ACETAMINOPHEN 325 MG PO TABS: 650.0000 mg | ORAL_TABLET | Freq: Four times a day (QID) | ORAL | Status: DC | PRN

## 2023-08-08 MED ORDER — SODIUM CHLORIDE 0.9% FLUSH
10.0000 mL | Freq: Two times a day (BID) | INTRAVENOUS | Status: DC
  Administered 2023-08-08 – 2023-08-09 (×2): 10 mL

## 2023-08-08 MED ORDER — POLYVINYL ALCOHOL 1.4 % OP SOLN: 1.0000 [drp] | Freq: Four times a day (QID) | OPHTHALMIC | Status: DC | PRN

## 2023-08-08 MED ORDER — HALOPERIDOL 0.5 MG PO TABS: 0.5000 mg | ORAL_TABLET | ORAL | Status: DC | PRN

## 2023-08-08 MED ORDER — GLYCOPYRROLATE 1 MG PO TABS: 1.0000 mg | ORAL_TABLET | ORAL | Status: DC | PRN

## 2023-08-08 MED ORDER — HALOPERIDOL LACTATE 5 MG/ML IJ SOLN: 0.5000 mg | INTRAMUSCULAR | Status: DC | PRN

## 2023-08-08 MED ORDER — HALOPERIDOL LACTATE 2 MG/ML PO CONC: 0.5000 mg | ORAL | Status: DC | PRN

## 2023-08-08 MED ORDER — MORPHINE SULFATE (CONCENTRATE) 10 MG /0.5 ML PO SOLN
5.0000 mg | ORAL | Status: DC | PRN
  Administered 2023-08-08 – 2023-08-09 (×4): 5 mg via SUBLINGUAL
  Filled 2023-08-08 (×4): qty 0.5

## 2023-08-08 MED ORDER — CHLORHEXIDINE GLUCONATE CLOTH 2 % EX PADS
6.0000 | MEDICATED_PAD | Freq: Every day | CUTANEOUS | Status: DC
  Administered 2023-08-09: 6 via TOPICAL

## 2023-08-08 MED ORDER — ENSURE ENLIVE PO LIQD: 237.0000 mL | Freq: Two times a day (BID) | ORAL | Status: DC

## 2023-08-08 NOTE — H&P (Signed)
GIP admit  History of present illness:  83 year old white female resident of [facility] skilled X5 years Paroxysmal A-fib CHADVASC >4, CAD with EMS and DES to LAD in 2004 and repeat intervention 2005 Previous tonsil abscess DM TY 2 Prior pulmonary embolism Dementia with dysphagia dysmotility   Events 07/24/2023 presented altered mental status-- increasing lethargy in the setting of recent treatment for UTI with antibiotics-CT head negative CT abdomen pelvis?  Right lower quadrant gas collection in bowels?  Perforated fluid collection Respiratory viral panel positive for H influenza 1/31 General Surgery consulted and felt that motion artifact was causing this and did not feel that patient needed intra-abdominal antibiotic coverage and was started on clear liquids 2/3 palliative medicine consulted for goals of care 2/4 developed A-fib RVR cardiology consulted started on Cardizem Gtt   procedures 2/6 MR brain significantly motion degraded study no acute intracranial abnormality remote right PCA infarct 2/11 renal US  no collecting system dilatation left-sided renal stone bilateral renal cystic foci incompletely evaluated   Hospital Course:  The patient had acute metabolic encephalopathy on admission possibly secondary to iatrogenic meds versus superimposed infection with pneumonia and pneumonitis I do not think she had sepsis on admission She was kept on IV antibiotics completed Tamiflu completed IV Zosyn and was downgraded to dysphagia 1 pured thin liquids because of high risk for aspiration There was concern about a possible right lower quadrant fluid collection but general surgery saw this and did not feel that this was a real pathology felt that this was shadowing of gas Because of her nonoliguric AKI secondary to Zosyn/vancomycin nephrology was consulted-we did a trial of IV fluids and her p.o. intake was still not able to keep up with her insensible losses and hence it was felt that  with encephalopathy as well as her AKI she is not going to function or do well  Multiple conversations undertaken with patient's brother who lives in [place] by Dr.Tat  He was interested in patient going to hospice facility at [facility] but they do not have a bed available Patient is admitted under GIP status for in-hospital hospice death  She was At time of my arrival and will review She is breathing a little bit deeply but does not seem uncomfortable Her left arm is not swollen her right has a hematoma S1-S2 no murmur She has chronic mild lower extremity edema  P Comfort orders with haloperidol lorazepam Robinul and morphine sublingually We can use nebs Pulmicort as needed Continue air mattress I do not anticipate she will survive beyond the next 48 hours and will be an in-hospital death We will review periodically but nursing may pronounce her if she passes overngiht

## 2023-08-08 NOTE — Care Management Important Message (Signed)
Important Message  Patient Details  Name: [name] MRN: [id number] Date of Birth: 1940/08/10   Important Message Given:  Yes - Medicare IM     Corey Harold 08/08/2023, 11:45 AM

## 2023-08-08 NOTE — Discharge Summary (Signed)
Physician Discharge Summary  [name]:[id number] DOB: 1941/05/09 DOA: 07/24/2023  PCP: [name] Albert, MD (Inactive)  Admit date: 07/24/2023 Discharge date: 08/08/2023  Patient will be d/c and will be GIP  No changes to condition  Time spent: 50 minutes  Recommendations for Outpatient Follow-up:  Will be returning to [facility] skilled facility with hospice following expect will probably pass within the next  Discharge Diagnoses:  MAIN problem for hospitalization   Toxic metabolic encephalopathy on admission secondary to iatrogenic medications with superimposed pneumonitis pneumonia  Please see below for itemized issues addressed in HOpsital- refer to other progress notes for clarity if needed  Discharge Condition: Guarded  Diet recommendation: Comfort  Filed Weights   07/26/23 0400 07/27/23 0526 07/29/23 0612  Weight: 91 kg 91 kg 90.3 kg    History of present illness:  83 year old white female resident of [facility] skilled X5 years Paroxysmal A-fib CHADVASC >4, CAD with EMS and DES to LAD in 2004 and repeat intervention 2005 Previous tonsil abscess DM TY 2 Prior pulmonary embolism Dementia with dysphagia dysmotility   Events 07/24/2023 presented altered mental status-- increasing lethargy in the setting of recent treatment for UTI with antibiotics-CT head negative CT abdomen pelvis?  Right lower quadrant gas collection in bowels?  Perforated fluid collection Respiratory viral panel positive for H influenza 1/31 General Surgery consulted and felt that motion artifact was causing this and did not feel that patient needed intra-abdominal antibiotic coverage and was started on clear liquids 2/3 palliative medicine consulted for goals of care 2/4 developed A-fib RVR cardiology consulted started on Cardizem Gtt   procedures 2/6 MR brain significantly motion degraded study no acute intracranial abnormality remote right PCA infarct 2/11 renal US  no  collecting system dilatation left-sided renal stone bilateral renal cystic foci incompletely evaluated  Hospital Course:  The patient had acute metabolic encephalopathy on admission possibly secondary to iatrogenic meds versus superimposed infection with pneumonia and pneumonitis I do not think she had sepsis on admission She was kept on IV antibiotics completed Tamiflu completed IV Zosyn and was downgraded to dysphagia 1 pured thin liquids because of high risk for aspiration There was concern about a possible right lower quadrant fluid collection but general surgery saw this and did not feel that this was a real pathology felt that this was shadowing of gas Because of her nonoliguric AKI secondary to Zosyn/vancomycin nephrology was consulted-we did a trial of IV fluids and her p.o. intake was still not able to keep up with her insensible losses and hence it was felt that with encephalopathy as well as her AKI she is not going to function or do well Multiple conversations undertaken with patient's brother who lives in [place] by Dr. Merri Ray am also followed up with with the brother after palliative care had discussed the same with him several times patient was not advancing and we had a meaningful discussion about goals of care and he elected to pursue comfort care which can be undertaken at [facility] have discontinued all nonessential meds once again inclusive of Eliquis for history of PE as well as aspirin and other anxiolytics and psychiatric meds I have kept her on minimal meds related to comfort such as morphine and Ativan we will discontinue all other nonessential meds at this time  Discharge Exam: Vitals:   08/07/23 1320 08/08/23 0435  BP: 134/84 (!) 128/108  Pulse: 96 (!) 109  Resp: 16 14  Temp: (!) 97.3 F (36.3 C) 98.4 F (  36.9 C)  SpO2: 100% 99%    Subj on day of d/c   A little bit more awake-not coherent, says she is in pain Food remains untouched at the bedside Chest  is clear anteriorly She does have a knot on her right forearm Abdomen is soft no rebound ROM is intact grossly   Discharge Instructions   Discharge Instructions     Diet - low sodium heart healthy   Complete by: As directed    Increase activity slowly   Complete by: As directed       Allergies as of 08/08/2023       Reactions   Cefprozil Nausea And Vomiting   Unknown   Demerol Nausea And Vomiting   Meperidine Hcl Other (See Comments)   Unknown        Medication List     STOP taking these medications    acetaminophen 325 MG tablet Commonly known as: TYLENOL   acetaminophen 500 MG tablet Commonly known as: TYLENOL   azithromycin 250 MG tablet Commonly known as: ZITHROMAX   bisacodyl 5 MG EC tablet Commonly known as: DULCOLAX   buPROPion 150 MG 24 hr tablet Commonly known as: WELLBUTRIN XL   cefTRIAXone 1 g injection Commonly known as: ROCEPHIN   ciprofloxacin 500 MG tablet Commonly known as: CIPRO   conjugated estrogens 0.625 MG/GM vaginal cream Commonly known as: PREMARIN   diltiazem 120 MG tablet Commonly known as: CARDIZEM   Eliquis 5 MG Tabs tablet Generic drug: apixaban   escitalopram 10 MG tablet Commonly known as: LEXAPRO   ferrous sulfate 325 (65 FE) MG tablet   furosemide 40 MG tablet Commonly known as: LASIX   hydrOXYzine 25 MG tablet Commonly known as: ATARAX   levofloxacin 500 MG tablet Commonly known as: LEVAQUIN   levothyroxine 50 MCG tablet Commonly known as: SYNTHROID   magnesium oxide 400 MG tablet Commonly known as: MAG-OX   montelukast 10 MG tablet Commonly known as: SINGULAIR   nitroGLYCERIN 0.4 MG/SPRAY spray Commonly known as: NITROLINGUAL   pantoprazole 40 MG tablet Commonly known as: PROTONIX   polyvinyl alcohol 1.4 % ophthalmic solution Commonly known as: LIQUIFILM TEARS   Potassium Chloride ER 20 MEQ Tbcr   Saline Soln   triamcinolone cream 0.1 % Commonly known as: KENALOG   Vitamin D3 25  MCG (1000 UT) Caps       TAKE these medications    albuterol (2.5 MG/3ML) 0.083% nebulizer solution Commonly known as: PROVENTIL Take 3 mLs (2.5 mg total) by nebulization every 6 (six) hours as needed for wheezing or shortness of breath. What changed: Another medication with the same name was removed. Continue taking this medication, and follow the directions you see here.   Flovent Diskus 100 MCG/BLIST Aepb Generic drug: Fluticasone Propionate (Inhal) Inhale 2 puffs into the lungs in the morning and at bedtime.   LORazepam 2 MG/ML concentrated solution Commonly known as: ATIVAN Place 0.5 mLs (1 mg total) under the tongue every 4 (four) hours as needed for anxiety.   morphine CONCENTRATE 10 mg / 0.5 ml concentrated solution Take 0.25 mLs (5 mg total) by mouth every 2 (two) hours as needed for moderate pain (pain score 4-6) (or dyspnea).       Allergies  Allergen Reactions   Cefprozil Nausea And Vomiting    Unknown   Demerol Nausea And Vomiting   Meperidine Hcl Other (See Comments)    Unknown      The results of significant diagnostics from this  hospitalization (including imaging, microbiology, ancillary and laboratory) are listed below for reference.    Significant Diagnostic Studies: US RENAL Result Date: 08/05/2023 CLINICAL DATA:  Acute kidney injury EXAM: RENAL / URINARY TRACT ULTRASOUND COMPLETE COMPARISON:  None Available. FINDINGS: Right Kidney: Renal measurements: 12.3 x 5.5 x 4.8 cm = volume: 172.5 mL. No collecting system dilatation or perinephric fluid. There is exophytic anechoic structure measuring 6.8 cm. Margins are somewhat ill-defined but there is overlapping bowel gas and soft tissue. These are more simple on the recent CT scan of 07/24/2023. Left Kidney: Renal measurements: 10.4 x 6.1 x 4.8 cm = volume: 159 mL. No collecting system dilatation or perinephric fluid. There is an echogenic shadowing area in the left kidney measuring 12 mm. Possible stone there  is also some small cystic foci identified but these are mildly complex measuring up to 3.1 cm. Again this could be technical with the overlapping bowel gas and soft tissue. These appeared more simple on the recent CT scan of 07/24/2023. Bladder: Appears normal for degree of bladder distention. Other: None. IMPRESSION: No collecting system dilatation. Left-sided renal stone. Bilateral renal cystic foci are incompletely evaluated with the overlapping bowel gas and soft tissue but appeared more simple on the recent CT scan. Please correlate with prior CT evaluation. Electronically Signed   By: Karen Kays M.D.   On: 08/05/2023 14:36   MR BRAIN WO CONTRAST Result Date: 07/31/2023 CLINICAL DATA:  Mental status change, unknown cause. EXAM: MRI HEAD WITHOUT CONTRAST TECHNIQUE: Multiplanar, multiecho pulse sequences of the brain and surrounding structures were obtained without intravenous contrast. COMPARISON:  Head CT July 24, 2023. FINDINGS: The study is significantly degraded by motion. Brain: No acute infarction, hemorrhage, hydrocephalus, extra-axial collection or mass lesion. Remote right PCA territory infarct. At least mild to moderate chronic ischemic changes of the white matter. Moderate parenchymal volume loss. Vascular: Normal flow voids. Skull and upper cervical spine: No focal lesion identified. Sinuses/Orbits: Mucosal thickening of the paranasal sinuses with fluid level within the sphenoid sinuses. Other: None. IMPRESSION: 1. Significantly motion degraded study. 2. No acute intracranial abnormality identified. 3. Remote right PCA territory infarct. 4. At least mild to moderate chronic ischemic changes of the white matter. 5. Moderate parenchymal volume loss. 6. Paranasal sinus disease. Electronically Signed   By: Baldemar Lenis M.D.   On: 07/31/2023 12:31   DG CHEST PORT 1 VIEW Result Date: 07/30/2023 CLINICAL DATA:  Altered mental status. Shortness of breath. Lethargy. EXAM: PORTABLE  CHEST 1 VIEW COMPARISON:  Chest radiograph dated 07/25/2023. FINDINGS: Right-sided PICC in similar position. Left lung base atelectasis. Right upper lobe streaky densities may represent infiltrate. Small bilateral pleural effusions suspected. No pneumothorax. Stable cardiac silhouette. Atherosclerotic calcification of the aorta. No acute osseous pathology. IMPRESSION: 1. Left lung base atelectasis. 2. Right upper lobe infiltrate. 3. Small bilateral pleural effusions. Electronically Signed   By: Elgie Collard M.D.   On: 07/30/2023 14:15   ECHOCARDIOGRAM COMPLETE Result Date: 07/29/2023    ECHOCARDIOGRAM REPORT   Patient Name:   LAKYA SCHRUPP Date of Exam: 07/29/2023 Medical Rec #:  [id number]      Height:       66.0 in Accession #:    [id number]     Weight:       199.1 lb Date of Birth:  04/13/1941       BSA:          1.996 m Patient Age:    22 years  BP:           147/104 mmHg Patient Gender: F              HR:           120 bpm. Exam Location:  Inpatient Procedure: 2D Echo, Cardiac Doppler and Color Doppler Indications:    A-fib  History:        Patient has prior history of Echocardiogram examinations, most                 recent 12/24/2017. CKD, Arrythmias:Atrial Fibrillation; Risk                 Factors:Hypertension, Diabetes, Dyslipidemia and Former Smoker.  Sonographer:    Dondra Prader RVT Referring Phys: [id number] JAN A MANSY  Sonographer Comments: Technically challenging study due to limited acoustic windows and Technically difficult study due to poor echo windows. Technically difficult due to elevated HR, patient supine, respiratory motion and patients inability to tolerate exam. HR between 120 bpm-140 bpm IMPRESSIONS  1. Left ventricular ejection fraction, by estimation, is 55 to 60%. The left ventricle has normal function. Left ventricular endocardial border not optimally defined to evaluate regional wall motion. There is mild left ventricular hypertrophy. Left ventricular diastolic parameters are  indeterminate.  2. RV not well visualized, grossly appears normal in size and function. . Right ventricular systolic function was not well visualized. The right ventricular size is not well visualized. Tricuspid regurgitation signal is inadequate for assessing PA pressure.  3. The mitral valve was not well visualized. Trivial mitral valve regurgitation. No evidence of mitral stenosis.  4. The aortic valve was not well visualized. There is mild calcification of the aortic valve. There is mild thickening of the aortic valve. Aortic valve regurgitation is not visualized. No aortic stenosis is present.  5. The inferior vena cava is normal in size with greater than 50% respiratory variability, suggesting right atrial pressure of 3 mmHg.  6. Technically difficult study in setting of limited visualization and elevated heart rates. FINDINGS  Left Ventricle: Left ventricular ejection fraction, by estimation, is 55 to 60%. The left ventricle has normal function. Left ventricular endocardial border not optimally defined to evaluate regional wall motion. The left ventricular internal cavity size was normal in size. There is mild left ventricular hypertrophy. Left ventricular diastolic parameters are indeterminate. Right Ventricle: RV not well visualized, grossly appears normal in size and function. The right ventricular size is not well visualized. Right vetricular wall thickness was not well visualized. Right ventricular systolic function was not well visualized.  Tricuspid regurgitation signal is inadequate for assessing PA pressure. Left Atrium: Left atrial size was normal in size. Right Atrium: Right atrial size was normal in size. Pericardium: There is no evidence of pericardial effusion. Mitral Valve: The mitral valve was not well visualized. Trivial mitral valve regurgitation. No evidence of mitral valve stenosis. Tricuspid Valve: The tricuspid valve is not well visualized. Tricuspid valve regurgitation is trivial. No  evidence of tricuspid stenosis. Aortic Valve: The aortic valve was not well visualized. There is mild calcification of the aortic valve. There is mild thickening of the aortic valve. There is mild aortic valve annular calcification. Aortic valve regurgitation is not visualized. No aortic stenosis is present. Aortic valve mean gradient measures 2.8 mmHg. Aortic valve peak gradient measures 5.3 mmHg. Aortic valve area, by VTI measures 2.17 cm. Pulmonic Valve: The pulmonic valve was not well visualized. Pulmonic valve regurgitation is not visualized. No evidence of pulmonic stenosis.  Aorta: The aortic root and ascending aorta are structurally normal, with no evidence of dilitation. Venous: The inferior vena cava is normal in size with greater than 50% respiratory variability, suggesting right atrial pressure of 3 mmHg. IAS/Shunts: No atrial level shunt detected by color flow Doppler.  LEFT VENTRICLE PLAX 2D LVIDd:         4.10 cm LVIDs:         2.80 cm LV PW:         1.20 cm LV IVS:        1.20 cm LVOT diam:     2.00 cm LV SV:         32 LV SV Index:   16 LVOT Area:     3.14 cm  IVC IVC diam: 1.80 cm LEFT ATRIUM           Index LA diam:      3.70 cm 1.85 cm/m LA Vol (A4C): 34.9 ml 17.49 ml/m  AORTIC VALVE                    PULMONIC VALVE AV Area (Vmax):    2.30 cm     PV Vmax:       0.88 m/s AV Area (Vmean):   2.18 cm     PV Peak grad:  3.1 mmHg AV Area (VTI):     2.17 cm AV Vmax:           114.60 cm/s AV Vmean:          79.140 cm/s AV VTI:            0.148 m AV Peak Grad:      5.3 mmHg AV Mean Grad:      2.8 mmHg LVOT Vmax:         83.87 cm/s LVOT Vmean:        54.867 cm/s LVOT VTI:          0.102 m LVOT/AV VTI ratio: 0.69  AORTA Ao Root diam: 2.20 cm Ao Asc diam:  3.40 cm  SHUNTS Systemic VTI:  0.10 m Systemic Diam: 2.00 cm Dina Rich MD Electronically signed by Dina Rich MD Signature Date/Time: 07/29/2023/12:24:49 PM    Final    DG Chest Portable 1 View Result Date: 07/25/2023 CLINICAL DATA:   PICC line placement EXAM: PORTABLE CHEST 1 VIEW COMPARISON:  Chest radiograph and CT chest, abdomen, and pelvis dated 07/24/2023. FINDINGS: Right PICC line tip overlies the lower SVC. Low lung volumes. Stable cardiomegaly. Aortic atherosclerosis. Similar indistinctness of the left costophrenic angle, likely correlates with prominent subpleural fat noted on the prior CT. No focal consolidation, sizeable pleural effusion, or pneumothorax. The visualized osseous structures are unchanged. IMPRESSION: 1. Right PICC line tip overlies the lower SVC. 2. Low lung volumes.  No acute cardiopulmonary findings. Electronically Signed   By: Hart Robinsons M.D.   On: 07/25/2023 13:11   [place] EKG SITE RITE Result Date: 07/25/2023 If Site Rite image not attached, placement could not be confirmed due to current cardiac rhythm.  CT CHEST ABDOMEN PELVIS W CONTRAST Result Date: 07/24/2023 CLINICAL DATA:  Sepsis. Urinary tract infection. Previous appendectomy, hysterectomy, oophorectomy and cholecystectomy. EXAM: CT CHEST, ABDOMEN, AND PELVIS WITH CONTRAST TECHNIQUE: Multidetector CT imaging of the chest, abdomen and pelvis was performed following the standard protocol during bolus administration of intravenous contrast. RADIATION DOSE REDUCTION: This exam was performed according to the departmental dose-optimization program which includes automated exposure control, adjustment of the mA and/or kV  according to patient size and/or use of iterative reconstruction technique. CONTRAST:  OMNIPAQUE IOHEXOL 300 MG/ML  SOLN COMPARISON:  Abdomen and pelvis CT dated 04/24/2018. Portable chest obtained earlier today. Chest CT report dated 05/15/2019. Chest CTA dated 10/18/2008. FINDINGS: CT CHEST FINDINGS Cardiovascular: Stable mildly enlarged heart aortic valve calcifications and dense mitral valve annulus calcifications. Atheromatous calcifications, including the coronary arteries and aorta. The central pulmonary arteries remain mildly  dilated with a main pulmonary artery diameter of 3.4 cm. Mediastinum/Nodes: No enlarged mediastinal, hilar, or axillary lymph nodes. Interval flattening of the trachea and mainstem bronchi with an interval decreased depth of inspiration. Unremarkable thyroid gland and esophagus. Lungs/Pleura: Decreased depth of inspiration. Mild bilateral lower lobe cylindrical bronchiectasis and associated mild parenchymal densities. Prominent subpleural fat on the left causing the blunting of the lateral costophrenic angle seen radiographically. No pleural fluid Musculoskeletal: Thoracic spine degenerative changes multiple thoracic vertebral compression deformities with mild bony retropulsion and no visible acute fracture lines. Associated acute kyphosis in the upper thoracic spine. CT ABDOMEN PELVIS FINDINGS Hepatobiliary: No focal liver abnormality is seen. Status post cholecystectomy. No biliary dilatation. Pancreas: Unremarkable. No pancreatic ductal dilatation or surrounding inflammatory changes. Spleen: Normal in size without focal abnormality. Adrenals/Urinary Tract: Normal-appearing adrenal glands multiple bilateral simple appearing renal cysts. These do not need imaging follow-up unremarkable urinary bladder and ureters Stomach/Bowel: Large number of sigmoid colon diverticula without evidence of diverticulitis. Breathing motion blurring obscuring the detail in the right lower quadrant of the abdomen. There is some fluid in gas in that region of the abdomen that is most likely intraluminal. However, due to the motion artifact, it is difficult to exclude a fluid or gas collection in that area. Surgically absent appendix by history. Unremarkable stomach. Vascular/Lymphatic: Atheromatous arterial calcifications without aneurysm. No enlarged lymph nodes. Reproductive: Status post hysterectomy. No adnexal masses. Other: Anterior subcutaneous edema and anterior subcutaneous air, compatible with recent subcutaneous injection.  Moderate-sized left inguinal hernia containing fat and small to moderate-sized right inguinal hernia containing fat Musculoskeletal: Lumbar spine degenerative changes. Multiple lumbar spine vertebral compression deformities that are new since 04/24/2018. Stable old T12 vertebral compression deformity. Possible acute fracture line in the anterior, superior corner of the L2 vertebral body and possible acute fracture line in the superior endplate of the L4 vertebral body. IMPRESSION: 1. Fluid and gas in the right lower quadrant of the abdomen which is most likely within the bowel. However, this can not be confirmed as intraluminal due to motion artifacts in that area. Correlation with the presence or absence of focal pain and tenderness in that area is recommended to help exclude the possibility of an abscess or contained perforation in the right lower quadrant of the abdomen. 2. Otherwise, no acute abnormality seen. 3. Decreased depth of inspiration with flattening of the trachea and mainstem bronchi, compatible with tracheobronchomalacia. 4. Mild bilateral lower lobe cylindrical bronchiectasis and associated mild parenchymal densities, most likely due to atelectasis. 5. Stable mild cardiomegaly. 6. Mildly dilated central pulmonary arteries, suggesting pulmonary arterial hypertension. 7. Multiple thoracic and lumbar spine vertebral compression deformities, some of which are new since 04/24/2018. Possible acute fracture lines in the anterior, superior corner of the L2 vertebral body and superior endplate of the L4 vertebral body. 8. Sigmoid colon diverticulosis. 9. Moderate-sized left inguinal hernia containing fat and small to moderate-sized right inguinal hernia containing fat. 10. Aortic atherosclerosis. Aortic Atherosclerosis (ICD10-I70.0). Electronically Signed   By: Beckie Salts M.D.   On: 07/24/2023 16:45   CT  Head Wo Contrast Result Date: 07/24/2023 CLINICAL DATA:  Mental status change. Current therapy for  urinary tract infection. EXAM: CT HEAD WITHOUT CONTRAST TECHNIQUE: Contiguous axial images were obtained from the base of the skull through the vertex without intravenous contrast. RADIATION DOSE REDUCTION: This exam was performed according to the departmental dose-optimization program which includes automated exposure control, adjustment of the mA and/or kV according to patient size and/or use of iterative reconstruction technique. COMPARISON:  01/05/2023 FINDINGS: Brain: No evidence of acute infarction, hemorrhage, hydrocephalus, extra-axial collection or mass lesion/mass effect. Vascular: Encephalomalacia within the right occipital and posterior temporal lobes consistent with remote PCA infarct. Ex vacuo dilatation of the right posterolateral ventricle noted. There is mild diffuse low-attenuation within the subcortical and periventricular white matter compatible with chronic microvascular disease. Skull: Normal. Negative for fracture or focal lesion. Sinuses/Orbits: Air-fluid level within the sphenoid sinus. Partial opacification of the ethmoid air cells. Other: None. IMPRESSION: 1. No acute intracranial abnormalities. 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 4. Air-fluid level within the sphenoid sinus. Correlate for any clinical signs or symptoms of acute sinusitis. Electronically Signed   By: Signa Kell M.D.   On: 07/24/2023 16:15   DG Chest Port 1 View Result Date: 07/24/2023 CLINICAL DATA:  Questionable sepsis - evaluate for abnormality. Weakness. Altered mental status. EXAM: PORTABLE CHEST 1 VIEW COMPARISON:  01/05/2023. FINDINGS: Redemonstration of blunting of left lateral costophrenic angle, similar to the prior study, which may represent small left pleural effusion. There are probable associated atelectatic changes at the left lung base. Bilateral lung fields are otherwise clear. No acute consolidation or lung collapse. Right lateral costophrenic angle is clear. Stable  cardio-mediastinal silhouette. No acute osseous abnormalities. The soft tissues are within normal limits. There are surgical clips in the right upper quadrant, typical of a previous cholecystectomy. IMPRESSION: *Persistent blunting of left lateral costophrenic angle, which may represent small left pleural effusion. *Otherwise no acute cardiopulmonary abnormality seen. Electronically Signed   By: Jules Schick M.D.   On: 07/24/2023 14:16    Microbiology: No results found for this or any previous visit (from the past 240 hours).   Labs: Basic Metabolic Panel: Recent Labs  Lab 08/02/23 0613 08/03/23 0406 08/04/23 0409 08/05/23 0442 08/06/23 0507  NA 141 137 139 140 143  K 2.8* 3.2* 3.3* 3.6 3.5  CL 104 101 105 107 108  CO2 28 28 27 25 26   GLUCOSE 82 71 99 80 93  BUN 10 13 15 16 18   CREATININE 0.83 1.63* 2.16* 2.63* 2.57*  CALCIUM 8.4* 8.5* 8.6* 8.6* 8.6*  MG 1.6* 2.0 1.9 1.9  --    Liver Function Tests: No results for input(s): "AST", "ALT", "ALKPHOS", "BILITOT", "PROT", "ALBUMIN" in the last 168 hours. No results for input(s): "LIPASE", "AMYLASE" in the last 168 hours. No results for input(s): "AMMONIA" in the last 168 hours. CBC: No results for input(s): "WBC", "NEUTROABS", "HGB", "HCT", "MCV", "PLT" in the last 168 hours. Cardiac Enzymes: No results for input(s): "CKTOTAL", "CKMB", "CKMBINDEX", "TROPONINI" in the last 168 hours. BNP: BNP (last 3 results) Recent Labs    07/31/23 0955  BNP 279.0*    ProBNP (last 3 results) No results for input(s): "PROBNP" in the last 8760 hours.  CBG: Recent Labs  Lab 08/06/23 0000 08/06/23 0356 08/06/23 0730 08/06/23 1117 08/06/23 1639  GLUCAP 86 87 106* 100* 101*    Signed:  Rhetta Mura MD   [facility] 08/08/2023, 1:33 PM

## 2023-08-08 NOTE — Progress Notes (Deleted)
GIP admit  History of present illness:  83 year old white female resident of [facility] skilled X5 years Paroxysmal A-fib CHADVASC >4, CAD with EMS and DES to LAD in 2004 and repeat intervention 2005 Previous tonsil abscess DM TY 2 Prior pulmonary embolism Dementia with dysphagia dysmotility   Events 07/24/2023 presented altered mental status-- increasing lethargy in the setting of recent treatment for UTI with antibiotics-CT head negative CT abdomen pelvis?  Right lower quadrant gas collection in bowels?  Perforated fluid collection Respiratory viral panel positive for H influenza 1/31 General Surgery consulted and felt that motion artifact was causing this and did not feel that patient needed intra-abdominal antibiotic coverage and was started on clear liquids 2/3 palliative medicine consulted for goals of care 2/4 developed A-fib RVR cardiology consulted started on Cardizem Gtt   procedures 2/6 MR brain significantly motion degraded study no acute intracranial abnormality remote right PCA infarct 2/11 renal US  no collecting system dilatation left-sided renal stone bilateral renal cystic foci incompletely evaluated   Hospital Course:  The patient had acute metabolic encephalopathy on admission possibly secondary to iatrogenic meds versus superimposed infection with pneumonia and pneumonitis I do not think she had sepsis on admission She was kept on IV antibiotics completed Tamiflu completed IV Zosyn and was downgraded to dysphagia 1 pured thin liquids because of high risk for aspiration There was concern about a possible right lower quadrant fluid collection but general surgery saw this and did not feel that this was a real pathology felt that this was shadowing of gas Because of her nonoliguric AKI secondary to Zosyn/vancomycin nephrology was consulted-we did a trial of IV fluids and her p.o. intake was still not able to keep up with her insensible losses and hence it was felt that  with encephalopathy as well as her AKI she is not going to function or do well  Multiple conversations undertaken with patient's brother who lives in [place] by Dr.Tat  He was interested in patient going to hospice facility at [facility] but they do not have a bed available Patient is admitted under GIP status for in-hospital hospice death  She was At time of my arrival and will review She is breathing a little bit deeply but does not seem uncomfortable Her left arm is not swollen her right has a hematoma S1-S2 no murmur She has chronic mild lower extremity edema  P Comfort orders with haloperidol lorazepam Robinul and morphine sublingually We can use nebs Pulmicort as needed Continue air mattress I do not anticipate she will survive beyond the next 48 hours and will be an in-hospital death We will review periodically but nursing may pronounce her if she passes overngiht

## 2023-08-08 NOTE — Plan of Care (Signed)
  Problem: Activity: Goal: Risk for activity intolerance will decrease Outcome: Not Progressing   Problem: Education: Goal: Knowledge of General Education information will improve Description: Including pain rating scale, medication(s)/side effects and non-pharmacologic comfort measures Outcome: Not Applicable   Problem: Clinical Measurements: Goal: Respiratory complications will improve Outcome: Not Applicable   Problem: Nutrition: Goal: Adequate nutrition will be maintained Outcome: Not Applicable

## 2023-08-08 NOTE — TOC Progression Note (Signed)
Transition of Care [facility]) - Progression Note    Patient Details  Name: [name] MRN: [id number] Date of Birth: 1941-02-10  [facility] CM/SW Contact  Karn Cassis, [place] Phone Number: 08/08/2023, 2:56 PM  Clinical Narrative: [facility] assessed pt and have completed GIP paperwork with family as no bed available at [facility] today. MD notified to flip chart.            Expected Discharge Plan and Services                                               Social Determinants of Health (SDOH) Interventions SDOH Screenings   Food Insecurity: No Food Insecurity (07/25/2023)  Housing: Patient Unable To Answer (07/25/2023)  Transportation Needs: Patient Unable To Answer (07/25/2023)  Utilities: Not At Risk (07/25/2023)  Social Connections: Patient Unable To Answer (07/25/2023)  Tobacco Use: Medium Risk (07/28/2023)    Readmission Risk Interventions    07/25/2023   12:54 PM  Readmission Risk Prevention Plan  Transportation Screening Complete  HRI or Home Care Consult Complete  Social Work Consult for Recovery Care Planning/Counseling Complete  Palliative Care Screening Not Applicable  Medication Review [employer]) Complete

## 2023-08-09 MED ORDER — MORPHINE SULFATE (CONCENTRATE) 10 MG /0.5 ML PO SOLN: 5.0000 mg | ORAL | Status: AC | PRN

## 2023-08-09 MED ORDER — LORAZEPAM 2 MG/ML PO CONC
1.0000 mg | ORAL | Status: AC | PRN
Start: 2023-08-09 — End: ?

## 2023-08-09 NOTE — Discharge Summary (Signed)
Physician Discharge Summary  [name] DOB: 1941/04/09 DOA: 08/08/2023  PCP: Merlyn Albert, MD (Inactive)  Admit date: 08/08/2023 Discharge date: 08/09/2023  Time spent: 10 minutes  Recommendations for Outpatient Follow-up:  Discharging to [facility] house hospice for eol care  Discharge Diagnoses:  MAIN problem for hospitalization   Toxic metabolic encephalopathy on admission secondary to iatrogenic medications with superimposed pneumonitis pneumonia  Please see below for itemized issues addressed in HOpsital- refer to other progress notes for clarity if needed  Discharge Condition: Guarded  Diet recommendation: Comfort  There were no vitals filed for this visit.   History of present illness:  83 year old white female resident of [facility] skilled X5 years Paroxysmal A-fib CHADVASC >4, CAD with EMS and DES to LAD in 2004 and repeat intervention 2005 Previous tonsil abscess DM TY 2 Prior pulmonary embolism Dementia with dysphagia dysmotility   Events 07/24/2023 presented altered mental status-- increasing lethargy in the setting of recent treatment for UTI with antibiotics-CT head negative CT abdomen pelvis?  Right lower quadrant gas collection in bowels?  Perforated fluid collection Respiratory viral panel positive for H influenza 1/31 General Surgery consulted and felt that motion artifact was causing this and did not feel that patient needed intra-abdominal antibiotic coverage and was started on clear liquids 2/3 palliative medicine consulted for goals of care 2/4 developed A-fib RVR cardiology consulted started on Cardizem Gtt   procedures 2/6 MR brain significantly motion degraded study no acute intracranial abnormality remote right PCA infarct 2/11 renal US  no collecting system dilatation left-sided renal stone bilateral renal cystic foci incompletely evaluated  Hospital Course:  The patient had acute metabolic encephalopathy on admission  possibly secondary to iatrogenic meds versus superimposed infection with pneumonia and pneumonitis I do not think she had sepsis on admission She was kept on IV antibiotics completed Tamiflu completed IV Zosyn and was downgraded to dysphagia 1 pured thin liquids because of high risk for aspiration There was concern about a possible right lower quadrant fluid collection but general surgery saw this and did not feel that this was a real pathology felt that this was shadowing of gas Because of her nonoliguric AKI secondary to Zosyn/vancomycin nephrology was consulted-we did a trial of IV fluids and her p.o. intake was still not able to keep up with her insensible losses and hence it was felt that with encephalopathy as well as her AKI she is not going to function or do well Multiple conversations undertaken with patient's brother who lives in [place] by Dr. Merri Ray am also followed up with with the brother after palliative care had discussed the same with him several times patient was not advancing and we had a meaningful discussion about goals of care and he elected to pursue comfort care --we have discontinued all nonessential meds once again inclusive of Eliquis for history of PE as well as aspirin and other anxiolytics and psychiatric meds Now on minimal meds --mainly ones related to comfort such as morphine and Ativan we will discontinue all other nonessential meds at this time  Patient was admitted under GIP by Hospice and was then transitioned to Hospice house at discharge on 2/15  Discharge Exam: Vitals:   08/09/23 1222  BP: 106/76  Pulse: (!) 54  Resp: 18  Temp: 97.7 F (36.5 C)  SpO2: 92%    Subj on day of d/c  Incoherent, pupils slight dilated, slight tachypnea, no distress Chest clear anteriorly no wheeze Abdomen is soft no rebound no gaurding  ROM is intact grossly   Discharge Instructions     Allergies as of 08/09/2023       Reactions   Cefprozil Nausea And Vomiting    Unknown   Demerol Nausea And Vomiting   Meperidine Hcl Other (See Comments)   Unknown        Medication List     TAKE these medications    albuterol (2.5 MG/3ML) 0.083% nebulizer solution Commonly known as: PROVENTIL Take 3 mLs (2.5 mg total) by nebulization every 6 (six) hours as needed for wheezing or shortness of breath.   Flovent Diskus 100 MCG/BLIST Aepb Generic drug: Fluticasone Propionate (Inhal) Inhale 2 puffs into the lungs in the morning and at bedtime.   LORazepam 2 MG/ML concentrated solution Commonly known as: ATIVAN Place 0.5 mLs (1 mg total) under the tongue every 4 (four) hours as needed for anxiety.   morphine CONCENTRATE 10 mg / 0.5 ml concentrated solution Take 0.25 mLs (5 mg total) by mouth every 2 (two) hours as needed for moderate pain (pain score 4-6) (or dyspnea).        Allergies  Allergen Reactions   Cefprozil Nausea And Vomiting    Unknown   Demerol Nausea And Vomiting   Meperidine Hcl Other (See Comments)    Unknown      The results of significant diagnostics from this hospitalization (including imaging, microbiology, ancillary and laboratory) are listed below for reference.    Significant Diagnostic Studies: US RENAL Result Date: 08/05/2023 CLINICAL DATA:  Acute kidney injury EXAM: RENAL / URINARY TRACT ULTRASOUND COMPLETE COMPARISON:  None Available. FINDINGS: Right Kidney: Renal measurements: 12.3 x 5.5 x 4.8 cm = volume: 172.5 mL. No collecting system dilatation or perinephric fluid. There is exophytic anechoic structure measuring 6.8 cm. Margins are somewhat ill-defined but there is overlapping bowel gas and soft tissue. These are more simple on the recent CT scan of 07/24/2023. Left Kidney: Renal measurements: 10.4 x 6.1 x 4.8 cm = volume: 159 mL. No collecting system dilatation or perinephric fluid. There is an echogenic shadowing area in the left kidney measuring 12 mm. Possible stone there is also some small cystic foci identified  but these are mildly complex measuring up to 3.1 cm. Again this could be technical with the overlapping bowel gas and soft tissue. These appeared more simple on the recent CT scan of 07/24/2023. Bladder: Appears normal for degree of bladder distention. Other: None. IMPRESSION: No collecting system dilatation. Left-sided renal stone. Bilateral renal cystic foci are incompletely evaluated with the overlapping bowel gas and soft tissue but appeared more simple on the recent CT scan. Please correlate with prior CT evaluation. Electronically Signed   By: Karen Kays M.D.   On: 08/05/2023 14:36   MR BRAIN WO CONTRAST Result Date: 07/31/2023 CLINICAL DATA:  Mental status change, unknown cause. EXAM: MRI HEAD WITHOUT CONTRAST TECHNIQUE: Multiplanar, multiecho pulse sequences of the brain and surrounding structures were obtained without intravenous contrast. COMPARISON:  Head CT July 24, 2023. FINDINGS: The study is significantly degraded by motion. Brain: No acute infarction, hemorrhage, hydrocephalus, extra-axial collection or mass lesion. Remote right PCA territory infarct. At least mild to moderate chronic ischemic changes of the white matter. Moderate parenchymal volume loss. Vascular: Normal flow voids. Skull and upper cervical spine: No focal lesion identified. Sinuses/Orbits: Mucosal thickening of the paranasal sinuses with fluid level within the sphenoid sinuses. Other: None. IMPRESSION: 1. Significantly motion degraded study. 2. No acute intracranial abnormality identified. 3. Remote right PCA territory infarct.  4. At least mild to moderate chronic ischemic changes of the white matter. 5. Moderate parenchymal volume loss. 6. Paranasal sinus disease. Electronically Signed   By: Baldemar Lenis M.D.   On: 07/31/2023 12:31   DG CHEST PORT 1 VIEW Result Date: 07/30/2023 CLINICAL DATA:  Altered mental status. Shortness of breath. Lethargy. EXAM: PORTABLE CHEST 1 VIEW COMPARISON:  Chest radiograph  dated 07/25/2023. FINDINGS: Right-sided PICC in similar position. Left lung base atelectasis. Right upper lobe streaky densities may represent infiltrate. Small bilateral pleural effusions suspected. No pneumothorax. Stable cardiac silhouette. Atherosclerotic calcification of the aorta. No acute osseous pathology. IMPRESSION: 1. Left lung base atelectasis. 2. Right upper lobe infiltrate. 3. Small bilateral pleural effusions. Electronically Signed   By: Elgie Collard M.D.   On: 07/30/2023 14:15   ECHOCARDIOGRAM COMPLETE Result Date: 07/29/2023    ECHOCARDIOGRAM REPORT   Patient Name:   KALLEN DELATORRE Date of Exam: 07/29/2023 Medical Rec #:  [id number]      Height:       66.0 in Accession #:    [id number]     Weight:       199.1 lb Date of Birth:  15-Jun-1941       BSA:          1.996 m Patient Age:    82 years       BP:           147/104 mmHg Patient Gender: F              HR:           120 bpm. Exam Location:  Inpatient Procedure: 2D Echo, Cardiac Doppler and Color Doppler Indications:    A-fib  History:        Patient has prior history of Echocardiogram examinations, most                 recent 12/24/2017. CKD, Arrythmias:Atrial Fibrillation; Risk                 Factors:Hypertension, Diabetes, Dyslipidemia and Former Smoker.  Sonographer:    Dondra Prader RVT Referring Phys: [id number] JAN A MANSY  Sonographer Comments: Technically challenging study due to limited acoustic windows and Technically difficult study due to poor echo windows. Technically difficult due to elevated HR, patient supine, respiratory motion and patients inability to tolerate exam. HR between 120 bpm-140 bpm IMPRESSIONS  1. Left ventricular ejection fraction, by estimation, is 55 to 60%. The left ventricle has normal function. Left ventricular endocardial border not optimally defined to evaluate regional wall motion. There is mild left ventricular hypertrophy. Left ventricular diastolic parameters are indeterminate.  2. RV not well visualized,  grossly appears normal in size and function. . Right ventricular systolic function was not well visualized. The right ventricular size is not well visualized. Tricuspid regurgitation signal is inadequate for assessing PA pressure.  3. The mitral valve was not well visualized. Trivial mitral valve regurgitation. No evidence of mitral stenosis.  4. The aortic valve was not well visualized. There is mild calcification of the aortic valve. There is mild thickening of the aortic valve. Aortic valve regurgitation is not visualized. No aortic stenosis is present.  5. The inferior vena cava is normal in size with greater than 50% respiratory variability, suggesting right atrial pressure of 3 mmHg.  6. Technically difficult study in setting of limited visualization and elevated heart rates. FINDINGS  Left Ventricle: Left ventricular ejection fraction, by estimation, is 55 to 60%. The  left ventricle has normal function. Left ventricular endocardial border not optimally defined to evaluate regional wall motion. The left ventricular internal cavity size was normal in size. There is mild left ventricular hypertrophy. Left ventricular diastolic parameters are indeterminate. Right Ventricle: RV not well visualized, grossly appears normal in size and function. The right ventricular size is not well visualized. Right vetricular wall thickness was not well visualized. Right ventricular systolic function was not well visualized.  Tricuspid regurgitation signal is inadequate for assessing PA pressure. Left Atrium: Left atrial size was normal in size. Right Atrium: Right atrial size was normal in size. Pericardium: There is no evidence of pericardial effusion. Mitral Valve: The mitral valve was not well visualized. Trivial mitral valve regurgitation. No evidence of mitral valve stenosis. Tricuspid Valve: The tricuspid valve is not well visualized. Tricuspid valve regurgitation is trivial. No evidence of tricuspid stenosis. Aortic Valve:  The aortic valve was not well visualized. There is mild calcification of the aortic valve. There is mild thickening of the aortic valve. There is mild aortic valve annular calcification. Aortic valve regurgitation is not visualized. No aortic stenosis is present. Aortic valve mean gradient measures 2.8 mmHg. Aortic valve peak gradient measures 5.3 mmHg. Aortic valve area, by VTI measures 2.17 cm. Pulmonic Valve: The pulmonic valve was not well visualized. Pulmonic valve regurgitation is not visualized. No evidence of pulmonic stenosis. Aorta: The aortic root and ascending aorta are structurally normal, with no evidence of dilitation. Venous: The inferior vena cava is normal in size with greater than 50% respiratory variability, suggesting right atrial pressure of 3 mmHg. IAS/Shunts: No atrial level shunt detected by color flow Doppler.  LEFT VENTRICLE PLAX 2D LVIDd:         4.10 cm LVIDs:         2.80 cm LV PW:         1.20 cm LV IVS:        1.20 cm LVOT diam:     2.00 cm LV SV:         32 LV SV Index:   16 LVOT Area:     3.14 cm  IVC IVC diam: 1.80 cm LEFT ATRIUM           Index LA diam:      3.70 cm 1.85 cm/m LA Vol (A4C): 34.9 ml 17.49 ml/m  AORTIC VALVE                    PULMONIC VALVE AV Area (Vmax):    2.30 cm     PV Vmax:       0.88 m/s AV Area (Vmean):   2.18 cm     PV Peak grad:  3.1 mmHg AV Area (VTI):     2.17 cm AV Vmax:           114.60 cm/s AV Vmean:          79.140 cm/s AV VTI:            0.148 m AV Peak Grad:      5.3 mmHg AV Mean Grad:      2.8 mmHg LVOT Vmax:         83.87 cm/s LVOT Vmean:        54.867 cm/s LVOT VTI:          0.102 m LVOT/AV VTI ratio: 0.69  AORTA Ao Root diam: 2.20 cm Ao Asc diam:  3.40 cm  SHUNTS Systemic VTI:  0.10 m Systemic Diam: 2.00  cm Dina Rich MD Electronically signed by Dina Rich MD Signature Date/Time: 07/29/2023/12:24:49 PM    Final    DG Chest Portable 1 View Result Date: 07/25/2023 CLINICAL DATA:  PICC line placement EXAM: PORTABLE CHEST 1 VIEW  COMPARISON:  Chest radiograph and CT chest, abdomen, and pelvis dated 07/24/2023. FINDINGS: Right PICC line tip overlies the lower SVC. Low lung volumes. Stable cardiomegaly. Aortic atherosclerosis. Similar indistinctness of the left costophrenic angle, likely correlates with prominent subpleural fat noted on the prior CT. No focal consolidation, sizeable pleural effusion, or pneumothorax. The visualized osseous structures are unchanged. IMPRESSION: 1. Right PICC line tip overlies the lower SVC. 2. Low lung volumes.  No acute cardiopulmonary findings. Electronically Signed   By: Hart Robinsons M.D.   On: 07/25/2023 13:11   [place] EKG SITE RITE Result Date: 07/25/2023 If Site Rite image not attached, placement could not be confirmed due to current cardiac rhythm.  CT CHEST ABDOMEN PELVIS W CONTRAST Result Date: 07/24/2023 CLINICAL DATA:  Sepsis. Urinary tract infection. Previous appendectomy, hysterectomy, oophorectomy and cholecystectomy. EXAM: CT CHEST, ABDOMEN, AND PELVIS WITH CONTRAST TECHNIQUE: Multidetector CT imaging of the chest, abdomen and pelvis was performed following the standard protocol during bolus administration of intravenous contrast. RADIATION DOSE REDUCTION: This exam was performed according to the departmental dose-optimization program which includes automated exposure control, adjustment of the mA and/or kV according to patient size and/or use of iterative reconstruction technique. CONTRAST:  OMNIPAQUE IOHEXOL 300 MG/ML  SOLN COMPARISON:  Abdomen and pelvis CT dated 04/24/2018. Portable chest obtained earlier today. Chest CT report dated 05/15/2019. Chest CTA dated 10/18/2008. FINDINGS: CT CHEST FINDINGS Cardiovascular: Stable mildly enlarged heart aortic valve calcifications and dense mitral valve annulus calcifications. Atheromatous calcifications, including the coronary arteries and aorta. The central pulmonary arteries remain mildly dilated with a main pulmonary artery diameter of  3.4 cm. Mediastinum/Nodes: No enlarged mediastinal, hilar, or axillary lymph nodes. Interval flattening of the trachea and mainstem bronchi with an interval decreased depth of inspiration. Unremarkable thyroid gland and esophagus. Lungs/Pleura: Decreased depth of inspiration. Mild bilateral lower lobe cylindrical bronchiectasis and associated mild parenchymal densities. Prominent subpleural fat on the left causing the blunting of the lateral costophrenic angle seen radiographically. No pleural fluid Musculoskeletal: Thoracic spine degenerative changes multiple thoracic vertebral compression deformities with mild bony retropulsion and no visible acute fracture lines. Associated acute kyphosis in the upper thoracic spine. CT ABDOMEN PELVIS FINDINGS Hepatobiliary: No focal liver abnormality is seen. Status post cholecystectomy. No biliary dilatation. Pancreas: Unremarkable. No pancreatic ductal dilatation or surrounding inflammatory changes. Spleen: Normal in size without focal abnormality. Adrenals/Urinary Tract: Normal-appearing adrenal glands multiple bilateral simple appearing renal cysts. These do not need imaging follow-up unremarkable urinary bladder and ureters Stomach/Bowel: Large number of sigmoid colon diverticula without evidence of diverticulitis. Breathing motion blurring obscuring the detail in the right lower quadrant of the abdomen. There is some fluid in gas in that region of the abdomen that is most likely intraluminal. However, due to the motion artifact, it is difficult to exclude a fluid or gas collection in that area. Surgically absent appendix by history. Unremarkable stomach. Vascular/Lymphatic: Atheromatous arterial calcifications without aneurysm. No enlarged lymph nodes. Reproductive: Status post hysterectomy. No adnexal masses. Other: Anterior subcutaneous edema and anterior subcutaneous air, compatible with recent subcutaneous injection. Moderate-sized left inguinal hernia containing fat  and small to moderate-sized right inguinal hernia containing fat Musculoskeletal: Lumbar spine degenerative changes. Multiple lumbar spine vertebral compression deformities that are new since 04/24/2018. Stable old  T12 vertebral compression deformity. Possible acute fracture line in the anterior, superior corner of the L2 vertebral body and possible acute fracture line in the superior endplate of the L4 vertebral body. IMPRESSION: 1. Fluid and gas in the right lower quadrant of the abdomen which is most likely within the bowel. However, this can not be confirmed as intraluminal due to motion artifacts in that area. Correlation with the presence or absence of focal pain and tenderness in that area is recommended to help exclude the possibility of an abscess or contained perforation in the right lower quadrant of the abdomen. 2. Otherwise, no acute abnormality seen. 3. Decreased depth of inspiration with flattening of the trachea and mainstem bronchi, compatible with tracheobronchomalacia. 4. Mild bilateral lower lobe cylindrical bronchiectasis and associated mild parenchymal densities, most likely due to atelectasis. 5. Stable mild cardiomegaly. 6. Mildly dilated central pulmonary arteries, suggesting pulmonary arterial hypertension. 7. Multiple thoracic and lumbar spine vertebral compression deformities, some of which are new since 04/24/2018. Possible acute fracture lines in the anterior, superior corner of the L2 vertebral body and superior endplate of the L4 vertebral body. 8. Sigmoid colon diverticulosis. 9. Moderate-sized left inguinal hernia containing fat and small to moderate-sized right inguinal hernia containing fat. 10. Aortic atherosclerosis. Aortic Atherosclerosis (ICD10-I70.0). Electronically Signed   By: Beckie Salts M.D.   On: 07/24/2023 16:45   CT Head Wo Contrast Result Date: 07/24/2023 CLINICAL DATA:  Mental status change. Current therapy for urinary tract infection. EXAM: CT HEAD WITHOUT  CONTRAST TECHNIQUE: Contiguous axial images were obtained from the base of the skull through the vertex without intravenous contrast. RADIATION DOSE REDUCTION: This exam was performed according to the departmental dose-optimization program which includes automated exposure control, adjustment of the mA and/or kV according to patient size and/or use of iterative reconstruction technique. COMPARISON:  01/05/2023 FINDINGS: Brain: No evidence of acute infarction, hemorrhage, hydrocephalus, extra-axial collection or mass lesion/mass effect. Vascular: Encephalomalacia within the right occipital and posterior temporal lobes consistent with remote PCA infarct. Ex vacuo dilatation of the right posterolateral ventricle noted. There is mild diffuse low-attenuation within the subcortical and periventricular white matter compatible with chronic microvascular disease. Skull: Normal. Negative for fracture or focal lesion. Sinuses/Orbits: Air-fluid level within the sphenoid sinus. Partial opacification of the ethmoid air cells. Other: None. IMPRESSION: 1. No acute intracranial abnormalities. 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 4. Air-fluid level within the sphenoid sinus. Correlate for any clinical signs or symptoms of acute sinusitis. Electronically Signed   By: Signa Kell M.D.   On: 07/24/2023 16:15   DG Chest Port 1 View Result Date: 07/24/2023 CLINICAL DATA:  Questionable sepsis - evaluate for abnormality. Weakness. Altered mental status. EXAM: PORTABLE CHEST 1 VIEW COMPARISON:  01/05/2023. FINDINGS: Redemonstration of blunting of left lateral costophrenic angle, similar to the prior study, which may represent small left pleural effusion. There are probable associated atelectatic changes at the left lung base. Bilateral lung fields are otherwise clear. No acute consolidation or lung collapse. Right lateral costophrenic angle is clear. Stable cardio-mediastinal silhouette. No acute osseous  abnormalities. The soft tissues are within normal limits. There are surgical clips in the right upper quadrant, typical of a previous cholecystectomy. IMPRESSION: *Persistent blunting of left lateral costophrenic angle, which may represent small left pleural effusion. *Otherwise no acute cardiopulmonary abnormality seen. Electronically Signed   By: Jules Schick M.D.   On: 07/24/2023 14:16    Microbiology: No results found for this or any previous visit (from  the past 240 hours).   Labs: Basic Metabolic Panel: Recent Labs  Lab 08/03/23 0406 08/04/23 0409 08/05/23 0442 08/06/23 0507  NA 137 139 140 143  K 3.2* 3.3* 3.6 3.5  CL 101 105 107 108  CO2 28 27 25 26   GLUCOSE 71 99 80 93  BUN 13 15 16 18   CREATININE 1.63* 2.16* 2.63* 2.57*  CALCIUM 8.5* 8.6* 8.6* 8.6*  MG 2.0 1.9 1.9  --    Liver Function Tests: No results for input(s): "AST", "ALT", "ALKPHOS", "BILITOT", "PROT", "ALBUMIN" in the last 168 hours. No results for input(s): "LIPASE", "AMYLASE" in the last 168 hours. No results for input(s): "AMMONIA" in the last 168 hours. CBC: No results for input(s): "WBC", "NEUTROABS", "HGB", "HCT", "MCV", "PLT" in the last 168 hours. Cardiac Enzymes: No results for input(s): "CKTOTAL", "CKMB", "CKMBINDEX", "TROPONINI" in the last 168 hours. BNP: BNP (last 3 results) Recent Labs    07/31/23 0955  BNP 279.0*    ProBNP (last 3 results) No results for input(s): "PROBNP" in the last 8760 hours.  CBG: Recent Labs  Lab 08/06/23 0000 08/06/23 0356 08/06/23 0730 08/06/23 1117 08/06/23 1639  GLUCAP 86 87 106* 100* 101*    Signed:  Rhetta Mura MD   [facility] 08/09/2023, 2:22 PM

## 2023-08-09 NOTE — Progress Notes (Signed)
Report given to nurse Aundra Millet at [facility].

## 2023-08-09 NOTE — Progress Notes (Signed)
Medical transport team here to transport pt to [facility]. Alert to pain. Pt suctioned. Pt transported on 1 L O2. Picc removed by [facility] Heather G. RN

## 2023-08-09 NOTE — Plan of Care (Signed)
  Problem: Education: Goal: Knowledge of General Education information will improve Description: Including pain rating scale, medication(s)/side effects and non-pharmacologic comfort measures Outcome: Not Applicable   Problem: Health Behavior/Discharge Planning: Goal: Ability to manage health-related needs will improve Outcome: Not Applicable   Problem: Activity: Goal: Risk for activity intolerance will decrease Outcome: Not Applicable   Problem: Nutrition: Goal: Adequate nutrition will be maintained Outcome: Not Applicable   Problem: Coping: Goal: Level of anxiety will decrease Outcome: Not Applicable

## 2023-08-09 NOTE — TOC Transition Note (Signed)
Transition of Care [facility]) - Discharge Note   Patient Details  Name: [name] MRN: [id number] Date of Birth: 11-Sep-1940  Transition of Care [facility]) CM/SW Contact:  Elliot Gault, LCSW Phone Number: 08/09/2023, 2:44 PM   Clinical Narrative:     Notified that bed available at [facility] and pt can dc.  [facility] called for transport. RN to call report.  No other TOC needs.  Final next level of care: [facility] Barriers to Discharge: Barriers Resolved   Patient Goals and CMS Choice            Discharge Placement                       Discharge Plan and Services Additional resources added to the After Visit Summary for                                       Social Drivers of Health (SDOH) Interventions SDOH Screenings   Food Insecurity: Patient Unable To Answer (08/08/2023)  Housing: Patient Unable To Answer (08/08/2023)  Transportation Needs: Patient Unable To Answer (08/08/2023)  Utilities: Not At Risk (07/25/2023)  Social Connections: Patient Unable To Answer (08/08/2023)  Tobacco Use: Medium Risk (07/28/2023)     Readmission Risk Interventions    07/25/2023   12:54 PM  Readmission Risk Prevention Plan  Transportation Screening Complete  HRI or Home Care Consult Complete  Social Work Consult for Recovery Care Planning/Counseling Complete  Palliative Care Screening Not Applicable  Medication Review Oceanographer) Complete
